# Patient Record
Sex: Male | Born: 1955 | Race: White | Hispanic: No | Smoking: Current every day smoker
Health system: Southern US, Community
[De-identification: ages and names within clinical notes are randomized; demographics above are authoritative.]

## PROBLEM LIST (undated history)

## (undated) DIAGNOSIS — E669 Obesity, unspecified: Secondary | ICD-10-CM

## (undated) DIAGNOSIS — Z720 Tobacco use: Secondary | ICD-10-CM

## (undated) DIAGNOSIS — I219 Acute myocardial infarction, unspecified: Secondary | ICD-10-CM

## (undated) DIAGNOSIS — R350 Frequency of micturition: Secondary | ICD-10-CM

## (undated) DIAGNOSIS — M549 Dorsalgia, unspecified: Secondary | ICD-10-CM

## (undated) DIAGNOSIS — Z8701 Personal history of pneumonia (recurrent): Secondary | ICD-10-CM

## (undated) DIAGNOSIS — J449 Chronic obstructive pulmonary disease, unspecified: Secondary | ICD-10-CM

## (undated) DIAGNOSIS — I10 Essential (primary) hypertension: Secondary | ICD-10-CM

## (undated) DIAGNOSIS — IMO0001 Reserved for inherently not codable concepts without codable children: Secondary | ICD-10-CM

## (undated) DIAGNOSIS — C4491 Basal cell carcinoma of skin, unspecified: Secondary | ICD-10-CM

## (undated) DIAGNOSIS — Z95810 Presence of automatic (implantable) cardiac defibrillator: Secondary | ICD-10-CM

## (undated) DIAGNOSIS — Z951 Presence of aortocoronary bypass graft: Secondary | ICD-10-CM

## (undated) DIAGNOSIS — E785 Hyperlipidemia, unspecified: Secondary | ICD-10-CM

## (undated) DIAGNOSIS — M109 Gout, unspecified: Secondary | ICD-10-CM

## (undated) DIAGNOSIS — I2510 Atherosclerotic heart disease of native coronary artery without angina pectoris: Secondary | ICD-10-CM

## (undated) DIAGNOSIS — F419 Anxiety disorder, unspecified: Secondary | ICD-10-CM

## (undated) DIAGNOSIS — I314 Cardiac tamponade: Secondary | ICD-10-CM

## (undated) DIAGNOSIS — I712 Thoracic aortic aneurysm, without rupture: Secondary | ICD-10-CM

## (undated) DIAGNOSIS — I255 Ischemic cardiomyopathy: Secondary | ICD-10-CM

## (undated) DIAGNOSIS — I5021 Acute systolic (congestive) heart failure: Secondary | ICD-10-CM

## (undated) DIAGNOSIS — I714 Abdominal aortic aneurysm, without rupture, unspecified: Secondary | ICD-10-CM

## (undated) HISTORY — DX: Ischemic cardiomyopathy: I25.5

## (undated) HISTORY — DX: Essential (primary) hypertension: I10

## (undated) HISTORY — DX: Acute systolic (congestive) heart failure: I50.21

## (undated) HISTORY — PX: COLONOSCOPY: SHX174

## (undated) HISTORY — PX: FOOT SURGERY: SHX648

## (undated) HISTORY — DX: Hyperlipidemia, unspecified: E78.5

## (undated) HISTORY — PX: CARDIAC CATHETERIZATION: SHX172

## (undated) HISTORY — DX: Atherosclerotic heart disease of native coronary artery without angina pectoris: I25.10

---

## 1998-12-04 HISTORY — PX: CORONARY ANGIOPLASTY: SHX604

## 1998-12-12 ENCOUNTER — Inpatient Hospital Stay (HOSPITAL_COMMUNITY): Admission: EM | Admit: 1998-12-12 | Discharge: 1998-12-15 | Payer: Self-pay | Admitting: Emergency Medicine

## 2013-12-04 HISTORY — PX: CORONARY ANGIOPLASTY WITH STENT PLACEMENT: SHX49

## 2013-12-11 ENCOUNTER — Emergency Department (HOSPITAL_COMMUNITY): Payer: Medicaid Other

## 2013-12-11 ENCOUNTER — Inpatient Hospital Stay (HOSPITAL_COMMUNITY)
Admission: EM | Admit: 2013-12-11 | Discharge: 2013-12-14 | DRG: 246 | Disposition: A | Discharge status: Home / Self Care | Payer: Medicaid Other | Attending: Cardiology | Admitting: Cardiology

## 2013-12-11 ENCOUNTER — Encounter (HOSPITAL_COMMUNITY): Payer: Self-pay | Admitting: Cardiology

## 2013-12-11 ENCOUNTER — Inpatient Hospital Stay (HOSPITAL_COMMUNITY): Payer: Medicaid Other

## 2013-12-11 ENCOUNTER — Encounter (HOSPITAL_COMMUNITY)
Admission: EM | Disposition: A | Discharge status: Home / Self Care | Payer: Self-pay | Source: Home / Self Care | Attending: Cardiology

## 2013-12-11 DIAGNOSIS — T50995A Adverse effect of other drugs, medicaments and biological substances, initial encounter: Secondary | ICD-10-CM | POA: Diagnosis not present

## 2013-12-11 DIAGNOSIS — R079 Chest pain, unspecified: Secondary | ICD-10-CM | POA: Diagnosis present

## 2013-12-11 DIAGNOSIS — N183 Chronic kidney disease, stage 3 unspecified: Secondary | ICD-10-CM

## 2013-12-11 DIAGNOSIS — N179 Acute kidney failure, unspecified: Secondary | ICD-10-CM | POA: Diagnosis not present

## 2013-12-11 DIAGNOSIS — I213 ST elevation (STEMI) myocardial infarction of unspecified site: Secondary | ICD-10-CM

## 2013-12-11 DIAGNOSIS — I2589 Other forms of chronic ischemic heart disease: Secondary | ICD-10-CM | POA: Diagnosis present

## 2013-12-11 DIAGNOSIS — I509 Heart failure, unspecified: Secondary | ICD-10-CM | POA: Diagnosis present

## 2013-12-11 DIAGNOSIS — Y9210 Unspecified residential institution as the place of occurrence of the external cause: Secondary | ICD-10-CM | POA: Diagnosis not present

## 2013-12-11 DIAGNOSIS — I10 Essential (primary) hypertension: Secondary | ICD-10-CM | POA: Diagnosis not present

## 2013-12-11 DIAGNOSIS — I2510 Atherosclerotic heart disease of native coronary artery without angina pectoris: Secondary | ICD-10-CM

## 2013-12-11 DIAGNOSIS — Z91199 Patient's noncompliance with other medical treatment and regimen due to unspecified reason: Secondary | ICD-10-CM

## 2013-12-11 DIAGNOSIS — L309 Dermatitis, unspecified: Secondary | ICD-10-CM

## 2013-12-11 DIAGNOSIS — Z23 Encounter for immunization: Secondary | ICD-10-CM | POA: Diagnosis not present

## 2013-12-11 DIAGNOSIS — Z9861 Coronary angioplasty status: Secondary | ICD-10-CM | POA: Diagnosis not present

## 2013-12-11 DIAGNOSIS — I5021 Acute systolic (congestive) heart failure: Secondary | ICD-10-CM | POA: Diagnosis present

## 2013-12-11 DIAGNOSIS — E785 Hyperlipidemia, unspecified: Secondary | ICD-10-CM | POA: Diagnosis present

## 2013-12-11 DIAGNOSIS — F17200 Nicotine dependence, unspecified, uncomplicated: Secondary | ICD-10-CM | POA: Diagnosis present

## 2013-12-11 DIAGNOSIS — I219 Acute myocardial infarction, unspecified: Secondary | ICD-10-CM | POA: Diagnosis not present

## 2013-12-11 DIAGNOSIS — I2109 ST elevation (STEMI) myocardial infarction involving other coronary artery of anterior wall: Secondary | ICD-10-CM | POA: Diagnosis present

## 2013-12-11 DIAGNOSIS — N182 Chronic kidney disease, stage 2 (mild): Secondary | ICD-10-CM | POA: Diagnosis not present

## 2013-12-11 DIAGNOSIS — Z9119 Patient's noncompliance with other medical treatment and regimen: Secondary | ICD-10-CM

## 2013-12-11 DIAGNOSIS — Z6832 Body mass index (BMI) 32.0-32.9, adult: Secondary | ICD-10-CM

## 2013-12-11 DIAGNOSIS — I517 Cardiomegaly: Secondary | ICD-10-CM | POA: Diagnosis not present

## 2013-12-11 DIAGNOSIS — I5022 Chronic systolic (congestive) heart failure: Secondary | ICD-10-CM

## 2013-12-11 DIAGNOSIS — Z720 Tobacco use: Secondary | ICD-10-CM | POA: Diagnosis present

## 2013-12-11 DIAGNOSIS — I255 Ischemic cardiomyopathy: Secondary | ICD-10-CM

## 2013-12-11 DIAGNOSIS — I2582 Chronic total occlusion of coronary artery: Secondary | ICD-10-CM | POA: Diagnosis present

## 2013-12-11 DIAGNOSIS — L259 Unspecified contact dermatitis, unspecified cause: Secondary | ICD-10-CM | POA: Diagnosis not present

## 2013-12-11 HISTORY — PX: PERCUTANEOUS CORONARY STENT INTERVENTION (PCI-S): SHX5485

## 2013-12-11 HISTORY — DX: Tobacco use: Z72.0

## 2013-12-11 HISTORY — DX: Obesity, unspecified: E66.9

## 2013-12-11 HISTORY — DX: Atherosclerotic heart disease of native coronary artery without angina pectoris: I25.10

## 2013-12-11 HISTORY — PX: LEFT HEART CATHETERIZATION WITH CORONARY ANGIOGRAM: SHX5451

## 2013-12-11 LAB — COMPREHENSIVE METABOLIC PANEL
ALBUMIN: 4 g/dL (ref 3.5–5.2)
ALT: 22 U/L (ref 0–53)
ALT: 97 U/L — AB (ref 0–53)
AST: 32 U/L (ref 0–37)
AST: 587 U/L — AB (ref 0–37)
Albumin: 4.2 g/dL (ref 3.5–5.2)
Alkaline Phosphatase: 56 U/L (ref 39–117)
Alkaline Phosphatase: 56 U/L (ref 39–117)
BUN: 12 mg/dL (ref 6–23)
BUN: 8 mg/dL (ref 6–23)
CALCIUM: 9 mg/dL (ref 8.4–10.5)
CALCIUM: 8.6 mg/dL (ref 8.4–10.5)
CO2: 23 mEq/L (ref 19–32)
CO2: 20 mEq/L (ref 19–32)
CREATININE: 0.67 mg/dL (ref 0.50–1.35)
Chloride: 99 mEq/L (ref 96–112)
Chloride: 99 mEq/L (ref 96–112)
Creatinine, Ser: 0.77 mg/dL (ref 0.50–1.35)
GFR calc Af Amer: >90 mL/min (ref >90)
GFR calc non Af Amer: >90 mL/min (ref >90)
GLUCOSE: 135 mg/dL — AB (ref 70–99)
Glucose, Bld: 102 mg/dL — ABNORMAL HIGH (ref 70–99)
Potassium: 4.3 mEq/L (ref 3.7–5.3)
Potassium: 3.8 mEq/L (ref 3.7–5.3)
SODIUM: 135 meq/L — AB (ref 137–147)
Sodium: 138 mEq/L (ref 137–147)
Total Bilirubin: 0.3 mg/dL (ref 0.3–1.2)
Total Bilirubin: 0.6 mg/dL (ref 0.3–1.2)
Total Protein: 8.9 g/dL — ABNORMAL HIGH (ref 6.0–8.3)
Total Protein: 8.3 g/dL (ref 6.0–8.3)

## 2013-12-11 LAB — PROTIME-INR
INR: 1 (ref 0.00–1.49)
INR: 0.92 (ref 0.00–1.49)
PROTHROMBIN TIME: 13 s (ref 11.6–15.2)
Prothrombin Time: 12.2 seconds (ref 11.6–15.2)

## 2013-12-11 LAB — MAGNESIUM: Magnesium: 1.7 mg/dL (ref 1.5–2.5)

## 2013-12-11 LAB — POCT ACTIVATED CLOTTING TIME: ACTIVATED CLOTTING TIME: 365 s

## 2013-12-11 LAB — CBC WITH DIFFERENTIAL/PLATELET
BASOS PCT: 0 % (ref 0–1)
Basophils Absolute: 0 10*3/uL (ref 0.0–0.1)
EOS ABS: 0 10*3/uL (ref 0.0–0.7)
EOS PCT: 0 % (ref 0–5)
HCT: 48.6 % (ref 39.0–52.0)
Hemoglobin: 17.3 g/dL — ABNORMAL HIGH (ref 13.0–17.0)
LYMPHS ABS: 4.5 10*3/uL — AB (ref 0.7–4.0)
Lymphocytes Relative: 35 % (ref 12–46)
MCH: 30.9 pg (ref 26.0–34.0)
MCHC: 35.6 g/dL (ref 30.0–36.0)
MCV: 86.9 fL (ref 78.0–100.0)
Monocytes Absolute: 0.9 10*3/uL (ref 0.1–1.0)
Monocytes Relative: 7 % (ref 3–12)
Neutro Abs: 7.3 10*3/uL (ref 1.7–7.7)
Neutrophils Relative %: 57 % (ref 43–77)
PLATELETS: 236 10*3/uL (ref 150–400)
RBC: 5.59 MIL/uL (ref 4.22–5.81)
RDW: 13.6 % (ref 11.5–15.5)
WBC: 12.8 10*3/uL — ABNORMAL HIGH (ref 4.0–10.5)

## 2013-12-11 LAB — POCT I-STAT, CHEM 8
BUN: 10 mg/dL (ref 6–23)
Calcium, Ion: 1.11 mmol/L — ABNORMAL LOW (ref 1.12–1.23)
Chloride: 105 mEq/L (ref 96–112)
Creatinine, Ser: 0.7 mg/dL (ref 0.50–1.35)
Glucose, Bld: 142 mg/dL — ABNORMAL HIGH (ref 70–99)
HEMATOCRIT: 51 % (ref 39.0–52.0)
Hemoglobin: 17.3 g/dL — ABNORMAL HIGH (ref 13.0–17.0)
POTASSIUM: 3.8 meq/L (ref 3.7–5.3)
SODIUM: 139 meq/L (ref 137–147)
TCO2: 19 mmol/L (ref 0–100)

## 2013-12-11 LAB — CBC
HCT: 48.4 % (ref 39.0–52.0)
Hemoglobin: 16.3 g/dL (ref 13.0–17.0)
MCH: 29.6 pg (ref 26.0–34.0)
MCHC: 33.7 g/dL (ref 30.0–36.0)
MCV: 88 fL (ref 78.0–100.0)
PLATELETS: 267 10*3/uL (ref 150–400)
RBC: 5.5 MIL/uL (ref 4.22–5.81)
RDW: 13.6 % (ref 11.5–15.5)
WBC: 10.3 10*3/uL (ref 4.0–10.5)

## 2013-12-11 LAB — GLUCOSE, CAPILLARY
GLUCOSE-CAPILLARY: 143 mg/dL — AB (ref 70–99)
Glucose-Capillary: 106 mg/dL — ABNORMAL HIGH (ref 70–99)

## 2013-12-11 LAB — HEMOGLOBIN A1C
HEMOGLOBIN A1C: 5.8 % — AB (ref <5.7)
Mean Plasma Glucose: 120 mg/dL — ABNORMAL HIGH (ref <117)

## 2013-12-11 LAB — TROPONIN I

## 2013-12-11 LAB — PRO B NATRIURETIC PEPTIDE: PRO B NATRI PEPTIDE: 750.4 pg/mL — AB (ref 0–125)

## 2013-12-11 LAB — APTT: aPTT: 30 seconds (ref 24–37)

## 2013-12-11 LAB — MRSA PCR SCREENING: MRSA by PCR: NEGATIVE

## 2013-12-11 SURGERY — LEFT HEART CATHETERIZATION WITH CORONARY ANGIOGRAM
Anesthesia: LOCAL

## 2013-12-11 MED ORDER — NITROGLYCERIN IN D5W 200-5 MCG/ML-% IV SOLN: 3.0000 ug/min | INTRAVENOUS | Status: DC

## 2013-12-11 MED ORDER — BIVALIRUDIN 250 MG IV SOLR
INTRAVENOUS | Status: AC
  Filled 2013-12-11: qty 250

## 2013-12-11 MED ORDER — LIDOCAINE HCL (PF) 1 % IJ SOLN
INTRAMUSCULAR | Status: AC
  Filled 2013-12-11: qty 30

## 2013-12-11 MED ORDER — ALPRAZOLAM 0.25 MG PO TABS
0.2500 mg | ORAL_TABLET | Freq: Two times a day (BID) | ORAL | Status: DC | PRN
  Administered 2013-12-11: 0.25 mg via ORAL

## 2013-12-11 MED ORDER — TICAGRELOR 90 MG PO TABS
ORAL_TABLET | ORAL | Status: AC
  Filled 2013-12-11: qty 1

## 2013-12-11 MED ORDER — NITROGLYCERIN 0.2 MG/ML ON CALL CATH LAB
INTRAVENOUS | Status: AC
  Filled 2013-12-11: qty 1

## 2013-12-11 MED ORDER — TICAGRELOR 90 MG PO TABS
90.0000 mg | ORAL_TABLET | Freq: Two times a day (BID) | ORAL | Status: DC
  Administered 2013-12-11 – 2013-12-14 (×6): 90 mg via ORAL
  Filled 2013-12-11 (×8): qty 1

## 2013-12-11 MED ORDER — ALPRAZOLAM 0.25 MG PO TABS
0.2500 mg | ORAL_TABLET | Freq: Three times a day (TID) | ORAL | Status: DC | PRN
  Administered 2013-12-11: 0.25 mg via ORAL
  Filled 2013-12-11: qty 1

## 2013-12-11 MED ORDER — HEPARIN SODIUM (PORCINE) 5000 UNIT/ML IJ SOLN
5000.0000 [IU] | Freq: Three times a day (TID) | INTRAMUSCULAR | Status: DC
Start: 2013-12-11 — End: 2013-12-14
  Administered 2013-12-11 – 2013-12-14 (×7): 5000 [IU] via SUBCUTANEOUS
  Filled 2013-12-11 (×11): qty 1

## 2013-12-11 MED ORDER — SODIUM CHLORIDE 0.9 % IV SOLN: INTRAVENOUS | Status: DC

## 2013-12-11 MED ORDER — MIDAZOLAM HCL 2 MG/2ML IJ SOLN
INTRAMUSCULAR | Status: AC
  Filled 2013-12-11: qty 2

## 2013-12-11 MED ORDER — MORPHINE SULFATE 10 MG/ML IJ SOLN
2.0000 mg | INTRAMUSCULAR | Status: DC | PRN
  Administered 2013-12-11 (×3): 2 mg via INTRAVENOUS

## 2013-12-11 MED ORDER — SODIUM CHLORIDE 0.9 % IV SOLN
0.2500 mg/kg/h | INTRAVENOUS | Status: DC
  Filled 2013-12-11: qty 250

## 2013-12-11 MED ORDER — FUROSEMIDE 10 MG/ML IJ SOLN
INTRAMUSCULAR | Status: AC
  Filled 2013-12-11: qty 4

## 2013-12-11 MED ORDER — HYDRALAZINE HCL 20 MG/ML IJ SOLN
10.0000 mg | INTRAMUSCULAR | Status: DC | PRN
  Administered 2013-12-11 (×2): 10 mg via INTRAVENOUS
  Filled 2013-12-11 (×2): qty 1

## 2013-12-11 MED ORDER — TICAGRELOR 90 MG PO TABS
ORAL_TABLET | ORAL | Status: AC
Start: 2013-12-11 — End: 2013-12-11
  Filled 2013-12-11: qty 1

## 2013-12-11 MED ORDER — ALPRAZOLAM 0.25 MG PO TABS
ORAL_TABLET | ORAL | Status: AC
  Filled 2013-12-11: qty 1

## 2013-12-11 MED ORDER — METOPROLOL TARTRATE 1 MG/ML IV SOLN
INTRAVENOUS | Status: AC
  Filled 2013-12-11: qty 5

## 2013-12-11 MED ORDER — ATORVASTATIN CALCIUM 80 MG PO TABS
80.0000 mg | ORAL_TABLET | Freq: Every day | ORAL | Status: DC
  Administered 2013-12-11 – 2013-12-13 (×3): 80 mg via ORAL
  Filled 2013-12-11 (×4): qty 1

## 2013-12-11 MED ORDER — ACETAMINOPHEN 325 MG PO TABS: 650.0000 mg | ORAL_TABLET | ORAL | Status: DC | PRN

## 2013-12-11 MED ORDER — ZOLPIDEM TARTRATE 5 MG PO TABS
5.0000 mg | ORAL_TABLET | Freq: Every evening | ORAL | Status: DC | PRN
  Administered 2013-12-11: 5 mg via ORAL
  Filled 2013-12-11: qty 1

## 2013-12-11 MED ORDER — MORPHINE SULFATE 2 MG/ML IJ SOLN
INTRAMUSCULAR | Status: AC
  Filled 2013-12-11: qty 1

## 2013-12-11 MED ORDER — ONDANSETRON HCL 4 MG/2ML IJ SOLN: 4.0000 mg | Freq: Four times a day (QID) | INTRAMUSCULAR | Status: DC | PRN

## 2013-12-11 MED ORDER — ASPIRIN 81 MG PO CHEW
324.0000 mg | CHEWABLE_TABLET | Freq: Once | ORAL | Status: AC
  Administered 2013-12-11: 324 mg via ORAL

## 2013-12-11 MED ORDER — HEPARIN SODIUM (PORCINE) 5000 UNIT/ML IJ SOLN: 5000.0000 [IU] | Freq: Three times a day (TID) | INTRAMUSCULAR | Status: DC

## 2013-12-11 MED ORDER — HEPARIN SODIUM (PORCINE) 5000 UNIT/ML IJ SOLN
60.0000 [IU]/kg | INTRAMUSCULAR | Status: DC
  Administered 2013-12-11: 4000 [IU] via INTRAVENOUS

## 2013-12-11 MED ORDER — LISINOPRIL 5 MG PO TABS
5.0000 mg | ORAL_TABLET | Freq: Every day | ORAL | Status: DC
  Administered 2013-12-11 – 2013-12-14 (×4): 5 mg via ORAL
  Filled 2013-12-11 (×4): qty 1

## 2013-12-11 MED ORDER — MIDAZOLAM HCL 2 MG/2ML IJ SOLN
INTRAMUSCULAR | Status: AC
Start: 2013-12-11 — End: 2013-12-11
  Filled 2013-12-11: qty 2

## 2013-12-11 MED ORDER — SODIUM CHLORIDE 0.9 % IV SOLN
1.0000 mL/kg/h | INTRAVENOUS | Status: AC
Start: 2013-12-11 — End: 2013-12-11

## 2013-12-11 MED ORDER — ASPIRIN 81 MG PO CHEW
81.0000 mg | CHEWABLE_TABLET | Freq: Every day | ORAL | Status: DC
  Administered 2013-12-12 – 2013-12-14 (×3): 81 mg via ORAL
  Filled 2013-12-11 (×3): qty 1

## 2013-12-11 MED ORDER — FENTANYL CITRATE 0.05 MG/ML IJ SOLN
INTRAMUSCULAR | Status: AC
  Filled 2013-12-11: qty 2

## 2013-12-11 MED ORDER — FAMOTIDINE IN NACL 20-0.9 MG/50ML-% IV SOLN: 20.0000 mg | Freq: Once | INTRAVENOUS | Status: DC

## 2013-12-11 MED ORDER — TICAGRELOR 90 MG PO TABS: 90.0000 mg | ORAL_TABLET | Freq: Two times a day (BID) | ORAL | Status: DC

## 2013-12-11 MED ORDER — MORPHINE SULFATE 2 MG/ML IJ SOLN
2.0000 mg | INTRAMUSCULAR | Status: DC | PRN
  Administered 2013-12-11 – 2013-12-12 (×2): 2 mg via INTRAVENOUS
  Filled 2013-12-11: qty 1

## 2013-12-11 MED ORDER — PNEUMOCOCCAL VAC POLYVALENT 25 MCG/0.5ML IJ INJ
0.5000 mL | INJECTION | INTRAMUSCULAR | Status: AC
  Administered 2013-12-12: 0.5 mL via INTRAMUSCULAR
  Filled 2013-12-11: qty 0.5

## 2013-12-11 MED ORDER — NITROGLYCERIN IN D5W 200-5 MCG/ML-% IV SOLN
INTRAVENOUS | Status: AC
  Filled 2013-12-11: qty 250

## 2013-12-11 MED ORDER — HEPARIN (PORCINE) IN NACL 2-0.9 UNIT/ML-% IJ SOLN
INTRAMUSCULAR | Status: AC
  Filled 2013-12-11: qty 1000

## 2013-12-11 MED ORDER — MORPHINE SULFATE 2 MG/ML IJ SOLN
INTRAMUSCULAR | Status: AC
  Administered 2013-12-11: 2 mg via INTRAVENOUS
  Filled 2013-12-11: qty 1

## 2013-12-11 MED ORDER — SODIUM CHLORIDE 0.9 % IV SOLN
0.2500 mg/kg/h | INTRAVENOUS | Status: DC
  Administered 2013-12-11: 0.25 mg/kg/h via INTRAVENOUS
  Filled 2013-12-11: qty 250

## 2013-12-11 MED ORDER — VERAPAMIL HCL 2.5 MG/ML IV SOLN
INTRAVENOUS | Status: AC
  Filled 2013-12-11: qty 2

## 2013-12-11 MED ORDER — FUROSEMIDE 10 MG/ML IJ SOLN
40.0000 mg | Freq: Once | INTRAMUSCULAR | Status: AC
  Administered 2013-12-11: 40 mg via INTRAVENOUS

## 2013-12-11 MED ORDER — ASPIRIN EC 81 MG PO TBEC: 81.0000 mg | DELAYED_RELEASE_TABLET | Freq: Every day | ORAL | Status: DC

## 2013-12-11 MED ORDER — PANTOPRAZOLE SODIUM 40 MG PO TBEC
40.0000 mg | DELAYED_RELEASE_TABLET | Freq: Every day | ORAL | Status: DC
  Administered 2013-12-12 – 2013-12-14 (×3): 40 mg via ORAL
  Filled 2013-12-11 (×4): qty 1

## 2013-12-11 MED ORDER — NITROGLYCERIN IN D5W 200-5 MCG/ML-% IV SOLN
5.0000 ug/min | INTRAVENOUS | Status: DC
  Administered 2013-12-11: 20 ug/min via INTRAVENOUS

## 2013-12-11 MED ORDER — ONDANSETRON HCL 4 MG/2ML IJ SOLN
4.0000 mg | Freq: Four times a day (QID) | INTRAMUSCULAR | Status: DC | PRN
Start: 2013-12-11 — End: 2013-12-14
  Administered 2013-12-11: 4 mg via INTRAVENOUS
  Filled 2013-12-11: qty 2

## 2013-12-11 MED ORDER — ASPIRIN 81 MG PO CHEW
CHEWABLE_TABLET | ORAL | Status: AC
  Filled 2013-12-11: qty 4

## 2013-12-11 MED ORDER — METOPROLOL TARTRATE 1 MG/ML IV SOLN
5.0000 mg | Freq: Once | INTRAVENOUS | Status: AC
  Administered 2013-12-11: 5 mg via INTRAVENOUS

## 2013-12-11 MED ORDER — NITROGLYCERIN 0.4 MG SL SUBL: 0.4000 mg | SUBLINGUAL_TABLET | SUBLINGUAL | Status: DC | PRN

## 2013-12-11 MED ORDER — CARVEDILOL 6.25 MG PO TABS
6.2500 mg | ORAL_TABLET | Freq: Two times a day (BID) | ORAL | Status: DC
  Administered 2013-12-11 – 2013-12-14 (×6): 6.25 mg via ORAL
  Filled 2013-12-11 (×8): qty 1

## 2013-12-11 MED ORDER — HEPARIN SODIUM (PORCINE) 5000 UNIT/ML IJ SOLN
INTRAMUSCULAR | Status: AC
  Filled 2013-12-11: qty 1

## 2013-12-11 NOTE — Progress Notes (Signed)
12/11/2013 1757  Dr. Barbee Shropshire aware of elevated trop. >20  Pt admitted with STEMI  Kerry Mason, Carolynn Comment

## 2013-12-11 NOTE — H&P (Signed)
Physician History and Physical    Patient ID: Kerry Mason MRN: [id number] DOB/AGE: Dec 07, 1955 58 y.o. Admit date: 12/11/2013  Primary Care Physician: N/A Primary Cardiologist: N/A  HPI:  Kerry Mason is a 58 year old white male admitted with an anterior STEMI. He has a history of coronary disease and is status post stenting of the mid right coronary in 2000 with a bare-metal stent. He has had no cardiac or medical followup since then. He states he has been on no medications including aspirin. He continues to smoke one pack per day. He denies any history of diabetes, hypertension, hypercholesterolemia. He is sedentary. This morning at 3 AM he awoke with severe chest pain. It radiated between his shoulder blades and up to the shoulders. He had one brief episode of sweating. He denied shortness of breath. Initially he refused to come to the emergency room but later allowed his girlfriend to drive him to the emergency department. On arrival here his ECG demonstrated an anterior STEMI. He had 5-6 mm of ST elevation in the anterior leads. Patient denies history of alcohol or illicit drug use.  Review of systems complete and found to be negative unless listed above  Past Medical History  Diagnosis Date  . CAD (coronary artery disease)     s/p stent of RCA 2000 with BMS    Family History  Problem Relation Age of Onset  . CAD Father     PTCA    History   Social History  . Marital Status: Divorced    Spouse Name: N/A    Number of Children: 2  . 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 Smoking status: Current Every Day Smoker -- 1.00 packs/day    Types: Cigarettes  . Smokeless tobacco: Not on file  . Alcohol Use: No  . Drug Use: No  . Sexual Activity: Not on file   Other Topics Concern  . Not on file   Social History Narrative  . No narrative on file    No past surgical history on file.   No prescriptions prior  to admission    Physical Exam: Blood pressure 180/110, pulse 109, temperature 98.1 F (36.7 C), temperature source Oral, height 6\' 2"  (1.88 m), weight 250 lb (113.399 kg), SpO2 99.00%.  He is an overweight white male in moderate distress. HEENT: Normocephalic, atraumatic. Pupils equal round and reactive to light accommodation. Sclera are clear. Oropharynx is clear with good dentition. Neck: No JVD or bruits. No adenopathy or thyromegaly. Lungs: Clear Cardiovascular: Regular rate and rhythm. Normal S1 and S2. No gallop, murmur, or click. Abdomen: Obese, soft, nontender. Bowel sounds are positive. No hepatosplenomegaly or masses. Extremities: No cyanosis or edema. Pulses are 2+ and symmetric throughout. Skin: Warm and dry Neuro: Alert and oriented x3. Cranial nerves II through XII are intact. Labs:   Lab Results  Component Value Date   WBC 10.3 12/11/2013   HGB 16.3 12/11/2013   HCT 48.4 12/11/2013   MCV 88.0 12/11/2013   PLT 267 12/11/2013     Recent Labs Lab 12/11/13 0828  NA 138  K 4.3  CL 99  CO2 23  BUN 12  CREATININE 0.77  CALCIUM 9.0  PROT 8.9*  BILITOT 0.3  ALKPHOS 56  ALT 22  AST 32  GLUCOSE 135*       Radiology: Pending EKG: Normal sinus rhythm with ST elevation throughout the anterior lateral leads up to  5-6 mm in the anterior precordial leads. There is reciprocal ST depression inferiorly.  ASSESSMENT AND PLAN:  1. Anterior STEMI. The patient presents 6 hours after onset of his pain. He has a history of noncompliance with medical followup. He does have a prior history of coronary disease with stenting of the right coronary. I recommended emergent cardiac catheterization with PCI.The procedure and risks were reviewed including but not limited to death, myocardial infarction, stroke, arrythmias, bleeding, transfusion, emergency surgery, dye allergy, or renal dysfunction. The patient voices understanding and is agreeable to proceed. Patient was treated initially with oral  aspirin and IV heparin bolus. Further management pending the results of this cardiac catheterization.  2. Coronary disease with remote stenting of the right coronary.  3. Tobacco abuse. Patient will be counseled on the importance of smoking cessation.  4. Obesity.  SignedCollier Salina [facility] 12/11/2013, 12:00 PM

## 2013-12-11 NOTE — ED Provider Notes (Signed)
CSN: [id number]     Arrival date & time 12/11/13  0818 History   First MD Initiated Contact with Patient 12/11/13 [phone]     Chief Complaint  Patient presents with  . Chest Pain  . Code STEMI   (Consider location/radiation/quality/duration/timing/severity/associated sxs/prior Treatment) Patient is a 58 y.o. male presenting with chest pain.  Chest Pain Pain location:  Substernal area Pain quality: pressure   Pain radiates to:  Does not radiate Pain radiates to the back: yes   Pain severity:  Severe Onset quality:  Sudden Duration:  5 hours Timing:  Constant Progression:  Partially resolved Chronicity:  New Context: at rest (from sleeping at 3 am)   Relieved by:  Nitroglycerin Associated symptoms: diaphoresis, nausea and vomiting   Associated symptoms: no abdominal pain, no back pain, no cough, no fever, no headache and no shortness of breath   Risk factors: coronary artery disease (hx of prior stenting)     No past medical history on file. No past surgical history on file. No family history on file. History  Substance Use Topics  . Smoking status: Not on file  . Smokeless tobacco: Not on file  . Alcohol Use: Not on file    Review of Systems  Constitutional: Positive for diaphoresis. Negative for fever and chills.  HENT: Negative for sore throat.   Eyes: Negative for pain.  Respiratory: Negative for cough and shortness of breath.   Cardiovascular: Positive for chest pain.  Gastrointestinal: Positive for nausea and vomiting. Negative for abdominal pain.  Genitourinary: Negative for dysuria and flank pain.  Musculoskeletal: Negative for back pain and neck pain.  Skin: Negative for rash.  Neurological: Negative for seizures and headaches.    Allergies  Review of patient's allergies indicates not on file.  Home Medications  No current outpatient prescriptions on file. BP 180/110  Pulse 109  Temp(Src) 98.1 F (36.7 C) (Oral)  Ht 6\' 2"  (1.88 m)  Wt 250 lb (113.399  kg)  BMI 32.08 kg/m2  SpO2 99% Physical Exam  Constitutional: He is oriented to person, place, and time. He appears well-developed and well-nourished. No distress.  HENT:  Head: Normocephalic and atraumatic.  Eyes: Pupils are equal, round, and reactive to light.  Neck: Normal range of motion.  Cardiovascular: Regular rhythm.  Tachycardia present.   Pulmonary/Chest: Effort normal and breath sounds normal.  Abdominal: Soft. He exhibits no distension. There is no tenderness.  Musculoskeletal: Normal range of motion.  Neurological: He is alert and oriented to person, place, and time.  Skin: Skin is warm. He is not diaphoretic.    ED Course  Procedures (including critical care time) Labs Review Labs Reviewed  COMPREHENSIVE METABOLIC PANEL - Abnormal; Notable for the following:    Glucose, Bld 135 (*)    Total Protein 8.9 (*)    All other components within normal limits  APTT  CBC  PROTIME-INR   Imaging Review No results found.  EKG Interpretation    Date/Time:  Thursday December 11 2013 08:19:36 EST Ventricular Rate:  95 PR Interval:    QRS Duration: 90 QT Interval:  446 QTC Calculation: 560 R Axis:   91 Text Interpretation:  Accelerated Junctional rhythm Rightward axis Anterior infarct , possibly acute Lateral injury pattern Prolonged QT ** ** ACUTE MI / STEMI ** ** Abnormal ECG Reconfirmed by DOCHERTY  MD, MEGAN ([id number]) on 12/11/2013 8:46:46 AM            MDM   1. STEMI (ST elevation myocardial infarction)  Kerry Mason is a 58 y.o. male who presents to the ED with chest pain. EKG demonstrates ACUTE MI - EKG interpretation as above.   CODE STEMI called immediately. Patient immediately evaluated by myself and by the attending. Patient took nitro prior to arrival. Patient took unspecified amount of aspirin, will redose 325 aspirin. Heparin bolus ordered. Cath lab immediately ready for patient. BP stable upon arrival. VS: 180/110, HR 105.   Patient admitted to  cath lab in critical condition. Patient seen and evaluated by myself and my attending, Dr. Tawnya Crook.      Freddi Che, MD 12/11/13 1110

## 2013-12-11 NOTE — CV Procedure (Signed)
    Cardiac Catheterization Procedure Note  Name: Kerry Mason MRN: [id number] DOB: 1956/08/25  Procedure: Left Heart Cath, Selective Coronary Angiography, LV angiography, PTCA and stenting of the proximal and mid LAD.  Indication: 57 yo WM with history of CAD s/p BMS of the mid RCA in 2000 presents with an acute anterior STEMI with 5-6 mm ST elevation in the anterior precordial leads.  Procedural Details:  The right wrist was prepped, draped, and anesthetized with 1% lidocaine. Using the modified Seldinger technique, a 6 French sheath was introduced into the right radial artery. 3 mg of verapamil was administered through the sheath, IV bivalirudin was administered intravenously. Standard Judkins catheters were used for selective coronary angiography and left ventriculography. Catheter exchanges were performed over an exchange length guidewire.  PROCEDURAL FINDINGS Hemodynamics: AO 144/95 mean 116 mm Hg LV 151/25 mm Hg   Coronary angiography: Coronary dominance: right  Left mainstem: The left main is large with bulky eccentric 50% stenosis distally.   Left anterior descending (LAD): 100% occluded proximally. Moderate calcification.  Ramus intermediate: large bifurcating vessel. Normal.   Left circumflex (LCx): Small, normal.   Right coronary artery (RCA): 100% occluded proximally with left to right and right to right collaterals.  Left ventriculography: Left ventricular systolic function is abnormal, There is severe hypokinesis of the anterior wall, apex, and distal inferior wall. LVEF is estimated at 35%, there is no significant mitral regurgitation   PCI Note:  Following the diagnostic procedure, the decision was made to proceed with PCI. Based on his Ecg findings I felt the LAD was his culprit and the RCA occlusion was chronic.  Weight-based bivalirudin was given for anticoagulation. Brilinta 180 mg was given orally.Once a therapeutic ACT was achieved, a 6 [place] XBLAD 3.5 guide  catheter was inserted.  A prowater coronary guidewire was used to cross the lesion.  The lesion was predilated with a 2.5 mm balloon. With reperfusion the LAD was noted to be a very large vessel with a long segment of disease in the proximal vessel. There was also a focal 80-90% stenosis in the mid vessel.The proximal lesion was then stented with a 4.0 x38 mm Promus stent.  The mid vessel was stented with a 3.0 x 16 mm Promus stent. Following PCI, there was 0% residual stenosis and TIMI-3 flow. Final angiography confirmed an excellent result. The patient tolerated the procedure well and was pain free at its conclusion. There were no immediate procedural complications. A TR band was used for radial hemostasis. The patient was transferred to the post catheterization recovery area for further monitoring.  PCI Data: Vessel - LAD/Segment - proximal Percent Stenosis (pre)  100% TIMI-flow 0 Stent 4.0 x 38 mm Promus proximal, 3.0 x 16 mm Promus mid. Percent Stenosis (post) 0% TIMI-flow (post) 3  Final Conclusions:   1. Severe 2 vessel occlusive CAD. Culprit is occlusion of the proximal LAD. Chronic occlusion of the RCA with collaterals. 2. Moderate to severe LV dysfunction.  3. Successful stenting of the proximal and mid LAD with DES.  Recommendations:  Dual antiplatelet therapy for at least one year. Patient will be monitored in ICU. Continue IV Ntg for now. Add beta blocker, statin and ACEi. Will need Echo prior to DC.  Kerry Mason [facility] 12/11/2013, 9:52 AM

## 2013-12-11 NOTE — ED Notes (Signed)
Awoke with SSCP radiating through to back between shoulder blades. Took NTG x3 without relief but stated they were old & expired. Took NTG X2 from a neighbor with relief. Denies CP presently, only upper back pain.

## 2013-12-11 NOTE — ED Provider Notes (Signed)
Medical screening examination/treatment/procedure(s) were conducted as a shared visit with resident-physician practitioner(s) and myself.  I personally evaluated the patient during the encounter.  Pt is a 58 y.o. male with pmhx as above presenting with chest tightness, back pain, nausea, SOB since around 3am. Pt took 3 of his expired NTG, then two of a neighbors, 1/2 an ASA before presenting to ED.  Pt found to have STEMI on triage EKG w/ STE in high lateral and precordial leads, reciprocal depression in III, aVF. Code STEMI activated. On PE, Pt midly tachycardic, in no resp distress, lungs and heart sounds nml.  Pulses equal in all 4 extremities.  Abdomen benign. No LE pain/edema.  No hx of recent bleeding, CVA, trauma. ASA, heparin bolus given. Pt taken to cath lab.   1. STEMI (ST elevation myocardial infarction)      Medical screening examination/treatment/procedure(s) were performed by non-physician practitioner and as supervising physician I was immediately available for consultation/collaboration.  EKG Interpretation    Date/Time:  Thursday December 11 2013 08:19:36 EST Ventricular Rate:  95 PR Interval:    QRS Duration: 90 QT Interval:  446 QTC Calculation: 560 R Axis:   91 Text Interpretation:  Accelerated Junctional rhythm Rightward axis Anterior infarct , possibly acute Lateral injury pattern Prolonged QT ** ** ACUTE MI / STEMI ** ** Abnormal ECG Reconfirmed by Louanna Vanliew  MD, Margaree Sandhu ([id number]) on 12/11/2013 8:46:46 AM           .     Neta Ehlers, MD 12/11/13 [phone]

## 2013-12-11 NOTE — ED Notes (Signed)
EDMD at bedside

## 2013-12-11 NOTE — Progress Notes (Signed)
Utilization Review Completed.Donne Anon T1/07/2014

## 2013-12-11 NOTE — ED Notes (Signed)
Transported to cath lab with nurse on [place]

## 2013-12-11 NOTE — Progress Notes (Signed)
Chaplain responded to code stemi, meeting pt's girlfriend in the cath lab waiting area. Chaplain informed medical team in cath lab of girlfriend's presence in waiting area and passed on the news that he was currently stable. Pt's girlfriend was in contact with pt's two children. She expressed financial concerns. Chaplain recommended inquiring with pt's RN once he is on a unit as to hospital resources - social work, financial advising, Social research officer, government. She was very Environmental education officer support and presence. Please page for follow up.   [place], [place]

## 2013-12-11 NOTE — ED Notes (Signed)
Pt with hx of stent to ED c/o awaking from sleep at 3 am with pressure to both arms and center of chest that radiates to central back.

## 2013-12-11 NOTE — Progress Notes (Signed)
Patient has had persistent chest burning post PCI. Pain in shoulders and back resolved. Ecg post PCI improved but still shows significant residual ST elevation and Q waves anteriorly. Patient remains hypertensive despite IV Ntg and lopressor. Lungs are clear. No murmur or rub on exam. Will give IV lasix 40 mg now. Apresoline ordered prn for BP. Will check CXR now. Antireflux therapy added.   Peter Martinique MD, [facility] 12/11/2013 3:24 PM

## 2013-12-11 NOTE — ED Notes (Signed)
Pt's clothing & belongings placed in bags & given to his girlfriend

## 2013-12-12 DIAGNOSIS — I517 Cardiomegaly: Secondary | ICD-10-CM

## 2013-12-12 DIAGNOSIS — E785 Hyperlipidemia, unspecified: Secondary | ICD-10-CM

## 2013-12-12 DIAGNOSIS — N183 Chronic kidney disease, stage 3 unspecified: Secondary | ICD-10-CM

## 2013-12-12 DIAGNOSIS — I219 Acute myocardial infarction, unspecified: Secondary | ICD-10-CM

## 2013-12-12 LAB — BASIC METABOLIC PANEL
BUN: 12 mg/dL (ref 6–23)
CHLORIDE: 99 meq/L (ref 96–112)
CO2: 22 meq/L (ref 19–32)
Calcium: 8.7 mg/dL (ref 8.4–10.5)
Creatinine, Ser: 1.72 mg/dL — ABNORMAL HIGH (ref 0.50–1.35)
GFR calc Af Amer: 49 mL/min — ABNORMAL LOW (ref >90)
GFR calc non Af Amer: 42 mL/min — ABNORMAL LOW (ref >90)
Glucose, Bld: 118 mg/dL — ABNORMAL HIGH (ref 70–99)
POTASSIUM: 3.9 meq/L (ref 3.7–5.3)
Sodium: 134 mEq/L — ABNORMAL LOW (ref 137–147)

## 2013-12-12 LAB — CBC
HCT: 45.8 % (ref 39.0–52.0)
Hemoglobin: 16.3 g/dL (ref 13.0–17.0)
MCH: 31.1 pg (ref 26.0–34.0)
MCHC: 35.6 g/dL (ref 30.0–36.0)
MCV: 87.4 fL (ref 78.0–100.0)
PLATELETS: 247 10*3/uL (ref 150–400)
RBC: 5.24 MIL/uL (ref 4.22–5.81)
RDW: 13.7 % (ref 11.5–15.5)
WBC: 12.5 10*3/uL — AB (ref 4.0–10.5)

## 2013-12-12 LAB — GLUCOSE, CAPILLARY
Glucose-Capillary: 111 mg/dL — ABNORMAL HIGH (ref 70–99)
Glucose-Capillary: 90 mg/dL (ref 70–99)

## 2013-12-12 LAB — LIPID PANEL
CHOL/HDL RATIO: 5.7 ratio
CHOLESTEROL: 160 mg/dL (ref 0–200)
HDL: 28 mg/dL — AB (ref >39)
LDL Cholesterol: 110 mg/dL — ABNORMAL HIGH (ref 0–99)
Triglycerides: 109 mg/dL (ref <150)
VLDL: 22 mg/dL (ref 0–40)

## 2013-12-12 LAB — TROPONIN I: Troponin I: >20 ng/mL (ref <0.30)

## 2013-12-12 LAB — TSH: TSH: 1.265 u[IU]/mL (ref 0.350–4.500)

## 2013-12-12 NOTE — Progress Notes (Signed)
Report called to nurse on 3W. Pt stable up walking in room demies chest pain.

## 2013-12-12 NOTE — Progress Notes (Signed)
  Echocardiogram 2D Echocardiogram has been performed.  Kerry Mason 12/12/2013, 5:31 PM

## 2013-12-12 NOTE — Care Management Note (Signed)
    Page 1 of 1   12/12/2013     9:31:36 AM   CARE MANAGEMENT NOTE 12/12/2013  Patient:  FAIZ, WEBER   Account Number:  [id number]  Date Initiated:  12/11/2013  Documentation initiated by:  Elissa Hefty  Subjective/Objective Assessment:   adm w stemi     Action/Plan:   lives w wife   Anticipated DC Date:     Anticipated DC Plan:  [place]  CM consult  Medication [facility]      Choice offered to / List presented to:             Status of service:   Medicare Important Message given?   (If response is "NO", the following Medicare IM given date fields will be blank) Date Medicare IM given:   Date Additional Medicare IM given:    Discharge Disposition:    Per UR Regulation:  Reviewed for med. necessity/level of care/duration of stay  If discussed at Long Length of Stay Meetings, dates discussed:    Comments:  1/9 0929 debbie Laiklynn Raczynski rn,bsn spoke w pt and son. gave pt brilinta 30day free card. placed brilinta pt assist form on shadow chart. no ins at present. will send inform to [facility] to try and get pt post hosp appt also. gave pt prescription discount card that may help w brand name meds.

## 2013-12-12 NOTE — Progress Notes (Addendum)
PROGRESS NOTE  Cardiologist:  Dr. Peter Martinique  Subjective:   Kerry Mason is a 58 yo with hx of prior CAD ( BMS to mid RCA in 2000)  admitted to [facility] with an anterior STEMI.   Cath revealed an occluded LAD.   A 4.0 x 38 Promus stent was placed in the proximal LAD and a 3.0 x 16 Promus stent was placed in the mid LAD.  He has L to R  and R to R collateral filling of the chronically occluded RCA.  His LV ef is moderately reduced with EF of 35%.  His creatinine is elevated this am compared to  admission creatinine.   Objective:    Vital Signs:   Temp:  [97.9 F (36.6 C)-98.8 F (37.1 C)] 97.9 F (36.6 C) (01/09 0800) Pulse Rate:  [76-103] 86 (01/09 0900) Resp:  [13-28] 22 (01/09 0900) BP: (64-184)/(31-123) 97/45 mmHg (01/09 0900) SpO2:  [91 %-97 %] 95 % (01/09 0900) Weight:  [253 lb 1.4 oz (114.8 kg)] 253 lb 1.4 oz (114.8 kg) (01/09 0454)  Last BM Date: 12/11/13   24-hour weight change: Weight change:   Weight trends: Filed Weights   12/11/13 0835 12/12/13 0454  Weight: 250 lb (113.399 kg) 253 lb 1.4 oz (114.8 kg)    Intake/Output:  01/08 0701 - 01/09 0700 In: 566.1 [P.O.:360; I.V.:206.1] Out: 3550 [Urine:3550] Total I/O In: 18 [I.V.:18] Out: -    Physical Exam: BP 97/45  Pulse 86  Temp(Src) 97.9 F (36.6 C) (Oral)  Resp 22  Ht 6\' 2"  (1.88 m)  Wt 253 lb 1.4 oz (114.8 kg)  BMI 32.48 kg/m2  SpO2 95%  General: Vital signs reviewed and noted.   Head: Normocephalic, atraumatic.  Eyes: conjunctivae/corneas clear.  EOM's intact.   Throat: normal  Neck:  normal   Lungs:  Clear   Heart:  RR, normal S1, S2  Abdomen:  Soft, non-tender, non-distended    Extremities: Right radial cath site looks good    Neurologic: A&O X3, CN II - XII are grossly intact.   Psych: Normal     Labs: BMET:  Recent Labs  12/11/13 0852 12/11/13 1645 12/12/13 0212  NA 139 135* 134*  K 3.8 3.8 3.9  CL 105 99 99  CO2  --  20 22  GLUCOSE 142* 102* 118*  BUN 10 8 12     CREATININE 0.70 0.67 1.72*  CALCIUM  --  8.6 8.7  MG  --  1.7  --     Liver function tests:  Recent Labs  12/11/13 0828 12/11/13 1645  AST 32 587*  ALT 22 97*  ALKPHOS 56 56  BILITOT 0.3 0.6  PROT 8.9* 8.3  ALBUMIN 4.2 4.0   No results found for this basename: LIPASE, AMYLASE,  in the last 72 hours  CBC:  Recent Labs  12/11/13 0852 12/11/13 1645 12/12/13 0212  WBC  --  12.8* 12.5*  NEUTROABS  --  7.3  --   HGB 17.3* 17.3* 16.3  HCT 51.0 48.6 45.8  MCV  --  86.9 87.4  PLT  --  236 247    Cardiac Enzymes:  Recent Labs  12/11/13 1645 12/11/13 2215 12/12/13 0212  TROPONINI >20.00* >20.00* >20.00*    Coagulation Studies:  Recent Labs  12/11/13 0828 12/11/13 1645  LABPROT 13.0 12.2  INR 1.00 0.92    Other: No components found with this basename: POCBNP,  No results found for this basename: DDIMER,  in the  last 72 hours  Recent Labs  12/11/13 1645  HGBA1C 5.8*    Recent Labs  12/12/13 0212  CHOL 160  HDL 28*  LDLCALC 110*  TRIG 109  CHOLHDL 5.7    Recent Labs  12/11/13 1645  TSH 1.265   No results found for this basename: VITAMINB12, FOLATE, FERRITIN, TIBC, IRON, RETICCTPCT,  in the last 72 hours   Other results:  ECG:  NSR at 78.  Recent anterior septal MI.  Persistent ST elevation V2 - V5 with corresponding TWI  In V2-V6.   Medications:    Infusions: . sodium chloride    . nitroGLYCERIN 5 mcg/min (12/12/13 0800)    Scheduled Medications: . aspirin  81 mg Oral Daily  . atorvastatin  80 mg Oral q1800  . carvedilol  6.25 mg Oral BID WC  . heparin  5,000 Units Subcutaneous Q8H  . lisinopril  5 mg Oral Daily  . pantoprazole  40 mg Oral Daily  . pneumococcal 23 valent vaccine  0.5 mL Intramuscular Tomorrow-1000  . Ticagrelor  90 mg Oral BID    Assessment/ Plan:     1. CAD:   ST elevation myocardial infarction (STEMI) of anterior wall  he has persistent ST elevation in the anterior leads - but improved since  yesterday. Cont. Brilinta 90 BID, ASA 81 d day  He has an occluded RCA that fills via collaterals.    Will resume metoprolol ( HR was a bit slow earlier today)   2. Acute systolic CHF:  His EF at cath is 35%.  Will get an echo in several days to see if he has improved.  Will need to recheck in 3 months to assess whether or not he needs an ICD.  BP is a bit soft.  He was started on Lisinopril but his creatinine is up to 1.7 today.  Will reckeck BMP tomrrow.  Will not give any diuresis today.     3.Tobacco abuse:   He needs to stop smoking.   4. Hyperlipidemia:  LDL is 110.  His goal is < 70.  Continue atorvastatin.   5. Acute renal insufficiency - stage III:  Possibly due to contrast.  He was also given a dose of Lisinopril.    Will recheck in am.   Disposition: transfer to tele  Length of Stay: 1  Thayer Headings, Brooke Bonito., MD, [facility] 12/12/2013, 9:45 AM Office [phone] Pager [phone]

## 2013-12-12 NOTE — Progress Notes (Addendum)
CARDIAC REHAB PHASE I   PRE:  Rate/Rhythm: 84 SR  BP:  Supine: 105/56  Sitting:   Standing:    SaO2:   MODE:  Ambulation: 700 ft   POST:  Rate/Rhythm: 102 ST  BP:  Supine:   Sitting: 90/45  Standing:    SaO2: 96 RA 1225-1515 Pt tolerated ambulation fair. He did c/o of slight SOB and a "gas bubble" left side of chest after walk. RA sat after walk 96% and chest discomfort went away with rest. Started MI and stent education with pt. We discussed smoking cessation. He wants to quit and has in the past up to a year. I gave him tips for quitting, schedule for quit smart classes and coaching contact number. We will continue to follow pt for ambulation and education. I stressed with pt the importants of compliance with medications and follow up with cardiologist.  Rodney Langton RN 12/12/2013 1:21 PM

## 2013-12-13 DIAGNOSIS — L259 Unspecified contact dermatitis, unspecified cause: Secondary | ICD-10-CM

## 2013-12-13 DIAGNOSIS — I10 Essential (primary) hypertension: Secondary | ICD-10-CM

## 2013-12-13 DIAGNOSIS — I5021 Acute systolic (congestive) heart failure: Secondary | ICD-10-CM

## 2013-12-13 DIAGNOSIS — I2589 Other forms of chronic ischemic heart disease: Secondary | ICD-10-CM

## 2013-12-13 DIAGNOSIS — N182 Chronic kidney disease, stage 2 (mild): Secondary | ICD-10-CM

## 2013-12-13 LAB — GLUCOSE, CAPILLARY
GLUCOSE-CAPILLARY: 88 mg/dL (ref 70–99)
Glucose-Capillary: 97 mg/dL (ref 70–99)
Glucose-Capillary: 89 mg/dL (ref 70–99)
Glucose-Capillary: 120 mg/dL — ABNORMAL HIGH (ref 70–99)

## 2013-12-13 LAB — COMPREHENSIVE METABOLIC PANEL
ALK PHOS: 51 U/L (ref 39–117)
ALT: 53 U/L (ref 0–53)
AST: 173 U/L — ABNORMAL HIGH (ref 0–37)
Albumin: 3.8 g/dL (ref 3.5–5.2)
BILIRUBIN TOTAL: 0.6 mg/dL (ref 0.3–1.2)
BUN: 21 mg/dL (ref 6–23)
CHLORIDE: 103 meq/L (ref 96–112)
CO2: 27 meq/L (ref 19–32)
Calcium: 9.5 mg/dL (ref 8.4–10.5)
Creatinine, Ser: 1.19 mg/dL (ref 0.50–1.35)
GFR calc Af Amer: 77 mL/min — ABNORMAL LOW (ref >90)
GFR, EST NON AFRICAN AMERICAN: 66 mL/min — AB (ref >90)
GLUCOSE: 94 mg/dL (ref 70–99)
POTASSIUM: 4.9 meq/L (ref 3.7–5.3)
Sodium: 141 mEq/L (ref 137–147)
Total Protein: 8.3 g/dL (ref 6.0–8.3)

## 2013-12-13 MED ORDER — HYDROCORTISONE 2.5 % RE CREA
TOPICAL_CREAM | Freq: Three times a day (TID) | RECTAL | Status: DC
  Filled 2013-12-13: qty 28.35

## 2013-12-13 NOTE — Progress Notes (Signed)
SUBJECTIVE: [name] is a 58 yo with hx of prior CAD ( BMS to mid RCA in 2000) admitted to [facility] with an anterior STEMI. Cath revealed an occluded LAD. A 4.0 x 38 Promus stent was placed in the proximal LAD and a 3.0 x 16 Promus stent was placed in the mid LAD. He has L to R and R to R collateral filling of the chronically occluded RCA. His LV EF is moderately reduced with EF of 35%.  Pt is doing well today, denying any significant chest pain. Has episodic increased work of breathing (infrequent) and his wife comments on witnessed episodes of sleep apnea prior to hospitalization as well. Denies palpitations. Creatinine has improved as well. Multiple family members present with a considerable amount of questions, all of which were answered.     Intake/Output Summary (Last 24 hours) at 12/13/13 1157 Last data filed at 12/13/13 0900  Gross per 24 hour  Intake    840 ml  Output      0 ml  Net    840 ml    Current Facility-Administered Medications  Medication Dose Route Frequency Provider Last Rate Last Dose  . 0.9 %  sodium chloride infusion   Intravenous Continuous Freddi Che, MD      . acetaminophen (TYLENOL) tablet 650 mg  650 mg Oral Q4H PRN Peter M Martinique, MD      . ALPRAZolam Duanne Moron) tablet 0.25 mg  0.25 mg Oral TID PRN Peter M Martinique, MD   0.25 mg at 12/11/13 2317  . aspirin chewable tablet 81 mg  81 mg Oral Daily Peter M Martinique, MD   81 mg at 12/13/13 1047  . atorvastatin (LIPITOR) tablet 80 mg  80 mg Oral q1800 Peter M Martinique, MD   80 mg at 12/12/13 1900  . carvedilol (COREG) tablet 6.25 mg  6.25 mg Oral BID WC Peter M Martinique, MD   6.25 mg at 12/13/13 0849  . heparin injection 5,000 Units  5,000 Units Subcutaneous Q8H Peter M Martinique, MD   5,000 Units at 12/13/13 0605  . hydrALAZINE (APRESOLINE) injection 10 mg  10 mg Intravenous Q4H PRN Peter M Martinique, MD   10 mg at 12/11/13 2123  . lisinopril (PRINIVIL,ZESTRIL) tablet 5 mg  5 mg Oral Daily Peter M Martinique, MD   5 mg  at 12/13/13 1047  . morphine 2 MG/ML injection 2 mg  2 mg Intravenous Q1H PRN Peter M Martinique, MD   2 mg at 12/12/13 0032  . nitroGLYCERIN (NITROSTAT) SL tablet 0.4 mg  0.4 mg Sublingual Q5 Min x 3 PRN Peter M Martinique, MD      . ondansetron [facility]) injection 4 mg  4 mg Intravenous Q6H PRN Peter M Martinique, MD   4 mg at 12/11/13 2121  . pantoprazole (PROTONIX) EC tablet 40 mg  40 mg Oral Daily Peter M Martinique, MD   40 mg at 12/13/13 1047  . Ticagrelor (BRILINTA) tablet 90 mg  90 mg Oral BID Peter M Martinique, MD   90 mg at 12/13/13 1047  . zolpidem (AMBIEN) tablet 5 mg  5 mg Oral QHS PRN Peter M Martinique, MD   5 mg at 12/11/13 2316    Filed Vitals:   12/12/13 1200 12/12/13 1452 12/12/13 2000 12/13/13 0445  BP: 146/128 113/77 133/81 137/98  Pulse:  95 93 72  Temp:  98.4 F (36.9 C) 98.2 F (36.8 C) 98 F (36.7 C)  TempSrc:  Oral Oral  Oral  Resp: 29 18 18 20   Height:      Weight:    251 lb 8 oz (114.08 kg)  SpO2:  98% 96% 99%    PHYSICAL EXAM General: NAD Neck: No JVD, no thyromegaly or thyroid nodule.  Lungs: Clear to auscultation bilaterally with normal respiratory effort. CV: Nondisplaced PMI.  Regular rhythm, normal S1/S2, no S3/S4, no murmur.  No pretibial edema.  No carotid bruit.  Normal pedal pulses.  Abdomen: Soft, nontender, no hepatosplenomegaly, no distention.  Neurologic: Alert and oriented x 3.  Psych: Normal affect. Extremities: No clubbing or cyanosis. Eczematous dermatitis on bilateral pretibial regions.  TELEMETRY: Reviewed telemetry pt in normal sinus rhythm.  LABS: Basic Metabolic Panel:  Recent Labs  12/11/13 0852 12/11/13 1645 12/12/13 0212 12/13/13 0428  NA 139 135* 134* 141  K 3.8 3.8 3.9 4.9  CL 105 99 99 103  CO2  --  20 22 27   GLUCOSE 142* 102* 118* 94  BUN 10 8 12 21   CREATININE 0.70 0.67 1.72* 1.19  CALCIUM  --  8.6 8.7 9.5  MG  --  1.7  --   --    Liver Function Tests:  Recent Labs  12/11/13 1645 12/13/13 0428  AST 587* 173*  ALT 97* 53    ALKPHOS 56 51  BILITOT 0.6 0.6  PROT 8.3 8.3  ALBUMIN 4.0 3.8   No results found for this basename: LIPASE, AMYLASE,  in the last 72 hours CBC:  Recent Labs  12/11/13 0852 12/11/13 1645 12/12/13 0212  WBC  --  12.8* 12.5*  NEUTROABS  --  7.3  --   HGB 17.3* 17.3* 16.3  HCT 51.0 48.6 45.8  MCV  --  86.9 87.4  PLT  --  236 247   Cardiac Enzymes:  Recent Labs  12/11/13 1645 12/11/13 2215 12/12/13 0212  TROPONINI >20.00* >20.00* >20.00*   BNP: No components found with this basename: POCBNP,  D-Dimer: No results found for this basename: DDIMER,  in the last 72 hours Hemoglobin A1C:  Recent Labs  12/11/13 1645  HGBA1C 5.8*   Fasting Lipid Panel:  Recent Labs  12/12/13 0212  CHOL 160  HDL 28*  LDLCALC 110*  TRIG 109  CHOLHDL 5.7   Thyroid Function Tests:  Recent Labs  12/11/13 1645  TSH 1.265   Anemia Panel: No results found for this basename: VITAMINB12, FOLATE, FERRITIN, TIBC, IRON, RETICCTPCT,  in the last 72 hours  RADIOLOGY: Portable Chest X-ray 1 View  12/11/2013   CLINICAL DATA:  Coronary artery disease.  EXAM: PORTABLE CHEST - 1 VIEW  COMPARISON:  None.  FINDINGS: Lordotic technique is demonstrated. Lungs are adequately inflated without consolidation or effusion. There is borderline cardiomegaly. There are degenerative changes of the right glenohumeral joint.  IMPRESSION: No active disease.   Electronically Signed   By: Marin Olp M.D.   On: 12/11/2013 16:42      ASSESSMENT AND PLAN: 1. CAD: ST elevation myocardial infarction (STEMI) of anterior wall  Symptomatically stable today. Continue Brilinta 90 BID, ASA 81 per day, Lipitor 80 mg daily, lisinopril 5 mg daily, and Coreg 6.25 mg bid. He has an occluded RCA that fills via collaterals.   2. Acute systolic CHF: Compensated today. His EF by echo is 30%. Will need to recheck in 3 months to assess whether or not he needs an ICD. He was started on lisinopril and his creatinine was up to 1.72  on 1/9, now down to 1.19 (GFR  66 ml/min). Will reckeck BMP tomrrow. Will not give any diuresis today.   3.Tobacco abuse: He needs to stop smoking.   4. Hyperlipidemia: LDL is 110. His goal is < 70. Continue high-dose atorvastatin.   5. Acute renal insufficiency - stage III: Possibly due to contrast. He was also given a dose of Lisinopril. Improving today. Continue to monitor. GFR now 66 ml/min, creatinine down to 1.19.  6. Eczematous dermatitis: will prescribe hydrocortisone ointment.  Time spent: 40 minutes.  Kate Sable, M.D., F.A.C.C.

## 2013-12-13 NOTE — Progress Notes (Signed)
CARDIAC REHAB PHASE I   Patient has ambulated independently several times today, states he does feel tired.  Education completed, patient had many questions.  Educated for MI and CHF.  Discussed at length MI, nutrition, smoking cessation, exercise, weights, NTG use, 911, restrictions, risk factors, PTCA/stents .   This patient has many questions, a positive attitude, however is is apprehensive about the changes he needs to make in his lifestyle. This was a lengthy education session.   Kerry Mason

## 2013-12-13 NOTE — Progress Notes (Signed)
The patient was educated about the importance of using the urinal for accurate Is and Os this morning.  He did not complain of any pain overnight and rested comfortably.

## 2013-12-13 NOTE — Progress Notes (Signed)
Chaplain responded to page from nurse on 3W. Pt's girlfriend had asked to see chaplain, but once chaplain had arrived, she stated that "lots of people showed up and it wasn't the best time." Pt's girlfriend inquired after other chaplain, Ramiro Harvest. Chaplain stated that she would be in tomorrow. Will refer to [facility]

## 2013-12-14 ENCOUNTER — Encounter (HOSPITAL_COMMUNITY): Payer: Self-pay | Admitting: Nurse Practitioner

## 2013-12-14 DIAGNOSIS — I10 Essential (primary) hypertension: Secondary | ICD-10-CM

## 2013-12-14 DIAGNOSIS — I255 Ischemic cardiomyopathy: Secondary | ICD-10-CM

## 2013-12-14 DIAGNOSIS — I2510 Atherosclerotic heart disease of native coronary artery without angina pectoris: Secondary | ICD-10-CM

## 2013-12-14 DIAGNOSIS — N179 Acute kidney failure, unspecified: Secondary | ICD-10-CM

## 2013-12-14 DIAGNOSIS — I5022 Chronic systolic (congestive) heart failure: Secondary | ICD-10-CM

## 2013-12-14 DIAGNOSIS — E785 Hyperlipidemia, unspecified: Secondary | ICD-10-CM

## 2013-12-14 LAB — GLUCOSE, CAPILLARY: GLUCOSE-CAPILLARY: 94 mg/dL (ref 70–99)

## 2013-12-14 LAB — BASIC METABOLIC PANEL
BUN: 22 mg/dL (ref 6–23)
CALCIUM: 9.1 mg/dL (ref 8.4–10.5)
CO2: 26 meq/L (ref 19–32)
Chloride: 101 mEq/L (ref 96–112)
Creatinine, Ser: 1.09 mg/dL (ref 0.50–1.35)
GFR calc Af Amer: 85 mL/min — ABNORMAL LOW (ref >90)
GFR calc non Af Amer: 74 mL/min — ABNORMAL LOW (ref >90)
GLUCOSE: 97 mg/dL (ref 70–99)
Potassium: 4.2 mEq/L (ref 3.7–5.3)
SODIUM: 139 meq/L (ref 137–147)

## 2013-12-14 MED ORDER — LISINOPRIL 5 MG PO TABS: 5.0000 mg | ORAL_TABLET | Freq: Every day | ORAL | Status: DC

## 2013-12-14 MED ORDER — PRAVASTATIN SODIUM 40 MG PO TABS: 40.0000 mg | ORAL_TABLET | Freq: Every day | ORAL | Status: DC

## 2013-12-14 MED ORDER — TICAGRELOR 90 MG PO TABS: 90.0000 mg | ORAL_TABLET | Freq: Two times a day (BID) | ORAL | Status: DC

## 2013-12-14 MED ORDER — NITROGLYCERIN 0.4 MG SL SUBL: 0.4000 mg | SUBLINGUAL_TABLET | SUBLINGUAL | Status: DC | PRN

## 2013-12-14 MED ORDER — CARVEDILOL 6.25 MG PO TABS: 6.2500 mg | ORAL_TABLET | Freq: Two times a day (BID) | ORAL | Status: DC

## 2013-12-14 NOTE — Progress Notes (Signed)
Patient Name: Kerry Mason Date of Encounter: 12/14/2013   Principal Problem:   ST elevation myocardial infarction (STEMI) of anterior wall Active Problems:   CAD (coronary artery disease)   Tobacco abuse   Acute systolic CHF (congestive heart failure), NYHA class 3   Cardiomyopathy, ischemic   HTN (hypertension)   Morbid obesity   Acute kidney injury   Hyperlipidemia   SUBJECTIVE  No chest pain or sob currently.  Ambulated a lot yesterday with mild dyspnea.  Improving overall.  Eager to go home.  CURRENT MEDS . aspirin  81 mg Oral Daily  . atorvastatin  80 mg Oral q1800  . carvedilol  6.25 mg Oral BID WC  . heparin  5,000 Units Subcutaneous Q8H  . hydrocortisone   Topical TID  . lisinopril  5 mg Oral Daily  . pantoprazole  40 mg Oral Daily  . Ticagrelor  90 mg Oral BID   OBJECTIVE  Filed Vitals:   12/13/13 1358 12/13/13 2053 12/14/13 0539 12/14/13 0812  BP: 159/98 123/66 116/82 117/62  Pulse: 88 78 74 74  Temp: 97.5 F (36.4 C) 98.8 F (37.1 C) 98.1 F (36.7 C)   TempSrc: Oral Oral Oral   Resp: 20 18 18    Height:      Weight:   251 lb 5.2 oz (114 kg)   SpO2: 99% 96% 99%     Intake/Output Summary (Last 24 hours) at 12/14/13 1116 Last data filed at 12/14/13 0900  Gross per 24 hour  Intake   1080 ml  Output      0 ml  Net   1080 ml   Filed Weights   12/12/13 0454 12/13/13 0445 12/14/13 0539  Weight: 253 lb 1.4 oz (114.8 kg) 251 lb 8 oz (114.08 kg) 251 lb 5.2 oz (114 kg)   PHYSICAL EXAM  General: Pleasant, NAD. Neuro: Alert and oriented X 3. Moves all extremities spontaneously. Psych: Normal affect. HEENT:  Normal  Neck: Supple without bruits or JVD. Lungs:  Resp regular and unlabored, CTA. Heart: RRR no s3, s4, or murmurs. Abdomen: Soft, non-tender, non-distended, BS + x 4.  Extremities: No clubbing, cyanosis or edema. DP/PT/Radials 2+ and equal bilaterally.  r wrist w/o bleeding/bruit/hematoma.  Accessory Clinical Findings  CBC  Recent  Labs  12/11/13 1645 12/12/13 0212  WBC 12.8* 12.5*  NEUTROABS 7.3  --   HGB 17.3* 16.3  HCT 48.6 45.8  MCV 86.9 87.4  PLT 236 161   Basic Metabolic Panel  Recent Labs  12/11/13 1645  12/13/13 0428 12/14/13 0230  NA 135*  < > 141 139  K 3.8  < > 4.9 4.2  CL 99  < > 103 101  CO2 20  < > 27 26  GLUCOSE 102*  < > 94 97  BUN 8  < > 21 22  CREATININE 0.67  < > 1.19 1.09  CALCIUM 8.6  < > 9.5 9.1  MG 1.7  --   --   --   < > = values in this interval not displayed. Liver Function Tests  Recent Labs  12/11/13 1645 12/13/13 0428  AST 587* 173*  ALT 97* 53  ALKPHOS 56 51  BILITOT 0.6 0.6  PROT 8.3 8.3  ALBUMIN 4.0 3.8   Cardiac Enzymes  Recent Labs  12/11/13 1645 12/11/13 2215 12/12/13 0212  TROPONINI >20.00* >20.00* >20.00*   Hemoglobin A1C  Recent Labs  12/11/13 1645  HGBA1C 5.8*   Fasting Lipid Panel  Recent Labs  12/12/13 0212  CHOL 160  HDL 28*  LDLCALC 110*  TRIG 109  CHOLHDL 5.7   Thyroid Function Tests  Recent Labs  12/11/13 1645  TSH 1.265   TELE  rsr  Radiology/Studies  Portable Chest X-ray 1 View  12/11/2013   CLINICAL DATA:  Coronary artery disease.  EXAM: PORTABLE CHEST - 1 VIEW  COMPARISON:  None.  FINDINGS: Lordotic technique is demonstrated. Lungs are adequately inflated without consolidation or effusion. There is borderline cardiomegaly. There are degenerative changes of the right glenohumeral joint.  IMPRESSION: No active disease.   Electronically Signed   By: Marin Olp M.D.   On: 12/11/2013 16:42   ASSESSMENT AND PLAN  1. Acute Ant STEMI:  S/p PCI/DES to the LAD.  Also with CTO of the RCA with L->R and R->R collats.  No further chest pain.  Ambulating without difficulty.  Cont asa, brilinta, bb, acei, statin.  He has no insurance of med coverage and will require $4 meds wherever possible.  We will switch statin to prava 40 on d/c.  He has a 30 day card for brilinta and I will fill out paperwork for brilinta assistance  and have discussed his case with pharmacy resident here, re: med to bed program.  2.  ICM/Acute systolic CHF:  Volume looks good.  Minus 1 Liter for admission and weight stable at 251 lbs.  Tolerating bb/acei.  BP/HR stable.  Will need f/u echo in 3 mos to reassess LV fxn and determine candidacy for ICD placement.  Will consider adding spiro as outpt.    3.  HL:  LDL 110.  F/U LFT's improved c/w admission.  Will need to switch statin to prava 40 2/2 cost.  F/U lipids/lft's in 6 wks.  4.  Tob Abuse:  Cessation advised.  Says that he will quit.  5.  HTN:  Stable.  Cont bb/acei.  6.  AKI:  Renal fxn normalized.  Likely 2/2 diuresis/contrast.  7.  Morbid Obesity:  Cardiac rehab.  Signed, Murray Hodgkins NP

## 2013-12-14 NOTE — Discharge Instructions (Signed)
**  PLEASE REMEMBER TO BRING ALL OF YOUR MEDICATIONS TO EACH OF YOUR FOLLOW-UP OFFICE VISITS. ° °NO HEAVY LIFTING X 4 WEEKS. °NO SEXUAL ACTIVITY X 4 WEEKS. °NO DRIVING X 2 WEEKS. °NO SOAKING BATHS, HOT TUBS, POOLS, ETC., X 7 DAYS. ° °Radial Site Care °Refer to this sheet in the next few weeks. These instructions provide you with information on caring for yourself after your procedure. Your caregiver may also give you more specific instructions. Your treatment has been planned according to current medical practices, but problems sometimes occur. Call your caregiver if you have any problems or questions after your procedure. °HOME CARE INSTRUCTIONS °· You may shower the day after the procedure. Remove the bandage (dressing) and gently wash the site with plain soap and water. Gently pat the site dry.  °· Do not apply powder or lotion to the site.  °· Do not submerge the affected site in water for 3 to 5 days.  °· Inspect the site at least twice daily.  °· Do not flex or bend the affected arm for 24 hours.  °· No lifting over 5 pounds (2.3 kg) for 5 days after your procedure.  ° °What to expect: °· Any bruising will usually fade within 1 to 2 weeks.  °· Blood that collects in the tissue (hematoma) may be painful to the touch. It should usually decrease in size and tenderness within 1 to 2 weeks.  °SEEK IMMEDIATE MEDICAL CARE IF: °· You have unusual pain at the radial site.  °· You have redness, warmth, swelling, or pain at the radial site.  °· You have drainage (other than a small amount of blood on the dressing).  °· You have chills.  °· You have a fever or persistent symptoms for more than 72 hours.  °· You have a fever and your symptoms suddenly get worse.  °· Your arm becomes pale, cool, tingly, or numb.  °· You have heavy bleeding from the site. Hold pressure on the site.  ° °

## 2013-12-14 NOTE — Progress Notes (Signed)
The patient was seen and examined, and I agree with the assessment and plan as documented above. Pt doing well denying symptoms of chest pain with exertion and at rest. Tobacco cessation and medication compliance was emphasized. He is being provided with Brilinta samples as well. Will need to f/u in clinic in near future and commence cardiac rehabilitation as well. Will discharge today.

## 2013-12-14 NOTE — Discharge Summary (Signed)
Discharge Summary   Patient ID: Kerry Mason,  MRN: [id number], DOB/AGE: 1956-06-19 58 y.o.  Admit date: 12/11/2013 Discharge date: 12/14/2013  Primary Care Provider: None Primary Cardiologist: P. Martinique, MD   Discharge Diagnoses Principal Problem:   ST elevation myocardial infarction (STEMI) of anterior wall  **S/P PCI and DES to the LAD this admission. Active Problems:   CAD (coronary artery disease)  **Residual chronic total occlusion of the RCA.   Tobacco abuse   Acute systolic CHF (congestive heart failure), NYHA class 3  **Discharge weight of 251 lbs.   Cardiomyopathy, ischemic  **EF 30% by echo this admission.   HTN (hypertension)   Morbid obesity   Acute kidney injury  **In setting of diuresis and contrast - resolved.   Hyperlipidemia  Allergies No Known Allergies  Procedures  Cardiac Catheterization and Percutaneous Coronary Intervention 1.8.2015  PROCEDURAL FINDINGS Hemodynamics: AO 144/95 mean 116 mm Hg LV 151/25 mm Hg              Coronary angiography: Coronary dominance: right  Left mainstem: The left main is large with bulky eccentric 50% stenosis distally.   Left anterior descending (LAD): 100% occluded proximally. Moderate calcification.   **The mid LAD was successfully stented using a 3.0 x 16 mm Promus DES.  The proximal LAD was successfully stented using a 4.0 x 38 mm Promus DES.  Ramus intermediate: large bifurcating vessel. Normal.   Left circumflex (LCx): Small, normal.  Right coronary artery (RCA): 100% occluded proximally with left to right and right to right collaterals. Left ventriculography: Left ventricular systolic function is abnormal, There is severe hypokinesis of the anterior wall, apex, and distal inferior wall. LVEF is estimated at 35%, there is no significant mitral regurgitation  _____________   2D Echocardiogram 1.9.2015  Study Conclusions  - Left ventricle: The cavity size was normal. Wall thickness   was increased in  a pattern of moderate LVH. The estimated   ejection fraction was 30%. The inferoseptal, anteroseptal,   and anterior walls were severely hypokinetic. Akinesis of   the apical inferior wall segment and the true apex. The   basal to mid inferior wall segments were hypokinetic.   Doppler parameters are consistent with abnormal left   ventricular relaxation (grade 1 diastolic dysfunction). - Aortic valve: There was no stenosis. - Mitral valve: Mildly calcified annulus. Normal thickness   leaflets . Trivial regurgitation. - Left atrium: The atrium was mildly dilated. - Right ventricle: The cavity size was normal. Systolic   function was normal. - Tricuspid valve: Peak RV-RA gradient: 36mm Hg (S). - Pulmonary arteries: PA peak pressure: 21mm Hg (S). - Inferior vena cava: The vessel was normal in size; the   respirophasic diameter changes were in the normal range (=   50%); findings are consistent with normal central venous   pressure. - Pericardium, extracardiac: A trivial pericardial effusion   was identified. _____________   History of Present Illness  58 y/o male with a prior history of CAD s/p bare metal stenting of the RCA in 2000, who has not followed up with cardiology since.  He was in his usual state of health until the AM of admission, when he awoke at 3 AM with severe chest pain radiating to his back and shoulders.  His girlfriend drove him into the [facility] ED where his ECG showed anterior ST elevation.  A Code STEMI was activated and he was taken to the cath lab for emergent catheterization.  Hospital Course  Cardiac catheterization revealed  a fresh occlusion of the proximal LAD along with a chronic total occlusion of the RCA with left to right and right to right collaterals.  The LAD was felt to be the infarct vessel and this was successfully stented using 2 Promus drug eluting stents.  Post-procedure he was monitored in the coronary intensive care unit where he eventually peaked his  troponin at greater than 20.  He continued to have chest pain post-procedure with evidence of residual ST elevation and anterior Q waves.  With the addition of nitroglycerin, pain relief and blood pressure control was achieved.  His creatinine rose to 1.72 on January 9th. This was felt to be secondary to contrast exposure along with having been treated with lasix and lisinopril.  Meds were held and creatinine normalized.  Echocardiogram showed an EF of 30% with multiple wall motion abnormalities.  In this setting, beta blocker and ace inhibitor was resumed and creatinine has remained stable.  he was transferred out to the floor on 1/9, and has been ambulating with mild dyspnea but no chest pain.  His volume status and weight have been stabled.  He has undergone counseling regarding smoking cessation, medication and dietary compliance, daily weights, and sodium avoidance.  We have worked with pharmacy staff to secure him 30 days of free medications through the [facility] outpatient pharmacy.  We have also filled out necessary paperwork for Brilinta assistance through [employer].  He will be discharged home today in good condition.    Discharge Vitals Blood pressure 117/62, pulse 74, temperature 98.1 F (36.7 C), temperature source Oral, resp. rate 18, height 6\' 2"  (1.88 m), weight 251 lb 5.2 oz (114 kg), SpO2 99.00%.  Filed Weights   12/12/13 0454 12/13/13 0445 12/14/13 0539  Weight: 253 lb 1.4 oz (114.8 kg) 251 lb 8 oz (114.08 kg) 251 lb 5.2 oz (114 kg)   Labs  CBC  Recent Labs  12/11/13 1645 12/12/13 0212  WBC 12.8* 12.5*  NEUTROABS 7.3  --   HGB 17.3* 16.3  HCT 48.6 45.8  MCV 86.9 87.4  PLT 236 222   Basic Metabolic Panel  Recent Labs  12/11/13 1645  12/13/13 0428 12/14/13 0230  NA 135*  < > 141 139  K 3.8  < > 4.9 4.2  CL 99  < > 103 101  CO2 20  < > 27 26  GLUCOSE 102*  < > 94 97  BUN 8  < > 21 22  CREATININE 0.67  < > 1.19 1.09  CALCIUM 8.6  < > 9.5 9.1  MG 1.7  --   --   --    < > = values in this interval not displayed.  Liver Function Tests  Recent Labs  12/11/13 1645 12/13/13 0428  AST 587* 173*  ALT 97* 53  ALKPHOS 56 51  BILITOT 0.6 0.6  PROT 8.3 8.3  ALBUMIN 4.0 3.8   Cardiac Enzymes  Recent Labs  12/11/13 1645 12/11/13 2215 12/12/13 0212  TROPONINI >20.00* >20.00* >20.00*   Hemoglobin A1C  Recent Labs  12/11/13 1645  HGBA1C 5.8*   Fasting Lipid Panel  Recent Labs  12/12/13 0212  CHOL 160  HDL 28*  LDLCALC 110*  TRIG 109  CHOLHDL 5.7   Thyroid Function Tests  Recent Labs  12/11/13 1645  TSH 1.265   Disposition  Pt is being discharged home today in good condition.  Follow-up Plans & Appointments  Follow-up Information   Follow up with Peter Martinique, MD In 1  week. (we will arrange and contact you.)    Specialty:  Cardiology   Contact information:   [address] [place] [address] [phone]       Follow up with Please obtain a primary care provider.Marland Kitchen     Discharge Medications    Medication List    STOP taking these medications       ibuprofen 200 MG tablet  Commonly known as:  ADVIL,MOTRIN      TAKE these medications       aspirin EC 81 MG tablet  Take 81 mg by mouth daily.     carvedilol 6.25 MG tablet  Commonly known as:  COREG  Take 1 tablet (6.25 mg total) by mouth 2 (two) times daily with a meal.     lisinopril 5 MG tablet  Commonly known as:  PRINIVIL,ZESTRIL  Take 1 tablet (5 mg total) by mouth daily.     nitroGLYCERIN 0.4 MG SL tablet  Commonly known as:  NITROSTAT  Place 1 tablet (0.4 mg total) under the tongue every 5 (five) minutes as needed for chest pain.     pravastatin 40 MG tablet  Commonly known as:  PRAVACHOL  Take 1 tablet (40 mg total) by mouth daily.     Ticagrelor 90 MG Tabs tablet  Commonly known as:  BRILINTA  Take 1 tablet (90 mg total) by mouth 2 (two) times daily.       Outstanding Labs/Studies  *Follow-up lipids and lft's in 6  wks. *Follow-up echocardiogram in 3 mos.  Duration of Discharge Encounter   Greater than 30 minutes including physician time.  Signed, Murray Hodgkins NP 12/14/2013, 11:49 AM

## 2013-12-15 ENCOUNTER — Telehealth: Payer: Self-pay | Admitting: Cardiology

## 2013-12-15 NOTE — Telephone Encounter (Signed)
New Message  Per after Hours Pt is TCM Made appt for 12/18/2013 @ 9 am

## 2013-12-15 NOTE — Telephone Encounter (Signed)
Patient contacted regarding discharge from [facility] on 12/14/13  Patient understands to follow up with provider Dr Peter Martinique on 12/18/13 at [place] at [facility] [address] Patient understands discharge instructions? yes Patient understands medications and regiment? yes Patient understands to bring all medications to this visit? yes  The pt reports having a little SOB with exertion. He denies SOB with lying flat. He will discuss this with dr Martinique on Thursday this week. He was given the number to call if he has problems prior to appt. Patient voiced understanding

## 2013-12-17 ENCOUNTER — Telehealth: Payer: Self-pay

## 2013-12-17 NOTE — Telephone Encounter (Signed)
Received call from patient he stated he has been sob since he was discharged on Sunday 12/14/13.Stated sob not really bad.Stated he is not having any chest pain or chest heaviness.Stated he feels anxious.Also stated he has been having pain around both eyes.Patient offered appointment to see Truitt Merle NP today, but stated he will just keep appointment with Dr.Jordan tomorrow 12/18/13.Advised to call back or go to ER if needed.

## 2013-12-18 ENCOUNTER — Other Ambulatory Visit: Payer: Self-pay

## 2013-12-18 ENCOUNTER — Other Ambulatory Visit: Payer: Self-pay | Admitting: *Deleted

## 2013-12-18 ENCOUNTER — Encounter: Payer: Self-pay | Admitting: Cardiology

## 2013-12-18 ENCOUNTER — Ambulatory Visit (INDEPENDENT_AMBULATORY_CARE_PROVIDER_SITE_OTHER): Payer: Medicaid Other | Admitting: Cardiology

## 2013-12-18 VITALS — BP 125/87 | HR 86 | Ht 74.0 in | Wt 247.4 lb

## 2013-12-18 DIAGNOSIS — I2109 ST elevation (STEMI) myocardial infarction involving other coronary artery of anterior wall: Secondary | ICD-10-CM

## 2013-12-18 DIAGNOSIS — I2589 Other forms of chronic ischemic heart disease: Secondary | ICD-10-CM | POA: Diagnosis not present

## 2013-12-18 DIAGNOSIS — Z720 Tobacco use: Secondary | ICD-10-CM

## 2013-12-18 DIAGNOSIS — I10 Essential (primary) hypertension: Secondary | ICD-10-CM

## 2013-12-18 DIAGNOSIS — I213 ST elevation (STEMI) myocardial infarction of unspecified site: Secondary | ICD-10-CM

## 2013-12-18 DIAGNOSIS — F17200 Nicotine dependence, unspecified, uncomplicated: Secondary | ICD-10-CM

## 2013-12-18 DIAGNOSIS — I2510 Atherosclerotic heart disease of native coronary artery without angina pectoris: Secondary | ICD-10-CM

## 2013-12-18 DIAGNOSIS — E785 Hyperlipidemia, unspecified: Secondary | ICD-10-CM

## 2013-12-18 DIAGNOSIS — I5021 Acute systolic (congestive) heart failure: Secondary | ICD-10-CM

## 2013-12-18 DIAGNOSIS — I255 Ischemic cardiomyopathy: Secondary | ICD-10-CM

## 2013-12-18 LAB — BASIC METABOLIC PANEL
BUN: 17 mg/dL (ref 6–23)
CHLORIDE: 102 meq/L (ref 96–112)
CO2: 27 mEq/L (ref 19–32)
Calcium: 9.1 mg/dL (ref 8.4–10.5)
Creatinine, Ser: 1.1 mg/dL (ref 0.4–1.5)
GFR: 72.38 mL/min (ref >60.00)
Glucose, Bld: 85 mg/dL (ref 70–99)
Potassium: 4.5 mEq/L (ref 3.5–5.1)
Sodium: 135 mEq/L (ref 135–145)

## 2013-12-18 LAB — BRAIN NATRIURETIC PEPTIDE: PRO B NATRI PEPTIDE: 96 pg/mL (ref 0.0–100.0)

## 2013-12-18 MED ORDER — SPIRONOLACTONE 25 MG PO TABS: 25.0000 mg | ORAL_TABLET | Freq: Every day | ORAL | Status: DC

## 2013-12-18 MED ORDER — LISINOPRIL 10 MG PO TABS: 10.0000 mg | ORAL_TABLET | Freq: Every day | ORAL | Status: DC

## 2013-12-18 NOTE — Patient Instructions (Signed)
Continue low sodium diet.   We will increase lisinopril to 10 mg daily.  We will add aldactone 25 mg daily  We will check blood work today.   I encourage you do join Cardiac Rehab.  I will see you in 2-3 weeks.  Congratulations on stopping smoking.

## 2013-12-18 NOTE — Progress Notes (Signed)
Kerry Mason Date of Birth: 1956/02/25 Medical Record #[id number]  History of Present Illness: [name] is seen for follow up after recent hospitalization for an acute anterior STEMI. He has a history of remote stenting of the RCA in 2000. He had no follow up. He presented 6 hours into a large anterior STEMI. Cath showed Occlusion of the proximal RCA with collaterals and occlusion of the proximal LAD. The RCA occlusion was felt to be chronic. EF was 35% by cath and 30% by Echo. The proximal and mid to distal LAD were stented with DES.He had acute renal insufficiency that resolved. Meds were adjusted and he was discharged day 3. On follow up he describes some episodes of dyspnea. These are random and not associated with being supine or exertion. He feels he needs to take several deep breaths and then it resolves. No chest pain or edema. No palpitations. Weight is down 4 lbs. He reports that he is no longer smoking.  Current Outpatient Prescriptions on File Prior to Visit  Medication Sig Dispense Refill  . aspirin EC 81 MG tablet Take 81 mg by mouth daily.      . carvedilol (COREG) 6.25 MG tablet Take 1 tablet (6.25 mg total) by mouth 2 (two) times daily with a meal.  60 tablet  0  . nitroGLYCERIN (NITROSTAT) 0.4 MG SL tablet Place 1 tablet (0.4 mg total) under the tongue every 5 (five) minutes as needed for chest pain.  25 tablet  0  . pravastatin (PRAVACHOL) 40 MG tablet Take 1 tablet (40 mg total) by mouth daily.  30 tablet  0  . Ticagrelor (BRILINTA) 90 MG TABS tablet Take 1 tablet (90 mg total) by mouth 2 (two) times daily.  60 tablet  0   No current facility-administered medications on file prior to visit.    No Known Allergies  Past Medical History  Diagnosis Date  . CAD (coronary artery disease)     a. 2000: s/p stent of RCA 2000 with BMS  . Tobacco abuse   . Obesity   . ST elevation myocardial infarction (STEMI) of anterior wall, subsequent episode of care   . HTN  (hypertension)   . Hyperlipidemia   . Acute systolic CHF (congestive heart failure)   . Ischemic cardiomyopathy     Past Surgical History  Procedure Laterality Date  . Cardiac catheterization    . Coronary angioplasty      History  Smoking status  . Former Smoker -- 1.00 packs/day for 35 years  . Types: Cigarettes  . Quit date: 12/11/2013  Smokeless tobacco  . Not on file    History  Alcohol Use No    Family History  Problem Relation Age of Onset  . CAD Father     PTCA    Review of Systems: As noted in HPI>  All other systems were reviewed and are negative.  Physical Exam: BP 125/87  Pulse 86  Ht 6\' 2"  (1.88 m)  Wt 247 lb 6.4 oz (112.22 kg)  BMI 31.75 kg/m2 He is a pleasant overweight WM in NAD.  HEENT: normal Neck: No JVD, adenopathy, thyromegaly or bruits. Lungs: clear. CV: RRR, normal S1-2, No murmur or gallop Abdomen: obese, soft, nontender. BS positive. No HSM Extremities: No edema, pulses 2+. Radial site without hematoma Neuro: alert, oriented x 3. CN II-XII intact. No focal findings.  LABORATORY DATA: Ecg: NSR rate 86 bpm, Persistent ST elevation in leads V1-5 with Q waves V1-4.  Assessment / Plan:  1. Anterior STEMI s/p stenting of the proximal and mid-distal LAD with DES. No recurrent angina. Ecg shows persistent ST elevation with Q waves. Presentation was relatively late. Continue dual antiplatelet therapy with ASA and Brilinta. I suspect his symptoms of dyspnea are related to Brilinta but his symptoms are fairly minor so we will continue. If this becomes more of a problem we could switch to Effient. Patient is at high risk for arrhythmia. I have recommended a Lifevest. Will need to see if this can be leased from hospital since he doesn't have insurance. Encourage cardiac Rehab.   2. CHF acute systolic. Ischemic cardiomyopathy with EF 30-35%. No evidence of volume overload today. Will check BMET and BNP. Continue carvedilol. Increase lisinopril to 10 mg  daily. Add aldactone 25 mg daily. Follow up in 2 weeks and continue to adjust meds as tolerated. Repeat Echo in 3 months. If EF remains less than 35% will need to consider ICD.  3. HTN controlled  4. Hyperlipidemia. On pravastatin. Recheck lab in 6 weeks.  5. Tobacco abuse. Continue cessation.  6. CAD chronic occlusion of RCA with good collaterals. Manage medically.

## 2013-12-22 ENCOUNTER — Other Ambulatory Visit (INDEPENDENT_AMBULATORY_CARE_PROVIDER_SITE_OTHER): Payer: Medicaid Other

## 2013-12-22 DIAGNOSIS — I2109 ST elevation (STEMI) myocardial infarction involving other coronary artery of anterior wall: Secondary | ICD-10-CM | POA: Diagnosis not present

## 2013-12-22 DIAGNOSIS — I219 Acute myocardial infarction, unspecified: Secondary | ICD-10-CM

## 2013-12-22 DIAGNOSIS — I509 Heart failure, unspecified: Secondary | ICD-10-CM

## 2013-12-22 DIAGNOSIS — I213 ST elevation (STEMI) myocardial infarction of unspecified site: Secondary | ICD-10-CM

## 2013-12-22 DIAGNOSIS — I2510 Atherosclerotic heart disease of native coronary artery without angina pectoris: Secondary | ICD-10-CM | POA: Diagnosis not present

## 2013-12-22 DIAGNOSIS — I5021 Acute systolic (congestive) heart failure: Secondary | ICD-10-CM

## 2013-12-22 DIAGNOSIS — I10 Essential (primary) hypertension: Secondary | ICD-10-CM

## 2013-12-22 LAB — BASIC METABOLIC PANEL
BUN: 19 mg/dL (ref 6–23)
CHLORIDE: 100 meq/L (ref 96–112)
CO2: 25 mEq/L (ref 19–32)
Calcium: 8.9 mg/dL (ref 8.4–10.5)
Creatinine, Ser: 1.2 mg/dL (ref 0.4–1.5)
GFR: 66.15 mL/min (ref >60.00)
Glucose, Bld: 91 mg/dL (ref 70–99)
Potassium: 4.8 mEq/L (ref 3.5–5.1)
SODIUM: 133 meq/L — AB (ref 135–145)

## 2013-12-23 ENCOUNTER — Telehealth: Payer: Self-pay

## 2013-12-23 ENCOUNTER — Other Ambulatory Visit: Payer: Self-pay

## 2013-12-23 NOTE — Telephone Encounter (Signed)
Spoke to patient recent lab results given.Patient stated he still has sob.Stated he notices sob when he is at rest.Stated he felt sob before his heart attack.Stated he was more active for the past 2 days and today he will not be as active.Patient was offered appointment with Dr.Jordan 12/25/13.Patient stated he will wait until his next appointment with Dr.Jordan 01/01/14.Advised to call back if needed.

## 2013-12-26 ENCOUNTER — Telehealth: Payer: Self-pay

## 2013-12-26 NOTE — Telephone Encounter (Signed)
Spoke to patient told Dr.Jordan still sob.He advised to stop Brilanta and start Plavix.Patient stated his sob has improved and he is feeling better.Stated he wants to wait.Stated he will discuss at 01/01/14 office visit with Dr.Jordan.Also spoke to [place] at Cardiac Rehab she advised patient will need to complete financial assistance paper work and turn in before they can enroll him in cardiac rehab.Patient stated he will complete and turn in.

## 2014-01-01 ENCOUNTER — Ambulatory Visit (INDEPENDENT_AMBULATORY_CARE_PROVIDER_SITE_OTHER): Payer: Medicaid Other | Admitting: Cardiology

## 2014-01-01 ENCOUNTER — Encounter: Payer: Self-pay | Admitting: Cardiology

## 2014-01-01 ENCOUNTER — Telehealth: Payer: Self-pay | Admitting: *Deleted

## 2014-01-01 VITALS — BP 124/72 | HR 62 | Ht 74.0 in | Wt 252.1 lb

## 2014-01-01 DIAGNOSIS — I2589 Other forms of chronic ischemic heart disease: Secondary | ICD-10-CM | POA: Diagnosis not present

## 2014-01-01 DIAGNOSIS — I10 Essential (primary) hypertension: Secondary | ICD-10-CM

## 2014-01-01 DIAGNOSIS — I2109 ST elevation (STEMI) myocardial infarction involving other coronary artery of anterior wall: Secondary | ICD-10-CM | POA: Diagnosis not present

## 2014-01-01 DIAGNOSIS — I2510 Atherosclerotic heart disease of native coronary artery without angina pectoris: Secondary | ICD-10-CM | POA: Diagnosis not present

## 2014-01-01 DIAGNOSIS — I255 Ischemic cardiomyopathy: Secondary | ICD-10-CM

## 2014-01-01 NOTE — Telephone Encounter (Signed)
Mailed patient assistance application for brilinta through [employer]

## 2014-01-01 NOTE — Progress Notes (Signed)
Kerry Mason Date of Birth: Apr 14, 1956 Medical Record #[id number]  History of Present Illness: Mr. Surgeon is seen for follow up today. He has a history of remote stenting of the RCA in 2000. He had no follow up. He presented 6 hours into a large anterior STEMI on 12/11/13. Cath showed Occlusion of the proximal RCA with collaterals and occlusion of the proximal LAD. The RCA occlusion was felt to be chronic. EF was 35% by cath and 30% by Echo. The proximal and mid to distal LAD were stented with DES.He had acute renal insufficiency that resolved. Meds were adjusted and he was discharged day 3.  He reports that he is no longer smoking. He is wearing a life vest. He was having symptoms of dyspnea felt to be related to Brilinta. He reports this is doing much better now. He did have an activation of his lifevest this morning that he aborted. No chest pain, edema, palpitations.  Current Outpatient Prescriptions on File Prior to Visit  Medication Sig Dispense Refill  . aspirin EC 81 MG tablet Take 81 mg by mouth daily.      . carvedilol (COREG) 6.25 MG tablet Take 1 tablet (6.25 mg total) by mouth 2 (two) times daily with a meal.  60 tablet  0  . lisinopril (PRINIVIL,ZESTRIL) 10 MG tablet Take 1 tablet (10 mg total) by mouth daily.  90 tablet  3  . nitroGLYCERIN (NITROSTAT) 0.4 MG SL tablet Place 1 tablet (0.4 mg total) under the tongue every 5 (five) minutes as needed for chest pain.  25 tablet  0  . pravastatin (PRAVACHOL) 40 MG tablet Take 1 tablet (40 mg total) by mouth daily.  30 tablet  0  . spironolactone (ALDACTONE) 25 MG tablet Take 1 tablet (25 mg total) by mouth daily.  90 tablet  3  . Ticagrelor (BRILINTA) 90 MG TABS tablet Take 1 tablet (90 mg total) by mouth 2 (two) times daily.  60 tablet  0   No current facility-administered medications on file prior to visit.    No Known Allergies  Past Medical History  Diagnosis Date  . CAD (coronary artery disease)     a. 2000: s/p stent of  RCA 2000 with BMS  . Tobacco abuse   . Obesity   . ST elevation myocardial infarction (STEMI) of anterior wall, subsequent episode of care   . HTN (hypertension)   . Hyperlipidemia   . Acute systolic CHF (congestive heart failure)   . Ischemic cardiomyopathy     Past Surgical History  Procedure Laterality Date  . Cardiac catheterization    . Coronary angioplasty      History  Smoking status  . Former Smoker -- 1.00 packs/day for 35 years  . Types: Cigarettes  . Quit date: 12/11/2013  Smokeless tobacco  . Not on file    History  Alcohol Use No    Family History  Problem Relation Age of Onset  . CAD Father     PTCA    Review of Systems: As noted in HPI.  All other systems were reviewed and are negative.  Physical Exam: BP 124/72  Pulse 62  Ht 6\' 2"  (1.88 m)  Wt 252 lb 1.9 oz (114.361 kg)  BMI 32.36 kg/m2 He is a pleasant overweight WM in NAD. Wearing Lifevest. HEENT: normal Neck: No JVD, adenopathy, thyromegaly or bruits. Lungs: clear. CV: RRR, normal S1-2, No murmur or gallop Abdomen: obese, soft, nontender. BS positive. No HSM Extremities: No edema, pulses  2+. Radial site without hematoma Neuro: alert, oriented x 3. CN II-XII intact. No focal findings.  LABORATORY DATA: Lab Results  Component Value Date   WBC 12.5* 12/12/2013   HGB 16.3 12/12/2013   HCT 45.8 12/12/2013   PLT 247 12/12/2013   GLUCOSE 91 12/22/2013   CHOL 160 12/12/2013   TRIG 109 12/12/2013   HDL 28* 12/12/2013   LDLCALC 110* 12/12/2013   ALT 53 12/13/2013   AST 173* 12/13/2013   NA 133* 12/22/2013   K 4.8 12/22/2013   CL 100 12/22/2013   CREATININE 1.2 12/22/2013   BUN 19 12/22/2013   CO2 25 12/22/2013   TSH 1.265 12/11/2013   INR 0.92 12/11/2013   HGBA1C 5.8* 12/11/2013     Assessment / Plan: 1. Anterior STEMI s/p stenting of the proximal and mid-distal LAD with DES. No recurrent angina. Ecg shows persistent ST elevation with Q waves. Presentation was relatively late. Continue dual antiplatelet  therapy with ASA and Brilinta. Dyspnea improved. Patient is at high risk for arrhythmia. Continue Lifevest.  Encourage cardiac Rehab.   2. CHF chronic systolic. Ischemic cardiomyopathy with EF 30-35%. No evidence of volume overload today.  Continue aldactone and lisinopril at current dose. Increase carvedilol to 12.5 mg bid.  Follow up in 3 weeks with Cecille Rubin and continue to adjust meds as tolerated. Repeat Echo at 3 months. If EF remains less than 35% will need to consider ICD.  3. HTN controlled  4. Hyperlipidemia. On pravastatin. Recheck fasting lab in 3 weeks.  5. Tobacco abuse. Continue cessation.  6. CAD chronic occlusion of RCA with good collaterals. Manage medically.

## 2014-01-01 NOTE — Patient Instructions (Signed)
Increase carvedilol to 12.5 mg twice a day.  Continue your other therapy  We will have you follow up in 3 weeks with lab work.

## 2014-01-02 ENCOUNTER — Telehealth: Payer: Self-pay | Admitting: Cardiology

## 2014-01-02 NOTE — Telephone Encounter (Signed)
New message  Dr. Martinique changed some medications yesterday at his appointment. His BP bottomed out last night 77/51, he would like to speak with you regarding if he should take the meds this morning? Please call and advise.

## 2014-01-02 NOTE — Telephone Encounter (Signed)
Returned call to patient he stated Coreg increased to 12.5 mg twice a day yesterday.Stated he took Coreg 12.5 mg last evening and around 11:00 pm he felt dizzy B/P 77/51 pulse 62.Stated this morning B/P 120/72 pulse 65 and he feels better no dizziness.Advised to take Coreg 6.25 mg twice a day.Advised to continue to monitor B/P, Dr.Jordan out of office today will send message to him for review.

## 2014-01-04 NOTE — Telephone Encounter (Signed)
Would try and increase to 9.375 mg bid. This would be one and a half of his 6.25 mg tablets.  Sahily Biddle Martinique MD, [facility]

## 2014-01-05 NOTE — Telephone Encounter (Signed)
Spoke to patient Dr.Jordan advised to take Coreg 6.25 mg 11/2 tablets twice a day.Patient stated B/P over the weekend ranging 111/76.Stated he received a smaller vest for Life Vest.Advised to continue to monitor B/P and call back if too low.

## 2014-01-06 ENCOUNTER — Other Ambulatory Visit: Payer: Self-pay

## 2014-01-12 ENCOUNTER — Telehealth: Payer: Self-pay | Admitting: Cardiology

## 2014-01-12 NOTE — Telephone Encounter (Signed)
New message     Need a return to work note from doctor.  And talk to a nurse regarding his medications

## 2014-01-12 NOTE — Telephone Encounter (Signed)
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

## 2014-01-26 ENCOUNTER — Other Ambulatory Visit: Payer: Self-pay | Admitting: *Deleted

## 2014-01-26 ENCOUNTER — Telehealth: Payer: Self-pay | Admitting: *Deleted

## 2014-01-26 ENCOUNTER — Encounter: Payer: Self-pay | Admitting: Nurse Practitioner

## 2014-01-26 ENCOUNTER — Other Ambulatory Visit: Payer: Self-pay

## 2014-01-26 ENCOUNTER — Ambulatory Visit (INDEPENDENT_AMBULATORY_CARE_PROVIDER_SITE_OTHER): Payer: Medicaid Other | Admitting: Nurse Practitioner

## 2014-01-26 VITALS — BP 130/80 | HR 68 | Ht 74.0 in | Wt 239.0 lb

## 2014-01-26 DIAGNOSIS — E785 Hyperlipidemia, unspecified: Secondary | ICD-10-CM | POA: Diagnosis not present

## 2014-01-26 DIAGNOSIS — R079 Chest pain, unspecified: Secondary | ICD-10-CM

## 2014-01-26 DIAGNOSIS — I2109 ST elevation (STEMI) myocardial infarction involving other coronary artery of anterior wall: Secondary | ICD-10-CM | POA: Diagnosis not present

## 2014-01-26 DIAGNOSIS — M109 Gout, unspecified: Secondary | ICD-10-CM

## 2014-01-26 DIAGNOSIS — M25569 Pain in unspecified knee: Secondary | ICD-10-CM

## 2014-01-26 LAB — BASIC METABOLIC PANEL
BUN: 16 mg/dL (ref 6–23)
CO2: 23 mEq/L (ref 19–32)
Calcium: 9.1 mg/dL (ref 8.4–10.5)
Chloride: 108 mEq/L (ref 96–112)
Creatinine, Ser: 0.9 mg/dL (ref 0.4–1.5)
GFR: 89.86 mL/min (ref >60.00)
Glucose, Bld: 88 mg/dL (ref 70–99)
Potassium: 4.4 mEq/L (ref 3.5–5.1)
Sodium: 137 mEq/L (ref 135–145)

## 2014-01-26 LAB — HEPATIC FUNCTION PANEL
ALT: 15 U/L (ref 0–53)
AST: 17 U/L (ref 0–37)
Albumin: 3.9 g/dL (ref 3.5–5.2)
Alkaline Phosphatase: 39 U/L (ref 39–117)
Bilirubin, Direct: 0 mg/dL (ref 0.0–0.3)
Total Bilirubin: 0.7 mg/dL (ref 0.3–1.2)
Total Protein: 7.6 g/dL (ref 6.0–8.3)

## 2014-01-26 LAB — URIC ACID: Uric Acid, Serum: 10.3 mg/dL — ABNORMAL HIGH (ref 4.0–7.8)

## 2014-01-26 LAB — LIPID PANEL
Cholesterol: 123 mg/dL (ref 0–200)
HDL: 22.2 mg/dL — ABNORMAL LOW (ref >39.00)
LDL Cholesterol: 73 mg/dL (ref 0–99)
Total CHOL/HDL Ratio: 6
Triglycerides: 141 mg/dL (ref 0.0–149.0)
VLDL: 28.2 mg/dL (ref 0.0–40.0)

## 2014-01-26 LAB — TROPONIN I: Troponin I: <0.3 ng/mL (ref <0.30)

## 2014-01-26 MED ORDER — NITROGLYCERIN 0.4 MG SL SUBL: 0.4000 mg | SUBLINGUAL_TABLET | SUBLINGUAL | Status: DC | PRN

## 2014-01-26 NOTE — Telephone Encounter (Signed)
Call from lab with stat troponin results, printed and taken to Truitt Merle, NP

## 2014-01-26 NOTE — Patient Instructions (Addendum)
Stay on your current medicines  We need to check labs today  Weigh yourself each morning and record.  Limit sodium intake. Goal is to have less than 2000 mg (2gm) of salt per day.  Limit your oral intake of potassium (less bananas)  Referral to [facility] for primary care  Referral to [facility]ermatology for lesion on the nose  Use your NTG under your tongue for recurrent chest pain. May take one tablet every 5 minutes. If you are still having discomfort after 3 tablets in 15 minutes, call 911.  Needs echo after April 8th  Follow up with Dr. Martinique after echo  Call the [place] office at [phone] if you have any questions, problems or concerns.

## 2014-01-26 NOTE — Telephone Encounter (Signed)
Patient assistant form completed by MD faxed to  [facility] for BRILINTA 90 mg

## 2014-01-26 NOTE — Progress Notes (Addendum)
Kerry Mason Date of Birth: 03-08-1956 Medical Record #[id number]  History of Present Illness: Kerry Mason is seen back today for a follow up visit. He has known CAD with remote stenting of the RCA in 2000 and then lost to follow up. Had STEMI in January of 2015 - with cath showing occlusion of the proximal RCA with collaterals and occlusion of the proximal LAD. RCA occlusion felt to be chronic. EF 35% by cath and 30% by echo. Proximal and mid to distal LAD were stented with DES. This was associated with acute renal insufficiency that resolved. He has a life vest in place.   Other issues include tobacco abuse, HLD, and HTN.  Seen a month ago - felt to be doing ok. Coreg was increased. Dr. Martinique released him back to work.   Comes back today. Here with his "friend". Doing ok. Feeling good until yesterday. Continues to have some spells of dyspnea - felt to be from the [place] but he does not wish to stop. Has been out in the cold. Yesterday had back pain - somewhat similar to his chest pain presentation associated with his MI - was "a little lower" but not associated with any other symptoms. Did not use NTG. Lasted for about 3 to 4 hours. Feels fine now. Eating lots of bananas. Potassium on higher end of normal. Not dizzy or lightheaded. BP too low with the higher dose of Coreg so he is currently on 9.75 mg BID.    Current Outpatient Prescriptions  Medication Sig Dispense Refill  . aspirin EC 81 MG tablet Take 81 mg by mouth daily.      . carvedilol (COREG) 6.25 MG tablet Take 11/2 twice a day  100 tablet  6  . lisinopril (PRINIVIL,ZESTRIL) 10 MG tablet Take 1 tablet (10 mg total) by mouth daily.  90 tablet  3  . nitroGLYCERIN (NITROSTAT) 0.4 MG SL tablet Place 1 tablet (0.4 mg total) under the tongue every 5 (five) minutes as needed for chest pain.  25 tablet  0  . pravastatin (PRAVACHOL) 40 MG tablet Take 1 tablet (40 mg total) by mouth daily.  30 tablet  0  . spironolactone (ALDACTONE) 25  MG tablet Take 1 tablet (25 mg total) by mouth daily.  90 tablet  3  . Ticagrelor (BRILINTA) 90 MG TABS tablet Take 1 tablet (90 mg total) by mouth 2 (two) times daily.  60 tablet  0   No current facility-administered medications for this visit.    No Known Allergies  Past Medical History  Diagnosis Date  . CAD (coronary artery disease)     a. 2000: s/p stent of RCA 2000 with BMS  . Tobacco abuse   . Obesity   . ST elevation myocardial infarction (STEMI) of anterior wall, subsequent episode of care   . HTN (hypertension)   . Hyperlipidemia   . Acute systolic CHF (congestive heart failure)   . Ischemic cardiomyopathy     Past Surgical History  Procedure Laterality Date  . Cardiac catheterization    . Coronary angioplasty      History  Smoking status  . Former Smoker -- 1.00 packs/day for 35 years  . Types: Cigarettes  . Quit date: 12/11/2013  Smokeless tobacco  . Not on file    History  Alcohol Use No    Family History  Problem Relation Age of Onset  . CAD Father     PTCA    Review of Systems: The review of systems  is per the HPI.  All other systems were reviewed and are negative.  Physical Exam: BP 130/80  Pulse 68  Ht 6\' 2"  (1.88 m)  Wt 239 lb (108.41 kg)  BMI 30.67 kg/m2 Patient is very pleasant and in no acute distress. Skin is warm and dry. Color is normal.  HEENT is unremarkable. Normocephalic/atraumatic. PERRL. Sclera are nonicteric. Neck is supple. No masses. No JVD. Lungs are clear. Cardiac exam shows a regular rate and rhythm. Abdomen is soft. Extremities are without edema. Gait and ROM are intact. No gross neurologic deficits noted.  LABORATORY DATA: EKG pending  Labs pending  Lab Results  Component Value Date   WBC 12.5* 12/12/2013   HGB 16.3 12/12/2013   HCT 45.8 12/12/2013   PLT 247 12/12/2013   GLUCOSE 91 12/22/2013   CHOL 160 12/12/2013   TRIG 109 12/12/2013   HDL 28* 12/12/2013   LDLCALC 110* 12/12/2013   ALT 53 12/13/2013   AST 173* 12/13/2013    NA 133* 12/22/2013   K 4.8 12/22/2013   CL 100 12/22/2013   CREATININE 1.2 12/22/2013   BUN 19 12/22/2013   CO2 25 12/22/2013   TSH 1.265 12/11/2013   INR 0.92 12/11/2013   HGBA1C 5.8* 12/11/2013   Coronary angiography:  Coronary dominance: right  Left mainstem: The left main is large with bulky eccentric 50% stenosis distally.  Left anterior descending (LAD): 100% occluded proximally. Moderate calcification.  [place] intermediate: large bifurcating vessel. Normal.  Left circumflex (LCx): Small, normal.  Right coronary artery (RCA): 100% occluded proximally with left to right and right to right collaterals.  Left ventriculography: Left ventricular systolic function is abnormal, There is severe hypokinesis of the anterior wall, apex, and distal inferior wall. LVEF is estimated at 35%, there is no significant mitral regurgitation  PCI Note: Following the diagnostic procedure, the decision was made to proceed with PCI. Based on his Ecg findings I felt the LAD was his culprit and the RCA occlusion was chronic. Weight-based bivalirudin was given for anticoagulation. Brilinta 180 mg was given orally.Once a therapeutic ACT was achieved, a 6 [place] XBLAD 3.5 guide catheter was inserted. A prowater coronary guidewire was used to cross the lesion. The lesion was predilated with a 2.5 mm balloon. With reperfusion the LAD was noted to be a very large vessel with a long segment of disease in the proximal vessel. There was also a focal 80-90% stenosis in the mid vessel.The proximal lesion was then stented with a 4.0 x38 mm Promus stent. The mid vessel was stented with a 3.0 x 16 mm Promus stent. Following PCI, there was 0% residual stenosis and TIMI-3 flow. Final angiography confirmed an excellent result. The patient tolerated the procedure well and was pain free at its conclusion. There were no immediate procedural complications. A TR band was used for radial hemostasis. The patient was transferred to the post  catheterization recovery area for further monitoring.  PCI Data:  Vessel - LAD/Segment - proximal  Percent Stenosis (pre) 100%  TIMI-flow 0  Stent 4.0 x 38 mm Promus proximal, 3.0 x 16 mm Promus mid.  Percent Stenosis (post) 0%  TIMI-flow (post) 3  Final Conclusions:  1. Severe 2 vessel occlusive CAD. Culprit is occlusion of the proximal LAD. Chronic occlusion of the RCA with collaterals.  2. Moderate to severe LV dysfunction.  3. Successful stenting of the proximal and mid LAD with DES.  Recommendations:  Dual antiplatelet therapy for at least one year. Patient will be monitored  in ICU. Continue IV Ntg for now. Add beta blocker, statin and ACEi. Will need Echo prior to DC.  [name] [facility]  12/11/2013, 9:52 AM  Echo Study Conclusions  - Left ventricle: The cavity size was normal. Wall thickness was increased in a pattern of moderate LVH. The estimated ejection fraction was 30%. The inferoseptal, anteroseptal, and anterior walls were severely hypokinetic. Akinesis of the apical inferior wall segment and the true apex. The basal to mid inferior wall segments were hypokinetic. Doppler parameters are consistent with abnormal left ventricular relaxation (grade 1 diastolic dysfunction). - Aortic valve: There was no stenosis. - Mitral valve: Mildly calcified annulus. Normal thickness leaflets . Trivial regurgitation. - Left atrium: The atrium was mildly dilated. - Right ventricle: The cavity size was normal. Systolic function was normal. - Tricuspid valve: Peak RV-RA gradient: 71mm Hg (S). - Pulmonary arteries: PA peak pressure: 3mm Hg (S). - Inferior vena cava: The vessel was normal in size; the respirophasic diameter changes were in the normal range (= 50%); findings are consistent with normal central venous pressure. - Pericardium, extracardiac: A trivial pericardial effusion was identified.  Assessment / Plan: 1. Anterior STEMI s/p stenting of the proximal and mid-distal  LAD with DES. .Presentation was relatively late. Continue dual antiplatelet therapy with ASA and Brilinta. Dyspnea improved but not resolved - he wishes to continue. His spell of "back pain" yesterday is worrisome. Will check a troponin. Advised to use NTG as needed.  Patient is at high risk for arrhythmia. Continue Lifevest.   EKG today is reviewed with Dr. Martinique - looks better with evolving changes.   2. CHF chronic systolic. Ischemic cardiomyopathy with EF 30-35%. No evidence of volume overload today. Continue aldactone and lisinopril at current dose - advised to cut back on his oral intake of potassium. Not able to tolerate further increase in his medicines.  Repeat Echo after April 8th. If EF remains less than 35% will need to consider ICD. Reminded to watch his sodium intake.   3. HTN controlled   4. Hyperlipidemia. On pravastatin. Check fasting labs today.   5. Tobacco abuse. Continue cessation. He says he is not smoking.   6. CAD chronic occlusion of RCA with good collaterals. Manage medically.  Plan for echo after April 8th. Follow up with Dr. Martinique after his echo. Asking for PCP - will try to send to [facility]. Also concerned about a spot on his nose - will refer to dermatology. Also asking for medicine for gout - will check uric acid.  Patient is agreeable to this plan and will call if any problems develop in the interim.   Burtis Junes, RN, [place] [address] [place], [place]  [id number] [phone]

## 2014-01-27 ENCOUNTER — Other Ambulatory Visit: Payer: Self-pay | Admitting: *Deleted

## 2014-01-27 MED ORDER — ALLOPURINOL 300 MG PO TABS: 300.0000 mg | ORAL_TABLET | Freq: Every day | ORAL | Status: DC

## 2014-01-27 MED ORDER — COLCHICINE 0.6 MG PO TABS: 0.6000 mg | ORAL_TABLET | Freq: Two times a day (BID) | ORAL | Status: DC | PRN

## 2014-03-12 ENCOUNTER — Other Ambulatory Visit: Payer: Self-pay

## 2014-03-12 DIAGNOSIS — I255 Ischemic cardiomyopathy: Secondary | ICD-10-CM

## 2014-03-12 DIAGNOSIS — I2109 ST elevation (STEMI) myocardial infarction involving other coronary artery of anterior wall: Secondary | ICD-10-CM

## 2014-03-12 DIAGNOSIS — I2510 Atherosclerotic heart disease of native coronary artery without angina pectoris: Secondary | ICD-10-CM

## 2014-03-13 ENCOUNTER — Ambulatory Visit (HOSPITAL_COMMUNITY): Payer: BLUE CROSS/BLUE SHIELD | Attending: Cardiology | Admitting: Radiology

## 2014-03-13 DIAGNOSIS — I2109 ST elevation (STEMI) myocardial infarction involving other coronary artery of anterior wall: Secondary | ICD-10-CM | POA: Diagnosis not present

## 2014-03-13 DIAGNOSIS — I2589 Other forms of chronic ischemic heart disease: Secondary | ICD-10-CM

## 2014-03-13 DIAGNOSIS — I2510 Atherosclerotic heart disease of native coronary artery without angina pectoris: Secondary | ICD-10-CM

## 2014-03-13 DIAGNOSIS — I255 Ischemic cardiomyopathy: Secondary | ICD-10-CM

## 2014-03-13 NOTE — Progress Notes (Signed)
Echocardiogram performed.  

## 2014-03-17 ENCOUNTER — Encounter: Payer: Self-pay | Admitting: Cardiology

## 2014-03-17 ENCOUNTER — Other Ambulatory Visit: Payer: Self-pay

## 2014-03-17 ENCOUNTER — Ambulatory Visit (INDEPENDENT_AMBULATORY_CARE_PROVIDER_SITE_OTHER): Payer: BC Managed Care – PPO | Admitting: Cardiology

## 2014-03-17 VITALS — BP 130/84 | HR 69 | Ht 74.0 in | Wt 234.0 lb

## 2014-03-17 DIAGNOSIS — F17200 Nicotine dependence, unspecified, uncomplicated: Secondary | ICD-10-CM

## 2014-03-17 DIAGNOSIS — I2109 ST elevation (STEMI) myocardial infarction involving other coronary artery of anterior wall: Secondary | ICD-10-CM

## 2014-03-17 DIAGNOSIS — I509 Heart failure, unspecified: Secondary | ICD-10-CM

## 2014-03-17 DIAGNOSIS — I2510 Atherosclerotic heart disease of native coronary artery without angina pectoris: Secondary | ICD-10-CM

## 2014-03-17 DIAGNOSIS — E785 Hyperlipidemia, unspecified: Secondary | ICD-10-CM

## 2014-03-17 DIAGNOSIS — Z720 Tobacco use: Secondary | ICD-10-CM

## 2014-03-17 DIAGNOSIS — I5022 Chronic systolic (congestive) heart failure: Secondary | ICD-10-CM

## 2014-03-17 MED ORDER — CARVEDILOL 6.25 MG PO TABS: ORAL_TABLET | ORAL | Status: DC

## 2014-03-17 NOTE — Progress Notes (Signed)
Kerry Mason Date of Birth: September 09, 1956 Medical Record #[id number]  History of Present Illness: Kerry Mason is seen back today for a follow up visit. He has known CAD with remote stenting of the RCA in 2000 and then lost to follow up. Had STEMI in January of 2015 - with cath showing occlusion of the proximal RCA with collaterals and occlusion of the proximal LAD. RCA occlusion felt to be chronic. EF 35% by cath and 30% by echo. Proximal and mid to distal LAD were stented with DES. This was associated with acute renal insufficiency that resolved. He has a life vest in place.   Other issues include tobacco abuse, HLD, and HTN. He quit smoking at the time of his MI. He is back working now.  On follow up today he is doing very well. He denies any chest pain. No events on lifevest. Feels occ flutters and SOB but these are fleeting and infrequent. Occ. Feelings of fatigue.  Current Outpatient Prescriptions  Medication Sig Dispense Refill  . allopurinol (ZYLOPRIM) 300 MG tablet Take 1 tablet (300 mg total) by mouth daily.  30 tablet  6  . aspirin EC 81 MG tablet Take 81 mg by mouth daily.      . carvedilol (COREG) 6.25 MG tablet Take 11/2 twice a day  100 tablet  6  . colchicine 0.6 MG tablet Take 1 tablet (0.6 mg total) by mouth 2 (two) times daily as needed (For Gout Flare Ups only).  60 tablet  3  . lisinopril (PRINIVIL,ZESTRIL) 10 MG tablet Take 1 tablet (10 mg total) by mouth daily.  90 tablet  3  . nitroGLYCERIN (NITROSTAT) 0.4 MG SL tablet Place 1 tablet (0.4 mg total) under the tongue every 5 (five) minutes as needed for chest pain.  25 tablet  1  . pravastatin (PRAVACHOL) 40 MG tablet Take 1 tablet (40 mg total) by mouth daily.  30 tablet  0  . spironolactone (ALDACTONE) 25 MG tablet Take 1 tablet (25 mg total) by mouth daily.  90 tablet  3  . Ticagrelor (BRILINTA) 90 MG TABS tablet Take 1 tablet (90 mg total) by mouth 2 (two) times daily.  60 tablet  0   No current  facility-administered medications for this visit.    No Known Allergies  Past Medical History  Diagnosis Date  . CAD (coronary artery disease)     a. 2000: s/p stent of RCA 2000 with BMS  . Tobacco abuse   . Obesity   . ST elevation myocardial infarction (STEMI) of anterior wall, subsequent episode of care   . HTN (hypertension)   . Hyperlipidemia   . Acute systolic CHF (congestive heart failure)   . Ischemic cardiomyopathy     Past Surgical History  Procedure Laterality Date  . Cardiac catheterization    . Coronary angioplasty  2000  . Coronary angioplasty with stent placement  2015    History  Smoking status  . Former Smoker -- 1.00 packs/day for 35 years  . Types: Cigarettes  . Quit date: 12/11/2013  Smokeless tobacco  . Not on file    History  Alcohol Use No    Family History  Problem Relation Age of Onset  . CAD Father     PTCA    Review of Systems: The review of systems is per the HPI.  All other systems were reviewed and are negative.  Physical Exam: BP 130/84  Pulse 69  Ht 6\' 2"  (1.88 m)  Wt 234  lb (106.142 kg)  BMI 30.03 kg/m2  SpO2 97% Patient is very pleasant and in no acute distress. Skin is warm and dry. Color is normal.  HEENT is unremarkable. Normocephalic/atraumatic. PERRL. Sclera are nonicteric. Neck is supple. No masses. No JVD. Lungs are clear. Cardiac exam shows a regular rate and rhythm. Abdomen is soft. Extremities are without edema. Gait and ROM are intact. No gross neurologic deficits noted.  LABORATORY DATA:   Echo: 03/13/14:Study Conclusions  - Left ventricle: Global strain calculated at -8% which is probably inaccurate. The LV walls do not appear to be accurately tracking The cavity size was mildly dilated. There was moderate concentric hypertrophy. Systolic function was moderately to severely reduced. The estimated ejection fraction was in the range of 30% to 35%. There is akinesis of the mid-distalanteroseptal myocardium.  There was an increased relative contribution of atrial contraction to ventricular filling. Doppler parameters are consistent with abnormal left ventricular relaxation (grade 1 diastolic dysfunction). - Aortic valve: Moderate thickening and calcification, consistent with sclerosis. - Aorta: Ascending aortic diameter: 10mm (S). - Ascending aorta: The ascending aorta was mildly dilated. - Left atrium: The atrium was moderately dilated.     Labs pending  Lab Results  Component Value Date   WBC 12.5* 12/12/2013   HGB 16.3 12/12/2013   HCT 45.8 12/12/2013   PLT 247 12/12/2013   GLUCOSE 88 01/26/2014   CHOL 123 01/26/2014   TRIG 141.0 01/26/2014   HDL 22.20* 01/26/2014   LDLCALC 73 01/26/2014   ALT 15 01/26/2014   AST 17 01/26/2014   NA 137 01/26/2014   K 4.4 01/26/2014   CL 108 01/26/2014   CREATININE 0.9 01/26/2014   BUN 16 01/26/2014   CO2 23 01/26/2014   TSH 1.265 12/11/2013   INR 0.92 12/11/2013   HGBA1C 5.8* 12/11/2013   Coronary angiography:  Coronary dominance: right  Left mainstem: The left main is large with bulky eccentric 50% stenosis distally.  Left anterior descending (LAD): 100% occluded proximally. Moderate calcification.  Ramus intermediate: large bifurcating vessel. Normal.  Left circumflex (LCx): Small, normal.  Right coronary artery (RCA): 100% occluded proximally with left to right and right to right collaterals.  Left ventriculography: Left ventricular systolic function is abnormal, There is severe hypokinesis of the anterior wall, apex, and distal inferior wall. LVEF is estimated at 35%, there is no significant mitral regurgitation  PCI Note: Following the diagnostic procedure, the decision was made to proceed with PCI. Based on his Ecg findings I felt the LAD was his culprit and the RCA occlusion was chronic. Weight-based bivalirudin was given for anticoagulation. Brilinta 180 mg was given orally.Once a therapeutic ACT was achieved, a 6 [place] XBLAD 3.5 guide catheter was  inserted. A prowater coronary guidewire was used to cross the lesion. The lesion was predilated with a 2.5 mm balloon. With reperfusion the LAD was noted to be a very large vessel with a long segment of disease in the proximal vessel. There was also a focal 80-90% stenosis in the mid vessel.The proximal lesion was then stented with a 4.0 x38 mm Promus stent. The mid vessel was stented with a 3.0 x 16 mm Promus stent. Following PCI, there was 0% residual stenosis and TIMI-3 flow. Final angiography confirmed an excellent result. The patient tolerated the procedure well and was pain free at its conclusion. There were no immediate procedural complications. A TR band was used for radial hemostasis. The patient was transferred to the post catheterization recovery area for further monitoring.  PCI Data:  Vessel - LAD/Segment - proximal  Percent Stenosis (pre) 100%  TIMI-flow 0  Stent 4.0 x 38 mm Promus proximal, 3.0 x 16 mm Promus mid.  Percent Stenosis (post) 0%  TIMI-flow (post) 3  Final Conclusions:  1. Severe 2 vessel occlusive CAD. Culprit is occlusion of the proximal LAD. Chronic occlusion of the RCA with collaterals.  2. Moderate to severe LV dysfunction.  3. Successful stenting of the proximal and mid LAD with DES.  Recommendations:  Dual antiplatelet therapy for at least one year. Patient will be monitored in ICU. Continue IV Ntg for now. Add beta blocker, statin and ACEi. Will need Echo prior to DC.  [name] [facility]  12/11/2013, 9:52 AM   Assessment / Plan: 1. Anterior STEMI s/p stenting of the proximal and mid-distal LAD with DES. Presentation was relatively late. Continue dual antiplatelet therapy with ASA and Brilinta for one year.  He is on optimal medical therapy.  2. CHF chronic systolic. Ischemic cardiomyopathy with EF 30-35%. No evidence of volume overload today. Continue aldactone and lisinopril at current dose. Not able to tolerate further increase in his medicines.  Since EF  remains low I have recommended a prophylactic ICD. He will see Dr. [name] on May 5 to discuss. Reminded to watch his sodium intake.   3. HTN controlled   4. Hyperlipidemia. On pravastatin.   5. Tobacco abuse. Continue cessation. He says he is not smoking.   6. CAD chronic occlusion of RCA with good collaterals. Manage medically.

## 2014-03-17 NOTE — Patient Instructions (Signed)
Continue your current therapy  You will see Dr. [name] May 5 for consideration of an ICD  I will see you in 4 months.

## 2014-03-26 ENCOUNTER — Ambulatory Visit (HOSPITAL_COMMUNITY): Payer: BC Managed Care – PPO

## 2014-03-26 ENCOUNTER — Encounter (HOSPITAL_COMMUNITY)
Admission: RE | Admit: 2014-03-26 | Discharge: 2014-03-26 | Disposition: A | Discharge status: Home / Self Care | Payer: BC Managed Care – PPO | Source: Ambulatory Visit | Attending: Cardiology | Admitting: Cardiology

## 2014-03-26 DIAGNOSIS — I2589 Other forms of chronic ischemic heart disease: Secondary | ICD-10-CM | POA: Insufficient documentation

## 2014-03-26 DIAGNOSIS — I10 Essential (primary) hypertension: Secondary | ICD-10-CM | POA: Insufficient documentation

## 2014-03-26 DIAGNOSIS — Z5189 Encounter for other specified aftercare: Secondary | ICD-10-CM | POA: Insufficient documentation

## 2014-03-26 DIAGNOSIS — I5022 Chronic systolic (congestive) heart failure: Secondary | ICD-10-CM | POA: Insufficient documentation

## 2014-03-26 DIAGNOSIS — I509 Heart failure, unspecified: Secondary | ICD-10-CM | POA: Insufficient documentation

## 2014-03-26 DIAGNOSIS — I219 Acute myocardial infarction, unspecified: Secondary | ICD-10-CM | POA: Insufficient documentation

## 2014-03-26 DIAGNOSIS — I2510 Atherosclerotic heart disease of native coronary artery without angina pectoris: Secondary | ICD-10-CM | POA: Insufficient documentation

## 2014-03-26 DIAGNOSIS — Z9861 Coronary angioplasty status: Secondary | ICD-10-CM | POA: Insufficient documentation

## 2014-03-26 NOTE — Progress Notes (Signed)
Cardiac Rehab Medication Review by a Pharmacist  Does the patient  feel that his/her medications are working for him/her?  yes  Has the patient been experiencing any side effects to the medications prescribed?  No, but patient does report recent headaches since heart attack.  Does the patient measure his/her own blood pressure or blood glucose at home?  yes   Does the patient have any problems obtaining medications due to transportation or finances?   no  Understanding of regimen: good Understanding of indications: good Potential of compliance: good    Pharmacist comments: Patient also reports taking a topical cream at home daily as needed for itching/rash.  But does not know the name.  He currently does not take other OTC or supplements, and no other topicals/eyes or ear drops.  Has a MD appointment coming up.  No questions at this time.  Jeronimo Norma, PharmD Clinical Pharmacist Resident Pager: [phone]  Jeronimo Norma 03/26/2014 8:24 AM

## 2014-03-30 ENCOUNTER — Encounter (HOSPITAL_COMMUNITY)
Admission: RE | Admit: 2014-03-30 | Discharge: 2014-03-30 | Disposition: A | Discharge status: Home / Self Care | Payer: BC Managed Care – PPO | Source: Ambulatory Visit | Attending: Cardiology | Admitting: Cardiology

## 2014-03-30 NOTE — Progress Notes (Addendum)
Kerry Mason  Reported here today for his first day of exercise at cardiac rehab. Kerry Mason reported having some mid sternal chest discomfort which he rated a 2 on a 1-10 scale that lasted about a minute this morning while driving at work. Lorretta Harp PA called and notifed said patient is okay to exercise. Will notify MD if patient has any reoccur ance during exercise today.

## 2014-03-30 NOTE — Progress Notes (Signed)
Kerry Mason tolerated his first day of exercise at cardiac rehab without any complaints or difficulty.  Kerry Mason reported having  chest discomfort this morning while driving at work. No complaints or chest pain noted this afternoon during exercise. Telemetry rhythm Sinus. Vital signs stable.Will continue to monitor the patient throughout  the program.

## 2014-04-01 ENCOUNTER — Encounter (HOSPITAL_COMMUNITY)
Admission: RE | Admit: 2014-04-01 | Discharge: 2014-04-01 | Disposition: A | Discharge status: Home / Self Care | Payer: BC Managed Care – PPO | Source: Ambulatory Visit | Attending: Cardiology | Admitting: Cardiology

## 2014-04-03 ENCOUNTER — Encounter (HOSPITAL_COMMUNITY): Payer: BC Managed Care – PPO

## 2014-04-06 ENCOUNTER — Encounter (HOSPITAL_COMMUNITY)
Admission: RE | Admit: 2014-04-06 | Discharge: 2014-04-06 | Disposition: A | Discharge status: Home / Self Care | Payer: BC Managed Care – PPO | Source: Ambulatory Visit | Attending: Cardiology | Admitting: Cardiology

## 2014-04-06 DIAGNOSIS — Z5189 Encounter for other specified aftercare: Secondary | ICD-10-CM | POA: Insufficient documentation

## 2014-04-06 DIAGNOSIS — Z9861 Coronary angioplasty status: Secondary | ICD-10-CM | POA: Insufficient documentation

## 2014-04-06 DIAGNOSIS — I219 Acute myocardial infarction, unspecified: Secondary | ICD-10-CM | POA: Insufficient documentation

## 2014-04-07 ENCOUNTER — Encounter: Payer: Self-pay | Admitting: *Deleted

## 2014-04-07 ENCOUNTER — Encounter: Payer: Self-pay | Admitting: Internal Medicine

## 2014-04-07 ENCOUNTER — Ambulatory Visit (INDEPENDENT_AMBULATORY_CARE_PROVIDER_SITE_OTHER): Payer: BC Managed Care – PPO | Admitting: Internal Medicine

## 2014-04-07 VITALS — BP 128/82 | HR 71 | Ht 73.0 in | Wt 230.0 lb

## 2014-04-07 DIAGNOSIS — I2589 Other forms of chronic ischemic heart disease: Secondary | ICD-10-CM

## 2014-04-07 DIAGNOSIS — I509 Heart failure, unspecified: Secondary | ICD-10-CM

## 2014-04-07 DIAGNOSIS — I255 Ischemic cardiomyopathy: Secondary | ICD-10-CM

## 2014-04-07 DIAGNOSIS — I5022 Chronic systolic (congestive) heart failure: Secondary | ICD-10-CM

## 2014-04-07 LAB — BASIC METABOLIC PANEL
BUN: 14 mg/dL (ref 6–23)
CHLORIDE: 106 meq/L (ref 96–112)
CO2: 26 mEq/L (ref 19–32)
CREATININE: 0.8 mg/dL (ref 0.4–1.5)
Calcium: 9.5 mg/dL (ref 8.4–10.5)
GFR: 102.55 mL/min (ref >60.00)
Glucose, Bld: 82 mg/dL (ref 70–99)
POTASSIUM: 4.2 meq/L (ref 3.5–5.1)
SODIUM: 137 meq/L (ref 135–145)

## 2014-04-07 LAB — CBC WITH DIFFERENTIAL/PLATELET
BASOS PCT: 0.9 % (ref 0.0–3.0)
Basophils Absolute: 0.1 10*3/uL (ref 0.0–0.1)
EOS PCT: 3.8 % (ref 0.0–5.0)
Eosinophils Absolute: 0.4 10*3/uL (ref 0.0–0.7)
HCT: 43 % (ref 39.0–52.0)
Hemoglobin: 14.5 g/dL (ref 13.0–17.0)
LYMPHS PCT: 36.6 % (ref 12.0–46.0)
Lymphs Abs: 3.9 10*3/uL (ref 0.7–4.0)
MCHC: 33.7 g/dL (ref 30.0–36.0)
MCV: 92.7 fl (ref 78.0–100.0)
Monocytes Absolute: 0.6 10*3/uL (ref 0.1–1.0)
Monocytes Relative: 5.7 % (ref 3.0–12.0)
NEUTROS PCT: 53 % (ref 43.0–77.0)
Neutro Abs: 5.6 10*3/uL (ref 1.4–7.7)
Platelets: 304 10*3/uL (ref 150.0–400.0)
RBC: 4.64 Mil/uL (ref 4.22–5.81)
RDW: 16.8 % — ABNORMAL HIGH (ref 11.5–15.5)
WBC: 10.7 10*3/uL — ABNORMAL HIGH (ref 4.0–10.5)

## 2014-04-07 NOTE — Patient Instructions (Addendum)
Your physician has recommended that you have a defibrillator inserted. An implantable cardioverter defibrillator (ICD) is a small device that is placed in your chest or, in rare cases, your abdomen. This device uses electrical pulses or shocks to help control life-threatening, irregular heartbeats that could lead the heart to suddenly stop beating (sudden cardiac arrest). Leads are attached to the ICD that goes into your heart. This is done in the hospital and usually requires an overnight stay. Please see the instruction sheet given to you today for more information.   See instruction sheet for Implant  Your physician recommends that you schedule a follow-up would check after 04/27/14

## 2014-04-07 NOTE — Progress Notes (Signed)
HPI Kerry Mason is referred today for consideration for ICD implantation. He is a pleasant middle aged man with known CAD, s/p MI, with an EF 30% despite maximal medical therapy after revascularization 4 months ago. He has worn a life vest with no shocks. He has class 2. CHF symptoms.  No Known Allergies   Current Outpatient Prescriptions  Medication Sig Dispense Refill  . allopurinol (ZYLOPRIM) 300 MG tablet Take 1 tablet (300 mg total) by mouth daily.  30 tablet  6  . aspirin EC 81 MG tablet Take 81 mg by mouth daily.      . carvedilol (COREG) 6.25 MG tablet Take 1 1/2 tablets  twice a day      . CREAM BASE EX Apply 1 application topically daily as needed (for itching). Topical cream daily as needed      . lisinopril (PRINIVIL,ZESTRIL) 10 MG tablet Take 1 tablet (10 mg total) by mouth daily.  90 tablet  3  . nitroGLYCERIN (NITROSTAT) 0.4 MG SL tablet Place 1 tablet (0.4 mg total) under the tongue every 5 (five) minutes as needed for chest pain.  25 tablet  1  . pravastatin (PRAVACHOL) 40 MG tablet Take 1 tablet (40 mg total) by mouth daily.  30 tablet  0  . spironolactone (ALDACTONE) 25 MG tablet Take 1 tablet (25 mg total) by mouth daily.  90 tablet  3  . Ticagrelor (BRILINTA) 90 MG TABS tablet Take 1 tablet (90 mg total) by mouth 2 (two) times daily.  60 tablet  0   No current facility-administered medications for this visit.     Past Medical History  Diagnosis Date  . CAD (coronary artery disease)     a. 2000: s/p stent of RCA 2000 with BMS  . Tobacco abuse   . Obesity   . ST elevation myocardial infarction (STEMI) of anterior wall, subsequent episode of care   . HTN (hypertension)   . Hyperlipidemia   . Acute systolic CHF (congestive heart failure)   . Ischemic cardiomyopathy     ROS:   All systems reviewed and negative except as noted in the HPI.   Past Surgical History  Procedure Laterality Date  . Cardiac catheterization    . Coronary angioplasty  2000    . Coronary angioplasty with stent placement  2015     Family History  Problem Relation Age of Onset  . CAD Father     PTCA     History   Social History  . Marital Status: Married    Spouse Name: N/A    Number of Children: 2  . 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 Smoking status: Former Smoker -- 1.00 packs/day for 35 years    Types: Cigarettes    Quit date: 12/11/2013  . Smokeless tobacco: Not on file  . Alcohol Use: No  . Drug Use: No  . Sexual Activity: Not Currently   Other Topics Concern  . Not on file   Social History Narrative  . No narrative on file     BP 128/82  Pulse 71  Ht 6\' 1"  (1.854 m)  Wt 230 lb (104.327 kg)  BMI 30.35 kg/m2  Physical Exam:  Well appearing middle aged man, NAD HEENT: Unremarkable Neck:  No JVD, no thyromegally Back:  No CVA tenderness Lungs:  Clear with no wheezes HEART:  Regular rate rhythm, no murmurs,  no rubs, no clicks Abd:  soft, positive bowel sounds, no organomegally, no rebound, no guarding Ext:  2 plus pulses, no edema, no cyanosis, no clubbing Skin:  No rashes no nodules Neuro:  CN II through XII intact, motor grossly intact  EKG - reviewed, NSR   Assess/Plan:

## 2014-04-07 NOTE — Assessment & Plan Note (Signed)
His CHF is improved, now class 2. He will continue his maximal medical therapy. His EF is 30%.

## 2014-04-07 NOTE — Assessment & Plan Note (Signed)
His EF is 30%. He denies anginal symptoms. Will schedule ICD implant as he is now 4 months out from his MI/revascularization and has been on maximal medical therapy.

## 2014-04-08 ENCOUNTER — Encounter (HOSPITAL_COMMUNITY)
Admission: RE | Admit: 2014-04-08 | Discharge: 2014-04-08 | Disposition: A | Discharge status: Home / Self Care | Payer: BC Managed Care – PPO | Source: Ambulatory Visit | Attending: Cardiology | Admitting: Cardiology

## 2014-04-08 NOTE — Progress Notes (Signed)
Reviewed home exercise with pt today.  Pt plans to walk and ride bike at home for exercise.  Reviewed THR, pulse, RPE, sign and symptoms, NTG use, and when to call 911 or MD.  Pt voiced understanding. Alberteen Sam, MA, ACSM RCEP

## 2014-04-09 ENCOUNTER — Encounter (HOSPITAL_COMMUNITY): Payer: Self-pay | Admitting: Pharmacy Technician

## 2014-04-09 NOTE — Progress Notes (Signed)
Reviewed patients quality of life questionnaire yesterday.  Kerry Mason denies being depressed.  Patient given a vocational rehab packet to complete. [name] is interested in job retraining.

## 2014-04-10 ENCOUNTER — Encounter (HOSPITAL_COMMUNITY)
Admission: RE | Admit: 2014-04-10 | Discharge: 2014-04-10 | Disposition: A | Discharge status: Home / Self Care | Payer: BC Managed Care – PPO | Source: Ambulatory Visit | Attending: Cardiology | Admitting: Cardiology

## 2014-04-13 ENCOUNTER — Encounter (HOSPITAL_COMMUNITY): Admission: RE | Admit: 2014-04-13 | Payer: BC Managed Care – PPO | Source: Ambulatory Visit

## 2014-04-15 ENCOUNTER — Encounter (HOSPITAL_COMMUNITY): Payer: BC Managed Care – PPO

## 2014-04-16 MED ORDER — CHLORHEXIDINE GLUCONATE 4 % EX LIQD
60.0000 mL | Freq: Once | CUTANEOUS | Status: DC
  Filled 2014-04-16: qty 60

## 2014-04-16 MED ORDER — SODIUM CHLORIDE 0.9 % IR SOLN
80.0000 mg | Status: DC
  Filled 2014-04-16: qty 2

## 2014-04-16 MED ORDER — SODIUM CHLORIDE 0.9 % IV SOLN
INTRAVENOUS | Status: DC
  Administered 2014-04-17: 07:00:00 via INTRAVENOUS

## 2014-04-16 MED ORDER — CEFAZOLIN SODIUM-DEXTROSE 2-3 GM-% IV SOLR
2.0000 g | INTRAVENOUS | Status: DC
  Filled 2014-04-16: qty 50

## 2014-04-17 ENCOUNTER — Telehealth: Payer: Self-pay | Admitting: Cardiology

## 2014-04-17 ENCOUNTER — Encounter (HOSPITAL_COMMUNITY): Payer: BC Managed Care – PPO

## 2014-04-17 ENCOUNTER — Encounter (HOSPITAL_COMMUNITY)
Admission: RE | Disposition: A | Discharge status: Home / Self Care | Payer: Self-pay | Source: Ambulatory Visit | Attending: Internal Medicine

## 2014-04-17 ENCOUNTER — Ambulatory Visit (HOSPITAL_COMMUNITY)
Admission: RE | Admit: 2014-04-17 | Discharge: 2014-04-18 | Disposition: A | Discharge status: Home / Self Care | Payer: BC Managed Care – PPO | Source: Ambulatory Visit | Attending: Internal Medicine | Admitting: Internal Medicine

## 2014-04-17 ENCOUNTER — Encounter (HOSPITAL_COMMUNITY): Payer: Self-pay | Admitting: *Deleted

## 2014-04-17 DIAGNOSIS — I255 Ischemic cardiomyopathy: Secondary | ICD-10-CM | POA: Diagnosis present

## 2014-04-17 DIAGNOSIS — J438 Other emphysema: Secondary | ICD-10-CM | POA: Insufficient documentation

## 2014-04-17 DIAGNOSIS — I10 Essential (primary) hypertension: Secondary | ICD-10-CM | POA: Diagnosis present

## 2014-04-17 DIAGNOSIS — I2510 Atherosclerotic heart disease of native coronary artery without angina pectoris: Secondary | ICD-10-CM | POA: Insufficient documentation

## 2014-04-17 DIAGNOSIS — Z87891 Personal history of nicotine dependence: Secondary | ICD-10-CM | POA: Insufficient documentation

## 2014-04-17 DIAGNOSIS — I2589 Other forms of chronic ischemic heart disease: Secondary | ICD-10-CM | POA: Insufficient documentation

## 2014-04-17 DIAGNOSIS — Z6830 Body mass index (BMI) 30.0-30.9, adult: Secondary | ICD-10-CM | POA: Insufficient documentation

## 2014-04-17 DIAGNOSIS — I252 Old myocardial infarction: Secondary | ICD-10-CM | POA: Insufficient documentation

## 2014-04-17 DIAGNOSIS — I5022 Chronic systolic (congestive) heart failure: Secondary | ICD-10-CM | POA: Insufficient documentation

## 2014-04-17 DIAGNOSIS — Z7982 Long term (current) use of aspirin: Secondary | ICD-10-CM | POA: Insufficient documentation

## 2014-04-17 DIAGNOSIS — E785 Hyperlipidemia, unspecified: Secondary | ICD-10-CM | POA: Diagnosis present

## 2014-04-17 DIAGNOSIS — E669 Obesity, unspecified: Secondary | ICD-10-CM | POA: Insufficient documentation

## 2014-04-17 DIAGNOSIS — I509 Heart failure, unspecified: Secondary | ICD-10-CM | POA: Insufficient documentation

## 2014-04-17 HISTORY — PX: IMPLANTABLE CARDIOVERTER DEFIBRILLATOR IMPLANT: SHX5473

## 2014-04-17 LAB — SURGICAL PCR SCREEN
MRSA, PCR: NEGATIVE
Staphylococcus aureus: NEGATIVE

## 2014-04-17 SURGERY — IMPLANTABLE CARDIOVERTER DEFIBRILLATOR IMPLANT
Anesthesia: LOCAL

## 2014-04-17 MED ORDER — CEFAZOLIN SODIUM-DEXTROSE 2-3 GM-% IV SOLR
2.0000 g | Freq: Four times a day (QID) | INTRAVENOUS | Status: AC
  Administered 2014-04-17 – 2014-04-18 (×2): 2 g via INTRAVENOUS
  Filled 2014-04-17 (×2): qty 50

## 2014-04-17 MED ORDER — ASPIRIN EC 81 MG PO TBEC
81.0000 mg | DELAYED_RELEASE_TABLET | Freq: Every day | ORAL | Status: DC
  Filled 2014-04-17 (×2): qty 1

## 2014-04-17 MED ORDER — OXYCODONE-ACETAMINOPHEN 5-325 MG PO TABS
1.0000 | ORAL_TABLET | Freq: Four times a day (QID) | ORAL | Status: DC | PRN
  Administered 2014-04-17 – 2014-04-18 (×3): 2 via ORAL
  Filled 2014-04-17 (×3): qty 2

## 2014-04-17 MED ORDER — OXYCODONE HCL ER 10 MG PO T12A: 20.0000 mg | EXTENDED_RELEASE_TABLET | Freq: Two times a day (BID) | ORAL | Status: DC

## 2014-04-17 MED ORDER — FENTANYL CITRATE 0.05 MG/ML IJ SOLN
INTRAMUSCULAR | Status: AC
  Filled 2014-04-17: qty 2

## 2014-04-17 MED ORDER — LISINOPRIL 10 MG PO TABS
10.0000 mg | ORAL_TABLET | Freq: Every day | ORAL | Status: DC
  Filled 2014-04-17 (×2): qty 1

## 2014-04-17 MED ORDER — HYDROCODONE-ACETAMINOPHEN 5-325 MG PO TABS
1.0000 | ORAL_TABLET | ORAL | Status: DC | PRN
  Administered 2014-04-17: 15:00:00 2 via ORAL
  Filled 2014-04-17: qty 2

## 2014-04-17 MED ORDER — ALLOPURINOL 300 MG PO TABS
300.0000 mg | ORAL_TABLET | Freq: Every day | ORAL | Status: DC
  Filled 2014-04-17 (×2): qty 1

## 2014-04-17 MED ORDER — SIMVASTATIN 5 MG PO TABS: 5.0000 mg | ORAL_TABLET | Freq: Every day | ORAL | Status: DC

## 2014-04-17 MED ORDER — CEFAZOLIN SODIUM-DEXTROSE 2-3 GM-% IV SOLR
2.0000 g | Freq: Four times a day (QID) | INTRAVENOUS | Status: DC
Start: 2014-04-17 — End: 2014-04-17
  Administered 2014-04-17: 2 g via INTRAVENOUS
  Filled 2014-04-17 (×3): qty 50

## 2014-04-17 MED ORDER — TICAGRELOR 90 MG PO TABS
90.0000 mg | ORAL_TABLET | Freq: Two times a day (BID) | ORAL | Status: DC
  Administered 2014-04-17: 90 mg via ORAL
  Filled 2014-04-17 (×4): qty 1

## 2014-04-17 MED ORDER — PRAVASTATIN SODIUM 40 MG PO TABS
40.0000 mg | ORAL_TABLET | Freq: Every day | ORAL | Status: DC
  Administered 2014-04-17: 19:00:00 40 mg via ORAL
  Filled 2014-04-17 (×2): qty 1

## 2014-04-17 MED ORDER — TRIAMCINOLONE ACETONIDE 0.1 % EX CREA
1.0000 "application " | TOPICAL_CREAM | Freq: Every day | CUTANEOUS | Status: DC | PRN
  Filled 2014-04-17 (×2): qty 15

## 2014-04-17 MED ORDER — OXYCODONE HCL ER 10 MG PO T12A
10.0000 mg | EXTENDED_RELEASE_TABLET | Freq: Two times a day (BID) | ORAL | Status: DC
  Administered 2014-04-17: 21:00:00 10 mg via ORAL
  Filled 2014-04-17: qty 1

## 2014-04-17 MED ORDER — MIDAZOLAM HCL 5 MG/5ML IJ SOLN
INTRAMUSCULAR | Status: AC
  Filled 2014-04-17: qty 5

## 2014-04-17 MED ORDER — HEPARIN (PORCINE) IN NACL 2-0.9 UNIT/ML-% IJ SOLN
INTRAMUSCULAR | Status: AC
  Filled 2014-04-17: qty 500

## 2014-04-17 MED ORDER — MUPIROCIN 2 % EX OINT
TOPICAL_OINTMENT | CUTANEOUS | Status: AC
  Administered 2014-04-17: 1 via NASAL
  Filled 2014-04-17: qty 22

## 2014-04-17 MED ORDER — SPIRONOLACTONE 25 MG PO TABS
25.0000 mg | ORAL_TABLET | Freq: Every day | ORAL | Status: DC
  Administered 2014-04-17: 19:00:00 25 mg via ORAL
  Filled 2014-04-17 (×2): qty 1

## 2014-04-17 MED ORDER — LIDOCAINE HCL (PF) 1 % IJ SOLN
INTRAMUSCULAR | Status: AC
  Filled 2014-04-17: qty 60

## 2014-04-17 MED ORDER — NITROGLYCERIN 0.4 MG SL SUBL: 0.4000 mg | SUBLINGUAL_TABLET | SUBLINGUAL | Status: DC | PRN

## 2014-04-17 MED ORDER — COLCHICINE 0.6 MG PO TABS
0.6000 mg | ORAL_TABLET | Freq: Every day | ORAL | Status: DC | PRN
Start: 2014-04-17 — End: 2014-04-18
  Filled 2014-04-17: qty 1

## 2014-04-17 MED ORDER — MUPIROCIN 2 % EX OINT
TOPICAL_OINTMENT | Freq: Two times a day (BID) | CUTANEOUS | Status: DC
  Administered 2014-04-17: 1 via NASAL

## 2014-04-17 MED ORDER — YOU HAVE A PACEMAKER BOOK
Freq: Once | Status: AC
  Administered 2014-04-17: 23:00:00
  Filled 2014-04-17: qty 1

## 2014-04-17 MED ORDER — ONDANSETRON HCL 4 MG/2ML IJ SOLN: 4.0000 mg | Freq: Four times a day (QID) | INTRAMUSCULAR | Status: DC | PRN

## 2014-04-17 MED ORDER — FLUOCINONIDE 0.1 % EX CREA: 1.0000 "application " | TOPICAL_CREAM | Freq: Three times a day (TID) | CUTANEOUS | Status: DC | PRN

## 2014-04-17 MED ORDER — ACETAMINOPHEN 325 MG PO TABS
325.0000 mg | ORAL_TABLET | ORAL | Status: DC | PRN
  Administered 2014-04-17: 10:00:00 650 mg via ORAL
  Filled 2014-04-17: qty 2

## 2014-04-17 MED ORDER — CARVEDILOL 6.25 MG PO TABS
9.3750 mg | ORAL_TABLET | Freq: Two times a day (BID) | ORAL | Status: DC
  Administered 2014-04-17: 9.375 mg via ORAL
  Filled 2014-04-17 (×4): qty 1

## 2014-04-17 MED ORDER — ZOLPIDEM TARTRATE 5 MG PO TABS
5.0000 mg | ORAL_TABLET | Freq: Every evening | ORAL | Status: DC | PRN
  Administered 2014-04-17: 21:00:00 5 mg via ORAL
  Filled 2014-04-17: qty 1

## 2014-04-17 NOTE — Interval H&P Note (Signed)
History and Physical Interval Note:  04/17/2014 7:10 AM  Kerry Mason  has presented today for surgery, with the diagnosis of chf/LV disfunction/acm  The various methods of treatment have been discussed with the patient and family. After consideration of risks, benefits and other options for treatment, the patient has consented to  Procedure(s): IMPLANTABLE CARDIOVERTER DEFIBRILLATOR IMPLANT (N/A) as a surgical intervention .  The patient's history has been reviewed, patient examined, no change in status, stable for surgery.  I have reviewed the patient's chart and labs.  Questions were answered to the patient's satisfaction.     Norlene Duel.D.

## 2014-04-17 NOTE — CV Procedure (Addendum)
SURGEON:  Cristopher Peru, MD      PREPROCEDURE DIAGNOSES:   1. Ischemic cardiomyopathy.   2. New York Heart Association class II, heart failure chronically, EF 30%     POSTPROCEDURE DIAGNOSES:   1. Ischemic cardiomyopathy.   2. New York Heart Association class III heart failure chronically., EF 30%     PROCEDURES:    1. ICD implantation.  3. Defibrillation threshold testing     INTRODUCTION:  Kerry Mason is a 58 y.o. male with an ischemic CM (EF 30), NYHA Class III CHF, and CAD. At this time, he meets MADIT II/ SCD-HeFT criteria for ICD implantation for primary prevention of sudden death.  The patient has a narrow QRS and does not meet criteria for revascularization.  The patient has been treated with an optimal medical regimen but continues to have a depressed ejection fraction and NYHA Class III CHF symptoms.  The patient therefore  presents today for ICD implantation.      DESCRIPTION OF PROCEDURE:  Informed written consent was obtained and the patient was brought to the electrophysiology lab in the fasting state. The patient was adequately sedated with intravenous Versed, and fentanyl as outlined in the nursing report.  The patient's right chest was prepped and draped in the usual sterile fashion by the EP lab staff.  The skin overlying the left deltopectoral region was infiltrated with lidocaine for local analgesia.  A 5-cm incision was made over the right deltopectoral region.  A right subcutaneous defibrillator pocket was fashioned using a combination of sharp and blunt dissection.  Electrocautery was used to assure hemostasis.      RA/RV Lead Placement: The right axillary vein was cannulated with fluoroscopic visualization.  No contrast was required for this endeavor.  Through the right axillary vein,  a Biotronik (serial number [id number]) right ventricular defibrillator lead (VDD) was advanced with fluoroscopic visualization into the right ventricular apical septal position.   Initial atrial lead P-waves measured 6.5 mV.  The right ventricular lead R-wave measured 18 mV with impedance of 508 ohms and a threshold of 0.6 volts at 0.5 milliseconds.   The leads were secured to the pectoralis  fascia using #2 silk suture over the suture sleeves.  The pocket then  irrigated with copious gentamicin solution.  The leads were then  connected to a Biotronik VDD (serial  Number [id number]) ICD.  The defibrillator was placed into the  pocket.  The pocket was then closed in 2 layers with 2.0 Vicryl suture  for the subcutaneous and subcuticular layers.  Steri-Strips and a  sterile dressing were then applied.   DFT Testing: Defibrillation Threshold testing was then performed. Ventricular fibrillation was induced with a T shock.  Adequate sensing of ventricular fibrillation was observed with minimal dropout with a programmed sensitivity of 1.1mV.  The patient was successfully defibrillated to sinus rhythm with a single 20 joules shock delivered from the device with an impedance of 75 ohms in a duration of 4.4 seconds.  The patient remained in sinus rhythm thereafter.  There were no early apparent complications.     CONCLUSIONS:   1. Ischemic cardiomyopathy with chronic New York Heart Association class III heart failure EF 30%.   2. Successful ICD implantation.   3. DFT less than or equal to 20 joules.   4. No early apparent complications.   Cristopher Peru, MD  8:34 AM 04/17/2014

## 2014-04-17 NOTE — Telephone Encounter (Signed)
Received request from Nurse fax box, documents faxed for surgical clearance. To: [facility] Fax number: [phone] Attention: 5.15.15/km

## 2014-04-17 NOTE — H&P (Signed)
  ICD Criteria  Current LVEF:30% ;Obtained > or = 1 month ago and < or = 3 months ago.  NYHA Functional Classification: Class II  Heart Failure History:  Yes, Duration of heart failure since onset is > 9 months  Non-Ischemic Dilated Cardiomyopathy History:  No.  Atrial Fibrillation/Atrial Flutter:  No.  Ventricular Tachycardia History:  No.  Cardiac Arrest History:  No  History of Syndromes with Risk of Sudden Death:  No.  Previous ICD:  No.  Electrophysiology Study: No.  Prior MI: Yes, Most recent MI timeframe is > 40 days.  PPM: No.  OSA:  No  Patient Life Expectancy of >=1 year: Yes.  Anticoagulation Therapy:  Patient is NOT on anticoagulation therapy.   Beta Blocker Therapy:  Yes.   Ace Inhibitor/ARB Therapy:  Yes.

## 2014-04-17 NOTE — Progress Notes (Signed)
Patient had a 16 beat run of SVT at 1723 with reported symptoms of "feels like knocking on the door" and beats fingers on the table.Asked "like a flutter in the chest?". Answered, "Yes, and I feel nervous all over." Stated he has had these symptoms at home. Ectopy prior to this has been unifocal PVC's and unifocal pairs of PVCs.

## 2014-04-17 NOTE — H&P (View-Only) (Signed)
HPI [name] is referred today for consideration for ICD implantation. He is a pleasant middle aged man with known CAD, s/p MI, with an EF 30% despite maximal medical therapy after revascularization 4 months ago. He has worn a life vest with no shocks. He has class 2. CHF symptoms.  No Known Allergies   Current Outpatient Prescriptions  Medication Sig Dispense Refill  . allopurinol (ZYLOPRIM) 300 MG tablet Take 1 tablet (300 mg total) by mouth daily.  30 tablet  6  . aspirin EC 81 MG tablet Take 81 mg by mouth daily.      . carvedilol (COREG) 6.25 MG tablet Take 1 1/2 tablets  twice a day      . CREAM BASE EX Apply 1 application topically daily as needed (for itching). Topical cream daily as needed      . lisinopril (PRINIVIL,ZESTRIL) 10 MG tablet Take 1 tablet (10 mg total) by mouth daily.  90 tablet  3  . nitroGLYCERIN (NITROSTAT) 0.4 MG SL tablet Place 1 tablet (0.4 mg total) under the tongue every 5 (five) minutes as needed for chest pain.  25 tablet  1  . pravastatin (PRAVACHOL) 40 MG tablet Take 1 tablet (40 mg total) by mouth daily.  30 tablet  0  . spironolactone (ALDACTONE) 25 MG tablet Take 1 tablet (25 mg total) by mouth daily.  90 tablet  3  . Ticagrelor (BRILINTA) 90 MG TABS tablet Take 1 tablet (90 mg total) by mouth 2 (two) times daily.  60 tablet  0   No current facility-administered medications for this visit.     Past Medical History  Diagnosis Date  . CAD (coronary artery disease)     a. 2000: s/p stent of RCA 2000 with BMS  . Tobacco abuse   . Obesity   . ST elevation myocardial infarction (STEMI) of anterior wall, subsequent episode of care   . HTN (hypertension)   . Hyperlipidemia   . Acute systolic CHF (congestive heart failure)   . Ischemic cardiomyopathy     ROS:   All systems reviewed and negative except as noted in the HPI.   Past Surgical History  Procedure Laterality Date  . Cardiac catheterization    . Coronary angioplasty  2000    . Coronary angioplasty with stent placement  2015     Family History  Problem Relation Age of Onset  . CAD Father     PTCA     History   Social History  . Marital Status: Married    Spouse Name: N/A    Number of Children: 2  . 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 Smoking status: Former Smoker -- 1.00 packs/day for 35 years    Types: Cigarettes    Quit date: 12/11/2013  . Smokeless tobacco: Not on file  . Alcohol Use: No  . Drug Use: No  . Sexual Activity: Not Currently   Other Topics Concern  . Not on file   Social History Narrative  . No narrative on file     BP 128/82  Pulse 71  Ht 6\' 1"  (1.854 m)  Wt 230 lb (104.327 kg)  BMI 30.35 kg/m2  Physical Exam:  Well appearing middle aged man, NAD HEENT: Unremarkable Neck:  No JVD, no thyromegally Back:  No CVA tenderness Lungs:  Clear with no wheezes HEART:  Regular rate rhythm, no murmurs,  no rubs, no clicks Abd:  soft, positive bowel sounds, no organomegally, no rebound, no guarding Ext:  2 plus pulses, no edema, no cyanosis, no clubbing Skin:  No rashes no nodules Neuro:  CN II through XII intact, motor grossly intact  EKG - reviewed, NSR   Assess/Plan:

## 2014-04-17 NOTE — Progress Notes (Signed)
Called by RN re: Pt having problems with pain, from ICD site. No reported ecchymosis, hematoma. Will add Vicodin and follow.

## 2014-04-18 ENCOUNTER — Ambulatory Visit (HOSPITAL_COMMUNITY): Payer: BC Managed Care – PPO

## 2014-04-18 ENCOUNTER — Telehealth: Payer: Self-pay | Admitting: Physician Assistant

## 2014-04-18 DIAGNOSIS — I5022 Chronic systolic (congestive) heart failure: Secondary | ICD-10-CM

## 2014-04-18 DIAGNOSIS — I509 Heart failure, unspecified: Secondary | ICD-10-CM

## 2014-04-18 MED ORDER — TICAGRELOR 90 MG PO TABS: 90.0000 mg | ORAL_TABLET | Freq: Two times a day (BID) | ORAL | Status: DC

## 2014-04-18 MED ORDER — IBUPROFEN 200 MG PO TABS: 200.0000 mg | ORAL_TABLET | Freq: Four times a day (QID) | ORAL | Status: DC | PRN

## 2014-04-18 MED ORDER — ACETAMINOPHEN 325 MG PO TABS: 325.0000 mg | ORAL_TABLET | ORAL | Status: DC | PRN

## 2014-04-18 NOTE — Telephone Encounter (Signed)
Error

## 2014-04-18 NOTE — Progress Notes (Signed)
Subjective: Patient reports his heart felt like it was going to jump out of his chest.  He's also been in some pain  Objective: Vital signs in last 24 hours: Temp:  [97.4 F (36.3 C)-97.9 F (36.6 C)] 97.5 F (36.4 C) (05/16 0447) Pulse Rate:  [53-60] 59 (05/16 0447) Resp:  [17-18] 18 (05/16 0447) BP: (90-135)/(57-89) 135/89 mmHg (05/16 0447) SpO2:  [97 %-99 %] 99 % (05/16 0447) Weight:  [225 lb 12 oz (102.4 kg)] 225 lb 12 oz (102.4 kg) (05/16 0019)    Intake/Output from previous day: 05/15 0701 - 05/16 0700 In: 720 [P.O.:720] Out: 1500 [Urine:1500] Intake/Output this shift:    Medications Current Facility-Administered Medications  Medication Dose Route Frequency Provider Last Rate Last Dose  . acetaminophen (TYLENOL) tablet 325-650 mg  325-650 mg Oral Q4H PRN Evans Lance, MD   650 mg at 04/17/14 1026  . allopurinol (ZYLOPRIM) tablet 300 mg  300 mg Oral Daily Evans Lance, MD      . aspirin EC tablet 81 mg  81 mg Oral Daily Evans Lance, MD      . carvedilol (COREG) tablet 9.375 mg  9.375 mg Oral BID WC Evans Lance, MD   9.375 mg at 04/17/14 1851  . colchicine tablet 0.6 mg  0.6 mg Oral Daily PRN Evans Lance, MD      . lisinopril (PRINIVIL,ZESTRIL) tablet 10 mg  10 mg Oral Daily Evans Lance, MD      . nitroGLYCERIN (NITROSTAT) SL tablet 0.4 mg  0.4 mg Sublingual Q5 min PRN Evans Lance, MD      . ondansetron [facility]) injection 4 mg  4 mg Intravenous Q6H PRN Evans Lance, MD      . OxyCODONE (OXYCONTIN) 12 hr tablet 10 mg  10 mg Oral Q12H Cletus Gash, MD   10 mg at 04/17/14 2120  . oxyCODONE-acetaminophen (PERCOCET/ROXICET) 5-325 MG per tablet 1-2 tablet  1-2 tablet Oral Q6H PRN Lyda Jester, PA-C   2 tablet at 04/18/14 0704  . pravastatin (PRAVACHOL) tablet 40 mg  40 mg Oral q1800 Arty Baumgartner, [place]   40 mg at 04/17/14 1851  . spironolactone (ALDACTONE) tablet 25 mg  25 mg Oral QHS Evans Lance, MD   25 mg at 04/17/14 1853  .  ticagrelor (BRILINTA) tablet 90 mg  90 mg Oral BID Evans Lance, MD   90 mg at 04/17/14 1851  . triamcinolone cream (KENALOG) 0.1 % 1 application  1 application Topical 5 X Daily PRN Dayna N Dunn, PA-C      . zolpidem (AMBIEN) tablet 5 mg  5 mg Oral QHS PRN,MR X 1 Evans Lance, MD   5 mg at 04/17/14 2120    PE: General appearance: alert, cooperative and no distress Lungs: clear to auscultation bilaterally Heart: regular rate and rhythm, S1, S2 normal, no murmur, click, rub or gallop Extremities: No LEE Pulses: 2+ and symmetric Skin: small amount of drainage on the dressing. Neurologic: Grossly normal    Assessment/Plan  Active Problems:   Chronic systolic heart failure   ICD implant  Plan:  SP Biotronik VDD (serial Number [id number]) ICD placement.  No apparent pneumothorax on CXR.Marland Kitchen Official read to follow.    BP stable.  The patient had a 16 beat of sinus tach.  Coincides with heart thumping symptom.   Consider increasing coreg, however, BP has been on the lower side.  DC home today.  LOS: 1 day    Tarri Fuller PA-C 04/18/2014 7:21 AM  EP Attending  Patient seen and examined. Agree with above. I would like him to hold his Brilinta for 2 days. Ok for [employer] for 2 days while off of Brilinta.  Mikle Bosworth.D.

## 2014-04-18 NOTE — Discharge Instructions (Signed)
° ° °  Supplemental Discharge Instructions for  Pacemaker/Defibrillator Patients  Activity No heavy lifting or vigorous activity with your left/right arm for 6 to 8 weeks.  Do not raise your left/right arm above your head for one week.  Gradually raise your affected arm as drawn below.           __  NO DRIVING for a week    ; you may begin driving on Apr 24, 9562.  WOUND CARE   Keep the wound area clean and dry.  Do not get this area wet for one week.  You may shower after two days, BUT NO DIRECT WATER SPRAY ON THE WOUND AND PAT DRY IMMEDIATELY. The tape/steri-strips on your wound will fall off; do not pull them off.  No bandage is needed on the site.  DO  NOT apply any creams, oils, or ointments to the wound area.   If you notice any drainage or discharge from the wound, any swelling or bruising at the site, or you develop a fever > 101? F after you are discharged home, call the office at once.  Special Instructions   You are still able to use cellular telephones; use the ear opposite the side where you have your pacemaker/defibrillator.  Avoid carrying your cellular phone near your device.   When traveling through airports, show security personnel your identification card to avoid being screened in the metal detectors.  Ask the security personnel to use the hand wand.   Avoid arc welding equipment, MRI testing (magnetic resonance imaging), TENS units (transcutaneous nerve stimulators).  Call the office for questions about other devices.   Avoid electrical appliances that are in poor condition or are not properly grounded.   Microwave ovens are safe to be near or to operate.  Additional information for defibrillator patients should your device go off:   If your device goes off ONCE and you feel fine afterward, notify the device clinic nurses.   If your device goes off ONCE and you do not feel well afterward, call 911.   If your device goes off TWICE, call 911.   If your device goes off  THREE times in one day, call 911.  DO NOT DRIVE YOURSELF OR A FAMILY MEMBER WITH A DEFIBRILLATOR TO THE HOSPITAL--CALL 911.    Restart Brilinta on Apr 20, 2014

## 2014-04-18 NOTE — Discharge Summary (Signed)
Physician Discharge Summary     Patient ID: Kerry Mason MRN: [id number] DOB/AGE: 1956/02/23 58 y.o.  Cardiologist:  [place]  Admit date: 04/17/2014 Discharge date: 04/18/2014  Admission Diagnoses:  Discharge Diagnoses:  Active Problems:   Cardiomyopathy, ischemic   HTN (hypertension)   Hyperlipidemia   Chronic systolic heart failure   I  Discharged Condition: stable  Hospital Course:  Mr. Gueye is referred for consideration for ICD implantation. He is a pleasant middle aged man with known CAD, s/p MI, with an EF 30% despite maximal medical therapy after revascularization 4 months ago. He has worn a life vest with no shocks. He has class 2. CHF symptoms.   He presented for ICD implant which was completed with a Biotronik VDD (serial Number [id number]) ICD.  There were no apparent complications.  CXR was negative for pneumothorax.  Vicodin was used to help control pain.  He had a 16 beat run of atrial tach which the patient reported feeling the thumping.  Holding Brilinta for two two days. The patient was seen by Dr. [name] who felt he was stable for DC home.   Consults: None  Significant Diagnostic Studies:   SURGEON: Cristopher Peru, MD  PREPROCEDURE DIAGNOSES:  1. Ischemic cardiomyopathy.  2. New York Heart Association class II, heart failure chronically, EF 30%  POSTPROCEDURE DIAGNOSES:  1. Ischemic cardiomyopathy.  2. New York Heart Association class III heart failure chronically., EF 30%  PROCEDURES:  1. ICD implantation.  3. Defibrillation threshold testing  INTRODUCTION: Kerry Mason is a 58 y.o. male with an ischemic CM (EF 30), NYHA Class III CHF, and CAD. At this time, he meets MADIT II/ SCD-HeFT criteria for ICD implantation for primary prevention of sudden death. The patient has a narrow QRS and does not meet criteria for revascularization. The patient has been treated with an optimal medical regimen but continues to have a depressed ejection fraction and NYHA  Class III CHF symptoms. The patient therefore presents today for ICD implantation.  DESCRIPTION OF PROCEDURE: Informed written consent was obtained and the patient was brought to the electrophysiology lab in the fasting state. The patient was adequately sedated with intravenous Versed, and fentanyl as outlined in the nursing report. The patient's right chest was prepped and draped in the usual sterile fashion by the EP lab staff. The skin overlying the left deltopectoral region was infiltrated with lidocaine for local analgesia. A 5-cm incision was made over the right deltopectoral region. A right subcutaneous defibrillator pocket was fashioned using a combination of sharp and blunt dissection. Electrocautery was used to assure hemostasis.  RA/RV Lead Placement:  The right axillary vein was cannulated with fluoroscopic visualization. No contrast was required for this endeavor. Through the right axillary vein, a Biotronik (serial number [id number]) right ventricular defibrillator lead (VDD) was advanced with fluoroscopic visualization into the right ventricular apical septal position. Initial atrial lead P-waves measured 6.5 mV. The right ventricular lead R-wave measured 18 mV with impedance of 508 ohms and a threshold of 0.6 volts at 0.5 milliseconds.  The leads were secured to the pectoralis fascia using #2 silk suture over the suture sleeves. The pocket then irrigated with copious gentamicin solution. The leads were then connected to a Biotronik VDD (serial Number [id number]) ICD. The defibrillator was placed into the pocket. The pocket was then closed in 2 layers with 2.0 Vicryl suture for the subcutaneous and subcuticular layers. Steri-Strips and a sterile dressing were then applied.  DFT Testing:  Defibrillation Threshold testing  was then performed. Ventricular fibrillation was induced with a T shock. Adequate sensing of ventricular fibrillation was observed with minimal dropout with a programmed  sensitivity of 1.78mV. The patient was successfully defibrillated to sinus rhythm with a single 20 joules shock delivered from the device with an impedance of 75 ohms in a duration of 4.4 seconds. The patient remained in sinus rhythm thereafter. There were no early apparent complications.  CONCLUSIONS:  1. Ischemic cardiomyopathy with chronic New York Heart Association class III heart failure EF 30%.  2. Successful ICD implantation.  3. DFT less than or equal to 20 joules.  4. No early apparent complications.  Cristopher Peru, MD  8:34 AM   CHEST 2 VIEW  COMPARISON: December 11, 2013  FINDINGS: Pacemaker lead tip is attached to the middle cardiac vein. No pneumothorax. There is underlying emphysema. There is no edema or consolidation. Heart is upper normal in size with normal pulmonary vascularity. There are apparent stents in the left anterior descending and right coronary arteries. No adenopathy.  IMPRESSION: Pacemaker lead attached to the right middle cardiac vein. Coronary artery stents are present. Heart mildly prominent but stable. No edema or consolidation. Underlying emphysema. No apparent pneumothorax.   Treatments: See above  Discharge Exam: Blood pressure 128/88, pulse 59, temperature 98 F (36.7 C), temperature source Oral, resp. rate 18, height 6\' 1"  (1.854 m), weight 225 lb 12 oz (102.4 kg), SpO2 99.00%.  Disposition: 01-Home or Self Care     Medication List         acetaminophen 325 MG tablet  Commonly known as:  TYLENOL  Take 1-2 tablets (325-650 mg total) by mouth every 4 (four) hours as needed for mild pain.     allopurinol 300 MG tablet  Commonly known as:  ZYLOPRIM  Take 1 tablet (300 mg total) by mouth daily.     aspirin EC 81 MG tablet  Take 81 mg by mouth daily.     carvedilol 6.25 MG tablet  Commonly known as:  COREG  Take 9.375 mg by mouth 2 (two) times daily with a meal. Take 1 1/2 tablets  twice a day     COLCRYS 0.6 MG tablet  Generic  drug:  colchicine  Take 0.6 mg by mouth daily as needed (gout flare ups).     Fluocinonide 0.1 % Crea  Apply 1 application topically 3 (three) times daily as needed (rash - on legs, hands and bottom).     ibuprofen 200 MG tablet  Commonly known as:  ADVIL  Take 1 tablet (200 mg total) by mouth every 6 (six) hours as needed for mild pain (Only take for the next two days.  After that use tylenol).     lisinopril 10 MG tablet  Commonly known as:  PRINIVIL,ZESTRIL  Take 1 tablet (10 mg total) by mouth daily.     nitroGLYCERIN 0.4 MG SL tablet  Commonly known as:  NITROSTAT  Place 1 tablet (0.4 mg total) under the tongue every 5 (five) minutes as needed for chest pain.     pravastatin 40 MG tablet  Commonly known as:  PRAVACHOL  Take 40 mg by mouth at bedtime.     spironolactone 25 MG tablet  Commonly known as:  ALDACTONE  Take 25 mg by mouth at bedtime.     ticagrelor 90 MG Tabs tablet  Commonly known as:  BRILINTA  Take 1 tablet (90 mg total) by mouth 2 (two) times daily.     triamcinolone cream 0.1 %  Commonly known as:  KENALOG  Apply 1 application topically 5 (five) times daily as needed (rash on legs).       Follow-up Information   Follow up with [facility]. (This will be a wound check in the Device clinic.  They will call you on monday with the appt date and time.)    Specialty:  Electrophysiology   Contact information:   [address] [place] [address] [phone]      Signed: Tarri Fuller 04/18/2014, 8:35 AM

## 2014-04-20 ENCOUNTER — Encounter (HOSPITAL_COMMUNITY): Payer: BC Managed Care – PPO

## 2014-04-22 ENCOUNTER — Telehealth: Payer: Self-pay | Admitting: Internal Medicine

## 2014-04-22 ENCOUNTER — Encounter (HOSPITAL_COMMUNITY): Payer: BC Managed Care – PPO

## 2014-04-22 NOTE — Telephone Encounter (Signed)
Spoke with patient and assured him that his ICD did not discharge.  Follow up as scheduled.

## 2014-04-22 NOTE — Telephone Encounter (Signed)
New message     Pt think his defibulator shocked him last evening.  He has had it 1wk. Please advise

## 2014-04-24 ENCOUNTER — Encounter (HOSPITAL_COMMUNITY): Payer: BC Managed Care – PPO

## 2014-04-28 ENCOUNTER — Ambulatory Visit (INDEPENDENT_AMBULATORY_CARE_PROVIDER_SITE_OTHER): Payer: BC Managed Care – PPO | Admitting: *Deleted

## 2014-04-28 ENCOUNTER — Encounter: Payer: Self-pay | Admitting: Internal Medicine

## 2014-04-28 ENCOUNTER — Encounter: Payer: Self-pay | Admitting: *Deleted

## 2014-04-28 DIAGNOSIS — I2589 Other forms of chronic ischemic heart disease: Secondary | ICD-10-CM

## 2014-04-28 DIAGNOSIS — I255 Ischemic cardiomyopathy: Secondary | ICD-10-CM

## 2014-04-28 LAB — MDC_IDC_ENUM_SESS_TYPE_INCLINIC
Battery Voltage: 3.14 V
HIGH POWER IMPEDANCE MEASURED VALUE: 65 Ohm
Implantable Pulse Generator Serial Number: 60800912
Lead Channel Pacing Threshold Amplitude: 0.5 V
Lead Channel Pacing Threshold Pulse Width: 0.4 ms
Lead Channel Sensing Intrinsic Amplitude: 16.3 mV
Lead Channel Setting Pacing Amplitude: 3 V
Lead Channel Setting Pacing Pulse Width: 0.4 ms
Lead Channel Setting Sensing Sensitivity: 0.8 mV
MDC IDC MSMT LEADCHNL RA SENSING INTR AMPL: 16.9 mV
MDC IDC MSMT LEADCHNL RV IMPEDANCE VALUE: 434 Ohm
MDC IDC SESS DTM: 20150526092126
MDC IDC STAT BRADY RV PERCENT PACED: 0 %
Zone Setting Detection Interval: 250 ms
Zone Setting Detection Interval: 310 ms

## 2014-04-28 NOTE — Progress Notes (Signed)
Wound check appointment. Steri-strips removed. Wound without redness or edema. Incision edges approximated, wound well healed. Normal device function. Thresholds, sensing, and impedances consistent with implant measurements. Device programmed at 3.5V for extra safety margin until 3 month visit. Histogram distribution appropriate for patient and level of activity. No ventricular arrhythmias noted. Patient educated about wound care, arm mobility, lifting restrictions, shock plan. ROV in 3 months with implanting physician.  Checked by [employer].

## 2014-04-29 ENCOUNTER — Encounter (HOSPITAL_COMMUNITY): Payer: BC Managed Care – PPO

## 2014-05-01 ENCOUNTER — Encounter (HOSPITAL_COMMUNITY): Payer: BC Managed Care – PPO

## 2014-05-04 ENCOUNTER — Encounter (HOSPITAL_COMMUNITY): Admission: RE | Admit: 2014-05-04 | Payer: BC Managed Care – PPO | Source: Ambulatory Visit

## 2014-05-05 ENCOUNTER — Telehealth (HOSPITAL_COMMUNITY): Payer: Self-pay | Admitting: *Deleted

## 2014-05-05 NOTE — Telephone Encounter (Signed)
Message copied by Magda Kiel on Tue May 05, 2014 11:24 AM ------      Message from: Dionicio Stall      Created: Tue May 05, 2014 11:03 AM      Regarding: RE: return to exercise at cardiac rehab       Discussed with Dr [name] and he may return today            Claiborne Billings      ----- Message -----         From: Magda Kiel, RN         Sent: 05/05/2014   9:39 AM           To: Dionicio Stall, RN      Subject: return to exercise at cardiac rehab                      Good morning Claiborne Billings,            Would you be able to find out from Dr [name] if Mr Ovens can return to exercise at cardiac rehab?                  He is anxious to return             I appreciate your assistance,            Thanks            Verdis Frederickson       ------

## 2014-05-06 ENCOUNTER — Encounter (HOSPITAL_COMMUNITY)
Admission: RE | Admit: 2014-05-06 | Discharge: 2014-05-06 | Disposition: A | Discharge status: Home / Self Care | Payer: BC Managed Care – PPO | Source: Ambulatory Visit | Attending: Cardiology | Admitting: Cardiology

## 2014-05-06 DIAGNOSIS — I219 Acute myocardial infarction, unspecified: Secondary | ICD-10-CM | POA: Insufficient documentation

## 2014-05-06 DIAGNOSIS — Z5189 Encounter for other specified aftercare: Secondary | ICD-10-CM | POA: Insufficient documentation

## 2014-05-06 DIAGNOSIS — Z9861 Coronary angioplasty status: Secondary | ICD-10-CM | POA: Insufficient documentation

## 2014-05-06 NOTE — Progress Notes (Signed)
Kerry Mason returned to exercise today after his ICD implantation. Incision CDI.Will continue to monitor the patient throughout  the program.

## 2014-05-08 ENCOUNTER — Encounter (HOSPITAL_COMMUNITY)
Admission: RE | Admit: 2014-05-08 | Discharge: 2014-05-08 | Disposition: A | Discharge status: Home / Self Care | Payer: BC Managed Care – PPO | Source: Ambulatory Visit | Attending: Cardiology | Admitting: Cardiology

## 2014-05-11 ENCOUNTER — Encounter (HOSPITAL_COMMUNITY): Payer: BC Managed Care – PPO

## 2014-05-13 ENCOUNTER — Encounter (HOSPITAL_COMMUNITY)
Admission: RE | Admit: 2014-05-13 | Discharge: 2014-05-13 | Disposition: A | Discharge status: Home / Self Care | Payer: BC Managed Care – PPO | Source: Ambulatory Visit | Attending: Cardiology | Admitting: Cardiology

## 2014-05-15 ENCOUNTER — Encounter (HOSPITAL_COMMUNITY): Payer: BC Managed Care – PPO

## 2014-05-18 ENCOUNTER — Encounter (HOSPITAL_COMMUNITY): Payer: BC Managed Care – PPO

## 2014-05-20 ENCOUNTER — Encounter (HOSPITAL_COMMUNITY): Payer: BC Managed Care – PPO

## 2014-05-21 ENCOUNTER — Telehealth (HOSPITAL_COMMUNITY): Payer: Self-pay | Admitting: *Deleted

## 2014-05-22 ENCOUNTER — Encounter (HOSPITAL_COMMUNITY): Payer: BC Managed Care – PPO

## 2014-05-25 ENCOUNTER — Encounter (HOSPITAL_COMMUNITY)
Admission: RE | Admit: 2014-05-25 | Discharge: 2014-05-25 | Disposition: A | Discharge status: Home / Self Care | Payer: BC Managed Care – PPO | Source: Ambulatory Visit | Attending: Cardiology | Admitting: Cardiology

## 2014-05-25 NOTE — Progress Notes (Signed)
Kerry Mason 58 y.o. male Nutrition Note Spoke with pt.  Nutrition Plan and Nutrition Survey goals reviewed with pt. Pt is following Step 2 of the Therapeutic Lifestyle Changes diet. Pt states he is working toward eating more of a plant based diet. Pt wants to lose wt. Pt has been trying to lose wt by "decreasing portion sizes and eating healthier foods." Pt states his highest wt less than 1 year ago was 280 lb. Wt gain due in part to fluid gain with CHF. Pt wt today reportedly 103 kg (226 lb). Pt wt is down 25 lb over the past 6 months. Rate of wt loss appears safe. Wt loss tips reviewed. Pt is watching sodium intake closely. This Probation officer explained the reasoning behind recommended sodium restriction. Vitals - 1 value per visit 04/18/2014 04/17/2014 04/07/2014 03/26/2014 03/17/2014  Weight (lb) 225.75  230 233.03 234   Vitals - 1 value per visit 01/26/2014 01/01/2014 12/18/2013 12/14/2013  Weight (lb) 239 252.12 247.4 251.32    Nutrition Diagnosis   Food-and nutrition-related knowledge deficit related to lack of exposure to information as related to diagnosis of: ? CVD ? Pre-DM (A1c 5.8) ?   Obesity related to excessive energy intake as evidenced by a BMI of 30.7  Nutrition RX/ Estimated Daily Nutrition Needs for: wt loss  1800-2300 Kcal, 50-60 gm fat, 11-15 gm sat fat, 1.8-2.3 gm trans-fat, <1500 mg sodium  Nutrition Intervention   Pt's individual nutrition plan reviewed with pt.   Benefits of adopting Therapeutic Lifestyle Changes discussed when Medficts reviewed.   Pt to attend the Portion Distortion class   Pt to attend the  ? Nutrition I class                     ? Nutrition II class   Pt given handouts for: ? 1800 kcal, 5 day menu ideas   Continue client-centered nutrition education by RD, as part of interdisciplinary care. Goal(s)   Pt to identify food quantities necessary to achieve: ? wt loss to a goal wt of 208-226 lb (94.8-103 kg) at graduation from cardiac rehab.    Pt to describe the  benefit of including fruits, vegetables, whole grains, and low-fat dairy products in a heart healthy meal plan. Monitor and Evaluate progress toward nutrition goal with team. Nutrition Risk: Change to Moderate Derek Mound, M.Ed, RD, LDN, CDE 05/25/2014 3:46 PM

## 2014-05-27 ENCOUNTER — Encounter (HOSPITAL_COMMUNITY): Payer: BC Managed Care – PPO

## 2014-05-29 ENCOUNTER — Encounter (HOSPITAL_COMMUNITY): Payer: BC Managed Care – PPO

## 2014-06-01 ENCOUNTER — Encounter (HOSPITAL_COMMUNITY): Payer: BC Managed Care – PPO

## 2014-06-03 ENCOUNTER — Encounter (HOSPITAL_COMMUNITY): Payer: BC Managed Care – PPO

## 2014-06-08 ENCOUNTER — Encounter (HOSPITAL_COMMUNITY): Admission: RE | Admit: 2014-06-08 | Payer: BC Managed Care – PPO | Source: Ambulatory Visit

## 2014-06-10 ENCOUNTER — Telehealth: Payer: Self-pay | Admitting: Cardiology

## 2014-06-10 ENCOUNTER — Encounter (HOSPITAL_COMMUNITY): Payer: BC Managed Care – PPO

## 2014-06-10 ENCOUNTER — Emergency Department (HOSPITAL_COMMUNITY): Payer: BC Managed Care – PPO

## 2014-06-10 ENCOUNTER — Emergency Department (HOSPITAL_COMMUNITY)
Admission: EM | Admit: 2014-06-10 | Discharge: 2014-06-10 | Disposition: A | Discharge status: Home / Self Care | Payer: BC Managed Care – PPO | Attending: Emergency Medicine | Admitting: Emergency Medicine

## 2014-06-10 ENCOUNTER — Encounter (HOSPITAL_COMMUNITY): Payer: Self-pay | Admitting: Emergency Medicine

## 2014-06-10 DIAGNOSIS — I252 Old myocardial infarction: Secondary | ICD-10-CM | POA: Insufficient documentation

## 2014-06-10 DIAGNOSIS — I2589 Other forms of chronic ischemic heart disease: Secondary | ICD-10-CM | POA: Insufficient documentation

## 2014-06-10 DIAGNOSIS — R110 Nausea: Secondary | ICD-10-CM | POA: Insufficient documentation

## 2014-06-10 DIAGNOSIS — R6883 Chills (without fever): Secondary | ICD-10-CM | POA: Insufficient documentation

## 2014-06-10 DIAGNOSIS — R61 Generalized hyperhidrosis: Secondary | ICD-10-CM | POA: Insufficient documentation

## 2014-06-10 DIAGNOSIS — I2510 Atherosclerotic heart disease of native coronary artery without angina pectoris: Secondary | ICD-10-CM | POA: Insufficient documentation

## 2014-06-10 DIAGNOSIS — E669 Obesity, unspecified: Secondary | ICD-10-CM | POA: Insufficient documentation

## 2014-06-10 DIAGNOSIS — Z9889 Other specified postprocedural states: Secondary | ICD-10-CM | POA: Insufficient documentation

## 2014-06-10 DIAGNOSIS — Z87891 Personal history of nicotine dependence: Secondary | ICD-10-CM | POA: Insufficient documentation

## 2014-06-10 DIAGNOSIS — R079 Chest pain, unspecified: Secondary | ICD-10-CM | POA: Insufficient documentation

## 2014-06-10 DIAGNOSIS — Z79899 Other long term (current) drug therapy: Secondary | ICD-10-CM | POA: Insufficient documentation

## 2014-06-10 DIAGNOSIS — I10 Essential (primary) hypertension: Secondary | ICD-10-CM | POA: Insufficient documentation

## 2014-06-10 DIAGNOSIS — I5022 Chronic systolic (congestive) heart failure: Secondary | ICD-10-CM | POA: Insufficient documentation

## 2014-06-10 DIAGNOSIS — Z7982 Long term (current) use of aspirin: Secondary | ICD-10-CM | POA: Insufficient documentation

## 2014-06-10 DIAGNOSIS — E785 Hyperlipidemia, unspecified: Secondary | ICD-10-CM | POA: Insufficient documentation

## 2014-06-10 DIAGNOSIS — Z9861 Coronary angioplasty status: Secondary | ICD-10-CM | POA: Insufficient documentation

## 2014-06-10 LAB — BASIC METABOLIC PANEL
Anion gap: 16 — ABNORMAL HIGH (ref 5–15)
BUN: 13 mg/dL (ref 6–23)
CO2: 19 meq/L (ref 19–32)
Calcium: 9 mg/dL (ref 8.4–10.5)
Chloride: 100 mEq/L (ref 96–112)
Creatinine, Ser: 0.66 mg/dL (ref 0.50–1.35)
GFR calc Af Amer: >90 mL/min (ref >90)
GFR calc non Af Amer: >90 mL/min (ref >90)
Glucose, Bld: 107 mg/dL — ABNORMAL HIGH (ref 70–99)
Potassium: 4.7 mEq/L (ref 3.7–5.3)
Sodium: 135 mEq/L — ABNORMAL LOW (ref 137–147)

## 2014-06-10 LAB — CBC
HCT: 42.1 % (ref 39.0–52.0)
Hemoglobin: 14.6 g/dL (ref 13.0–17.0)
MCH: 32.2 pg (ref 26.0–34.0)
MCHC: 34.7 g/dL (ref 30.0–36.0)
MCV: 92.7 fL (ref 78.0–100.0)
Platelets: 256 10*3/uL (ref 150–400)
RBC: 4.54 MIL/uL (ref 4.22–5.81)
RDW: 14.3 % (ref 11.5–15.5)
WBC: 8.6 10*3/uL (ref 4.0–10.5)

## 2014-06-10 LAB — I-STAT TROPONIN, ED: Troponin i, poc: 0 ng/mL (ref 0.00–0.08)

## 2014-06-10 LAB — TROPONIN I: Troponin I: <0.3 ng/mL (ref <0.30)

## 2014-06-10 LAB — PRO B NATRIURETIC PEPTIDE: Pro B Natriuretic peptide (BNP): 474.9 pg/mL — ABNORMAL HIGH (ref 0–125)

## 2014-06-10 MED ORDER — GI COCKTAIL ~~LOC~~
30.0000 mL | Freq: Once | ORAL | Status: AC
  Administered 2014-06-10: 30 mL via ORAL
  Filled 2014-06-10: qty 30

## 2014-06-10 MED ORDER — ASPIRIN 81 MG PO CHEW
243.0000 mg | CHEWABLE_TABLET | Freq: Once | ORAL | Status: AC
  Administered 2014-06-10: 243 mg via ORAL
  Filled 2014-06-10: qty 3

## 2014-06-10 MED ORDER — ONDANSETRON 4 MG PO TBDP
4.0000 mg | ORAL_TABLET | Freq: Once | ORAL | Status: AC
  Administered 2014-06-10: 4 mg via ORAL
  Filled 2014-06-10: qty 1

## 2014-06-10 NOTE — Telephone Encounter (Signed)
New Message  Pt called states that he has chest pain (non severe)  that is irritating him. He is not sure if it is gas.. pt has taken two nitro's. requests a call back to discuss.

## 2014-06-10 NOTE — Discharge Instructions (Signed)

## 2014-06-10 NOTE — ED Provider Notes (Signed)
CSN: [id number]     Arrival date & time 06/10/14  1339 History   First MD Initiated Contact with Patient 06/10/14 1418     Chief Complaint  Patient presents with  . Chest Pain     (Consider location/radiation/quality/duration/timing/severity/associated sxs/prior Treatment) HPI Pt is a 58yo male with hx of CAD s/p stent RCA 2000 adn 2015, HTN, hyperlipidemia, CHF, and ischemic cardiomyopathy presenting to ED c/o left sided chest pain that started earlier this morning, worsened around 11:30am, radiating into left upper arm, associated with diaphoresis and mild nausea. Pt states pain is dull and pressure like, states it "feels like an indigestion or bubble" in left side of chest.  Reports taking 3 nitro tablets PTA w/o relief.  Pt did take 81mg  aspirin PTA.  Denies vomiting or SOB. Denies recent illness, states he felt well yesterday.    Cardiologist: Dr. Peter Martinique.   Past Medical History  Diagnosis Date  . CAD (coronary artery disease)     a. 2000: s/p stent of RCA 2000 with BMS  . Tobacco abuse   . Obesity   . ST elevation myocardial infarction (STEMI) of anterior wall, subsequent episode of care   . HTN (hypertension)   . Hyperlipidemia   . Acute systolic CHF (congestive heart failure)   . Ischemic cardiomyopathy    Past Surgical History  Procedure Laterality Date  . Cardiac catheterization    . Coronary angioplasty  2000  . Coronary angioplasty with stent placement  2015   Family History  Problem Relation Age of Onset  . CAD Father     PTCA   History  Substance Use Topics  . Smoking status: Former Smoker -- 1.00 packs/day for 35 years    Types: Cigarettes    Quit date: 12/11/2013  . Smokeless tobacco: Not on file  . Alcohol Use: No    Review of Systems  Constitutional: Positive for chills and diaphoresis. Negative for fever and fatigue.  Cardiovascular: Positive for chest pain ( left side). Negative for palpitations and leg swelling.  Gastrointestinal: Positive  for nausea. Negative for vomiting, abdominal pain, diarrhea and constipation.  Genitourinary: Negative for flank pain and enuresis.  Musculoskeletal: Negative for back pain and myalgias.  All other systems reviewed and are negative.     Allergies  Review of patient's allergies indicates no known allergies.  Home Medications   Prior to Admission medications   Medication Sig Start Date End Date Taking? Authorizing Provider  allopurinol (ZYLOPRIM) 300 MG tablet Take 1 tablet (300 mg total) by mouth daily. 01/27/14  Yes Burtis Junes, NP  aspirin EC 81 MG tablet Take 81 mg by mouth daily.   Yes Historical Provider, MD  carvedilol (COREG) 6.25 MG tablet Take 9.375 mg by mouth 2 (two) times daily with a meal. Take 1 1/2 tablets  twice a day 03/17/14  Yes Peter M Martinique, MD  colchicine (COLCRYS) 0.6 MG tablet Take 0.6 mg by mouth daily as needed (gout flare ups).   Yes Historical Provider, MD  Fluocinonide 0.1 % CREA Apply 1 application topically 3 (three) times daily as needed (rash - on legs, hands and bottom).   Yes Historical Provider, MD  lisinopril (PRINIVIL,ZESTRIL) 10 MG tablet Take 1 tablet (10 mg total) by mouth daily. 12/18/13  Yes Peter M Martinique, MD  nitroGLYCERIN (NITROSTAT) 0.4 MG SL tablet Place 1 tablet (0.4 mg total) under the tongue every 5 (five) minutes as needed for chest pain. 01/26/14  Yes Burtis Junes, NP  pravastatin (PRAVACHOL) 40 MG tablet Take 40 mg by mouth at bedtime.   Yes Historical Provider, MD  spironolactone (ALDACTONE) 25 MG tablet Take 25 mg by mouth at bedtime.   Yes Historical Provider, MD  ticagrelor (BRILINTA) 90 MG TABS tablet Take 1 tablet (90 mg total) by mouth 2 (two) times daily. 04/18/14  Yes Tarri Fuller, PA-C  triamcinolone cream (KENALOG) 0.1 % Apply 1 application topically 5 (five) times daily as needed (rash on legs).   Yes Historical Provider, MD   BP 141/87  Pulse 59  Temp(Src) 98.6 F (37 C) (Oral)  Resp 12  Ht 6\' 1"  (1.854 m)  Wt 220 lb  (99.791 kg)  BMI 29.03 kg/m2  SpO2 96% Physical Exam  Nursing note and vitals reviewed. Constitutional: He appears well-developed and well-nourished. No distress.  Lying comfortably in exam bed, NAD  HENT:  Head: Normocephalic and atraumatic.  Eyes: Conjunctivae are normal. No scleral icterus.  Neck: Normal range of motion.  Cardiovascular: Normal rate, regular rhythm and normal heart sounds.   Regular rate and rhythm  Pulmonary/Chest: Effort normal and breath sounds normal. No respiratory distress. He has no wheezes. He has no rales. He exhibits no tenderness.  No respiratory distress, able to speak in full sentences w/o difficulty. Lungs: CTAB. No chest wall tenderness   Abdominal: Soft. Bowel sounds are normal. He exhibits no distension and no mass. There is no tenderness. There is no rebound and no guarding.  Soft, non-distended, non-tender. No CVAT  Musculoskeletal: Normal range of motion.  Neurological: He is alert.  Skin: Skin is warm and dry. He is not diaphoretic.    ED Course  Procedures (including critical care time) Labs Review Labs Reviewed  BASIC METABOLIC PANEL - Abnormal; Notable for the following:    Sodium 135 (*)    Glucose, Bld 107 (*)    Anion gap 16 (*)    All other components within normal limits  PRO B NATRIURETIC PEPTIDE - Abnormal; Notable for the following:    Pro B Natriuretic peptide (BNP) 474.9 (*)    All other components within normal limits  CBC  TROPONIN I  Randolm Idol, ED    Imaging Review Dg Chest 2 View  06/10/2014   CLINICAL DATA:  Left chest pain.  EXAM: CHEST  2 VIEW  COMPARISON:  04/18/2014  FINDINGS: Right AICD remains in place, unchanged. Coronary artery stents again noted. Heart and mediastinal contours are within normal limits. No focal opacities or effusions. No acute bony abnormality.  IMPRESSION: No active cardiopulmonary disease.   Electronically Signed   By: Rolm Baptise M.D.   On: 06/10/2014 14:21     EKG  Interpretation   Date/Time:  Wednesday June 10 2014 13:41:42 EDT Ventricular Rate:  74 PR Interval:  140 QRS Duration: 100 QT Interval:  354 QTC Calculation: 392 R Axis:   123 Text Interpretation:  Normal sinus rhythm Left posterior fascicular block  Nonspecific ST abnormality `no acute change compared w ecg  04/17/14  Reconfirmed by Ashok Cordia  MD, Lennette Bihari ([id number]) on 06/10/2014 2:22:22 PM      MDM   Final diagnoses:  Chest pain, unspecified chest pain type    Pt is a 58yo male with hx of CAD, stents and defibrillator in place, c/o left sided chest pain that started this morning, radiating down left arm, associated with cold sweats and mild nausea.  Took 3 nitro PTA w/o relief.  81mg  aspirin taken PTA.  In ED, pt given 243mg  aspirin  and 4mg  zofran.  Pt states he is comfortable, only mild discomfort in ED.   CBC, BMP, Troponin, BNP: unremarkable. CXR: no active cardiopulmonary disease.  EKG: consistent with previous on 04/17/14  Will get 3 hour Troponin at 17:00.  If negative for elevation, pt may be discharged home to f/u with PCP and cardiologist.   Serial troponin: negative.   Pt states he still has left shoulder and left chest discomfort. Pt states willing to try GI cocktail.  Mild improvement with GI cocktail.  Will discharge pt home to f/u with Dr. Martinique in 2 days.   Return precautions provided. Pt verbalized understanding and agreement with tx plan.  Discussed pt with Dr. Ashok Cordia throughout ED encounter, agrees with plan.   Noland Fordyce, PA-C 06/11/14 [phone]

## 2014-06-10 NOTE — ED Notes (Signed)
Pt reports left side chest discomfort this am, "feels like an indigestion or bubble in his chest." pain radiates into left shoulder and under his arm. No relief with nitro pta. Denies sob. ekg done at triage.

## 2014-06-10 NOTE — Telephone Encounter (Signed)
Returned call to patient he stated he is at [facility] ER waiting on Dr.Stated he has been having chest pian since mid morning.Stated he took NTG x 3 with no relief.Advised hope he will be better soon.Advised to call for appointment with Dr.Jordan when discharged from hospital.

## 2014-06-11 ENCOUNTER — Telehealth: Payer: Self-pay | Admitting: Cardiology

## 2014-06-11 ENCOUNTER — Telehealth (HOSPITAL_COMMUNITY): Payer: Self-pay | Admitting: *Deleted

## 2014-06-11 NOTE — Telephone Encounter (Signed)
Returned call to patient no answer.LMTC. 

## 2014-06-11 NOTE — Telephone Encounter (Signed)
New problem   Pt was seen in ED and was told to f/u up with Dr Martinique in 48 hrs. Please call pt with an appt.

## 2014-06-11 NOTE — Telephone Encounter (Signed)
Received call from patient he stated he went to [facility] ER 06/10/14 with chest pain.Stated he was told to see Dr.Jordan within 48 hrs.Stated he was feeling better this morning.No chest pain.Stated he was concerned about the chest pain he had yesterday.Appointment scheduled with Truitt Merle NP 06/12/14 at 2:30 pm.

## 2014-06-12 ENCOUNTER — Encounter: Payer: Self-pay | Admitting: Nurse Practitioner

## 2014-06-12 ENCOUNTER — Encounter (HOSPITAL_COMMUNITY): Payer: BC Managed Care – PPO

## 2014-06-12 ENCOUNTER — Ambulatory Visit (INDEPENDENT_AMBULATORY_CARE_PROVIDER_SITE_OTHER): Payer: BC Managed Care – PPO | Admitting: Nurse Practitioner

## 2014-06-12 VITALS — BP 110/70 | HR 67 | Ht 72.0 in | Wt 226.1 lb

## 2014-06-12 DIAGNOSIS — I2109 ST elevation (STEMI) myocardial infarction involving other coronary artery of anterior wall: Secondary | ICD-10-CM

## 2014-06-12 DIAGNOSIS — I2589 Other forms of chronic ischemic heart disease: Secondary | ICD-10-CM

## 2014-06-12 DIAGNOSIS — I255 Ischemic cardiomyopathy: Secondary | ICD-10-CM

## 2014-06-12 DIAGNOSIS — R079 Chest pain, unspecified: Secondary | ICD-10-CM

## 2014-06-12 MED ORDER — COLCHICINE 0.6 MG PO TABS: 0.6000 mg | ORAL_TABLET | Freq: Every day | ORAL | Status: DC | PRN

## 2014-06-12 MED ORDER — PRAVASTATIN SODIUM 40 MG PO TABS: 40.0000 mg | ORAL_TABLET | Freq: Every day | ORAL | Status: DC

## 2014-06-12 MED ORDER — FUROSEMIDE 20 MG PO TABS: 20.0000 mg | ORAL_TABLET | Freq: Every day | ORAL | Status: DC | PRN

## 2014-06-12 MED ORDER — SPIRONOLACTONE 25 MG PO TABS
25.0000 mg | ORAL_TABLET | Freq: Every day | ORAL | Status: DC
Start: 2014-06-12 — End: 2015-07-13

## 2014-06-12 MED ORDER — LISINOPRIL 10 MG PO TABS: 10.0000 mg | ORAL_TABLET | Freq: Every day | ORAL | Status: DC

## 2014-06-12 MED ORDER — CARVEDILOL 6.25 MG PO TABS: 12.7500 mg | ORAL_TABLET | Freq: Two times a day (BID) | ORAL | Status: DC

## 2014-06-12 MED ORDER — NITROGLYCERIN 0.4 MG SL SUBL
0.4000 mg | SUBLINGUAL_TABLET | SUBLINGUAL | Status: DC | PRN
Start: 2014-06-12 — End: 2015-06-17

## 2014-06-12 MED ORDER — ALLOPURINOL 300 MG PO TABS
300.0000 mg | ORAL_TABLET | Freq: Every day | ORAL | Status: DC
Start: 2014-06-12 — End: 2015-06-17

## 2014-06-12 NOTE — Progress Notes (Signed)
Kerry Mason Date of Birth: 23-Apr-1956 Medical Record #[id number]  History of Present Illness: Kerry Mason is seen back today for a post ER visit. Seen for Dr. Alessandra Grout. He is a 58 year old male. He has known CAD with remote stenting of the RCA in 2000 and then lost to follow up. Had STEMI in January of 2015 - with cath showing occlusion of the proximal RCA with collaterals and occlusion of the proximal LAD. RCA occlusion felt to be chronic. EF 35% by cath and 30% by echo. Proximal and mid to distal LAD were stented with DES. This was associated with acute renal insufficiency that resolved. He has a life vest in place and has had a subsequent ICD implanted.   Other issues include tobacco abuse, gout, HLD, and HTN.  Seen here by Dr. [name] in May - referred for his ICD.  Went to the ER this past Wednesday morning - had atypical chest pain - seemed more GI related.   Comes in today. Here alone. His pain has totally resolved and not come back. He notes that his weight is fluctuating. He is trying to really watch his diet/ take his medicines and really stay on track with his health care. He feels that his heart is "a fibbing" - short lived. He feels sluggish. Did have some onion soup at the steak place several days prior.    Current Outpatient Prescriptions  Medication Sig Dispense Refill  . allopurinol (ZYLOPRIM) 300 MG tablet Take 1 tablet (300 mg total) by mouth daily.  30 tablet  6  . aspirin EC 81 MG tablet Take 81 mg by mouth daily.      . carvedilol (COREG) 6.25 MG tablet Take 9.375 mg by mouth 2 (two) times daily with a meal. Take 1 1/2 tablets  twice a day      . colchicine (COLCRYS) 0.6 MG tablet Take 0.6 mg by mouth daily as needed (gout flare ups).      . Fluocinonide 0.1 % CREA Apply 1 application topically 3 (three) times daily as needed (rash - on legs, hands and bottom).      Marland Kitchen lisinopril (PRINIVIL,ZESTRIL) 10 MG tablet Take 1 tablet (10 mg total) by mouth daily.  90  tablet  3  . nitroGLYCERIN (NITROSTAT) 0.4 MG SL tablet Place 1 tablet (0.4 mg total) under the tongue every 5 (five) minutes as needed for chest pain.  25 tablet  1  . pravastatin (PRAVACHOL) 40 MG tablet Take 40 mg by mouth at bedtime.      Marland Kitchen spironolactone (ALDACTONE) 25 MG tablet Take 25 mg by mouth at bedtime.      . ticagrelor (BRILINTA) 90 MG TABS tablet Take 1 tablet (90 mg total) by mouth 2 (two) times daily.  60 tablet  0  . triamcinolone cream (KENALOG) 0.1 % Apply 1 application topically 5 (five) times daily as needed (rash on legs).       No current facility-administered medications for this visit.    No Known Allergies  Past Medical History  Diagnosis Date  . CAD (coronary artery disease)     a. 2000: s/p stent of RCA 2000 with BMS  . Tobacco abuse   . Obesity   . ST elevation myocardial infarction (STEMI) of anterior wall, subsequent episode of care   . HTN (hypertension)   . Hyperlipidemia   . Acute systolic CHF (congestive heart failure)   . Ischemic cardiomyopathy     Past Surgical History  Procedure  Laterality Date  . Cardiac catheterization    . Coronary angioplasty  2000  . Coronary angioplasty with stent placement  2015    History  Smoking status  . Former Smoker -- 1.00 packs/day for 35 years  . Types: Cigarettes  . Quit date: 12/11/2013  Smokeless tobacco  . Not on file    History  Alcohol Use No    Family History  Problem Relation Age of Onset  . CAD Father     PTCA    Review of Systems: The review of systems is per the HPI.  All other systems were reviewed and are negative.  Physical Exam: BP 110/70  Pulse 67  Ht 6' (1.829 m)  Wt 226 lb 1.9 oz (102.567 kg)  BMI 30.66 kg/m2 Patient is very pleasant and in no acute distress. Skin is warm and dry. Color is normal.  HEENT is unremarkable. Normocephalic/atraumatic. PERRL. Sclera are nonicteric. Neck is supple. No masses. No JVD. Lungs are clear. Cardiac exam shows a regular rate and  rhythm. Abdomen is soft. Extremities are without edema. Gait and ROM are intact. No gross neurologic deficits noted.  Wt Readings from Last 3 Encounters:  06/12/14 226 lb 1.9 oz (102.567 kg)  06/10/14 220 lb (99.791 kg)  04/18/14 225 lb 12 oz (102.4 kg)    LABORATORY DATA/PROCEDURES:  Lab Results  Component Value Date   WBC 8.6 06/10/2014   HGB 14.6 06/10/2014   HCT 42.1 06/10/2014   PLT 256 06/10/2014   GLUCOSE 107* 06/10/2014   CHOL 123 01/26/2014   TRIG 141.0 01/26/2014   HDL 22.20* 01/26/2014   LDLCALC 73 01/26/2014   ALT 15 01/26/2014   AST 17 01/26/2014   NA 135* 06/10/2014   K 4.7 06/10/2014   CL 100 06/10/2014   CREATININE 0.66 06/10/2014   BUN 13 06/10/2014   CO2 19 06/10/2014   TSH 1.265 12/11/2013   INR 0.92 12/11/2013   HGBA1C 5.8* 12/11/2013    BNP (last 3 results)  Recent Labs  12/11/13 1645 12/18/13 0949 06/10/14 1349  PROBNP 750.4* 96.0 474.9*   Lab Results  Component Value Date   TROPONINI <0.30 06/10/2014   Dg Chest 2 View  06/10/2014    IMPRESSION: No active cardiopulmonary disease.   Electronically Signed   By: Rolm Baptise M.D.   On: 06/10/2014 14:21    Assessment / Plan: 1. Atypical chest pain - not returned - does not sound cardiac - will continue to monitor  2. Systolic heart failure - has had some salt - weight variable. Will let him have some low dose diuretic on hand to use as directed prn. He would like to increase his Coreg and "just see" if he can tolerate 12.5 mg BID.  3. Underlying ICD - palpitations - I actually got to speak to [employer] rep who was here in the office. The patient had a download earlier today. This was reviewed and is totally normal. No AF. Not pacing.   4. CAD - remains on Brilinta.  Will see back as planned.   Patient is agreeable to this plan and will call if any problems develop in the interim.   Burtis Junes, RN, [place] [address] [place], [place]  [address] [phone]

## 2014-06-12 NOTE — Patient Instructions (Addendum)
Stay on your current medicines but I have sent to the drug store a prescription for Lasix 20 mg to take only "as needed"  Increase your Coreg to 12.5 mg twice a day - this would be 2 of your 6.25 mg tabs twice a day  Keep restricting your salt  Keep your next OV with Dr. [name]  See Dr. Martinique in September  I have refilled all your medicines today  Call the [place] office at [phone] if you have any questions, problems or concerns.

## 2014-06-13 NOTE — ED Provider Notes (Signed)
Medical screening examination/treatment/procedure(s) were conducted as a shared visit with non-physician practitioner(s) and myself.  I personally evaluated the patient during the encounter.   EKG Interpretation   Date/Time:  Wednesday June 10 2014 13:41:42 EDT Ventricular Rate:  74 PR Interval:  140 QRS Duration: 100 QT Interval:  354 QTC Calculation: 392 R Axis:   123 Text Interpretation:  Normal sinus rhythm Left posterior fascicular block  Nonspecific ST abnormality `no acute change compared w ecg  04/17/14  Reconfirmed by [facility]  MD, Lennette Bihari ([id number]) on 06/10/2014 2:22:22 PM      Pt c/o gas like feeling mid to lower chest onset this am. States completely unlike prior chest pain w MI. No sob. No nv. Chest cta. Rrr. Labs. Cxr.  Plan repeat troponin.   Mirna Mires, MD 06/13/14 [phone]

## 2014-06-15 ENCOUNTER — Encounter (HOSPITAL_COMMUNITY)
Admission: RE | Admit: 2014-06-15 | Discharge: 2014-06-15 | Disposition: A | Discharge status: Home / Self Care | Payer: BC Managed Care – PPO | Source: Ambulatory Visit | Attending: Cardiology | Admitting: Cardiology

## 2014-06-15 DIAGNOSIS — Z5189 Encounter for other specified aftercare: Secondary | ICD-10-CM | POA: Insufficient documentation

## 2014-06-15 DIAGNOSIS — I219 Acute myocardial infarction, unspecified: Secondary | ICD-10-CM | POA: Insufficient documentation

## 2014-06-15 DIAGNOSIS — Z9861 Coronary angioplasty status: Secondary | ICD-10-CM | POA: Insufficient documentation

## 2014-06-17 ENCOUNTER — Encounter (HOSPITAL_COMMUNITY)
Admission: RE | Admit: 2014-06-17 | Discharge: 2014-06-17 | Disposition: A | Discharge status: Home / Self Care | Payer: BC Managed Care – PPO | Source: Ambulatory Visit | Attending: Cardiology | Admitting: Cardiology

## 2014-06-19 ENCOUNTER — Encounter (HOSPITAL_COMMUNITY): Payer: BC Managed Care – PPO

## 2014-06-22 ENCOUNTER — Encounter (HOSPITAL_COMMUNITY): Payer: BC Managed Care – PPO

## 2014-06-24 ENCOUNTER — Encounter (HOSPITAL_COMMUNITY)
Admission: RE | Admit: 2014-06-24 | Discharge: 2014-06-24 | Disposition: A | Discharge status: Home / Self Care | Payer: BC Managed Care – PPO | Source: Ambulatory Visit | Attending: Cardiology | Admitting: Cardiology

## 2014-06-26 ENCOUNTER — Other Ambulatory Visit: Payer: Self-pay | Admitting: Nurse Practitioner

## 2014-06-26 ENCOUNTER — Encounter (HOSPITAL_COMMUNITY): Payer: BC Managed Care – PPO

## 2014-06-29 ENCOUNTER — Encounter (HOSPITAL_COMMUNITY)
Admission: RE | Admit: 2014-06-29 | Discharge: 2014-06-29 | Disposition: A | Discharge status: Home / Self Care | Payer: BC Managed Care – PPO | Source: Ambulatory Visit | Attending: Cardiology | Admitting: Cardiology

## 2014-07-01 ENCOUNTER — Encounter (HOSPITAL_COMMUNITY): Payer: BC Managed Care – PPO

## 2014-07-03 ENCOUNTER — Encounter (HOSPITAL_COMMUNITY)
Admission: RE | Admit: 2014-07-03 | Discharge: 2014-07-03 | Disposition: A | Discharge status: Home / Self Care | Payer: BC Managed Care – PPO | Source: Ambulatory Visit | Attending: Cardiology | Admitting: Cardiology

## 2014-07-06 ENCOUNTER — Ambulatory Visit (HOSPITAL_COMMUNITY): Payer: BC Managed Care – PPO

## 2014-07-08 ENCOUNTER — Encounter (HOSPITAL_COMMUNITY): Payer: BC Managed Care – PPO

## 2014-07-10 ENCOUNTER — Encounter (HOSPITAL_COMMUNITY): Payer: BC Managed Care – PPO

## 2014-07-13 ENCOUNTER — Encounter (HOSPITAL_COMMUNITY): Payer: BC Managed Care – PPO

## 2014-07-15 ENCOUNTER — Encounter (HOSPITAL_COMMUNITY): Payer: BC Managed Care – PPO

## 2014-07-16 ENCOUNTER — Other Ambulatory Visit: Payer: Self-pay

## 2014-07-16 DIAGNOSIS — I255 Ischemic cardiomyopathy: Secondary | ICD-10-CM

## 2014-07-16 MED ORDER — CARVEDILOL 6.25 MG PO TABS: 12.7500 mg | ORAL_TABLET | Freq: Two times a day (BID) | ORAL | Status: DC

## 2014-07-17 ENCOUNTER — Encounter (HOSPITAL_COMMUNITY): Admission: RE | Admit: 2014-07-17 | Payer: BC Managed Care – PPO | Source: Ambulatory Visit

## 2014-07-20 ENCOUNTER — Encounter (HOSPITAL_COMMUNITY): Payer: BC Managed Care – PPO

## 2014-07-21 ENCOUNTER — Encounter: Payer: Self-pay | Admitting: Internal Medicine

## 2014-07-21 ENCOUNTER — Ambulatory Visit (INDEPENDENT_AMBULATORY_CARE_PROVIDER_SITE_OTHER): Payer: BC Managed Care – PPO | Admitting: Internal Medicine

## 2014-07-21 VITALS — BP 126/82 | HR 74 | Ht 73.0 in | Wt 216.0 lb

## 2014-07-21 DIAGNOSIS — I2589 Other forms of chronic ischemic heart disease: Secondary | ICD-10-CM

## 2014-07-21 DIAGNOSIS — Z95810 Presence of automatic (implantable) cardiac defibrillator: Secondary | ICD-10-CM

## 2014-07-21 DIAGNOSIS — I255 Ischemic cardiomyopathy: Secondary | ICD-10-CM

## 2014-07-21 DIAGNOSIS — I5022 Chronic systolic (congestive) heart failure: Secondary | ICD-10-CM

## 2014-07-21 LAB — MDC_IDC_ENUM_SESS_TYPE_INCLINIC
Battery Voltage: 3.12 V
Brady Statistic RV Percent Paced: 0 %
Date Time Interrogation Session: 20150818151500
HIGH POWER IMPEDANCE MEASURED VALUE: 75 Ohm
Implantable Pulse Generator Model: 383594
Lead Channel Sensing Intrinsic Amplitude: 9.4 mV
Lead Channel Setting Pacing Pulse Width: 0.4 ms
Lead Channel Setting Sensing Sensitivity: 0.8 mV
MDC IDC MSMT LEADCHNL RV IMPEDANCE VALUE: 463 Ohm
MDC IDC MSMT LEADCHNL RV PACING THRESHOLD AMPLITUDE: 0.5 V
MDC IDC MSMT LEADCHNL RV PACING THRESHOLD PULSEWIDTH: 0.4 ms
MDC IDC MSMT LEADCHNL RV SENSING INTR AMPL: 21.7 mV
MDC IDC PG SERIAL: 60800912
MDC IDC SET LEADCHNL RV PACING AMPLITUDE: 3 V
Zone Setting Detection Interval: 250 ms
Zone Setting Detection Interval: 310 ms

## 2014-07-21 NOTE — Assessment & Plan Note (Signed)
His symptoms are class 2. No change in medical therapy. I have encouraged him to increase his physical activity.

## 2014-07-21 NOTE — Assessment & Plan Note (Signed)
He denies anginal symptoms. No change in meds. He is encouraged to increase his physical activity.

## 2014-07-21 NOTE — Patient Instructions (Signed)
Your physician wants you to follow-up in: [place]. You will receive a reminder letter in the mail two months in advance. If you don't receive a letter, please call our office to schedule the follow-up appointment.  Remote monitoring is used to monitor your Pacemaker of ICD from home. This monitoring reduces the number of office visits required to check your device to one time per year. It allows [place] to keep an eye on the functioning of your device to ensure it is working properly. You are scheduled for a device check from home on 10/20/2014. You may send your transmission at any time that day. If you have a wireless device, the transmission will be sent automatically. After your physician reviews your transmission, you will receive a postcard with your next transmission date.

## 2014-07-21 NOTE — Progress Notes (Signed)
HPI [name] returns today for followup s/p ICD implantation. He is a pleasant middle aged man with known CAD, s/p MI, with an EF 30% despite maximal medical therapy after revascularization 8  months ago.  He has class 2. CHF symptoms. In the interim, no ICD shocks.  No Known Allergies   Current Outpatient Prescriptions  Medication Sig Dispense Refill  . allopurinol (ZYLOPRIM) 300 MG tablet Take 1 tablet (300 mg total) by mouth daily.  90 tablet  3  . aspirin EC 81 MG tablet Take 81 mg by mouth daily.      Marland Kitchen BRILINTA 90 MG TABS tablet TAKE ONE TABLET BY MOUTH TWICE DAILY  60 tablet  6  . carvedilol (COREG) 6.25 MG tablet Take 2 tablets (12.5 mg total) by mouth 2 (two) times daily with a meal.  360 tablet  3  . colchicine (COLCRYS) 0.6 MG tablet Take 1 tablet (0.6 mg total) by mouth daily as needed (gout flare ups).  30 tablet  6  . Fluocinonide 0.1 % CREA Apply 1 application topically 3 (three) times daily as needed (rash - on legs, hands and bottom).      . furosemide (LASIX) 20 MG tablet Take 1 tablet (20 mg total) by mouth daily as needed. To take "as needed" for weight gain of 2 to 3 pounds overnight  30 tablet  3  . lisinopril (PRINIVIL,ZESTRIL) 10 MG tablet Take 1 tablet (10 mg total) by mouth daily.  90 tablet  3  . nitroGLYCERIN (NITROSTAT) 0.4 MG SL tablet Place 1 tablet (0.4 mg total) under the tongue every 5 (five) minutes as needed for chest pain.  25 tablet  11  . pravastatin (PRAVACHOL) 40 MG tablet Take 1 tablet (40 mg total) by mouth at bedtime.  90 tablet  3  . spironolactone (ALDACTONE) 25 MG tablet Take 1 tablet (25 mg total) by mouth at bedtime.  90 tablet  3  . triamcinolone cream (KENALOG) 0.1 % Apply 1 application topically 5 (five) times daily as needed (rash on legs).       No current facility-administered medications for this visit.     Past Medical History  Diagnosis Date  . CAD (coronary artery disease)     a. 2000: s/p stent of RCA 2000 with BMS    . Tobacco abuse   . Obesity   . ST elevation myocardial infarction (STEMI) of anterior wall, subsequent episode of care   . HTN (hypertension)   . Hyperlipidemia   . Acute systolic CHF (congestive heart failure)   . Ischemic cardiomyopathy     ROS:   All systems reviewed and negative except as noted in the HPI.   Past Surgical History  Procedure Laterality Date  . Cardiac catheterization    . Coronary angioplasty  2000  . Coronary angioplasty with stent placement  2015     Family History  Problem Relation Age of Onset  . CAD Father     PTCA     History   Social History  . Marital Status: Married    Spouse Name: N/A    Number of Children: 2  . 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 Smoking status: Former Smoker -- 1.00 packs/day for 35 years    Types: Cigarettes    Quit date: 12/11/2013  . Smokeless tobacco: Not on file  . Alcohol Use: No  .  Drug Use: No  . Sexual Activity: Not Currently   Other Topics Concern  . Not on file   Social History Narrative  . No narrative on file     BP 126/82  Pulse 74  Ht 6\' 1"  (1.854 m)  Wt 216 lb (97.977 kg)  BMI 28.50 kg/m2  Physical Exam:  Well appearing middle aged man, NAD HEENT: Unremarkable Neck:  No JVD, no thyromegally Back:  No CVA tenderness Lungs:  Clear with no wheezes HEART:  Regular rate rhythm, no murmurs, no rubs, no clicks Abd:  soft, positive bowel sounds, no organomegally, no rebound, no guarding Ext:  2 plus pulses, no edema, no cyanosis, no clubbing Skin:  No rashes no nodules Neuro:  CN II through XII intact, motor grossly intact  EKG - reviewed, NSR   Assess/Plan:

## 2014-07-21 NOTE — Assessment & Plan Note (Signed)
His Biotronik device is working normally. Will recheck in several months.  

## 2014-07-22 ENCOUNTER — Encounter (HOSPITAL_COMMUNITY): Payer: BC Managed Care – PPO

## 2014-07-24 ENCOUNTER — Encounter (HOSPITAL_COMMUNITY): Payer: BC Managed Care – PPO

## 2014-07-27 ENCOUNTER — Encounter (HOSPITAL_COMMUNITY): Payer: BC Managed Care – PPO

## 2014-07-29 ENCOUNTER — Encounter (HOSPITAL_COMMUNITY): Payer: BC Managed Care – PPO

## 2014-07-31 ENCOUNTER — Encounter (HOSPITAL_COMMUNITY): Payer: BC Managed Care – PPO

## 2014-08-12 ENCOUNTER — Encounter: Payer: Self-pay | Admitting: Internal Medicine

## 2014-08-14 ENCOUNTER — Ambulatory Visit (INDEPENDENT_AMBULATORY_CARE_PROVIDER_SITE_OTHER): Payer: BC Managed Care – PPO | Admitting: Cardiology

## 2014-08-14 ENCOUNTER — Encounter: Payer: Self-pay | Admitting: Cardiology

## 2014-08-14 VITALS — BP 126/62 | HR 72 | Ht 73.0 in | Wt 214.8 lb

## 2014-08-14 DIAGNOSIS — E785 Hyperlipidemia, unspecified: Secondary | ICD-10-CM

## 2014-08-14 DIAGNOSIS — I10 Essential (primary) hypertension: Secondary | ICD-10-CM

## 2014-08-14 DIAGNOSIS — Z95810 Presence of automatic (implantable) cardiac defibrillator: Secondary | ICD-10-CM

## 2014-08-14 DIAGNOSIS — I2510 Atherosclerotic heart disease of native coronary artery without angina pectoris: Secondary | ICD-10-CM

## 2014-08-14 DIAGNOSIS — I5022 Chronic systolic (congestive) heart failure: Secondary | ICD-10-CM

## 2014-08-14 MED ORDER — CARVEDILOL 12.5 MG PO TABS
12.5000 mg | ORAL_TABLET | Freq: Two times a day (BID) | ORAL | Status: DC
Start: 2014-08-14 — End: 2014-12-14

## 2014-08-14 MED ORDER — LISINOPRIL 20 MG PO TABS: 20.0000 mg | ORAL_TABLET | Freq: Every day | ORAL | Status: DC

## 2014-08-14 NOTE — Progress Notes (Signed)
Kerry Mason Date of Birth: 1956-08-31 Medical Record #[id number]  History of Present Illness: Kerry Mason is seen back today for a follow up visit.  He has known CAD with remote stenting of the RCA in 2000 and then lost to follow up. Had STEMI in January of 2015 - with cath showing occlusion of the proximal RCA with collaterals and occlusion of the proximal LAD. RCA occlusion felt to be chronic. EF 35% by cath and 30% by echo. Proximal and mid to distal LAD were stented with DES. This was associated with acute renal insufficiency that resolved. He  had a subsequent ICD implanted. Other issues include tobacco abuse, gout, HLD, and HTN. On follow up today he states he feels the best that he has in 20 years. No chest pain. Complains of some skipping in his heart rhythm at night but ICD check in August showed no arrhythmia. He still gets some SOB walking up incline or lifting. Exercising regularly. He has lost an additional 12 lbs and is eating basically a vegetarian diet. He is not smoking.    Current Outpatient Prescriptions  Medication Sig Dispense Refill  . allopurinol (ZYLOPRIM) 300 MG tablet Take 1 tablet (300 mg total) by mouth daily.  90 tablet  3  . aspirin EC 81 MG tablet Take 81 mg by mouth daily.      Marland Kitchen BRILINTA 90 MG TABS tablet TAKE ONE TABLET BY MOUTH TWICE DAILY  60 tablet  6  . colchicine (COLCRYS) 0.6 MG tablet Take 1 tablet (0.6 mg total) by mouth daily as needed (gout flare ups).  30 tablet  6  . Fluocinonide 0.1 % CREA Apply 1 application topically 3 (three) times daily as needed (rash - on legs, hands and bottom).      . furosemide (LASIX) 20 MG tablet Take 1 tablet (20 mg total) by mouth daily as needed. To take "as needed" for weight gain of 2 to 3 pounds overnight  30 tablet  3  . nitroGLYCERIN (NITROSTAT) 0.4 MG SL tablet Place 1 tablet (0.4 mg total) under the tongue every 5 (five) minutes as needed for chest pain.  25 tablet  11  . pravastatin (PRAVACHOL) 40 MG tablet  Take 1 tablet (40 mg total) by mouth at bedtime.  90 tablet  3  . spironolactone (ALDACTONE) 25 MG tablet Take 1 tablet (25 mg total) by mouth at bedtime.  90 tablet  3  . triamcinolone cream (KENALOG) 0.1 % Apply 1 application topically 5 (five) times daily as needed (rash on legs).      . carvedilol (COREG) 12.5 MG tablet Take 1 tablet (12.5 mg total) by mouth 2 (two) times daily.  180 tablet  3  . lisinopril (PRINIVIL,ZESTRIL) 20 MG tablet Take 1 tablet (20 mg total) by mouth daily.  90 tablet  3   No current facility-administered medications for this visit.    No Known Allergies  Past Medical History  Diagnosis Date  . CAD (coronary artery disease)     a. 2000: s/p stent of RCA 2000 with BMS  . Tobacco abuse   . Obesity   . ST elevation myocardial infarction (STEMI) of anterior wall, subsequent episode of care   . HTN (hypertension)   . Hyperlipidemia   . Acute systolic CHF (congestive heart failure)   . Ischemic cardiomyopathy     Past Surgical History  Procedure Laterality Date  . Cardiac catheterization    . Coronary angioplasty  2000  . Coronary angioplasty  with stent placement  2015    History  Smoking status  . Former Smoker -- 1.00 packs/day for 35 years  . Types: Cigarettes  . Quit date: 12/11/2013  Smokeless tobacco  . Not on file    History  Alcohol Use No    Family History  Problem Relation Age of Onset  . CAD Father     PTCA    Review of Systems: The review of systems is per the HPI.  All other systems were reviewed and are negative.  Physical Exam: BP 126/62  Pulse 72  Ht 6\' 1"  (1.854 m)  Wt 214 lb 12.8 oz (97.433 kg)  BMI 28.35 kg/m2 Patient is very pleasant and in no acute distress. Skin is warm and dry. Color is normal.  HEENT is unremarkable. Normocephalic/atraumatic. PERRL. Sclera are nonicteric. Neck is supple. No masses. No JVD. ICD site has healed well. Lungs are clear. Cardiac exam shows a regular rate and rhythm. normal S1-2. No  gallop or murmur. Abdomen is soft. Extremities are without edema. Gait and ROM are intact. No gross neurologic deficits noted.  Wt Readings from Last 3 Encounters:  08/14/14 214 lb 12.8 oz (97.433 kg)  07/21/14 216 lb (97.977 kg)  06/12/14 226 lb 1.9 oz (102.567 kg)    LABORATORY DATA/PROCEDURES:  Lab Results  Component Value Date   WBC 8.6 06/10/2014   HGB 14.6 06/10/2014   HCT 42.1 06/10/2014   PLT 256 06/10/2014   GLUCOSE 107* 06/10/2014   CHOL 123 01/26/2014   TRIG 141.0 01/26/2014   HDL 22.20* 01/26/2014   LDLCALC 73 01/26/2014   ALT 15 01/26/2014   AST 17 01/26/2014   NA 135* 06/10/2014   K 4.7 06/10/2014   CL 100 06/10/2014   CREATININE 0.66 06/10/2014   BUN 13 06/10/2014   CO2 19 06/10/2014   TSH 1.265 12/11/2013   INR 0.92 12/11/2013   HGBA1C 5.8* 12/11/2013    BNP (last 3 results)  Recent Labs  12/11/13 1645 12/18/13 0949 06/10/14 1349  PROBNP 750.4* 96.0 474.9*   Lab Results  Component Value Date   TROPONINI <0.30 06/10/2014   Dg Chest 2 View  06/10/2014    IMPRESSION: No active cardiopulmonary disease.   Electronically Signed   By: Rolm Baptise M.D.   On: 06/10/2014 14:21    Assessment / Plan: 1. CAD s/p anterior STEMI with extensive LAD stenting. Old occlusion of RCA. No significant angina..  2. Chronic Systolic heart failure -EF 30-35% by Echo in April.  Well compensated. Will increase lisinopril to 20 mg daily. Continue aldactone 25 mg daily and Coreg 12.5 mg bid.   3. Underlying ICD - palpitations - continue beta blocker.   4. CAD - remains on Brilinta.  5. Tobacco abuse- now quit.   I will follow up in 6 months. Very encouraged with his lifestyle modifications.

## 2014-08-14 NOTE — Patient Instructions (Addendum)
Continue your current therapy. We will change your Coreg prescription to a 12.5 mg tablet so you can take one tablet twice a day. We will increase lisinopril to 20 mg daily.  I will see you in 6 months.

## 2014-09-03 ENCOUNTER — Telehealth: Payer: Self-pay

## 2014-09-03 NOTE — Telephone Encounter (Signed)
Received surgical clearance form from [facility].Dr.Jordan advised to post pone surgery until 12/2014 patient needs to remain on dual anti platelet therapy aspirin/brilinta until then.Form faxed back to fax # [phone].

## 2014-09-14 ENCOUNTER — Telehealth: Payer: Self-pay | Admitting: *Deleted

## 2014-09-14 NOTE — Telephone Encounter (Signed)
Per vm left on refill line patient would like to try the cheaper alternative to brilinta that he and Dr Martinique had discussed as his insurance no loner covers the brilinta. Please advise. Thanks, MI

## 2014-09-15 NOTE — Telephone Encounter (Signed)
Returned call to patient he stated he has been out of Brilinta for about 1 week.Stated he wanted me to ask Dr.Jordan for a alternative medication.Stated Brilinta cost too much.Advised to pick up Brilinta 90 mg samples left at [facility] office.Advised very important to continue Brilinta. Advised will check with Dr.Jordan and call back.

## 2014-09-18 ENCOUNTER — Other Ambulatory Visit: Payer: Self-pay

## 2014-09-18 NOTE — Telephone Encounter (Signed)
Returned call to patient no answer.LMTC. 

## 2014-09-25 NOTE — Telephone Encounter (Signed)
Returned call to patient no answer.LMTC. 

## 2014-09-30 NOTE — Telephone Encounter (Signed)
Returned call to patient no answer.LMTC. 

## 2014-09-30 NOTE — Telephone Encounter (Signed)
Patient's daughter Ria Comment called no answer.Left message father has not returned my calls.Left message to call me back.

## 2014-10-01 ENCOUNTER — Telehealth: Payer: Self-pay | Admitting: Cardiology

## 2014-10-01 MED ORDER — CLOPIDOGREL BISULFATE 75 MG PO TABS: 75.0000 mg | ORAL_TABLET | Freq: Every day | ORAL | Status: DC

## 2014-10-01 NOTE — Telephone Encounter (Signed)
Returned call to patient Dr.Jordan advised ok when finished with Brilinta to start Plavix 75 mg daily.Prescription sent to pharmacy.

## 2014-10-01 NOTE — Telephone Encounter (Signed)
See previous 10/01/14 note.

## 2014-10-01 NOTE — Telephone Encounter (Signed)
Returning your call. °

## 2014-10-20 ENCOUNTER — Ambulatory Visit (INDEPENDENT_AMBULATORY_CARE_PROVIDER_SITE_OTHER): Payer: Medicaid Other | Admitting: *Deleted

## 2014-10-20 DIAGNOSIS — I255 Ischemic cardiomyopathy: Secondary | ICD-10-CM

## 2014-10-20 LAB — MDC_IDC_ENUM_SESS_TYPE_REMOTE
Brady Statistic RV Percent Paced: 0 %
HIGH POWER IMPEDANCE MEASURED VALUE: 73 Ohm
Lead Channel Sensing Intrinsic Amplitude: 15.1 mV
Lead Channel Sensing Intrinsic Amplitude: 8 mV
Lead Channel Setting Pacing Amplitude: 3 V
Lead Channel Setting Sensing Sensitivity: 0.8 mV
MDC IDC MSMT BATTERY VOLTAGE: 3.11 V
MDC IDC MSMT LEADCHNL RV IMPEDANCE VALUE: 478 Ohm
MDC IDC PG SERIAL: 60800912
MDC IDC SET LEADCHNL RV PACING PULSEWIDTH: 0.4 ms
MDC IDC SET ZONE DETECTION INTERVAL: 310 ms
Zone Setting Detection Interval: 250 ms

## 2014-10-20 NOTE — Progress Notes (Signed)
Remote ICD transmission.   

## 2014-10-22 ENCOUNTER — Encounter: Payer: Self-pay | Admitting: Cardiology

## 2014-10-28 ENCOUNTER — Encounter: Payer: Self-pay | Admitting: Cardiology

## 2014-11-02 ENCOUNTER — Telehealth: Payer: Self-pay | Admitting: Cardiology

## 2014-11-02 NOTE — Telephone Encounter (Signed)
Returned call to patient ok to take a Z Pac.

## 2014-11-02 NOTE — Telephone Encounter (Signed)
New message     Is it ok for pt to take Zpak for pneumonia?  He is on brilinta.

## 2014-11-10 ENCOUNTER — Encounter: Payer: Self-pay | Admitting: Cardiology

## 2014-11-12 ENCOUNTER — Encounter (HOSPITAL_COMMUNITY): Payer: Self-pay | Admitting: Cardiology

## 2014-11-12 ENCOUNTER — Encounter: Payer: Self-pay | Admitting: Internal Medicine

## 2014-12-14 ENCOUNTER — Telehealth: Payer: Self-pay | Admitting: Cardiology

## 2014-12-14 MED ORDER — CARVEDILOL 12.5 MG PO TABS: 12.5000 mg | ORAL_TABLET | Freq: Two times a day (BID) | ORAL | Status: DC

## 2014-12-14 NOTE — Telephone Encounter (Signed)
New Message   Pt requested to speak w/ Rn about if he needs Jan appt. Please call back and discuss.

## 2014-12-14 NOTE — Telephone Encounter (Signed)
Returned call to patient he stated he has been confused on how to take medication.Stated he took Lisinopril 40 mg daily instead of 20 mg daily for about 1 month.Stated he is now taking Lisinopril 20 mg daily.Medication reviewed with patient and he understands how to take.Also stated he watches his salt and twice has had a increase of weight 10 to 12 lbs within 2 days.Stated at present his weight is 194 lbs.Stated no sob,no swelling.Also complains of having dizziness and has almost blacked out twice B/P 102/64.Appointment scheduled with Dr.Jordan 12/18/14.Advised to bring B/P readings.

## 2014-12-15 ENCOUNTER — Inpatient Hospital Stay (HOSPITAL_COMMUNITY): Admission: RE | Admit: 2014-12-15 | Payer: BC Managed Care – PPO | Source: Ambulatory Visit

## 2014-12-18 ENCOUNTER — Ambulatory Visit (INDEPENDENT_AMBULATORY_CARE_PROVIDER_SITE_OTHER): Payer: Medicaid Other | Admitting: Cardiology

## 2014-12-18 ENCOUNTER — Encounter: Payer: Self-pay | Admitting: Cardiology

## 2014-12-18 VITALS — BP 130/81 | HR 80 | Ht 73.0 in | Wt 209.0 lb

## 2014-12-18 DIAGNOSIS — I2109 ST elevation (STEMI) myocardial infarction involving other coronary artery of anterior wall: Secondary | ICD-10-CM

## 2014-12-18 DIAGNOSIS — I2510 Atherosclerotic heart disease of native coronary artery without angina pectoris: Secondary | ICD-10-CM

## 2014-12-18 DIAGNOSIS — I5022 Chronic systolic (congestive) heart failure: Secondary | ICD-10-CM

## 2014-12-18 DIAGNOSIS — Z720 Tobacco use: Secondary | ICD-10-CM

## 2014-12-18 DIAGNOSIS — I255 Ischemic cardiomyopathy: Secondary | ICD-10-CM

## 2014-12-18 DIAGNOSIS — Z95810 Presence of automatic (implantable) cardiac defibrillator: Secondary | ICD-10-CM

## 2014-12-18 MED ORDER — CLOPIDOGREL BISULFATE 75 MG PO TABS: 75.0000 mg | ORAL_TABLET | Freq: Every day | ORAL | Status: DC

## 2014-12-18 MED ORDER — TICAGRELOR 90 MG PO TABS: 90.0000 mg | ORAL_TABLET | Freq: Two times a day (BID) | ORAL | Status: DC

## 2014-12-18 MED ORDER — FUROSEMIDE 20 MG PO TABS: 20.0000 mg | ORAL_TABLET | Freq: Every day | ORAL | Status: DC

## 2014-12-18 NOTE — Patient Instructions (Signed)
We will switch Brilinta to Plavix 75 mg daily  Take lasix 20 mg daily  We will schedule you for a nuclear stress test and fasting lab work.

## 2014-12-18 NOTE — Progress Notes (Signed)
Kerry Mason Date of Birth: [birth year] Medical Record #[id number]  History of Present Illness: [name] is seen back today for follow up of CAD and CHF.  He has known CAD with remote stenting of the RCA in 2000 and then lost to follow up. Had STEMI in January of 2015 - with cath showing occlusion of the proximal RCA with collaterals and occlusion of the proximal LAD. RCA occlusion felt to be chronic. EF 35% by cath and 30% by echo. Proximal and mid to distal LAD were stented with DES. This was associated with acute renal insufficiency that resolved. He  had a subsequent ICD implanted. Other issues include tobacco abuse- now stopped, gout, HLD, and HTN. On follow up today he reports he has not done well since Thanksgiving. He states he had PNA and was treated with a Zpak. He had a mix up in his medication and was taking more Coreg than prescribed. He switched from Plavix to Brilinta. He has noted a lot of fluctuation of his weight. He has some spells of SOB and acute sweats. He has some pain between his shoulder blades similar to his MI pain. He has some dizziness.    Current Outpatient Prescriptions  Medication Sig Dispense Refill  . allopurinol (ZYLOPRIM) 300 MG tablet Take 1 tablet (300 mg total) by mouth daily. 90 tablet 3  . aspirin EC 81 MG tablet Take 81 mg by mouth daily.    . carvedilol (COREG) 12.5 MG tablet Take 1 tablet (12.5 mg total) by mouth 2 (two) times daily. 60 tablet 6  . colchicine (COLCRYS) 0.6 MG tablet Take 1 tablet (0.6 mg total) by mouth daily as needed (gout flare ups). 30 tablet 6  . Fluocinonide 0.1 % CREA Apply 1 application topically 3 (three) times daily as needed (rash - on legs, hands and bottom).    . furosemide (LASIX) 20 MG tablet Take 1 tablet (20 mg total) by mouth daily. To take "as needed" for weight gain of 2 to 3 pounds overnight 90 tablet 3  . lisinopril (PRINIVIL,ZESTRIL) 20 MG tablet Take 1 tablet (20 mg total) by mouth daily. 90 tablet 3  .  Multiple Vitamin (MULTIVITAMIN WITH MINERALS) TABS tablet Take 1 tablet by mouth daily.    . nitroGLYCERIN (NITROSTAT) 0.4 MG SL tablet Place 1 tablet (0.4 mg total) under the tongue every 5 (five) minutes as needed for chest pain. 25 tablet 11  . pravastatin (PRAVACHOL) 40 MG tablet Take 1 tablet (40 mg total) by mouth at bedtime. 90 tablet 3  . spironolactone (ALDACTONE) 25 MG tablet Take 1 tablet (25 mg total) by mouth at bedtime. 90 tablet 3  . triamcinolone cream (KENALOG) 0.1 % Apply 1 application topically 5 (five) times daily as needed (rash on legs).    . clopidogrel (PLAVIX) 75 MG tablet Take 1 tablet (75 mg total) by mouth daily. 90 tablet 3   No current facility-administered medications for this visit.    No Known Allergies  Past Medical History  Diagnosis Date  . CAD (coronary artery disease)     a. 2000: s/p stent of RCA 2000 with BMS  . Tobacco abuse   . Obesity   . ST elevation myocardial infarction (STEMI) of anterior wall, subsequent episode of care   . HTN (hypertension)   . Hyperlipidemia   . Acute systolic CHF (congestive heart failure)   . Ischemic cardiomyopathy     Past Surgical History  Procedure Laterality Date  . Cardiac catheterization    .  Coronary angioplasty  2000  . Coronary angioplasty with stent placement  2015  . Left heart catheterization with coronary angiogram N/A 12/11/2013    Procedure: LEFT HEART CATHETERIZATION WITH CORONARY ANGIOGRAM;  Surgeon: Sasha Rueth M Martinique, MD;  Location: [facility] CATH LAB;  Service: Cardiovascular;  Laterality: N/A;  . Percutaneous coronary stent intervention (pci-s)  12/11/2013    Procedure: PERCUTANEOUS CORONARY STENT INTERVENTION (PCI-S);  Surgeon: Kamrin Sibley M Martinique, MD;  Location: [facility] CATH LAB;  Service: Cardiovascular;;  prov LAD and mid LAD  . Implantable cardioverter defibrillator implant N/A 04/17/2014    Procedure: IMPLANTABLE CARDIOVERTER DEFIBRILLATOR IMPLANT;  Surgeon: Evans Lance, MD;  Location: [facility] CATH LAB;  Service:  Cardiovascular;  Laterality: N/A;    History  Smoking status  . Former Smoker -- 1.00 packs/day for 35 years  . Types: Cigarettes  . Quit date: 12/11/2013  Smokeless tobacco  . Not on file    History  Alcohol Use No    Family History  Problem Relation Age of Onset  . CAD Father     PTCA    Review of Systems: The review of systems is per the HPI.  All other systems were reviewed and are negative.  Physical Exam: BP 130/81 mmHg  Pulse 80  Ht 6\' 1"  (1.854 m)  Wt 209 lb (94.802 kg)  BMI 27.58 kg/m2 Patient is very pleasant and in no acute distress. Skin is warm and dry. Color is normal.  HEENT is unremarkable. Normocephalic/atraumatic. PERRL. Sclera are nonicteric. Neck is supple. No masses. No JVD. ICD site has healed well. Lungs are clear. Cardiac exam shows a regular rate and rhythm. normal S1-2. No gallop or murmur. Abdomen is soft. Extremities are without edema. Gait and ROM are intact. No gross neurologic deficits noted.  Wt Readings from Last 3 Encounters:  12/18/14 209 lb (94.802 kg)  08/14/14 214 lb 12.8 oz (97.433 kg)  07/21/14 216 lb (97.977 kg)    LABORATORY DATA/PROCEDURES:  Lab Results  Component Value Date   WBC 8.6 06/10/2014   HGB 14.6 06/10/2014   HCT 42.1 06/10/2014   PLT 256 06/10/2014   GLUCOSE 107* 06/10/2014   CHOL 123 01/26/2014   TRIG 141.0 01/26/2014   HDL 22.20* 01/26/2014   LDLCALC 73 01/26/2014   ALT 15 01/26/2014   AST 17 01/26/2014   NA 135* 06/10/2014   K 4.7 06/10/2014   CL 100 06/10/2014   CREATININE 0.66 06/10/2014   BUN 13 06/10/2014   CO2 19 06/10/2014   TSH 1.265 12/11/2013   INR 0.92 12/11/2013   HGBA1C 5.8* 12/11/2013    BNP (last 3 results)  Recent Labs  06/10/14 1349  PROBNP 474.9*   Lab Results  Component Value Date   TROPONINI <0.30 06/10/2014      Assessment / Plan: 1. CAD s/p anterior STEMI with extensive LAD stenting. Old occlusion of RCA. Recent atypical chest pain symptoms similar to prior  infarct pain. I have recommended a stress Myoview study to evaluate further. Given the extensive nature of stenting of the LAD I would recommend long term DAPT but with cost I would just take ASA and Plavix 75 mg daily.   2. Chronic Systolic heart failure -EF 30-35% by Echo in April 2015.  Will continue current Coreg, lisinopril, and aldactone Rx. Recommend he take lasix 20 mg daily. Will reassess EF with Myoview. Check BNP level.    3. Underlying ICD - palpitations - continue beta blocker. No significant arrhythmia noted on ICD interrogation in  Nov.  4. Hyperlipidemia. Will check fasting chemistries and lipids on Pravachol.   5. Tobacco abuse- now quit.   I will follow up in 6 months. Very encouraged with his lifestyle modifications.

## 2014-12-22 ENCOUNTER — Inpatient Hospital Stay (HOSPITAL_COMMUNITY): Admission: RE | Admit: 2014-12-22 | Payer: Medicaid Other | Source: Ambulatory Visit

## 2014-12-24 ENCOUNTER — Telehealth (HOSPITAL_COMMUNITY): Payer: Self-pay

## 2014-12-24 NOTE — Telephone Encounter (Signed)
Encounter complete. 

## 2014-12-29 ENCOUNTER — Encounter (HOSPITAL_COMMUNITY): Payer: Self-pay | Admitting: Vascular Surgery

## 2014-12-29 ENCOUNTER — Encounter (HOSPITAL_COMMUNITY): Payer: Self-pay

## 2014-12-29 ENCOUNTER — Encounter (HOSPITAL_COMMUNITY)
Admission: RE | Admit: 2014-12-29 | Discharge: 2014-12-29 | Disposition: A | Discharge status: Home / Self Care | Payer: Medicaid Other | Source: Ambulatory Visit | Attending: Orthopedic Surgery | Admitting: Orthopedic Surgery

## 2014-12-29 ENCOUNTER — Other Ambulatory Visit (HOSPITAL_COMMUNITY): Payer: Medicaid Other

## 2014-12-29 ENCOUNTER — Ambulatory Visit (HOSPITAL_COMMUNITY)
Admission: RE | Admit: 2014-12-29 | Discharge: 2014-12-29 | Disposition: A | Discharge status: Home / Self Care | Payer: Medicaid Other | Source: Ambulatory Visit | Attending: Cardiology | Admitting: Cardiology

## 2014-12-29 DIAGNOSIS — I2510 Atherosclerotic heart disease of native coronary artery without angina pectoris: Secondary | ICD-10-CM | POA: Diagnosis present

## 2014-12-29 DIAGNOSIS — I10 Essential (primary) hypertension: Secondary | ICD-10-CM | POA: Diagnosis not present

## 2014-12-29 DIAGNOSIS — Z720 Tobacco use: Secondary | ICD-10-CM

## 2014-12-29 DIAGNOSIS — I255 Ischemic cardiomyopathy: Secondary | ICD-10-CM

## 2014-12-29 DIAGNOSIS — I5022 Chronic systolic (congestive) heart failure: Secondary | ICD-10-CM

## 2014-12-29 DIAGNOSIS — I2109 ST elevation (STEMI) myocardial infarction involving other coronary artery of anterior wall: Secondary | ICD-10-CM

## 2014-12-29 DIAGNOSIS — Z7902 Long term (current) use of antithrombotics/antiplatelets: Secondary | ICD-10-CM | POA: Diagnosis not present

## 2014-12-29 DIAGNOSIS — Z95810 Presence of automatic (implantable) cardiac defibrillator: Secondary | ICD-10-CM

## 2014-12-29 DIAGNOSIS — Z87891 Personal history of nicotine dependence: Secondary | ICD-10-CM | POA: Diagnosis not present

## 2014-12-29 DIAGNOSIS — I252 Old myocardial infarction: Secondary | ICD-10-CM | POA: Insufficient documentation

## 2014-12-29 HISTORY — DX: Presence of automatic (implantable) cardiac defibrillator: Z95.810

## 2014-12-29 MED ORDER — TECHNETIUM TC 99M SESTAMIBI GENERIC - CARDIOLITE: 31.7000 | Freq: Once | INTRAVENOUS | Status: AC | PRN

## 2014-12-29 MED ORDER — TECHNETIUM TC 99M SESTAMIBI GENERIC - CARDIOLITE: 10.7000 | Freq: Once | INTRAVENOUS | Status: AC | PRN

## 2014-12-29 NOTE — Progress Notes (Addendum)
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 Doran Durand. Dr. Nona Dell office had notified Dr. Doug Sou office of plans for surgery back ~ 08/2014, but Dr. Martinique recommended surgery be postponed until at least 12/2014 due to history of DES on in 12/11/13.  PAT is scheduled for 12/29/14.  History includes former smoker, CAD s/p RCA stent '00, STEMI with occlusion of proximal RCA (felt to be chronic) with collaterals and proximal LAD occlusion s/p LAD DES 12/11/13, ischemic cardiomyopathy, chronic systolic CHF, s/p Biotronik ICD 04/17/14, HLD, HTN.  PCP is listed as Dr. Velna Hatchet.  Primary cardiologist is Dr. Martinique, last seen on 12/18/14 for routine follow-up with recent chest pain and dizziness/pre-syncope, and stress test was ordered.  EP cardiologist is Dr. [name].    Meds include allopurinol, ASA, Coreg, Plavix (changed from [facility] at last cardiology visit), colchicine, Lasix, lisinopril, Nitro, pravastatin, spironolactone.  12/29/14 nuclear stress test: Overall Impression: High risk stress nuclear study with a large, severe intensity, mostly fixed anterior, septal, apical and inferoapical defect suggestive of scar. There is a small area of mid to distal inferoapical reversibillity (SDS 2), which is likely peri-infarct ischemia. There was 2 mm horizontal lateral  ST depression in v5-v6 at peak exercise with mild chest burning. LV Wall Motion: Anterior, septal and inferior hypokinesis to akinesis - lateral wall motion is preserved. LVEF 29%.  03/13/14 Echo: - Left ventricle: Global strain calculated at -8% which is probably inaccurate. The LV walls do not appear to be accurately tracking The cavity size was mildly dilated. There was moderate concentric hypertrophy. Systolic function was moderately to severely  reduced. The estimated ejection fraction was in the range of 30% to 35%. There is akinesis of the mid-distalanteroseptal myocardium. There was an increased relative contribution of atrial contraction to ventricular filling. Doppler parameters are consistent with abnormal left ventricular relaxation (grade 1 diastolic dysfunction). - Aortic valve: Moderate thickening and calcification, consistent with sclerosis. - Aorta: Ascending aortic diameter: 43mm (S). - Ascending aorta: The ascending aorta was mildly dilated. - Left atrium: The atrium was moderately dilated. (EF 30% by prior echo on 12/12/13.)  12/11/13 cardiac cath: -Coronary dominance: right -Left mainstem: The left main is large with bulky eccentric 50% stenosis distally.  -Left anterior descending (LAD): 100% occluded proximally. Moderate calcification. -Ramus intermediate: large bifurcating vessel. Normal.  -Left circumflex (LCx): Small, normal.  -Right coronary artery (RCA): 100% occluded proximally with left to right and right to right collaterals. -Left ventriculography: Left ventricular systolic function is abnormal, There is severe hypokinesis of the anterior wall, apex, and distal inferior wall. LVEF is estimated at 35%, there is no significant mitral regurgitation  Final Conclusions: 1. Severe 2 vessel occlusive CAD. Culprit is occlusion of the proximal LAD. Chronic occlusion of the RCA with collaterals. 2. Moderate to severe LV dysfunction.  3. Successful stenting of the proximal and mid LAD with DES. Recommendations: Dual antiplatelet therapy for at least one year.   06/12/14 EKG: NSR, anterior infarct (age undetermined). 12/29/14 EKG (in Muse from strips during stress test): NSR, anterior infarct (old), inferior T wave abnormality, consider ischemia, non-specific lateral T wave abnormality, rightward axis. Inferior T wave abnormality new/more prominent when compared to 06/2014 EKGs.  06/10/14 CXR: No active  cardiopulmonary disease.  Patient reported that labs were drawn at [facility] earlier today (ordered by  Dr. Martinique).  Results are still pending.   I spoke with patient during his PAT visit.  He denied further chest pain, but had another dizzy spell over the weekend. No syncope. His weight/edema had been up prior to his appointment with Dr. Martinique, but are now back to baseline.  No SOB.  No ICD discharges.  His ICD is located on the right chest.  Heart RRR, no murmur noted.  Lungs clear. No pre-tibial edema. We dicussed that Dr. Martinique would have to review his stress test results to determine if further testing was needed prior to surgery.  Dr. Martinique would also need to discuss when and if he could stop Plavix and/or ASA.  Abigail Butts at Dr. Nona Dell office awaiting input from Dr. Martinique.  Patient is aware that his surgery may ultimately be postponed, but we will await further cardiology input and lab results.  George Hugh [facility] Short Stay Center/Anesthesiology Phone [phone] 12/29/2014 4:13 PM  Addendum: Labs results from yesterday at [facility] are still pending (message left with [facility]). I spoke with Abigail Butts at Dr. Nona Dell office stating that Dr. Martinique is okay with patient having surgery tomorrow but should stay on Plavix.  He is suppose to fax them a letter, but wrote in [place], "Stress Myoview is high risk. There is a large area of anterior infarct as well as inferoapical ischemia. He has symptoms and Ecg changes. I thing we should consider repeat cardiac cath. He may need to be considered for CTO PCI of the RCA. He should not stop Plavix for minor foot surgery."  Cardiac cath is scheduled for 01/05/15. I reviewed with anesthesiologist Dr. Deatra Canter who felt that if cardiac cath is planned and surgery is considered elective that the safest thing to do was to postpone surgery until after cardiac cath.  Rescheduling date would depend on if any intervention was required.  Abigail Butts notified. I offered  to have Dr. Doran Durand speak with Dr. Ola Spurr if needed.  She plans to cancel surgery for tomorrow and notify the patient.    George Hugh [facility] Short Stay Center/Anesthesiology Phone [phone] 12/30/2014 12:07 PM

## 2014-12-29 NOTE — Pre-Procedure Instructions (Signed)
Jerman Leaf  12/29/2014   Your procedure is scheduled on:  12-31-2014   Thursday   Report to [facility] Admitting at 8:30 AM.   Call this number if you have problems the morning of surgery: [phone]   Remember:   Do not eat food or drink liquids after midnight.    Take these medicines the morning of surgery with A SIP OF WATER: allopurinol,carvedilol(coreg),   Do not wear jewelry,   Do not wear lotions, powders, or perfumes. You may not wear deodorant.   Do not shave 48 hours prior to surgery. Men may shave face and neck.  Do not bring valuables to the hospital.  [facility] is not responsible  for any belongings or valuables.               Contacts, dentures or bridgework may not be worn into surgery.   Leave suitcase in the car. After surgery it may be brought to your room.  For patients admitted to the hospital, discharge time is determined by your  treatment team.               Patients discharged the day of surgery will not be allowed to drive home.   Name and phone number of your driver:     Special Instructions: See attached sheet for instructions on CHG shower/bath    Please read over the following fact sheets that you were given: Pain Booklet, Coughing and Deep Breathing and Surgical Site Infection Prevention

## 2014-12-29 NOTE — Progress Notes (Addendum)
Waiting for  Results of Stress Test and input from Dr. Neita Garnet.  Pt. Had labs drawn at [facility]  Today waiting for results.

## 2014-12-29 NOTE — Procedures (Addendum)
[facility] [address] [place] [address] [phone]  Cardiology Nuclear Med Study  Kerry Mason is a 59 y.o. male     MRN : [id number]     DOB: [birth year]  Procedure Date: 12/29/2014  Nuclear Med Background Indication for Stress Test:  Follow up CAD;Symptomatic History:  MI-12/11/2013;CHF;Ischemic Cardiomyopathy;CAD;;PTCA;AICD-04/17/2014 Cardiac Risk Factors: History of Smoking, Hypertension, Lipids, Overweight   Symptoms:  Chest Pain, Dizziness, DOE, Fatigue and SOB   Nuclear Pre-Procedure Caffeine/Decaff Intake:  7:00pm NPO After: 5:00am   IV Site: R Forearm  IV 0.9% NS with Angio Cath:  22g  Chest Size (in):  46" IV Started by: Rolene Course, RN  Height: 6\' 1"  (1.854 m)  Cup Size: n/a  BMI:  Body mass index is 27.58 kg/(m^2). Weight:  209 lb (94.802 kg)   Tech Comments:  n/a    Nuclear Med Study 1 or 2 day study: 1 day  Stress Test Type:  Stress  Order Authorizing Provider:  Peter Martinique, MD   Resting Radionuclide: Technetium 23m Sestamibi  Resting Radionuclide Dose: 10.7 mCi   Stress Radionuclide:  Technetium 31m Sestamibi  Stress Radionuclide Dose: 31.7 mCi           Stress Protocol Rest HR: 68 Stress HR: 139  Rest BP: 110/76 Stress BP: 175/101  Exercise Time (min): 5:24 METS: 7.0   Predicted Max HR: 162 bpm % Max HR: 85.8 bpm Rate Pressure Product: 24325  Dose of Adenosine (mg):  n/a Dose of Lexiscan: n/a mg  Dose of Atropine (mg): n/a Dose of Dobutamine: n/a mcg/kg/min (at max HR)  Stress Test Technologist: Leane Para, CCT Nuclear Technologist: Imagene Riches, CNMT   Rest Procedure:  Myocardial perfusion imaging was performed at rest 45 minutes following the intravenous administration of Technetium 10m Sestamibi. Stress Procedure:  The patient performed treadmill exercise using a Bruce  Protocol for 5:24 minutes. The patient stopped due to SOB, Fatigue, Leg Fatigue and Chest Burning, 2.0 mm  ST depression  and denied any chest pain.  There were  significant ST-T wave changes.  Technetium 39m Sestamibi was injected IV at peak exercise and myocardial perfusion imaging was performed after a brief delay.  Transient Ischemic Dilatation (Normal <1.22):  1.13  QGS EDV:  209 ml QGS ESV:  149 ml LV Ejection Fraction: 29%  Rest ECG: NSR with Q waves in V1-V4  Stress ECG: Significant ST abnormalities consistent with ischemia. and There are scattered PVCs.  QPS Raw Data Images:  Normal; no motion artifact; normal heart/lung ratio. Stress Images:  Large fixed anterior, apical, septal and inferior perfusion defect Rest Images:  Large fixed anterior, apical, septal and inferior perfusion defect Subtraction (SDS):  2  Impression Exercise Capacity:  Fair exercise capacity. BP Response:  Hypertensive blood pressure response. Clinical Symptoms:  Mild chest pain/dyspnea. ECG Impression:  Significant ST abnormalities consistent with ischemia. Comparison with Prior Nuclear Study: No images to compare  Overall Impression:  High risk stress nuclear study with a large, severe intensity, mostly fixed anterior, septal, apical and inferoapical defect suggestive of scar. There is a small area of mid to distal inferoapical reversibillity (SDS 2), which is likely peri-infarct ischemia. There was 2 mm horizontal lateral ST depression in v5-v6 at peak exercise with mild chest burning..  LV Wall Motion:  Anterior, septal and inferior hypokinesis to akinesis - lateral wall motion is preserved. LVEF 29%.  Pixie Casino, MD, [facility] Board Certified in Nuclear Cardiology Attending Cardiologist  [place], MD  12/29/2014 12:28 PM

## 2014-12-29 NOTE — Progress Notes (Signed)
This pt. Scored at an elevated risk for obstructive sleep apnea using the STOP BANG TOOL during a pre-surgical visit. A score of 4 or greater is considered an elevated risk.

## 2014-12-30 ENCOUNTER — Other Ambulatory Visit: Payer: Self-pay

## 2014-12-30 ENCOUNTER — Other Ambulatory Visit: Payer: Self-pay | Admitting: Cardiology

## 2014-12-30 DIAGNOSIS — R9439 Abnormal result of other cardiovascular function study: Secondary | ICD-10-CM

## 2014-12-30 DIAGNOSIS — I2109 ST elevation (STEMI) myocardial infarction involving other coronary artery of anterior wall: Secondary | ICD-10-CM

## 2014-12-30 LAB — CBC WITH DIFFERENTIAL/PLATELET
BASOS ABS: 0.1 10*3/uL (ref 0.0–0.1)
BASOS PCT: 1 % (ref 0–1)
EOS ABS: 0.3 10*3/uL (ref 0.0–0.7)
Eosinophils Relative: 3 % (ref 0–5)
HCT: 46.2 % (ref 39.0–52.0)
Hemoglobin: 15.9 g/dL (ref 13.0–17.0)
LYMPHS ABS: 4 10*3/uL (ref 0.7–4.0)
Lymphocytes Relative: 38 % (ref 12–46)
MCH: 30.6 pg (ref 26.0–34.0)
MCHC: 34.4 g/dL (ref 30.0–36.0)
MCV: 88.8 fL (ref 78.0–100.0)
MONO ABS: 0.7 10*3/uL (ref 0.1–1.0)
MONOS PCT: 7 % (ref 3–12)
MPV: 8.4 fL — ABNORMAL LOW (ref 8.6–12.4)
Neutro Abs: 5.4 10*3/uL (ref 1.7–7.7)
Neutrophils Relative %: 51 % (ref 43–77)
PLATELETS: 284 10*3/uL (ref 150–400)
RBC: 5.2 MIL/uL (ref 4.22–5.81)
RDW: 13.4 % (ref 11.5–15.5)
WBC: 10.6 10*3/uL — AB (ref 4.0–10.5)

## 2014-12-30 LAB — HEPATIC FUNCTION PANEL
ALK PHOS: 46 U/L (ref 39–117)
ALT: 10 U/L (ref 0–53)
AST: 16 U/L (ref 0–37)
Albumin: 4.2 g/dL (ref 3.5–5.2)
BILIRUBIN INDIRECT: 0.5 mg/dL (ref 0.2–1.2)
BILIRUBIN TOTAL: 0.6 mg/dL (ref 0.2–1.2)
Bilirubin, Direct: 0.1 mg/dL (ref 0.0–0.3)
TOTAL PROTEIN: 7.8 g/dL (ref 6.0–8.3)

## 2014-12-30 LAB — BASIC METABOLIC PANEL
BUN: 15 mg/dL (ref 6–23)
CO2: 24 meq/L (ref 19–32)
Calcium: 9.6 mg/dL (ref 8.4–10.5)
Chloride: 103 mEq/L (ref 96–112)
Creat: 0.78 mg/dL (ref 0.50–1.35)
Glucose, Bld: 75 mg/dL (ref 70–99)
Potassium: 4.8 mEq/L (ref 3.5–5.3)
SODIUM: 134 meq/L — AB (ref 135–145)

## 2014-12-30 LAB — LIPID PANEL
Cholesterol: 132 mg/dL (ref 0–200)
HDL: 36 mg/dL — ABNORMAL LOW (ref >39)
LDL CALC: 73 mg/dL (ref 0–99)
Total CHOL/HDL Ratio: 3.7 Ratio
Triglycerides: 115 mg/dL (ref <150)
VLDL: 23 mg/dL (ref 0–40)

## 2014-12-30 LAB — BRAIN NATRIURETIC PEPTIDE: BRAIN NATRIURETIC PEPTIDE: 57.6 pg/mL (ref 0.0–100.0)

## 2014-12-31 ENCOUNTER — Ambulatory Visit (HOSPITAL_COMMUNITY): Admission: RE | Admit: 2014-12-31 | Payer: Medicaid Other | Source: Ambulatory Visit | Admitting: Orthopedic Surgery

## 2014-12-31 ENCOUNTER — Encounter (HOSPITAL_COMMUNITY): Admission: RE | Payer: Self-pay | Source: Ambulatory Visit

## 2014-12-31 SURGERY — AMPUTATION, FOOT, PARTIAL
Anesthesia: General | Site: Foot | Laterality: Right

## 2015-01-05 ENCOUNTER — Encounter (HOSPITAL_COMMUNITY)
Admission: RE | Disposition: A | Discharge status: Home / Self Care | Payer: Self-pay | Source: Ambulatory Visit | Attending: Cardiology

## 2015-01-05 ENCOUNTER — Ambulatory Visit (HOSPITAL_COMMUNITY)
Admission: RE | Admit: 2015-01-05 | Discharge: 2015-01-05 | Disposition: A | Discharge status: Home / Self Care | Payer: Medicaid Other | Source: Ambulatory Visit | Attending: Cardiology | Admitting: Cardiology

## 2015-01-05 ENCOUNTER — Encounter (HOSPITAL_COMMUNITY): Payer: Self-pay | Admitting: Cardiology

## 2015-01-05 DIAGNOSIS — Z7982 Long term (current) use of aspirin: Secondary | ICD-10-CM | POA: Insufficient documentation

## 2015-01-05 DIAGNOSIS — R9439 Abnormal result of other cardiovascular function study: Secondary | ICD-10-CM

## 2015-01-05 DIAGNOSIS — E785 Hyperlipidemia, unspecified: Secondary | ICD-10-CM | POA: Diagnosis not present

## 2015-01-05 DIAGNOSIS — I2510 Atherosclerotic heart disease of native coronary artery without angina pectoris: Secondary | ICD-10-CM | POA: Diagnosis present

## 2015-01-05 DIAGNOSIS — Z79899 Other long term (current) drug therapy: Secondary | ICD-10-CM | POA: Diagnosis not present

## 2015-01-05 DIAGNOSIS — I5022 Chronic systolic (congestive) heart failure: Secondary | ICD-10-CM | POA: Diagnosis present

## 2015-01-05 DIAGNOSIS — I252 Old myocardial infarction: Secondary | ICD-10-CM | POA: Insufficient documentation

## 2015-01-05 DIAGNOSIS — I255 Ischemic cardiomyopathy: Secondary | ICD-10-CM | POA: Diagnosis not present

## 2015-01-05 DIAGNOSIS — Z6825 Body mass index (BMI) 25.0-25.9, adult: Secondary | ICD-10-CM | POA: Diagnosis not present

## 2015-01-05 DIAGNOSIS — E669 Obesity, unspecified: Secondary | ICD-10-CM | POA: Diagnosis not present

## 2015-01-05 DIAGNOSIS — Z87891 Personal history of nicotine dependence: Secondary | ICD-10-CM | POA: Diagnosis not present

## 2015-01-05 DIAGNOSIS — M109 Gout, unspecified: Secondary | ICD-10-CM | POA: Insufficient documentation

## 2015-01-05 DIAGNOSIS — R002 Palpitations: Secondary | ICD-10-CM | POA: Insufficient documentation

## 2015-01-05 DIAGNOSIS — I25118 Atherosclerotic heart disease of native coronary artery with other forms of angina pectoris: Secondary | ICD-10-CM

## 2015-01-05 DIAGNOSIS — I10 Essential (primary) hypertension: Secondary | ICD-10-CM | POA: Diagnosis not present

## 2015-01-05 HISTORY — PX: LEFT HEART CATHETERIZATION WITH CORONARY ANGIOGRAM: SHX5451

## 2015-01-05 LAB — PROTIME-INR
INR: 1.04 (ref 0.00–1.49)
Prothrombin Time: 13.7 seconds (ref 11.6–15.2)

## 2015-01-05 LAB — BASIC METABOLIC PANEL
ANION GAP: 4 — AB (ref 5–15)
BUN: 15 mg/dL (ref 6–23)
CALCIUM: 9.2 mg/dL (ref 8.4–10.5)
CO2: 26 mmol/L (ref 19–32)
CREATININE: 0.82 mg/dL (ref 0.50–1.35)
Chloride: 107 mmol/L (ref 96–112)
GFR calc Af Amer: >90 mL/min (ref >90)
GLUCOSE: 86 mg/dL (ref 70–99)
Potassium: 5.7 mmol/L — ABNORMAL HIGH (ref 3.5–5.1)
SODIUM: 137 mmol/L (ref 135–145)

## 2015-01-05 LAB — POTASSIUM: Potassium: 4.4 mmol/L (ref 3.5–5.1)

## 2015-01-05 SURGERY — LEFT HEART CATHETERIZATION WITH CORONARY ANGIOGRAM

## 2015-01-05 MED ORDER — SODIUM CHLORIDE 0.9 % IJ SOLN: 3.0000 mL | Freq: Two times a day (BID) | INTRAMUSCULAR | Status: DC

## 2015-01-05 MED ORDER — ASPIRIN 81 MG PO CHEW: 81.0000 mg | CHEWABLE_TABLET | ORAL | Status: DC

## 2015-01-05 MED ORDER — HEPARIN SODIUM (PORCINE) 1000 UNIT/ML IJ SOLN
INTRAMUSCULAR | Status: AC
  Filled 2015-01-05: qty 1

## 2015-01-05 MED ORDER — VERAPAMIL HCL 2.5 MG/ML IV SOLN
INTRAVENOUS | Status: AC
  Filled 2015-01-05: qty 2

## 2015-01-05 MED ORDER — SODIUM CHLORIDE 0.9 % IV SOLN: 1.0000 mL/kg/h | INTRAVENOUS | Status: DC

## 2015-01-05 MED ORDER — HEPARIN (PORCINE) IN NACL 2-0.9 UNIT/ML-% IJ SOLN
INTRAMUSCULAR | Status: AC
  Filled 2015-01-05: qty 1500

## 2015-01-05 MED ORDER — SODIUM CHLORIDE 0.9 % IJ SOLN: 3.0000 mL | INTRAMUSCULAR | Status: DC | PRN

## 2015-01-05 MED ORDER — FENTANYL CITRATE 0.05 MG/ML IJ SOLN
INTRAMUSCULAR | Status: AC
  Filled 2015-01-05: qty 2

## 2015-01-05 MED ORDER — SODIUM CHLORIDE 0.9 % IV SOLN
INTRAVENOUS | Status: DC
  Administered 2015-01-05: 10:00:00 via INTRAVENOUS

## 2015-01-05 MED ORDER — LIDOCAINE HCL (PF) 1 % IJ SOLN
INTRAMUSCULAR | Status: AC
  Filled 2015-01-05: qty 30

## 2015-01-05 MED ORDER — MIDAZOLAM HCL 2 MG/2ML IJ SOLN
INTRAMUSCULAR | Status: AC
  Filled 2015-01-05: qty 2

## 2015-01-05 MED ORDER — SODIUM CHLORIDE 0.9 % IV SOLN: 250.0000 mL | INTRAVENOUS | Status: DC | PRN

## 2015-01-05 NOTE — H&P (View-Only) (Signed)
Kerry Mason Date of Birth: [birth year] Medical Record #[id number]  History of Present Illness: [name] is seen back today for follow up of CAD and CHF.  He has known CAD with remote stenting of the RCA in 2000 and then lost to follow up. Had STEMI in January of 2015 - with cath showing occlusion of the proximal RCA with collaterals and occlusion of the proximal LAD. RCA occlusion felt to be chronic. EF 35% by cath and 30% by echo. Proximal and mid to distal LAD were stented with DES. This was associated with acute renal insufficiency that resolved. He  had a subsequent ICD implanted. Other issues include tobacco abuse- now stopped, gout, HLD, and HTN. On follow up today he reports he has not done well since Thanksgiving. He states he had PNA and was treated with a Zpak. He had a mix up in his medication and was taking more Coreg than prescribed. He switched from Plavix to Brilinta. He has noted a lot of fluctuation of his weight. He has some spells of SOB and acute sweats. He has some pain between his shoulder blades similar to his MI pain. He has some dizziness.    Current Outpatient Prescriptions  Medication Sig Dispense Refill  . allopurinol (ZYLOPRIM) 300 MG tablet Take 1 tablet (300 mg total) by mouth daily. 90 tablet 3  . aspirin EC 81 MG tablet Take 81 mg by mouth daily.    . carvedilol (COREG) 12.5 MG tablet Take 1 tablet (12.5 mg total) by mouth 2 (two) times daily. 60 tablet 6  . colchicine (COLCRYS) 0.6 MG tablet Take 1 tablet (0.6 mg total) by mouth daily as needed (gout flare ups). 30 tablet 6  . Fluocinonide 0.1 % CREA Apply 1 application topically 3 (three) times daily as needed (rash - on legs, hands and bottom).    . furosemide (LASIX) 20 MG tablet Take 1 tablet (20 mg total) by mouth daily. To take "as needed" for weight gain of 2 to 3 pounds overnight 90 tablet 3  . lisinopril (PRINIVIL,ZESTRIL) 20 MG tablet Take 1 tablet (20 mg total) by mouth daily. 90 tablet 3  .  Multiple Vitamin (MULTIVITAMIN WITH MINERALS) TABS tablet Take 1 tablet by mouth daily.    . nitroGLYCERIN (NITROSTAT) 0.4 MG SL tablet Place 1 tablet (0.4 mg total) under the tongue every 5 (five) minutes as needed for chest pain. 25 tablet 11  . pravastatin (PRAVACHOL) 40 MG tablet Take 1 tablet (40 mg total) by mouth at bedtime. 90 tablet 3  . spironolactone (ALDACTONE) 25 MG tablet Take 1 tablet (25 mg total) by mouth at bedtime. 90 tablet 3  . triamcinolone cream (KENALOG) 0.1 % Apply 1 application topically 5 (five) times daily as needed (rash on legs).    . clopidogrel (PLAVIX) 75 MG tablet Take 1 tablet (75 mg total) by mouth daily. 90 tablet 3   No current facility-administered medications for this visit.    No Known Allergies  Past Medical History  Diagnosis Date  . CAD (coronary artery disease)     a. 2000: s/p stent of RCA 2000 with BMS  . Tobacco abuse   . Obesity   . ST elevation myocardial infarction (STEMI) of anterior wall, subsequent episode of care   . HTN (hypertension)   . Hyperlipidemia   . Acute systolic CHF (congestive heart failure)   . Ischemic cardiomyopathy     Past Surgical History  Procedure Laterality Date  . Cardiac catheterization    .  Coronary angioplasty  2000  . Coronary angioplasty with stent placement  2015  . Left heart catheterization with coronary angiogram N/A 12/11/2013    Procedure: LEFT HEART CATHETERIZATION WITH CORONARY ANGIOGRAM;  Surgeon: Toryn Mcclinton M Martinique, MD;  Location: [facility] CATH LAB;  Service: Cardiovascular;  Laterality: N/A;  . Percutaneous coronary stent intervention (pci-s)  12/11/2013    Procedure: PERCUTANEOUS CORONARY STENT INTERVENTION (PCI-S);  Surgeon: Caidan Hubbert M Martinique, MD;  Location: [facility] CATH LAB;  Service: Cardiovascular;;  prov LAD and mid LAD  . Implantable cardioverter defibrillator implant N/A 04/17/2014    Procedure: IMPLANTABLE CARDIOVERTER DEFIBRILLATOR IMPLANT;  Surgeon: Evans Lance, MD;  Location: [facility] CATH LAB;  Service:  Cardiovascular;  Laterality: N/A;    History  Smoking status  . Former Smoker -- 1.00 packs/day for 35 years  . Types: Cigarettes  . Quit date: 12/11/2013  Smokeless tobacco  . Not on file    History  Alcohol Use No    Family History  Problem Relation Age of Onset  . CAD Father     PTCA    Review of Systems: The review of systems is per the HPI.  All other systems were reviewed and are negative.  Physical Exam: BP 130/81 mmHg  Pulse 80  Ht 6\' 1"  (1.854 m)  Wt 209 lb (94.802 kg)  BMI 27.58 kg/m2 Patient is very pleasant and in no acute distress. Skin is warm and dry. Color is normal.  HEENT is unremarkable. Normocephalic/atraumatic. PERRL. Sclera are nonicteric. Neck is supple. No masses. No JVD. ICD site has healed well. Lungs are clear. Cardiac exam shows a regular rate and rhythm. normal S1-2. No gallop or murmur. Abdomen is soft. Extremities are without edema. Gait and ROM are intact. No gross neurologic deficits noted.  Wt Readings from Last 3 Encounters:  12/18/14 209 lb (94.802 kg)  08/14/14 214 lb 12.8 oz (97.433 kg)  07/21/14 216 lb (97.977 kg)    LABORATORY DATA/PROCEDURES:  Lab Results  Component Value Date   WBC 8.6 06/10/2014   HGB 14.6 06/10/2014   HCT 42.1 06/10/2014   PLT 256 06/10/2014   GLUCOSE 107* 06/10/2014   CHOL 123 01/26/2014   TRIG 141.0 01/26/2014   HDL 22.20* 01/26/2014   LDLCALC 73 01/26/2014   ALT 15 01/26/2014   AST 17 01/26/2014   NA 135* 06/10/2014   K 4.7 06/10/2014   CL 100 06/10/2014   CREATININE 0.66 06/10/2014   BUN 13 06/10/2014   CO2 19 06/10/2014   TSH 1.265 12/11/2013   INR 0.92 12/11/2013   HGBA1C 5.8* 12/11/2013    BNP (last 3 results)  Recent Labs  06/10/14 1349  PROBNP 474.9*   Lab Results  Component Value Date   TROPONINI <0.30 06/10/2014      Assessment / Plan: 1. CAD s/p anterior STEMI with extensive LAD stenting. Old occlusion of RCA. Recent atypical chest pain symptoms similar to prior  infarct pain. I have recommended a stress Myoview study to evaluate further. Given the extensive nature of stenting of the LAD I would recommend long term DAPT but with cost I would just take ASA and Plavix 75 mg daily.   2. Chronic Systolic heart failure -EF 30-35% by Echo in April 2015.  Will continue current Coreg, lisinopril, and aldactone Rx. Recommend he take lasix 20 mg daily. Will reassess EF with Myoview. Check BNP level.    3. Underlying ICD - palpitations - continue beta blocker. No significant arrhythmia noted on ICD interrogation in  Nov.  4. Hyperlipidemia. Will check fasting chemistries and lipids on Pravachol.   5. Tobacco abuse- now quit.   I will follow up in 6 months. Very encouraged with his lifestyle modifications.

## 2015-01-05 NOTE — Discharge Instructions (Signed)
Radial Site Care °Refer to this sheet in the next few weeks. These instructions provide you with information on caring for yourself after your procedure. Your caregiver may also give you more specific instructions. Your treatment has been planned according to current medical practices, but problems sometimes occur. Call your caregiver if you have any problems or questions after your procedure. °HOME CARE INSTRUCTIONS °· You may shower the day after the procedure. Remove the bandage (dressing) and gently wash the site with plain soap and water. Gently pat the site dry. °· Do not apply powder or lotion to the site. °· Do not submerge the affected site in water for 3 to 5 days. °· Inspect the site at least twice daily. °· Do not flex or bend the affected arm for 24 hours. °· No lifting over 5 pounds (2.3 kg) for 5 days after your procedure. °· Do not drive home if you are discharged the same day of the procedure. Have someone else drive you. °· You may drive 24 hours after the procedure unless otherwise instructed by your caregiver. °· Do not operate machinery or power tools for 24 hours. °· A responsible adult should be with you for the first 24 hours after you arrive home. °What to expect: °· Any bruising will usually fade within 1 to 2 weeks. °· Blood that collects in the tissue (hematoma) may be painful to the touch. It should usually decrease in size and tenderness within 1 to 2 weeks. °SEEK IMMEDIATE MEDICAL CARE IF: °· You have unusual pain at the radial site. °· You have redness, warmth, swelling, or pain at the radial site. °· You have drainage (other than a small amount of blood on the dressing). °· You have chills. °· You have a fever or persistent symptoms for more than 72 hours. °· You have a fever and your symptoms suddenly get worse. °· Your arm becomes pale, cool, tingly, or numb. °· You have heavy bleeding from the site. Hold pressure on the site. °Document Released: 12/23/2010 Document Revised:  02/12/2012 Document Reviewed: 12/23/2010 °ExitCare® Patient Information ©2015 [employer]. This information is not intended to replace advice given to you by your health care provider. Make sure you discuss any questions you have with your health care provider. ° °

## 2015-01-05 NOTE — Progress Notes (Signed)
TR BAND REMOVAL  LOCATION:    right radial  DEFLATED PER PROTOCOL:    Yes.    TIME BAND OFF / DRESSING APPLIED:    1715   SITE UPON ARRIVAL:    Level 0  SITE AFTER BAND REMOVAL:    Level 0  CIRCULATION SENSATION AND MOVEMENT:    Within Normal Limits   Yes.    COMMENTS:   TRB REMOVED/ TEGADERM DSG APPLIED

## 2015-01-05 NOTE — CV Procedure (Signed)
    Cardiac Catheterization Procedure Note  Name: Robyn Nohr MRN: [id number] DOB: 05-24-1956  Procedure: Left Heart Cath, Selective Coronary Angiography, LV angiography  Indication: 59 yo WM with history of anterior MI s/p stent of proximal and mid LAD presents with angina pectoris and high risk Myoview study   Procedural Details: The right wrist was prepped, draped, and anesthetized with 1% lidocaine. Using the modified Seldinger technique, a 6 French slender sheath was introduced into the right radial artery. 3 mg of verapamil was administered through the sheath, weight-based unfractionated heparin was administered intravenously. Standard Judkins catheters were used for selective coronary angiography and left ventriculography. Catheter exchanges were performed over an exchange length guidewire. There were no immediate procedural complications. A TR band was used for radial hemostasis at the completion of the procedure.  The patient was transferred to the post catheterization recovery area for further monitoring.  Procedural Findings: Hemodynamics: AO 104/56 mean 76 mm Hg LV 103/11 mm Hg  Coronary angiography: Coronary dominance: right  Left mainstem: 30% distal Left main stenosis.  Left anterior descending (LAD): The LAD is a very large vessel. The stents in the proximal and mid LAD are widely patent. The first diagonal is a modest size vessel with an 80% stenosis.   Left circumflex (LCx): The LCx gives off a single large OM branch then terminates in the AV groove. It has no significant disease.   Right coronary artery (RCA): The RCA is a large dominant vessel. It is occluded proximally. There is a stent in the RCA distal to the occlusion. There are good left to right collaterals and faint right to right collaterals.   Left ventriculography: Left ventricular systolic function is abnormal, LVEF is estimated at 30%. There is akinesis of the mid to distal anterior wall, apex, and  inferoapex. There is mild mitral regurgitation   Final Conclusions:   1. 2 vessel obstructive CAD. There is chronic total occlusion of the RCA with collaterals. The diagonal has moderate to severe disease. The LAD stents are widely patent. 2. Severe LV dysfunction with anteroapical wall motion abnormality. The basal to mid inferior wall contract normally.   Recommendations: Continue current medical therapy. I think he would benefit most from CTO PCI of the RCA. The diagonal could also be considered but this is a modest sized vessel and PCI would have to pass through the LAD stent struts. Will discuss with patient in follow up.  Peter Martinique, [place]  01/05/2015, 3:20 PM

## 2015-01-05 NOTE — Interval H&P Note (Signed)
History and Physical Interval Note:  01/05/2015 2:21 PM  Kerry Mason  has presented today for surgery, with the diagnosis of adnormal mioview/cad  The various methods of treatment have been discussed with the patient and family. After consideration of risks, benefits and other options for treatment, the patient has consented to  Procedure(s): LEFT HEART CATHETERIZATION WITH CORONARY ANGIOGRAM (N/A) as a surgical intervention .  The patient's history has been reviewed, patient examined, no change in status, stable for surgery.  I have reviewed the patient's chart and labs.  Questions were answered to the patient's satisfaction.    Cath Lab Visit (complete for each Cath Lab visit)  Clinical Evaluation Leading to the Procedure:   ACS: No.  Non-ACS:    Anginal Classification: CCS II  Anti-ischemic medical therapy: Maximal Therapy (2 or more classes of medications)  Non-Invasive Test Results: High-risk stress test findings: cardiac mortality >3%/year  Prior CABG: No previous CABG       Kerry Mason [facility] 01/05/2015 2:21 PM

## 2015-01-11 ENCOUNTER — Telehealth: Payer: Self-pay | Admitting: Cardiology

## 2015-01-11 NOTE — Telephone Encounter (Signed)
Returned call to patient I will check with Dr.Jordan about scheduling CTO.Stated his chest pain is about the same.Stated sob is worse.I will call you back this afternoon.

## 2015-01-11 NOTE — Telephone Encounter (Signed)
New Message        Pt calling stating that he recently had a procedure done and was told that he would be hearing from Dr. Doug Sou nurse. Pt is just calling to f/u.

## 2015-01-12 NOTE — Telephone Encounter (Signed)
Returned call to patient 01/11/15.Spoke to [place] CTO scheduled for 02/03/15.Advised to keep appointment with Dr.Jordan 01/22/15 at 9:30 am.Instructions will be given at office visit.

## 2015-01-13 ENCOUNTER — Telehealth: Payer: Self-pay | Admitting: Cardiology

## 2015-01-13 NOTE — Telephone Encounter (Signed)
Received paperwork for FMLA for daughter Geraldo Pitter.  Left msg for her to return call regarding FMLA paperwork.  Need signed release and will be processed through [employer] so $25.00 charge will apply (check or money order only.)

## 2015-01-18 NOTE — Telephone Encounter (Signed)
Patient came in on 01/15/15 to sign release however did not bring check or Money Order  Advised C. Mancel Bale (medical records) that would bring check on Monday to process through [employer].

## 2015-01-19 ENCOUNTER — Other Ambulatory Visit: Payer: Self-pay

## 2015-01-19 DIAGNOSIS — R9439 Abnormal result of other cardiovascular function study: Secondary | ICD-10-CM

## 2015-01-19 DIAGNOSIS — I5022 Chronic systolic (congestive) heart failure: Secondary | ICD-10-CM

## 2015-01-20 ENCOUNTER — Ambulatory Visit (INDEPENDENT_AMBULATORY_CARE_PROVIDER_SITE_OTHER): Payer: Medicaid Other | Admitting: *Deleted

## 2015-01-20 DIAGNOSIS — I255 Ischemic cardiomyopathy: Secondary | ICD-10-CM

## 2015-01-20 NOTE — Progress Notes (Signed)
Remote ICD transmission.   

## 2015-01-21 LAB — MDC_IDC_ENUM_SESS_TYPE_REMOTE
HighPow Impedance: 79 Ohm
Implantable Pulse Generator Model: 383594
Implantable Pulse Generator Serial Number: 60800912
Lead Channel Impedance Value: 507 Ohm
Lead Channel Sensing Intrinsic Amplitude: 15 mV
Lead Channel Setting Pacing Amplitude: 3 V
Lead Channel Setting Pacing Pulse Width: 0.4 ms
MDC IDC SET LEADCHNL RV SENSING SENSITIVITY: 0.8 mV
MDC IDC SET ZONE DETECTION INTERVAL: 250 ms
MDC IDC SET ZONE DETECTION INTERVAL: 310 ms
MDC IDC STAT BRADY RV PERCENT PACED: 0 %

## 2015-01-22 ENCOUNTER — Other Ambulatory Visit: Payer: Self-pay | Admitting: Cardiology

## 2015-01-22 ENCOUNTER — Ambulatory Visit (INDEPENDENT_AMBULATORY_CARE_PROVIDER_SITE_OTHER): Payer: Medicaid Other | Admitting: Cardiology

## 2015-01-22 ENCOUNTER — Encounter: Payer: Self-pay | Admitting: Cardiology

## 2015-01-22 ENCOUNTER — Ambulatory Visit: Payer: BLUE CROSS/BLUE SHIELD | Admitting: Nurse Practitioner

## 2015-01-22 VITALS — BP 110/72 | HR 62 | Ht 73.0 in | Wt 209.0 lb

## 2015-01-22 DIAGNOSIS — I255 Ischemic cardiomyopathy: Secondary | ICD-10-CM

## 2015-01-22 DIAGNOSIS — R931 Abnormal findings on diagnostic imaging of heart and coronary circulation: Secondary | ICD-10-CM

## 2015-01-22 DIAGNOSIS — R9439 Abnormal result of other cardiovascular function study: Secondary | ICD-10-CM

## 2015-01-22 DIAGNOSIS — I25118 Atherosclerotic heart disease of native coronary artery with other forms of angina pectoris: Secondary | ICD-10-CM

## 2015-01-22 DIAGNOSIS — I10 Essential (primary) hypertension: Secondary | ICD-10-CM

## 2015-01-22 DIAGNOSIS — I5022 Chronic systolic (congestive) heart failure: Secondary | ICD-10-CM

## 2015-01-22 NOTE — Progress Notes (Signed)
Kerry Mason Date of Birth: 1956-09-25 Medical Record #[id number]  History of Present Illness: [name] is seen back today for follow up of CAD and CHF.  He has known CAD with remote stenting of the RCA in 2000 and then lost to follow up. Had STEMI in January of 2015 - with cath showing occlusion of the proximal RCA with collaterals and occlusion of the proximal LAD. RCA occlusion felt to be chronic. EF 35% by cath and 30% by echo. Proximal and mid to distal LAD were stented with DES. This was associated with acute renal insufficiency that resolved. He  had a subsequent ICD implanted. Other issues include tobacco abuse- now stopped, gout, HLD, and HTN. Last month he reported some increased SOB and chest pain. Myoview study was high risk with large area of anterior, septal and apical scar with inferoapical ischemia. EF 29%. He underwent repeat cardiac cath that showed the stents in the LAD were widely patent. The first diagonal had an 80% stenosis. The RCA was occluded proximally with left to right collaterals. He continues to complain of chest heaviness and burning at times. He feels exhausted at the end of the day.    Current Outpatient Prescriptions  Medication Sig Dispense Refill  . allopurinol (ZYLOPRIM) 300 MG tablet Take 1 tablet (300 mg total) by mouth daily. 90 tablet 3  . aspirin EC 81 MG tablet Take 81 mg by mouth daily.    . carvedilol (COREG) 12.5 MG tablet Take 1 tablet (12.5 mg total) by mouth 2 (two) times daily. 60 tablet 6  . clopidogrel (PLAVIX) 75 MG tablet Take 1 tablet (75 mg total) by mouth daily. 90 tablet 3  . colchicine (COLCRYS) 0.6 MG tablet Take 1 tablet (0.6 mg total) by mouth daily as needed (gout flare ups). 30 tablet 6  . Fluocinonide 0.1 % CREA Apply 1 application topically 3 (three) times daily as needed (rash - on legs, hands and bottom).    . furosemide (LASIX) 20 MG tablet Take 1 tablet (20 mg total) by mouth daily. To take "as needed" for weight gain of  2 to 3 pounds overnight (Patient taking differently: Take 20 mg by mouth daily. ) 90 tablet 3  . lisinopril (PRINIVIL,ZESTRIL) 20 MG tablet Take 1 tablet (20 mg total) by mouth daily. 90 tablet 3  . Multiple Vitamin (MULTIVITAMIN WITH MINERALS) TABS tablet Take 1 tablet by mouth daily.    . nitroGLYCERIN (NITROSTAT) 0.4 MG SL tablet Place 1 tablet (0.4 mg total) under the tongue every 5 (five) minutes as needed for chest pain. 25 tablet 11  . pravastatin (PRAVACHOL) 40 MG tablet Take 1 tablet (40 mg total) by mouth at bedtime. 90 tablet 3  . spironolactone (ALDACTONE) 25 MG tablet Take 1 tablet (25 mg total) by mouth at bedtime. 90 tablet 3  . triamcinolone cream (KENALOG) 0.1 % Apply 1 application topically 5 (five) times daily as needed (rash on legs).     No current facility-administered medications for this visit.    No Known Allergies  Past Medical History  Diagnosis Date  . CAD (coronary artery disease)     a. 2000: s/p stent of RCA 2000 with BMS  . Tobacco abuse   . Obesity   . ST elevation myocardial infarction (STEMI) of anterior wall, subsequent episode of care   . HTN (hypertension)   . Hyperlipidemia   . Acute systolic CHF (congestive heart failure)   . Ischemic cardiomyopathy   . AICD (automatic cardioverter/defibrillator)  present     Past Surgical History  Procedure Laterality Date  . Cardiac catheterization    . Coronary angioplasty  2000  . Coronary angioplasty with stent placement  2015  . Left heart catheterization with coronary angiogram N/A 12/11/2013    Procedure: LEFT HEART CATHETERIZATION WITH CORONARY ANGIOGRAM;  Surgeon: Darrah Dredge M Martinique, MD;  Location: [facility] CATH LAB;  Service: Cardiovascular;  Laterality: N/A;  . Percutaneous coronary stent intervention (pci-s)  12/11/2013    Procedure: PERCUTANEOUS CORONARY STENT INTERVENTION (PCI-S);  Surgeon: Prabhjot Maddux M Martinique, MD;  Location: [facility] CATH LAB;  Service: Cardiovascular;;  prov LAD and mid LAD  . Implantable  cardioverter defibrillator implant N/A 04/17/2014    Procedure: IMPLANTABLE CARDIOVERTER DEFIBRILLATOR IMPLANT;  Surgeon: Evans Lance, MD;  Location: [facility] CATH LAB;  Service: Cardiovascular;  Laterality: N/A;  . Left heart catheterization with coronary angiogram N/A 01/05/2015    Procedure: LEFT HEART CATHETERIZATION WITH CORONARY ANGIOGRAM;  Surgeon: Adler Alton M Martinique, MD;  Location: [facility] CATH LAB;  Service: Cardiovascular;  Laterality: N/A;    History  Smoking status  . Former Smoker -- 1.00 packs/day for 35 years  . Types: Cigarettes  . Quit date: 12/11/2013  Smokeless tobacco  . Not on file    History  Alcohol Use No    Family History  Problem Relation Age of Onset  . CAD Father     PTCA    Review of Systems: The review of systems is per the HPI.  All other systems were reviewed and are negative.  Physical Exam: BP 110/72 mmHg  Pulse 62  Ht 6\' 1"  (1.854 m)  Wt 209 lb (94.802 kg)  BMI 27.58 kg/m2 Patient is very pleasant and in no acute distress. Skin is warm and dry. Color is normal.  HEENT is unremarkable. Normocephalic/atraumatic. PERRL. Sclera are nonicteric. Neck is supple. No masses. No JVD. ICD site has healed well. Lungs are clear. Cardiac exam shows a regular rate and rhythm. normal S1-2. No gallop or murmur. Abdomen is soft. Extremities are without edema. Gait and ROM are intact. No gross neurologic deficits noted.  Wt Readings from Last 3 Encounters:  01/22/15 209 lb (94.802 kg)  12/29/14 207 lb 11.2 oz (94.212 kg)  12/18/14 209 lb (94.802 kg)    LABORATORY DATA/PROCEDURES:  Lab Results  Component Value Date   WBC 10.6* 12/29/2014   HGB 15.9 12/29/2014   HCT 46.2 12/29/2014   PLT 284 12/29/2014   GLUCOSE 86 01/05/2015   CHOL 132 12/29/2014   TRIG 115 12/29/2014   HDL 36* 12/29/2014   LDLCALC 73 12/29/2014   ALT 10 12/29/2014   AST 16 12/29/2014   NA 137 01/05/2015   K 4.4 01/05/2015   CL 107 01/05/2015   CREATININE 0.82 01/05/2015   BUN 15  01/05/2015   CO2 26 01/05/2015   TSH 1.265 12/11/2013   INR 1.04 01/05/2015   HGBA1C 5.8* 12/11/2013    BNP (last 3 results)  Recent Labs  06/10/14 1349  PROBNP 474.9*   Lab Results  Component Value Date   TROPONINI <0.30 06/10/2014      Assessment / Plan: 1. CAD s/p anterior STEMI with extensive LAD stenting. Old occlusion of RCA. Recent  chest pain symptoms similar to prior infarct pain. Myoview study high risk. LAD stents still patent. EF low at 30% but EDP only 11. I think his refractory symptoms may be related to his RCA occlusion. His inferior wall appears viable. I have recommended CTO PCI of  the RCA to try and improve his symptoms and potentially help with his LV dysfunction. The procedure and risks were reviewed including but not limited to death, myocardial infarction, stroke, arrythmias, bleeding, transfusion, emergency surgery, dye allergy, or renal dysfunction. The patient voices understanding and is agreeable to proceed. Will continue ASA and Plavix.  2. Chronic Systolic heart failure -EF 30%.  Will continue current Coreg, lisinopril, and aldactone Rx. Recommend he take lasix 20 mg daily.   3. Underlying ICD - palpitations - continue beta blocker. No significant arrhythmia noted on ICD interrogation in Nov.  4. Hyperlipidemia. Well controlled on Pravachol.   5. Tobacco abuse- now quit.

## 2015-01-29 ENCOUNTER — Encounter: Payer: Self-pay | Admitting: *Deleted

## 2015-01-30 LAB — BASIC METABOLIC PANEL
BUN: 24 mg/dL — AB (ref 6–23)
CO2: 25 mEq/L (ref 19–32)
Calcium: 9.6 mg/dL (ref 8.4–10.5)
Chloride: 102 mEq/L (ref 96–112)
Creat: 0.97 mg/dL (ref 0.50–1.35)
Glucose, Bld: 69 mg/dL — ABNORMAL LOW (ref 70–99)
Potassium: 5.3 mEq/L (ref 3.5–5.3)
Sodium: 134 mEq/L — ABNORMAL LOW (ref 135–145)

## 2015-01-30 LAB — CBC WITH DIFFERENTIAL/PLATELET
BASOS PCT: 1 % (ref 0–1)
Basophils Absolute: 0.1 10*3/uL (ref 0.0–0.1)
Eosinophils Absolute: 0.4 10*3/uL (ref 0.0–0.7)
Eosinophils Relative: 4 % (ref 0–5)
HCT: 47.7 % (ref 39.0–52.0)
Hemoglobin: 16 g/dL (ref 13.0–17.0)
Lymphocytes Relative: 46 % (ref 12–46)
Lymphs Abs: 4.7 10*3/uL — ABNORMAL HIGH (ref 0.7–4.0)
MCH: 29.7 pg (ref 26.0–34.0)
MCHC: 33.5 g/dL (ref 30.0–36.0)
MCV: 88.7 fL (ref 78.0–100.0)
MPV: 8.7 fL (ref 8.6–12.4)
Monocytes Absolute: 0.8 10*3/uL (ref 0.1–1.0)
Monocytes Relative: 8 % (ref 3–12)
Neutro Abs: 4.2 10*3/uL (ref 1.7–7.7)
Neutrophils Relative %: 41 % — ABNORMAL LOW (ref 43–77)
Platelets: 314 10*3/uL (ref 150–400)
RBC: 5.38 MIL/uL (ref 4.22–5.81)
RDW: 13.5 % (ref 11.5–15.5)
WBC: 10.2 10*3/uL (ref 4.0–10.5)

## 2015-01-30 LAB — PROTIME-INR
INR: 1.07 (ref <1.50)
Prothrombin Time: 13.9 seconds (ref 11.6–15.2)

## 2015-02-03 ENCOUNTER — Encounter (HOSPITAL_COMMUNITY)
Admission: RE | Disposition: A | Discharge status: Home / Self Care | Payer: Self-pay | Source: Ambulatory Visit | Attending: Cardiology

## 2015-02-03 ENCOUNTER — Inpatient Hospital Stay (HOSPITAL_COMMUNITY)
Admission: RE | Admit: 2015-02-03 | Discharge: 2015-02-08 | DRG: 247 | Disposition: A | Discharge status: Home / Self Care | Payer: Medicaid Other | Source: Ambulatory Visit | Attending: Cardiology | Admitting: Cardiology

## 2015-02-03 ENCOUNTER — Encounter (HOSPITAL_COMMUNITY): Payer: Self-pay | Admitting: Interventional Cardiology

## 2015-02-03 DIAGNOSIS — Z955 Presence of coronary angioplasty implant and graft: Secondary | ICD-10-CM

## 2015-02-03 DIAGNOSIS — I2510 Atherosclerotic heart disease of native coronary artery without angina pectoris: Secondary | ICD-10-CM

## 2015-02-03 DIAGNOSIS — R9439 Abnormal result of other cardiovascular function study: Secondary | ICD-10-CM

## 2015-02-03 DIAGNOSIS — I3139 Other pericardial effusion (noninflammatory): Secondary | ICD-10-CM | POA: Insufficient documentation

## 2015-02-03 DIAGNOSIS — Z6826 Body mass index (BMI) 26.0-26.9, adult: Secondary | ICD-10-CM

## 2015-02-03 DIAGNOSIS — Z7982 Long term (current) use of aspirin: Secondary | ICD-10-CM

## 2015-02-03 DIAGNOSIS — I10 Essential (primary) hypertension: Secondary | ICD-10-CM | POA: Diagnosis present

## 2015-02-03 DIAGNOSIS — Z9861 Coronary angioplasty status: Secondary | ICD-10-CM

## 2015-02-03 DIAGNOSIS — E669 Obesity, unspecified: Secondary | ICD-10-CM | POA: Diagnosis present

## 2015-02-03 DIAGNOSIS — I9751 Accidental puncture and laceration of a circulatory system organ or structure during a circulatory system procedure: Secondary | ICD-10-CM | POA: Diagnosis not present

## 2015-02-03 DIAGNOSIS — I255 Ischemic cardiomyopathy: Secondary | ICD-10-CM | POA: Diagnosis present

## 2015-02-03 DIAGNOSIS — R7989 Other specified abnormal findings of blood chemistry: Secondary | ICD-10-CM

## 2015-02-03 DIAGNOSIS — I319 Disease of pericardium, unspecified: Secondary | ICD-10-CM

## 2015-02-03 DIAGNOSIS — G479 Sleep disorder, unspecified: Secondary | ICD-10-CM | POA: Diagnosis present

## 2015-02-03 DIAGNOSIS — Z95810 Presence of automatic (implantable) cardiac defibrillator: Secondary | ICD-10-CM

## 2015-02-03 DIAGNOSIS — M109 Gout, unspecified: Secondary | ICD-10-CM | POA: Diagnosis present

## 2015-02-03 DIAGNOSIS — I2582 Chronic total occlusion of coronary artery: Secondary | ICD-10-CM

## 2015-02-03 DIAGNOSIS — I313 Pericardial effusion (noninflammatory): Secondary | ICD-10-CM

## 2015-02-03 DIAGNOSIS — I252 Old myocardial infarction: Secondary | ICD-10-CM

## 2015-02-03 DIAGNOSIS — F419 Anxiety disorder, unspecified: Secondary | ICD-10-CM | POA: Diagnosis present

## 2015-02-03 DIAGNOSIS — T40605A Adverse effect of unspecified narcotics, initial encounter: Secondary | ICD-10-CM | POA: Diagnosis not present

## 2015-02-03 DIAGNOSIS — I5022 Chronic systolic (congestive) heart failure: Secondary | ICD-10-CM | POA: Diagnosis present

## 2015-02-03 DIAGNOSIS — I314 Cardiac tamponade: Secondary | ICD-10-CM | POA: Diagnosis not present

## 2015-02-03 DIAGNOSIS — I952 Hypotension due to drugs: Secondary | ICD-10-CM | POA: Diagnosis not present

## 2015-02-03 DIAGNOSIS — Y840 Cardiac catheterization as the cause of abnormal reaction of the patient, or of later complication, without mention of misadventure at the time of the procedure: Secondary | ICD-10-CM | POA: Diagnosis not present

## 2015-02-03 DIAGNOSIS — D649 Anemia, unspecified: Secondary | ICD-10-CM | POA: Diagnosis present

## 2015-02-03 DIAGNOSIS — I25119 Atherosclerotic heart disease of native coronary artery with unspecified angina pectoris: Principal | ICD-10-CM | POA: Diagnosis present

## 2015-02-03 DIAGNOSIS — Z7902 Long term (current) use of antithrombotics/antiplatelets: Secondary | ICD-10-CM

## 2015-02-03 DIAGNOSIS — E785 Hyperlipidemia, unspecified: Secondary | ICD-10-CM | POA: Diagnosis present

## 2015-02-03 DIAGNOSIS — Z87891 Personal history of nicotine dependence: Secondary | ICD-10-CM

## 2015-02-03 DIAGNOSIS — I480 Paroxysmal atrial fibrillation: Secondary | ICD-10-CM | POA: Diagnosis not present

## 2015-02-03 HISTORY — DX: Gout, unspecified: M10.9

## 2015-02-03 HISTORY — DX: Anxiety disorder, unspecified: F41.9

## 2015-02-03 HISTORY — DX: Cardiac tamponade: I31.4

## 2015-02-03 HISTORY — PX: PERCUTANEOUS CORONARY STENT INTERVENTION (PCI-S): SHX5485

## 2015-02-03 LAB — POCT ACTIVATED CLOTTING TIME
ACTIVATED CLOTTING TIME: 226 s
ACTIVATED CLOTTING TIME: 380 s
Activated Clotting Time: 288 seconds
Activated Clotting Time: 294 seconds
Activated Clotting Time: 300 seconds
Activated Clotting Time: 325 seconds
Activated Clotting Time: 294 seconds
Activated Clotting Time: 196 seconds

## 2015-02-03 LAB — TYPE AND SCREEN
ABO/RH(D): O POS
Antibody Screen: NEGATIVE

## 2015-02-03 LAB — GLUCOSE, CAPILLARY: Glucose-Capillary: 87 mg/dL (ref 70–99)

## 2015-02-03 LAB — ABO/RH: ABO/RH(D): O POS

## 2015-02-03 LAB — MRSA PCR SCREENING: MRSA by PCR: NEGATIVE

## 2015-02-03 SURGERY — PERCUTANEOUS CORONARY STENT INTERVENTION (PCI-S)
Anesthesia: LOCAL

## 2015-02-03 MED ORDER — ATROPINE SULFATE 0.1 MG/ML IJ SOLN
INTRAMUSCULAR | Status: AC
  Filled 2015-02-03: qty 10

## 2015-02-03 MED ORDER — CARVEDILOL 12.5 MG PO TABS
12.5000 mg | ORAL_TABLET | Freq: Two times a day (BID) | ORAL | Status: DC
  Administered 2015-02-04: 12.5 mg via ORAL
  Filled 2015-02-03 (×4): qty 1

## 2015-02-03 MED ORDER — ASPIRIN EC 81 MG PO TBEC
81.0000 mg | DELAYED_RELEASE_TABLET | Freq: Every day | ORAL | Status: DC
  Administered 2015-02-04 – 2015-02-08 (×5): 81 mg via ORAL
  Filled 2015-02-03 (×5): qty 1

## 2015-02-03 MED ORDER — ONDANSETRON HCL 4 MG/2ML IJ SOLN
INTRAMUSCULAR | Status: AC
  Filled 2015-02-03: qty 2

## 2015-02-03 MED ORDER — CEFAZOLIN SODIUM 1-5 GM-% IV SOLN
INTRAVENOUS | Status: AC
  Filled 2015-02-03: qty 50

## 2015-02-03 MED ORDER — ACETAMINOPHEN 325 MG PO TABS: 650.0000 mg | ORAL_TABLET | ORAL | Status: DC | PRN

## 2015-02-03 MED ORDER — LIDOCAINE HCL (PF) 1 % IJ SOLN
INTRAMUSCULAR | Status: AC
  Filled 2015-02-03: qty 60

## 2015-02-03 MED ORDER — COLCHICINE 0.6 MG PO TABS: 0.6000 mg | ORAL_TABLET | Freq: Every day | ORAL | Status: DC | PRN

## 2015-02-03 MED ORDER — SODIUM CHLORIDE 0.9 % IJ SOLN: 3.0000 mL | INTRAMUSCULAR | Status: DC | PRN

## 2015-02-03 MED ORDER — MIDAZOLAM HCL 2 MG/2ML IJ SOLN
INTRAMUSCULAR | Status: AC
  Filled 2015-02-03: qty 2

## 2015-02-03 MED ORDER — FENTANYL CITRATE 0.05 MG/ML IJ SOLN
INTRAMUSCULAR | Status: AC
  Filled 2015-02-03: qty 2

## 2015-02-03 MED ORDER — CLOPIDOGREL BISULFATE 75 MG PO TABS: 75.0000 mg | ORAL_TABLET | Freq: Every day | ORAL | Status: DC

## 2015-02-03 MED ORDER — ASPIRIN 81 MG PO CHEW
81.0000 mg | CHEWABLE_TABLET | ORAL | Status: DC
Start: 2015-02-03 — End: 2015-02-03

## 2015-02-03 MED ORDER — HYDROMORPHONE HCL 1 MG/ML IJ SOLN
INTRAMUSCULAR | Status: AC
  Filled 2015-02-03: qty 1

## 2015-02-03 MED ORDER — SODIUM CHLORIDE 0.9 % IJ SOLN: 3.0000 mL | Freq: Two times a day (BID) | INTRAMUSCULAR | Status: DC

## 2015-02-03 MED ORDER — LORAZEPAM 1 MG PO TABS
1.0000 mg | ORAL_TABLET | ORAL | Status: DC | PRN
  Administered 2015-02-03 – 2015-02-07 (×4): 1 mg via ORAL
  Filled 2015-02-03 (×4): qty 1

## 2015-02-03 MED ORDER — NITROGLYCERIN 1 MG/10 ML FOR IR/CATH LAB
INTRA_ARTERIAL | Status: AC
  Filled 2015-02-03: qty 10

## 2015-02-03 MED ORDER — ONDANSETRON HCL 4 MG/2ML IJ SOLN: 4.0000 mg | Freq: Four times a day (QID) | INTRAMUSCULAR | Status: DC | PRN

## 2015-02-03 MED ORDER — CLOPIDOGREL BISULFATE 75 MG PO TABS
75.0000 mg | ORAL_TABLET | Freq: Every day | ORAL | Status: DC
  Administered 2015-02-04 – 2015-02-08 (×5): 75 mg via ORAL
  Filled 2015-02-03 (×5): qty 1

## 2015-02-03 MED ORDER — SPIRONOLACTONE 25 MG PO TABS
25.0000 mg | ORAL_TABLET | Freq: Every day | ORAL | Status: DC
  Administered 2015-02-04: 25 mg via ORAL
  Filled 2015-02-03 (×2): qty 1

## 2015-02-03 MED ORDER — MORPHINE SULFATE 2 MG/ML IJ SOLN
2.0000 mg | INTRAMUSCULAR | Status: DC | PRN
  Administered 2015-02-03 – 2015-02-04 (×4): 2 mg via INTRAVENOUS
  Filled 2015-02-03 (×4): qty 1

## 2015-02-03 MED ORDER — SODIUM CHLORIDE 0.9 % IV SOLN
1.0000 mL/kg/h | INTRAVENOUS | Status: AC
  Administered 2015-02-03: 1 mL/kg/h via INTRAVENOUS

## 2015-02-03 MED ORDER — SODIUM CHLORIDE 0.9 % IV SOLN: 250.0000 mL | INTRAVENOUS | Status: DC | PRN

## 2015-02-03 MED ORDER — PRAVASTATIN SODIUM 40 MG PO TABS
40.0000 mg | ORAL_TABLET | Freq: Every day | ORAL | Status: DC
  Administered 2015-02-03: 40 mg via ORAL
  Filled 2015-02-03 (×2): qty 1

## 2015-02-03 MED ORDER — NOREPINEPHRINE BITARTRATE 1 MG/ML IV SOLN
INTRAVENOUS | Status: AC
  Filled 2015-02-03: qty 4

## 2015-02-03 MED ORDER — ASPIRIN 81 MG PO CHEW: 81.0000 mg | CHEWABLE_TABLET | Freq: Every day | ORAL | Status: DC

## 2015-02-03 MED ORDER — HEPARIN SODIUM (PORCINE) 1000 UNIT/ML IJ SOLN
INTRAMUSCULAR | Status: AC
  Filled 2015-02-03: qty 1

## 2015-02-03 MED ORDER — MORPHINE SULFATE 10 MG/ML IJ SOLN: 2.0000 mg | INTRAMUSCULAR | Status: DC | PRN

## 2015-02-03 MED ORDER — HEPARIN (PORCINE) IN NACL 2-0.9 UNIT/ML-% IJ SOLN
INTRAMUSCULAR | Status: AC
  Filled 2015-02-03: qty 1500

## 2015-02-03 MED ORDER — ASPIRIN 81 MG PO CHEW
CHEWABLE_TABLET | ORAL | Status: AC
  Administered 2015-02-03: 81 mg
  Filled 2015-02-03: qty 1

## 2015-02-03 MED ORDER — NITROGLYCERIN 0.4 MG SL SUBL: 0.4000 mg | SUBLINGUAL_TABLET | SUBLINGUAL | Status: DC | PRN

## 2015-02-03 MED ORDER — CLOPIDOGREL BISULFATE 75 MG PO TABS
75.0000 mg | ORAL_TABLET | ORAL | Status: AC
  Administered 2015-02-03: 75 mg via ORAL

## 2015-02-03 MED ORDER — LISINOPRIL 20 MG PO TABS
20.0000 mg | ORAL_TABLET | Freq: Every day | ORAL | Status: DC
  Filled 2015-02-03 (×2): qty 1

## 2015-02-03 MED ORDER — CLOPIDOGREL BISULFATE 75 MG PO TABS
ORAL_TABLET | ORAL | Status: AC
  Filled 2015-02-03: qty 1

## 2015-02-03 MED ORDER — SODIUM CHLORIDE 0.9 % IV SOLN
INTRAVENOUS | Status: DC
  Administered 2015-02-03: 10:00:00 via INTRAVENOUS

## 2015-02-03 MED ORDER — ALLOPURINOL 300 MG PO TABS
300.0000 mg | ORAL_TABLET | Freq: Every day | ORAL | Status: DC
  Administered 2015-02-04 – 2015-02-08 (×5): 300 mg via ORAL
  Filled 2015-02-03 (×5): qty 1

## 2015-02-03 MED ORDER — LIDOCAINE HCL (PF) 1 % IJ SOLN
INTRAMUSCULAR | Status: AC
  Filled 2015-02-03: qty 30

## 2015-02-03 MED ORDER — ADULT MULTIVITAMIN W/MINERALS CH
1.0000 | ORAL_TABLET | Freq: Every day | ORAL | Status: DC
  Administered 2015-02-04 – 2015-02-08 (×5): 1 via ORAL
  Filled 2015-02-03 (×5): qty 1

## 2015-02-03 NOTE — Progress Notes (Signed)
Pt verbally aggressive with nurse. Pt stated that if he is not given food now, then he "will get up and walk out, and does not care if he bleeds out." Pt said he "would rather bleed out than not eat any longer." Pt explained risks of eating/advancing diet prior to sheath removal, but remains noncompliant. Pt given crackers and peanut butter. Will continue to monitor pt.

## 2015-02-03 NOTE — Progress Notes (Signed)
Echocardiogram 2D Echocardiogram has been performed.  Kerry Mason 02/03/2015, 7:38 PM

## 2015-02-03 NOTE — H&P (View-Only) (Signed)
Kerry Mason Date of Birth: 05-25-1956 Medical Record #[id number]  History of Present Illness: Kerry Mason is seen back today for follow up of CAD and CHF.  He has known CAD with remote stenting of the RCA in 2000 and then lost to follow up. Had STEMI in January of 2015 - with cath showing occlusion of the proximal RCA with collaterals and occlusion of the proximal LAD. RCA occlusion felt to be chronic. EF 35% by cath and 30% by echo. Proximal and mid to distal LAD were stented with DES. This was associated with acute renal insufficiency that resolved. He  had a subsequent ICD implanted. Other issues include tobacco abuse- now stopped, gout, HLD, and HTN. Last month he reported some increased SOB and chest pain. Myoview study was high risk with large area of anterior, septal and apical scar with inferoapical ischemia. EF 29%. He underwent repeat cardiac cath that showed the stents in the LAD were widely patent. The first diagonal had an 80% stenosis. The RCA was occluded proximally with left to right collaterals. He continues to complain of chest heaviness and burning at times. He feels exhausted at the end of the day.    Current Outpatient Prescriptions  Medication Sig Dispense Refill  . allopurinol (ZYLOPRIM) 300 MG tablet Take 1 tablet (300 mg total) by mouth daily. 90 tablet 3  . aspirin EC 81 MG tablet Take 81 mg by mouth daily.    . carvedilol (COREG) 12.5 MG tablet Take 1 tablet (12.5 mg total) by mouth 2 (two) times daily. 60 tablet 6  . clopidogrel (PLAVIX) 75 MG tablet Take 1 tablet (75 mg total) by mouth daily. 90 tablet 3  . colchicine (COLCRYS) 0.6 MG tablet Take 1 tablet (0.6 mg total) by mouth daily as needed (gout flare ups). 30 tablet 6  . Fluocinonide 0.1 % CREA Apply 1 application topically 3 (three) times daily as needed (rash - on legs, hands and bottom).    . furosemide (LASIX) 20 MG tablet Take 1 tablet (20 mg total) by mouth daily. To take "as needed" for weight gain of  2 to 3 pounds overnight (Patient taking differently: Take 20 mg by mouth daily. ) 90 tablet 3  . lisinopril (PRINIVIL,ZESTRIL) 20 MG tablet Take 1 tablet (20 mg total) by mouth daily. 90 tablet 3  . Multiple Vitamin (MULTIVITAMIN WITH MINERALS) TABS tablet Take 1 tablet by mouth daily.    . nitroGLYCERIN (NITROSTAT) 0.4 MG SL tablet Place 1 tablet (0.4 mg total) under the tongue every 5 (five) minutes as needed for chest pain. 25 tablet 11  . pravastatin (PRAVACHOL) 40 MG tablet Take 1 tablet (40 mg total) by mouth at bedtime. 90 tablet 3  . spironolactone (ALDACTONE) 25 MG tablet Take 1 tablet (25 mg total) by mouth at bedtime. 90 tablet 3  . triamcinolone cream (KENALOG) 0.1 % Apply 1 application topically 5 (five) times daily as needed (rash on legs).     No current facility-administered medications for this visit.    No Known Allergies  Past Medical History  Diagnosis Date  . CAD (coronary artery disease)     a. 2000: s/p stent of RCA 2000 with BMS  . Tobacco abuse   . Obesity   . ST elevation myocardial infarction (STEMI) of anterior wall, subsequent episode of care   . HTN (hypertension)   . Hyperlipidemia   . Acute systolic CHF (congestive heart failure)   . Ischemic cardiomyopathy   . AICD (automatic cardioverter/defibrillator)  present     Past Surgical History  Procedure Laterality Date  . Cardiac catheterization    . Coronary angioplasty  2000  . Coronary angioplasty with stent placement  2015  . Left heart catheterization with coronary angiogram N/A 12/11/2013    Procedure: LEFT HEART CATHETERIZATION WITH CORONARY ANGIOGRAM;  Surgeon: Peter M Martinique, MD;  Location: [facility] CATH LAB;  Service: Cardiovascular;  Laterality: N/A;  . Percutaneous coronary stent intervention (pci-s)  12/11/2013    Procedure: PERCUTANEOUS CORONARY STENT INTERVENTION (PCI-S);  Surgeon: Peter M Martinique, MD;  Location: [facility] CATH LAB;  Service: Cardiovascular;;  prov LAD and mid LAD  . Implantable  cardioverter defibrillator implant N/A 04/17/2014    Procedure: IMPLANTABLE CARDIOVERTER DEFIBRILLATOR IMPLANT;  Surgeon: Evans Lance, MD;  Location: [facility] CATH LAB;  Service: Cardiovascular;  Laterality: N/A;  . Left heart catheterization with coronary angiogram N/A 01/05/2015    Procedure: LEFT HEART CATHETERIZATION WITH CORONARY ANGIOGRAM;  Surgeon: Peter M Martinique, MD;  Location: [facility] CATH LAB;  Service: Cardiovascular;  Laterality: N/A;    History  Smoking status  . Former Smoker -- 1.00 packs/day for 35 years  . Types: Cigarettes  . Quit date: 12/11/2013  Smokeless tobacco  . Not on file    History  Alcohol Use No    Family History  Problem Relation Age of Onset  . CAD Father     PTCA    Review of Systems: The review of systems is per the HPI.  All other systems were reviewed and are negative.  Physical Exam: BP 110/72 mmHg  Pulse 62  Ht 6\' 1"  (1.854 m)  Wt 209 lb (94.802 kg)  BMI 27.58 kg/m2 Patient is very pleasant and in no acute distress. Skin is warm and dry. Color is normal.  HEENT is unremarkable. Normocephalic/atraumatic. PERRL. Sclera are nonicteric. Neck is supple. No masses. No JVD. ICD site has healed well. Lungs are clear. Cardiac exam shows a regular rate and rhythm. normal S1-2. No gallop or murmur. Abdomen is soft. Extremities are without edema. Gait and ROM are intact. No gross neurologic deficits noted.  Wt Readings from Last 3 Encounters:  01/22/15 209 lb (94.802 kg)  12/29/14 207 lb 11.2 oz (94.212 kg)  12/18/14 209 lb (94.802 kg)    LABORATORY DATA/PROCEDURES:  Lab Results  Component Value Date   WBC 10.6* 12/29/2014   HGB 15.9 12/29/2014   HCT 46.2 12/29/2014   PLT 284 12/29/2014   GLUCOSE 86 01/05/2015   CHOL 132 12/29/2014   TRIG 115 12/29/2014   HDL 36* 12/29/2014   LDLCALC 73 12/29/2014   ALT 10 12/29/2014   AST 16 12/29/2014   NA 137 01/05/2015   K 4.4 01/05/2015   CL 107 01/05/2015   CREATININE 0.82 01/05/2015   BUN 15  01/05/2015   CO2 26 01/05/2015   TSH 1.265 12/11/2013   INR 1.04 01/05/2015   HGBA1C 5.8* 12/11/2013    BNP (last 3 results)  Recent Labs  06/10/14 1349  PROBNP 474.9*   Lab Results  Component Value Date   TROPONINI <0.30 06/10/2014      Assessment / Plan: 1. CAD s/p anterior STEMI with extensive LAD stenting. Old occlusion of RCA. Recent  chest pain symptoms similar to prior infarct pain. Myoview study high risk. LAD stents still patent. EF low at 30% but EDP only 11. I think his refractory symptoms may be related to his RCA occlusion. His inferior wall appears viable. I have recommended CTO PCI of  the RCA to try and improve his symptoms and potentially help with his LV dysfunction. The procedure and risks were reviewed including but not limited to death, myocardial infarction, stroke, arrythmias, bleeding, transfusion, emergency surgery, dye allergy, or renal dysfunction. The patient voices understanding and is agreeable to proceed. Will continue ASA and Plavix.  2. Chronic Systolic heart failure -EF 30%.  Will continue current Coreg, lisinopril, and aldactone Rx. Recommend he take lasix 20 mg daily.   3. Underlying ICD - palpitations - continue beta blocker. No significant arrhythmia noted on ICD interrogation in Nov.  4. Hyperlipidemia. Well controlled on Pravachol.   5. Tobacco abuse- now quit.

## 2015-02-03 NOTE — CV Procedure (Signed)
CARDIAC CATH NOTE  Name: Kerry Mason MRN: [id number] DOB: Apr 08, 1956  Procedure: CTO PCI and stenting of the RCA. Coronary perforation sealed with a Graftmaster covered stent. Pericardiocentesis for cardiac tamponade.   Assitant: Casandra Doffing MD.  Indication: 59 yo WM with CAD, ischemic cardiomyopathy and refractory angina despite optimal medical therapy. High risk Myoview. Cardiac cath showed CTO of the proximal RCA with collaterals.    Procedural Details: The right groin was prepped, draped, and anesthetized with 1% lidocaine. Using the modified Seldinger technique, an 8 Fr long sheath was introduced into the right femoral artery. The left groin was prepped, draped, and anesthetized with 1% lidocaine.  Weight-based Heparin  was given for anticoagulation. Once a therapeutic ACT was achieved, an 8  [place] 0.75 guide catheter was inserted in the RCA.  A 7 French CLS 4.0 guide was used to engage the Left main. The RCA was occluded proximally. Using a Corsair catheter for support we were able to advance a Miracle Brothers 6 wire into the prior stent in the mid RCA. The Corsair was able to be passed to the distal stent margin. A Whisper wire was then able to be passed into the distal RCA and position confirmed with retrograde angiography. We then attempted to cross with a balloon but guide and wire position were lost when we tried to advance the balloon. We were again able to advance to the mid RCA with a Miracle brothers 6 wire and a Corsair catheter. The distal RCA was accessed with a Pilot 200 wire. Again position was confirmed. We then progressively dilated the distal to proximal RCA with a 1.5 x 20 mm balloon, 2.0 x 30 mm balloon, and a 2.5 x 30 mm balloon. Angiography at this point demonstrated a perforation in the mid RCA with extensive dissection. We quickly placed a 3.5 x 15 mm balloon in the mid RCA and it was inflated to seal the perforation. Despite prolonged inflations the perforation  persisted with balloon deflation. We then prepared to stent the mid RCA with a covered stent. A guideliner catheter was advanced to the mid RCA. The 3.5 balloon was withdrawn and a 3.5 x 19 mm Graftmaster covered stent was deployed in the mid RCA to 16 atm with good seal of the perforation. At this point the patient was hemodynamically stable. We then proceeded to stent the distal RCA with a 2.5 x 38 mm Synergy stent. A 3.0 x 28 mm Synergy stent was placed at the crux overlapping the distal stent and the Graftmaster. A 3.5 x 38 mm Synergy stent was deployed in the proximal RCA and a 4.0 x 20 mm Synergy stent at the ostium.  All stents were deployed with the stent balloons to 14 atm. We did not post dilate due to the perforation.   Following PCI, there was 0% residual stenosis and TIMI-3 flow. Final angiography confirmed an excellent result.  Femoral hemostasis was achieved with a Perclose device in the left groin. Closure of the right groin was unsuccessful so an 8 Fr sheath was left in place and this will be removed manually in the recovery area. At the end of procedure the patient complained of a sense of unease. He became hypotensive and sweaty. Stat Echo revealed a large pericardial effusion due to the perforation. He had a narrow pulse pressure with pulsus paradoxus. We proceeded with emergent pericardiocentesis. The subxyphoid area was prepped and draped in a sterile fashion. The area was anesthetized with 1% lidocaine. A  pericardial needle was used to enter the pericardial space and a wire was advanced into this space. Position was confirmed with Echo and fluoro. A pericardial drain was placed with removal of approximately 600 cc of bloody fluid. The patient immediately felt better and BP returned to normal. Echo showed complete resolution of the effusion without re-accumulation.  The pericardial drain was left in place for continued monitoring. The patient was transferred to the cardiac ICU for further  monitoring. 285 cc of contrast was used. Radiation dose 2.3 gray.   Lesion Data: Vessel: RCA  Percent stenosis (pre): 100%  TIMI-flow (pre):  0 Stent:  4.0 x 20, 3.5 x 38 mm, 3.0 x 28 mm, and 2.5 x 38 mm Synergy stents. 3.5 x 19 mm Graftmaster stent.  Percent stenosis (post): 0% TIMI-flow (post): 3  Conclusions: Successful CTO PCI of the RCA. Procedure complicated by coronary perforation with resultant pericardial tamponade. The perforation was sealed with a Graftmaster coated stent. Successful pericardiocentesis.   Recommendations: Will observe in ICU overnight with pericardial drain in place. Repeat Echo in am. If no significant residual bleeding drain could be removed in am. Continue DAPT with ASA and Plavix indefinitely.   Peter Martinique, [place]  02/03/2015, 8:03 PM

## 2015-02-03 NOTE — Interval H&P Note (Signed)
History and Physical Interval Note:  02/03/2015 3:36 PM  Kerry Mason  has presented today for surgery, with the diagnosis of cad  The various methods of treatment have been discussed with the patient and family. After consideration of risks, benefits and other options for treatment, the patient has consented to  Procedure(s): PERCUTANEOUS CORONARY STENT INTERVENTION (PCI-S) (N/A) as a surgical intervention .  The patient's history has been reviewed, patient examined, no change in status, stable for surgery.  I have reviewed the patient's chart and labs.  Questions were answered to the patient's satisfaction.   Cath Lab Visit (complete for each Cath Lab visit)  Clinical Evaluation Leading to the Procedure:   ACS: No.  Non-ACS:    Anginal Classification: CCS III  Anti-ischemic medical therapy: Maximal Therapy (2 or more classes of medications)  Non-Invasive Test Results: High-risk stress test findings: cardiac mortality >3%/year  Prior CABG: No previous CABG        [name] 02/03/2015 3:36 PM

## 2015-02-04 DIAGNOSIS — I255 Ischemic cardiomyopathy: Secondary | ICD-10-CM | POA: Diagnosis present

## 2015-02-04 DIAGNOSIS — I2582 Chronic total occlusion of coronary artery: Secondary | ICD-10-CM | POA: Diagnosis present

## 2015-02-04 DIAGNOSIS — D649 Anemia, unspecified: Secondary | ICD-10-CM | POA: Diagnosis present

## 2015-02-04 DIAGNOSIS — I319 Disease of pericardium, unspecified: Secondary | ICD-10-CM

## 2015-02-04 DIAGNOSIS — G479 Sleep disorder, unspecified: Secondary | ICD-10-CM | POA: Diagnosis present

## 2015-02-04 DIAGNOSIS — F419 Anxiety disorder, unspecified: Secondary | ICD-10-CM | POA: Diagnosis present

## 2015-02-04 DIAGNOSIS — I952 Hypotension due to drugs: Secondary | ICD-10-CM | POA: Diagnosis not present

## 2015-02-04 DIAGNOSIS — M109 Gout, unspecified: Secondary | ICD-10-CM | POA: Diagnosis present

## 2015-02-04 DIAGNOSIS — R079 Chest pain, unspecified: Secondary | ICD-10-CM | POA: Diagnosis present

## 2015-02-04 DIAGNOSIS — I9751 Accidental puncture and laceration of a circulatory system organ or structure during a circulatory system procedure: Secondary | ICD-10-CM | POA: Diagnosis not present

## 2015-02-04 DIAGNOSIS — Z7982 Long term (current) use of aspirin: Secondary | ICD-10-CM | POA: Diagnosis not present

## 2015-02-04 DIAGNOSIS — I314 Cardiac tamponade: Secondary | ICD-10-CM

## 2015-02-04 DIAGNOSIS — I2510 Atherosclerotic heart disease of native coronary artery without angina pectoris: Secondary | ICD-10-CM

## 2015-02-04 DIAGNOSIS — Y840 Cardiac catheterization as the cause of abnormal reaction of the patient, or of later complication, without mention of misadventure at the time of the procedure: Secondary | ICD-10-CM | POA: Diagnosis not present

## 2015-02-04 DIAGNOSIS — I10 Essential (primary) hypertension: Secondary | ICD-10-CM | POA: Diagnosis present

## 2015-02-04 DIAGNOSIS — E785 Hyperlipidemia, unspecified: Secondary | ICD-10-CM

## 2015-02-04 DIAGNOSIS — Z87891 Personal history of nicotine dependence: Secondary | ICD-10-CM | POA: Diagnosis not present

## 2015-02-04 DIAGNOSIS — I25119 Atherosclerotic heart disease of native coronary artery with unspecified angina pectoris: Secondary | ICD-10-CM | POA: Diagnosis present

## 2015-02-04 DIAGNOSIS — Z955 Presence of coronary angioplasty implant and graft: Secondary | ICD-10-CM | POA: Diagnosis not present

## 2015-02-04 DIAGNOSIS — I252 Old myocardial infarction: Secondary | ICD-10-CM | POA: Diagnosis not present

## 2015-02-04 DIAGNOSIS — I480 Paroxysmal atrial fibrillation: Secondary | ICD-10-CM | POA: Diagnosis not present

## 2015-02-04 DIAGNOSIS — Z7902 Long term (current) use of antithrombotics/antiplatelets: Secondary | ICD-10-CM | POA: Diagnosis not present

## 2015-02-04 DIAGNOSIS — T40605A Adverse effect of unspecified narcotics, initial encounter: Secondary | ICD-10-CM | POA: Diagnosis not present

## 2015-02-04 DIAGNOSIS — I2584 Coronary atherosclerosis due to calcified coronary lesion: Secondary | ICD-10-CM

## 2015-02-04 DIAGNOSIS — I5022 Chronic systolic (congestive) heart failure: Secondary | ICD-10-CM | POA: Diagnosis present

## 2015-02-04 DIAGNOSIS — Z6826 Body mass index (BMI) 26.0-26.9, adult: Secondary | ICD-10-CM | POA: Diagnosis not present

## 2015-02-04 DIAGNOSIS — E669 Obesity, unspecified: Secondary | ICD-10-CM | POA: Diagnosis present

## 2015-02-04 DIAGNOSIS — Z95810 Presence of automatic (implantable) cardiac defibrillator: Secondary | ICD-10-CM | POA: Diagnosis not present

## 2015-02-04 LAB — CBC
HCT: 38.9 % — ABNORMAL LOW (ref 39.0–52.0)
HEMATOCRIT: 40.9 % (ref 39.0–52.0)
HEMOGLOBIN: 13.1 g/dL (ref 13.0–17.0)
Hemoglobin: 14.1 g/dL (ref 13.0–17.0)
MCH: 30.1 pg (ref 26.0–34.0)
MCH: 30.2 pg (ref 26.0–34.0)
MCHC: 34.5 g/dL (ref 30.0–36.0)
MCHC: 33.7 g/dL (ref 30.0–36.0)
MCV: 87.4 fL (ref 78.0–100.0)
MCV: 89.6 fL (ref 78.0–100.0)
PLATELETS: 244 10*3/uL (ref 150–400)
PLATELETS: 294 10*3/uL (ref 150–400)
RBC: 4.68 MIL/uL (ref 4.22–5.81)
RBC: 4.34 MIL/uL (ref 4.22–5.81)
RDW: 13.4 % (ref 11.5–15.5)
RDW: 13.6 % (ref 11.5–15.5)
WBC: 14.8 10*3/uL — ABNORMAL HIGH (ref 4.0–10.5)
WBC: 17.3 10*3/uL — AB (ref 4.0–10.5)

## 2015-02-04 LAB — BASIC METABOLIC PANEL
Anion gap: 4 — ABNORMAL LOW (ref 5–15)
BUN: 20 mg/dL (ref 6–23)
CHLORIDE: 104 mmol/L (ref 96–112)
CO2: 25 mmol/L (ref 19–32)
CREATININE: 1.04 mg/dL (ref 0.50–1.35)
Calcium: 8.4 mg/dL (ref 8.4–10.5)
GFR calc Af Amer: 90 mL/min — ABNORMAL LOW (ref >90)
GFR calc non Af Amer: 77 mL/min — ABNORMAL LOW (ref >90)
GLUCOSE: 130 mg/dL — AB (ref 70–99)
Potassium: 4.7 mmol/L (ref 3.5–5.1)
Sodium: 133 mmol/L — ABNORMAL LOW (ref 135–145)

## 2015-02-04 LAB — TROPONIN I
Troponin I: 0.54 ng/mL (ref <0.031)
Troponin I: 0.47 ng/mL — ABNORMAL HIGH (ref <0.031)

## 2015-02-04 LAB — POCT ACTIVATED CLOTTING TIME: Activated Clotting Time: 110 seconds

## 2015-02-04 LAB — HEMOGLOBIN AND HEMATOCRIT, BLOOD
HEMATOCRIT: 38.1 % — AB (ref 39.0–52.0)
HEMOGLOBIN: 13 g/dL (ref 13.0–17.0)

## 2015-02-04 MED ORDER — FENTANYL CITRATE 0.05 MG/ML IJ SOLN
50.0000 ug | Freq: Once | INTRAMUSCULAR | Status: AC
  Administered 2015-02-04: 50 ug via INTRAVENOUS
  Filled 2015-02-04: qty 2

## 2015-02-04 MED ORDER — CETYLPYRIDINIUM CHLORIDE 0.05 % MT LIQD
7.0000 mL | Freq: Two times a day (BID) | OROMUCOSAL | Status: DC
  Administered 2015-02-04 – 2015-02-06 (×4): 7 mL via OROMUCOSAL

## 2015-02-04 MED ORDER — ATORVASTATIN CALCIUM 40 MG PO TABS
40.0000 mg | ORAL_TABLET | Freq: Every day | ORAL | Status: DC
  Administered 2015-02-04 – 2015-02-07 (×4): 40 mg via ORAL
  Filled 2015-02-04 (×5): qty 1

## 2015-02-04 MED ORDER — SODIUM CHLORIDE 0.9 % IV SOLN
INTRAVENOUS | Status: DC
  Administered 2015-02-04 – 2015-02-06 (×4): via INTRAVENOUS

## 2015-02-04 MED ORDER — HEPARIN SODIUM (PORCINE) 5000 UNIT/ML IJ SOLN
5000.0000 [IU] | Freq: Three times a day (TID) | INTRAMUSCULAR | Status: DC
  Administered 2015-02-04 – 2015-02-08 (×12): 5000 [IU] via SUBCUTANEOUS
  Filled 2015-02-04 (×12): qty 1

## 2015-02-04 MED ORDER — SODIUM CHLORIDE 0.9 % IV SOLN: INTRAVENOUS | Status: DC

## 2015-02-04 MED ORDER — OXYCODONE HCL 5 MG PO TABS
5.0000 mg | ORAL_TABLET | ORAL | Status: DC | PRN
  Administered 2015-02-04 (×2): 10 mg via ORAL
  Filled 2015-02-04 (×4): qty 2

## 2015-02-04 MED ORDER — FENTANYL CITRATE 0.05 MG/ML IJ SOLN
50.0000 ug | INTRAMUSCULAR | Status: DC | PRN
  Administered 2015-02-04 – 2015-02-05 (×7): 50 ug via INTRAVENOUS
  Filled 2015-02-04 (×7): qty 2

## 2015-02-04 MED ORDER — ATROPINE SULFATE 0.1 MG/ML IJ SOLN
INTRAMUSCULAR | Status: AC
  Filled 2015-02-04: qty 10

## 2015-02-04 NOTE — Care Management Note (Signed)
    Page 1 of 1   02/04/2015     10:02:10 AM CARE MANAGEMENT NOTE 02/04/2015  Patient:  Kerry Mason, Kerry Mason   Account Number:  [id number]  Date Initiated:  02/04/2015  Documentation initiated by:  Elissa Hefty  Subjective/Objective Assessment:   adm w cad, pericadiocentesis     Action/Plan:   lives w wife, pcp dr Velna Hatchet   Anticipated DC Date:     Anticipated DC Plan:  HOME/SELF CARE         Choice offered to / List presented to:             Status of service:   Medicare Important Message given?   (If response is "NO", the following Medicare IM given date fields will be blank) Date Medicare IM given:   Medicare IM given by:   Date Additional Medicare IM given:   Additional Medicare IM given by:    Discharge Disposition:    Per UR Regulation:  Reviewed for med. necessity/level of care/duration of stay  If discussed at [place] of Stay Meetings, dates discussed:    Comments:

## 2015-02-04 NOTE — Progress Notes (Signed)
Pt's SBP 80's MAP 60, O2 92% on 4L [place]; Pt complains of mild chest discomfort; Last Pain PRN medication given was Fent 2044 70mcg; Pericardial site has old drainage and pulsating; Pericardial drain was flushed at 2000 with 39mls; Currently no drainage from tube; Cardiology Fellow, Claiborne Billings MD, at the bedside to assess pericardial drain. No orders given. Will continue to monitor pt.

## 2015-02-04 NOTE — Progress Notes (Signed)
Subjective:  C/o significant pleuritic cp  Objective:   Vital Signs : Filed Vitals:   02/04/15 0400 02/04/15 0500 02/04/15 0600 02/04/15 0700  BP: 100/48 101/65 118/68 114/61  Pulse: 84 74 83 82  Temp: 97.6 F (36.4 C)   97.7 F (36.5 C)  TempSrc: Oral   Oral  Resp: 20 26 28 22   Height:      Weight:      SpO2: 93% 90% 94% 95%    Intake/Output from previous day:  Intake/Output Summary (Last 24 hours) at 02/04/15 0803 Last data filed at 02/04/15 0600  Gross per 24 hour  Intake 1247.55 ml  Output    200 ml  Net 1047.55 ml    I/O since admission: +1047  Pericardial drain: 690 cc in cath lab; 200 cc overnight; significantly reduced over the last several hours  Wt Readings from Last 3 Encounters:  02/03/15 199 lb 8.3 oz (90.5 kg)  01/22/15 209 lb (94.802 kg)  12/29/14 207 lb 11.2 oz (94.212 kg)    Medications: . allopurinol  300 mg Oral Daily  . antiseptic oral rinse  7 mL Mouth Rinse BID  . aspirin EC  81 mg Oral Daily  . carvedilol  12.5 mg Oral BID  . clopidogrel  75 mg Oral Daily  . lisinopril  20 mg Oral Daily  . multivitamin with minerals  1 tablet Oral Daily  . pravastatin  40 mg Oral QHS  . spironolactone  25 mg Oral QHS       Physical Exam:   General appearance: alert, cooperative and mild distress Neck: no adenopathy, no carotid bruit, no JVD, supple, symmetrical, trachea midline and thyroid not enlarged, symmetric, no tenderness/mass/nodules Lungs: no wheezing Heart: RRR 1/6 sem;  intermittent rub Abdomen: soft, non-tender; bowel sounds normal; no masses,  no organomegaly Both groin cath sites stable Extremities: no edema, redness or tenderness in the calves or thighs Pulses: 2+ and symmetric Skin: Skin color, texture, turgor normal. No rashes or lesions Neurologic: Grossly normal    Rate: 79  Rhythm: normal sinus rhythm  ECG (independently read by me): NSR at 61 with old anteroseptal MI with slightly progressive anterolateral evolving T  abnormalities and .5 mm ST elevation inferiorly with slight downsloping PR segment c/w pericardial   Lab Results:  BMP Latest Ref Rng 02/04/2015 01/29/2015 01/05/2015  Glucose 70 - 99 mg/dL 130(H) 69(L) -  BUN 6 - 23 mg/dL 20 24(H) -  Creatinine 0.50 - 1.35 mg/dL 1.04 0.97 -  Sodium 135 - 145 mmol/L 133(L) 134(L) -  Potassium 3.5 - 5.1 mmol/L 4.7 5.3 4.4  Chloride 96 - 112 mmol/L 104 102 -  CO2 19 - 32 mmol/L 25 25 -  Calcium 8.4 - 10.5 mg/dL 8.4 9.6 -     CBC Latest Ref Rng 02/04/2015 02/03/2015 01/29/2015  WBC 4.0 - 10.5 K/uL 17.3(H) 14.8(H) 10.2  Hemoglobin 13.0 - 17.0 g/dL 13.1 14.1 16.0  Hematocrit 39.0 - 52.0 % 38.9(L) 40.9 47.7  Platelets 150 - 400 K/uL 294 244 314     No results for input(s): TROPONINI in the last 72 hours.  Invalid input(s): CK, MB  Hepatic Function Panel No results for input(s): PROT, ALBUMIN, AST, ALT, ALKPHOS, BILITOT, BILIDIR, IBILI in the last 72 hours. No results for input(s): INR in the last 72 hours. BNP (last 3 results) No results for input(s): BNP in the last 8760 hours.  ProBNP (last 3 results)  Recent Labs  06/10/14 1349  PROBNP 474.9*  Lipid Panel     Component Value Date/Time   CHOL 132 12/29/2014 1015   TRIG 115 12/29/2014 1015   HDL 36* 12/29/2014 1015   CHOLHDL 3.7 12/29/2014 1015   VLDL 23 12/29/2014 1015   LDLCALC 73 12/29/2014 1015      Imaging:  No results found.    Assessment/Plan:   Active Problems:   CAD (coronary artery disease)   HTN (hypertension)   Hyperlipidemia   Chronic systolic heart failure   Abnormal nuclear stress test   1. CAD: s/p complicated PCI yesterday for CTO of mid RCA with coronary perforation and cardiac tamponade requiring emergent pericardiocentesis of 600 cc hemorrhagic pericardial fluid and Graftmaster coated stent.  2. S/p emergent pericardiocentesis; 690 cc removed in cath lab, 200 overnight, and minimal over the last hour. Plan for f/u echo this am.  Will keep drain in  place today  3. Ischemic cardiomyopathy;  EF 25 - 30% on echo; s/p remote ICD implant  4. HLP;  Will change pravastatin 40 mg to atorvastatin 40 mg for more aggressive treatment  5.  Ongoing chest pain. Suspect pleutitic rather than re-oclusion.  Will check troponins. He has been getting oxycodone and MS04  6. Hypotension: probably contributed by pain meds. With 285 cc contrast will resume NS at 50 cc/hr.   Troy Sine, MD, [facility] 02/04/2015, 8:03 AM

## 2015-02-04 NOTE — Progress Notes (Signed)
  Echocardiogram 2D Echocardiogram limited has been performed.  Kerry Mason 02/04/2015, 10:56 AM

## 2015-02-04 NOTE — Progress Notes (Signed)
  Echocardiogram 2D Echocardiogram limited has been performed. This is a follow up to previous echo from the morning of 02/04/2015   Kerry Mason FRANCES 02/04/2015, 11:56 AM

## 2015-02-05 ENCOUNTER — Encounter (HOSPITAL_COMMUNITY): Payer: Self-pay

## 2015-02-05 ENCOUNTER — Inpatient Hospital Stay (HOSPITAL_COMMUNITY): Payer: Medicaid Other

## 2015-02-05 DIAGNOSIS — I319 Disease of pericardium, unspecified: Secondary | ICD-10-CM

## 2015-02-05 DIAGNOSIS — I312 Hemopericardium, not elsewhere classified: Secondary | ICD-10-CM

## 2015-02-05 DIAGNOSIS — I5022 Chronic systolic (congestive) heart failure: Secondary | ICD-10-CM

## 2015-02-05 LAB — CBC
HCT: 33.5 % — ABNORMAL LOW (ref 39.0–52.0)
Hemoglobin: 11.5 g/dL — ABNORMAL LOW (ref 13.0–17.0)
MCH: 30 pg (ref 26.0–34.0)
MCHC: 34.3 g/dL (ref 30.0–36.0)
MCV: 87.5 fL (ref 78.0–100.0)
Platelets: 218 10*3/uL (ref 150–400)
RBC: 3.83 MIL/uL — ABNORMAL LOW (ref 4.22–5.81)
RDW: 13.5 % (ref 11.5–15.5)
WBC: 19.4 10*3/uL — ABNORMAL HIGH (ref 4.0–10.5)

## 2015-02-05 LAB — BASIC METABOLIC PANEL
ANION GAP: 5 (ref 5–15)
BUN: 16 mg/dL (ref 6–23)
CHLORIDE: 105 mmol/L (ref 96–112)
CO2: 24 mmol/L (ref 19–32)
Calcium: 8.3 mg/dL — ABNORMAL LOW (ref 8.4–10.5)
Creatinine, Ser: 1.09 mg/dL (ref 0.50–1.35)
GFR, EST AFRICAN AMERICAN: 85 mL/min — AB (ref >90)
GFR, EST NON AFRICAN AMERICAN: 73 mL/min — AB (ref >90)
Glucose, Bld: 120 mg/dL — ABNORMAL HIGH (ref 70–99)
POTASSIUM: 4.3 mmol/L (ref 3.5–5.1)
SODIUM: 134 mmol/L — AB (ref 135–145)

## 2015-02-05 LAB — BRAIN NATRIURETIC PEPTIDE: B NATRIURETIC PEPTIDE 5: 283.2 pg/mL — AB (ref 0.0–100.0)

## 2015-02-05 LAB — TROPONIN I: Troponin I: 0.58 ng/mL (ref <0.031)

## 2015-02-05 MED ORDER — SODIUM CHLORIDE 0.9 % IV BOLUS (SEPSIS)
250.0000 mL | Freq: Once | INTRAVENOUS | Status: AC
  Administered 2015-02-05: 250 mL via INTRAVENOUS

## 2015-02-05 MED ORDER — SODIUM CHLORIDE 0.9 % IV BOLUS (SEPSIS)
500.0000 mL | Freq: Once | INTRAVENOUS | Status: AC
  Administered 2015-02-05: 500 mL via INTRAVENOUS

## 2015-02-05 MED ORDER — MUSCLE RUB 10-15 % EX CREA
TOPICAL_CREAM | CUTANEOUS | Status: DC | PRN
  Administered 2015-02-05 – 2015-02-06 (×2): via TOPICAL
  Filled 2015-02-05: qty 85

## 2015-02-05 NOTE — Progress Notes (Signed)
Brief X-Cover Note  Called for hypotension. Kerry Mason is largely asymptomatic (still with pain with inspiration). BP have been soft 70-90/40-50s. Earlier, blood pressure improved with 250 ml NS. However, bp returned to 70s/40s. On exam, vitals reviewed; BP 76/52 mmHg  Pulse 87  Temp(Src) 98.5 F (36.9 C) (Oral)  Resp 29  Ht 6\' 2"  (1.88 m)  Wt 90.5 kg (199 lb 8.3 oz)  BMI 25.61 kg/m2  SpO2 97%alert, oriented, fully conversant. Heart sounds regular rhythm, nl S1/S2, no gallop. Bedside US revealed known reduced EF ~30%, collapsing IVC, not dilated and trivial to small pericardial effusion from subcostal view with no RA/RV collapse. Given fluid responsiveness and lack of dilated IVC, will give more IV fluids and continue to reassess.   Jules Husbands, MD

## 2015-02-05 NOTE — Progress Notes (Signed)
  Echocardiogram 2D Echocardiogram limited has been performed.  Karrine Kluttz FRANCES 02/05/2015, 10:17 AM

## 2015-02-05 NOTE — Progress Notes (Signed)
MD Peter Martinique called for pt update. Informed Martinique of abnormal ECG results of acute MI/Stemi. Martinique asked to keep pt NPO until he could come see patient this AM. Will continue to monitor.     CRITICAL VALUE ALERT  Critical value received:  Abnormal ECG  Date of notification:  02/05/15  Time of notification:  0635  Critical value read back:Yes.    Nurse who received alert:  Verlin Grills  MD notified (1st page):  Peter Martinique  Time of first page:  0645  Time MD responded:  [phone]

## 2015-02-05 NOTE — Progress Notes (Signed)
1340 Read cardiology's note. Will continue to follow pt and ambulate when orders given to do so. Graylon Good RN BSN 02/05/2015 1:42 PM

## 2015-02-05 NOTE — Progress Notes (Signed)
Patient seen and examined. Mild pericardial rub. CXR is clear. He is clearly more comfortable. Echo reviewed personally. He still has a small effusion anteriorly. No RV or IVC collapse. No evidence of tamponade. Using a syringe and positioning the patient on his side I was able to aspirate about 60-70 cc of mostly serous fluid with some pink tinge. I am reassured that there is no active bleeding at this time. We will leave the pericardial drain in place for now. Nursing to try and aspirate every few hours with repositioning to get a more accurate assessment of fluid. I think most of fluid now is inflammatory and should improve with colchicine and removal of blood from pericardial space.   Peter Martinique MD, [facility]  02/05/2015 11:52 AM

## 2015-02-05 NOTE — Progress Notes (Signed)
MD Jules Husbands paged at 1239 about pt's decreasing BP. Pressures remain in the 70-80/40-50's. Pt on 4L O2 and stating mid 90's. Pt reports same pain and "tightness when taking deep breaths or coughing".  MD ordered to give 250CC bolus NS.   At 0100 MD Claiborne Billings came to pt's bedside to examine. MD ordered 500cc NS bolus and to hold pain medications.   Will continue to monitor pt closely.

## 2015-02-05 NOTE — Progress Notes (Signed)
EKG CRITICAL VALUE     12 lead EKG performed.  Critical value noted. Verlin Grills, RN notified.   Floyd Lusignan H, CCT 02/05/2015 7:23 AM

## 2015-02-05 NOTE — Progress Notes (Signed)
Subjective:  C/o significant pleuritic cp  Objective:   Vital Signs : Filed Vitals:   02/05/15 0400 02/05/15 0500 02/05/15 0532 02/05/15 0600  BP: 123/73 75/49 103/47 85/61  Pulse: 84 81 83 83  Temp: 98.5 F (36.9 C)     TempSrc: Oral     Resp: 34 33 24 26  Height:      Weight:      SpO2: 94% 95% 95% 96%    Intake/Output from previous day:  Intake/Output Summary (Last 24 hours) at 02/05/15 0300 Last data filed at 02/05/15 0600  Gross per 24 hour  Intake   2100 ml  Output   1795 ml  Net    305 ml    I/O since admission: +1352  Pericardial drain yesterday am had clogged leading to increasing dyspnea, and recurrent pericardial effusion with recurrent tamponade findings; this was flushed and massaged with recurrent drainage. Echo subsequent to removal of additional 300 cc pericardial fluid again showed marked reduction in fluid and resolution of tamponade echo features  Wt Readings from Last 3 Encounters:  02/03/15 199 lb 8.3 oz (90.5 kg)  01/22/15 209 lb (94.802 kg)  12/29/14 207 lb 11.2 oz (94.212 kg)    Medications: . allopurinol  300 mg Oral Daily  . antiseptic oral rinse  7 mL Mouth Rinse BID  . aspirin EC  81 mg Oral Daily  . atorvastatin  40 mg Oral q1800  . clopidogrel  75 mg Oral Daily  . heparin subcutaneous  5,000 Units Subcutaneous 3 times per day  . multivitamin with minerals  1 tablet Oral Daily    . sodium chloride 50 mL/hr at 02/05/15 0105    Physical Exam:   General appearance: alert, cooperative and appears more comfortable this am Neck: no adenopathy, no carotid bruit, no JVD, supple, symmetrical, trachea midline and thyroid not enlarged, symmetric, no tenderness/mass/nodules Lungs: no wheezing Heart: RRR 1/6 sem;   Abdomen: soft, non-tender; bowel sounds normal; no masses,  no organomegaly Both groin cath sites stable Extremities: no edema, redness or tenderness in the calves or thighs Pulses: 2+ and symmetric Skin: Skin color,  texture, turgor normal. No rashes or lesions Neurologic: Grossly normal    Rate: 88  Rhythm: normal sinus rhythm  ECG (independently read by me): NSR with 0.5 to 1 mm STE inferiorly with sligh downsloping PR c/w pericarditis; QS and anterolateral ST changes c/w old Anterior MI  ECG yesterday: NSR at 76 with old anteroseptal MI with slightly progressive anterolateral evolving T abnormalities and .5 mm ST elevation inferiorly with slight downsloping PR segment c/w pericardial   Lab Results:  BMP Latest Ref Rng 02/05/2015 02/04/2015 01/29/2015  Glucose 70 - 99 mg/dL 120(H) 130(H) 69(L)  BUN 6 - 23 mg/dL 16 20 24(H)  Creatinine 0.50 - 1.35 mg/dL 1.09 1.04 0.97  Sodium 135 - 145 mmol/L 134(L) 133(L) 134(L)  Potassium 3.5 - 5.1 mmol/L 4.3 4.7 5.3  Chloride 96 - 112 mmol/L 105 104 102  CO2 19 - 32 mmol/L 24 25 25   Calcium 8.4 - 10.5 mg/dL 8.3(L) 8.4 9.6     CBC Latest Ref Rng 02/05/2015 02/04/2015 02/04/2015  WBC 4.0 - 10.5 K/uL 19.4(H) - 17.3(H)  Hemoglobin 13.0 - 17.0 g/dL 11.5(L) 13.0 13.1  Hematocrit 39.0 - 52.0 % 33.5(L) 38.1(L) 38.9(L)  Platelets 150 - 400 K/uL 218 - 294      Recent Labs  02/04/15 1000 02/04/15 1431  TROPONINI 0.54* 0.47*    Hepatic Function  Panel No results for input(s): PROT, ALBUMIN, AST, ALT, ALKPHOS, BILITOT, BILIDIR, IBILI in the last 72 hours. No results for input(s): INR in the last 72 hours. BNP (last 3 results) No results for input(s): BNP in the last 8760 hours.    Lipid Panel     Component Value Date/Time   CHOL 132 12/29/2014 1015   TRIG 115 12/29/2014 1015   HDL 36* 12/29/2014 1015   CHOLHDL 3.7 12/29/2014 1015   VLDL 23 12/29/2014 1015   LDLCALC 73 12/29/2014 1015      Imaging:  No results found.    Assessment/Plan:   Active Problems:   CAD (coronary artery disease)   HTN (hypertension)   Hyperlipidemia   Chronic systolic heart failure   Abnormal nuclear stress test   Coronary artery disease   1. CAD: s/p complicated  PCI on 3/2 for CTO of mid RCA with coronary perforation and cardiac tamponade requiring emergent pericardiocentesis of 690 cc hemorrhagic pericardial fluid and Graftmaster coated stent in cath lab. Yesterday am pericardial drainage had essentially stopped, patient had become more dyspneic and early echo yesterday am revealed recurrent pericardial effusion with recurrent tamponade findings;the pericardial catheter was flushed multiple times and massaged with return of significant  drainage. Echo subsequent to removal of additional 300 cc pericardial fluid later yesterday again showed marked reduction in fluid and resolution of tamponade echo features.  Portable bedside echo done at 1am when patient was hypotensive revealed known reduced EF ~30%, collapsing IVC, not dilated and trivial to small pericardial effusion from subcostal view with no RA/RV collapse. Given fluid responsiveness and lack of dilated IVC, he was treated with increasing IV fluids.   2. S/p emergent pericardiocentesis; 50 cc overnight and essentially stopped.  Re messaged and flushed; fluid is now clear, not hemorrhagic. Will re check echo this am and if no recurrent effusion then probably dc drain.  Will check cxr. Will need repeat echo tomorrow if remains stable post drain pulled  3. Ischemic cardiomyopathy;  EF 25 - 30% on echo; s/p remote ICD implant  4. HLP;pravastatin 40 mg changed to atorvastatin 40 mg for more aggressive treatment  5. Pleuritic chest pain. Improved this am. Suspect pleutitic rather than re-oclusion.  Troponins .63 yesterday  6. Hypotension: Improved probably contributed by pain meds and volume status. Currently receiving NS at 50 cc/hr.  Will check BNP today  7. Anemia: Hb/HCT decreased from 13.1/38.9 to 11.5/33.5  Time spent 45 min  Discussed at length with patient and wife.  Troy Sine, MD, [facility] 02/05/2015, 8:07 AM

## 2015-02-06 DIAGNOSIS — I3139 Other pericardial effusion (noninflammatory): Secondary | ICD-10-CM | POA: Insufficient documentation

## 2015-02-06 DIAGNOSIS — I313 Pericardial effusion (noninflammatory): Secondary | ICD-10-CM | POA: Insufficient documentation

## 2015-02-06 DIAGNOSIS — I319 Disease of pericardium, unspecified: Secondary | ICD-10-CM

## 2015-02-06 LAB — CBC
HCT: 32.9 % — ABNORMAL LOW (ref 39.0–52.0)
Hemoglobin: 11.5 g/dL — ABNORMAL LOW (ref 13.0–17.0)
MCH: 30.3 pg (ref 26.0–34.0)
MCHC: 35 g/dL (ref 30.0–36.0)
MCV: 86.8 fL (ref 78.0–100.0)
PLATELETS: 223 10*3/uL (ref 150–400)
RBC: 3.79 MIL/uL — ABNORMAL LOW (ref 4.22–5.81)
RDW: 13.4 % (ref 11.5–15.5)
WBC: 12.5 10*3/uL — ABNORMAL HIGH (ref 4.0–10.5)

## 2015-02-06 LAB — BASIC METABOLIC PANEL
Anion gap: 8 (ref 5–15)
BUN: 13 mg/dL (ref 6–23)
CO2: 22 mmol/L (ref 19–32)
Calcium: 8.6 mg/dL (ref 8.4–10.5)
Chloride: 106 mmol/L (ref 96–112)
Creatinine, Ser: 0.93 mg/dL (ref 0.50–1.35)
Glucose, Bld: 106 mg/dL — ABNORMAL HIGH (ref 70–99)
Potassium: 4.1 mmol/L (ref 3.5–5.1)
SODIUM: 136 mmol/L (ref 135–145)

## 2015-02-06 MED ORDER — AMIODARONE HCL IN DEXTROSE 360-4.14 MG/200ML-% IV SOLN
30.0000 mg/h | INTRAVENOUS | Status: DC
  Administered 2015-02-06 – 2015-02-07 (×3): 30 mg/h via INTRAVENOUS
  Filled 2015-02-06 (×4): qty 200

## 2015-02-06 MED ORDER — SPIRONOLACTONE 25 MG PO TABS
25.0000 mg | ORAL_TABLET | Freq: Every day | ORAL | Status: DC
  Administered 2015-02-06 – 2015-02-07 (×2): 25 mg via ORAL
  Filled 2015-02-06 (×3): qty 1

## 2015-02-06 MED ORDER — AMIODARONE IV BOLUS ONLY 150 MG/100ML: 150.0000 mg | Freq: Once | INTRAVENOUS | Status: DC

## 2015-02-06 MED ORDER — SPIRONOLACTONE 25 MG PO TABS
25.0000 mg | ORAL_TABLET | Freq: Every day | ORAL | Status: DC
  Filled 2015-02-06: qty 1

## 2015-02-06 MED ORDER — AMIODARONE LOAD VIA INFUSION
150.0000 mg | Freq: Once | INTRAVENOUS | Status: AC
  Administered 2015-02-06: 150 mg via INTRAVENOUS
  Filled 2015-02-06: qty 83.34

## 2015-02-06 MED ORDER — AMIODARONE HCL IN DEXTROSE 360-4.14 MG/200ML-% IV SOLN
60.0000 mg/h | INTRAVENOUS | Status: AC
  Administered 2015-02-06 (×2): 60 mg/h via INTRAVENOUS
  Filled 2015-02-06 (×2): qty 200

## 2015-02-06 MED ORDER — CARVEDILOL 6.25 MG PO TABS
6.2500 mg | ORAL_TABLET | Freq: Two times a day (BID) | ORAL | Status: DC
  Administered 2015-02-06 (×2): 6.25 mg via ORAL
  Filled 2015-02-06 (×5): qty 1

## 2015-02-06 MED ORDER — COLCHICINE 0.6 MG PO TABS
0.6000 mg | ORAL_TABLET | Freq: Every day | ORAL | Status: DC
  Administered 2015-02-06 – 2015-02-08 (×3): 0.6 mg via ORAL
  Filled 2015-02-06 (×3): qty 1

## 2015-02-06 MED ORDER — LISINOPRIL 10 MG PO TABS
10.0000 mg | ORAL_TABLET | Freq: Every day | ORAL | Status: DC
  Administered 2015-02-06: 10 mg via ORAL
  Filled 2015-02-06 (×2): qty 1

## 2015-02-06 NOTE — Progress Notes (Addendum)
Patient ID: Kerry Mason, male   DOB: Apr 10, 1956, 59 y.o.   MRN: [id number]    Subjective:  No further chest pain. About 234 cc pericardial fluid yesterday from drain, 34 cc overnight.   Objective:   Vital Signs : Filed Vitals:   02/06/15 0400 02/06/15 0500 02/06/15 0600 02/06/15 0700  BP: 140/57 128/56 121/63 153/61  Pulse: 83 80 82 86  Temp: 98.4 F (36.9 C)     TempSrc: Oral     Resp: 22 27 27 20   Height:      Weight:      SpO2: 91% 91% 92% 97%    Intake/Output from previous day:  Intake/Output Summary (Last 24 hours) at 02/06/15 0726 Last data filed at 02/06/15 0600  Gross per 24 hour  Intake   1678 ml  Output   2484 ml  Net   -806 ml    Wt Readings from Last 3 Encounters:  02/03/15 199 lb 8.3 oz (90.5 kg)  01/22/15 209 lb (94.802 kg)  12/29/14 207 lb 11.2 oz (94.212 kg)    Medications: . allopurinol  300 mg Oral Daily  . antiseptic oral rinse  7 mL Mouth Rinse BID  . aspirin EC  81 mg Oral Daily  . atorvastatin  40 mg Oral q1800  . clopidogrel  75 mg Oral Daily  . colchicine  0.6 mg Oral Daily  . heparin subcutaneous  5,000 Units Subcutaneous 3 times per day  . multivitamin with minerals  1 tablet Oral Daily    . sodium chloride 50 mL/hr at 02/06/15 0118    Physical Exam:   General appearance: alert, cooperative and appears more comfortable this am Neck: no adenopathy, no carotid bruit, no JVD, supple, symmetrical, trachea midline and thyroid not enlarged, symmetric, no tenderness/mass/nodules Lungs: no wheezing Heart: RRR 1/6 sem; no rub.   Abdomen: soft, non-tender; bowel sounds normal; no masses,  no organomegaly Both groin cath sites stable Extremities: no edema, redness or tenderness in the calves or thighs Pulses: 2+ and symmetric Skin: Skin color, texture, turgor normal. No rashes or lesions Neurologic: Grossly normal   Rhythm: normal sinus rhythm  Lab Results:  BMP Latest Ref Rng 02/06/2015 02/05/2015 02/04/2015  Glucose 70 - 99 mg/dL  106(H) 120(H) 130(H)  BUN 6 - 23 mg/dL 13 16 20   Creatinine 0.50 - 1.35 mg/dL 0.93 1.09 1.04  Sodium 135 - 145 mmol/L 136 134(L) 133(L)  Potassium 3.5 - 5.1 mmol/L 4.1 4.3 4.7  Chloride 96 - 112 mmol/L 106 105 104  CO2 19 - 32 mmol/L 22 24 25   Calcium 8.4 - 10.5 mg/dL 8.6 8.3(L) 8.4     CBC Latest Ref Rng 02/06/2015 02/05/2015 02/04/2015  WBC 4.0 - 10.5 K/uL 12.5(H) 19.4(H) -  Hemoglobin 13.0 - 17.0 g/dL 11.5(L) 11.5(L) 13.0  Hematocrit 39.0 - 52.0 % 32.9(L) 33.5(L) 38.1(L)  Platelets 150 - 400 K/uL 223 218 -      Recent Labs  02/04/15 1000 02/04/15 1431  TROPONINI 0.54* 0.47*    Hepatic Function Panel No results for input(s): PROT, ALBUMIN, AST, ALT, ALKPHOS, BILITOT, BILIDIR, IBILI in the last 72 hours. No results for input(s): INR in the last 72 hours. BNP (last 3 results)  Recent Labs  02/05/15 0253  BNP 283.2*    Lipid Panel     Component Value Date/Time   CHOL 132 12/29/2014 1015   TRIG 115 12/29/2014 1015   HDL 36* 12/29/2014 1015   CHOLHDL 3.7 12/29/2014 1015   VLDL 23  12/29/2014 1015   [place]ife 73 12/29/2014 1015      Imaging:  Dg Chest Port 1 View  02/05/2015   CLINICAL DATA:  Shortness of breath, chest pain  EXAM: PORTABLE CHEST - 1 VIEW  COMPARISON:  06/10/2014  FINDINGS: Borderline cardiomegaly. No acute infiltrate or pulmonary edema. Single lead cardiac pacemaker is unchanged in position. Please note left lung base and left costophrenic angle are not included on the film.  IMPRESSION: No active disease.   Electronically Signed   By: Lahoma Crocker M.D.   On: 02/05/2015 09:28      Assessment/Plan:   1. CAD: s/p complicated PCI on 3/2 for CTO of mid RCA with coronary perforation and cardiac tamponade requiring emergent pericardiocentesis of 690 cc hemorrhagic pericardial fluid and Graftmaster coated stent in cath lab. 3/3 am pericardial drainage had essentially stopped, patient had become more dyspneic and early echo 3/3 revealed recurrent pericardial  effusion with recurrent tamponade findings;the pericardial catheter was flushed multiple times and massaged with return of significant  drainage. Echo subsequent to removal of additional 300 cc pericardial fluid later on 3/3 showed marked reduction in fluid and resolution of tamponade echo features.  Portable bedside echo done at 1am when patient was hypotensive revealed known reduced EF ~30%, collapsing IVC, not dilated and trivial to small pericardial effusion from subcostal view with no RA/RV collapse. Yesterday, about 230 cc fluid removed from drain but flow slowed over day.  Echo 3/4 still showed mild to mod peri eff.   2. S/p emergent pericardiocentesis: 234 cc yesterday, this morning we were able to get out minimal pericardial fluid despite repositioning.  Will try once more later this morning, if still minimal fluid will plan to pull drain. Repeat echo after drain is out.  3. Ischemic cardiomyopathy:  EF 25 - 30% on echo; s/p remote ICD implant.  BP stable, I will restart Coreg at 6.25 mg bid, lisinopril 10 mg daily, and spironolactone 25 daily.  If he does fine on these meds, increase to home doses tomorrow.   4. HLP: pravastatin 40 mg changed to atorvastatin 40 mg for more aggressive treatment  5. Pleuritic chest pain: Resolved. Suspect pericardial rather than re-oclusion. He is on colchicine.   6. Anemia: Hemoglobin stable.   Loralie Champagne  02/06/2015, 7:26 AM   Rapid atrial fibrillation began this morning, HR up to 160s.  I will start him on amiodarone with bolus/gtt to try to acutely control.  I am hesitant about anticoagulating him at this time with recent pericardial tamponade.  Suspect this was triggered by pericardial irritation but has baseline substrate for atrial fibrillation.  Says he has felt similar fluttering briefly in the past.   Loralie Champagne 02/06/2015 8:46 AM  Patient back in NSR on amiodarone.  History of recent tachypalpitations feeling like today's atrial  fibrillation episode.  Suspect he is going to need to be on anticoagulation at some point, will hold off currently with recent bleed into pericardium.  Minimal pericardial drainage today despite aspiration and repositioning.  Drain pulled with no complication.  Will get echo tomorrow to follow pericardial effusion.   Total 45 minutes critical care time.  Loralie Champagne 02/06/2015 1:11 PM

## 2015-02-06 NOTE — Progress Notes (Signed)
CARDIAC REHAB PHASE I   PRE:  Rate/Rhythm: 80 SR  BP:  Supine: 134/49  Sitting:   Standing:     SaO2: 94RA  MODE:  Ambulation: 700 ft   POST:  Rate/Rhythm: 84 SR  BP:  Supine:   Sitting: 155/66  Standing:    SaO2: 95 RA 1500-1540 Assisted X 1 to ambulate. Gait steady. Pt able to walk 700 feet without c/o of pain or SOB. VS stable Pt to recliner after walk with call light in reach. Pt states that it felt good to walk.  Rodney Langton RN 02/06/2015 3:34 PM

## 2015-02-06 NOTE — Progress Notes (Signed)
Pt converted to NSR at 1135.  Dr. Aundra Dubin notified.  Will continue to monitor.

## 2015-02-06 NOTE — Progress Notes (Signed)
EKG CRITICAL VALUE     12 lead EKG performed.  Critical value noted. Bobbe Medico, RN notified.   Melissa Montane, CCT 02/06/2015 8:41 AM

## 2015-02-06 NOTE — Progress Notes (Deleted)
Dr. Aundra Dubin pulled pericardial drain with no issues.  Occlusive dressing applied.  VSS. Will continue to monitor.

## 2015-02-06 NOTE — Progress Notes (Signed)
Pt still in afib HR 130-140.  Unable to aspirate any fluid from pericardial drain.  Dr. Aundra Dubin notified.  Orders received to give 150mg  amiodarone bolus.  Before giving bolus, pt's BP 83/50.  Pt asymptomatic.  Dr. Aundra Dubin notified and orders received to continue to monitor BP and hold bolus until blood pressure returns to normal.  Will continue to monitor closely.

## 2015-02-06 NOTE — Progress Notes (Signed)
Pt HR 160-180, afib.  Pt asymptomatic.  Other VSS. Dr. Aundra Dubin notified.  12 lead ECG obtained and Dr. Aundra Dubin read.  Orders received for amiodarone bolus and drip.  Will continue to monitor.

## 2015-02-07 DIAGNOSIS — I319 Disease of pericardium, unspecified: Secondary | ICD-10-CM

## 2015-02-07 DIAGNOSIS — I4891 Unspecified atrial fibrillation: Secondary | ICD-10-CM

## 2015-02-07 LAB — BASIC METABOLIC PANEL
ANION GAP: 4 — AB (ref 5–15)
BUN: 20 mg/dL (ref 6–23)
CALCIUM: 8.3 mg/dL — AB (ref 8.4–10.5)
CO2: 26 mmol/L (ref 19–32)
Chloride: 106 mmol/L (ref 96–112)
Creatinine, Ser: 1.03 mg/dL (ref 0.50–1.35)
GFR calc non Af Amer: 78 mL/min — ABNORMAL LOW (ref >90)
GLUCOSE: 99 mg/dL (ref 70–99)
POTASSIUM: 4.1 mmol/L (ref 3.5–5.1)
Sodium: 136 mmol/L (ref 135–145)

## 2015-02-07 LAB — CBC
HCT: 31.6 % — ABNORMAL LOW (ref 39.0–52.0)
HEMOGLOBIN: 10.9 g/dL — AB (ref 13.0–17.0)
MCH: 29.8 pg (ref 26.0–34.0)
MCHC: 34.5 g/dL (ref 30.0–36.0)
MCV: 86.3 fL (ref 78.0–100.0)
PLATELETS: 242 10*3/uL (ref 150–400)
RBC: 3.66 MIL/uL — ABNORMAL LOW (ref 4.22–5.81)
RDW: 13.3 % (ref 11.5–15.5)
WBC: 10.7 10*3/uL — ABNORMAL HIGH (ref 4.0–10.5)

## 2015-02-07 MED ORDER — AMIODARONE HCL 200 MG PO TABS
200.0000 mg | ORAL_TABLET | Freq: Two times a day (BID) | ORAL | Status: DC
  Administered 2015-02-07 – 2015-02-08 (×3): 200 mg via ORAL
  Filled 2015-02-07 (×4): qty 1

## 2015-02-07 MED ORDER — CARVEDILOL 3.125 MG PO TABS
3.1250 mg | ORAL_TABLET | Freq: Two times a day (BID) | ORAL | Status: DC
  Administered 2015-02-07 (×2): 3.125 mg via ORAL
  Filled 2015-02-07 (×5): qty 1

## 2015-02-07 MED ORDER — LISINOPRIL 5 MG PO TABS
5.0000 mg | ORAL_TABLET | Freq: Every day | ORAL | Status: DC
  Administered 2015-02-07 – 2015-02-08 (×2): 5 mg via ORAL
  Filled 2015-02-07 (×2): qty 1

## 2015-02-07 NOTE — Progress Notes (Signed)
Patient ID: Kerry Mason, male   DOB: September 23, 1956, 59 y.o.   MRN: [id number]    Subjective:  Pericardial drain pulled yesterday.  Patient had long run of atrial fibrillation with RVR terminated by amiodarone .  This morning, no chest pain.  BP soft, 509T systolic.  Walked already, feels good.   Objective:   Vital Signs : Filed Vitals:   02/07/15 0200 02/07/15 0300 02/07/15 0400 02/07/15 0423  BP: 82/43 101/77 102/71 102/71  Pulse:      Temp:    98.2 F (36.8 C)  TempSrc:    Oral  Resp:    18  Height:      Weight:      SpO2:        Intake/Output from previous day:  Intake/Output Summary (Last 24 hours) at 02/07/15 0726 Last data filed at 02/07/15 0400  Gross per 24 hour  Intake 904.54 ml  Output    462 ml  Net 442.54 ml    Wt Readings from Last 3 Encounters:  02/03/15 199 lb 8.3 oz (90.5 kg)  01/22/15 209 lb (94.802 kg)  12/29/14 207 lb 11.2 oz (94.212 kg)    Medications: . allopurinol  300 mg Oral Daily  . amiodarone  200 mg Oral BID  . antiseptic oral rinse  7 mL Mouth Rinse BID  . aspirin EC  81 mg Oral Daily  . atorvastatin  40 mg Oral q1800  . carvedilol  3.125 mg Oral BID WC  . clopidogrel  75 mg Oral Daily  . colchicine  0.6 mg Oral Daily  . heparin subcutaneous  5,000 Units Subcutaneous 3 times per day  . lisinopril  5 mg Oral Daily  . multivitamin with minerals  1 tablet Oral Daily  . spironolactone  25 mg Oral Daily       Physical Exam:   General appearance: alert, cooperative and appears more comfortable this am Neck: no adenopathy, no carotid bruit, no JVD, supple, symmetrical, trachea midline and thyroid not enlarged, symmetric, no tenderness/mass/nodules Lungs: no wheezing Heart: RRR 1/6 sem; no rub.   Abdomen: soft, non-tender; bowel sounds normal; no masses,  no organomegaly Extremities: no edema, redness or tenderness in the calves or thighs Pulses: 2+ and symmetric Skin: Skin color, texture, turgor normal. No rashes or  lesions Neurologic: Grossly normal   Rhythm: normal sinus rhythm  Lab Results:  BMP Latest Ref Rng 02/07/2015 02/06/2015 02/05/2015  Glucose 70 - 99 mg/dL 99 106(H) 120(H)  BUN 6 - 23 mg/dL 20 13 16   Creatinine 0.50 - 1.35 mg/dL 1.03 0.93 1.09  Sodium 135 - 145 mmol/L 136 136 134(L)  Potassium 3.5 - 5.1 mmol/L 4.1 4.1 4.3  Chloride 96 - 112 mmol/L 106 106 105  CO2 19 - 32 mmol/L 26 22 24   Calcium 8.4 - 10.5 mg/dL 8.3(L) 8.6 8.3(L)     CBC Latest Ref Rng 02/07/2015 02/06/2015 02/05/2015  WBC 4.0 - 10.5 K/uL 10.7(H) 12.5(H) 19.4(H)  Hemoglobin 13.0 - 17.0 g/dL 10.9(L) 11.5(L) 11.5(L)  Hematocrit 39.0 - 52.0 % 31.6(L) 32.9(L) 33.5(L)  Platelets 150 - 400 K/uL 242 223 218      Recent Labs  02/04/15 1000 02/04/15 1431  TROPONINI 0.54* 0.47*    Hepatic Function Panel No results for input(s): PROT, ALBUMIN, AST, ALT, ALKPHOS, BILITOT, BILIDIR, IBILI in the last 72 hours. No results for input(s): INR in the last 72 hours. BNP (last 3 results)  Recent Labs  02/05/15 0253  BNP 283.2*    Lipid  Panel     Component Value Date/Time   CHOL 132 12/29/2014 1015   TRIG 115 12/29/2014 1015   HDL 36* 12/29/2014 1015   CHOLHDL 3.7 12/29/2014 1015   VLDL 23 12/29/2014 1015   LDLCALC 73 12/29/2014 1015      Imaging:  Dg Chest Port 1 View  02/05/2015   CLINICAL DATA:  Shortness of breath, chest pain  EXAM: PORTABLE CHEST - 1 VIEW  COMPARISON:  06/10/2014  FINDINGS: Borderline cardiomegaly. No acute infiltrate or pulmonary edema. Single lead cardiac pacemaker is unchanged in position. Please note left lung base and left costophrenic angle are not included on the film.  IMPRESSION: No active disease.   Electronically Signed   By: Lahoma Crocker M.D.   On: 02/05/2015 09:28      Assessment/Plan:   1. CAD: s/p complicated PCI on 3/2 for CTO of mid RCA with coronary perforation and cardiac tamponade requiring emergent pericardiocentesis of 690 cc hemorrhagic pericardial fluid and Graftmaster  coated stent in cath lab. 3/3 am pericardial drainage had essentially stopped, patient had become more dyspneic and early echo 3/3 revealed recurrent pericardial effusion with recurrent tamponade findings;the pericardial catheter was flushed multiple times and massaged with return of significant  drainage. Echo subsequent to removal of additional 300 cc pericardial fluid later on 3/3 showed marked reduction in fluid and resolution of tamponade echo features.  Echo 3/4 still showed mild to mod peri eff.  On 3/5, I was unable to aspirate pericardial fluid despite repositioning.  Tube was pulled in afternoon.  - Continue ASA, Plavix, statin.   2. S/p emergent pericardiocentesis: Pericardial drain now out as above.  Will repeat limited echo to reassess pericardial effusion today.  3. Ischemic cardiomyopathy:  EF 25 - 30% on echo; s/p remote ICD implant.  BP soft today.  - Decrease Coreg to 3.125 mg bid and lisinopril to 5 daily.  Can titrate these back up over time.   4. HLP: pravastatin 40 mg changed to atorvastatin 40 mg for more aggressive treatment  5. Pleuritic chest pain: Resolved. Suspect pericardial rather than re-oclusion. He is on colchicine.   6. Anemia: Hemoglobin stable.   7. Atrial fibrillation: Paroxysmal.  Patient had long run afib/RVR on 3/5.  Said he'd had a lot of episodes at home that felt like that (tachypalpitations) but shorter in duration.  Suspect the current episode was triggered by pericardial irritation, but based on symptoms he may also have paroxysmal episodes at home.  I started him on amiodarone and he converted to NSR.  I am hesitant about anticoagulating him acutely with recent large pericardial bleed (and he is on ASA/Plavix).  However, eventually I think he needs anticoagulation.  It may be reasonable to wait 10 days or so then start warfarin goal INR 2-2.5. At that time, would stop ASA after a month and continue warfarin/Plavix long-term.   Loralie Champagne  02/07/2015,  7:26 AM

## 2015-02-07 NOTE — Progress Notes (Signed)
02/07/15 1340 nsg To unit per wheelchair accompanied by RN and wife  alert and oriented patient. Telemetry placed . Oriented to unit set up. Needs attended to.

## 2015-02-07 NOTE — Progress Notes (Signed)
Report called to Marjorie Smolder, RN on [place]. Pt and wife aware of room assignment change.

## 2015-02-07 NOTE — Progress Notes (Signed)
  Echocardiogram 2D Echocardiogram has been performed.  Bobbye Charleston 02/07/2015, 11:51 AM

## 2015-02-07 NOTE — Progress Notes (Signed)
Pt taken to [facility]. Attached new telemetry box and new RN at bedside when this RN left. All belongings with pt.

## 2015-02-08 ENCOUNTER — Encounter (HOSPITAL_COMMUNITY): Payer: Self-pay | Admitting: Physician Assistant

## 2015-02-08 ENCOUNTER — Encounter: Payer: Self-pay | Admitting: Internal Medicine

## 2015-02-08 DIAGNOSIS — M109 Gout, unspecified: Secondary | ICD-10-CM | POA: Diagnosis present

## 2015-02-08 DIAGNOSIS — Z9861 Coronary angioplasty status: Secondary | ICD-10-CM

## 2015-02-08 DIAGNOSIS — F419 Anxiety disorder, unspecified: Secondary | ICD-10-CM | POA: Diagnosis present

## 2015-02-08 DIAGNOSIS — I10 Essential (primary) hypertension: Secondary | ICD-10-CM

## 2015-02-08 DIAGNOSIS — I2510 Atherosclerotic heart disease of native coronary artery without angina pectoris: Secondary | ICD-10-CM | POA: Diagnosis present

## 2015-02-08 DIAGNOSIS — I2582 Chronic total occlusion of coronary artery: Secondary | ICD-10-CM

## 2015-02-08 DIAGNOSIS — R931 Abnormal findings on diagnostic imaging of heart and coronary circulation: Secondary | ICD-10-CM

## 2015-02-08 DIAGNOSIS — I314 Cardiac tamponade: Secondary | ICD-10-CM | POA: Diagnosis present

## 2015-02-08 LAB — BASIC METABOLIC PANEL
Anion gap: 6 (ref 5–15)
BUN: 15 mg/dL (ref 6–23)
CO2: 25 mmol/L (ref 19–32)
Calcium: 8.6 mg/dL (ref 8.4–10.5)
Chloride: 108 mmol/L (ref 96–112)
Creatinine, Ser: 0.92 mg/dL (ref 0.50–1.35)
GFR calc Af Amer: >90 mL/min (ref >90)
GFR calc non Af Amer: >90 mL/min (ref >90)
Glucose, Bld: 100 mg/dL — ABNORMAL HIGH (ref 70–99)
POTASSIUM: 3.6 mmol/L (ref 3.5–5.1)
Sodium: 139 mmol/L (ref 135–145)

## 2015-02-08 MED ORDER — LORAZEPAM 1 MG PO TABS: 1.0000 mg | ORAL_TABLET | Freq: Every day | ORAL | Status: DC

## 2015-02-08 MED ORDER — CARVEDILOL 6.25 MG PO TABS: 6.2500 mg | ORAL_TABLET | Freq: Two times a day (BID) | ORAL | Status: DC

## 2015-02-08 MED ORDER — AMIODARONE HCL 200 MG PO TABS: 200.0000 mg | ORAL_TABLET | Freq: Two times a day (BID) | ORAL | Status: DC

## 2015-02-08 MED ORDER — ATORVASTATIN CALCIUM 40 MG PO TABS: 40.0000 mg | ORAL_TABLET | Freq: Every day | ORAL | Status: DC

## 2015-02-08 MED ORDER — COLCHICINE 0.6 MG PO TABS: 0.6000 mg | ORAL_TABLET | Freq: Every day | ORAL | Status: DC

## 2015-02-08 MED ORDER — LISINOPRIL 5 MG PO TABS: 5.0000 mg | ORAL_TABLET | Freq: Every day | ORAL | Status: DC

## 2015-02-08 MED ORDER — CARVEDILOL 6.25 MG PO TABS
6.2500 mg | ORAL_TABLET | Freq: Two times a day (BID) | ORAL | Status: DC
  Filled 2015-02-08 (×2): qty 1

## 2015-02-08 NOTE — Discharge Instructions (Signed)

## 2015-02-08 NOTE — Discharge Summary (Signed)
Discharge Summary   Patient ID: Kerry Mason MRN: [id number], DOB/AGE: 03/28/1956 62 y.o. Admit date: 02/03/2015 D/C date:     02/08/2015  Primary Cardiologist: Dr. Martinique  Principal Problem:   Coronary artery chronic total occlusion Active Problems:   Cardiomyopathy, ischemic   Chronic systolic heart failure   Cardiac tamponade   HTN (hypertension)   Hyperlipidemia   ICD (implantable cardioverter-defibrillator) in place   Coronary artery disease   Pericardial effusion   CAD (coronary artery disease)   Obesity   Anxiety   Gout    Admission Dates: 02/03/15-02/08/15 Discharge Diagnosis:  CAD with complicated PCI on 3/2 for CTO of mid RCA with coronary perforation and cardiac tamponade requiring emergent pericardiocentesis   HPI: Kerry Mason is a 59 y.o. male with a history of CAD s/p remote RCA stenting (2000); STEMI s/p LHC with old occlusion of RCA and DESx2 to LAD (2015), ischemic CM/chronic systolic CHF (EF 65-99%) s/p ICD placement, tobacco abuse (now quit), gout, HLD and HTN who was admitted to [facility] on 02/03/15 for planned CTO PCI of RCA for refarctory angina despite optimal medical therapy.  He has known CAD with remote stenting of the RCA in 2000 and then lost to follow up. Had STEMI in 12/2013 with cath showing occlusion of the proximal RCA with collaterals and occlusion of the proximal LAD. RCA occlusion felt to be chronic. EF 35% by cath and 30% by echo. Proximal and mid to distal LAD were stented with DES. This was associated with acute renal insufficiency that resolved. He had a subsequent ICD implanted. Last month he reported some increased SOB and chest pain. He underwent a myoview study which returned high risk with large area of anterior, septal and apical scar with inferoapical ischemia. EF 29%. He underwent repeat cardiac cath that showed the stents in the LAD were widely patent. The first diagonal had an 80% stenosis. The RCA was occluded proximally with left to right  collaterals. He continued to complain of chest heaviness and burning at times and felt exhausted at the end of the day. His refractory symptoms were felt to be related to his chronic RCA occlusion. His inferior wall appeared viable. Dr. Martinique  recommended CTO PCI of the RCA to try and improve his symptoms and potentially help with his LV dysfunction. This was scheduled for 02/03/15.   Hospital Course  CAD: s/p complicated PCI on 3/2 for CTO of mid RCA with coronary perforation and cardiac tamponade requiring emergent pericardiocentesis of 690 cc hemorrhagic pericardial fluid and Graftmaster coverted stent in cath lab. 3/3 am pericardial drainage had essentially stopped, patient had become more dyspneic and early echo 3/3 revealed recurrent pericardial effusion with recurrent tamponade findings;the pericardial catheter was flushed multiple times and massaged with return of significant drainage. Echo subsequent to removal of additional 300 cc pericardial fluid later on 3/3 showed marked reduction in fluid and resolution of tamponade echo features. Echo 3/4 still showed mild to mod peri eff. On 3/5, unable to aspirate pericardial fluid despite repositioning. Tube was pulled 02/07/15 -- Continue ASA, Plavix, statin and BB  S/p emergent pericardiocentesis: Pericardial drain now out as above. Repeat limited echo on 02/07/15 with resolution of pericardial effusion  Ischemic cardiomyopathy: EF 25-30% on echo; s/p remote ICD implant.Medications reduced due to soft BP. BP better now. Resume lisinopril at lower dose 5 mg daily. Coreg 6.25 mg bid. Aldactone 25 mg daily.  -- May be able to titrate meds as outpatient.   HLD: pravastatin  40 mg changed to atorvastatin 40 mg for more aggressive treatment  Pleuritic chest pain: Resolved. He is on colchicine. Will continue 4-6 weeks. ( he is on this for gout flares but knows to take this for 4-6 weeks for inflammation around his heart.)  Anemia: Hemoglobin stable at  10.9/31.6  Atrial fibrillation: Paroxysmal. Patient had long run afib/RVR on 3/5.This is clearly related to pericarditis post perforation/hemmorhage. Will continue amiodarone for now. Dr. Martinique has recommended no anticoagulation given recent perforation. Dr. Martinique reviewed ICD and he has no history of Afib in the past. As inflammation settles down his rhythm issues will hopefully resolve.   Sleep disturbance- He is requesting ativan for sleep. He does appear to have a lot of anxiety. I will give him a 1 month Rx for ativan   The patient has had an uncomplicated hospital course and is recovering well. The femoral catheter site is stable. He has been seen by Dr. Martinique today and deemed ready for discharge home. All follow-up appointments have been scheduled.  Discharge medications are listed below.   Discharge Vitals: Blood pressure 108/71, pulse 78, temperature 97.8 F (36.6 C), temperature source Oral, resp. rate 19, height 6\' 1"  (1.854 m), weight 197 lb 5.8 oz (89.522 kg), SpO2 98 %.  Labs: Lab Results  Component Value Date   WBC 10.7* 02/07/2015   HGB 10.9* 02/07/2015   HCT 31.6* 02/07/2015   MCV 86.3 02/07/2015   PLT 242 02/07/2015     Recent Labs Lab 02/08/15 0555  NA 139  K 3.6  CL 108  CO2 25  BUN 15  CREATININE 0.92  CALCIUM 8.6  GLUCOSE 100*    Lab Results  Component Value Date   CHOL 132 12/29/2014   HDL 36* 12/29/2014   LDLCALC 73 12/29/2014   TRIG 115 12/29/2014    Diagnostic Studies/Procedures   Dg Chest Port 1 View  02/05/2015   CLINICAL DATA:  Shortness of breath, chest pain  EXAM: PORTABLE CHEST - 1 VIEW  COMPARISON:  06/10/2014  FINDINGS: Borderline cardiomegaly. No acute infiltrate or pulmonary edema. Single lead cardiac pacemaker is unchanged in position. Please note left lung base and left costophrenic angle are not included on the film.  IMPRESSION: No active disease.   Electronically Signed   By: Lahoma Crocker M.D.   On: 02/05/2015 09:28     CARDIAC CATH NOTE 02/03/15 Indication: 59 yo WM with CAD, ischemic cardiomyopathy and refractory angina despite optimal medical therapy. High risk Myoview. Cardiac cath showed CTO of the proximal RCA with collaterals.   At the end of procedure the patient complained of a sense of unease. He became hypotensive and sweaty. Stat Echo revealed a large pericardial effusion due to the perforation. He had a narrow pulse pressure with pulsus paradoxus. We proceeded with emergent pericardiocentesis.  Lesion Data: Vessel: RCA  Percent stenosis (pre): 100%  TIMI-flow (pre): 0 Stent: 4.0 x 20, 3.5 x 38 mm, 3.0 x 28 mm, and 2.5 x 38 mm Synergy stents. 3.5 x 19 mm Graftmaster stent.  Percent stenosis (post): 0% TIMI-flow (post): 3  Conclusions: Successful CTO PCI of the RCA. Procedure complicated by coronary perforation with resultant pericardial tamponade. The perforation was sealed with a Graftmaster coated stent. Successful pericardiocentesis.   Recommendations: Will observe in ICU overnight with pericardial drain in place. Repeat Echo in am. If no significant residual bleeding drain could be removed in am. Continue DAPT with ASA and Plavix indefinitely.    2D ECHO limited: 02/07/2015  LV EF: 25% -30% Study Conclusions - Left ventricle: The cavity size was normal. Wall thickness was   increased in a pattern of moderate LVH. Systolic function was   severely reduced. The estimated ejection fraction was in the   range of 25% to 30%. There is akinesis of the anteroseptal and   apical myocardium. - Aortic root: The aortic root was mildly dilated. - Mitral valve: There was mild regurgitation. - Left atrium: The atrium was mildly dilated. - Right ventricle: Systolic function was mildly reduced. Impressions: - Compared to 02/05/15, pericardial effusion now resolved.   Discharge Medications     Medication List    STOP taking these medications        pravastatin 40 MG tablet  Commonly  known as:  PRAVACHOL      TAKE these medications        allopurinol 300 MG tablet  Commonly known as:  ZYLOPRIM  Take 1 tablet (300 mg total) by mouth daily.     amiodarone 200 MG tablet  Commonly known as:  PACERONE  Take 1 tablet (200 mg total) by mouth 2 (two) times daily.     aspirin EC 81 MG tablet  Take 81 mg by mouth daily.     atorvastatin 40 MG tablet  Commonly known as:  LIPITOR  Take 1 tablet (40 mg total) by mouth daily at 6 PM.     carvedilol 6.25 MG tablet  Commonly known as:  COREG  Take 1 tablet (6.25 mg total) by mouth 2 (two) times daily.     clopidogrel 75 MG tablet  Commonly known as:  PLAVIX  Take 1 tablet (75 mg total) by mouth daily.     colchicine 0.6 MG tablet  Commonly known as:  COLCRYS  Take 1 tablet (0.6 mg total) by mouth daily as needed (gout flare ups).     colchicine 0.6 MG tablet  Take 1 tablet (0.6 mg total) by mouth daily.     Fluocinonide 0.1 % Crea  Apply 1 application topically 3 (three) times daily as needed (rash - on legs, hands and bottom).     furosemide 20 MG tablet  Commonly known as:  LASIX  Take 1 tablet (20 mg total) by mouth daily. To take "as needed" for weight gain of 2 to 3 pounds overnight     lisinopril 5 MG tablet  Commonly known as:  PRINIVIL,ZESTRIL  Take 1 tablet (5 mg total) by mouth daily.     LORazepam 1 MG tablet  Commonly known as:  ATIVAN  Take 1 tablet (1 mg total) by mouth at bedtime.     multivitamin with minerals Tabs tablet  Take 1 tablet by mouth daily.     nitroGLYCERIN 0.4 MG SL tablet  Commonly known as:  NITROSTAT  Place 1 tablet (0.4 mg total) under the tongue every 5 (five) minutes as needed for chest pain.     spironolactone 25 MG tablet  Commonly known as:  ALDACTONE  Take 1 tablet (25 mg total) by mouth at bedtime.     triamcinolone cream 0.1 %  Commonly known as:  KENALOG  Apply 1 application topically 5 (five) times daily as needed (rash on legs).        Disposition    The patient will be discharged in stable condition to home.  Follow-up Information    Follow up with Peter Martinique, MD On 02/17/2015.   Specialty:  Cardiology   Why:  @ 2:30pm  Contact information:   [place] [address] [phone]         Duration of Discharge Encounter: Greater than 30 minutes including physician and PA time.  Signed, Grandville Silos, Abella Shugart R PA-C 02/08/2015, 10:02 AM

## 2015-02-08 NOTE — Progress Notes (Signed)
Patient ID: Kerry Mason, male   DOB: [birth year], 59 y.o.   MRN: [id number]    Subjective:  Feels very well this am. No chest pain or SOB. Ambulating well in the halls. He had a brief episode of Afib yesterday 1 pm but has otherwise maintained NSR.  Objective:   Vital Signs : Filed Vitals:   02/07/15 1200 02/07/15 1340 02/07/15 2131 02/08/15 0653  BP: 117/72 119/69 133/70 108/71  Pulse:  81 85 78  Temp: 98 F (36.7 C) 98.5 F (36.9 C) 98.4 F (36.9 C) 97.8 F (36.6 C)  TempSrc: Oral Oral Oral Oral  Resp: 20 20 18 19   Height:  6\' 1"  (1.854 m)    Weight:  200 lb 6.4 oz (90.9 kg)  197 lb 5.8 oz (89.522 kg)  SpO2: 96% 97% 100% 98%    Intake/Output from previous day:  Intake/Output Summary (Last 24 hours) at 02/08/15 0850 Last data filed at 02/08/15 0656  Gross per 24 hour  Intake  976.7 ml  Output   1025 ml  Net  -48.3 ml    Wt Readings from Last 3 Encounters:  02/08/15 197 lb 5.8 oz (89.522 kg)  01/22/15 209 lb (94.802 kg)  12/29/14 207 lb 11.2 oz (94.212 kg)    Medications: . allopurinol  300 mg Oral Daily  . amiodarone  200 mg Oral BID  . aspirin EC  81 mg Oral Daily  . atorvastatin  40 mg Oral q1800  . carvedilol  6.25 mg Oral BID WC  . clopidogrel  75 mg Oral Daily  . colchicine  0.6 mg Oral Daily  . heparin subcutaneous  5,000 Units Subcutaneous 3 times per day  . lisinopril  5 mg Oral Daily  . multivitamin with minerals  1 tablet Oral Daily  . spironolactone  25 mg Oral Daily       Physical Exam:   General appearance: alert, cooperative and appears more comfortable this am Neck: no adenopathy, no carotid bruit, no JVD, supple, symmetrical, trachea midline and thyroid not enlarged, symmetric, no tenderness/mass/nodules Lungs: no wheezing Heart: RRR 1/6 sem; no rub.  Subxyphoid area without drainage or redness.  Abdomen: soft, non-tender; bowel sounds normal; no masses,  no organomegaly Extremities: no edema, redness or tenderness in the calves or  thighs Pulses: 2+ and symmetric Skin: Skin color, texture, turgor normal. No rashes or lesions Neurologic: Grossly normal   Rhythm: normal sinus rhythm  Lab Results:  BMP Latest Ref Rng 02/08/2015 02/07/2015 02/06/2015  Glucose 70 - 99 mg/dL 100(H) 99 106(H)  BUN 6 - 23 mg/dL 15 20 13   Creatinine 0.50 - 1.35 mg/dL 0.92 1.03 0.93  Sodium 135 - 145 mmol/L 139 136 136  Potassium 3.5 - 5.1 mmol/L 3.6 4.1 4.1  Chloride 96 - 112 mmol/L 108 106 106  CO2 19 - 32 mmol/L 25 26 22   Calcium 8.4 - 10.5 mg/dL 8.6 8.3(L) 8.6     CBC Latest Ref Rng 02/07/2015 02/06/2015 02/05/2015  WBC 4.0 - 10.5 K/uL 10.7(H) 12.5(H) 19.4(H)  Hemoglobin 13.0 - 17.0 g/dL 10.9(L) 11.5(L) 11.5(L)  Hematocrit 39.0 - 52.0 % 31.6(L) 32.9(L) 33.5(L)  Platelets 150 - 400 K/uL 242 223 218     No results for input(s): TROPONINI in the last 72 hours.  Invalid input(s): CK, MB  Hepatic Function Panel No results for input(s): PROT, ALBUMIN, AST, ALT, ALKPHOS, BILITOT, BILIDIR, IBILI in the last 72 hours. No results for input(s): INR in the last 72 hours. BNP (last 3  results)  Recent Labs  02/05/15 0253  BNP 283.2*    Lipid Panel     Component Value Date/Time   CHOL 132 12/29/2014 1015   TRIG 115 12/29/2014 1015   HDL 36* 12/29/2014 1015   CHOLHDL 3.7 12/29/2014 1015   VLDL 23 12/29/2014 1015   LDLCALC 73 12/29/2014 1015      Imaging:  No results found.    Assessment/Plan:   1. CAD: s/p complicated PCI on 3/2 for CTO of mid RCA with coronary perforation and cardiac tamponade requiring emergent pericardiocentesis of 690 cc hemorrhagic pericardial fluid and Graftmaster coated stent in cath lab. 3/3 am pericardial drainage had essentially stopped, patient had become more dyspneic and early echo 3/3 revealed recurrent pericardial effusion with recurrent tamponade findings;the pericardial catheter was flushed multiple times and massaged with return of significant  drainage. Echo subsequent to removal of additional  300 cc pericardial fluid later on 3/3 showed marked reduction in fluid and resolution of tamponade echo features.  Echo 3/4 still showed mild to mod peri eff.  On 3/5, I was unable to aspirate pericardial fluid despite repositioning.  Tube was pulled in afternoon.  - Continue ASA, Plavix, statin.   2. S/p emergent pericardiocentesis: Pericardial drain now out as above.  Repeat Echo showed resolution of effusion.  3. Ischemic cardiomyopathy:  EF 25 - 30% on echo; s/p remote ICD implant.  Medications reduced due to soft BP. BP better now. Resume lisinopril at lower dose 5 mg daily. Coreg 6.25 mg bid. Aldactone 25 mg daily. May be able to titrate meds as outpatient.   4. HLP: pravastatin 40 mg changed to atorvastatin 40 mg for more aggressive treatment  5. Pleuritic chest pain: Resolved.  He is on colchicine. Will continue 4-6 weeks.  6. Anemia: Hemoglobin stable.   7. Atrial fibrillation: Paroxysmal.  This is clearly related to pericarditis post perforation/hemmorhage. Will continue amiodarone. I would no recommend anticoagulation given recent perforation. I reviewed ICD and he has no history of Afib in the past. As inflammation settles down I think rhythm issues will resolve.   Mr. Coberly is stable for DC today. I would like to see in follow up in 1-2 weeks. He will need to be worked into my schedule.   Peter Martinique  02/08/2015, 8:50 AM

## 2015-02-09 ENCOUNTER — Telehealth: Payer: Self-pay | Admitting: Cardiology

## 2015-02-09 NOTE — Telephone Encounter (Signed)
Need DEA Number for authorizating physician for [employer]She is writing prescription for patient for Ativan.

## 2015-02-09 NOTE — Telephone Encounter (Signed)
Left this information w/ pharmacy. May call back for NPI number but I did not have this immediately accessible. Will leave encounter open until EOB.

## 2015-02-17 ENCOUNTER — Encounter: Payer: Self-pay | Admitting: Cardiology

## 2015-02-17 ENCOUNTER — Ambulatory Visit (INDEPENDENT_AMBULATORY_CARE_PROVIDER_SITE_OTHER): Payer: Medicaid Other | Admitting: Cardiology

## 2015-02-17 VITALS — BP 110/72 | HR 79 | Ht 73.0 in | Wt 202.0 lb

## 2015-02-17 DIAGNOSIS — I2510 Atherosclerotic heart disease of native coronary artery without angina pectoris: Secondary | ICD-10-CM

## 2015-02-17 DIAGNOSIS — I2582 Chronic total occlusion of coronary artery: Secondary | ICD-10-CM

## 2015-02-17 DIAGNOSIS — I255 Ischemic cardiomyopathy: Secondary | ICD-10-CM

## 2015-02-17 DIAGNOSIS — I480 Paroxysmal atrial fibrillation: Secondary | ICD-10-CM

## 2015-02-17 DIAGNOSIS — I5022 Chronic systolic (congestive) heart failure: Secondary | ICD-10-CM

## 2015-02-17 DIAGNOSIS — I314 Cardiac tamponade: Secondary | ICD-10-CM

## 2015-02-17 MED ORDER — AMIODARONE HCL 200 MG PO TABS: 200.0000 mg | ORAL_TABLET | Freq: Every day | ORAL | Status: DC

## 2015-02-17 NOTE — Patient Instructions (Signed)
Reduce amiodarone to 200 mg daily  You may stop colchicine on April 7.  Continue your other therapy  I will see you in one month.   You may resume cardiac Rehab

## 2015-02-18 DIAGNOSIS — I480 Paroxysmal atrial fibrillation: Secondary | ICD-10-CM | POA: Insufficient documentation

## 2015-02-18 NOTE — Progress Notes (Signed)
Kerry Mason Date of Birth: 12/03/1956 Medical Record #[id number]  History of Present Illness: [name] is seen back today for follow up s/p complex CTO PCI.  He has known CAD with remote stenting of the RCA in 2000 and then lost to follow up. Had STEMI in January of 2015 - with cath showing occlusion of the proximal RCA with collaterals and occlusion of the proximal LAD. RCA occlusion felt to be chronic. EF 35% by cath and 30% by echo. Proximal and mid to distal LAD were stented with DES.  He  had a subsequent ICD implanted. Other issues include tobacco abuse- now stopped, gout, HLD, and HTN. In December 2105 he reported some increased SOB and chest pain. Myoview study was high risk with large area of anterior, septal and apical scar with inferoapical ischemia. EF 29%. He underwent repeat cardiac cath that showed the stents in the LAD were widely patent. The first diagonal had an 80% stenosis. The RCA was occluded proximally with left to right collaterals. Due to his refractory anginal symptoms he underwent CTO PCI on 02/03/15. His procedure was successful with excellent angiographic result but he did have a complication of perforation of the mid RCA. This resulted in cardiac tamponade and required emergent pericardiocentesis with removal of about a liter of blood. The perforation was sealed with a Graftmaster covered stent in the mid RCA. The remainder of the RCA was stented with 4 additional DES stents. He continued to have some drainage from the pericardium which became serous without recurrent bleeding. He had pericardial pain and evidence of pericarditis by Ecg. The drainage tapered off and Echo showed resolution of the effusion. The drain was removed on day 4. He also developed atrial fibrillation with RVR and converted to NSR on amiodarone.  On follow up today he reports he is recovering well. He felt some discomfort in his chest one night while lying down. He reports some dizziness and  weakness but thinks this is gradually getting better. He does note his original symptoms of angina and dyspnea have resolved.    Current Outpatient Prescriptions  Medication Sig Dispense Refill  . allopurinol (ZYLOPRIM) 300 MG tablet Take 1 tablet (300 mg total) by mouth daily. 90 tablet 3  . amiodarone (PACERONE) 200 MG tablet Take 1 tablet (200 mg total) by mouth daily. 60 tablet 1  . aspirin EC 81 MG tablet Take 81 mg by mouth daily.    Marland Kitchen atorvastatin (LIPITOR) 40 MG tablet Take 1 tablet (40 mg total) by mouth daily at 6 PM. 30 tablet 11  . carvedilol (COREG) 6.25 MG tablet Take 1 tablet (6.25 mg total) by mouth 2 (two) times daily. 60 tablet 11  . clopidogrel (PLAVIX) 75 MG tablet Take 1 tablet (75 mg total) by mouth daily. 90 tablet 3  . colchicine (COLCRYS) 0.6 MG tablet Take 1 tablet (0.6 mg total) by mouth daily as needed (gout flare ups). 30 tablet 6  . Fluocinonide 0.1 % CREA Apply 1 application topically 3 (three) times daily as needed (rash - on legs, hands and bottom).    . furosemide (LASIX) 20 MG tablet Take 1 tablet (20 mg total) by mouth daily. To take "as needed" for weight gain of 2 to 3 pounds overnight (Patient taking differently: Take 20 mg by mouth daily. ) 90 tablet 3  . lisinopril (PRINIVIL,ZESTRIL) 5 MG tablet Take 1 tablet (5 mg total) by mouth daily. 30 tablet 11  . LORazepam (ATIVAN) 1 MG tablet Take 1  tablet (1 mg total) by mouth at bedtime. 30 tablet 0  . Multiple Vitamin (MULTIVITAMIN WITH MINERALS) TABS tablet Take 1 tablet by mouth daily.    . nitroGLYCERIN (NITROSTAT) 0.4 MG SL tablet Place 1 tablet (0.4 mg total) under the tongue every 5 (five) minutes as needed for chest pain. 25 tablet 11  . spironolactone (ALDACTONE) 25 MG tablet Take 1 tablet (25 mg total) by mouth at bedtime. 90 tablet 3  . triamcinolone cream (KENALOG) 0.1 % Apply 1 application topically 5 (five) times daily as needed (rash on legs).     No current facility-administered medications for  this visit.    No Known Allergies  Past Medical History  Diagnosis Date  . CAD (coronary artery disease)     a. 2000: s/p stent of RCA 2000 with BMS  b. 2015: STEMI s/p LHC with old occlusion of RCA and DESx2 to LAD  c. 4/0/98: complicated PCI on 3/2 for CTO of mid RCA with coronary perforation and cardiac tamponade requiring emergent pericardiocentesis     . Tobacco abuse   . Obesity   . HTN (hypertension)   . Hyperlipidemia   . Acute systolic CHF (congestive heart failure)   . Ischemic cardiomyopathy     a. 2D ECHO: EF 25-30%. Akinesis of the anteroseptal and  . AICD (automatic cardioverter/defibrillator) present   . Anxiety   . Cardiac tamponade     a. 02/03/15 2/2 coronary perforation during CTO procedure. Sealed with graftmaster coated stent.   . Gout     Past Surgical History  Procedure Laterality Date  . Cardiac catheterization    . Coronary angioplasty  2000  . Coronary angioplasty with stent placement  2015  . Left heart catheterization with coronary angiogram N/A 12/11/2013    Procedure: LEFT HEART CATHETERIZATION WITH CORONARY ANGIOGRAM;  Surgeon: Amirra Herling M Martinique, MD;  Location: [facility] CATH LAB;  Service: Cardiovascular;  Laterality: N/A;  . Percutaneous coronary stent intervention (pci-s)  12/11/2013    Procedure: PERCUTANEOUS CORONARY STENT INTERVENTION (PCI-S);  Surgeon: Sherrel Ploch M Martinique, MD;  Location: [facility] CATH LAB;  Service: Cardiovascular;;  prov LAD and mid LAD  . Implantable cardioverter defibrillator implant N/A 04/17/2014    Procedure: IMPLANTABLE CARDIOVERTER DEFIBRILLATOR IMPLANT;  Surgeon: Evans Lance, MD;  Location: [facility] CATH LAB;  Service: Cardiovascular;  Laterality: N/A;  . Left heart catheterization with coronary angiogram N/A 01/05/2015    Procedure: LEFT HEART CATHETERIZATION WITH CORONARY ANGIOGRAM;  Surgeon: Welda Azzarello M Martinique, MD;  Location: [facility] CATH LAB;  Service: Cardiovascular;  Laterality: N/A;  . Percutaneous coronary stent intervention (pci-s) N/A 02/03/2015     Procedure: PERCUTANEOUS CORONARY STENT INTERVENTION (PCI-S);  Surgeon: Jettie Booze, MD;  Location: [facility] CATH LAB;  Service: Cardiovascular;  Laterality: N/A;    History  Smoking status  . Former Smoker -- 1.00 packs/day for 35 years  . Types: Cigarettes  . Quit date: 12/11/2013  Smokeless tobacco  . Not on file    History  Alcohol Use No    Family History  Problem Relation Age of Onset  . CAD Father     PTCA    Review of Systems: The review of systems is per the HPI.  All other systems were reviewed and are negative.  Physical Exam: BP 110/72 mmHg  Pulse 79  Ht 6\' 1"  (1.854 m)  Wt 202 lb (91.627 kg)  BMI 26.66 kg/m2 Patient is very pleasant and in no acute distress. Skin is warm and dry. Color is  normal.  HEENT is unremarkable. Normocephalic/atraumatic. PERRL. Sclera are nonicteric. Neck is supple. No masses. No JVD. ICD site has healed well. Lungs are clear. Cardiac exam shows a regular rate and rhythm. normal S1-2. No gallop or murmur. Abdomen is soft. Extremities are without edema. Gait and ROM are intact. No gross neurologic deficits noted.  Wt Readings from Last 3 Encounters:  02/17/15 202 lb (91.627 kg)  02/08/15 197 lb 5.8 oz (89.522 kg)  01/22/15 209 lb (94.802 kg)    LABORATORY DATA/PROCEDURES:  Lab Results  Component Value Date   WBC 10.7* 02/07/2015   HGB 10.9* 02/07/2015   HCT 31.6* 02/07/2015   PLT 242 02/07/2015   GLUCOSE 100* 02/08/2015   CHOL 132 12/29/2014   TRIG 115 12/29/2014   HDL 36* 12/29/2014   LDLCALC 73 12/29/2014   ALT 10 12/29/2014   AST 16 12/29/2014   NA 139 02/08/2015   K 3.6 02/08/2015   CL 108 02/08/2015   CREATININE 0.92 02/08/2015   BUN 15 02/08/2015   CO2 25 02/08/2015   TSH 1.265 12/11/2013   INR 1.07 01/29/2015   HGBA1C 5.8* 12/11/2013    BNP (last 3 results)  Recent Labs  06/10/14 1349  PROBNP 474.9*   Lab Results  Component Value Date   TROPONINI 0.47* 02/04/2015    Ecg today shows NSR with rate  79. Right axis, old anteroseptal MI. Inferolateral T wave inversion. Pericarditis changes have improved. I have personally reviewed and interpreted this study.   Assessment / Plan: 1. CAD s/p anterior STEMI with extensive LAD stenting. Chronic occlusion of RCA. Now s/p successful CTO PCI of the RCA with multiple DES and Graftmaster stent for perforation. Clinically angina and dyspnea improved. Will continue ASA and Plavix indefinitely. Recommend Cardiac Rehab.   2. S/p coronary perforation with hemopericardium and cardiac tamponade. S/p successful pericardiocentesis and drain. Resolved. Pericarditis due to irritation of blood. Improved. Will continue colchicine for one month then discontinue.   3. Paroxysmal Afib due to acute pericarditis. Resolved on amiodarone. Will reduce dose to 200 mg daily. I would not recommend anticoagulation since I think his risk of Afib long term is low and related to acute insult. He is on DAPT chronically. Prior ICD checks show no evidence of afib and we can monitor this by ICD in the future.   4. Chronic Systolic heart failure -EF 30%.  Will continue current Coreg, lisinopril, and aldactone Rx. Recommend he take lasix 20 mg daily. His doses of coreg and lisinopril were reduced in the hospital due to hypotension and we will continue current doses for now. May consider titration as he recovers.   5. Underlying ICD - palpitations - continue beta blocker. No significant arrhythmia noted on ICD interrogation in Nov.  6. Hyperlipidemia. Well controlled on Pravachol.   7. Tobacco abuse- now quit.

## 2015-02-23 ENCOUNTER — Ambulatory Visit: Payer: BC Managed Care – PPO | Admitting: Cardiology

## 2015-02-25 ENCOUNTER — Ambulatory Visit: Payer: Medicaid Other | Admitting: Cardiology

## 2015-03-08 ENCOUNTER — Telehealth: Payer: Self-pay | Admitting: Cardiology

## 2015-03-08 NOTE — Telephone Encounter (Signed)
Pt had atrial Fib last night. Pt have not been feeling good for the past 2 days. He wants to know how long should he wait before going to the hospital,when he have atrial fib episode. Please call to advise

## 2015-03-08 NOTE — Telephone Encounter (Signed)
Spoke to pt.  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  He reports Sat, felt a little lack of energy, continuing through yesterday.  Last night, he reports he had woken from a late nap and taken evening meds late.  BPs last night were up and down a bit, 115/71 high, 86/55 low. Pulse betw 91-95. He reports checking these about every 5 mins.  Reports "feeling great" this AM. BP 123/80. Pulse 77.   Advised no reason to check BP so frequently, gave teaching about meds when missing doses or taking late. Advised when to seek ER treatment.   Pt sched to be seen for 4 week f/u next Monday. Advised him to keep this appt, will route to Dr. Martinique - if any addtl instructions for patient, will communicate these.  Pt voiced agreement w/ plan.

## 2015-03-11 ENCOUNTER — Encounter (HOSPITAL_COMMUNITY)
Admission: RE | Admit: 2015-03-11 | Discharge: 2015-03-11 | Disposition: A | Discharge status: Home / Self Care | Payer: Medicaid Other | Source: Ambulatory Visit | Attending: Cardiology | Admitting: Cardiology

## 2015-03-11 DIAGNOSIS — I4891 Unspecified atrial fibrillation: Secondary | ICD-10-CM | POA: Insufficient documentation

## 2015-03-11 DIAGNOSIS — I314 Cardiac tamponade: Secondary | ICD-10-CM | POA: Insufficient documentation

## 2015-03-11 DIAGNOSIS — Z955 Presence of coronary angioplasty implant and graft: Secondary | ICD-10-CM | POA: Insufficient documentation

## 2015-03-11 NOTE — Progress Notes (Signed)
Cardiac Rehab Medication Review by a Pharmacist  Does the patient  feel that his/her medications are working for him/her?  yes  Has the patient been experiencing any side effects to the medications prescribed?  no  Does the patient measure his/her own blood pressure or blood glucose at home?  yes   Does the patient have any problems obtaining medications due to transportation or finances?   no  Understanding of regimen: excellent Understanding of indications: excellent Potential of compliance: good    Pharmacist comments: Pt has a good understanding of his medicines, indications, and even told me the strengths and frequency of each.  I reviewed with him the appropriate use of SL NTG and answered all of his questions.   Drucie Opitz, PharmD Clinical Pharmacy Resident Pager: [phone] 03/11/2015 8:18 AM

## 2015-03-15 ENCOUNTER — Ambulatory Visit (INDEPENDENT_AMBULATORY_CARE_PROVIDER_SITE_OTHER): Payer: Medicaid Other | Admitting: Cardiology

## 2015-03-15 ENCOUNTER — Encounter: Payer: Self-pay | Admitting: Cardiology

## 2015-03-15 VITALS — BP 118/78 | HR 78 | Ht 73.0 in | Wt 202.2 lb

## 2015-03-15 DIAGNOSIS — I10 Essential (primary) hypertension: Secondary | ICD-10-CM

## 2015-03-15 DIAGNOSIS — Z95810 Presence of automatic (implantable) cardiac defibrillator: Secondary | ICD-10-CM

## 2015-03-15 DIAGNOSIS — I2510 Atherosclerotic heart disease of native coronary artery without angina pectoris: Secondary | ICD-10-CM | POA: Diagnosis not present

## 2015-03-15 DIAGNOSIS — I480 Paroxysmal atrial fibrillation: Secondary | ICD-10-CM

## 2015-03-15 DIAGNOSIS — I5022 Chronic systolic (congestive) heart failure: Secondary | ICD-10-CM | POA: Diagnosis not present

## 2015-03-15 NOTE — Progress Notes (Signed)
Kerry Mason Date of Birth: Apr 26, 1956 Medical Record #[id number]  History of Present Illness: Kerry Mason is seen back today for follow up CAD.  He has known CAD with remote stenting of the RCA in 2000 and then lost to follow up. Had anterior STEMI in January of 2015 - with cath showing occlusion of the proximal RCA with collaterals and occlusion of the proximal LAD. RCA occlusion felt to be chronic. EF 35% by cath and 30% by echo. Proximal and mid to distal LAD were stented with DES.  He  had a subsequent ICD implanted. Other issues include tobacco abuse- now stopped, gout, HLD, and HTN. In December 2105 he reported some increased SOB and chest pain. Myoview study was high risk with large area of anterior, septal and apical scar with inferoapical ischemia. EF 29%. He underwent repeat cardiac cath that showed the stents in the LAD were widely patent. The first diagonal had an 80% stenosis. The RCA was occluded proximally with left to right collaterals. Due to his refractory anginal symptoms he underwent CTO PCI on 02/03/15. His procedure was successful with excellent angiographic result but he did have a complication of perforation of the mid RCA. This resulted in cardiac tamponade and required emergent pericardiocentesis with removal of about a liter of blood. The perforation was sealed with a Graftmaster covered stent in the mid RCA. The remainder of the RCA was stented with 4 additional DES stents. He continued to have some drainage from the pericardium which became serous without recurrent bleeding. He had pericardial pain and evidence of pericarditis by Ecg. The drainage tapered off and Echo showed resolution of the effusion. The drain was removed on day 4. He also developed atrial fibrillation with RVR and converted to NSR on amiodarone.  On follow up today he is doing well. He is working part time and is scheduled to start Cardiac Rehab tomorrow. He feels his energy and strength are improving. One  week ago he felt he was in atrial fibrillation for about 2 hours and BP was low during this time. No other symptoms. When he thought he was in AFib his HR never got over 95 bpm.   Current Outpatient Prescriptions  Medication Sig Dispense Refill  . allopurinol (ZYLOPRIM) 300 MG tablet Take 1 tablet (300 mg total) by mouth daily. 90 tablet 3  . amiodarone (PACERONE) 200 MG tablet Take 1 tablet (200 mg total) by mouth daily. 60 tablet 1  . aspirin EC 81 MG tablet Take 81 mg by mouth daily.    Marland Kitchen atorvastatin (LIPITOR) 40 MG tablet Take 1 tablet (40 mg total) by mouth daily at 6 PM. 30 tablet 11  . carvedilol (COREG) 6.25 MG tablet Take 1 tablet (6.25 mg total) by mouth 2 (two) times daily. 60 tablet 11  . clopidogrel (PLAVIX) 75 MG tablet Take 1 tablet (75 mg total) by mouth daily. 90 tablet 3  . Fluocinonide 0.1 % CREA Apply 1 application topically 3 (three) times daily as needed (rash - on legs, hands and bottom).    . furosemide (LASIX) 20 MG tablet Take 20 mg by mouth daily.    Marland Kitchen lisinopril (PRINIVIL,ZESTRIL) 5 MG tablet Take 1 tablet (5 mg total) by mouth daily. 30 tablet 11  . Multiple Vitamin (MULTIVITAMIN WITH MINERALS) TABS tablet Take 1 tablet by mouth daily.    . nitroGLYCERIN (NITROSTAT) 0.4 MG SL tablet Place 1 tablet (0.4 mg total) under the tongue every 5 (five) minutes as needed for chest pain.  25 tablet 11  . spironolactone (ALDACTONE) 25 MG tablet Take 1 tablet (25 mg total) by mouth at bedtime. 90 tablet 3  . triamcinolone cream (KENALOG) 0.1 % Apply 1 application topically 5 (five) times daily as needed (rash on legs).     No current facility-administered medications for this visit.    No Known Allergies  Past Medical History  Diagnosis Date  . CAD (coronary artery disease)     a. 2000: s/p stent of RCA 2000 with BMS  b. 2015: STEMI s/p LHC with old occlusion of RCA and DESx2 to LAD  c. 12/12/27: complicated PCI on 3/2 for CTO of mid RCA with coronary perforation and cardiac  tamponade requiring emergent pericardiocentesis     . Tobacco abuse   . Obesity   . HTN (hypertension)   . Hyperlipidemia   . Acute systolic CHF (congestive heart failure)   . Ischemic cardiomyopathy     a. 2D ECHO: EF 25-30%. Akinesis of the anteroseptal and  . AICD (automatic cardioverter/defibrillator) present   . Anxiety   . Cardiac tamponade     a. 02/03/15 2/2 coronary perforation during CTO procedure. Sealed with graftmaster coated stent.   . Gout     Past Surgical History  Procedure Laterality Date  . Cardiac catheterization    . Coronary angioplasty  2000  . Coronary angioplasty with stent placement  2015  . Left heart catheterization with coronary angiogram N/A 12/11/2013    Procedure: LEFT HEART CATHETERIZATION WITH CORONARY ANGIOGRAM;  Surgeon: Juvencio Verdi M Martinique, MD;  Location: [facility] CATH LAB;  Service: Cardiovascular;  Laterality: N/A;  . Percutaneous coronary stent intervention (pci-s)  12/11/2013    Procedure: PERCUTANEOUS CORONARY STENT INTERVENTION (PCI-S);  Surgeon: Sumaiyah Markert M Martinique, MD;  Location: [facility] CATH LAB;  Service: Cardiovascular;;  prov LAD and mid LAD  . Implantable cardioverter defibrillator implant N/A 04/17/2014    Procedure: IMPLANTABLE CARDIOVERTER DEFIBRILLATOR IMPLANT;  Surgeon: Evans Lance, MD;  Location: [facility] CATH LAB;  Service: Cardiovascular;  Laterality: N/A;  . Left heart catheterization with coronary angiogram N/A 01/05/2015    Procedure: LEFT HEART CATHETERIZATION WITH CORONARY ANGIOGRAM;  Surgeon: Aarya Quebedeaux M Martinique, MD;  Location: [facility] CATH LAB;  Service: Cardiovascular;  Laterality: N/A;  . Percutaneous coronary stent intervention (pci-s) N/A 02/03/2015    Procedure: PERCUTANEOUS CORONARY STENT INTERVENTION (PCI-S);  Surgeon: Jettie Booze, MD;  Location: [facility] CATH LAB;  Service: Cardiovascular;  Laterality: N/A;    History  Smoking status  . Former Smoker -- 1.00 packs/day for 35 years  . Types: Cigarettes  . Quit date: 12/11/2013  Smokeless tobacco  .  Not on file    History  Alcohol Use No    Family History  Problem Relation Age of Onset  . CAD Father     PTCA    Review of Systems: The review of systems is per the HPI.  All other systems were reviewed and are negative.  Physical Exam: BP 118/78 mmHg  Pulse 78  Ht 6\' 1"  (1.854 m)  Wt 202 lb 3.2 oz (91.717 kg)  BMI 26.68 kg/m2 Patient is very pleasant and in no acute distress. Skin is warm and dry. Color is normal.  HEENT is unremarkable. Normocephalic/atraumatic. PERRL. Sclera are nonicteric. Neck is supple. No masses. No JVD. ICD site has healed well. Lungs are clear. Cardiac exam shows a regular rate and rhythm. normal S1-2. No gallop or murmur. Abdomen is soft. Extremities are without edema. Gait and ROM are intact. No  gross neurologic deficits noted.  Wt Readings from Last 3 Encounters:  03/15/15 202 lb 3.2 oz (91.717 kg)  03/11/15 202 lb 13.2 oz (92 kg)  02/17/15 202 lb (91.627 kg)    LABORATORY DATA/PROCEDURES:  Lab Results  Component Value Date   WBC 10.7* 02/07/2015   HGB 10.9* 02/07/2015   HCT 31.6* 02/07/2015   PLT 242 02/07/2015   GLUCOSE 100* 02/08/2015   CHOL 132 12/29/2014   TRIG 115 12/29/2014   HDL 36* 12/29/2014   LDLCALC 73 12/29/2014   ALT 10 12/29/2014   AST 16 12/29/2014   NA 139 02/08/2015   K 3.6 02/08/2015   CL 108 02/08/2015   CREATININE 0.92 02/08/2015   BUN 15 02/08/2015   CO2 25 02/08/2015   TSH 1.265 12/11/2013   INR 1.07 01/29/2015   HGBA1C 5.8* 12/11/2013    BNP (last 3 results)  Recent Labs  06/10/14 1349  PROBNP 474.9*   Lab Results  Component Value Date   TROPONINI 0.47* 02/04/2015       Assessment / Plan: 1. CAD s/p anterior STEMI with extensive LAD stenting. Chronic occlusion of RCA. Now s/p successful CTO PCI of the RCA with multiple DES and Graftmaster stent for perforation. Clinically angina and dyspnea improved. Will continue ASA and Plavix indefinitely. Recommend Cardiac Rehab- starting tomorrow.   2.  S/p coronary perforation with hemopericardium and cardiac tamponade. S/p successful pericardiocentesis and drain. Resolved. Pericarditis due to irritation of blood. Improved. Now off colchicine.  3. Paroxysmal Afib due to acute pericarditis. Resolved on amiodarone. Now on 200 mg daily. I would not recommend anticoagulation since I think his risk of Afib long term will decrease as it was related to acute insult. He is on DAPT chronically. We can use his ICD checks to monitor for Afib and if he continues to have more frequent episodes we will need to consider anticoagulation. Next ICD check in May.  4. Chronic Systolic heart failure -EF 30%.  Will continue current Coreg, lisinopril, and aldactone Rx. Recommend he take lasix 20 mg daily. His doses of coreg and lisinopril were reduced in the hospital due to hypotension and we will continue current doses for now. May consider titration as he improves but he did have some hypotension last week.  5. Underlying ICD - palpitations - continue beta blocker. No significant arrhythmia noted on ICD interrogation in Nov.  6. Hyperlipidemia. Well controlled on Pravachol.   7. Tobacco abuse- now quit.

## 2015-03-15 NOTE — Patient Instructions (Signed)
Continue your current therapy   I will see you in 3 months. 

## 2015-03-17 ENCOUNTER — Encounter (HOSPITAL_COMMUNITY)
Admission: RE | Admit: 2015-03-17 | Discharge: 2015-03-17 | Disposition: A | Discharge status: Home / Self Care | Payer: Medicaid Other | Source: Ambulatory Visit | Attending: Cardiology | Admitting: Cardiology

## 2015-03-17 ENCOUNTER — Telehealth: Payer: Self-pay | Admitting: Cardiology

## 2015-03-17 NOTE — Telephone Encounter (Signed)
Received a call from [facility] [place] requesting Kerry Kicks NP review EKG strips.Patient at rehab stated he feels tired today.Kerry Mason reviewed EKG strips she advised ok.

## 2015-03-17 NOTE — Progress Notes (Signed)
Kerry Mason is here for his first day of exercise at cardiac rehab. Sebastin says he feels fatigued today he worked from 0400 to 1400 today.  Telemetry rhythm Sinus, rate 71. Destiny reported that he thought he was in Atrial fibrillation last night for about 30 to 40 minutes.  Blood pressure 104/70. Zinedine denies having any chest pain today. Today's ECG tracing faxed over to Dr Doug Sou office and was reviewed by Cecilie Kicks FNP-C. No new orders received. Alem plans to return to exercise either on Friday or Monday.

## 2015-03-19 ENCOUNTER — Telehealth: Payer: Self-pay | Admitting: Nurse Practitioner

## 2015-03-19 ENCOUNTER — Encounter (HOSPITAL_COMMUNITY): Admission: RE | Admit: 2015-03-19 | Payer: Medicaid Other | Source: Ambulatory Visit

## 2015-03-19 NOTE — Telephone Encounter (Signed)
Pt called this evening stating that he has had flu-like symptoms with chest and nasal congestion, fatigue, and also pleuritic chest discomfort.  He wishes for advice on what to do.  I reviewed his history, which includes CAD and also procedure related tamponade and subsequent pericarditis.  I advised that it is difficult to know over the phone if his c/p is of cardiac origin or not and that if he isn't feeling well, it is advisable that he be evaluated in the ER tonight.  He thinks he will try an over the counter cold remedy to see if that helps first.  Murray Hodgkins, NP 03/19/2015, 6:37 PM

## 2015-03-22 ENCOUNTER — Encounter (HOSPITAL_COMMUNITY): Payer: Medicaid Other

## 2015-03-22 ENCOUNTER — Ambulatory Visit: Payer: Medicaid Other | Admitting: Cardiology

## 2015-03-24 ENCOUNTER — Encounter (HOSPITAL_COMMUNITY): Payer: Medicaid Other

## 2015-03-26 ENCOUNTER — Encounter (HOSPITAL_COMMUNITY): Payer: Medicaid Other

## 2015-03-29 ENCOUNTER — Encounter (HOSPITAL_COMMUNITY)
Admission: RE | Admit: 2015-03-29 | Discharge: 2015-03-29 | Disposition: A | Discharge status: Home / Self Care | Payer: Medicaid Other | Source: Ambulatory Visit | Attending: Cardiology | Admitting: Cardiology

## 2015-03-29 NOTE — Progress Notes (Signed)
Pt started cardiac rehab today.  Pt tolerated light exercise without difficulty. Telemetry rhythm Sinus with t wave inversion. Vital signs stable. No complaints with exercise. Psychosocial assessment: PHQ=0. No psychosocial needs identified at this time. Kerry Mason's short and long term goals are to get back on his bike. Kerry Mason hopes to exercise at cardiac rehab at least twice a week with his work schedule. Will continue to monitor the patient throughout  the program.

## 2015-03-31 ENCOUNTER — Telehealth: Payer: Self-pay | Admitting: Cardiology

## 2015-03-31 ENCOUNTER — Encounter (HOSPITAL_COMMUNITY): Payer: Medicaid Other

## 2015-03-31 NOTE — Telephone Encounter (Signed)
Pt called in stating that he has recently been drinking beet juice and as a result he "feels great" and he wanted to know this is something Dr. Martinique approves of him taking. Please call  Thanks

## 2015-04-01 NOTE — Telephone Encounter (Signed)
Returned call to patient spoke to Dr.Jordan he advised ok to drink beet juice if low sodium low sugar.

## 2015-04-02 ENCOUNTER — Encounter (HOSPITAL_COMMUNITY)
Admission: RE | Admit: 2015-04-02 | Discharge: 2015-04-02 | Disposition: A | Discharge status: Home / Self Care | Payer: Medicaid Other | Source: Ambulatory Visit | Attending: Cardiology | Admitting: Cardiology

## 2015-04-02 NOTE — Progress Notes (Addendum)
Academic intern briefly reviewed home exercise with pt today, as the pt has been in the program recently.  Pt plans to walk at home for exercise.  Reviewed THR, pulse, RPE, sign and symptoms, NTG use, and when to call 911 or MD.  Pt voiced understanding. Shirlyn Goltz, Academic Intern Alberteen Sam, MA, ACSM RCEP

## 2015-04-05 ENCOUNTER — Encounter (HOSPITAL_COMMUNITY): Payer: Medicaid Other

## 2015-04-07 ENCOUNTER — Encounter (HOSPITAL_COMMUNITY): Payer: Medicaid Other

## 2015-04-09 ENCOUNTER — Encounter (HOSPITAL_COMMUNITY): Payer: Medicaid Other

## 2015-04-12 ENCOUNTER — Encounter (HOSPITAL_COMMUNITY): Payer: Medicaid Other

## 2015-04-14 ENCOUNTER — Encounter (HOSPITAL_COMMUNITY): Payer: Medicaid Other

## 2015-04-16 ENCOUNTER — Encounter (HOSPITAL_COMMUNITY): Payer: Medicaid Other

## 2015-04-19 ENCOUNTER — Encounter (HOSPITAL_COMMUNITY): Payer: Medicaid Other

## 2015-04-21 ENCOUNTER — Ambulatory Visit (INDEPENDENT_AMBULATORY_CARE_PROVIDER_SITE_OTHER): Payer: Self-pay | Admitting: *Deleted

## 2015-04-21 ENCOUNTER — Encounter (HOSPITAL_COMMUNITY): Payer: Medicaid Other

## 2015-04-21 DIAGNOSIS — I255 Ischemic cardiomyopathy: Secondary | ICD-10-CM

## 2015-04-21 NOTE — Progress Notes (Signed)
Remote ICD transmission.   

## 2015-04-23 ENCOUNTER — Encounter (HOSPITAL_COMMUNITY): Payer: Medicaid Other

## 2015-04-26 ENCOUNTER — Encounter (HOSPITAL_COMMUNITY): Payer: Medicaid Other

## 2015-04-28 ENCOUNTER — Encounter (HOSPITAL_COMMUNITY): Payer: Medicaid Other

## 2015-04-28 LAB — CUP PACEART REMOTE DEVICE CHECK
Brady Statistic RV Percent Paced: 0 %
HIGH POWER IMPEDANCE MEASURED VALUE: 77 Ohm
Lead Channel Impedance Value: 478 Ohm
Lead Channel Sensing Intrinsic Amplitude: 14.3 mV
MDC IDC MSMT LEADCHNL RA SENSING INTR AMPL: 8 mV
MDC IDC SESS DTM: 20160525175936
Pulse Gen Model: 383594
Pulse Gen Serial Number: 60800912

## 2015-04-30 ENCOUNTER — Encounter (HOSPITAL_COMMUNITY): Payer: Medicaid Other

## 2015-05-04 ENCOUNTER — Telehealth (HOSPITAL_COMMUNITY): Payer: Self-pay | Admitting: *Deleted

## 2015-05-05 ENCOUNTER — Encounter (HOSPITAL_COMMUNITY): Payer: Medicaid Other

## 2015-05-05 ENCOUNTER — Encounter: Payer: Self-pay | Admitting: Cardiology

## 2015-05-07 ENCOUNTER — Encounter (HOSPITAL_COMMUNITY): Payer: Medicaid Other

## 2015-05-10 ENCOUNTER — Encounter (HOSPITAL_COMMUNITY): Payer: Medicaid Other

## 2015-05-10 ENCOUNTER — Encounter: Payer: Self-pay | Admitting: Internal Medicine

## 2015-05-12 ENCOUNTER — Encounter (HOSPITAL_COMMUNITY): Payer: Medicaid Other

## 2015-05-13 ENCOUNTER — Telehealth (HOSPITAL_COMMUNITY): Payer: Self-pay | Admitting: *Deleted

## 2015-05-14 ENCOUNTER — Encounter (HOSPITAL_COMMUNITY): Admission: RE | Admit: 2015-05-14 | Payer: Medicaid Other | Source: Ambulatory Visit

## 2015-05-17 ENCOUNTER — Encounter (HOSPITAL_COMMUNITY): Payer: Medicaid Other

## 2015-05-19 ENCOUNTER — Encounter (HOSPITAL_COMMUNITY): Payer: Medicaid Other

## 2015-05-19 ENCOUNTER — Encounter: Payer: Self-pay | Admitting: Cardiology

## 2015-05-21 ENCOUNTER — Encounter (HOSPITAL_COMMUNITY): Payer: Medicaid Other

## 2015-05-24 ENCOUNTER — Encounter (HOSPITAL_COMMUNITY): Payer: Medicaid Other

## 2015-05-26 ENCOUNTER — Encounter (HOSPITAL_COMMUNITY): Payer: Medicaid Other

## 2015-05-28 ENCOUNTER — Encounter (HOSPITAL_COMMUNITY): Payer: Medicaid Other

## 2015-05-31 ENCOUNTER — Encounter (HOSPITAL_COMMUNITY): Payer: Medicaid Other

## 2015-06-02 ENCOUNTER — Encounter (HOSPITAL_COMMUNITY): Payer: Medicaid Other

## 2015-06-04 ENCOUNTER — Encounter (HOSPITAL_COMMUNITY): Payer: Medicaid Other

## 2015-06-07 ENCOUNTER — Encounter (HOSPITAL_COMMUNITY): Payer: Medicaid Other

## 2015-06-09 ENCOUNTER — Encounter (HOSPITAL_COMMUNITY): Payer: Medicaid Other

## 2015-06-11 ENCOUNTER — Encounter (HOSPITAL_COMMUNITY): Payer: Medicaid Other

## 2015-06-13 ENCOUNTER — Inpatient Hospital Stay (HOSPITAL_COMMUNITY)
Admission: EM | Admit: 2015-06-13 | Discharge: 2015-06-16 | DRG: 281 | Disposition: A | Discharge status: Home / Self Care | Payer: Medicaid Other | Attending: Internal Medicine | Admitting: Internal Medicine

## 2015-06-13 ENCOUNTER — Encounter (HOSPITAL_COMMUNITY): Payer: Self-pay | Admitting: Emergency Medicine

## 2015-06-13 ENCOUNTER — Emergency Department (HOSPITAL_COMMUNITY): Payer: Medicaid Other

## 2015-06-13 DIAGNOSIS — Z9861 Coronary angioplasty status: Secondary | ICD-10-CM

## 2015-06-13 DIAGNOSIS — Z7902 Long term (current) use of antithrombotics/antiplatelets: Secondary | ICD-10-CM | POA: Diagnosis not present

## 2015-06-13 DIAGNOSIS — I252 Old myocardial infarction: Secondary | ICD-10-CM | POA: Diagnosis not present

## 2015-06-13 DIAGNOSIS — E785 Hyperlipidemia, unspecified: Secondary | ICD-10-CM | POA: Diagnosis present

## 2015-06-13 DIAGNOSIS — Z006 Encounter for examination for normal comparison and control in clinical research program: Secondary | ICD-10-CM | POA: Diagnosis not present

## 2015-06-13 DIAGNOSIS — I200 Unstable angina: Secondary | ICD-10-CM | POA: Diagnosis present

## 2015-06-13 DIAGNOSIS — Z87891 Personal history of nicotine dependence: Secondary | ICD-10-CM

## 2015-06-13 DIAGNOSIS — Z7982 Long term (current) use of aspirin: Secondary | ICD-10-CM | POA: Diagnosis not present

## 2015-06-13 DIAGNOSIS — I25110 Atherosclerotic heart disease of native coronary artery with unstable angina pectoris: Secondary | ICD-10-CM | POA: Diagnosis present

## 2015-06-13 DIAGNOSIS — I255 Ischemic cardiomyopathy: Secondary | ICD-10-CM | POA: Diagnosis present

## 2015-06-13 DIAGNOSIS — Z8249 Family history of ischemic heart disease and other diseases of the circulatory system: Secondary | ICD-10-CM | POA: Diagnosis not present

## 2015-06-13 DIAGNOSIS — I2583 Coronary atherosclerosis due to lipid rich plaque: Secondary | ICD-10-CM | POA: Diagnosis present

## 2015-06-13 DIAGNOSIS — T82857A Stenosis of cardiac prosthetic devices, implants and grafts, initial encounter: Secondary | ICD-10-CM | POA: Diagnosis present

## 2015-06-13 DIAGNOSIS — Z95810 Presence of automatic (implantable) cardiac defibrillator: Secondary | ICD-10-CM | POA: Diagnosis not present

## 2015-06-13 DIAGNOSIS — E663 Overweight: Secondary | ICD-10-CM | POA: Diagnosis present

## 2015-06-13 DIAGNOSIS — I480 Paroxysmal atrial fibrillation: Secondary | ICD-10-CM | POA: Diagnosis present

## 2015-06-13 DIAGNOSIS — I10 Essential (primary) hypertension: Secondary | ICD-10-CM | POA: Diagnosis present

## 2015-06-13 DIAGNOSIS — I2510 Atherosclerotic heart disease of native coronary artery without angina pectoris: Secondary | ICD-10-CM

## 2015-06-13 DIAGNOSIS — R079 Chest pain, unspecified: Secondary | ICD-10-CM

## 2015-06-13 DIAGNOSIS — I214 Non-ST elevation (NSTEMI) myocardial infarction: Secondary | ICD-10-CM | POA: Diagnosis present

## 2015-06-13 DIAGNOSIS — I4891 Unspecified atrial fibrillation: Secondary | ICD-10-CM | POA: Diagnosis not present

## 2015-06-13 DIAGNOSIS — I314 Cardiac tamponade: Secondary | ICD-10-CM | POA: Diagnosis present

## 2015-06-13 DIAGNOSIS — I209 Angina pectoris, unspecified: Secondary | ICD-10-CM | POA: Diagnosis not present

## 2015-06-13 DIAGNOSIS — I5022 Chronic systolic (congestive) heart failure: Secondary | ICD-10-CM | POA: Diagnosis present

## 2015-06-13 DIAGNOSIS — I2582 Chronic total occlusion of coronary artery: Secondary | ICD-10-CM | POA: Diagnosis present

## 2015-06-13 LAB — TROPONIN I: Troponin I: <0.03 ng/mL (ref <0.031)

## 2015-06-13 LAB — I-STAT TROPONIN, ED: Troponin i, poc: 0 ng/mL (ref 0.00–0.08)

## 2015-06-13 LAB — BASIC METABOLIC PANEL
Anion gap: 9 (ref 5–15)
BUN: 18 mg/dL (ref 6–20)
CHLORIDE: 106 mmol/L (ref 101–111)
CO2: 22 mmol/L (ref 22–32)
CREATININE: 1.28 mg/dL — AB (ref 0.61–1.24)
Calcium: 9.2 mg/dL (ref 8.9–10.3)
GFR calc Af Amer: >60 mL/min (ref >60)
GFR calc non Af Amer: 60 mL/min — ABNORMAL LOW (ref >60)
Glucose, Bld: 123 mg/dL — ABNORMAL HIGH (ref 65–99)
POTASSIUM: 4.5 mmol/L (ref 3.5–5.1)
Sodium: 137 mmol/L (ref 135–145)

## 2015-06-13 LAB — CBC
HCT: 44.9 % (ref 39.0–52.0)
Hemoglobin: 15.3 g/dL (ref 13.0–17.0)
MCH: 29.3 pg (ref 26.0–34.0)
MCHC: 34.1 g/dL (ref 30.0–36.0)
MCV: 86 fL (ref 78.0–100.0)
Platelets: 290 10*3/uL (ref 150–400)
RBC: 5.22 MIL/uL (ref 4.22–5.81)
RDW: 15.2 % (ref 11.5–15.5)
WBC: 10.3 10*3/uL (ref 4.0–10.5)

## 2015-06-13 LAB — PROTIME-INR
INR: 1.13 (ref 0.00–1.49)
PROTHROMBIN TIME: 14.6 s (ref 11.6–15.2)

## 2015-06-13 LAB — APTT: APTT: 32 s (ref 24–37)

## 2015-06-13 MED ORDER — ACETAMINOPHEN 325 MG PO TABS
650.0000 mg | ORAL_TABLET | ORAL | Status: DC | PRN
  Administered 2015-06-14: 650 mg via ORAL
  Filled 2015-06-13: qty 2

## 2015-06-13 MED ORDER — LISINOPRIL 5 MG PO TABS
5.0000 mg | ORAL_TABLET | Freq: Every day | ORAL | Status: DC
  Administered 2015-06-15 – 2015-06-16 (×2): 5 mg via ORAL
  Filled 2015-06-13 (×3): qty 1

## 2015-06-13 MED ORDER — ONDANSETRON HCL 4 MG/2ML IJ SOLN: 4.0000 mg | Freq: Four times a day (QID) | INTRAMUSCULAR | Status: DC | PRN

## 2015-06-13 MED ORDER — OMEGA-3-ACID ETHYL ESTERS 1 G PO CAPS
1.0000 g | ORAL_CAPSULE | Freq: Every day | ORAL | Status: DC
  Administered 2015-06-14 – 2015-06-16 (×3): 1 g via ORAL
  Filled 2015-06-13 (×3): qty 1

## 2015-06-13 MED ORDER — NITROGLYCERIN 0.4 MG SL SUBL
0.4000 mg | SUBLINGUAL_TABLET | SUBLINGUAL | Status: DC | PRN
Start: 2015-06-13 — End: 2015-06-16

## 2015-06-13 MED ORDER — SPIRONOLACTONE 25 MG PO TABS
25.0000 mg | ORAL_TABLET | Freq: Every day | ORAL | Status: DC
  Administered 2015-06-14 – 2015-06-15 (×3): 25 mg via ORAL
  Filled 2015-06-13 (×5): qty 1

## 2015-06-13 MED ORDER — CLOPIDOGREL BISULFATE 75 MG PO TABS
75.0000 mg | ORAL_TABLET | Freq: Every day | ORAL | Status: DC
  Administered 2015-06-14: 75 mg via ORAL
  Filled 2015-06-13 (×2): qty 1

## 2015-06-13 MED ORDER — CARVEDILOL 6.25 MG PO TABS
6.2500 mg | ORAL_TABLET | Freq: Two times a day (BID) | ORAL | Status: DC
  Administered 2015-06-14 – 2015-06-16 (×4): 6.25 mg via ORAL
  Filled 2015-06-13 (×10): qty 1

## 2015-06-13 MED ORDER — HEPARIN (PORCINE) IN NACL 100-0.45 UNIT/ML-% IJ SOLN
1300.0000 [IU]/h | INTRAMUSCULAR | Status: DC
  Administered 2015-06-13 – 2015-06-14 (×2): 1300 [IU]/h via INTRAVENOUS
  Filled 2015-06-13 (×4): qty 250

## 2015-06-13 MED ORDER — ALLOPURINOL 300 MG PO TABS
300.0000 mg | ORAL_TABLET | Freq: Every day | ORAL | Status: DC
  Administered 2015-06-15 – 2015-06-16 (×2): 300 mg via ORAL
  Filled 2015-06-13 (×3): qty 1

## 2015-06-13 MED ORDER — ATORVASTATIN CALCIUM 40 MG PO TABS
40.0000 mg | ORAL_TABLET | Freq: Every day | ORAL | Status: DC
  Administered 2015-06-14 – 2015-06-15 (×2): 40 mg via ORAL
  Filled 2015-06-13 (×3): qty 1

## 2015-06-13 MED ORDER — FUROSEMIDE 20 MG PO TABS
20.0000 mg | ORAL_TABLET | Freq: Every day | ORAL | Status: DC
  Administered 2015-06-15 – 2015-06-16 (×2): 20 mg via ORAL
  Filled 2015-06-13 (×3): qty 1

## 2015-06-13 MED ORDER — MAGNESIUM OXIDE 400 (241.3 MG) MG PO TABS
400.0000 mg | ORAL_TABLET | Freq: Every day | ORAL | Status: DC
  Administered 2015-06-14 – 2015-06-16 (×3): 400 mg via ORAL
  Filled 2015-06-13 (×3): qty 1

## 2015-06-13 MED ORDER — HEPARIN BOLUS VIA INFUSION
4000.0000 [IU] | Freq: Once | INTRAVENOUS | Status: AC
  Administered 2015-06-13: 4000 [IU] via INTRAVENOUS
  Filled 2015-06-13: qty 4000

## 2015-06-13 MED ORDER — NITROGLYCERIN IN D5W 200-5 MCG/ML-% IV SOLN
0.0000 ug/min | Freq: Once | INTRAVENOUS | Status: AC
Start: 2015-06-13 — End: 2015-06-13
  Administered 2015-06-13: 5 ug/min via INTRAVENOUS
  Filled 2015-06-13: qty 250

## 2015-06-13 MED ORDER — ASPIRIN EC 81 MG PO TBEC
81.0000 mg | DELAYED_RELEASE_TABLET | Freq: Every day | ORAL | Status: DC
  Filled 2015-06-13: qty 1

## 2015-06-13 MED ORDER — ASPIRIN 81 MG PO CHEW
324.0000 mg | CHEWABLE_TABLET | Freq: Once | ORAL | Status: AC
  Administered 2015-06-13: 324 mg via ORAL
  Filled 2015-06-13: qty 4

## 2015-06-13 NOTE — ED Notes (Signed)
Attempted report 

## 2015-06-13 NOTE — ED Provider Notes (Signed)
CSN: [id number]     Arrival date & time 06/13/15  1808 History   First MD Initiated Contact with Patient 06/13/15 1819     Chief Complaint  Patient presents with  . Chest Pain     (Consider location/radiation/quality/duration/timing/severity/associated sxs/prior Treatment) HPI Comments: Patient presents to the ER for evaluation of chest pain. Patient reports that he has an extensive cardiac history. He had a heart attack one year ago. He has stents. He is followed by Dr. Martinique.  Patient reports that he had pain yesterday. Pain is in the center of his chest and radiates into his back and down his arms with a heaviness of the arms. He took nitroglycerin yesterday with resolution. Today, however, around 3 PM the pain returns. He has taken 3 nitroglycerin. He reports that each time he takes it the pain let up, but then comes back. He has not had relief. Patient reports that his symptoms today are identical to his MI. He is not expressing shortness of breath. He has had nausea, vomiting and diaphoresis.  Patient is a 59 y.o. male presenting with chest pain.  Chest Pain Associated symptoms: nausea and vomiting     Past Medical History  Diagnosis Date  . CAD (coronary artery disease)     a. 2000: s/p stent of RCA 2000 with BMS  b. 2015: STEMI s/p LHC with old occlusion of RCA and DESx2 to LAD  c. 4/0/98: complicated PCI on 3/2 for CTO of mid RCA with coronary perforation and cardiac tamponade requiring emergent pericardiocentesis     . Tobacco abuse   . Obesity   . HTN (hypertension)   . Hyperlipidemia   . Acute systolic CHF (congestive heart failure)   . Ischemic cardiomyopathy     a. 2D ECHO: EF 25-30%. Akinesis of the anteroseptal and  . AICD (automatic cardioverter/defibrillator) present   . Anxiety   . Cardiac tamponade     a. 02/03/15 2/2 coronary perforation during CTO procedure. Sealed with graftmaster coated stent.   . Gout    Past Surgical History  Procedure Laterality Date  .  Cardiac catheterization    . Coronary angioplasty  2000  . Coronary angioplasty with stent placement  2015  . Left heart catheterization with coronary angiogram N/A 12/11/2013    Procedure: LEFT HEART CATHETERIZATION WITH CORONARY ANGIOGRAM;  Surgeon: Peter M Martinique, MD;  Location: [facility] CATH LAB;  Service: Cardiovascular;  Laterality: N/A;  . Percutaneous coronary stent intervention (pci-s)  12/11/2013    Procedure: PERCUTANEOUS CORONARY STENT INTERVENTION (PCI-S);  Surgeon: Peter M Martinique, MD;  Location: [facility] CATH LAB;  Service: Cardiovascular;;  prov LAD and mid LAD  . Implantable cardioverter defibrillator implant N/A 04/17/2014    Procedure: IMPLANTABLE CARDIOVERTER DEFIBRILLATOR IMPLANT;  Surgeon: Evans Lance, MD;  Location: [facility] CATH LAB;  Service: Cardiovascular;  Laterality: N/A;  . Left heart catheterization with coronary angiogram N/A 01/05/2015    Procedure: LEFT HEART CATHETERIZATION WITH CORONARY ANGIOGRAM;  Surgeon: Peter M Martinique, MD;  Location: [facility] CATH LAB;  Service: Cardiovascular;  Laterality: N/A;  . Percutaneous coronary stent intervention (pci-s) N/A 02/03/2015    Procedure: PERCUTANEOUS CORONARY STENT INTERVENTION (PCI-S);  Surgeon: Jettie Booze, MD;  Location: [facility] CATH LAB;  Service: Cardiovascular;  Laterality: N/A;   Family History  Problem Relation Age of Onset  . CAD Father     PTCA   History  Substance Use Topics  . Smoking status: Former Smoker -- 1.00 packs/day for 35 years  Types: Cigarettes    Quit date: 12/11/2013  . Smokeless tobacco: Not on file  . Alcohol Use: No    Review of Systems  Cardiovascular: Positive for chest pain.  Gastrointestinal: Positive for nausea and vomiting.  All other systems reviewed and are negative.     Allergies  Review of patient's allergies indicates no known allergies.  Home Medications   Prior to Admission medications   Medication Sig Start Date End Date Taking? Authorizing Provider  allopurinol (ZYLOPRIM) 300 MG  tablet Take 1 tablet (300 mg total) by mouth daily. 06/12/14   Burtis Junes, NP  amiodarone (PACERONE) 200 MG tablet Take 1 tablet (200 mg total) by mouth daily. 02/17/15   Peter M Martinique, MD  aspirin EC 81 MG tablet Take 81 mg by mouth daily.    Historical Provider, MD  atorvastatin (LIPITOR) 40 MG tablet Take 1 tablet (40 mg total) by mouth daily at 6 PM. 02/08/15   Eileen Stanford, PA-C  carvedilol (COREG) 6.25 MG tablet Take 1 tablet (6.25 mg total) by mouth 2 (two) times daily. 02/08/15   Eileen Stanford, PA-C  clopidogrel (PLAVIX) 75 MG tablet Take 1 tablet (75 mg total) by mouth daily. 12/18/14   Peter M Martinique, MD  Fluocinonide 0.1 % CREA Apply 1 application topically 3 (three) times daily as needed (rash - on legs, hands and bottom).    Historical Provider, MD  furosemide (LASIX) 20 MG tablet Take 20 mg by mouth daily.    Historical Provider, MD  lisinopril (PRINIVIL,ZESTRIL) 5 MG tablet Take 1 tablet (5 mg total) by mouth daily. 02/08/15   Eileen Stanford, PA-C  Multiple Vitamin (MULTIVITAMIN WITH MINERALS) TABS tablet Take 1 tablet by mouth daily.    Historical Provider, MD  nitroGLYCERIN (NITROSTAT) 0.4 MG SL tablet Place 1 tablet (0.4 mg total) under the tongue every 5 (five) minutes as needed for chest pain. 06/12/14   Burtis Junes, NP  spironolactone (ALDACTONE) 25 MG tablet Take 1 tablet (25 mg total) by mouth at bedtime. 06/12/14   Burtis Junes, NP  triamcinolone cream (KENALOG) 0.1 % Apply 1 application topically 5 (five) times daily as needed (rash on legs).    Historical Provider, MD   BP 157/122 mmHg  Pulse 79  Temp(Src) 98.5 F (36.9 C) (Oral)  Resp 16  Ht 6\' 1"  (1.854 m)  Wt 204 lb (92.534 kg)  BMI 26.92 kg/m2  SpO2 96% Physical Exam  Constitutional: He is oriented to person, place, and time. He appears well-developed and well-nourished. No distress.  HENT:  Head: Normocephalic and atraumatic.  Right Ear: Hearing normal.  Left Ear: Hearing normal.  Nose:  Nose normal.  Mouth/Throat: Oropharynx is clear and moist and mucous membranes are normal.  Eyes: Conjunctivae and EOM are normal. Pupils are equal, round, and reactive to light.  Neck: Normal range of motion. Neck supple.  Cardiovascular: Regular rhythm, S1 normal and S2 normal.  Exam reveals no gallop and no friction rub.   No murmur heard. Pulmonary/Chest: Effort normal and breath sounds normal. No respiratory distress. He exhibits no tenderness.  Abdominal: Soft. Normal appearance and bowel sounds are normal. There is no hepatosplenomegaly. There is no tenderness. There is no rebound, no guarding, no tenderness at McBurney's point and negative Murphy's sign. No hernia.  Musculoskeletal: Normal range of motion.  Neurological: He is alert and oriented to person, place, and time. He has normal strength. No cranial nerve deficit or sensory deficit. Coordination normal.  GCS eye subscore is 4. GCS verbal subscore is 5. GCS motor subscore is 6.  Skin: Skin is warm, dry and intact. No rash noted. No cyanosis.  Psychiatric: He has a normal mood and affect. His speech is normal and behavior is normal. Thought content normal.  Nursing note and vitals reviewed.   ED Course  Procedures (including critical care time) Labs Review Labs Reviewed  CBC  BASIC METABOLIC PANEL  TROPONIN I  PROTIME-INR  APTT  HEPARIN LEVEL (UNFRACTIONATED)  CBC  I-STAT TROPOININ, ED    Imaging Review No results found.   EKG Interpretation   Date/Time:  Sunday June 13 2015 18:14:26 EDT Ventricular Rate:  68 PR Interval:  136 QRS Duration: 102 QT Interval:  378 QTC Calculation: 401 R Axis:   95 Text Interpretation:  Normal sinus rhythm Possible Left atrial enlargement  Rightward axis Anteroseptal infarct , age undetermined Marked ST  abnormality, possible inferior subendocardial injury Abnormal ECG inferior  lateral ischemia new when compared to prior Confirmed by ALLEN  MD,  ANTHONY ([id number]) on 06/13/2015  6:16:27 PM      MDM   Final diagnoses:  Unstable angina    Patient presents to the ER for evaluation of chest pain. He does have an extensive cardiac history. He had pain yesterday that resolved with nitroglycerin, pain has returned today and has been only partially relieved with multiple doses of nitroglycerin. EKG on arrival reveals T-wave inversions and depressions laterally which are new. He has very slight elevations in V1 and V2, but these are not diagnostic for an ST elevation MI at this time. This was discussed with Dr. Oval Linsey, cardiology. She did review the images and agreed that this was not an STEMI. Patient initiated on nitroglycerin, heparin and will be admitted to the hospital for further management.  CRITICAL CARE Performed by: Orpah Greek   Total critical care time: 76min  Critical care time was exclusive of separately billable procedures and treating other patients.  Critical care was necessary to treat or prevent imminent or life-threatening deterioration.  Critical care was time spent personally by me on the following activities: development of treatment plan with patient and/or surrogate as well as nursing, discussions with consultants, evaluation of patient's response to treatment, examination of patient, obtaining history from patient or surrogate, ordering and performing treatments and interventions, ordering and review of laboratory studies, ordering and review of radiographic studies, pulse oximetry and re-evaluation of patient's condition.     Orpah Greek, MD 06/13/15 [phone]

## 2015-06-13 NOTE — Progress Notes (Signed)
ANTICOAGULATION CONSULT NOTE - Initial Consult  Pharmacy Consult for heparin Indication: chest pain/ACS  No Known Allergies  Patient Measurements: Height: 6\' 1"  (185.4 cm) Weight:  (Pt states weight is 11 lbs more than usual) IBW/kg (Calculated) : 79.9 Heparin Dosing Weight: 92.5 kg  Vital Signs: Temp: 98.5 F (36.9 C) (07/10 1819) Temp Source: Oral (07/10 1819) BP: 157/122 mmHg (07/10 1819) Pulse Rate: 79 (07/10 1819)  Labs: No results for input(s): HGB, HCT, PLT, APTT, LABPROT, INR, HEPARINUNFRC, CREATININE, CKTOTAL, CKMB, TROPONINI in the last 72 hours.  CrCl cannot be calculated (Patient has no serum creatinine result on file.).   Medical History: Past Medical History  Diagnosis Date  . CAD (coronary artery disease)     a. 2000: s/p stent of RCA 2000 with BMS  b. 2015: STEMI s/p LHC with old occlusion of RCA and DESx2 to LAD  c. 2/0/10: complicated PCI on 3/2 for CTO of mid RCA with coronary perforation and cardiac tamponade requiring emergent pericardiocentesis     . Tobacco abuse   . Obesity   . HTN (hypertension)   . Hyperlipidemia   . Acute systolic CHF (congestive heart failure)   . Ischemic cardiomyopathy     a. 2D ECHO: EF 25-30%. Akinesis of the anteroseptal and  . AICD (automatic cardioverter/defibrillator) present   . Anxiety   . Cardiac tamponade     a. 02/03/15 2/2 coronary perforation during CTO procedure. Sealed with graftmaster coated stent.   . Gout     Medications:  Scheduled:  . aspirin  324 mg Oral Once  . nitroGLYCERIN  0-200 mcg/min Intravenous Once   Infusions:    Assessment: 59 yo male with ACS will be started on heparin.  Per patient, not on any anticoagulation prior to admission.  Goal of Therapy:  Heparin level 0.3-0.7 units/ml Monitor platelets by anticoagulation protocol: Yes   Plan:  - heparin 4000 units iv bolus x1, then start heparin drip at 1300 units/hr - 6 hr heparin level - daily heparin level and CBC  Kandace Elrod,  Tsz-Yin 06/13/2015,6:32 PM

## 2015-06-13 NOTE — ED Notes (Addendum)
C/o sharp pain across chest that radiates to bilateral arms and shoulder blades.  Also reports diaphoresis and nausea.  Denies sob.  Pt has had 3 home NTG- states "it lets up a little and then comes back."

## 2015-06-13 NOTE — H&P (Signed)
HPI: Kerry Mason is a 24M with CAD s/p STEMI (DES-->LAD x2 2015) and PCI of CTO RCA c/b perforation and tamponade 02/2015), HTN, HL, chronic systolic HF EF 40-98%, s/p ICD, and atrial fibrillation here with CP.  Symptoms began at 3pm with numbness between the scapula, emesis and cold sweats.  Symptoms were similar to his prior MI.  He also endorsed diaphoresis and SOB.  Sublingual NTG helped for a few minutes and the discomfort returned, so he decided to be evaluated in the ED.  He notes that his weight has been stable, but by the hospital scale he is 11lb over his baseline.  In the ED, Kerry Mason was hemodynamically stable and ECG showed a prior anteroseptal infarct and new lateral ST depressions.  He was started on nitroglycerin and heparin infusions and is currently CP free.    Review of Systems:     Cardiac Review of Systems: {Y] = yes [ ]  = no  Chest Pain [  y  ]  Resting SOB [   ] Exertional SOB  Blue.Reese  ]  Orthopnea [  ]   Pedal Edema [   ]    Palpitations [  ] Syncope  [  ]   Presyncope [   ]  General Review of Systems: [Y] = yes [  ]=no Constitional: recent weight change [  ]; anorexia [  ]; fatigue [  ]; nausea [  ]; night sweats [  ]; fever [  ]; or chills [  ];                                                                                                                                          Dental: poor dentition[  ];  Eye : blurred vision [  ]; diplopia [   ]; vision changes [  ];  Amaurosis fugax[  ]; Resp: cough [  ];  wheezing[  ];  hemoptysis[  ]; shortness of breath[  ]; paroxysmal nocturnal dyspnea[  ]; dyspnea on exertion[  ]; or orthopnea[  ];  GI:  gallstones[  ], vomiting[  ];  dysphagia[  ]; melena[  ];  hematochezia [  ]; heartburn[  ];   Hx of  Colonoscopy[  ]; GU: kidney stones [  ]; hematuria[  ];   dysuria [  ];  nocturia[  ];  history of     obstruction [  ];                 Skin: rash, swelling[  ];, hair loss[  ];  peripheral edema[  ];  or itching[   ]; Musculosketetal: myalgias[  ];  joint swelling[  ];  joint erythema[  ];  joint pain[  ];  back pain[  ];  Heme/Lymph: bruising[  ];  bleeding[  ];  anemia[  ];  Neuro: TIA[  ];  headaches[  ];  stroke[  ];  vertigo[  ];  seizures[  ];   paresthesias[  ];  difficulty walking[  ];  Psych:depression[  ]; anxiety[  ];  Endocrine: diabetes[  ];  thyroid dysfunction[  ];  Immunizations: Flu [  ]; Pneumococcal[  ];  Other:  Past Medical History  Diagnosis Date  . CAD (coronary artery disease)     a. 2000: s/p stent of RCA 2000 with BMS  b. 2015: STEMI s/p LHC with old occlusion of RCA and DESx2 to LAD  c. 01/12/51: complicated PCI on 3/2 for CTO of mid RCA with coronary perforation and cardiac tamponade requiring emergent pericardiocentesis     . Tobacco abuse   . Obesity   . HTN (hypertension)   . Hyperlipidemia   . Acute systolic CHF (congestive heart failure)   . Ischemic cardiomyopathy     a. 2D ECHO: EF 25-30%. Akinesis of the anteroseptal and  . AICD (automatic cardioverter/defibrillator) present   . Anxiety   . Cardiac tamponade     a. 02/03/15 2/2 coronary perforation during CTO procedure. Sealed with graftmaster coated stent.   . Gout      (Not in a hospital admission)   No Known Allergies  History   Social History  . Marital Status: Married    Spouse Name: N/A  . Number of Children: 2  . 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 Smoking status: Former Smoker -- 1.00 packs/day for 35 years    Types: Cigarettes    Quit date: 12/11/2013  . Smokeless tobacco: Not on file  . Alcohol Use: No  . Drug Use: No  . Sexual Activity: Not Currently   Other Topics Concern  . Not on file   Social History Narrative    Family History  Problem Relation Age of Onset  . CAD Father     PTCA    PHYSICAL EXAM: Filed Vitals:   06/13/15 2130  BP: 120/77  Pulse: 63  Temp:   Resp: 26   General:  Well  appearing. No respiratory difficulty HEENT: normal Neck: supple. no JVD. Carotids 2+ bilat; no bruits. No lymphadenopathy or thryomegaly appreciated. Cor: PMI nondisplaced. Regular rate & rhythm. No rubs, gallops or murmurs. Lungs: clear Abdomen: soft, nontender, nondistended. No hepatosplenomegaly. No bruits or masses. Good bowel sounds. Extremities: no cyanosis, clubbing, rash, edema Neuro: alert & oriented x 3, cranial nerves grossly intact. moves all 4 extremities w/o difficulty. Affect pleasant.  ECG: Sinus at 68bpm.  R axis deviation.  Prior anteroseptal infarct.  Lateral ST depression and TWI concerning for ischemia.  Results for orders placed or performed during the hospital encounter of 06/13/15 (from the past 24 hour(s))  CBC     Status: None   Collection Time: 06/13/15  6:30 PM  Result Value Ref Range   WBC 10.3 4.0 - 10.5 K/uL   RBC 5.22 4.22 - 5.81 MIL/uL   Hemoglobin 15.3 13.0 - 17.0 g/dL   HCT 44.9 39.0 - 52.0 %   MCV 86.0 78.0 - 100.0 fL   MCH 29.3 26.0 - 34.0 pg   MCHC 34.1 30.0 - 36.0 g/dL   RDW 15.2 11.5 - 15.5 %   Platelets 290 150 - 400 K/uL  Basic metabolic panel     Status: Abnormal   Collection Time: 06/13/15  6:30 PM  Result Value Ref Range   Sodium 137  135 - 145 mmol/L   Potassium 4.5 3.5 - 5.1 mmol/L   Chloride 106 101 - 111 mmol/L   CO2 22 22 - 32 mmol/L   Glucose, Bld 123 (H) 65 - 99 mg/dL   BUN 18 6 - 20 mg/dL   Creatinine, Ser 1.28 (H) 0.61 - 1.24 mg/dL   Calcium 9.2 8.9 - 10.3 mg/dL   GFR calc non Af Amer 60 (L) >60 mL/min   GFR calc Af Amer >60 >60 mL/min   Anion gap 9 5 - 15  Troponin I     Status: None   Collection Time: 06/13/15  6:30 PM  Result Value Ref Range   Troponin I <0.03 <0.031 ng/mL  Protime-INR     Status: None   Collection Time: 06/13/15  6:30 PM  Result Value Ref Range   Prothrombin Time 14.6 11.6 - 15.2 seconds   INR 1.13 0.00 - 1.49  APTT     Status: None   Collection Time: 06/13/15  6:30 PM  Result Value Ref Range    aPTT 32 24 - 37 seconds  I-stat troponin, ED  (not at [facility], [facility])     Status: None   Collection Time: 06/13/15  6:56 PM  Result Value Ref Range   Troponin i, poc 0.00 0.00 - 0.08 ng/mL   Comment 3           Dg Chest Port 1 View  06/13/2015   CLINICAL DATA:  Chest pain  EXAM: PORTABLE CHEST - 1 VIEW  COMPARISON:  02/05/2015  FINDINGS: Lungs are clear.  No pleural effusion or pneumothorax.  The heart is normal in size.  Right chest ICD.  IMPRESSION: No evidence of acute cardiopulmonary disease.   Electronically Signed   By: Julian Hy M.D.   On: 06/13/2015 19:01     ASSESSMENT: 58M with CAD s/p STEMI (DES-->LAD x2 2015) and PCI of CTO RCA c/b perforation and tamponade 02/2015), HTN, HL, chronic systolic HF, and atrial fibrillation here with CP.      PLAN/DISCUSSION:  # CAD/CP:  Patient has concerning symptoms and ECG changes.  First set of cardiac enzymes is wnl.  Pain is alleviated with NTG.  He is on a good cardiac regimen.  If he rules out for ACS, could consider the addition of long-acting nitrates or Ranexa.  - Admission for cardiac rule out - Heparin started in ED.  Reasonable to continue as we cycle cardiac enzymes - Continue home ASA, statin, beta blocker, plavix - Cath vs stress based on lab results.  Likely cath given his high pretest probability of disease.  # Atrial fibrillation: CHADS2VASc is 2.  Not currently on amiodarone or  - Patient not taking amiodarone as he states Dr. Martinique may d/c.  This was not ordered  # Chronic systolic HF: Weight has been stable and no report of acute HF symptoms, though his weight on the hospital scale is elevated from his home readings by 11 lbs.  Patient does not appear volume overloaded on exam.  EF 25-30. - Continue lasix 20mg  po daily for chronic HF - ICD in place - Continue beta blocerk and ACE-I  # Code: Full

## 2015-06-14 ENCOUNTER — Encounter (HOSPITAL_COMMUNITY)
Admission: EM | Disposition: A | Discharge status: Home / Self Care | Payer: Self-pay | Source: Home / Self Care | Attending: Internal Medicine

## 2015-06-14 ENCOUNTER — Encounter (HOSPITAL_COMMUNITY): Payer: Self-pay | Admitting: Cardiovascular Disease

## 2015-06-14 ENCOUNTER — Encounter (HOSPITAL_COMMUNITY): Payer: Medicaid Other

## 2015-06-14 DIAGNOSIS — I25110 Atherosclerotic heart disease of native coronary artery with unstable angina pectoris: Secondary | ICD-10-CM

## 2015-06-14 DIAGNOSIS — I10 Essential (primary) hypertension: Secondary | ICD-10-CM

## 2015-06-14 DIAGNOSIS — E785 Hyperlipidemia, unspecified: Secondary | ICD-10-CM

## 2015-06-14 DIAGNOSIS — I200 Unstable angina: Secondary | ICD-10-CM | POA: Diagnosis present

## 2015-06-14 DIAGNOSIS — I214 Non-ST elevation (NSTEMI) myocardial infarction: Secondary | ICD-10-CM | POA: Diagnosis present

## 2015-06-14 HISTORY — PX: CARDIAC CATHETERIZATION: SHX172

## 2015-06-14 LAB — CBC
HEMATOCRIT: 40.3 % (ref 39.0–52.0)
Hemoglobin: 13.5 g/dL (ref 13.0–17.0)
MCH: 28.4 pg (ref 26.0–34.0)
MCHC: 33.5 g/dL (ref 30.0–36.0)
MCV: 84.8 fL (ref 78.0–100.0)
PLATELETS: 230 10*3/uL (ref 150–400)
RBC: 4.75 MIL/uL (ref 4.22–5.81)
RDW: 15 % (ref 11.5–15.5)
WBC: 10.1 10*3/uL (ref 4.0–10.5)

## 2015-06-14 LAB — BASIC METABOLIC PANEL
Anion gap: 7 (ref 5–15)
BUN: 17 mg/dL (ref 6–20)
CHLORIDE: 106 mmol/L (ref 101–111)
CO2: 22 mmol/L (ref 22–32)
CREATININE: 0.94 mg/dL (ref 0.61–1.24)
Calcium: 8.4 mg/dL — ABNORMAL LOW (ref 8.9–10.3)
GFR calc Af Amer: >60 mL/min (ref >60)
GFR calc non Af Amer: >60 mL/min (ref >60)
Glucose, Bld: 87 mg/dL (ref 65–99)
Potassium: 3.9 mmol/L (ref 3.5–5.1)
Sodium: 135 mmol/L (ref 135–145)

## 2015-06-14 LAB — LIPID PANEL
Cholesterol: 91 mg/dL (ref 0–200)
HDL: 26 mg/dL — ABNORMAL LOW (ref >40)
LDL Cholesterol: 50 mg/dL (ref 0–99)
Total CHOL/HDL Ratio: 3.5 RATIO
Triglycerides: 77 mg/dL (ref <150)
VLDL: 15 mg/dL (ref 0–40)

## 2015-06-14 LAB — POCT ACTIVATED CLOTTING TIME: Activated Clotting Time: 79 seconds

## 2015-06-14 LAB — TROPONIN I
Troponin I: 1.15 ng/mL (ref <0.031)
Troponin I: 2.82 ng/mL (ref <0.031)

## 2015-06-14 LAB — HEPARIN LEVEL (UNFRACTIONATED)

## 2015-06-14 LAB — MRSA PCR SCREENING: MRSA BY PCR: NEGATIVE

## 2015-06-14 SURGERY — LEFT HEART CATH AND CORONARY ANGIOGRAPHY

## 2015-06-14 MED ORDER — ASPIRIN 81 MG PO CHEW
81.0000 mg | CHEWABLE_TABLET | Freq: Every day | ORAL | Status: DC
  Administered 2015-06-15 – 2015-06-16 (×2): 81 mg via ORAL
  Filled 2015-06-14 (×2): qty 1

## 2015-06-14 MED ORDER — PRASUGREL HCL 10 MG PO TABS
10.0000 mg | ORAL_TABLET | Freq: Every day | ORAL | Status: DC
  Administered 2015-06-14 – 2015-06-16 (×3): 10 mg via ORAL
  Filled 2015-06-14 (×3): qty 1

## 2015-06-14 MED ORDER — HEPARIN (PORCINE) IN NACL 2-0.9 UNIT/ML-% IJ SOLN
INTRAMUSCULAR | Status: AC
  Filled 2015-06-14: qty 1000

## 2015-06-14 MED ORDER — NITROGLYCERIN 1 MG/10 ML FOR IR/CATH LAB
INTRA_ARTERIAL | Status: DC | PRN
  Administered 2015-06-14: 12:00:00

## 2015-06-14 MED ORDER — MORPHINE SULFATE 2 MG/ML IJ SOLN: 1.0000 mg | Freq: Once | INTRAMUSCULAR | Status: DC

## 2015-06-14 MED ORDER — SODIUM CHLORIDE 0.9 % IV SOLN
INTRAVENOUS | Status: DC
  Administered 2015-06-14: 10 mL/h via INTRAVENOUS

## 2015-06-14 MED ORDER — ZOLPIDEM TARTRATE 5 MG PO TABS
5.0000 mg | ORAL_TABLET | Freq: Every evening | ORAL | Status: DC | PRN
  Administered 2015-06-14 – 2015-06-15 (×2): 5 mg via ORAL
  Filled 2015-06-14 (×2): qty 1

## 2015-06-14 MED ORDER — SODIUM CHLORIDE 0.9 % IJ SOLN
3.0000 mL | INTRAMUSCULAR | Status: DC | PRN
Start: 2015-06-14 — End: 2015-06-14

## 2015-06-14 MED ORDER — MORPHINE SULFATE 2 MG/ML IJ SOLN
2.0000 mg | INTRAMUSCULAR | Status: DC | PRN
  Administered 2015-06-14: 2 mg via INTRAVENOUS
  Filled 2015-06-14: qty 1

## 2015-06-14 MED ORDER — ASPIRIN EC 81 MG PO TBEC: 81.0000 mg | DELAYED_RELEASE_TABLET | Freq: Every day | ORAL | Status: DC

## 2015-06-14 MED ORDER — SODIUM CHLORIDE 0.9 % IV SOLN: 250.0000 mL | INTRAVENOUS | Status: DC | PRN

## 2015-06-14 MED ORDER — ACETAMINOPHEN 325 MG PO TABS
650.0000 mg | ORAL_TABLET | ORAL | Status: DC | PRN
  Administered 2015-06-16: 650 mg via ORAL
  Filled 2015-06-14: qty 2

## 2015-06-14 MED ORDER — ONDANSETRON HCL 4 MG/2ML IJ SOLN: 4.0000 mg | Freq: Four times a day (QID) | INTRAMUSCULAR | Status: DC | PRN

## 2015-06-14 MED ORDER — CETYLPYRIDINIUM CHLORIDE 0.05 % MT LIQD: 7.0000 mL | Freq: Two times a day (BID) | OROMUCOSAL | Status: DC

## 2015-06-14 MED ORDER — ASPIRIN EC 81 MG PO TBEC
81.0000 mg | DELAYED_RELEASE_TABLET | Freq: Every day | ORAL | Status: AC
  Filled 2015-06-14: qty 1

## 2015-06-14 MED ORDER — SODIUM CHLORIDE 0.9 % WEIGHT BASED INFUSION: 3.0000 mL/kg/h | INTRAVENOUS | Status: AC

## 2015-06-14 MED ORDER — MORPHINE SULFATE 2 MG/ML IJ SOLN
1.0000 mg | Freq: Once | INTRAMUSCULAR | Status: AC
  Administered 2015-06-14: 1 mg via INTRAVENOUS

## 2015-06-14 MED ORDER — MORPHINE SULFATE 2 MG/ML IJ SOLN
INTRAMUSCULAR | Status: AC
  Filled 2015-06-14: qty 1

## 2015-06-14 MED ORDER — SODIUM CHLORIDE 0.9 % IJ SOLN: 3.0000 mL | INTRAMUSCULAR | Status: DC | PRN

## 2015-06-14 MED ORDER — MORPHINE SULFATE 2 MG/ML IJ SOLN
2.0000 mg | INTRAMUSCULAR | Status: DC | PRN
  Administered 2015-06-14 (×3): 2 mg via INTRAVENOUS
  Filled 2015-06-14 (×3): qty 1

## 2015-06-14 MED ORDER — LIDOCAINE HCL (PF) 1 % IJ SOLN
INTRAMUSCULAR | Status: AC
  Filled 2015-06-14: qty 30

## 2015-06-14 MED ORDER — SODIUM CHLORIDE 0.9 % IJ SOLN
3.0000 mL | Freq: Two times a day (BID) | INTRAMUSCULAR | Status: DC
Start: 2015-06-14 — End: 2015-06-16
  Administered 2015-06-15 (×2): 3 mL via INTRAVENOUS

## 2015-06-14 MED ORDER — IOHEXOL 350 MG/ML SOLN
INTRAVENOUS | Status: DC | PRN
  Administered 2015-06-14: 45 mL via INTRACARDIAC

## 2015-06-14 MED ORDER — MIDAZOLAM HCL 2 MG/2ML IJ SOLN
INTRAMUSCULAR | Status: AC
  Filled 2015-06-14: qty 2

## 2015-06-14 MED ORDER — SODIUM CHLORIDE 0.9 % WEIGHT BASED INFUSION
3.0000 mL/kg/h | INTRAVENOUS | Status: DC
  Administered 2015-06-14: 3 mL/kg/h via INTRAVENOUS

## 2015-06-14 MED ORDER — SODIUM CHLORIDE 0.9 % WEIGHT BASED INFUSION: 1.0000 mL/kg/h | INTRAVENOUS | Status: DC

## 2015-06-14 MED ORDER — MIDAZOLAM HCL 2 MG/2ML IJ SOLN
INTRAMUSCULAR | Status: DC | PRN
  Administered 2015-06-14: 1 mg via INTRAVENOUS

## 2015-06-14 MED ORDER — ASPIRIN 81 MG PO CHEW
81.0000 mg | CHEWABLE_TABLET | ORAL | Status: AC
  Administered 2015-06-14: 81 mg via ORAL
  Filled 2015-06-14: qty 1

## 2015-06-14 MED ORDER — FENTANYL CITRATE (PF) 100 MCG/2ML IJ SOLN
INTRAMUSCULAR | Status: DC | PRN
  Administered 2015-06-14: 25 ug via INTRAVENOUS

## 2015-06-14 MED ORDER — NITROGLYCERIN 1 MG/10 ML FOR IR/CATH LAB
INTRA_ARTERIAL | Status: AC
  Filled 2015-06-14: qty 10

## 2015-06-14 MED ORDER — FENTANYL CITRATE (PF) 100 MCG/2ML IJ SOLN
INTRAMUSCULAR | Status: AC
  Filled 2015-06-14: qty 2

## 2015-06-14 MED ORDER — SODIUM CHLORIDE 0.9 % IJ SOLN: 3.0000 mL | Freq: Two times a day (BID) | INTRAMUSCULAR | Status: DC

## 2015-06-14 MED ORDER — NITROGLYCERIN IN D5W 200-5 MCG/ML-% IV SOLN: 0.0000 ug/min | INTRAVENOUS | Status: DC

## 2015-06-14 SURGICAL SUPPLY — 12 items
CATH INFINITI 5FR ANG PIGTAIL (CATHETERS) ×1 IMPLANT
CATH INFINITI 5FR MULTPACK ANG (CATHETERS) ×2 IMPLANT
CATH OPTITORQUE TIG 4.0 5F (CATHETERS) ×1 IMPLANT
GLIDESHEATH SLEND A-KIT 6F 22G (SHEATH) ×1 IMPLANT
KIT HEART LEFT (KITS) ×3 IMPLANT
PACK CARDIAC CATHETERIZATION (CUSTOM PROCEDURE TRAY) ×3 IMPLANT
SHEATH PINNACLE 5F 10CM (SHEATH) ×2 IMPLANT
SYR MEDRAD MARK V 150ML (SYRINGE) ×3 IMPLANT
TRANSDUCER W/STOPCOCK (MISCELLANEOUS) ×3 IMPLANT
TUBING CIL FLEX 10 FLL-RA (TUBING) ×1 IMPLANT
WIRE EMERALD 3MM-J .035X150CM (WIRE) ×2 IMPLANT
WIRE SAFE-T 1.5MM-J .035X260CM (WIRE) ×1 IMPLANT

## 2015-06-14 NOTE — Interval H&P Note (Signed)
Cath Lab Visit (complete for each Cath Lab visit)  Clinical Evaluation Leading to the Procedure:   ACS: Yes.    Non-ACS:    Anginal Classification: CCS IV  Anti-ischemic medical therapy: Minimal Therapy (1 class of medications)  Non-Invasive Test Results: No non-invasive testing performed  Prior CABG: No previous CABG      History and Physical Interval Note:  06/14/2015 11:25 AM  Kerry Mason  has presented today for surgery, with the diagnosis of cp./non-stemi  The various methods of treatment have been discussed with the patient and family. After consideration of risks, benefits and other options for treatment, the patient has consented to  Procedure(s): Left Heart Cath and Coronary Angiography (N/A) as a surgical intervention .  The patient's history has been reviewed, patient examined, no change in status, stable for surgery.  I have reviewed the patient's chart and labs.  Questions were answered to the patient's satisfaction.     Quay Burow

## 2015-06-14 NOTE — Progress Notes (Signed)
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  Now presents  with recurrent chest pain and positive enzymes consistent with a non-ST segment elevation myocardial infarction. He had some morphine this morning. He's relatively pain-free.  His ECG reveals worsening T-wave inversions in the anterior leads.  Objective:    Vital Signs:   Temp:  [97.4 F (36.3 C)-98.5 F (36.9 C)] 98.1 F (36.7 C) (07/11 0730) Pulse Rate:  [54-112] 78 (07/11 0700) Resp:  [9-31] 18 (07/11 0700) BP: (101-157)/(53-122) 140/93 mmHg (07/11 0700) SpO2:  [91 %-100 %] 100 % (07/11 0700) Weight:  [90 kg (198 lb 6.6 oz)-92.534 kg (204 lb)] 90 kg (198 lb 6.6 oz) (07/11 0400)  Last BM Date: 06/13/15   24-hour weight change: Weight change:   Weight trends: Filed Weights   06/13/15 1824 06/13/15 2336 06/14/15 0400  Weight: 92.534 kg (204 lb) 91.128 kg (200 lb 14.4 oz) 90 kg (198 lb 6.6 oz)    Intake/Output:  07/10 0701 - 07/11 0700 In: 200.1 [I.V.:200.1] Out: 550 [Urine:550]     Physical Exam: BP 140/93 mmHg  Pulse 78  Temp(Src) 98.1 F (36.7 C) (Oral)  Resp 18  Ht 6\' 1"  (1.854 m)  Wt 90 kg (198 lb 6.6 oz)  BMI 26.18 kg/m2  SpO2 100%  Wt Readings from Last 3 Encounters:  06/14/15 90 kg (198 lb 6.6 oz)  03/15/15 91.717 kg (202 lb 3.2 oz)  03/11/15 92 kg (202 lb 13.2 oz)    General: Vital signs reviewed and noted.   Head: Normocephalic, atraumatic.  Eyes: conjunctivae/corneas clear.  EOM's intact.   Throat: normal  Neck:  normal   Lungs:     Clear   Heart:  RR , normal jS1S2   Abdomen:  Soft, non-tender, non-distended    Extremities: Right radial pulse is 2+    Neurologic: A&O X3, CN II - XII are grossly intact.   Psych: Normal     Labs: BMET:  Recent Labs  06/13/15 1830 06/14/15 0429  NA 137 135  K 4.5 3.9  CL 106 106  CO2 22 22  GLUCOSE 123* 87  BUN 18 17    CREATININE 1.28* 0.94  CALCIUM 9.2 8.4*    Liver function tests: No results for input(s): AST, ALT, ALKPHOS, BILITOT, PROT, ALBUMIN in the last 72 hours. No results for input(s): LIPASE, AMYLASE in the last 72 hours.  CBC:  Recent Labs  06/13/15 1830 06/14/15 0429  WBC 10.3 10.1  HGB 15.3 13.5  HCT 44.9 40.3  MCV 86.0 84.8  PLT 290 230    Cardiac Enzymes:  Recent Labs  06/13/15 1830 06/14/15 0056 06/14/15 0429  TROPONINI <0.03 1.15* 2.82*    Coagulation Studies:  Recent Labs  06/13/15 1830  LABPROT 14.6  INR 1.13    Other: Invalid input(s): POCBNP No results for input(s): DDIMER in the last 72 hours. No results for input(s): HGBA1C in the last 72 hours.  Recent Labs  06/14/15 0429  CHOL 91  HDL 26*  LDLCALC 50  TRIG 77  CHOLHDL 3.5   No results for input(s): TSH, T4TOTAL, T3FREE, THYROIDAB in the last 72 hours.  Invalid input(s): FREET3 No results for input(s): VITAMINB12, FOLATE, FERRITIN, TIBC, IRON, RETICCTPCT in the last 72 hours.   Other results:  EKG  ( personally reviewed )  -  normal  sinus rhythm. He has worsening T-wave inversion in the anterior lateral leads.  Medications:    Infusions: . sodium chloride 10 mL/hr at 06/14/15 0700  . heparin 1,300 Units/hr (06/14/15 0700)  . nitroGLYCERIN      Scheduled Medications: . allopurinol  300 mg Oral Daily  . antiseptic oral rinse  7 mL Mouth Rinse BID  . aspirin EC  81 mg Oral Daily  . atorvastatin  40 mg Oral q1800  . carvedilol  6.25 mg Oral BID WC  . clopidogrel  75 mg Oral Q breakfast  . furosemide  20 mg Oral Daily  . lisinopril  5 mg Oral Daily  . magnesium oxide  400 mg Oral Daily  . omega-3 acid ethyl esters  1 g Oral Daily  . spironolactone  25 mg Oral QHS    Assessment/ Plan:   Active Problems:   Chest pain   1. Unstable angina/non-ST segment elevation of cardiac infarction. The patient now presents with recurrent chest pain. His pains worsen or to his presenting  pains back in March. These pains are associated with EKG changes and also an elevated troponin ( 2.82)  We discussed repeat cardiac catheterization. We've discussed the risks benefits and options. He understands and agrees to proceed.  2. Hyperlipidemia: The patient's cholesterol is very well-controlled. His LDL is 50. Total cholesterol is 91. Triglyceride level is 77. Continue atorvastatin.  3. Chronic systolic congestive heart failure: The patient has an ejection fraction of around 30. Continue current medications.  4. Essential hypertension: Continue medications.   Disposition:  Length of Stay: 1  Thayer Headings, Brooke Bonito., MD, [facility] 06/14/2015, 8:35 AM Office [phone] Pager [phone]

## 2015-06-14 NOTE — Progress Notes (Signed)
Site area: rt groin Site Prior to Removal:  Level 0 Pressure Applied For:  20 minutes Manual:   yes Patient Status During Pull:  stable Post Pull Site:  Level  0 Post Pull Instructions Given:  yes Post Pull Pulses Present: yes Dressing Applied:  tegaderm Bedrest begins @ 1240 Comments: none

## 2015-06-14 NOTE — H&P (View-Only) (Signed)
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  Now presents  with recurrent chest pain and positive enzymes consistent with a non-ST segment elevation myocardial infarction. He had some morphine this morning. He's relatively pain-free.  His ECG reveals worsening T-wave inversions in the anterior leads.  Objective:    Vital Signs:   Temp:  [97.4 F (36.3 C)-98.5 F (36.9 C)] 98.1 F (36.7 C) (07/11 0730) Pulse Rate:  [54-112] 78 (07/11 0700) Resp:  [9-31] 18 (07/11 0700) BP: (101-157)/(53-122) 140/93 mmHg (07/11 0700) SpO2:  [91 %-100 %] 100 % (07/11 0700) Weight:  [90 kg (198 lb 6.6 oz)-92.534 kg (204 lb)] 90 kg (198 lb 6.6 oz) (07/11 0400)  Last BM Date: 06/13/15   24-hour weight change: Weight change:   Weight trends: Filed Weights   06/13/15 1824 06/13/15 2336 06/14/15 0400  Weight: 92.534 kg (204 lb) 91.128 kg (200 lb 14.4 oz) 90 kg (198 lb 6.6 oz)    Intake/Output:  07/10 0701 - 07/11 0700 In: 200.1 [I.V.:200.1] Out: 550 [Urine:550]     Physical Exam: BP 140/93 mmHg  Pulse 78  Temp(Src) 98.1 F (36.7 C) (Oral)  Resp 18  Ht 6\' 1"  (1.854 m)  Wt 90 kg (198 lb 6.6 oz)  BMI 26.18 kg/m2  SpO2 100%  Wt Readings from Last 3 Encounters:  06/14/15 90 kg (198 lb 6.6 oz)  03/15/15 91.717 kg (202 lb 3.2 oz)  03/11/15 92 kg (202 lb 13.2 oz)    General: Vital signs reviewed and noted.   Head: Normocephalic, atraumatic.  Eyes: conjunctivae/corneas clear.  EOM's intact.   Throat: normal  Neck:  normal   Lungs:     Clear   Heart:  RR , normal jS1S2   Abdomen:  Soft, non-tender, non-distended    Extremities: Right radial pulse is 2+    Neurologic: A&O X3, CN II - XII are grossly intact.   Psych: Normal     Labs: BMET:  Recent Labs  06/13/15 1830 06/14/15 0429  NA 137 135  K 4.5 3.9  CL 106 106  CO2 22 22  GLUCOSE 123* 87  BUN 18 17    CREATININE 1.28* 0.94  CALCIUM 9.2 8.4*    Liver function tests: No results for input(s): AST, ALT, ALKPHOS, BILITOT, PROT, ALBUMIN in the last 72 hours. No results for input(s): LIPASE, AMYLASE in the last 72 hours.  CBC:  Recent Labs  06/13/15 1830 06/14/15 0429  WBC 10.3 10.1  HGB 15.3 13.5  HCT 44.9 40.3  MCV 86.0 84.8  PLT 290 230    Cardiac Enzymes:  Recent Labs  06/13/15 1830 06/14/15 0056 06/14/15 0429  TROPONINI <0.03 1.15* 2.82*    Coagulation Studies:  Recent Labs  06/13/15 1830  LABPROT 14.6  INR 1.13    Other: Invalid input(s): POCBNP No results for input(s): DDIMER in the last 72 hours. No results for input(s): HGBA1C in the last 72 hours.  Recent Labs  06/14/15 0429  CHOL 91  HDL 26*  LDLCALC 50  TRIG 77  CHOLHDL 3.5   No results for input(s): TSH, T4TOTAL, T3FREE, THYROIDAB in the last 72 hours.  Invalid input(s): FREET3 No results for input(s): VITAMINB12, FOLATE, FERRITIN, TIBC, IRON, RETICCTPCT in the last 72 hours.   Other results:  EKG  ( personally reviewed )  -  normal  sinus rhythm. He has worsening T-wave inversion in the anterior lateral leads.  Medications:    Infusions: . sodium chloride 10 mL/hr at 06/14/15 0700  . heparin 1,300 Units/hr (06/14/15 0700)  . nitroGLYCERIN      Scheduled Medications: . allopurinol  300 mg Oral Daily  . antiseptic oral rinse  7 mL Mouth Rinse BID  . aspirin EC  81 mg Oral Daily  . atorvastatin  40 mg Oral q1800  . carvedilol  6.25 mg Oral BID WC  . clopidogrel  75 mg Oral Q breakfast  . furosemide  20 mg Oral Daily  . lisinopril  5 mg Oral Daily  . magnesium oxide  400 mg Oral Daily  . omega-3 acid ethyl esters  1 g Oral Daily  . spironolactone  25 mg Oral QHS    Assessment/ Plan:   Active Problems:   Chest pain   1. Unstable angina/non-ST segment elevation of cardiac infarction. The patient now presents with recurrent chest pain. His pains worsen or to his presenting  pains back in March. These pains are associated with EKG changes and also an elevated troponin ( 2.82)  We discussed repeat cardiac catheterization. We've discussed the risks benefits and options. He understands and agrees to proceed.  2. Hyperlipidemia: The patient's cholesterol is very well-controlled. His LDL is 50. Total cholesterol is 91. Triglyceride level is 77. Continue atorvastatin.  3. Chronic systolic congestive heart failure: The patient has an ejection fraction of around 30. Continue current medications.  4. Essential hypertension: Continue medications.   Disposition:  Length of Stay: 1  Thayer Headings, Brooke Bonito., MD, [facility] 06/14/2015, 8:35 AM Office [phone] Pager [phone]

## 2015-06-14 NOTE — Progress Notes (Signed)
ANTICOAGULATION CONSULT NOTE - Follow Up Consult  Pharmacy Consult for Heparin  Indication: chest pain/ACS   Assessment: Sub-therapeutic heparin level. Per RN, heparin was likely off for several hours and has only been running since ~0400.   Goal of Therapy:  Heparin level 0.3-0.7 units/ml Monitor platelets by anticoagulation protocol: Yes   Plan:  -Will continue heparin at 1300 units/hr given off time earlier tonight -Check 1100 HL -Daily CBC/HL -Monitor for bleeding  No Known Allergies  Patient Measurements: Height: 6\' 1"  (185.4 cm) Weight: 198 lb 6.6 oz (90 kg) IBW/kg (Calculated) : 79.9  Vital Signs: Temp: 97.4 F (36.3 C) (07/11 0400) Temp Source: Oral (07/11 0400) BP: 114/55 mmHg (07/11 0137) Pulse Rate: 57 (07/11 0137)  Labs:  Recent Labs  06/13/15 1830 06/14/15 0056 06/14/15 0429  HGB 15.3  --  13.5  HCT 44.9  --  40.3  PLT 290  --  230  APTT 32  --   --   LABPROT 14.6  --   --   INR 1.13  --   --   HEPARINUNFRC  --   --  <0.10*  CREATININE 1.28*  --   --   TROPONINI <0.03 1.15*  --     Estimated Creatinine Clearance: 70.2 mL/min (by C-G formula based on Cr of 1.28).  Narda Bonds 06/14/2015,5:20 AM

## 2015-06-14 NOTE — Research (Signed)
DAL-GENE Informed Consent   Subject Name: Kerry Mason  Subject met inclusion and exclusion criteria.  The informed consent form, study requirements and expectations were reviewed with the subject and questions and concerns were addressed prior to the signing of the consent form.  The subject verbalized understanding of the trail requirements.  The subject agreed to participate in the DAL-GENE trial and signed the informed consent.  The informed consent was obtained prior to performance of any protocol-specific procedures for the subject.  A copy of the signed informed consent was given to the subject and a copy was placed in the subject's medical record.  Hedrick,Onisha Cedeno W 06/14/2015, 1430

## 2015-06-14 NOTE — Progress Notes (Signed)
Pt ambulated around the unit x1 (329ft). Stated "it feels good to walk". Blood pressure upon arrival 108/62. O2 97% on RA. No respiratory distress. No increased chest pain. Pt currently resting comfortably in bed with no complaints. Will continue to monitor.

## 2015-06-14 NOTE — Care Management Note (Signed)
Case Management Note  Patient Details  Name: Kerry Mason MRN: [id number] Date of Birth: Apr 14, 1956  Subjective/Objective:      Adm w ch pain, ekg changes             Action/Plan: lives w wife, pcp dr Ardeth Perfect   Expected Discharge Date:                  Expected Discharge Plan:     In-House Referral:     Discharge planning Services     Post Acute Care Choice:    Choice offered to:     DME Arranged:    DME Agency:     HH Arranged:    [place] Agency:     Status of Service:     Medicare Important Message Given:    Date Medicare IM Given:    Medicare IM give by:    Date Additional Medicare IM Given:    Additional Medicare Important Message give by:     If discussed at [place] of Stay Meetings, dates discussed:    Additional Comments: ur review done  Lacretia Leigh, RN 06/14/2015, 9:56 AM

## 2015-06-14 NOTE — Progress Notes (Signed)
Patient arrived on unit shortly prior to 0000 with Nitro @ 5 units infusing. Heparin was resumed @ 1300 units as ordered. Mild H/A (4/10) and mild  Right sided C/P (3/10). O2 @ 2L applied with some positive effect. Labs were then drawn. Waiting for results. Lab called @ 0220 with Troponin level of 1.15. MD called & patient ransferred to Stepdown for Nitro gtt.

## 2015-06-15 ENCOUNTER — Ambulatory Visit: Payer: Medicaid Other | Admitting: Cardiology

## 2015-06-15 DIAGNOSIS — I214 Non-ST elevation (NSTEMI) myocardial infarction: Secondary | ICD-10-CM

## 2015-06-15 DIAGNOSIS — I209 Angina pectoris, unspecified: Secondary | ICD-10-CM

## 2015-06-15 LAB — BASIC METABOLIC PANEL
ANION GAP: 7 (ref 5–15)
BUN: 12 mg/dL (ref 6–20)
CALCIUM: 8.7 mg/dL — AB (ref 8.9–10.3)
CO2: 23 mmol/L (ref 22–32)
CREATININE: 0.85 mg/dL (ref 0.61–1.24)
Chloride: 107 mmol/L (ref 101–111)
GFR calc non Af Amer: >60 mL/min (ref >60)
Glucose, Bld: 94 mg/dL (ref 65–99)
Potassium: 3.9 mmol/L (ref 3.5–5.1)
Sodium: 137 mmol/L (ref 135–145)

## 2015-06-15 MED ORDER — ISOSORBIDE MONONITRATE ER 30 MG PO TB24
30.0000 mg | ORAL_TABLET | Freq: Every day | ORAL | Status: DC
  Administered 2015-06-15 – 2015-06-16 (×2): 30 mg via ORAL
  Filled 2015-06-15 (×2): qty 1

## 2015-06-15 NOTE — Progress Notes (Signed)
Patient transferred to 2 [place].

## 2015-06-15 NOTE — Progress Notes (Signed)
Gave pt 30day free effient card. Pt has medicaid so copay will be around 3.00 per month.

## 2015-06-15 NOTE — Progress Notes (Signed)
Subjective:    Day of hospitalization: 2  VSS.  No overnight events.  Pt is feeling good, no episodes of CP this AM.  Has been up walking several laps around the units without pain.    Objective:   Temp:  [98.1 F (36.7 C)-98.5 F (36.9 C)] 98.1 F (36.7 C) (07/12 0400) Pulse Rate:  [0-68] 59 (07/11 1623) Resp:  [0-20] 18 (07/12 0400) BP: (100-137)/(55-108) 131/80 mmHg (07/12 0400) SpO2:  [0 %-100 %] 99 % (07/12 0400) Weight:  [92.2 kg (203 lb 4.2 oz)] 92.2 kg (203 lb 4.2 oz) (07/12 0500) Last BM Date: 06/13/15  Filed Weights   06/14/15 0400 06/14/15 0848 06/15/15 0500  Weight: 90 kg (198 lb 6.6 oz) 90.9 kg (200 lb 6.4 oz) 92.2 kg (203 lb 4.2 oz)    Intake/Output Summary (Last 24 hours) at 06/15/15 0907 Last data filed at 06/15/15 0600  Gross per 24 hour  Intake 1181.9 ml  Output   1125 ml  Net   56.9 ml    Telemetry: NSR  Physical Exam: General: NAD. HEENT: Conjunctiva and lids normal, oropharynx clear. Lungs: CTAB, nonlabored. Cardiac: RRR, no m/r/g. Abdomen: +BS, NT/ND.   Extremities: No LE edema. R femoral cath site without hematoma. Neuro: Alert and oriented x3. Moving all extremities.   Lab Results:  Basic Metabolic Panel:  Recent Labs Lab 06/13/15 1830 06/14/15 0429 06/15/15 0218  NA 137 135 137  K 4.5 3.9 3.9  CL 106 106 107  CO2 22 22 23   GLUCOSE 123* 87 94  BUN 18 17 12   CREATININE 1.28* 0.94 0.85  CALCIUM 9.2 8.4* 8.7*    Liver Function Tests: No results for input(s): AST, ALT, ALKPHOS, BILITOT, PROT, ALBUMIN in the last 168 hours.  CBC:  Recent Labs Lab 06/13/15 1830 06/14/15 0429  WBC 10.3 10.1  HGB 15.3 13.5  HCT 44.9 40.3  MCV 86.0 84.8  PLT 290 230    Cardiac Enzymes:  Recent Labs Lab 06/13/15 1830 06/14/15 0056 06/14/15 0429  TROPONINI <0.03 1.15* 2.82*    BNP: No results for input(s): PROBNP in the last 8760 hours.  Coagulation:  Recent Labs Lab 06/13/15 1830  INR 1.13    Radiology: Dg Chest  Port 1 View  06/13/2015   CLINICAL DATA:  Chest pain  EXAM: PORTABLE CHEST - 1 VIEW  COMPARISON:  02/05/2015  FINDINGS: Lungs are clear.  No pleural effusion or pneumothorax.  The heart is normal in size.  Right chest ICD.  IMPRESSION: No evidence of acute cardiopulmonary disease.   Electronically Signed   By: Julian Hy M.D.   On: 06/13/2015 19:01     ECG:   Medications:   Scheduled Medications: . allopurinol  300 mg Oral Daily  . aspirin  81 mg Oral Daily  . atorvastatin  40 mg Oral q1800  . carvedilol  6.25 mg Oral BID WC  . furosemide  20 mg Oral Daily  . lisinopril  5 mg Oral Daily  . magnesium oxide  400 mg Oral Daily  . omega-3 acid ethyl esters  1 g Oral Daily  . prasugrel  10 mg Oral Daily  . sodium chloride  3 mL Intravenous Q12H  . spironolactone  25 mg Oral QHS    Infusions: . sodium chloride Stopped (06/14/15 0920)  . nitroGLYCERIN 10 mcg/min (06/15/15 0855)    PRN Medications: sodium chloride, acetaminophen, morphine injection, nitroGLYCERIN, ondansetron (ZOFRAN) IV, ondansetron (ZOFRAN) IV, sodium chloride, zolpidem   Assessment  and Plan:   NSTEMI LHC yesterday showed mid to distal RCA 100% stenosed previously tx with stent.  LM was 50% stenosed.  Dr. Martinique did not feel placing a 3rd layer stent would be appropriate currently and should cont medical mgmt.  Denies CP today.  -cont ASA/effient  -would d/c nitro gtt, add imdur  -ambulate  -transfer to tele   PAF Spoke with Dr. Martinique and would cont ASA/effient so would not begin eliquis.  Has been in NSR here.  Not currently on amiodarone.   -cont monitoring   Chronic systolic HF -cont current meds  HTN -cont current meds  Dyslipidemia -cont current meds    Jones Bales, MD PGY-3, Internal Medicine Teaching Service 06/15/2015, 9:07 AM   Attending Note:   The patient was seen and examined.  Agree with assessment and plan as noted above.  Changes made to the above note as  needed.  Cath yesterday show that the RCA has now restenosis and is occluded.  He has intact collateral from the left system . No pain at present. Will add Imdur 30.  Transfer to tele.  Ambulate. Possible DC to home if he is stable. Will need to consider CABG if he fails medical therapy.  I agree with Dr. Martinique that re-PCI of the RCA would likely  not be successful. In addition, he has a 50 % LM stenosis   Thayer Headings, Brooke Bonito., MD, [facility] 06/15/2015, 9:36 AM [address]  [place] Pager [phone]

## 2015-06-15 NOTE — Progress Notes (Signed)
NITRO titrated to OFF.   Kerry Mason

## 2015-06-16 ENCOUNTER — Telehealth: Payer: Self-pay | Admitting: Cardiology

## 2015-06-16 ENCOUNTER — Telehealth: Payer: Self-pay | Admitting: Physician Assistant

## 2015-06-16 ENCOUNTER — Other Ambulatory Visit: Payer: Self-pay | Admitting: Cardiology

## 2015-06-16 ENCOUNTER — Encounter (HOSPITAL_COMMUNITY): Payer: Medicaid Other

## 2015-06-16 DIAGNOSIS — I5022 Chronic systolic (congestive) heart failure: Secondary | ICD-10-CM

## 2015-06-16 DIAGNOSIS — I480 Paroxysmal atrial fibrillation: Secondary | ICD-10-CM

## 2015-06-16 DIAGNOSIS — I10 Essential (primary) hypertension: Secondary | ICD-10-CM

## 2015-06-16 MED ORDER — BISACODYL 5 MG PO TBEC
5.0000 mg | DELAYED_RELEASE_TABLET | Freq: Every day | ORAL | Status: DC | PRN
  Administered 2015-06-16: 5 mg via ORAL
  Filled 2015-06-16: qty 1

## 2015-06-16 MED ORDER — FUROSEMIDE 40 MG PO TABS: 40.0000 mg | ORAL_TABLET | Freq: Every day | ORAL | Status: DC

## 2015-06-16 MED ORDER — ISOSORBIDE MONONITRATE ER 30 MG PO TB24: 30.0000 mg | ORAL_TABLET | Freq: Every day | ORAL | Status: DC

## 2015-06-16 MED ORDER — ACETAMINOPHEN 325 MG PO TABS: 650.0000 mg | ORAL_TABLET | ORAL | Status: DC | PRN

## 2015-06-16 MED ORDER — PRASUGREL HCL 10 MG PO TABS: 10.0000 mg | ORAL_TABLET | Freq: Every day | ORAL | Status: DC

## 2015-06-16 MED ORDER — POTASSIUM CHLORIDE ER 10 MEQ PO TBCR: 10.0000 meq | EXTENDED_RELEASE_TABLET | Freq: Every day | ORAL | Status: DC

## 2015-06-16 NOTE — Progress Notes (Signed)
Pt given d/c instructions prior to d/c home; pt verbalized understanding of instructions; pt to d/c home with family; pt to see cardiac rehab prior to d/c home; will d/c IV and tele monitor after pt meets with cardiac rehab; will cont. To monitor.

## 2015-06-16 NOTE — Telephone Encounter (Signed)
Patient calling the office for samples of medication:   1.  What medication and dosage are you requesting samples for? Effient   2.  Are you currently out of this medication? It is a new medication and his pharmacy is out until Friday   3. Are you requesting samples to get you through until a mail order prescription arrives? Yes.   *HE WOULD LIKE TO KNOW IF HE COULD TAKE HIS PLAVIX IN THE PLACE OF THE EFFIENT UNTIL HE GETS IT*

## 2015-06-16 NOTE — Discharge Instructions (Signed)
Arteriogram  An arteriogram (or angiogram) is an X-ray test of your blood vessels. You will be awake during the test. This test looks for:  Blocked blood vessels.  Blood vessels that are not normal. BEFORE THE TEST Schedule an appointment for your arteriogram. Let the person know if:  You have diabetes.  You are allergic to any food or medicine.  You are allergic to X-ray dye (contrast).  You have asthma or had it when you were a child.  You are pregnant or you might be pregnant.  You are taking aspirin or blood thinners. The night before the test:   Do not  eat or drink anything after midnight. On the morning of your test:   Do not drive. Have someone bring you and take you home.  Bring all the medicines you need to take with you. If you have diabetes, do not take your insulin before the test, but bring it with you. THE TEST  You will lie on the X-ray table.  An IV tube is started in your arm.  Your blood pressure and heartbeat are checked during the test.  The upper part of your leg (groin) is shaved and washed with special soap.  You are then covered with a germ free (sterile) sheet. Keep your arms at your side under the sheet at all times so that germs do not get on the sheet.  The skin is numbed where the thin tube (catheter) is put into your upper leg. The thin tube is moved up into the blood vessels that your doctor wants to see.  Dye is put in through the tube. You may have a feeling of heat as the dye moves into your body.  X-ray pictures of your blood vessels are taken. Do not move while the dye goes in and pictures are taken. AFTER THE TEST  The thin tube will be taken out of your upper leg.  The nurse will check:  Your blood pressure.  The place where the thin tube was taken out to make sure it is not bleeding.  The blood flow to your feet.  Lie flat in bed. Keep your leg straight for 6 hours after the thin tube is taken out.  Ask your doctor or  nurse if you should take your regular medications that you brought. Finding out the results of your test Ask when your test results will be ready. Make sure you get your test results. Document Released: 02/16/2009 Document Revised: 11/25/2013 Document Reviewed: 02/16/2009 [facility] Patient Information 2015 [place], [place]. This information is not intended to replace advice given to you by your health care provider. Make sure you discuss any questions you have with your health care provider.   Have lab work done next Wednesday at the office (BMP)

## 2015-06-16 NOTE — Progress Notes (Signed)
IV and tele monitor d/c at this time; pt to d/c home after lunch per pt request; will cont. To monitor.

## 2015-06-16 NOTE — Discharge Summary (Signed)
Patient ID: Kerry Mason,  MRN: [id number], DOB/AGE: [birth year] 58 y.o.  Admit date: 06/13/2015 Discharge date: 06/16/2015  Primary Care Provider: Velna Hatchet, MD Primary Cardiologist: Dr Martinique  Discharge Diagnoses Principal Problem:   Unstable angina Active Problems:   ISR RCA DES- plan medical Rx   NSTEMI (non-ST elevated myocardial infarction)   Chronic systolic CHF (congestive heart failure), NYHA class 3   Cardiomyopathy, ischemic-EF 25%   HTN (hypertension)   CAD S/P multiple PCI- last March 7341 to RCA complicated by tamponade   Hyperlipidemia   ICD in place   Cardiac tamponade-post PCI March 2016   PAF- Amiodarone stopped, holding NSR    Procedures:  Coronary angiogram 06/15/15   Hospital Course: 59M with CAD s/p previous PCI to RCA and LAD, presented with a STEMI March 2016 and was treated with PCI (DES-->LAD x2 2015) and PCI of CTO RCA. This was complicated by perforation and tamponade. Other problems include, HTN, HL, chronic systolic HF EF 93-79%, s/p ICD, and PAF. He presented 06/13/15 with unstable angina and ruled in for a NSTEMI. Cath done 06/15/15 revealed occlusion of the RCA stent. Plan is for medical Rx, consideration of CABG if this fails. Pt was seen by Dr Acie Fredrickson the morning of the 79 th and felt to be stable for DC. His Lasix was increased at discharge and low dose potasium added. He will need a BMP and a TOC office visit in 7-10 days. It was decided not to send the pt home on Amiodarone, he has been holding NSR. Plavix was changed to Effient.   Discharge Vitals:  Blood pressure 157/95, pulse 69, temperature 98.2 F (36.8 C), temperature source Oral, resp. rate 20, height 6\' 1"  (1.854 m), weight 197 lb 12 oz (89.7 kg), SpO2 99 %.    Labs: No results found for this or any previous visit (from the past 24 hour(s)).  Disposition:      Follow-up Information    Follow up with Peter Martinique, MD.   Specialty:  Cardiology   Why:  office will contact  you   Contact information:   [address] [place] [address] [phone]       Discharge Medications:    Medication List    STOP taking these medications        amiodarone 200 MG tablet  Commonly known as:  PACERONE     clopidogrel 75 MG tablet  Commonly known as:  PLAVIX      TAKE these medications        acetaminophen 325 MG tablet  Commonly known as:  TYLENOL  Take 2 tablets (650 mg total) by mouth every 4 (four) hours as needed for headache or mild pain.     allopurinol 300 MG tablet  Commonly known as:  ZYLOPRIM  Take 1 tablet (300 mg total) by mouth daily.     aspirin EC 81 MG tablet  Take 81 mg by mouth daily.     atorvastatin 40 MG tablet  Commonly known as:  LIPITOR  Take 1 tablet (40 mg total) by mouth daily at 6 PM.     CALCIUM PO  Take 1 tablet by mouth daily.     carvedilol 6.25 MG tablet  Commonly known as:  COREG  Take 1 tablet (6.25 mg total) by mouth 2 (two) times daily.     FISH OIL PO  Take 1 capsule by mouth daily.     Fluocinonide 0.1 % Crea  Apply  1 application topically 3 (three) times daily as needed (rash - on legs, hands and bottom).     furosemide 40 MG tablet  Commonly known as:  LASIX  Take 1 tablet (40 mg total) by mouth daily.     isosorbide mononitrate 30 MG 24 hr tablet  Commonly known as:  IMDUR  Take 1 tablet (30 mg total) by mouth daily.     lisinopril 5 MG tablet  Commonly known as:  PRINIVIL,ZESTRIL  Take 1 tablet (5 mg total) by mouth daily.     Magnesium 400 MG Tabs  Take 400 mg by mouth daily.     multivitamin with minerals Tabs tablet  Take 1 tablet by mouth daily.     nitroGLYCERIN 0.4 MG SL tablet  Commonly known as:  NITROSTAT  Place 1 tablet (0.4 mg total) under the tongue every 5 (five) minutes as needed for chest pain.     potassium chloride 10 MEQ tablet  Commonly known as:  K-DUR  Take 1 tablet (10 mEq total) by mouth daily.     prasugrel 10 MG Tabs tablet  Commonly known  as:  EFFIENT  Take 1 tablet (10 mg total) by mouth daily.     spironolactone 25 MG tablet  Commonly known as:  ALDACTONE  Take 1 tablet (25 mg total) by mouth at bedtime.         Duration of Discharge Encounter: Greater than 30 minutes including physician time.  Angelena Form PA-C 06/16/2015 10:56 AM   Attending Note:   The patient was seen and examined.  Agree with assessment and plan as noted above.  Changes made to the above note as needed.  See my progress note from earlier today  Pt has a chronic total occlusion of his RCA  - had PCI of this several months ago but now its reoccluded.  Has a 50% left main stenosis . Will continue on medical therapy . Will refer for CABG if he has symptoms not controlled with medications  Thayer Headings, Brooke Bonito., MD, [facility] 06/16/2015, 1:43 PM [address]  [place] Pager [phone]

## 2015-06-16 NOTE — Telephone Encounter (Signed)
Toc per Staff messages  Appt per [facility] staff message with Richardson Dopp 07/18 @ 9:30am

## 2015-06-16 NOTE — Progress Notes (Signed)
[phone] Pt has been walking independently so did not walk. MI ed completed. Pt has been following very healthy diet and exercising. Discussed with pt getting back to his ex slowly because of MI. Encouraged effient use. Reviewed NTG use. Pt stated he will discuss CRP 2 with Dr Martinique as he tried it before but did not feel that he needed it as he was exercising at home. Discussed benefits of program. Very nice pt who has made changes including not smoking and loss of over 100 pounds in year and half.Graylon Good RN BSN 06/16/2015 11:54 AM

## 2015-06-16 NOTE — Progress Notes (Signed)
Subjective:    Day of hospitalization: 3  VSS.  No overnight events.  Pt is feeling good, no episodes of CP this AM.  Has been up walking several laps around the units without pain.    Objective:   Temp:  [98.2 F (36.8 C)-98.3 F (36.8 C)] 98.2 F (36.8 C) (07/13 0604) Pulse Rate:  [57-64] 64 (07/13 0604) Resp:  [20] 20 (07/13 0604) BP: (107-125)/(64-80) 107/64 mmHg (07/13 0604) SpO2:  [99 %-100 %] 99 % (07/13 0604) Weight:  [89.7 kg (197 lb 12 oz)] 89.7 kg (197 lb 12 oz) (07/13 0524) Last BM Date: 06/13/15  Filed Weights   06/14/15 0848 06/15/15 0500 06/16/15 0524  Weight: 90.9 kg (200 lb 6.4 oz) 92.2 kg (203 lb 4.2 oz) 89.7 kg (197 lb 12 oz)    Intake/Output Summary (Last 24 hours) at 06/16/15 1010 Last data filed at 06/16/15 0809  Gross per 24 hour  Intake    600 ml  Output      0 ml  Net    600 ml    Telemetry: NSR  Physical Exam: General: NAD. HEENT: Conjunctiva and lids normal, oropharynx clear. Lungs: CTAB, nonlabored. Cardiac: RRR, no m/r/g. Abdomen: +BS, NT/ND.   Extremities: No LE edema. R femoral cath site without hematoma. Neuro: Alert and oriented x3. Moving all extremities.   Lab Results:  Basic Metabolic Panel:  Recent Labs Lab 06/13/15 1830 06/14/15 0429 06/15/15 0218  NA 137 135 137  K 4.5 3.9 3.9  CL 106 106 107  CO2 22 22 23   GLUCOSE 123* 87 94  BUN 18 17 12   CREATININE 1.28* 0.94 0.85  CALCIUM 9.2 8.4* 8.7*    Liver Function Tests: No results for input(s): AST, ALT, ALKPHOS, BILITOT, PROT, ALBUMIN in the last 168 hours.  CBC:  Recent Labs Lab 06/13/15 1830 06/14/15 0429  WBC 10.3 10.1  HGB 15.3 13.5  HCT 44.9 40.3  MCV 86.0 84.8  PLT 290 230    Cardiac Enzymes:  Recent Labs Lab 06/13/15 1830 06/14/15 0056 06/14/15 0429  TROPONINI <0.03 1.15* 2.82*    BNP: No results for input(s): PROBNP in the last 8760 hours.  Coagulation:  Recent Labs Lab 06/13/15 1830  INR 1.13    Radiology: No results  found.   ECG:   Medications:   Scheduled Medications: . allopurinol  300 mg Oral Daily  . aspirin  81 mg Oral Daily  . atorvastatin  40 mg Oral q1800  . carvedilol  6.25 mg Oral BID WC  . furosemide  20 mg Oral Daily  . isosorbide mononitrate  30 mg Oral Daily  . lisinopril  5 mg Oral Daily  . magnesium oxide  400 mg Oral Daily  . omega-3 acid ethyl esters  1 g Oral Daily  . prasugrel  10 mg Oral Daily  . sodium chloride  3 mL Intravenous Q12H  . spironolactone  25 mg Oral QHS    Infusions: . sodium chloride Stopped (06/14/15 0920)    PRN Medications: sodium chloride, acetaminophen, morphine injection, nitroGLYCERIN, ondansetron (ZOFRAN) IV, sodium chloride, zolpidem   Assessment and Plan:   NSTEMI LHC yesterday showed mid to distal RCA 100% stenosed previously tx with stent.  LM was 50% stenosed.  Dr. Martinique did not feel placing a 3rd layer stent would be appropriate currently and should cont medical mgmt.  Denies CP today.  -cont ASA/effient  -would d/c nitro gtt, add imdur  -ambulate  -transfer to tele  PAF Spoke with Dr. Martinique and would cont ASA/effient so would not begin eliquis.  Has been in NSR here.  Not currently on amiodarone.   -cont monitoring   Chronic systolic HF -cont current meds  HTN -cont current meds  Dyslipidemia -cont current meds    Thayer Headings, MD PGY-3, Internal Medicine Teaching Service 06/16/2015, 10:10 AM   Attending Note:   The patient was seen and examined.  Agree with assessment and plan as noted above.  Changes made to the above note as needed.  1. CAD :  Cath Monday show that the RCA has now restenosis and is occluded.  He has intact collateral from the left system . No pain at present. Have added  Imdur 30.  Transfer to tele.  Ambulate. Possible DC to home if he is stable. Will need to consider CABG if he fails medical therapy.  I agree with Dr. Martinique that re-PCI of the RCA would likely  not be successful. In  addition, he has a 50 % LM stenosis Has ambulated in the halls without problems . Continue Effient / ASA  2. Chronic systolic CHF:    says that he become volume overloaded frequently. Will increase lasix to 40 , add Kdur 10 On coreg 6. 25 bid, Lisinopril 5 mg a day, Imdur 30 mg a day , Aldactone 25 a day   Will need a BMP in 1 week given increased lasix and kdur .    3. Paroxysmal atrial fib:  Off amiodarone at this point. Not on anticoagulation  ( had brief episode of PAF)  Discussed with Dr. Martinique.  He would prefer to continue Effient and ASA for now and not anticoagulate since he is staying in NSR  DC to home today .   Thayer Headings, Brooke Bonito., MD, [facility] 06/16/2015, 10:10 AM [address],  [place] Pager [phone]

## 2015-06-17 ENCOUNTER — Other Ambulatory Visit: Payer: Self-pay | Admitting: Nurse Practitioner

## 2015-06-17 NOTE — Telephone Encounter (Signed)
Patient contacted regarding discharge from [facility] on 06/16/15.  Patient understands to follow up with provider on 06/23/15 at 10 a.m. at [facility] office.  Patient understands discharge instructions?  Yes  Patient understands medications and regiment?  Yes  Patient understands to bring all medications to this visit?  Yes

## 2015-06-17 NOTE — Telephone Encounter (Signed)
Patient notified no samples are available. Provided patient with [facility] # to check on samples. Informed patient he NEEDS to take antiplatelet medications (has has some plavix pills left). He will check on samples, possible check on price of short supply of pills at another pharmacy, check with his [facility] on Rx status - will let us know if further assistance is needed.   Patient prefers to see Dr. Martinique for follow up. He has OV 7/18. Will send staff message to [facility] in case an opening with Dr. Martinique comes up for next week to contact patient

## 2015-06-18 ENCOUNTER — Encounter (HOSPITAL_COMMUNITY): Payer: Medicaid Other

## 2015-06-18 ENCOUNTER — Telehealth: Payer: Self-pay | Admitting: Cardiology

## 2015-06-18 ENCOUNTER — Telehealth: Payer: Self-pay | Admitting: *Deleted

## 2015-06-18 NOTE — Telephone Encounter (Signed)
Agree with plan Brian Crenshaw  

## 2015-06-18 NOTE — Telephone Encounter (Signed)
SPOKE TO PATIENT.  INFORMED patient Dr Stanford Breed usually has your primary to cover this type of medication. Recommend using Benadryl( generic) and also contacting primary Dr Allie Bossier Patient is aware will defer to Dr Stanford Breed.

## 2015-06-18 NOTE — Telephone Encounter (Signed)
i spoke with patient and let him know he did not have the genotype for the [facility] Gene study. Patient will not be able to be randomized into the study. Patient verbalized understanding.

## 2015-06-18 NOTE — Telephone Encounter (Signed)
Ok to refill? Please advise. Thanks, MI 

## 2015-06-18 NOTE — Telephone Encounter (Signed)
Patient states he had a another heart attack this past Sunday and is having problems sleeping.  Can we call something to the pharmacy for him?

## 2015-06-21 ENCOUNTER — Ambulatory Visit (INDEPENDENT_AMBULATORY_CARE_PROVIDER_SITE_OTHER): Payer: Self-pay | Admitting: Cardiology

## 2015-06-21 ENCOUNTER — Encounter: Payer: Self-pay | Admitting: Cardiology

## 2015-06-21 ENCOUNTER — Encounter (HOSPITAL_COMMUNITY): Payer: Medicaid Other

## 2015-06-21 ENCOUNTER — Encounter: Payer: Medicaid Other | Admitting: Physician Assistant

## 2015-06-21 VITALS — BP 98/74 | HR 86 | Ht 73.0 in | Wt 199.0 lb

## 2015-06-21 DIAGNOSIS — I2510 Atherosclerotic heart disease of native coronary artery without angina pectoris: Secondary | ICD-10-CM | POA: Diagnosis not present

## 2015-06-21 DIAGNOSIS — I2582 Chronic total occlusion of coronary artery: Secondary | ICD-10-CM

## 2015-06-21 DIAGNOSIS — I5022 Chronic systolic (congestive) heart failure: Secondary | ICD-10-CM | POA: Diagnosis not present

## 2015-06-21 DIAGNOSIS — Z9861 Coronary angioplasty status: Secondary | ICD-10-CM

## 2015-06-21 DIAGNOSIS — I255 Ischemic cardiomyopathy: Secondary | ICD-10-CM | POA: Diagnosis not present

## 2015-06-21 DIAGNOSIS — I10 Essential (primary) hypertension: Secondary | ICD-10-CM

## 2015-06-21 DIAGNOSIS — E785 Hyperlipidemia, unspecified: Secondary | ICD-10-CM

## 2015-06-21 MED ORDER — CLOPIDOGREL BISULFATE 75 MG PO TABS: 75.0000 mg | ORAL_TABLET | Freq: Every day | ORAL | Status: DC

## 2015-06-21 NOTE — Patient Instructions (Signed)
Stop taking potassium  Switch back to Plavix after your current Effient runs out  I will see you in about 6 weeks.

## 2015-06-21 NOTE — Progress Notes (Signed)
Kerry Mason Date of Birth: Oct 08, 1956 Medical Record #[id number]  History of Present Illness: Mr. Messmer is seen for follow up post hospitalization.  He has known CAD with remote stenting of the RCA in 2000 and then lost to follow up. Had anterior STEMI in January of 2015 - with cath showing occlusion of the proximal RCA with collaterals and occlusion of the proximal LAD. RCA occlusion felt to be chronic. EF 35% by cath and 30% by echo. Proximal and mid to distal LAD were stented with DES.  He  had a subsequent ICD implanted. Other issues include tobacco abuse- now stopped, gout, HLD, and HTN. In December 2105 he reported some increased SOB and chest pain. Myoview study was high risk with large area of anterior, septal and apical scar with inferoapical ischemia. EF 29%. He underwent repeat cardiac cath that showed the stents in the LAD were widely patent. The first diagonal had an 80% stenosis. The RCA was occluded proximally with left to right collaterals. Due to his refractory anginal symptoms he underwent CTO PCI on 02/03/15. His procedure was successful with excellent angiographic result but he did have a complication of perforation of the mid RCA. This resulted in cardiac tamponade and required emergent pericardiocentesis with removal of about a liter of blood. The perforation was sealed with a Graftmaster covered stent in the mid RCA. The remainder of the RCA was stented with 4 additional DES stents. He continued to have some drainage from the pericardium which became serous without recurrent bleeding. He had pericardial pain and evidence of pericarditis by Ecg. The drainage tapered off and Echo showed resolution of the effusion. The drain was removed on day 4. He also developed atrial fibrillation with RVR and converted to NSR on amiodarone.  Recently he was admitted with a NSTEMI. He presented with pain between his shoulder blades that felt like a cool burn. He had nausea and sweating. Ecg  showed no acute change with old anterior infarct. Troponin peaked at 2.82. He underwent cardiac cath that showed occlusion of the mid RCA with left to right collaterals. He was treated medically with the addition of Imdur. Amiodarone was stopped. No AFib. On follow up today he reports he feels weak. He had 2 episodes of some discomfort between his shoulder blades but no other symptoms and did not use Ntg. Overall feels well but anxious.    Current Outpatient Prescriptions  Medication Sig Dispense Refill  . acetaminophen (TYLENOL) 325 MG tablet Take 2 tablets (650 mg total) by mouth every 4 (four) hours as needed for headache or mild pain.    Marland Kitchen allopurinol (ZYLOPRIM) 300 MG tablet TAKE ONE TABLET BY MOUTH ONCE DAILY 30 tablet 3  . aspirin EC 81 MG tablet Take 81 mg by mouth daily.    Marland Kitchen atorvastatin (LIPITOR) 40 MG tablet Take 1 tablet (40 mg total) by mouth daily at 6 PM. 30 tablet 11  . CALCIUM PO Take 1 tablet by mouth daily.    . carvedilol (COREG) 6.25 MG tablet Take 1 tablet (6.25 mg total) by mouth 2 (two) times daily. 60 tablet 11  . Fluocinonide 0.1 % CREA Apply 1 application topically 3 (three) times daily as needed (rash - on legs, hands and bottom).    . furosemide (LASIX) 40 MG tablet Take 1 tablet (40 mg total) by mouth daily. 30 tablet 11  . isosorbide mononitrate (IMDUR) 30 MG 24 hr tablet Take 1 tablet (30 mg total) by mouth daily. 30 tablet 11  .  lisinopril (PRINIVIL,ZESTRIL) 5 MG tablet Take 1 tablet (5 mg total) by mouth daily. 30 tablet 11  . Magnesium 400 MG TABS Take 400 mg by mouth daily.    . Multiple Vitamin (MULTIVITAMIN WITH MINERALS) TABS tablet Take 1 tablet by mouth daily.    Marland Kitchen NITROSTAT 0.4 MG SL tablet DISSOLVE ONE TABLET UNDER THE TONGUE EVERY 5 MINUTES AS NEEDED FOR CHEST PAIN. 25 tablet 0  . Omega-3 Fatty Acids (FISH OIL PO) Take 1 capsule by mouth daily.    Marland Kitchen spironolactone (ALDACTONE) 25 MG tablet Take 1 tablet (25 mg total) by mouth at bedtime. 90 tablet 3  .  clopidogrel (PLAVIX) 75 MG tablet Take 1 tablet (75 mg total) by mouth daily. 30 tablet 3   No current facility-administered medications for this visit.    No Known Allergies  Past Medical History  Diagnosis Date  . CAD (coronary artery disease)     a. 2000: s/p stent of RCA 2000 with BMS  b. 2015: STEMI s/p LHC with old occlusion of RCA and DESx2 to LAD  c. 02/04/18: complicated PCI on 3/2 for CTO of mid RCA with coronary perforation and cardiac tamponade requiring emergent pericardiocentesis     . Tobacco abuse   . Obesity   . HTN (hypertension)   . Hyperlipidemia   . Acute systolic CHF (congestive heart failure)   . Ischemic cardiomyopathy     a. 2D ECHO: EF 25-30%. Akinesis of the anteroseptal and  . AICD (automatic cardioverter/defibrillator) present   . Anxiety   . Cardiac tamponade     a. 02/03/15 2/2 coronary perforation during CTO procedure. Sealed with graftmaster coated stent.   . Gout     Past Surgical History  Procedure Laterality Date  . Cardiac catheterization    . Coronary angioplasty  2000  . Coronary angioplasty with stent placement  2015  . Left heart catheterization with coronary angiogram N/A 12/11/2013    Procedure: LEFT HEART CATHETERIZATION WITH CORONARY ANGIOGRAM;  Surgeon: Peter M Martinique, MD;  Location: [facility] CATH LAB;  Service: Cardiovascular;  Laterality: N/A;  . Percutaneous coronary stent intervention (pci-s)  12/11/2013    Procedure: PERCUTANEOUS CORONARY STENT INTERVENTION (PCI-S);  Surgeon: Peter M Martinique, MD;  Location: [facility] CATH LAB;  Service: Cardiovascular;;  prov LAD and mid LAD  . Implantable cardioverter defibrillator implant N/A 04/17/2014    Procedure: IMPLANTABLE CARDIOVERTER DEFIBRILLATOR IMPLANT;  Surgeon: Evans Lance, MD;  Location: [facility] CATH LAB;  Service: Cardiovascular;  Laterality: N/A;  . Left heart catheterization with coronary angiogram N/A 01/05/2015    Procedure: LEFT HEART CATHETERIZATION WITH CORONARY ANGIOGRAM;  Surgeon: Peter M Martinique,  MD;  Location: [facility] CATH LAB;  Service: Cardiovascular;  Laterality: N/A;  . Percutaneous coronary stent intervention (pci-s) N/A 02/03/2015    Procedure: PERCUTANEOUS CORONARY STENT INTERVENTION (PCI-S);  Surgeon: Jettie Booze, MD;  Location: [facility] CATH LAB;  Service: Cardiovascular;  Laterality: N/A;  . Cardiac catheterization N/A 06/14/2015    Procedure: Left Heart Cath and Coronary Angiography;  Surgeon: Lorretta Harp, MD;  Location: [facility] CV LAB;  Service: Cardiovascular;  Laterality: N/A;    History  Smoking status  . Former Smoker -- 1.00 packs/day for 35 years  . Types: Cigarettes  . Quit date: 12/11/2013  Smokeless tobacco  . Not on file    History  Alcohol Use No    Family History  Problem Relation Age of Onset  . CAD Father     PTCA  Review of Systems: The review of systems is per the HPI.  All other systems were reviewed and are negative.  Physical Exam: BP 98/74 mmHg  Pulse 86  Ht 6\' 1"  (1.854 m)  Wt 90.266 kg (199 lb)  BMI 26.26 kg/m2 Patient is very pleasant and in no acute distress. Skin is warm and dry. Color is normal.  HEENT is unremarkable. Normocephalic/atraumatic. PERRL. Sclera are nonicteric. Neck is supple. No masses. No JVD. ICD site has healed well. Lungs are clear. Cardiac exam shows a regular rate and rhythm. normal S1-2. No gallop or murmur. Abdomen is soft. Extremities are without edema. Gait and ROM are intact. No gross neurologic deficits noted.  Wt Readings from Last 3 Encounters:  06/21/15 90.266 kg (199 lb)  06/16/15 89.7 kg (197 lb 12 oz)  03/15/15 91.717 kg (202 lb 3.2 oz)    LABORATORY DATA/PROCEDURES:  Lab Results  Component Value Date   WBC 10.1 06/14/2015   HGB 13.5 06/14/2015   HCT 40.3 06/14/2015   PLT 230 06/14/2015   GLUCOSE 94 06/15/2015   CHOL 91 06/14/2015   TRIG 77 06/14/2015   HDL 26* 06/14/2015   LDLCALC 50 06/14/2015   ALT 10 12/29/2014   AST 16 12/29/2014   NA 137 06/15/2015   K 3.9 06/15/2015    CL 107 06/15/2015   CREATININE 0.85 06/15/2015   BUN 12 06/15/2015   CO2 23 06/15/2015   TSH 1.265 12/11/2013   INR 1.13 06/13/2015   HGBA1C 5.8* 12/11/2013    BNP (last 3 results) No results for input(s): PROBNP in the last 8760 hours. Lab Results  Component Value Date   TROPONINI 2.82* 06/14/2015       Assessment / Plan: 1. CAD s/p anterior STEMI with extensive LAD stenting. Chronic occlusion of RCA. S/p successful CTO PCI of the RCA with multiple DES and Graftmaster stent for perforation in March. Now with NSTEMI related to reocclusion of RCA. I think this is predominantly related to the increased thrombogenicity of the Graftmaster stent. Will continue ASA and Plavix indefinitely. He was switched to Effient in hospital but I don't think there is an advantage over Plavix since we are treating medically. I discussed possible repeat PCI but the problem is that he would have a high risk of reocclusion even if we could restore flow. His cath report mentions a 50% stenosis in the distal left main but I reviewwed his films and I think this is overestimated. I would continue medical therapy. Resume exercise routine and we will see how symptomatic he is with more activity. I will follow up in 6 weeks.   2. S/p coronary perforation with hemopericardium and cardiac tamponade. S/p successful pericardiocentesis and drain. Resolved. Pericarditis due to irritation of blood. Resolved.   3. Paroxysmal Afib due to acute pericarditis. Resolved on amiodarone. No recurrence and now off amiodarone. We can use his ICD checks to monitor for Afib and if he continues to have more frequent episodes we will need to consider anticoagulation.   4. Chronic Systolic heart failure -EF 30%.  Will continue current Coreg, lisinopril, and aldactone Rx. Recommend he take lasix 20 mg daily. His doses of coreg and lisinopril were reduced in the hospital due to hypotension and we will continue current doses for now. Cannot  titrate further due to low BP. Was started on potassium in hospital but levels looked normal. Will stop potassium.  5. Underlying ICD - palpitations - continue beta blocker. No significant arrhythmia noted on ICD  interrogation   6. Hyperlipidemia. Well controlled on Pravachol.   7. Tobacco abuse- now quit.

## 2015-06-23 ENCOUNTER — Encounter: Payer: Medicaid Other | Admitting: Nurse Practitioner

## 2015-07-13 ENCOUNTER — Other Ambulatory Visit: Payer: Self-pay | Admitting: Nurse Practitioner

## 2015-07-15 ENCOUNTER — Other Ambulatory Visit: Payer: Self-pay | Admitting: Cardiology

## 2015-07-15 NOTE — Telephone Encounter (Signed)
REFILL 

## 2015-07-23 ENCOUNTER — Telehealth: Payer: Self-pay

## 2015-07-23 NOTE — Telephone Encounter (Signed)
Received a call from patient he stated he needed a note to return to work 07/28/15,needs note to say he can drive a car.Advised Dr.Jordan out of office, will have him sign on Monday 8/22 and I will leave at front desk of [facility] office to pick up.

## 2015-08-04 ENCOUNTER — Ambulatory Visit (INDEPENDENT_AMBULATORY_CARE_PROVIDER_SITE_OTHER): Payer: Medicaid Other | Admitting: Cardiology

## 2015-08-04 ENCOUNTER — Other Ambulatory Visit: Payer: Self-pay | Admitting: Cardiology

## 2015-08-04 ENCOUNTER — Encounter: Payer: Self-pay | Admitting: Cardiology

## 2015-08-04 ENCOUNTER — Encounter: Payer: Self-pay | Admitting: *Deleted

## 2015-08-04 VITALS — BP 110/78 | HR 79 | Ht 73.0 in | Wt 202.6 lb

## 2015-08-04 DIAGNOSIS — I5022 Chronic systolic (congestive) heart failure: Secondary | ICD-10-CM | POA: Diagnosis not present

## 2015-08-04 DIAGNOSIS — Z9861 Coronary angioplasty status: Secondary | ICD-10-CM

## 2015-08-04 DIAGNOSIS — Z95810 Presence of automatic (implantable) cardiac defibrillator: Secondary | ICD-10-CM

## 2015-08-04 DIAGNOSIS — E785 Hyperlipidemia, unspecified: Secondary | ICD-10-CM

## 2015-08-04 DIAGNOSIS — I255 Ischemic cardiomyopathy: Secondary | ICD-10-CM | POA: Diagnosis not present

## 2015-08-04 DIAGNOSIS — I10 Essential (primary) hypertension: Secondary | ICD-10-CM | POA: Diagnosis not present

## 2015-08-04 DIAGNOSIS — I2510 Atherosclerotic heart disease of native coronary artery without angina pectoris: Secondary | ICD-10-CM

## 2015-08-04 NOTE — Patient Instructions (Signed)
We will arrange repeat cardiac cath and intervention on September 2014.

## 2015-08-04 NOTE — Progress Notes (Signed)
Kerry Mason Date of Birth: May 14, 1956 Medical Record #[id number]  History of Present Illness: Kerry Mason is seen for follow up CAD.  He has known CAD with remote stenting of the RCA in 2000 and then lost to follow up. Had anterior STEMI in January of 2015 - with cath showing occlusion of the proximal RCA with collaterals and occlusion of the proximal LAD. RCA occlusion felt to be chronic. EF 35% by cath and 30% by echo. Proximal and mid to distal LAD were stented with DES.  He  had a subsequent ICD implanted. Other issues include tobacco abuse- now stopped, gout, HLD, and HTN. In December 2105 he reported  increased SOB and chest pain. Myoview study was high risk with large area of anterior, septal and apical scar with inferoapical ischemia. EF 29%. He underwent repeat cardiac cath that showed the stents in the LAD were widely patent. The first diagonal had an 80% stenosis. The RCA was occluded proximally with left to right collaterals. Due to his refractory anginal symptoms he underwent CTO PCI on 02/03/15. His procedure was successful with excellent angiographic result but he did have a complication of perforation of the mid RCA. This resulted in cardiac tamponade and required emergent pericardiocentesis with removal of about a liter of blood. The perforation was sealed with a Graftmaster covered stent in the mid RCA. The remainder of the RCA was stented with 4 additional DES stents.  He had pericardial pain and evidence of pericarditis by Ecg.  Echo showed resolution of the effusion. The drain was removed on day 4. He also developed atrial fibrillation with RVR and converted to NSR on amiodarone. Amiodarone was later discontinued. He was admitted with a NSTEMI.  Ecg showed no acute change with old anterior infarct. Troponin peaked at 2.82. He underwent cardiac cath that showed occlusion of the mid RCA with left to right collaterals. He was treated medically with the addition of Imdur.  On medical  therapy he continues to have some angina daily. Notes central chest pain with occasional pain radiating into his left shoulder and neck.  He complained of marked decrease in energy that has just improved over the past 2 weeks. Notes some dizziness when he stands up too quickly. He also complains of frequent skipping in his heart at night.    Current Outpatient Prescriptions  Medication Sig Dispense Refill  . acetaminophen (TYLENOL) 325 MG tablet Take 2 tablets (650 mg total) by mouth every 4 (four) hours as needed for headache or mild pain.    Marland Kitchen allopurinol (ZYLOPRIM) 300 MG tablet TAKE ONE TABLET BY MOUTH ONCE DAILY 30 tablet 3  . aspirin EC 81 MG tablet Take 81 mg by mouth daily.    Marland Kitchen atorvastatin (LIPITOR) 40 MG tablet Take 1 tablet (40 mg total) by mouth daily at 6 PM. 30 tablet 11  . CALCIUM PO Take 1 tablet by mouth daily.    . carvedilol (COREG) 6.25 MG tablet Take 1 tablet (6.25 mg total) by mouth 2 (two) times daily. 60 tablet 11  . clopidogrel (PLAVIX) 75 MG tablet Take 1 tablet (75 mg total) by mouth daily. 30 tablet 3  . Fluocinonide 0.1 % CREA Apply 1 application topically 3 (three) times daily as needed (rash - on legs, hands and bottom).    . furosemide (LASIX) 40 MG tablet Take 1 tablet (40 mg total) by mouth daily. 30 tablet 11  . isosorbide mononitrate (IMDUR) 30 MG 24 hr tablet Take 1 tablet (30 mg  total) by mouth daily. 30 tablet 11  . lisinopril (PRINIVIL,ZESTRIL) 5 MG tablet Take 1 tablet (5 mg total) by mouth daily. 30 tablet 11  . Magnesium 400 MG TABS Take 400 mg by mouth daily.    . Multiple Vitamin (MULTIVITAMIN WITH MINERALS) TABS tablet Take 1 tablet by mouth daily.    Marland Kitchen NITROSTAT 0.4 MG SL tablet DISSOLVE 1 TABLET UNDER THE TONGUE EVERY 5 MINUTES AS NEEDED FOR CHEST PAIN 25 tablet 8  . Omega-3 Fatty Acids (FISH OIL PO) Take 1 capsule by mouth daily.    Marland Kitchen spironolactone (ALDACTONE) 25 MG tablet TAKE ONE TABLET BY MOUTH AT BEDTIME 90 tablet 2   No current  facility-administered medications for this visit.    No Known Allergies  Past Medical History  Diagnosis Date  . CAD (coronary artery disease)     a. 2000: s/p stent of RCA 2000 with BMS  b. 2015: STEMI s/p LHC with old occlusion of RCA and DESx2 to LAD  c. 1/0/25: complicated PCI on 3/2 for CTO of mid RCA with coronary perforation and cardiac tamponade requiring emergent pericardiocentesis     . Tobacco abuse   . Obesity   . HTN (hypertension)   . Hyperlipidemia   . Acute systolic CHF (congestive heart failure)   . Ischemic cardiomyopathy     a. 2D ECHO: EF 25-30%. Akinesis of the anteroseptal and  . AICD (automatic cardioverter/defibrillator) present   . Anxiety   . Cardiac tamponade     a. 02/03/15 2/2 coronary perforation during CTO procedure. Sealed with graftmaster coated stent.   . Gout     Past Surgical History  Procedure Laterality Date  . Cardiac catheterization    . Coronary angioplasty  2000  . Coronary angioplasty with stent placement  2015  . Left heart catheterization with coronary angiogram N/A 12/11/2013    Procedure: LEFT HEART CATHETERIZATION WITH CORONARY ANGIOGRAM;  Surgeon: Peter M Martinique, MD;  Location: [facility] CATH LAB;  Service: Cardiovascular;  Laterality: N/A;  . Percutaneous coronary stent intervention (pci-s)  12/11/2013    Procedure: PERCUTANEOUS CORONARY STENT INTERVENTION (PCI-S);  Surgeon: Peter M Martinique, MD;  Location: [facility] CATH LAB;  Service: Cardiovascular;;  prov LAD and mid LAD  . Implantable cardioverter defibrillator implant N/A 04/17/2014    Procedure: IMPLANTABLE CARDIOVERTER DEFIBRILLATOR IMPLANT;  Surgeon: Evans Lance, MD;  Location: [facility] CATH LAB;  Service: Cardiovascular;  Laterality: N/A;  . Left heart catheterization with coronary angiogram N/A 01/05/2015    Procedure: LEFT HEART CATHETERIZATION WITH CORONARY ANGIOGRAM;  Surgeon: Peter M Martinique, MD;  Location: [facility] CATH LAB;  Service: Cardiovascular;  Laterality: N/A;  . Percutaneous coronary stent  intervention (pci-s) N/A 02/03/2015    Procedure: PERCUTANEOUS CORONARY STENT INTERVENTION (PCI-S);  Surgeon: Jettie Booze, MD;  Location: [facility] CATH LAB;  Service: Cardiovascular;  Laterality: N/A;  . Cardiac catheterization N/A 06/14/2015    Procedure: Left Heart Cath and Coronary Angiography;  Surgeon: Lorretta Harp, MD;  Location: [place] CV LAB;  Service: Cardiovascular;  Laterality: N/A;    History  Smoking status  . Former Smoker -- 1.00 packs/day for 35 years  . Types: Cigarettes  . Quit date: 12/11/2013  Smokeless tobacco  . Not on file    History  Alcohol Use No    Family History  Problem Relation Age of Onset  . CAD Father     PTCA    Review of Systems: The review of systems is per the HPI.  All other systems were reviewed and are negative.  Physical Exam: BP 110/78 mmHg  Pulse 79  Ht 6\' 1"  (1.854 m)  Wt 91.899 kg (202 lb 9.6 oz)  BMI 26.74 kg/m2 Patient is very pleasant and in no acute distress. Skin is warm and dry. Color is normal.  HEENT is unremarkable. Normocephalic/atraumatic. PERRL. Sclera are nonicteric. Neck is supple. No masses. No JVD. ICD site has healed well. Lungs are clear. Cardiac exam shows a regular rate and rhythm. normal S1-2. No gallop or murmur. Abdomen is soft. Extremities are without edema. Gait and ROM are intact. No gross neurologic deficits noted.  Wt Readings from Last 3 Encounters:  08/04/15 91.899 kg (202 lb 9.6 oz)  06/21/15 90.266 kg (199 lb)  06/16/15 89.7 kg (197 lb 12 oz)    LABORATORY DATA/PROCEDURES:  Lab Results  Component Value Date   WBC 10.1 06/14/2015   HGB 13.5 06/14/2015   HCT 40.3 06/14/2015   PLT 230 06/14/2015   GLUCOSE 94 06/15/2015   CHOL 91 06/14/2015   TRIG 77 06/14/2015   HDL 26* 06/14/2015   LDLCALC 50 06/14/2015   ALT 10 12/29/2014   AST 16 12/29/2014   NA 137 06/15/2015   K 3.9 06/15/2015   CL 107 06/15/2015   CREATININE 0.85 06/15/2015   BUN 12 06/15/2015   CO2 23 06/15/2015    TSH 1.265 12/11/2013   INR 1.13 06/13/2015   HGBA1C 5.8* 12/11/2013    BNP (last 3 results) No results for input(s): PROBNP in the last 8760 hours. Lab Results  Component Value Date   TROPONINI 2.82* 06/14/2015    Ecg today shows NSR with occ. PVC. Rate 79. Old anteroseptal infarct. Inferolateral T wave abnormality consistent with ischemia. I have personally reviewed and interpreted this study.    Assessment / Plan: 1. CAD s/p anterior STEMI with extensive LAD stenting. Chronic occlusion of RCA. S/p successful CTO PCI of the RCA with multiple DES and Graftmaster stent for perforation in March. NSTEMI in July related to reocclusion of RCA. I think this is predominantly related to the increased thrombogenicity of the Graftmaster stent. Patient remains symptomatic despite optimal medical therapy. He states he felt great after initial opening of the RCA. He would very much like to repeat attempt at opening the RCA. We discussed this at length. From a technical standpoint I think it should not be as difficult an intervention since the artery is extensively scaffolded with stents. The risk of perforation is lower. Perhaps with IVUS we can identify what led to reocclusion. There was also some disease noted in the distal left main. This did not appear to be flow limiting but perhaps FFR of this would be useful. If he had impaired flow of the left main then CABG may be a more appropriate revascularization option. If normal then will repeat attempt at PCI of the RCA.   2. S/p coronary perforation with hemopericardium and cardiac tamponade during CTO PCI. S/p successful pericardiocentesis and drain. Resolved. Pericarditis due to irritation of blood. Resolved.   3. Paroxysmal Afib due to acute pericarditis. Resolved on amiodarone. No recurrence and now off amiodarone.  4. Chronic Systolic heart failure -EF 30%.  Will continue current Coreg, lisinopril, nitrates,and aldactone Rx. Recommend he take lasix 20  mg daily. His doses of coreg and lisinopril were reduced in the hospital due to hypotension and we will continue current doses for now. Cannot titrate further due to low BP and orthostatic symptoms.   5. Underlying  ICD - palpitations - continue beta blocker. No significant arrhythmia noted on ICD interrogation   6. Hyperlipidemia. Well controlled on Pravachol.   7. Tobacco abuse- now quit.

## 2015-08-12 LAB — BASIC METABOLIC PANEL
BUN: 15 mg/dL (ref 7–25)
CO2: 27 mmol/L (ref 20–31)
Calcium: 9.2 mg/dL (ref 8.6–10.3)
Chloride: 101 mmol/L (ref 98–110)
Creat: 0.85 mg/dL (ref 0.70–1.33)
GLUCOSE: 90 mg/dL (ref 65–99)
Potassium: 4.7 mmol/L (ref 3.5–5.3)
Sodium: 137 mmol/L (ref 135–146)

## 2015-08-12 LAB — CBC WITH DIFFERENTIAL/PLATELET
BASOS PCT: 1 % (ref 0–1)
Basophils Absolute: 0.1 10*3/uL (ref 0.0–0.1)
EOS PCT: 4 % (ref 0–5)
Eosinophils Absolute: 0.5 10*3/uL (ref 0.0–0.7)
HEMATOCRIT: 43.3 % (ref 39.0–52.0)
Hemoglobin: 14.7 g/dL (ref 13.0–17.0)
Lymphocytes Relative: 34 % (ref 12–46)
Lymphs Abs: 3.9 10*3/uL (ref 0.7–4.0)
MCH: 29.6 pg (ref 26.0–34.0)
MCHC: 33.9 g/dL (ref 30.0–36.0)
MCV: 87.3 fL (ref 78.0–100.0)
MONO ABS: 0.6 10*3/uL (ref 0.1–1.0)
MPV: 8.2 fL — ABNORMAL LOW (ref 8.6–12.4)
Monocytes Relative: 5 % (ref 3–12)
Neutro Abs: 6.5 10*3/uL (ref 1.7–7.7)
Neutrophils Relative %: 56 % (ref 43–77)
Platelets: 290 10*3/uL (ref 150–400)
RBC: 4.96 MIL/uL (ref 4.22–5.81)
RDW: 17 % — AB (ref 11.5–15.5)
WBC: 11.6 10*3/uL — AB (ref 4.0–10.5)

## 2015-08-12 LAB — PROTIME-INR
INR: 1.02 (ref <1.50)
PROTHROMBIN TIME: 13.5 s (ref 11.6–15.2)

## 2015-08-18 ENCOUNTER — Encounter (HOSPITAL_COMMUNITY)
Admission: RE | Disposition: A | Discharge status: Home / Self Care | Payer: Medicaid Other | Source: Ambulatory Visit | Attending: Cardiology

## 2015-08-18 ENCOUNTER — Ambulatory Visit (HOSPITAL_COMMUNITY)
Admission: RE | Admit: 2015-08-18 | Discharge: 2015-08-18 | Disposition: A | Discharge status: Home / Self Care | Payer: Medicaid Other | Source: Ambulatory Visit | Attending: Cardiology | Admitting: Cardiology

## 2015-08-18 DIAGNOSIS — I309 Acute pericarditis, unspecified: Secondary | ICD-10-CM | POA: Insufficient documentation

## 2015-08-18 DIAGNOSIS — Z95810 Presence of automatic (implantable) cardiac defibrillator: Secondary | ICD-10-CM | POA: Diagnosis not present

## 2015-08-18 DIAGNOSIS — E785 Hyperlipidemia, unspecified: Secondary | ICD-10-CM | POA: Insufficient documentation

## 2015-08-18 DIAGNOSIS — I2582 Chronic total occlusion of coronary artery: Secondary | ICD-10-CM | POA: Insufficient documentation

## 2015-08-18 DIAGNOSIS — Z87891 Personal history of nicotine dependence: Secondary | ICD-10-CM | POA: Insufficient documentation

## 2015-08-18 DIAGNOSIS — I2510 Atherosclerotic heart disease of native coronary artery without angina pectoris: Secondary | ICD-10-CM | POA: Diagnosis present

## 2015-08-18 DIAGNOSIS — I5022 Chronic systolic (congestive) heart failure: Secondary | ICD-10-CM | POA: Diagnosis not present

## 2015-08-18 DIAGNOSIS — I10 Essential (primary) hypertension: Secondary | ICD-10-CM | POA: Insufficient documentation

## 2015-08-18 DIAGNOSIS — Z9861 Coronary angioplasty status: Secondary | ICD-10-CM

## 2015-08-18 DIAGNOSIS — I25119 Atherosclerotic heart disease of native coronary artery with unspecified angina pectoris: Secondary | ICD-10-CM | POA: Diagnosis not present

## 2015-08-18 DIAGNOSIS — I480 Paroxysmal atrial fibrillation: Secondary | ICD-10-CM | POA: Insufficient documentation

## 2015-08-18 DIAGNOSIS — Z7982 Long term (current) use of aspirin: Secondary | ICD-10-CM | POA: Diagnosis not present

## 2015-08-18 DIAGNOSIS — Z955 Presence of coronary angioplasty implant and graft: Secondary | ICD-10-CM | POA: Diagnosis not present

## 2015-08-18 DIAGNOSIS — I252 Old myocardial infarction: Secondary | ICD-10-CM | POA: Diagnosis not present

## 2015-08-18 DIAGNOSIS — R079 Chest pain, unspecified: Secondary | ICD-10-CM | POA: Diagnosis present

## 2015-08-18 DIAGNOSIS — Z79899 Other long term (current) drug therapy: Secondary | ICD-10-CM | POA: Diagnosis not present

## 2015-08-18 DIAGNOSIS — Z7902 Long term (current) use of antithrombotics/antiplatelets: Secondary | ICD-10-CM | POA: Insufficient documentation

## 2015-08-18 HISTORY — PX: CARDIAC CATHETERIZATION: SHX172

## 2015-08-18 LAB — POCT ACTIVATED CLOTTING TIME: ACTIVATED CLOTTING TIME: 343 s

## 2015-08-18 SURGERY — INTRAVASCULAR PRESSURE WIRE/FFR STUDY

## 2015-08-18 MED ORDER — SODIUM CHLORIDE 0.9 % IV SOLN: 250.0000 mL | INTRAVENOUS | Status: DC | PRN

## 2015-08-18 MED ORDER — FENTANYL CITRATE (PF) 100 MCG/2ML IJ SOLN
INTRAMUSCULAR | Status: DC | PRN
  Administered 2015-08-18: 25 ug via INTRAVENOUS

## 2015-08-18 MED ORDER — MIDAZOLAM HCL 2 MG/2ML IJ SOLN
INTRAMUSCULAR | Status: DC | PRN
  Administered 2015-08-18: 2 mg via INTRAVENOUS

## 2015-08-18 MED ORDER — MIDAZOLAM HCL 2 MG/2ML IJ SOLN
INTRAMUSCULAR | Status: AC
  Filled 2015-08-18: qty 4

## 2015-08-18 MED ORDER — SODIUM CHLORIDE 0.9 % WEIGHT BASED INFUSION: 3.0000 mL/kg/h | INTRAVENOUS | Status: AC

## 2015-08-18 MED ORDER — SODIUM CHLORIDE 0.9 % IJ SOLN: 3.0000 mL | INTRAMUSCULAR | Status: DC | PRN

## 2015-08-18 MED ORDER — SODIUM CHLORIDE 0.9 % IV SOLN
INTRAVENOUS | Status: DC
  Administered 2015-08-18: 1000 mL via INTRAVENOUS
  Administered 2015-08-18: 250 mL via INTRAVENOUS

## 2015-08-18 MED ORDER — HEPARIN (PORCINE) IN NACL 2-0.9 UNIT/ML-% IJ SOLN
INTRAMUSCULAR | Status: AC
  Filled 2015-08-18: qty 1000

## 2015-08-18 MED ORDER — TICAGRELOR 90 MG PO TABS
ORAL_TABLET | ORAL | Status: DC | PRN
  Administered 2015-08-18: 180 mg via ORAL

## 2015-08-18 MED ORDER — OXYCODONE-ACETAMINOPHEN 5-325 MG PO TABS
2.0000 | ORAL_TABLET | Freq: Once | ORAL | Status: AC
  Administered 2015-08-18: 2 via ORAL

## 2015-08-18 MED ORDER — FENTANYL CITRATE (PF) 100 MCG/2ML IJ SOLN
INTRAMUSCULAR | Status: AC
  Filled 2015-08-18: qty 4

## 2015-08-18 MED ORDER — LIDOCAINE HCL (PF) 1 % IJ SOLN
INTRAMUSCULAR | Status: AC
  Filled 2015-08-18: qty 30

## 2015-08-18 MED ORDER — IOHEXOL 350 MG/ML SOLN
INTRAVENOUS | Status: DC | PRN
  Administered 2015-08-18: 60 mL via INTRA_ARTERIAL

## 2015-08-18 MED ORDER — ADENOSINE (DIAGNOSTIC) 140MCG/KG/MIN
INTRAVENOUS | Status: DC | PRN
  Administered 2015-08-18: 140 ug/kg/min via INTRAVENOUS

## 2015-08-18 MED ORDER — LIDOCAINE HCL (PF) 1 % IJ SOLN
INTRAMUSCULAR | Status: DC | PRN
  Administered 2015-08-18: 14:00:00

## 2015-08-18 MED ORDER — ADENOSINE 12 MG/4ML IV SOLN
16.0000 mL | Freq: Once | INTRAVENOUS | Status: DC
  Filled 2015-08-18: qty 16

## 2015-08-18 MED ORDER — SODIUM CHLORIDE 0.9 % IJ SOLN: 3.0000 mL | Freq: Two times a day (BID) | INTRAMUSCULAR | Status: DC

## 2015-08-18 MED ORDER — HEPARIN SODIUM (PORCINE) 1000 UNIT/ML IJ SOLN
INTRAMUSCULAR | Status: DC | PRN
  Administered 2015-08-18: 7000 [IU] via INTRAVENOUS

## 2015-08-18 MED ORDER — NITROGLYCERIN 1 MG/10 ML FOR IR/CATH LAB
INTRA_ARTERIAL | Status: AC
  Filled 2015-08-18: qty 10

## 2015-08-18 MED ORDER — OXYCODONE-ACETAMINOPHEN 5-325 MG PO TABS
ORAL_TABLET | ORAL | Status: AC
  Administered 2015-08-18: 2 via ORAL
  Filled 2015-08-18: qty 2

## 2015-08-18 SURGICAL SUPPLY — 10 items
CATH VISTA GUIDE 6FR XBLAD4 (CATHETERS) ×2 IMPLANT
DEVICE WIRE ANGIOSEAL 6FR (Vascular Products) ×2 IMPLANT
GUIDEWIRE PRESSURE COMET II (WIRE) ×2 IMPLANT
KIT ENCORE 26 ADVANTAGE (KITS) ×3 IMPLANT
KIT HEART LEFT (KITS) ×3 IMPLANT
PACK CARDIAC CATHETERIZATION (CUSTOM PROCEDURE TRAY) ×3 IMPLANT
SHEATH PINNACLE 6F 10CM (SHEATH) ×2 IMPLANT
TRANSDUCER W/STOPCOCK (MISCELLANEOUS) ×3 IMPLANT
TUBING CIL FLEX 10 FLL-RA (TUBING) ×3 IMPLANT
WIRE EMERALD 3MM-J .035X150CM (WIRE) ×2 IMPLANT

## 2015-08-18 NOTE — Discharge Instructions (Signed)

## 2015-08-18 NOTE — Interval H&P Note (Signed)
History and Physical Interval Note:  08/18/2015 1:05 PM  Kerry Mason  has presented today for surgery, with the diagnosis of cad  The various methods of treatment have been discussed with the patient and family. After consideration of risks, benefits and other options for treatment, the patient has consented to  Procedure(s): Coronary/Bypass Graft CTO Intervention (N/A) as a surgical intervention .  The patient's history has been reviewed, patient examined, no change in status, stable for surgery.  I have reviewed the patient's chart and labs.  Questions were answered to the patient's satisfaction.   Cath Lab Visit (complete for each Cath Lab visit)  Clinical Evaluation Leading to the Procedure:   ACS: No.  Non-ACS:    Anginal Classification: CCS III  Anti-ischemic medical therapy: Maximal Therapy (2 or more classes of medications)  Non-Invasive Test Results: No non-invasive testing performed  Prior CABG: No previous CABG        [name] [facility] 08/18/2015 1:05 PM

## 2015-08-18 NOTE — H&P (View-Only) (Signed)
Kerry Mason Date of Birth: [birth year] Medical Record #[id number]  History of Present Illness: Kerry Mason is seen for follow up CAD.  He has known CAD with remote stenting of the RCA in 2000 and then lost to follow up. Had anterior STEMI in January of 2015 - with cath showing occlusion of the proximal RCA with collaterals and occlusion of the proximal LAD. RCA occlusion felt to be chronic. EF 35% by cath and 30% by echo. Proximal and mid to distal LAD were stented with DES.  He  had a subsequent ICD implanted. Other issues include tobacco abuse- now stopped, gout, HLD, and HTN. In December 2105 he reported  increased SOB and chest pain. Myoview study was high risk with large area of anterior, septal and apical scar with inferoapical ischemia. EF 29%. He underwent repeat cardiac cath that showed the stents in the LAD were widely patent. The first diagonal had an 80% stenosis. The RCA was occluded proximally with left to right collaterals. Due to his refractory anginal symptoms he underwent CTO PCI on 02/03/15. His procedure was successful with excellent angiographic result but he did have a complication of perforation of the mid RCA. This resulted in cardiac tamponade and required emergent pericardiocentesis with removal of about a liter of blood. The perforation was sealed with a Graftmaster covered stent in the mid RCA. The remainder of the RCA was stented with 4 additional DES stents.  He had pericardial pain and evidence of pericarditis by Ecg.  Echo showed resolution of the effusion. The drain was removed on day 4. He also developed atrial fibrillation with RVR and converted to NSR on amiodarone. Amiodarone was later discontinued. He was admitted with a NSTEMI.  Ecg showed no acute change with old anterior infarct. Troponin peaked at 2.82. He underwent cardiac cath that showed occlusion of the mid RCA with left to right collaterals. He was treated medically with the addition of Imdur.  On medical  therapy he continues to have some angina daily. Notes central chest pain with occasional pain radiating into his left shoulder and neck.  He complained of marked decrease in energy that has just improved over the past 2 weeks. Notes some dizziness when he stands up too quickly. He also complains of frequent skipping in his heart at night.    Current Outpatient Prescriptions  Medication Sig Dispense Refill  . acetaminophen (TYLENOL) 325 MG tablet Take 2 tablets (650 mg total) by mouth every 4 (four) hours as needed for headache or mild pain.    Marland Kitchen allopurinol (ZYLOPRIM) 300 MG tablet TAKE ONE TABLET BY MOUTH ONCE DAILY 30 tablet 3  . aspirin EC 81 MG tablet Take 81 mg by mouth daily.    Marland Kitchen atorvastatin (LIPITOR) 40 MG tablet Take 1 tablet (40 mg total) by mouth daily at 6 PM. 30 tablet 11  . CALCIUM PO Take 1 tablet by mouth daily.    . carvedilol (COREG) 6.25 MG tablet Take 1 tablet (6.25 mg total) by mouth 2 (two) times daily. 60 tablet 11  . clopidogrel (PLAVIX) 75 MG tablet Take 1 tablet (75 mg total) by mouth daily. 30 tablet 3  . Fluocinonide 0.1 % CREA Apply 1 application topically 3 (three) times daily as needed (rash - on legs, hands and bottom).    . furosemide (LASIX) 40 MG tablet Take 1 tablet (40 mg total) by mouth daily. 30 tablet 11  . isosorbide mononitrate (IMDUR) 30 MG 24 hr tablet Take 1 tablet (30 mg  total) by mouth daily. 30 tablet 11  . lisinopril (PRINIVIL,ZESTRIL) 5 MG tablet Take 1 tablet (5 mg total) by mouth daily. 30 tablet 11  . Magnesium 400 MG TABS Take 400 mg by mouth daily.    . Multiple Vitamin (MULTIVITAMIN WITH MINERALS) TABS tablet Take 1 tablet by mouth daily.    Marland Kitchen NITROSTAT 0.4 MG SL tablet DISSOLVE 1 TABLET UNDER THE TONGUE EVERY 5 MINUTES AS NEEDED FOR CHEST PAIN 25 tablet 8  . Omega-3 Fatty Acids (FISH OIL PO) Take 1 capsule by mouth daily.    Marland Kitchen spironolactone (ALDACTONE) 25 MG tablet TAKE ONE TABLET BY MOUTH AT BEDTIME 90 tablet 2   No current  facility-administered medications for this visit.    No Known Allergies  Past Medical History  Diagnosis Date  . CAD (coronary artery disease)     a. 2000: s/p stent of RCA 2000 with BMS  b. 2015: STEMI s/p LHC with old occlusion of RCA and DESx2 to LAD  c. 07/08/26: complicated PCI on 3/2 for CTO of mid RCA with coronary perforation and cardiac tamponade requiring emergent pericardiocentesis     . Tobacco abuse   . Obesity   . HTN (hypertension)   . Hyperlipidemia   . Acute systolic CHF (congestive heart failure)   . Ischemic cardiomyopathy     a. 2D ECHO: EF 25-30%. Akinesis of the anteroseptal and  . AICD (automatic cardioverter/defibrillator) present   . Anxiety   . Cardiac tamponade     a. 02/03/15 2/2 coronary perforation during CTO procedure. Sealed with graftmaster coated stent.   . Gout     Past Surgical History  Procedure Laterality Date  . Cardiac catheterization    . Coronary angioplasty  2000  . Coronary angioplasty with stent placement  2015  . Left heart catheterization with coronary angiogram N/A 12/11/2013    Procedure: LEFT HEART CATHETERIZATION WITH CORONARY ANGIOGRAM;  Surgeon: Peter M Martinique, MD;  Location: [facility] CATH LAB;  Service: Cardiovascular;  Laterality: N/A;  . Percutaneous coronary stent intervention (pci-s)  12/11/2013    Procedure: PERCUTANEOUS CORONARY STENT INTERVENTION (PCI-S);  Surgeon: Peter M Martinique, MD;  Location: [facility] CATH LAB;  Service: Cardiovascular;;  prov LAD and mid LAD  . Implantable cardioverter defibrillator implant N/A 04/17/2014    Procedure: IMPLANTABLE CARDIOVERTER DEFIBRILLATOR IMPLANT;  Surgeon: Evans Lance, MD;  Location: [facility] CATH LAB;  Service: Cardiovascular;  Laterality: N/A;  . Left heart catheterization with coronary angiogram N/A 01/05/2015    Procedure: LEFT HEART CATHETERIZATION WITH CORONARY ANGIOGRAM;  Surgeon: Peter M Martinique, MD;  Location: [facility] CATH LAB;  Service: Cardiovascular;  Laterality: N/A;  . Percutaneous coronary stent  intervention (pci-s) N/A 02/03/2015    Procedure: PERCUTANEOUS CORONARY STENT INTERVENTION (PCI-S);  Surgeon: Jettie Booze, MD;  Location: [facility] CATH LAB;  Service: Cardiovascular;  Laterality: N/A;  . Cardiac catheterization N/A 06/14/2015    Procedure: Left Heart Cath and Coronary Angiography;  Surgeon: Lorretta Harp, MD;  Location: [place] CV LAB;  Service: Cardiovascular;  Laterality: N/A;    History  Smoking status  . Former Smoker -- 1.00 packs/day for 35 years  . Types: Cigarettes  . Quit date: 12/11/2013  Smokeless tobacco  . Not on file    History  Alcohol Use No    Family History  Problem Relation Age of Onset  . CAD Father     PTCA    Review of Systems: The review of systems is per the HPI.  All other systems were reviewed and are negative.  Physical Exam: BP 110/78 mmHg  Pulse 79  Ht 6\' 1"  (1.854 m)  Wt 91.899 kg (202 lb 9.6 oz)  BMI 26.74 kg/m2 Patient is very pleasant and in no acute distress. Skin is warm and dry. Color is normal.  HEENT is unremarkable. Normocephalic/atraumatic. PERRL. Sclera are nonicteric. Neck is supple. No masses. No JVD. ICD site has healed well. Lungs are clear. Cardiac exam shows a regular rate and rhythm. normal S1-2. No gallop or murmur. Abdomen is soft. Extremities are without edema. Gait and ROM are intact. No gross neurologic deficits noted.  Wt Readings from Last 3 Encounters:  08/04/15 91.899 kg (202 lb 9.6 oz)  06/21/15 90.266 kg (199 lb)  06/16/15 89.7 kg (197 lb 12 oz)    LABORATORY DATA/PROCEDURES:  Lab Results  Component Value Date   WBC 10.1 06/14/2015   HGB 13.5 06/14/2015   HCT 40.3 06/14/2015   PLT 230 06/14/2015   GLUCOSE 94 06/15/2015   CHOL 91 06/14/2015   TRIG 77 06/14/2015   HDL 26* 06/14/2015   LDLCALC 50 06/14/2015   ALT 10 12/29/2014   AST 16 12/29/2014   NA 137 06/15/2015   K 3.9 06/15/2015   CL 107 06/15/2015   CREATININE 0.85 06/15/2015   BUN 12 06/15/2015   CO2 23 06/15/2015    TSH 1.265 12/11/2013   INR 1.13 06/13/2015   HGBA1C 5.8* 12/11/2013    BNP (last 3 results) No results for input(s): PROBNP in the last 8760 hours. Lab Results  Component Value Date   TROPONINI 2.82* 06/14/2015    Ecg today shows NSR with occ. PVC. Rate 79. Old anteroseptal infarct. Inferolateral T wave abnormality consistent with ischemia. I have personally reviewed and interpreted this study.    Assessment / Plan: 1. CAD s/p anterior STEMI with extensive LAD stenting. Chronic occlusion of RCA. S/p successful CTO PCI of the RCA with multiple DES and Graftmaster stent for perforation in March. NSTEMI in July related to reocclusion of RCA. I think this is predominantly related to the increased thrombogenicity of the Graftmaster stent. Patient remains symptomatic despite optimal medical therapy. He states he felt great after initial opening of the RCA. He would very much like to repeat attempt at opening the RCA. We discussed this at length. From a technical standpoint I think it should not be as difficult an intervention since the artery is extensively scaffolded with stents. The risk of perforation is lower. Perhaps with IVUS we can identify what led to reocclusion. There was also some disease noted in the distal left main. This did not appear to be flow limiting but perhaps FFR of this would be useful. If he had impaired flow of the left main then CABG may be a more appropriate revascularization option. If normal then will repeat attempt at PCI of the RCA.   2. S/p coronary perforation with hemopericardium and cardiac tamponade during CTO PCI. S/p successful pericardiocentesis and drain. Resolved. Pericarditis due to irritation of blood. Resolved.   3. Paroxysmal Afib due to acute pericarditis. Resolved on amiodarone. No recurrence and now off amiodarone.  4. Chronic Systolic heart failure -EF 30%.  Will continue current Coreg, lisinopril, nitrates,and aldactone Rx. Recommend he take lasix 20  mg daily. His doses of coreg and lisinopril were reduced in the hospital due to hypotension and we will continue current doses for now. Cannot titrate further due to low BP and orthostatic symptoms.   5. Underlying  ICD - palpitations - continue beta blocker. No significant arrhythmia noted on ICD interrogation   6. Hyperlipidemia. Well controlled on Pravachol.   7. Tobacco abuse- now quit.

## 2015-08-19 ENCOUNTER — Encounter (HOSPITAL_COMMUNITY): Payer: Self-pay | Admitting: Cardiology

## 2015-08-19 MED FILL — Nitroglycerin IV Soln 100 MCG/ML in D5W: INTRA_ARTERIAL | Qty: 10 | Status: AC

## 2015-08-23 ENCOUNTER — Encounter: Payer: Self-pay | Admitting: Thoracic Surgery (Cardiothoracic Vascular Surgery)

## 2015-08-23 ENCOUNTER — Other Ambulatory Visit: Payer: Self-pay | Admitting: *Deleted

## 2015-08-23 ENCOUNTER — Institutional Professional Consult (permissible substitution) (INDEPENDENT_AMBULATORY_CARE_PROVIDER_SITE_OTHER): Payer: Medicaid Other | Admitting: Thoracic Surgery (Cardiothoracic Vascular Surgery)

## 2015-08-23 VITALS — BP 114/74 | HR 70 | Resp 16 | Ht 73.0 in | Wt 197.0 lb

## 2015-08-23 DIAGNOSIS — I2510 Atherosclerotic heart disease of native coronary artery without angina pectoris: Secondary | ICD-10-CM | POA: Insufficient documentation

## 2015-08-23 DIAGNOSIS — I5022 Chronic systolic (congestive) heart failure: Secondary | ICD-10-CM

## 2015-08-23 DIAGNOSIS — Z9861 Coronary angioplasty status: Secondary | ICD-10-CM | POA: Diagnosis not present

## 2015-08-23 DIAGNOSIS — I509 Heart failure, unspecified: Secondary | ICD-10-CM

## 2015-08-23 NOTE — Progress Notes (Signed)
[address]       [place] [address]             [phone]     CARDIOTHORACIC SURGERY CONSULTATION REPORT  Referring Provider is Martinique, Peter M, MD PCP is Velna Hatchet, MD  Chief Complaint  Patient presents with  . Coronary Artery Disease    eval for CABG..    HPI:  Patient is a 59 year old male with long-standing history of coronary artery disease, ischemic cardiomyopathy with chronic systolic congestive heart failure, hypertension, hyperlipidemia, and previous history of tobacco abuse who has been referred for surgical consultation to discuss treatment options for management of ischemic heart disease.  The patient's cardiac history dates back to 2000 when he presented with angina pectoris. He underwent PCI and stenting of the right coronary artery at that time and apparently was subsequently lost to follow-up. In January 2015 he suffered a massive ST segment elevation myocardial infarction with occlusion of the proximal left anterior descending coronary artery. He was noted to have occlusion of the right coronary artery at that time that was felt to be chronic. He was treated with PCI and stenting of the left anterior descending coronary artery with drug-eluting stents. He subsequently underwent ICD implantation for primary prophylaxis because of ejection fraction estimated 30-35%. In December 2015 he underwent follow-up stress my view because of symptoms of exertional shortness of breath and chest pain. Diagnostic cardiac catheterization revealed widely patent stents in the left anterior descending coronary artery. There remained chronic occlusion of the right coronary artery.  He subsequently underwent an attempt at PCI of his chronic total occlusion of the right coronary artery on 02/03/2015. This was, goodbye perforation of mid right coronary artery with cardiac tamponade. He was treated with pericardiocentesis. Perforation of the coronary artery was sealed  using a covered stent in the mid right coronary artery. An additional 4 drug eluding stents were placed in the RCA at that time. He recovered without need for open surgical intervention.  In July the patient was hospitalized with an acute non-ST segment elevation myocardial infarction. Diagnostic cardiac catheterization performed at that time demonstrated chronic occlusion of the right coronary artery with 50% stenosis of the distal left main coronary artery. He was treated medically. The patient was recently seen in follow-up and continued to complain of fatigue, exertional shortness of breath, and intermittent chest discomfort. Repeat diagnostic cardiac catheterization was performed revealing similar findings with 50% stenosis of the distal left main coronary artery and chronic occlusion of right coronary artery. Fractional flow wire analysis was performed of the left main coronary stenosis, confirming the presence of hemodynamically significant disease. The patient was referred for surgical consultation.  The patient is single and lives with his [place] in [place]. He works for the [employer]. He has been functionally independent and physically active most of his life, although ever since his heart attack in 2015 he has experienced progressive symptoms of exertional fatigue and shortness of breath. He currently states that he gets short of breath with moderate physical activity. This does not seem to limit his day-to-day activities to any significant degree. He complains of chronic fatigue. He denies any history of resting shortness of breath, PND, orthopnea or lower extremity edema. He has atypical symptoms of pain across his chest that seemed to come and go sporadically and are not necessarily related to physical exertion. Pain is described as dull and sometimes located across the left side  of his chest, sometimes radiating to left shoulder.  Past Medical History  Diagnosis Date    . CAD (coronary artery disease)     a. 2000: s/p stent of RCA 2000 with BMS  b. 2015: STEMI s/p LHC with old occlusion of RCA and DESx2 to LAD  c. 02/04/18: complicated PCI on 3/2 for CTO of mid RCA with coronary perforation and cardiac tamponade requiring emergent pericardiocentesis     . Tobacco abuse   . Obesity   . HTN (hypertension)   . Hyperlipidemia   . Acute systolic CHF (congestive heart failure)   . Ischemic cardiomyopathy     a. 2D ECHO: EF 25-30%. Akinesis of the anteroseptal and  . AICD (automatic cardioverter/defibrillator) present   . Anxiety   . Cardiac tamponade     a. 02/03/15 2/2 coronary perforation during CTO procedure. Sealed with graftmaster coated stent.   . Gout   . Left main coronary artery disease     Past Surgical History  Procedure Laterality Date  . Cardiac catheterization    . Coronary angioplasty  2000  . Coronary angioplasty with stent placement  2015  . Left heart catheterization with coronary angiogram N/A 12/11/2013    Procedure: LEFT HEART CATHETERIZATION WITH CORONARY ANGIOGRAM;  Surgeon: Peter M Martinique, MD;  Location: [facility] CATH LAB;  Service: Cardiovascular;  Laterality: N/A;  . Percutaneous coronary stent intervention (pci-s)  12/11/2013    Procedure: PERCUTANEOUS CORONARY STENT INTERVENTION (PCI-S);  Surgeon: Peter M Martinique, MD;  Location: [facility] CATH LAB;  Service: Cardiovascular;;  prov LAD and mid LAD  . Implantable cardioverter defibrillator implant N/A 04/17/2014    Procedure: IMPLANTABLE CARDIOVERTER DEFIBRILLATOR IMPLANT;  Surgeon: Evans Lance, MD;  Location: [facility] CATH LAB;  Service: Cardiovascular;  Laterality: N/A;  . Left heart catheterization with coronary angiogram N/A 01/05/2015    Procedure: LEFT HEART CATHETERIZATION WITH CORONARY ANGIOGRAM;  Surgeon: Peter M Martinique, MD;  Location: [facility] CATH LAB;  Service: Cardiovascular;  Laterality: N/A;  . Percutaneous coronary stent intervention (pci-s) N/A 02/03/2015    Procedure: PERCUTANEOUS CORONARY STENT  INTERVENTION (PCI-S);  Surgeon: Jettie Booze, MD;  Location: [facility] CATH LAB;  Service: Cardiovascular;  Laterality: N/A;  . Cardiac catheterization N/A 06/14/2015    Procedure: Left Heart Cath and Coronary Angiography;  Surgeon: Lorretta Harp, MD;  Location: [facility] CV LAB;  Service: Cardiovascular;  Laterality: N/A;  . Cardiac catheterization N/A 08/18/2015    Procedure: Intravascular Pressure Wire/FFR Study;  Surgeon: Peter M Martinique, MD;  Location: [facility] CV LAB;  Service: Cardiovascular;  Laterality: N/A;    Family History  Problem Relation Age of Onset  . CAD Father     PTCA  . Cancer Mother     LYMPHOMA    Social History   Social History  . Marital Status: Married    Spouse Name: N/A  . Number of Children: 2  . 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 Smoking status: Former Smoker -- 1.00 packs/day for 35 years    Types: Cigarettes    Quit date: 12/11/2013  . Smokeless tobacco: Not on file  . Alcohol Use: No  . Drug Use: No  . Sexual Activity: Not Currently   Other Topics Concern  . Not on file   Social History Narrative    Current Outpatient Prescriptions  Medication Sig Dispense Refill  . acetaminophen (TYLENOL) 325 MG tablet Take  2 tablets (650 mg total) by mouth every 4 (four) hours as needed for headache or mild pain.    Marland Kitchen allopurinol (ZYLOPRIM) 300 MG tablet TAKE ONE TABLET BY MOUTH ONCE DAILY 30 tablet 3  . aspirin EC 81 MG tablet Take 81 mg by mouth daily.    Marland Kitchen atorvastatin (LIPITOR) 40 MG tablet Take 1 tablet (40 mg total) by mouth daily at 6 PM. 30 tablet 11  . CALCIUM PO Take 1 tablet by mouth daily.    . carvedilol (COREG) 6.25 MG tablet Take 1 tablet (6.25 mg total) by mouth 2 (two) times daily. 60 tablet 11  . clopidogrel (PLAVIX) 75 MG tablet Take 1 tablet (75 mg total) by mouth daily. 30 tablet 3  . Fluocinonide 0.1 % CREA Apply 1 application topically 3 (three) times  daily as needed (rash - on legs, hands and bottom).    . furosemide (LASIX) 40 MG tablet Take 1 tablet (40 mg total) by mouth daily. 30 tablet 11  . isosorbide mononitrate (IMDUR) 30 MG 24 hr tablet Take 1 tablet (30 mg total) by mouth daily. 30 tablet 11  . lisinopril (PRINIVIL,ZESTRIL) 5 MG tablet Take 1 tablet (5 mg total) by mouth daily. 30 tablet 11  . Magnesium 400 MG TABS Take 400 mg by mouth daily.    . Multiple Vitamin (MULTIVITAMIN WITH MINERALS) TABS tablet Take 1 tablet by mouth daily.    Marland Kitchen NITROSTAT 0.4 MG SL tablet DISSOLVE 1 TABLET UNDER THE TONGUE EVERY 5 MINUTES AS NEEDED FOR CHEST PAIN 25 tablet 8  . Omega-3 Fatty Acids (FISH OIL PO) Take 1 capsule by mouth daily.    Marland Kitchen spironolactone (ALDACTONE) 25 MG tablet TAKE ONE TABLET BY MOUTH AT BEDTIME 90 tablet 2   No current facility-administered medications for this visit.    No Known Allergies    Review of Systems:   General:  normal appetite, decrased energy, no weight gain, no weight loss, no fever  Cardiac:  + chest pain with exertion, + chest pain at rest, + SOB with exertion, no resting SOB, no PND, no orthopnea, + palpitations, no arrhythmia, no atrial fibrillation, no LE edema, + dizzy spells, no syncope  Respiratory:  + shortness of breath, no home oxygen, no productive cough, + dry cough, no bronchitis, no wheezing, no hemoptysis, no asthma, no pain with inspiration or cough, no sleep apnea, no CPAP at night  GI:   no difficulty swallowing, no reflux, no frequent heartburn, no hiatal hernia, no abdominal pain, no constipation, no diarrhea, no hematochezia, no hematemesis, no melena  GU:   no dysuria,  no frequency, no urinary tract infection, no hematuria, no enlarged prostate, no kidney stones, no kidney disease  Vascular:  no pain suggestive of claudication, no pain in feet, no leg cramps, no varicose veins, no DVT, no non-healing foot ulcer  Neuro:   no stroke, no TIA's, no seizures, no headaches, no temporary  blindness one eye,  no slurred speech, no peripheral neuropathy, no chronic pain, no instability of gait, no memory/cognitive dysfunction  Musculoskeletal: no arthritis, no joint swelling, no myalgias, no difficulty walking, normal mobility   Skin:   no rash, no itching, no skin infections, no pressure sores or ulcerations  Psych:   no anxiety, no depression, no nervousness, no unusual recent stress  Eyes:   + blurry vision, no floaters, no recent vision changes, no wears glasses or contacts  ENT:   + hearing loss, no loose or painful teeth,  no dentures, last saw dentist several years ago  Hematologic:  + easy bruising, no abnormal bleeding, no clotting disorder, no frequent epistaxis  Endocrine:  no diabetes, does not check CBG's at home     Physical Exam:   BP 114/74 mmHg  Pulse 70  Resp 16  Ht 6\' 1"  (1.854 m)  Wt 197 lb (89.359 kg)  BMI 26.00 kg/m2  SpO2 98%  General:    well-appearing  HEENT:  Unremarkable   Neck:   no JVD, no bruits, no adenopathy   Chest:   clear to auscultation, symmetrical breath sounds, no wheezes, no rhonchi   CV:   RRR, no  murmur   Abdomen:  soft, non-tender, no masses   Extremities:  warm, well-perfused, pulses palpable, no LE edema  Rectal/GU  Deferred  Neuro:   Grossly non-focal and symmetrical throughout  Skin:   Clean and dry, no rashes, no breakdown   Diagnostic Tests:  CARDIAC CATHETERIZATION  Procedures    Intravascular Pressure Wire/FFR Study    PACS Images    Show images for Cardiac catheterization     Link to Procedure Log    Procedure Log      Indications    Angina pectoris [I20.9 (ICD-10-CM)]    Technique and Indications    Indication: 59 yo WM with history of CAD. He is s/p acute anterior MI in Jan. 2015 treated with DES of LAD. He had CTO of the RCA. He was symptomatic and underwent CTO PCI of the RCA in March 0865 complicated by acute perforation treated with a covered stent. He did well until July 2016 when he  presented with NSTEMI and he had reocclusion of the RCA . He has continued to have significant angina despite optimal medical therapy. He was brought back to assess the borderline stenosis of the distal left main to help guide potential revascularization options.    Procedural details: The right groin was prepped, draped, and anesthetized with 1% lidocaine. Using modified Seldinger technique, a 6 French sheath was introduced into the right femoral artery. A 6 Fr. XBLAD 4 guide was used to engage the left main. Angiography demonstrated an eccentric 50% distal left main stenosis before trifurcating into the LAD, a large ramus branch, and the LCx. The LAD stent appeared widely patent. The patient was anticoagulated with IV heparin. After a therapeutic ACT was obtained we passed a Comet FFR wire in the LAD. Resting FFR was 0.86. With IV adenosine the FFR dropped to 0.76 indicating that the distal left main/ostial LAD lesion was hemodynamically significant. There were no immediate procedural complications. The patient was transferred to the post catheterization recovery area for further monitoring.Estimated blood loss <50 mL. There were no immediate complications during the procedure.    Conclusion     Mid RCA to Dist RCA lesion, 100% stenosed. The lesion was previously treated with a stent (unknown type) .  LM lesion, 50% stenosed.  Abnormal FFR of the distal left main of 0.76  Based on the above findings I would recommend CABG for complete revascularization. Will arrange outpatient CT surgery consultation. Once surgery date is set would discontinue Plavix 7 days prior.      Coronary Findings    Dominance: Right   Left Main   . LM lesion, 50% stenosed. Pressure wire/FFR was performed on the lesion. FFR: 0.76.     Left Anterior Descending   . Mid LAD to Dist LAD lesion, 0% stenosed. Previously placed Mid LAD to Dist LAD stent (  unknown type) is patent.     Right Coronary Artery  Dist RCA  filled by collaterals from Dist LAD.   Marland Kitchen Prox RCA to Mid RCA lesion, 0% stenosed. Previously placed Prox RCA to Mid RCA stent (unknown type) is patent.   . Mid RCA to Dist RCA lesion, 100% stenosed. The lesion was previously treated with a stent (unknown type) .      Coronary Diagrams    Diagnostic Diagram            Implants    Name ID Temporary Type Supply   ANGIOSEAL 6FR - [id number] [phone] No Vascular Products ANGIOSEAL 6FR    Hemo Data       Most Recent Value   AO Systolic Pressure  75 mmHg   AO Diastolic Pressure  52 mmHg   AO Mean  62 mmHg     Procedures    Left Heart Cath and Coronary Angiography    PACS Images    Show images for Cardiac catheterization     Link to Procedure Log    Procedure Log      Indications    Coronary artery disease due to lipid rich plaque [I25.10 (ICD-10-CM)]   Left ventricular dysfunction [I51.9 (ICD-10-CM)]   Non-STEMI (non-ST elevated myocardial infarction) [I21.4 (ICD-10-CM)]    Technique and Indications    There were no immediate complications during the procedure.    Conclusion     Mid RCA to Dist RCA lesion, 100% stenosed. The lesion was previously treated with a stent (unknown type) .  LM lesion, 50% stenosed.  Kerry Mason is a 59 y.o. male   [id number] LOCATION: FACILITY: [facility]  PHYSICIAN: Quay Burow, M.D. 06-Apr-1956   DATE OF PROCEDURE: 06/14/2015  DATE OF DISCHARGE:     CARDIAC CATHETERIZATION     History obtained from chart review. Kerry Mason is a 59 year old mildly overweight Caucasian male ischemic cardiomyopathy. He had stenting of his LAD in 2015. He has an EF in the 35% range status post ICD placement. Problems include hypertension and hyperlipidemia. He smoked remotely. He has lost 100 pounds last year as a result of bilateral exercise. He underwent a complex chronic total occlusion intervention by Dr. Martinique in March of this year complicated by perforation of  the occluded RCA graft Master covered stent. The entire RCA is stented. The patient had chest pain yesterday and was admitted with unstable angina. He had positive enzymes/non-STEMI. His EKG showed lateral T-wave inversion. He presents now for diagnostic coronary arteriography.     IMPRESSION:Kerry Mason has an occluded dominant right coronary artery in the midportion at the location of prior stenting. The covered stent was placed over the previously placed drug-eluting stent. He does have grade 2-3 left-to-right collaterals. After discussing this with Dr. Martinique, did not feel intervention placing a third layer stent would be appropriate at this time. He does have a 50% distal left main stenosis. We will continue medical therapy.  Quay Burow. MD, [facility] 06/14/2015 12:04 PM        Coronary Findings    Dominance: Right   Left Main   . LM lesion, 50% stenosed.     Left Anterior Descending   . Mid LAD to Dist LAD lesion, 0% stenosed. Previously placed Mid LAD to Dist LAD stent (unknown type) is patent.     Right Coronary Artery  Dist RCA filled by collaterals from Dist LAD.   Marland Kitchen Prox RCA to Mid RCA lesion, 0% stenosed. Previously placed Prox RCA to  Mid RCA stent (unknown type) is patent.   . Mid RCA to Dist RCA lesion, 100% stenosed. The lesion was previously treated with a stent (unknown type) .      Coronary Diagrams    Diagnostic Diagram            Implants    Name ID Temporary Type Supply   No information to display    Hemo Data       Most Recent Value   AO Systolic Pressure  419 mmHg   AO Diastolic Pressure  71 mmHg   AO Mean  90 mmHg      Transthoracic Echocardiography  Patient:  Kerry Mason, Kerry Mason MR #:    [id number] Study Date: 02/07/2015 Gender:   M Age:    82 Height:   188 cm Weight:   90.3 kg BSA:    2.18 m^2 Pt. Status: Room:    3E20C  ATTENDING  Peter Martinique, M.D. ADMITTING  Casandra Doffing, MD ORDERING    Loralie Champagne, M.D. REFERRING  Loralie Champagne, M.D. PERFORMING  [facility], Inpatient SONOGRAPHER Roseanna Rainbow  cc:  ------------------------------------------------------------------- LV EF: 25% -  30%  ------------------------------------------------------------------- Indications:   Pericardial effusion 423.9.  ------------------------------------------------------------------- History:  PMH:  Coronary artery disease. PMH:  Myocardial infarction. Risk factors: Hypertension. Dyslipidemia.  ------------------------------------------------------------------- Study Conclusions  - Left ventricle: The cavity size was normal. Wall thickness was increased in a pattern of moderate LVH. Systolic function was severely reduced. The estimated ejection fraction was in the range of 25% to 30%. There is akinesis of the anteroseptal and apical myocardium. - Aortic root: The aortic root was mildly dilated. - Mitral valve: There was mild regurgitation. - Left atrium: The atrium was mildly dilated. - Right ventricle: Systolic function was mildly reduced.  Impressions:  - Compared to 02/05/15, pericardial effusion now resolved.  Transthoracic echocardiography. M-mode, complete 2D, spectral Doppler, and color Doppler. Birthdate: Patient birthdate: 1955-12-15. Age: Patient is 59 yr old. Sex: Gender: male. BMI: 25.6 kg/m^2. Blood pressure:   107/68 Patient status: Inpatient. Study date: Study date: 02/07/2015. Study time: 11:19 AM. Location: ICU/CCU  -------------------------------------------------------------------  ------------------------------------------------------------------- Left ventricle: The cavity size was normal. Wall thickness was increased in a pattern of moderate LVH. Systolic function was severely reduced. The estimated ejection fraction was in the range of 25% to 30%. Regional wall motion abnormalities:  There is akinesis of the  anteroseptal and apical myocardium.  ------------------------------------------------------------------- Aortic valve:  Trileaflet; mildly calcified leaflets. Mobility was not restricted. Doppler: Transvalvular velocity was within the normal range. There was no stenosis. There was no regurgitation.  ------------------------------------------------------------------- Aorta: Aortic root: The aortic root was mildly dilated.  ------------------------------------------------------------------- Mitral valve:  Structurally normal valve.  Mobility was not restricted. Doppler: Transvalvular velocity was within the normal range. There was no evidence for stenosis. There was mild regurgitation.  ------------------------------------------------------------------- Left atrium: The atrium was mildly dilated.  ------------------------------------------------------------------- Right ventricle: The cavity size was normal. Pacer wire or catheter noted in right ventricle. Systolic function was mildly reduced.  ------------------------------------------------------------------- Pulmonic valve:  Doppler: Transvalvular velocity was within the normal range. There was no evidence for stenosis.  ------------------------------------------------------------------- Tricuspid valve:  Structurally normal valve.  Doppler: Transvalvular velocity was within the normal range. There was mild regurgitation.  ------------------------------------------------------------------- Pulmonary artery:  Systolic pressure was within the normal range.  ------------------------------------------------------------------- Right atrium: The atrium was normal in size. Pacer wire or catheter noted in right atrium.  ------------------------------------------------------------------- Pericardium: There was no pericardial  effusion.  ------------------------------------------------------------------- Systemic veins: Inferior vena  cava: The vessel was normal in size.  ------------------------------------------------------------------- Measurements  Left ventricle             Value    Reference LV ID, ED, PLAX chordal        45.3 mm   43 - 52 LV ID, ES, PLAX chordal    (H)   42.2 mm   23 - 38 LV fx shortening, PLAX chordal (L)   7   %   >=29 LV PW thickness, ED          14.7 mm   --------- IVS/LV PW ratio, ED          1.03     <=1.3 LV e&', lateral             7.72 cm/s  --------- LV e&', medial             8.37 cm/s  --------- LV e&', average             8.05 cm/s  ---------  Ventricular septum           Value    Reference IVS thickness, ED           15.1 mm   ---------  LVOT                  Value    Reference LVOT peak velocity, S         94.6 cm/s  --------- LVOT mean velocity, S         54.1 cm/s  --------- LVOT VTI, S              15.4 cm   --------- LVOT peak gradient, S         4   mm Hg ---------  Aorta                 Value    Reference Aortic root ID, ED           40  mm   ---------  Left atrium              Value    Reference LA ID, A-P, ES             49  mm   --------- LA ID/bsa, A-P         (H)   2.25 cm/m^2 <=2.2  Pulmonary arteries           Value    Reference PA pressure, S, DP           24  mm Hg <=30  Tricuspid valve            Value    Reference Tricuspid regurg peak velocity     229  cm/s  --------- Tricuspid peak RV-RA gradient     21  mm Hg ---------  Systemic veins             Value     Reference Estimated CVP             3   mm Hg ---------  Right ventricle            Value    Reference RV pressure, S, DP           24  mm Hg <=30  Legend: (L) and (H) mark values outside specified reference range.  ------------------------------------------------------------------- Prepared and Electronically Authenticated by  Kirk Ruths 2016-03-06T14:36:14    Impression:  Patient has  left main coronary artery disease and multivessel coronary artery disease with chronic occlusion of the right coronary artery. He presents with long-standing symptoms of exertional fatigue and shortness of breath consistent with chronic systolic congestive heart failure, [place] Heart Association functional class II. He also has intermittent symptoms of chest discomfort that are somewhat atypical in nature but potentially consistent with angina pectoris. I personally reviewed the patient's last several cardiac catheterizations. The patient has 50% stenosis of the left main coronary artery, and this has been confirmed to be hemodynamically significant using fractional flow wire analysis. There is chronic occlusion of the right coronary artery with left-to-right collateral filling of the terminal branches. The terminal branches of the distal right coronary system are quite small and may or may not be large enough for grafting. I agree the patient would best be treated with surgical revascularization. Risks associated with surgery and the technical aspects of bypass grafting may be affected by the patient's previous history of perforation of the right coronary artery with pericardial tamponade.   Plan:  I have reviewed the indications, risks, and potential benefits of coronary artery bypass grafting with the patient and his family.  Alternative treatment strategies have been discussed, including the relative risks, benefits and long term prognosis  associated with medical therapy, percutaneous coronary intervention, and surgical revascularization.  The patient understands and accepts all potential associated risks of surgery including but not limited to risk of death, stroke or other neurologic complication, myocardial infarction, congestive heart failure, respiratory failure, renal failure, bleeding requiring blood transfusion and/or reexploration, aortic dissection or other major vascular complication, arrhythmia, heart block or bradycardia requiring permanent pacemaker, pneumonia, pleural effusion, wound infection, pulmonary embolus or other thromboembolic complication, chronic pain or other delayed complications related to median sternotomy, or the late recurrence of symptomatic ischemic heart disease and/or congestive heart failure.  The importance of long term risk modification have been emphasized.  All questions answered.  We will obtain a follow-up transthoracic echocardiogram to reassess left ventricular function prior to surgery.  We tentatively plan to proceed with surgery on Thursday, 09/02/2015. The patient has been instructed to stop taking Plavix in anticipation of surgery.   I spent in excess of 90 minutes during the conduct of this office consultation and >50% of this time involved direct face-to-face encounter with the patient for counseling and/or coordination of their care.    Valentina Gu. Roxy Manns, MD 08/23/2015 12:48 PM

## 2015-08-23 NOTE — Patient Instructions (Signed)
Patient has been instructed to stop taking Plavix  Patient should continue taking all other medications without change through the day before surgery.  Patient should have nothing to eat or drink after midnight the night before surgery.  On the morning of surgery patient should take only Coreg (carvedilol) with a sip of water.

## 2015-08-25 ENCOUNTER — Ambulatory Visit (HOSPITAL_COMMUNITY)
Admission: RE | Admit: 2015-08-25 | Discharge: 2015-08-25 | Disposition: A | Discharge status: Home / Self Care | Payer: Medicaid Other | Source: Ambulatory Visit | Attending: Thoracic Surgery (Cardiothoracic Vascular Surgery) | Admitting: Thoracic Surgery (Cardiothoracic Vascular Surgery)

## 2015-08-25 DIAGNOSIS — I517 Cardiomegaly: Secondary | ICD-10-CM | POA: Diagnosis not present

## 2015-08-25 DIAGNOSIS — Z95810 Presence of automatic (implantable) cardiac defibrillator: Secondary | ICD-10-CM | POA: Diagnosis not present

## 2015-08-25 DIAGNOSIS — I340 Nonrheumatic mitral (valve) insufficiency: Secondary | ICD-10-CM | POA: Diagnosis not present

## 2015-08-25 DIAGNOSIS — I10 Essential (primary) hypertension: Secondary | ICD-10-CM | POA: Insufficient documentation

## 2015-08-25 DIAGNOSIS — I509 Heart failure, unspecified: Secondary | ICD-10-CM | POA: Diagnosis not present

## 2015-08-25 DIAGNOSIS — R29898 Other symptoms and signs involving the musculoskeletal system: Secondary | ICD-10-CM | POA: Insufficient documentation

## 2015-08-25 DIAGNOSIS — Z87891 Personal history of nicotine dependence: Secondary | ICD-10-CM | POA: Insufficient documentation

## 2015-08-25 NOTE — Progress Notes (Signed)
Echocardiogram 2D Echocardiogram has been performed.  Kerry Mason 08/25/2015, 10:41 AM

## 2015-08-30 NOTE — Pre-Procedure Instructions (Addendum)
    Keaten Hanaway  08/30/2015      [id number] [place], [place] [address] Phone: [phone] Fax: [phone]  [place], [place] - [address] [place] [address] Phone: [phone] Fax: [phone]  [facility] [place] ([address] [place] [address] [place] [address] Phone: [phone] Fax: [phone]    Your procedure is scheduled on Thursday, September 29th, 2016 .  Report to [facility] Admitting at 5:30 A.M.  Call this number if you have problems the morning of surgery:  [phone]   Remember:  Do not eat food or drink liquids after midnight.   Take these medicines the morning of surgery with A SIP OF WATER: Acetaminophen (Tylenol) if needed, Allopurinol (Zyloprim), Carvedilol (Coreg), Isosorbide mononitrate (Imdur).    Stop taking the following: Clopidogrel (Plavix), NSAIDS, Aspirin, Aleve, Naproxen, Ibuprofen, Motrin, Advil, BC's, Goody's, Fish Oil, all herbal medications, and all vitamins.    Do not wear jewelry.  Do not wear lotions, powders, or colognes.  You may NOT wear deodorant.  Men may shave face and neck.  Do not bring valuables to the hospital.  [facility] is not responsible for any belongings or valuables.  Contacts, dentures or bridgework may not be worn into surgery.  Leave your suitcase in the car.  After surgery it may be brought to your room.  For patients admitted to the hospital, discharge time will be determined by your treatment team.  Patients discharged the day of surgery will not be allowed to drive home.   Special instructions:  See attached.   Please read over the following fact sheets that you were given. Pain Booklet, Coughing and Deep Breathing, Blood Transfusion Information, MRSA Information and Surgical Site Infection Prevention

## 2015-08-31 ENCOUNTER — Encounter (HOSPITAL_COMMUNITY)
Admission: RE | Admit: 2015-08-31 | Discharge: 2015-08-31 | Disposition: A | Discharge status: Home / Self Care | Payer: Medicaid Other | Source: Ambulatory Visit | Attending: Thoracic Surgery (Cardiothoracic Vascular Surgery) | Admitting: Thoracic Surgery (Cardiothoracic Vascular Surgery)

## 2015-08-31 ENCOUNTER — Ambulatory Visit (HOSPITAL_COMMUNITY)
Admission: RE | Admit: 2015-08-31 | Discharge: 2015-08-31 | Disposition: A | Discharge status: Home / Self Care | Payer: Medicaid Other | Source: Ambulatory Visit | Attending: Thoracic Surgery (Cardiothoracic Vascular Surgery) | Admitting: Thoracic Surgery (Cardiothoracic Vascular Surgery)

## 2015-08-31 ENCOUNTER — Encounter: Payer: Self-pay | Admitting: *Deleted

## 2015-08-31 ENCOUNTER — Encounter (HOSPITAL_COMMUNITY): Payer: Self-pay

## 2015-08-31 VITALS — BP 105/70 | HR 71 | Temp 98.4°F | Resp 18 | Ht 73.0 in | Wt 208.5 lb

## 2015-08-31 DIAGNOSIS — I5022 Chronic systolic (congestive) heart failure: Secondary | ICD-10-CM | POA: Insufficient documentation

## 2015-08-31 DIAGNOSIS — I10 Essential (primary) hypertension: Secondary | ICD-10-CM | POA: Diagnosis not present

## 2015-08-31 DIAGNOSIS — I252 Old myocardial infarction: Secondary | ICD-10-CM | POA: Insufficient documentation

## 2015-08-31 DIAGNOSIS — E785 Hyperlipidemia, unspecified: Secondary | ICD-10-CM | POA: Insufficient documentation

## 2015-08-31 DIAGNOSIS — Z7902 Long term (current) use of antithrombotics/antiplatelets: Secondary | ICD-10-CM | POA: Insufficient documentation

## 2015-08-31 DIAGNOSIS — Z87891 Personal history of nicotine dependence: Secondary | ICD-10-CM | POA: Diagnosis not present

## 2015-08-31 DIAGNOSIS — Z01818 Encounter for other preprocedural examination: Secondary | ICD-10-CM | POA: Diagnosis not present

## 2015-08-31 DIAGNOSIS — I2510 Atherosclerotic heart disease of native coronary artery without angina pectoris: Secondary | ICD-10-CM

## 2015-08-31 DIAGNOSIS — Z95810 Presence of automatic (implantable) cardiac defibrillator: Secondary | ICD-10-CM | POA: Diagnosis not present

## 2015-08-31 DIAGNOSIS — Z955 Presence of coronary angioplasty implant and graft: Secondary | ICD-10-CM | POA: Insufficient documentation

## 2015-08-31 DIAGNOSIS — Z0183 Encounter for blood typing: Secondary | ICD-10-CM | POA: Insufficient documentation

## 2015-08-31 DIAGNOSIS — Z01812 Encounter for preprocedural laboratory examination: Secondary | ICD-10-CM | POA: Diagnosis not present

## 2015-08-31 DIAGNOSIS — I6523 Occlusion and stenosis of bilateral carotid arteries: Secondary | ICD-10-CM | POA: Insufficient documentation

## 2015-08-31 DIAGNOSIS — I480 Paroxysmal atrial fibrillation: Secondary | ICD-10-CM | POA: Insufficient documentation

## 2015-08-31 DIAGNOSIS — I255 Ischemic cardiomyopathy: Secondary | ICD-10-CM | POA: Diagnosis not present

## 2015-08-31 DIAGNOSIS — R9431 Abnormal electrocardiogram [ECG] [EKG]: Secondary | ICD-10-CM | POA: Insufficient documentation

## 2015-08-31 DIAGNOSIS — Z79899 Other long term (current) drug therapy: Secondary | ICD-10-CM | POA: Insufficient documentation

## 2015-08-31 DIAGNOSIS — Z006 Encounter for examination for normal comparison and control in clinical research program: Secondary | ICD-10-CM

## 2015-08-31 HISTORY — DX: Frequency of micturition: R35.0

## 2015-08-31 HISTORY — DX: Personal history of pneumonia (recurrent): Z87.01

## 2015-08-31 HISTORY — DX: Basal cell carcinoma of skin, unspecified: C44.91

## 2015-08-31 HISTORY — DX: Reserved for inherently not codable concepts without codable children: IMO0001

## 2015-08-31 HISTORY — DX: Acute myocardial infarction, unspecified: I21.9

## 2015-08-31 LAB — PULMONARY FUNCTION TEST
DL/VA % pred: 61 %
DL/VA: 2.93 ml/min/mmHg/L
DLCO UNC % PRED: 61 %
DLCO UNC: 22.28 ml/min/mmHg
FEF 25-75 POST: 1.53 L/s
FEF 25-75 PRE: 1.62 L/s
FEF2575-%Change-Post: -5 %
FEF2575-%PRED-POST: 46 %
FEF2575-%PRED-PRE: 49 %
FEV1-%Change-Post: -2 %
FEV1-%PRED-POST: 75 %
FEV1-%Pred-Pre: 77 %
FEV1-POST: 3.04 L
FEV1-Pre: 3.1 L
FEV1FVC-%Change-Post: 2 %
FEV1FVC-%PRED-PRE: 86 %
FEV6-%CHANGE-POST: -3 %
FEV6-%PRED-POST: 87 %
FEV6-%Pred-Pre: 89 %
FEV6-Post: 4.42 L
FEV6-Pre: 4.56 L
FEV6FVC-%CHANGE-POST: 1 %
FEV6FVC-%PRED-PRE: 100 %
FEV6FVC-%Pred-Post: 101 %
FVC-%Change-Post: -4 %
FVC-%PRED-POST: 85 %
FVC-%Pred-Pre: 89 %
FVC-Post: 4.53 L
FVC-Pre: 4.73 L
POST FEV1/FVC RATIO: 67 %
PRE FEV1/FVC RATIO: 66 %
Post FEV6/FVC ratio: 98 %
Pre FEV6/FVC Ratio: 96 %
RV % PRED: 94 %
RV: 2.28 L
TLC % pred: 104 %
TLC: 7.98 L

## 2015-08-31 LAB — TYPE AND SCREEN
ABO/RH(D): O POS
ANTIBODY SCREEN: NEGATIVE

## 2015-08-31 LAB — URINALYSIS, ROUTINE W REFLEX MICROSCOPIC
BILIRUBIN URINE: NEGATIVE
Glucose, UA: NEGATIVE mg/dL
Hgb urine dipstick: NEGATIVE
KETONES UR: NEGATIVE mg/dL
Leukocytes, UA: NEGATIVE
NITRITE: NEGATIVE
Protein, ur: NEGATIVE mg/dL
SPECIFIC GRAVITY, URINE: 1.01 (ref 1.005–1.030)
UROBILINOGEN UA: 0.2 mg/dL (ref 0.0–1.0)
pH: 6 (ref 5.0–8.0)

## 2015-08-31 LAB — BLOOD GAS, ARTERIAL
Acid-base deficit: 1.9 mmol/L (ref 0.0–2.0)
BICARBONATE: 21.8 meq/L (ref 20.0–24.0)
Drawn by: 206361
FIO2: 0.21
O2 Saturation: 97.6 %
PCO2 ART: 33.9 mmHg — AB (ref 35.0–45.0)
PH ART: 7.425 (ref 7.350–7.450)
Patient temperature: 98.6
TCO2: 22.9 mmol/L (ref 0–100)
pO2, Arterial: 95.2 mmHg (ref 80.0–100.0)

## 2015-08-31 LAB — COMPREHENSIVE METABOLIC PANEL
ALBUMIN: 3.7 g/dL (ref 3.5–5.0)
ALK PHOS: 52 U/L (ref 38–126)
ALT: 12 U/L — ABNORMAL LOW (ref 17–63)
ANION GAP: 7 (ref 5–15)
AST: 19 U/L (ref 15–41)
BUN: 15 mg/dL (ref 6–20)
CO2: 21 mmol/L — AB (ref 22–32)
Calcium: 9.1 mg/dL (ref 8.9–10.3)
Chloride: 107 mmol/L (ref 101–111)
Creatinine, Ser: 0.72 mg/dL (ref 0.61–1.24)
GFR calc Af Amer: >60 mL/min (ref >60)
GFR calc non Af Amer: >60 mL/min (ref >60)
GLUCOSE: 99 mg/dL (ref 65–99)
POTASSIUM: 4.6 mmol/L (ref 3.5–5.1)
SODIUM: 135 mmol/L (ref 135–145)
Total Bilirubin: 0.4 mg/dL (ref 0.3–1.2)
Total Protein: 7.9 g/dL (ref 6.5–8.1)

## 2015-08-31 LAB — CBC
HCT: 43.8 % (ref 39.0–52.0)
HEMOGLOBIN: 14.9 g/dL (ref 13.0–17.0)
MCH: 30.4 pg (ref 26.0–34.0)
MCHC: 34 g/dL (ref 30.0–36.0)
MCV: 89.4 fL (ref 78.0–100.0)
Platelets: 262 10*3/uL (ref 150–400)
RBC: 4.9 MIL/uL (ref 4.22–5.81)
RDW: 16.2 % — ABNORMAL HIGH (ref 11.5–15.5)
WBC: 9.7 10*3/uL (ref 4.0–10.5)

## 2015-08-31 LAB — SURGICAL PCR SCREEN
MRSA, PCR: NEGATIVE
STAPHYLOCOCCUS AUREUS: NEGATIVE

## 2015-08-31 LAB — PROTIME-INR
INR: 1.04 (ref 0.00–1.49)
Prothrombin Time: 13.8 seconds (ref 11.6–15.2)

## 2015-08-31 LAB — APTT: aPTT: 29 seconds (ref 24–37)

## 2015-08-31 MED ORDER — ALBUTEROL SULFATE (2.5 MG/3ML) 0.083% IN NEBU
2.5000 mg | INHALATION_SOLUTION | Freq: Once | RESPIRATORY_TRACT | Status: AC
  Administered 2015-08-31: 2.5 mg via RESPIRATORY_TRACT

## 2015-08-31 NOTE — Progress Notes (Signed)
PCP - Dr. Velna Hatchet Cardiologist - Dr. Martinique  EKG- 08/31/15 - Epic CXR- 08/31/15 - Epic  Echo - 08/25/15 - Epic Cardiac Cath - 08/2015 - Epic  Pt. Denies shortness of breath and chest pain at PAT appointment.

## 2015-08-31 NOTE — Progress Notes (Signed)
Kerry Mason was in for pre-op testing for surgery scheduled on 9/29. Patient met criteria for the LEVO-CTS trial. Trial was discussed with patient to include risks versus benefits. Patient was given opportunity to read the consent and ask questions. Patient signed the informed consent prior to performing any study related procedures. A copy of the signed consent was given to Kerry Mason and a copy will be placed in the chart.

## 2015-08-31 NOTE — Progress Notes (Signed)
   08/31/15 0953  OBSTRUCTIVE SLEEP APNEA  Have you ever been diagnosed with sleep apnea through a sleep study? No  Do you snore loudly (loud enough to be heard through closed doors)?  0  Do you often feel tired, fatigued, or sleepy during the daytime (such as falling asleep during driving or talking to someone)? 1  Has anyone observed you stop breathing during your sleep? 0  Do you have, or are you being treated for high blood pressure? 1  BMI more than 35 kg/m2? 0  Age > 50 (1-yes) 1  Neck circumference greater than:Male 16 inches or larger, Male 17inches or larger? 1  Male Gender (Yes=1) 1  Obstructive Sleep Apnea Score 5

## 2015-08-31 NOTE — Progress Notes (Signed)
VASCULAR LAB PRELIMINARY  PRELIMINARY  PRELIMINARY  PRELIMINARY  Pre-op Cardiac Surgery  Carotid Findings:  Patent with no evidence of a significant stenosis - 1-39%. Vertebral arteries demonstrate normal antegrade flow.  Upper Extremity Right Left  Brachial Pressures 92 90  Radial Waveforms Normal Normal  Ulnar Waveforms Normal  Normal  Palmar Arch (Allen's Test) WNL WNL   Findings:      Lower  Extremity Right Left  Dorsalis Pedis 97 103  Posterior Tibial 100 111  Ankle/Brachial Indices 1.0 1.1    Findings:  Normal ABIs at rest.   Sturdivant, Rita D, RVT 08/31/2015, 11:42 AM

## 2015-09-01 ENCOUNTER — Encounter: Payer: Self-pay | Admitting: *Deleted

## 2015-09-01 LAB — HEMOGLOBIN A1C
Hgb A1c MFr Bld: 6 % — ABNORMAL HIGH (ref 4.8–5.6)
MEAN PLASMA GLUCOSE: 126 mg/dL

## 2015-09-01 MED ORDER — EPINEPHRINE HCL 1 MG/ML IJ SOLN
0.0000 ug/min | INTRAVENOUS | Status: DC
  Filled 2015-09-01: qty 4

## 2015-09-01 MED ORDER — CEFUROXIME SODIUM 1.5 G IJ SOLR
1.5000 g | INTRAMUSCULAR | Status: AC
  Administered 2015-09-02: .75 g via INTRAVENOUS
  Administered 2015-09-02: 1.5 g via INTRAVENOUS
  Filled 2015-09-01: qty 1.5

## 2015-09-01 MED ORDER — SODIUM CHLORIDE 0.9 % IV SOLN
INTRAVENOUS | Status: AC
  Administered 2015-09-02: 70 mL/h via INTRAVENOUS
  Filled 2015-09-01: qty 40

## 2015-09-01 MED ORDER — SODIUM CHLORIDE 0.9 % IV SOLN
INTRAVENOUS | Status: DC
  Filled 2015-09-01: qty 30

## 2015-09-01 MED ORDER — SODIUM CHLORIDE 0.9 % IV SOLN
INTRAVENOUS | Status: AC
  Administered 2015-09-02: 1 [IU]/h via INTRAVENOUS
  Filled 2015-09-01: qty 2.5

## 2015-09-01 MED ORDER — METOPROLOL TARTRATE 12.5 MG HALF TABLET: 12.5000 mg | ORAL_TABLET | ORAL | Status: DC

## 2015-09-01 MED ORDER — SODIUM CHLORIDE 0.9 % IV SOLN
INTRAVENOUS | Status: AC
  Administered 2015-09-02: 1000 mL
  Filled 2015-09-01: qty 1000

## 2015-09-01 MED ORDER — PLASMA-LYTE 148 IV SOLN
INTRAVENOUS | Status: AC
  Administered 2015-09-02: 500 mL
  Filled 2015-09-01: qty 2.5

## 2015-09-01 MED ORDER — VANCOMYCIN HCL 10 G IV SOLR
1500.0000 mg | INTRAVENOUS | Status: AC
  Administered 2015-09-02: 1500 mg via INTRAVENOUS
  Filled 2015-09-01: qty 1500

## 2015-09-01 MED ORDER — NITROGLYCERIN IN D5W 200-5 MCG/ML-% IV SOLN
2.0000 ug/min | INTRAVENOUS | Status: AC
  Administered 2015-09-02: 16.6 ug/min via INTRAVENOUS
  Filled 2015-09-01: qty 250

## 2015-09-01 MED ORDER — CHLORHEXIDINE GLUCONATE 0.12 % MT SOLN
15.0000 mL | Freq: Once | OROMUCOSAL | Status: DC
  Filled 2015-09-01: qty 15

## 2015-09-01 MED ORDER — DEXMEDETOMIDINE HCL IN NACL 400 MCG/100ML IV SOLN
0.1000 ug/kg/h | INTRAVENOUS | Status: AC
  Administered 2015-09-02: .2 ug/kg/h via INTRAVENOUS
  Filled 2015-09-01: qty 100

## 2015-09-01 MED ORDER — DOPAMINE-DEXTROSE 3.2-5 MG/ML-% IV SOLN
0.0000 ug/kg/min | INTRAVENOUS | Status: DC
Start: 2015-09-02 — End: 2015-09-02
  Filled 2015-09-01: qty 250

## 2015-09-01 MED ORDER — MAGNESIUM SULFATE 50 % IJ SOLN
40.0000 meq | INTRAMUSCULAR | Status: DC
  Filled 2015-09-01: qty 10

## 2015-09-01 MED ORDER — POTASSIUM CHLORIDE 2 MEQ/ML IV SOLN
80.0000 meq | INTRAVENOUS | Status: DC
  Filled 2015-09-01: qty 40

## 2015-09-01 MED ORDER — PHENYLEPHRINE HCL 10 MG/ML IJ SOLN
30.0000 ug/min | INTRAVENOUS | Status: DC
  Filled 2015-09-01: qty 2

## 2015-09-01 MED ORDER — CHLORHEXIDINE GLUCONATE 4 % EX LIQD: 30.0000 mL | CUTANEOUS | Status: DC

## 2015-09-01 MED ORDER — DEXTROSE 5 % IV SOLN
750.0000 mg | INTRAVENOUS | Status: DC
  Filled 2015-09-01: qty 750

## 2015-09-01 NOTE — Progress Notes (Signed)
Anesthesia Chart Review: Patient is a 59 year old male scheduled for CABG on 09/02/15 by Dr. Roxy Manns.  History includes former smoker, CAD s/p RCA stent '00, STEMI with occlusion of proximal RCA (felt to be chronic) with collaterals and proximal LAD occlusion s/p LAD DES 12/11/13, s/p CTO PCI and RCA DES complicated by coronary perforation with cardiac tamponade treated with pericardiocentesis 02/03/15 (perforation sealed with a Graftmaster coated stent), NSTEMI 06/13/15 (occluded RCA at location of prior stent) with medical therapy recommended but with refractory daily chest pain and CABG recommended following 08/18/15 LHC, ischemic cardiomyopathy, PAF 02/2015 (felt related to pericarditis post perforation/pericardiocentesis), chronic systolic CHF, s/p 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   PCP is Dr. Velna Hatchet. Primary cardiologist is Dr. Martinique. EP cardiologist is Dr. [name].   Meds include allopurinol, ASA, Lipitor, Coreg, Plavix, Lasix, Imdur, magnesium, Nitor, fish oil, lisinopril, spironolactone. Dr. Guy Sandifer 08/23/15 note states patient was instructed to hold Plavix.   08/25/15 Echo: Compared to a prior echo in 02/2015, the EF has improved to 30-35%. There is akinesis of the apex, distal septum and inferoapical walls. AICD leads are in place. There is nopericardial effusion.  08/18/15 LHC:  Mid RCA to Dist RCA lesion, 100% stenosed. The lesion was previously treated with a stent (unknown type) .  LM lesion, 50% stenosed.  Abnormal FFR of the distal left main of 0.76 Based on the above findings I would recommend CABG for complete revascularization. Will arrange outpatient CT surgery consultation. Once surgery date is set would discontinue Plavix 7 days prior.   06/12/14 EKG: NSR, anterior infarct (age undetermined). 12/29/14 EKG (in Muse from strips during stress test): NSR, anterior infarct (old), inferior T wave abnormality, consider  ischemia, non-specific lateral T wave abnormality, rightward axis. Inferior T wave abnormality new/more prominent when compared to 06/2014 EKGs.  08/31/15 Preliminary carotid duplex findings: Patent with no evidence of a significant stenosis - 1-39%. Vertebral arteries demonstrate normal antegrade flow.  Preoperative EKG, CXR, PFTs, and labs noted.  If no acute changes then I anticipate that he can proceed as planned.  Chart left for nursing staff to notify the Biotron[employer] rep.  George Hugh [facility] Short Stay Center/Anesthesiology Phone [phone] 09/01/2015 10:40 AM

## 2015-09-01 NOTE — H&P (Addendum)
Kerry Mason       [place] [address]             [id number]          CARDIOTHORACIC SURGERY HISTORY AND PHYSICAL EXAM  Referring Provider is Mason, Kerry Mason, Kerry Mason PCP is Kerry Hatchet, Kerry Mason  Chief Complaint  Patient presents with  . Coronary Artery Disease    eval for CABG..    HPI:  Patient is a 59 year old male with long-standing history of coronary artery disease, ischemic cardiomyopathy with chronic systolic congestive heart failure, hypertension, hyperlipidemia, and previous history of tobacco abuse who has been referred for surgical consultation to discuss treatment options for management of ischemic heart disease. The patient's cardiac history dates back to 2000 when he presented with angina pectoris. He underwent PCI and stenting of the right coronary artery at that time and apparently was subsequently lost to follow-up. In January 2015 he suffered a massive ST segment elevation myocardial infarction with occlusion of the proximal left anterior descending coronary artery. He was noted to have occlusion of the right coronary artery at that time that was felt to be chronic. He was treated with PCI and stenting of the left anterior descending coronary artery with drug-eluting stents. He subsequently underwent ICD implantation for primary prophylaxis because of ejection fraction estimated 30-35%. In December 2015 he underwent follow-up stress my view because of symptoms of exertional shortness of breath and chest pain. Diagnostic cardiac catheterization revealed widely patent stents in the left anterior descending coronary artery. There remained chronic occlusion of the right coronary artery. He subsequently underwent an attempt at PCI of his chronic total occlusion of the right coronary artery on 02/03/2015. This was complicated by perforation of mid right coronary artery with cardiac tamponade. He was treated with pericardiocentesis. Perforation of the  coronary artery was sealed using a covered stent in the mid right coronary artery. An additional 4 drug eluding stents were placed in the RCA at that time. He recovered without need for open surgical intervention. In July the patient was hospitalized with an acute non-ST segment elevation myocardial infarction. Diagnostic cardiac catheterization performed at that time demonstrated chronic occlusion of the right coronary artery with 50% stenosis of the distal left main coronary artery. He was treated medically. The patient was recently seen in follow-up and continued to complain of fatigue, exertional shortness of breath, and intermittent chest discomfort. Repeat diagnostic cardiac catheterization was performed revealing similar findings with 50% stenosis of the distal left main coronary artery and chronic occlusion of right coronary artery. Fractional flow wire analysis was performed of the left main coronary stenosis, confirming the presence of hemodynamically significant disease. The patient was referred for surgical consultation.  The patient is single and lives with his brother in [place], [place]. He works for the [employer]. He has been functionally independent and physically active most of his life, although ever since his heart attack in 2015 he has experienced progressive symptoms of exertional fatigue and shortness of breath. He currently states that he gets short of breath with moderate physical activity. This does not seem to limit his day-to-day activities to any significant degree. He complains of chronic fatigue. He denies any history of resting shortness of breath, PND, orthopnea or lower extremity edema. He has atypical symptoms of pain across his chest that seemed to come and go sporadically and are not necessarily related to physical exertion. Pain is described as dull and sometimes located  across the left side of his chest, sometimes radiating to left shoulder.  Past  Medical History  Diagnosis Date  . CAD (coronary artery disease)     a. 2000: s/p stent of RCA 2000 with BMS b. 2015: STEMI s/p LHC with old occlusion of RCA and DESx2 to LAD c. 07/08/01: complicated PCI on 3/2 for CTO of mid RCA with coronary perforation and cardiac tamponade requiring emergent pericardiocentesis   . Tobacco abuse   . Obesity   . HTN (hypertension)   . Hyperlipidemia   . Acute systolic CHF (congestive heart failure)   . Ischemic cardiomyopathy     a. 2D ECHO: EF 25-30%. Akinesis of the anteroseptal and  . AICD (automatic cardioverter/defibrillator) present   . Anxiety   . Cardiac tamponade     a. 02/03/15 2/2 coronary perforation during CTO procedure. Sealed with graftmaster coated stent.   . Gout   . Left main coronary artery disease     Past Surgical History  Procedure Laterality Date  . Cardiac catheterization    . Coronary angioplasty  2000  . Coronary angioplasty with stent placement  2015  . Left heart catheterization with coronary angiogram N/A 12/11/2013    Procedure: LEFT HEART CATHETERIZATION WITH CORONARY ANGIOGRAM; Surgeon: Kerry Mason Martinique, Kerry Mason; Location: [facility] CATH LAB; Service: Cardiovascular; Laterality: N/A;  . Percutaneous coronary stent intervention (pci-s)  12/11/2013    Procedure: PERCUTANEOUS CORONARY STENT INTERVENTION (PCI-S); Surgeon: Kerry Mason Martinique, Kerry Mason; Location: [facility] CATH LAB; Service: Cardiovascular;; prov LAD and mid LAD  . Implantable cardioverter defibrillator implant N/A 04/17/2014    Procedure: IMPLANTABLE CARDIOVERTER DEFIBRILLATOR IMPLANT; Surgeon: Evans Lance, Kerry Mason; Location: [facility] CATH LAB; Service: Cardiovascular; Laterality: N/A;  . Left heart catheterization with coronary angiogram N/A 01/05/2015    Procedure: LEFT HEART CATHETERIZATION WITH CORONARY ANGIOGRAM; Surgeon: Kerry Mason Martinique, Kerry Mason; Location: [facility] CATH LAB; Service: Cardiovascular;  Laterality: N/A;  . Percutaneous coronary stent intervention (pci-s) N/A 02/03/2015    Procedure: PERCUTANEOUS CORONARY STENT INTERVENTION (PCI-S); Surgeon: Jettie Booze, Kerry Mason; Location: [facility] CATH LAB; Service: Cardiovascular; Laterality: N/A;  . Cardiac catheterization N/A 06/14/2015    Procedure: Left Heart Cath and Coronary Angiography; Surgeon: Lorretta Harp, Kerry Mason; Location: [place] CV LAB; Service: Cardiovascular; Laterality: N/A;  . Cardiac catheterization N/A 08/18/2015    Procedure: Intravascular Pressure Wire/FFR Study; Surgeon: Kerry Mason Martinique, Kerry Mason; Location: [place] CV LAB; Service: Cardiovascular; Laterality: N/A;    Family History  Problem Relation Age of Onset  . CAD Father     PTCA  . Cancer Mother     LYMPHOMA    Social History   Social History  . Marital Status: Married    Spouse Name: N/A  . Number of Children: 2  . 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 Smoking status: Former Smoker -- 1.00 packs/day for 35 years    Types: Cigarettes    Quit date: 12/11/2013  . Smokeless tobacco: Not on file  . Alcohol Use: No  . Drug Use: No  . Sexual Activity: Not Currently   Other Topics Concern  . Not on file   Social History Narrative    Current Outpatient Prescriptions  Medication Sig Dispense Refill  . acetaminophen (TYLENOL) 325 MG tablet Take 2 tablets (650 mg total) by mouth every 4 (four) hours as needed for headache or mild pain.    Marland Kitchen allopurinol (ZYLOPRIM) 300 MG tablet TAKE ONE  TABLET BY MOUTH ONCE DAILY 30 tablet 3  . aspirin EC 81 MG tablet Take 81 mg by mouth daily.    Marland Kitchen atorvastatin (LIPITOR) 40 MG tablet Take 1 tablet (40 mg total) by mouth daily at 6 PM. 30 tablet 11  . CALCIUM PO Take 1 tablet by mouth daily.    . carvedilol  (COREG) 6.25 MG tablet Take 1 tablet (6.25 mg total) by mouth 2 (two) times daily. 60 tablet 11  . clopidogrel (PLAVIX) 75 MG tablet Take 1 tablet (75 mg total) by mouth daily. 30 tablet 3  . Fluocinonide 0.1 % CREA Apply 1 application topically 3 (three) times daily as needed (rash - on legs, hands and bottom).    . furosemide (LASIX) 40 MG tablet Take 1 tablet (40 mg total) by mouth daily. 30 tablet 11  . isosorbide mononitrate (IMDUR) 30 MG 24 hr tablet Take 1 tablet (30 mg total) by mouth daily. 30 tablet 11  . lisinopril (PRINIVIL,ZESTRIL) 5 MG tablet Take 1 tablet (5 mg total) by mouth daily. 30 tablet 11  . Magnesium 400 MG TABS Take 400 mg by mouth daily.    . Multiple Vitamin (MULTIVITAMIN WITH MINERALS) TABS tablet Take 1 tablet by mouth daily.    Marland Kitchen NITROSTAT 0.4 MG SL tablet DISSOLVE 1 TABLET UNDER THE TONGUE EVERY 5 MINUTES AS NEEDED FOR CHEST PAIN 25 tablet 8  . Omega-3 Fatty Acids (FISH OIL PO) Take 1 capsule by mouth daily.    Marland Kitchen spironolactone (ALDACTONE) 25 MG tablet TAKE ONE TABLET BY MOUTH AT BEDTIME 90 tablet 2   No current facility-administered medications for this visit.    No Known Allergies    Review of Systems:  General:normal appetite, decrased energy, no weight gain, no weight loss, no fever Cardiac:+ chest pain with exertion, + chest pain at rest, + SOB with exertion, no resting SOB, no PND, no orthopnea, + palpitations, no arrhythmia, no atrial fibrillation, no LE edema, + dizzy spells, no syncope Respiratory:+ shortness of breath, no home oxygen, no productive cough, + dry cough, no bronchitis, no wheezing, no hemoptysis, no asthma, no pain with inspiration or cough, no sleep apnea, no CPAP at night GI:no difficulty swallowing, no reflux, no frequent heartburn, no hiatal  hernia, no abdominal pain, no constipation, no diarrhea, no hematochezia, no hematemesis, no melena GU:no dysuria, no frequency, no urinary tract infection, no hematuria, no enlarged prostate, no kidney stones, no kidney disease Vascular:no pain suggestive of claudication, no pain in feet, no leg cramps, no varicose veins, no DVT, no non-healing foot ulcer Neuro:no stroke, no TIA's, no seizures, no headaches, no temporary blindness one eye, no slurred speech, no peripheral neuropathy, no chronic pain, no instability of gait, no memory/cognitive dysfunction Musculoskeletal:no arthritis, no joint swelling, no myalgias, no difficulty walking, normal mobility  Skin:no rash, no itching, no skin infections, no pressure sores or ulcerations Psych:no anxiety, no depression, no nervousness, no unusual recent stress Eyes:+ blurry vision, no floaters, no recent vision changes, no wears glasses or contacts ENT:+ hearing loss, no loose or painful teeth, no dentures, last saw dentist several years ago Hematologic:+ easy bruising, no abnormal bleeding, no clotting disorder, no frequent epistaxis Endocrine:no diabetes, does not check CBG's at home   Physical Exam:  BP 114/74 mmHg  Pulse 70  Resp 16  Ht 6\' 1"  (1.854 Mason)  Wt 197 lb (89.359 kg)  BMI 26.00 kg/m2  SpO2 98% General: well-appearing HEENT:Unremarkable  Neck:no JVD, no bruits, no adenopathy  Chest:clear  to auscultation,  symmetrical breath sounds, no wheezes, no rhonchi  CV:RRR, no murmur  Abdomen:soft, non-tender, no masses  Extremities:warm, well-perfused, pulses palpable, no LE edema Rectal/GUDeferred Neuro:Grossly non-focal and symmetrical throughout Skin:Clean and dry, no rashes, no breakdown   Diagnostic Tests:  CARDIAC CATHETERIZATION  Procedures    Intravascular Pressure Wire/FFR Study    PACS Images    Show images for Cardiac catheterization     Link to Procedure Log    Procedure Log      Indications    Angina pectoris [I20.9 (ICD-10-CM)]    Technique and Indications    Indication: 59 yo WM with history of CAD. He is s/p acute anterior MI in Jan. 2015 treated with DES of LAD. He had CTO of the RCA. He was symptomatic and underwent CTO PCI of the RCA in March 9622 complicated by acute perforation treated with a covered stent. He did well until July 2016 when he presented with NSTEMI and he had reocclusion of the RCA . He has continued to have significant angina despite optimal medical therapy. He was brought back to assess the borderline stenosis of the distal left main to help guide potential revascularization options.    Procedural details: The right groin was prepped, draped, and anesthetized with 1% lidocaine. Using modified Seldinger technique, a 6 French sheath was introduced into the right femoral artery. A 6 Fr. XBLAD 4 guide was used to engage the left main. Angiography demonstrated an eccentric 50% distal left main stenosis before trifurcating into the LAD, a large ramus branch, and the LCx. The LAD stent appeared widely patent. The patient was anticoagulated with IV heparin. After a therapeutic ACT was obtained we passed a Comet FFR wire in  the LAD. Resting FFR was 0.86. With IV adenosine the FFR dropped to 0.76 indicating that the distal left main/ostial LAD lesion was hemodynamically significant. There were no immediate procedural complications. The patient was transferred to the post catheterization recovery area for further monitoring.Estimated blood loss <50 mL. There were no immediate complications during the procedure.    Conclusion     Mid RCA to Dist RCA lesion, 100% stenosed. The lesion was previously treated with a stent (unknown type) .  LM lesion, 50% stenosed.  Abnormal FFR of the distal left main of 0.76  Based on the above findings I would recommend CABG for complete revascularization. Will arrange outpatient CT surgery consultation. Once surgery date is set would discontinue Plavix 7 days prior.      Coronary Findings    Dominance: Right   Left Main   . LM lesion, 50% stenosed. Pressure wire/FFR was performed on the lesion. FFR: 0.76.     Left Anterior Descending   . Mid LAD to Dist LAD lesion, 0% stenosed. Previously placed Mid LAD to Dist LAD stent (unknown type) is patent.     Right Coronary Artery  Dist RCA filled by collaterals from Dist LAD.   Marland Kitchen Prox RCA to Mid RCA lesion, 0% stenosed. Previously placed Prox RCA to Mid RCA stent (unknown type) is patent.   . Mid RCA to Dist RCA lesion, 100% stenosed. The lesion was previously treated with a stent (unknown type) .      Coronary Diagrams    Diagnostic Diagram            Implants    Name ID Temporary Type Supply   ANGIOSEAL 6FR - [id number] No Vascular Products ANGIOSEAL 6FR    Hemo Data  Most Recent Value   AO Systolic Pressure  75 mmHg   AO Diastolic Pressure  52 mmHg   AO Mean  62 mmHg     Procedures    Left Heart Cath and Coronary Angiography    PACS Images    Show images for Cardiac catheterization     Link  to Procedure Log    Procedure Log      Indications    Coronary artery disease due to lipid rich plaque [I25.10 (ICD-10-CM)]   Left ventricular dysfunction [I51.9 (ICD-10-CM)]   Non-STEMI (non-ST elevated myocardial infarction) [I21.4 (ICD-10-CM)]    Technique and Indications    There were no immediate complications during the procedure.    Conclusion     Mid RCA to Dist RCA lesion, 100% stenosed. The lesion was previously treated with a stent (unknown type) .  LM lesion, 50% stenosed.  Kerry Mason is a 59 y.o. male   [id number] LOCATION: FACILITY: [facility]  PHYSICIAN: Kerry Mason, Mason.D. 05-Aug-1956   DATE OF PROCEDURE: 06/14/2015  DATE OF DISCHARGE:     CARDIAC CATHETERIZATION     History obtained from chart review. Kerry Mason is a 60 year old mildly overweight Caucasian male ischemic cardiomyopathy. He had stenting of his LAD in 2015. He has an EF in the 35% range status post ICD placement. Problems include hypertension and hyperlipidemia. He smoked remotely. He has lost 100 pounds last year as a result of bilateral exercise. He underwent a complex chronic total occlusion intervention by Dr. Martinique in March of this year complicated by perforation of the occluded RCA graft Master covered stent. The entire RCA is stented. The patient had chest pain yesterday and was admitted with unstable angina. He had positive enzymes/non-STEMI. His EKG showed lateral T-wave inversion. He presents now for diagnostic coronary arteriography.     IMPRESSION:Mr. Hinckley has an occluded dominant right coronary artery in the midportion at the location of prior stenting. The covered stent was placed over the previously placed drug-eluting stent. He does have grade 2-3 left-to-right collaterals. After discussing this with Dr. Martinique, did not feel intervention placing a third layer stent would be appropriate at this time. He does have a 50% distal left main  stenosis. We will continue medical therapy.  Kerry Mason. Kerry Mason, [facility] 06/14/2015 12:04 PM        Coronary Findings    Dominance: Right   Left Main   . LM lesion, 50% stenosed.     Left Anterior Descending   . Mid LAD to Dist LAD lesion, 0% stenosed. Previously placed Mid LAD to Dist LAD stent (unknown type) is patent.     Right Coronary Artery  Dist RCA filled by collaterals from Dist LAD.   Marland Kitchen Prox RCA to Mid RCA lesion, 0% stenosed. Previously placed Prox RCA to Mid RCA stent (unknown type) is patent.   . Mid RCA to Dist RCA lesion, 100% stenosed. The lesion was previously treated with a stent (unknown type) .      Coronary Diagrams    Diagnostic Diagram            Implants    Name ID Temporary Type Supply   No information to display    Hemo Data       Most Recent Value   AO Systolic Pressure  629 mmHg   AO Diastolic Pressure  71 mmHg   AO Mean  90 mmHg      Transthoracic Echocardiography  Patient:  Kerry Mason, Kerry Mason MR #:  [id number] Study Date: 02/07/2015 Gender:   Mason Age:    32 Height:   188 cm Weight:   90.3 kg BSA:    2.18 Mason^2 Pt. Status: Room:    3E20C  ATTENDING  Kerry Mason, Mason.D. ADMITTING  Kerry Doffing, Kerry Mason ORDERING   Kerry Mason, Mason.D. REFERRING  Kerry Mason, Mason.D. PERFORMING  [facility], Inpatient SONOGRAPHER Kerry Mason  cc:  ------------------------------------------------------------------- LV EF: 25% -  30%  ------------------------------------------------------------------- Indications:   Pericardial effusion 423.9.  ------------------------------------------------------------------- History:  PMH:  Coronary artery disease. PMH:  Myocardial infarction. Risk factors: Hypertension. Dyslipidemia.  ------------------------------------------------------------------- Study Conclusions  - Left ventricle: The  cavity size was normal. Wall thickness was increased in a pattern of moderate LVH. Systolic function was severely reduced. The estimated ejection fraction was in the range of 25% to 30%. There is akinesis of the anteroseptal and apical myocardium. - Aortic root: The aortic root was mildly dilated. - Mitral valve: There was mild regurgitation. - Left atrium: The atrium was mildly dilated. - Right ventricle: Systolic function was mildly reduced.  Impressions:  - Compared to 02/05/15, pericardial effusion now resolved.  Transthoracic echocardiography. Mason-mode, complete 2D, spectral Doppler, and color Doppler. Birthdate: Patient birthdate: January 18, 1956. Age: Patient is 59 yr old. Sex: Gender: male. BMI: 25.6 kg/Mason^2. Blood pressure:   107/68 Patient status: Inpatient. Study date: Study date: 02/07/2015. Study time: 11:19 AM. Location: ICU/CCU  -------------------------------------------------------------------  ------------------------------------------------------------------- Left ventricle: The cavity size was normal. Wall thickness was increased in a pattern of moderate LVH. Systolic function was severely reduced. The estimated ejection fraction was in the range of 25% to 30%. Regional wall motion abnormalities:  There is akinesis of the anteroseptal and apical myocardium.  ------------------------------------------------------------------- Aortic valve:  Trileaflet; mildly calcified leaflets. Mobility was not restricted. Doppler: Transvalvular velocity was within the normal range. There was no stenosis. There was no regurgitation.  ------------------------------------------------------------------- Aorta: Aortic root: The aortic root was mildly dilated.  ------------------------------------------------------------------- Mitral valve:  Structurally normal valve.  Mobility was not restricted. Doppler: Transvalvular velocity was within the  normal range. There was no evidence for stenosis. There was mild regurgitation.  ------------------------------------------------------------------- Left atrium: The atrium was mildly dilated.  ------------------------------------------------------------------- Right ventricle: The cavity size was normal. Pacer wire or catheter noted in right ventricle. Systolic function was mildly reduced.  ------------------------------------------------------------------- Pulmonic valve:  Doppler: Transvalvular velocity was within the normal range. There was no evidence for stenosis.  ------------------------------------------------------------------- Tricuspid valve:  Structurally normal valve.  Doppler: Transvalvular velocity was within the normal range. There was mild regurgitation.  ------------------------------------------------------------------- Pulmonary artery:  Systolic pressure was within the normal range.  ------------------------------------------------------------------- Right atrium: The atrium was normal in size. Pacer wire or catheter noted in right atrium.  ------------------------------------------------------------------- Pericardium: There was no pericardial effusion.  ------------------------------------------------------------------- Systemic veins: Inferior vena cava: The vessel was normal in size.  ------------------------------------------------------------------- Measurements  Left ventricle             Value    Reference LV ID, ED, PLAX chordal        45.3 mm   43 - 52 LV ID, ES, PLAX chordal    (H)   42.2 mm   23 - 38 LV fx shortening, PLAX chordal (L)   7   %   >=29 LV PW thickness, ED          14.7 mm   --------- IVS/LV PW ratio, ED          1.03     <=1.3 LV e&', lateral  7.72 cm/s  --------- LV e&', medial             8.37  cm/s  --------- LV e&', average             8.05 cm/s  ---------  Ventricular septum           Value    Reference IVS thickness, ED           15.1 mm   ---------  LVOT                  Value    Reference LVOT peak velocity, S         94.6 cm/s  --------- LVOT mean velocity, S         54.1 cm/s  --------- LVOT VTI, S              15.4 cm   --------- LVOT peak gradient, S         4   mm Hg ---------  Aorta                 Value    Reference Aortic root ID, ED           40  mm   ---------  Left atrium              Value    Reference LA ID, A-P, ES             49  mm   --------- LA ID/bsa, A-P         (H)   2.25 cm/Mason^2 <=2.2  Pulmonary arteries           Value    Reference PA pressure, S, DP           24  mm Hg <=30  Tricuspid valve            Value    Reference Tricuspid regurg peak velocity     229  cm/s  --------- Tricuspid peak RV-RA gradient     21  mm Hg ---------  Systemic veins             Value    Reference Estimated CVP             3   mm Hg ---------  Right ventricle            Value    Reference RV pressure, S, DP           24  mm Hg <=30  Legend: (L) and (H) mark values outside specified reference range.  ------------------------------------------------------------------- Prepared and Electronically Authenticated by  Kerry Mason 2016-03-06T14:36:14    Impression:  Patient has left main coronary artery disease and multivessel coronary artery disease with chronic occlusion of the right coronary artery. He presents with long-standing symptoms of exertional fatigue and shortness of breath consistent with chronic systolic  congestive heart failure, [place] Heart Association functional class II. He also has intermittent symptoms of chest discomfort that are somewhat atypical in nature but potentially consistent with angina pectoris. I personally reviewed the patient's last several cardiac catheterizations. The patient has 50% stenosis of the left main coronary artery, and this has been confirmed to be hemodynamically significant using fractional flow wire analysis. There is chronic occlusion of the right coronary artery with left-to-right collateral filling of the terminal branches. The terminal branches of the distal right coronary system are quite small and may or may not be  large enough for grafting. I agree the patient would best be treated with surgical revascularization. Risks associated with surgery and the technical aspects of bypass grafting may be affected by the patient's previous history of perforation of the right coronary artery with pericardial tamponade.   Plan:  I have reviewed the indications, risks, and potential benefits of coronary artery bypass grafting with the patient and his family. Alternative treatment strategies have been discussed, including the relative risks, benefits and long term prognosis associated with medical therapy, percutaneous coronary intervention, and surgical revascularization. The patient understands and accepts all potential associated risks of surgery including but not limited to risk of death, stroke or other neurologic complication, myocardial infarction, congestive heart failure, respiratory failure, renal failure, bleeding requiring blood transfusion and/or reexploration, aortic dissection or other major vascular complication, arrhythmia, heart block or bradycardia requiring permanent pacemaker, pneumonia, pleural effusion, wound infection, pulmonary embolus or other thromboembolic complication, chronic pain or other delayed complications related to median sternotomy, or the  late recurrence of symptomatic ischemic heart disease and/or congestive heart failure. The importance of long term risk modification have been emphasized. All questions answered. We will obtain a follow-up transthoracic echocardiogram to reassess left ventricular function prior to surgery. We tentatively plan to proceed with surgery on Thursday, 09/02/2015. The patient has been instructed to stop taking Plavix in anticipation of surgery.   I spent in excess of 90 minutes during the conduct of this office consultation and >50% of this time involved direct face-to-face encounter with the patient for counseling and/or coordination of their care.    Valentina Gu. Roxy Manns, Kerry Mason 08/23/2015 12:48 PM

## 2015-09-01 NOTE — Progress Notes (Signed)
Call to Doristine Mango, Rep. For [employer], he was informed of the need for management of the ICD in periop setting prior to CABG, he will arrive at 0700.

## 2015-09-02 ENCOUNTER — Inpatient Hospital Stay (HOSPITAL_COMMUNITY): Payer: Medicaid Other | Admitting: Certified Registered"

## 2015-09-02 ENCOUNTER — Encounter (HOSPITAL_COMMUNITY): Payer: Self-pay | Admitting: *Deleted

## 2015-09-02 ENCOUNTER — Encounter (HOSPITAL_COMMUNITY)
Admission: RE | Disposition: A | Discharge status: Home / Self Care | Payer: Medicaid Other | Source: Ambulatory Visit | Attending: Thoracic Surgery (Cardiothoracic Vascular Surgery)

## 2015-09-02 ENCOUNTER — Inpatient Hospital Stay (HOSPITAL_COMMUNITY): Payer: Medicaid Other

## 2015-09-02 ENCOUNTER — Inpatient Hospital Stay (HOSPITAL_COMMUNITY)
Admission: RE | Admit: 2015-09-02 | Discharge: 2015-09-02 | Disposition: A | Discharge status: Home / Self Care | Payer: Medicaid Other | Source: Ambulatory Visit | Attending: Thoracic Surgery (Cardiothoracic Vascular Surgery) | Admitting: Thoracic Surgery (Cardiothoracic Vascular Surgery)

## 2015-09-02 ENCOUNTER — Inpatient Hospital Stay (HOSPITAL_COMMUNITY)
Admission: RE | Admit: 2015-09-02 | Discharge: 2015-09-07 | DRG: 236 | Disposition: A | Discharge status: Home / Self Care | Payer: Medicaid Other | Source: Ambulatory Visit | Attending: Thoracic Surgery (Cardiothoracic Vascular Surgery) | Admitting: Thoracic Surgery (Cardiothoracic Vascular Surgery)

## 2015-09-02 ENCOUNTER — Inpatient Hospital Stay (HOSPITAL_COMMUNITY): Payer: Medicaid Other | Admitting: Vascular Surgery

## 2015-09-02 DIAGNOSIS — Z7982 Long term (current) use of aspirin: Secondary | ICD-10-CM | POA: Diagnosis not present

## 2015-09-02 DIAGNOSIS — E669 Obesity, unspecified: Secondary | ICD-10-CM | POA: Diagnosis present

## 2015-09-02 DIAGNOSIS — I252 Old myocardial infarction: Secondary | ICD-10-CM | POA: Diagnosis not present

## 2015-09-02 DIAGNOSIS — I10 Essential (primary) hypertension: Secondary | ICD-10-CM | POA: Diagnosis present

## 2015-09-02 DIAGNOSIS — I25110 Atherosclerotic heart disease of native coronary artery with unstable angina pectoris: Secondary | ICD-10-CM | POA: Diagnosis present

## 2015-09-02 DIAGNOSIS — D696 Thrombocytopenia, unspecified: Secondary | ICD-10-CM | POA: Diagnosis not present

## 2015-09-02 DIAGNOSIS — Z955 Presence of coronary angioplasty implant and graft: Secondary | ICD-10-CM | POA: Diagnosis not present

## 2015-09-02 DIAGNOSIS — Z79899 Other long term (current) drug therapy: Secondary | ICD-10-CM | POA: Diagnosis not present

## 2015-09-02 DIAGNOSIS — I2583 Coronary atherosclerosis due to lipid rich plaque: Secondary | ICD-10-CM | POA: Diagnosis present

## 2015-09-02 DIAGNOSIS — Z87891 Personal history of nicotine dependence: Secondary | ICD-10-CM

## 2015-09-02 DIAGNOSIS — F419 Anxiety disorder, unspecified: Secondary | ICD-10-CM | POA: Diagnosis present

## 2015-09-02 DIAGNOSIS — M109 Gout, unspecified: Secondary | ICD-10-CM | POA: Diagnosis present

## 2015-09-02 DIAGNOSIS — Z951 Presence of aortocoronary bypass graft: Secondary | ICD-10-CM

## 2015-09-02 DIAGNOSIS — I2510 Atherosclerotic heart disease of native coronary artery without angina pectoris: Secondary | ICD-10-CM | POA: Diagnosis present

## 2015-09-02 DIAGNOSIS — I5022 Chronic systolic (congestive) heart failure: Secondary | ICD-10-CM | POA: Diagnosis present

## 2015-09-02 DIAGNOSIS — Z7902 Long term (current) use of antithrombotics/antiplatelets: Secondary | ICD-10-CM | POA: Diagnosis not present

## 2015-09-02 DIAGNOSIS — Z6826 Body mass index (BMI) 26.0-26.9, adult: Secondary | ICD-10-CM

## 2015-09-02 DIAGNOSIS — K5900 Constipation, unspecified: Secondary | ICD-10-CM | POA: Diagnosis not present

## 2015-09-02 DIAGNOSIS — D62 Acute posthemorrhagic anemia: Secondary | ICD-10-CM | POA: Diagnosis not present

## 2015-09-02 DIAGNOSIS — J9811 Atelectasis: Secondary | ICD-10-CM | POA: Diagnosis not present

## 2015-09-02 DIAGNOSIS — I200 Unstable angina: Secondary | ICD-10-CM | POA: Diagnosis present

## 2015-09-02 DIAGNOSIS — I480 Paroxysmal atrial fibrillation: Secondary | ICD-10-CM | POA: Diagnosis present

## 2015-09-02 DIAGNOSIS — I255 Ischemic cardiomyopathy: Secondary | ICD-10-CM | POA: Diagnosis present

## 2015-09-02 DIAGNOSIS — J449 Chronic obstructive pulmonary disease, unspecified: Secondary | ICD-10-CM | POA: Diagnosis present

## 2015-09-02 DIAGNOSIS — Z95810 Presence of automatic (implantable) cardiac defibrillator: Secondary | ICD-10-CM | POA: Diagnosis not present

## 2015-09-02 DIAGNOSIS — I5042 Chronic combined systolic (congestive) and diastolic (congestive) heart failure: Secondary | ICD-10-CM | POA: Diagnosis present

## 2015-09-02 DIAGNOSIS — I310 Chronic adhesive pericarditis: Secondary | ICD-10-CM | POA: Diagnosis present

## 2015-09-02 DIAGNOSIS — E785 Hyperlipidemia, unspecified: Secondary | ICD-10-CM | POA: Diagnosis present

## 2015-09-02 DIAGNOSIS — Z9689 Presence of other specified functional implants: Secondary | ICD-10-CM

## 2015-09-02 DIAGNOSIS — Z9861 Coronary angioplasty status: Secondary | ICD-10-CM

## 2015-09-02 HISTORY — PX: CORONARY ARTERY BYPASS GRAFT: SHX141

## 2015-09-02 HISTORY — DX: Presence of aortocoronary bypass graft: Z95.1

## 2015-09-02 HISTORY — PX: TEE WITHOUT CARDIOVERSION: SHX5443

## 2015-09-02 LAB — CBC
HCT: 39.5 % (ref 39.0–52.0)
HCT: 35.9 % — ABNORMAL LOW (ref 39.0–52.0)
HCT: 30.2 % — ABNORMAL LOW (ref 39.0–52.0)
Hemoglobin: 13.4 g/dL (ref 13.0–17.0)
Hemoglobin: 11.6 g/dL — ABNORMAL LOW (ref 13.0–17.0)
Hemoglobin: 10.3 g/dL — ABNORMAL LOW (ref 13.0–17.0)
MCH: 30.4 pg (ref 26.0–34.0)
MCH: 29.1 pg (ref 26.0–34.0)
MCH: 30.5 pg (ref 26.0–34.0)
MCHC: 33.9 g/dL (ref 30.0–36.0)
MCHC: 32.3 g/dL (ref 30.0–36.0)
MCHC: 34.1 g/dL (ref 30.0–36.0)
MCV: 89.6 fL (ref 78.0–100.0)
MCV: 90 fL (ref 78.0–100.0)
MCV: 89.3 fL (ref 78.0–100.0)
PLATELETS: 232 10*3/uL (ref 150–400)
PLATELETS: 168 10*3/uL (ref 150–400)
PLATELETS: 156 10*3/uL (ref 150–400)
RBC: 4.41 MIL/uL (ref 4.22–5.81)
RBC: 3.99 MIL/uL — ABNORMAL LOW (ref 4.22–5.81)
RBC: 3.38 MIL/uL — ABNORMAL LOW (ref 4.22–5.81)
RDW: 16.4 % — AB (ref 11.5–15.5)
RDW: 16.1 % — AB (ref 11.5–15.5)
RDW: 16.2 % — ABNORMAL HIGH (ref 11.5–15.5)
WBC: 8.9 10*3/uL (ref 4.0–10.5)
WBC: 23.6 10*3/uL — ABNORMAL HIGH (ref 4.0–10.5)
WBC: 19.8 10*3/uL — ABNORMAL HIGH (ref 4.0–10.5)

## 2015-09-02 LAB — GLUCOSE, CAPILLARY
GLUCOSE-CAPILLARY: 95 mg/dL (ref 65–99)
Glucose-Capillary: 107 mg/dL — ABNORMAL HIGH (ref 65–99)
Glucose-Capillary: 115 mg/dL — ABNORMAL HIGH (ref 65–99)
Glucose-Capillary: 121 mg/dL — ABNORMAL HIGH (ref 65–99)

## 2015-09-02 LAB — POCT I-STAT, CHEM 8
BUN: 17 mg/dL (ref 6–20)
BUN: 20 mg/dL (ref 6–20)
BUN: 17 mg/dL (ref 6–20)
BUN: 15 mg/dL (ref 6–20)
CALCIUM ION: 1.07 mmol/L — AB (ref 1.12–1.23)
CALCIUM ION: 1.17 mmol/L (ref 1.12–1.23)
CHLORIDE: 115 mmol/L — AB (ref 101–111)
CHLORIDE: 106 mmol/L (ref 101–111)
CREATININE: 0.7 mg/dL (ref 0.61–1.24)
Calcium, Ion: 1.33 mmol/L — ABNORMAL HIGH (ref 1.12–1.23)
Calcium, Ion: 1.21 mmol/L (ref 1.12–1.23)
Chloride: 104 mmol/L (ref 101–111)
Chloride: 105 mmol/L (ref 101–111)
Creatinine, Ser: 0.8 mg/dL (ref 0.61–1.24)
Creatinine, Ser: 0.7 mg/dL (ref 0.61–1.24)
Creatinine, Ser: 0.8 mg/dL (ref 0.61–1.24)
GLUCOSE: 101 mg/dL — AB (ref 65–99)
Glucose, Bld: 122 mg/dL — ABNORMAL HIGH (ref 65–99)
Glucose, Bld: 207 mg/dL — ABNORMAL HIGH (ref 65–99)
Glucose, Bld: 129 mg/dL — ABNORMAL HIGH (ref 65–99)
HCT: 37 % — ABNORMAL LOW (ref 39.0–52.0)
HEMATOCRIT: 38 % — AB (ref 39.0–52.0)
HEMATOCRIT: 32 % — AB (ref 39.0–52.0)
HEMATOCRIT: 31 % — AB (ref 39.0–52.0)
HEMOGLOBIN: 12.9 g/dL — AB (ref 13.0–17.0)
Hemoglobin: 12.6 g/dL — ABNORMAL LOW (ref 13.0–17.0)
Hemoglobin: 10.9 g/dL — ABNORMAL LOW (ref 13.0–17.0)
Hemoglobin: 10.5 g/dL — ABNORMAL LOW (ref 13.0–17.0)
POTASSIUM: 4.9 mmol/L (ref 3.5–5.1)
Potassium: 4 mmol/L (ref 3.5–5.1)
Potassium: 4.5 mmol/L (ref 3.5–5.1)
Potassium: 4.4 mmol/L (ref 3.5–5.1)
SODIUM: 132 mmol/L — AB (ref 135–145)
SODIUM: 137 mmol/L (ref 135–145)
SODIUM: 136 mmol/L (ref 135–145)
SODIUM: 139 mmol/L (ref 135–145)
TCO2: 28 mmol/L (ref 0–100)
TCO2: 30 mmol/L (ref 0–100)
TCO2: 23 mmol/L (ref 0–100)
TCO2: 20 mmol/L (ref 0–100)

## 2015-09-02 LAB — POCT I-STAT 3, ART BLOOD GAS (G3+)
ACID-BASE DEFICIT: 4 mmol/L — AB (ref 0.0–2.0)
ACID-BASE DEFICIT: 3 mmol/L — AB (ref 0.0–2.0)
ACID-BASE DEFICIT: 4 mmol/L — AB (ref 0.0–2.0)
ACID-BASE DEFICIT: 5 mmol/L — AB (ref 0.0–2.0)
Acid-base deficit: 4 mmol/L — ABNORMAL HIGH (ref 0.0–2.0)
BICARBONATE: 23.5 meq/L (ref 20.0–24.0)
Bicarbonate: 27.1 mEq/L — ABNORMAL HIGH (ref 20.0–24.0)
Bicarbonate: 26.4 mEq/L — ABNORMAL HIGH (ref 20.0–24.0)
Bicarbonate: 25 mEq/L — ABNORMAL HIGH (ref 20.0–24.0)
Bicarbonate: 23.8 mEq/L (ref 20.0–24.0)
Bicarbonate: 21.6 mEq/L (ref 20.0–24.0)
Bicarbonate: 20.3 mEq/L (ref 20.0–24.0)
O2 SAT: 100 %
O2 SAT: 99 %
O2 SAT: 90 %
O2 SAT: 99 %
O2 SAT: 95 %
O2 SAT: 89 %
O2 Saturation: 100 %
PCO2 ART: 53.9 mmHg — AB (ref 35.0–45.0)
PCO2 ART: 49.3 mmHg — AB (ref 35.0–45.0)
PCO2 ART: 55.1 mmHg — AB (ref 35.0–45.0)
PCO2 ART: 64.7 mmHg — AB (ref 35.0–45.0)
PCO2 ART: 45.8 mmHg — AB (ref 35.0–45.0)
PH ART: 7.337 — AB (ref 7.350–7.450)
PH ART: 7.334 — AB (ref 7.350–7.450)
PH ART: 7.37 (ref 7.350–7.450)
PO2 ART: 498 mmHg — AB (ref 80.0–100.0)
PO2 ART: 140 mmHg — AB (ref 80.0–100.0)
PO2 ART: 72 mmHg — AB (ref 80.0–100.0)
PO2 ART: 171 mmHg — AB (ref 80.0–100.0)
Patient temperature: 36.4
Patient temperature: 35.9
Patient temperature: 36.8
TCO2: 29 mmol/L (ref 0–100)
TCO2: 28 mmol/L (ref 0–100)
TCO2: 25 mmol/L (ref 0–100)
TCO2: 27 mmol/L (ref 0–100)
TCO2: 25 mmol/L (ref 0–100)
TCO2: 23 mmol/L (ref 0–100)
TCO2: 21 mmol/L (ref 0–100)
pCO2 arterial: 40.4 mmHg (ref 35.0–45.0)
pCO2 arterial: 34.9 mmHg — ABNORMAL LOW (ref 35.0–45.0)
pH, Arterial: 7.309 — ABNORMAL LOW (ref 7.350–7.450)
pH, Arterial: 7.239 — ABNORMAL LOW (ref 7.350–7.450)
pH, Arterial: 7.193 — CL (ref 7.350–7.450)
pH, Arterial: 7.319 — ABNORMAL LOW (ref 7.350–7.450)
pO2, Arterial: 413 mmHg — ABNORMAL HIGH (ref 80.0–100.0)
pO2, Arterial: 75 mmHg — ABNORMAL LOW (ref 80.0–100.0)
pO2, Arterial: 57 mmHg — ABNORMAL LOW (ref 80.0–100.0)

## 2015-09-02 LAB — CK TOTAL AND CKMB (NOT AT ARMC)
CK, MB: 2.4 ng/mL (ref 0.5–5.0)
RELATIVE INDEX: 2.3 (ref 0.0–2.5)
Total CK: 105 U/L (ref 49–397)

## 2015-09-02 LAB — BASIC METABOLIC PANEL
Anion gap: 7 (ref 5–15)
BUN: 18 mg/dL (ref 6–20)
CALCIUM: 8.7 mg/dL — AB (ref 8.9–10.3)
CO2: 24 mmol/L (ref 22–32)
CREATININE: 0.78 mg/dL (ref 0.61–1.24)
Chloride: 105 mmol/L (ref 101–111)
GLUCOSE: 102 mg/dL — AB (ref 65–99)
Potassium: 4.3 mmol/L (ref 3.5–5.1)
Sodium: 136 mmol/L (ref 135–145)

## 2015-09-02 LAB — APTT: aPTT: 41 seconds — ABNORMAL HIGH (ref 24–37)

## 2015-09-02 LAB — BRAIN NATRIURETIC PEPTIDE: B Natriuretic Peptide: 83.6 pg/mL (ref 0.0–100.0)

## 2015-09-02 LAB — POCT I-STAT 4, (NA,K, GLUC, HGB,HCT)
GLUCOSE: 100 mg/dL — AB (ref 65–99)
HEMATOCRIT: 37 % — AB (ref 39.0–52.0)
HEMOGLOBIN: 12.6 g/dL — AB (ref 13.0–17.0)
Potassium: 4.3 mmol/L (ref 3.5–5.1)
Sodium: 141 mmol/L (ref 135–145)

## 2015-09-02 LAB — HEMOGLOBIN AND HEMATOCRIT, BLOOD
HCT: 31.1 % — ABNORMAL LOW (ref 39.0–52.0)
Hemoglobin: 10.7 g/dL — ABNORMAL LOW (ref 13.0–17.0)

## 2015-09-02 LAB — CREATININE, SERUM
CREATININE: 0.82 mg/dL (ref 0.61–1.24)
GFR calc Af Amer: >60 mL/min (ref >60)

## 2015-09-02 LAB — TROPONIN I

## 2015-09-02 LAB — PROTIME-INR
INR: 1.41 (ref 0.00–1.49)
PROTHROMBIN TIME: 17.3 s — AB (ref 11.6–15.2)

## 2015-09-02 LAB — MAGNESIUM: Magnesium: 2.4 mg/dL (ref 1.7–2.4)

## 2015-09-02 LAB — PLATELET COUNT: Platelets: 182 10*3/uL (ref 150–400)

## 2015-09-02 SURGERY — CORONARY ARTERY BYPASS GRAFTING (CABG)
Anesthesia: General | Site: Chest

## 2015-09-02 MED ORDER — ROCURONIUM BROMIDE 50 MG/5ML IV SOLN
INTRAVENOUS | Status: AC
  Filled 2015-09-02: qty 2

## 2015-09-02 MED ORDER — NITROGLYCERIN IN D5W 200-5 MCG/ML-% IV SOLN: 0.0000 ug/min | INTRAVENOUS | Status: DC

## 2015-09-02 MED ORDER — ROCURONIUM BROMIDE 50 MG/5ML IV SOLN
INTRAVENOUS | Status: AC
  Filled 2015-09-02: qty 3

## 2015-09-02 MED ORDER — ALBUMIN HUMAN 5 % IV SOLN
INTRAVENOUS | Status: DC | PRN
  Administered 2015-09-02 (×4): via INTRAVENOUS

## 2015-09-02 MED ORDER — MAGNESIUM SULFATE 4 GM/100ML IV SOLN
4.0000 g | Freq: Once | INTRAVENOUS | Status: AC
  Administered 2015-09-02: 4 g via INTRAVENOUS
  Filled 2015-09-02: qty 100

## 2015-09-02 MED ORDER — CHLORHEXIDINE GLUCONATE 0.12% ORAL RINSE (MEDLINE KIT)
15.0000 mL | Freq: Two times a day (BID) | OROMUCOSAL | Status: DC
  Administered 2015-09-02 – 2015-09-03 (×2): 15 mL via OROMUCOSAL

## 2015-09-02 MED ORDER — MORPHINE SULFATE (PF) 2 MG/ML IV SOLN: 1.0000 mg | INTRAVENOUS | Status: DC | PRN

## 2015-09-02 MED ORDER — VANCOMYCIN HCL 1000 MG IV SOLR
INTRAVENOUS | Status: DC
  Filled 2015-09-02: qty 1000

## 2015-09-02 MED ORDER — PHENYLEPHRINE 40 MCG/ML (10ML) SYRINGE FOR IV PUSH (FOR BLOOD PRESSURE SUPPORT)
PREFILLED_SYRINGE | INTRAVENOUS | Status: AC
  Filled 2015-09-02: qty 10

## 2015-09-02 MED ORDER — SODIUM CHLORIDE 0.9 % IJ SOLN
INTRAMUSCULAR | Status: DC | PRN
  Administered 2015-09-02 (×3): 4 mL via TOPICAL

## 2015-09-02 MED ORDER — ACETAMINOPHEN 160 MG/5ML PO SOLN: 650.0000 mg | Freq: Once | ORAL | Status: AC

## 2015-09-02 MED ORDER — PROTAMINE SULFATE 10 MG/ML IV SOLN
INTRAVENOUS | Status: DC | PRN
  Administered 2015-09-02: 60 mg via INTRAVENOUS
  Administered 2015-09-02 (×2): 50 mg via INTRAVENOUS

## 2015-09-02 MED ORDER — SODIUM CHLORIDE 0.45 % IV SOLN
INTRAVENOUS | Status: DC | PRN
  Administered 2015-09-02: 14:00:00 via INTRAVENOUS

## 2015-09-02 MED ORDER — NOREPINEPHRINE BITARTRATE 1 MG/ML IV SOLN
0.0000 ug/min | INTRAVENOUS | Status: DC
  Administered 2015-09-02: 15 ug/min via INTRAVENOUS
  Administered 2015-09-03: 4 ug/min via INTRAVENOUS
  Filled 2015-09-02 (×2): qty 4

## 2015-09-02 MED ORDER — ONDANSETRON HCL 4 MG/2ML IJ SOLN
4.0000 mg | Freq: Four times a day (QID) | INTRAMUSCULAR | Status: DC | PRN
  Administered 2015-09-03 – 2015-09-04 (×5): 4 mg via INTRAVENOUS
  Filled 2015-09-02 (×5): qty 2

## 2015-09-02 MED ORDER — BISACODYL 10 MG RE SUPP: 10.0000 mg | Freq: Every day | RECTAL | Status: DC

## 2015-09-02 MED ORDER — FENTANYL CITRATE (PF) 250 MCG/5ML IJ SOLN
INTRAMUSCULAR | Status: AC
  Filled 2015-09-02: qty 5

## 2015-09-02 MED ORDER — MIDAZOLAM HCL 2 MG/2ML IJ SOLN
INTRAMUSCULAR | Status: AC
  Filled 2015-09-02: qty 4

## 2015-09-02 MED ORDER — STUDY - INVESTIGATIONAL DRUG SIMPLE RECORD
0.2000 ug/kg/min | Status: AC
  Filled 2015-09-02: qty 0.01

## 2015-09-02 MED ORDER — DEXMEDETOMIDINE HCL IN NACL 200 MCG/50ML IV SOLN
0.0000 ug/kg/h | INTRAVENOUS | Status: DC
  Administered 2015-09-02 – 2015-09-03 (×2): 0.2 ug/kg/h via INTRAVENOUS
  Filled 2015-09-02 (×2): qty 50

## 2015-09-02 MED ORDER — METOPROLOL TARTRATE 1 MG/ML IV SOLN: 2.5000 mg | INTRAVENOUS | Status: DC | PRN

## 2015-09-02 MED ORDER — SODIUM CHLORIDE 0.9 % IV SOLN: INTRAVENOUS | Status: DC

## 2015-09-02 MED ORDER — SODIUM CHLORIDE 0.9 % IJ SOLN
3.0000 mL | Freq: Two times a day (BID) | INTRAMUSCULAR | Status: DC
  Administered 2015-09-03 – 2015-09-07 (×8): 3 mL via INTRAVENOUS

## 2015-09-02 MED ORDER — HEPARIN SODIUM (PORCINE) 1000 UNIT/ML IJ SOLN
INTRAMUSCULAR | Status: DC | PRN
  Administered 2015-09-02: 34000 [IU] via INTRAVENOUS

## 2015-09-02 MED ORDER — ASPIRIN 81 MG PO CHEW: 324.0000 mg | CHEWABLE_TABLET | Freq: Every day | ORAL | Status: DC

## 2015-09-02 MED ORDER — ACETAMINOPHEN 650 MG RE SUPP
650.0000 mg | Freq: Once | RECTAL | Status: AC
  Administered 2015-09-02: 650 mg via RECTAL

## 2015-09-02 MED ORDER — PHENYLEPHRINE HCL 10 MG/ML IJ SOLN
INTRAMUSCULAR | Status: DC | PRN
  Administered 2015-09-02: 80 mg via INTRAVENOUS

## 2015-09-02 MED ORDER — BISACODYL 5 MG PO TBEC
10.0000 mg | DELAYED_RELEASE_TABLET | Freq: Every day | ORAL | Status: DC
  Administered 2015-09-03 – 2015-09-07 (×3): 10 mg via ORAL
  Filled 2015-09-02 (×3): qty 2

## 2015-09-02 MED ORDER — SODIUM CHLORIDE 0.9 % IV SOLN: 250.0000 mL | INTRAVENOUS | Status: DC

## 2015-09-02 MED ORDER — PROPOFOL 10 MG/ML IV BOLUS
INTRAVENOUS | Status: DC | PRN
  Administered 2015-09-02: 40 mg via INTRAVENOUS

## 2015-09-02 MED ORDER — LACTATED RINGERS IV SOLN
INTRAVENOUS | Status: DC | PRN
  Administered 2015-09-02 (×3): via INTRAVENOUS

## 2015-09-02 MED ORDER — LIDOCAINE HCL (CARDIAC) 20 MG/ML IV SOLN
INTRAVENOUS | Status: AC
  Filled 2015-09-02: qty 5

## 2015-09-02 MED ORDER — MIDAZOLAM HCL 10 MG/2ML IJ SOLN
INTRAMUSCULAR | Status: AC
Start: 2015-09-02 — End: 2015-09-02
  Filled 2015-09-02: qty 4

## 2015-09-02 MED ORDER — STUDY - INVESTIGATIONAL DRUG SIMPLE RECORD
0.1000 ug/kg/min | Status: DC
  Filled 2015-09-02: qty 0.01

## 2015-09-02 MED ORDER — DEXTROSE 5 % IV SOLN
1.5000 g | Freq: Two times a day (BID) | INTRAVENOUS | Status: AC
  Administered 2015-09-02 – 2015-09-04 (×4): 1.5 g via INTRAVENOUS
  Filled 2015-09-02 (×4): qty 1.5

## 2015-09-02 MED ORDER — PROPOFOL 10 MG/ML IV BOLUS
INTRAVENOUS | Status: AC
  Filled 2015-09-02: qty 20

## 2015-09-02 MED ORDER — EPHEDRINE SULFATE 50 MG/ML IJ SOLN
INTRAMUSCULAR | Status: DC | PRN
  Administered 2015-09-02 (×2): 10 mg via INTRAVENOUS

## 2015-09-02 MED ORDER — DOCUSATE SODIUM 100 MG PO CAPS
200.0000 mg | ORAL_CAPSULE | Freq: Every day | ORAL | Status: DC
  Administered 2015-09-03 – 2015-09-07 (×5): 200 mg via ORAL
  Filled 2015-09-02 (×5): qty 2

## 2015-09-02 MED ORDER — VANCOMYCIN HCL IN DEXTROSE 1-5 GM/200ML-% IV SOLN
1000.0000 mg | Freq: Once | INTRAVENOUS | Status: AC
  Administered 2015-09-02: 1000 mg via INTRAVENOUS
  Filled 2015-09-02: qty 200

## 2015-09-02 MED ORDER — POTASSIUM CHLORIDE 10 MEQ/50ML IV SOLN: 10.0000 meq | INTRAVENOUS | Status: AC

## 2015-09-02 MED ORDER — LACTATED RINGERS IV SOLN: INTRAVENOUS | Status: DC

## 2015-09-02 MED ORDER — SODIUM CHLORIDE 0.9 % IJ SOLN
INTRAMUSCULAR | Status: AC
  Filled 2015-09-02: qty 10

## 2015-09-02 MED ORDER — ACETAMINOPHEN 160 MG/5ML PO SOLN: 1000.0000 mg | Freq: Four times a day (QID) | ORAL | Status: DC

## 2015-09-02 MED ORDER — METOPROLOL TARTRATE 25 MG/10 ML ORAL SUSPENSION
12.5000 mg | Freq: Two times a day (BID) | ORAL | Status: DC
  Filled 2015-09-02 (×3): qty 5

## 2015-09-02 MED ORDER — INSULIN REGULAR BOLUS VIA INFUSION
0.0000 [IU] | Freq: Three times a day (TID) | INTRAVENOUS | Status: DC
  Filled 2015-09-02: qty 10

## 2015-09-02 MED ORDER — ANTISEPTIC ORAL RINSE SOLUTION (CORINZ)
7.0000 mL | Freq: Four times a day (QID) | OROMUCOSAL | Status: DC
  Administered 2015-09-03 – 2015-09-06 (×10): 7 mL via OROMUCOSAL

## 2015-09-02 MED ORDER — HEPARIN SODIUM (PORCINE) 1000 UNIT/ML IJ SOLN
INTRAMUSCULAR | Status: AC
  Filled 2015-09-02: qty 1

## 2015-09-02 MED ORDER — ASPIRIN EC 325 MG PO TBEC
325.0000 mg | DELAYED_RELEASE_TABLET | Freq: Every day | ORAL | Status: DC
  Administered 2015-09-03: 325 mg via ORAL
  Filled 2015-09-02 (×2): qty 1

## 2015-09-02 MED ORDER — ROCURONIUM BROMIDE 100 MG/10ML IV SOLN
INTRAVENOUS | Status: DC | PRN
  Administered 2015-09-02 (×3): 50 mg via INTRAVENOUS
  Administered 2015-09-02: 80 mg via INTRAVENOUS
  Administered 2015-09-02: 20 mg via INTRAVENOUS

## 2015-09-02 MED ORDER — MIDAZOLAM HCL 5 MG/ML IJ SOLN
INTRAMUSCULAR | Status: DC | PRN
  Administered 2015-09-02 (×3): 2 mg via INTRAVENOUS
  Administered 2015-09-02: 3 mg via INTRAVENOUS
  Administered 2015-09-02: 1 mg via INTRAVENOUS

## 2015-09-02 MED ORDER — EPHEDRINE SULFATE 50 MG/ML IJ SOLN
INTRAMUSCULAR | Status: AC
  Filled 2015-09-02: qty 1

## 2015-09-02 MED ORDER — SODIUM CHLORIDE 0.9 % IV SOLN
INTRAVENOUS | Status: DC
  Administered 2015-09-02: 18:00:00 via INTRAVENOUS
  Filled 2015-09-02 (×2): qty 2.5

## 2015-09-02 MED ORDER — OXYCODONE HCL 5 MG PO TABS
5.0000 mg | ORAL_TABLET | ORAL | Status: DC | PRN
  Administered 2015-09-02 – 2015-09-07 (×16): 10 mg via ORAL
  Filled 2015-09-02 (×16): qty 2

## 2015-09-02 MED ORDER — HEMOSTATIC AGENTS (NO CHARGE) OPTIME
TOPICAL | Status: DC | PRN
  Administered 2015-09-02: 1 via TOPICAL

## 2015-09-02 MED ORDER — PHENYLEPHRINE HCL 10 MG/ML IJ SOLN
0.0000 ug/min | INTRAVENOUS | Status: DC
  Filled 2015-09-02: qty 2

## 2015-09-02 MED ORDER — ARTIFICIAL TEARS OP OINT
TOPICAL_OINTMENT | OPHTHALMIC | Status: DC | PRN
  Administered 2015-09-02: 1 via OPHTHALMIC

## 2015-09-02 MED ORDER — MIDAZOLAM HCL 2 MG/2ML IJ SOLN: 2.0000 mg | INTRAMUSCULAR | Status: DC | PRN

## 2015-09-02 MED ORDER — FENTANYL CITRATE (PF) 250 MCG/5ML IJ SOLN
INTRAMUSCULAR | Status: DC | PRN
  Administered 2015-09-02: 100 ug via INTRAVENOUS
  Administered 2015-09-02: 250 ug via INTRAVENOUS
  Administered 2015-09-02: 150 ug via INTRAVENOUS
  Administered 2015-09-02 (×2): 100 ug via INTRAVENOUS
  Administered 2015-09-02: 200 ug via INTRAVENOUS
  Administered 2015-09-02 (×2): 100 ug via INTRAVENOUS
  Administered 2015-09-02: 250 ug via INTRAVENOUS

## 2015-09-02 MED ORDER — SODIUM CHLORIDE 0.9 % IJ SOLN: 3.0000 mL | INTRAMUSCULAR | Status: DC | PRN

## 2015-09-02 MED ORDER — PANTOPRAZOLE SODIUM 40 MG PO TBEC
40.0000 mg | DELAYED_RELEASE_TABLET | Freq: Every day | ORAL | Status: DC
  Administered 2015-09-03: 40 mg via ORAL
  Filled 2015-09-02 (×3): qty 1

## 2015-09-02 MED ORDER — TRAMADOL HCL 50 MG PO TABS
50.0000 mg | ORAL_TABLET | ORAL | Status: DC | PRN
  Administered 2015-09-03 – 2015-09-04 (×3): 100 mg via ORAL
  Administered 2015-09-06 (×2): 50 mg via ORAL
  Administered 2015-09-07: 100 mg via ORAL
  Filled 2015-09-02 (×4): qty 2
  Filled 2015-09-02 (×2): qty 1

## 2015-09-02 MED ORDER — ACETAMINOPHEN 500 MG PO TABS
1000.0000 mg | ORAL_TABLET | Freq: Four times a day (QID) | ORAL | Status: DC
  Administered 2015-09-02 – 2015-09-07 (×15): 1000 mg via ORAL
  Filled 2015-09-02 (×21): qty 2

## 2015-09-02 MED ORDER — ALBUMIN HUMAN 5 % IV SOLN
250.0000 mL | INTRAVENOUS | Status: AC | PRN
  Administered 2015-09-02 (×4): 250 mL via INTRAVENOUS
  Filled 2015-09-02: qty 250

## 2015-09-02 MED ORDER — STUDY - INVESTIGATIONAL DRUG SIMPLE RECORD
0.1000 ug/kg/min | Status: DC
Start: 2015-09-02 — End: 2015-09-02
  Administered 2015-09-02: 0.1 ug/kg/min via INTRAVENOUS
  Filled 2015-09-02: qty 0.01

## 2015-09-02 MED ORDER — FAMOTIDINE IN NACL 20-0.9 MG/50ML-% IV SOLN
20.0000 mg | Freq: Two times a day (BID) | INTRAVENOUS | Status: DC
  Administered 2015-09-02: 20 mg via INTRAVENOUS

## 2015-09-02 MED ORDER — METOPROLOL TARTRATE 12.5 MG HALF TABLET
12.5000 mg | ORAL_TABLET | Freq: Two times a day (BID) | ORAL | Status: DC
  Filled 2015-09-02 (×5): qty 1

## 2015-09-02 MED ORDER — MORPHINE SULFATE (PF) 2 MG/ML IV SOLN
2.0000 mg | INTRAVENOUS | Status: DC | PRN
  Administered 2015-09-02 (×2): 2 mg via INTRAVENOUS
  Administered 2015-09-02: 4 mg via INTRAVENOUS
  Administered 2015-09-02: 2 mg via INTRAVENOUS
  Administered 2015-09-03 (×4): 4 mg via INTRAVENOUS
  Filled 2015-09-02: qty 2
  Filled 2015-09-02: qty 1
  Filled 2015-09-02: qty 2
  Filled 2015-09-02: qty 1
  Filled 2015-09-02 (×2): qty 2
  Filled 2015-09-02 (×2): qty 1
  Filled 2015-09-02: qty 2

## 2015-09-02 MED ORDER — PHENYLEPHRINE HCL 10 MG/ML IJ SOLN
10.0000 mg | INTRAMUSCULAR | Status: DC | PRN
  Administered 2015-09-02: 20 ug/min via INTRAVENOUS

## 2015-09-02 MED ORDER — SODIUM CHLORIDE 0.9 % IV SOLN
INTRAVENOUS | Status: AC
  Administered 2015-09-02: 15:00:00 via INTRAVENOUS

## 2015-09-02 MED ORDER — LIDOCAINE HCL (CARDIAC) 20 MG/ML IV SOLN
INTRAVENOUS | Status: DC | PRN
  Administered 2015-09-02: 100 mg via INTRAVENOUS

## 2015-09-02 MED ORDER — STUDY - INVESTIGATIONAL DRUG SIMPLE RECORD
0.2000 ug/kg/min | Status: DC
  Administered 2015-09-02: 0.2 ug/kg/min via INTRAVENOUS
  Filled 2015-09-02: qty 0.01

## 2015-09-02 MED ORDER — LACTATED RINGERS IV SOLN: 500.0000 mL | Freq: Once | INTRAVENOUS | Status: DC | PRN

## 2015-09-02 MED ORDER — 0.9 % SODIUM CHLORIDE (POUR BTL) OPTIME
TOPICAL | Status: DC | PRN
  Administered 2015-09-02: 6000 mL

## 2015-09-02 MED FILL — Heparin Sodium (Porcine) Inj 1000 Unit/ML: INTRAMUSCULAR | Qty: 10 | Status: AC

## 2015-09-02 MED FILL — Mannitol IV Soln 20%: INTRAVENOUS | Qty: 500 | Status: AC

## 2015-09-02 MED FILL — Sodium Bicarbonate IV Soln 8.4%: INTRAVENOUS | Qty: 50 | Status: AC

## 2015-09-02 MED FILL — Sodium Chloride IV Soln 0.9%: INTRAVENOUS | Qty: 2000 | Status: AC

## 2015-09-02 MED FILL — Lidocaine HCl IV Inj 20 MG/ML: INTRAVENOUS | Qty: 5 | Status: AC

## 2015-09-02 MED FILL — Electrolyte-R (PH 7.4) Solution: INTRAVENOUS | Qty: 3000 | Status: AC

## 2015-09-02 SURGICAL SUPPLY — 118 items
ADH SKN CLS APL DERMABOND .7 (GAUZE/BANDAGES/DRESSINGS) ×2
APL SKNCLS STERI-STRIP NONHPOA (GAUZE/BANDAGES/DRESSINGS) ×2
BAG DECANTER FOR FLEXI CONT (MISCELLANEOUS) ×6 IMPLANT
BANDAGE ELASTIC 4 VELCRO ST LF (GAUZE/BANDAGES/DRESSINGS) ×3 IMPLANT
BANDAGE ELASTIC 6 VELCRO ST LF (GAUZE/BANDAGES/DRESSINGS) ×3 IMPLANT
BASKET HEART (ORDER IN 25'S) (MISCELLANEOUS) ×1
BASKET HEART (ORDER IN 25S) (MISCELLANEOUS) ×2 IMPLANT
BENZOIN TINCTURE PRP APPL 2/3 (GAUZE/BANDAGES/DRESSINGS) ×1 IMPLANT
BLADE STERNUM SYSTEM 6 (BLADE) ×3 IMPLANT
BLADE SURG 11 STRL SS (BLADE) ×1 IMPLANT
BLADE SURG ROTATE 9660 (MISCELLANEOUS) IMPLANT
BNDG GAUZE ELAST 4 BULKY (GAUZE/BANDAGES/DRESSINGS) ×3 IMPLANT
CANISTER SUCTION 2500CC (MISCELLANEOUS) ×3 IMPLANT
CANNULA EZ GLIDE AORTIC 21FR (CANNULA) ×6 IMPLANT
CANNULA FEM VENOUS REMOTE 22FR (CANNULA) ×1 IMPLANT
CATH CPB KIT OWEN (MISCELLANEOUS) ×3 IMPLANT
CATH THORACIC 28FR (CATHETERS) ×1 IMPLANT
CATH THORACIC 36FR (CATHETERS) ×3 IMPLANT
CLIP TI MEDIUM 24 (CLIP) IMPLANT
CLIP TI WIDE RED SMALL 24 (CLIP) IMPLANT
CONN ST 1/4X3/8  BEN (MISCELLANEOUS) ×2
CONN ST 1/4X3/8 BEN (MISCELLANEOUS) IMPLANT
CRADLE DONUT ADULT HEAD (MISCELLANEOUS) ×3 IMPLANT
DERMABOND ADVANCED (GAUZE/BANDAGES/DRESSINGS) ×1
DERMABOND ADVANCED .7 DNX12 (GAUZE/BANDAGES/DRESSINGS) IMPLANT
DRAIN CHANNEL 32F RND 10.7 FF (WOUND CARE) ×6 IMPLANT
DRAPE CARDIOVASCULAR INCISE (DRAPES) ×3
DRAPE INCISE IOBAN 66X45 STRL (DRAPES) ×3 IMPLANT
DRAPE SLUSH/WARMER DISC (DRAPES) ×3 IMPLANT
DRAPE SRG 135X102X78XABS (DRAPES) ×2 IMPLANT
DRSG COVADERM 4X14 (GAUZE/BANDAGES/DRESSINGS) ×3 IMPLANT
DRSG KUZMA FLUFF (GAUZE/BANDAGES/DRESSINGS) ×1 IMPLANT
ELECT REM PT RETURN 9FT ADLT (ELECTROSURGICAL) ×6
ELECTRODE REM PT RTRN 9FT ADLT (ELECTROSURGICAL) ×4 IMPLANT
GAUZE SPONGE 4X4 12PLY STRL (GAUZE/BANDAGES/DRESSINGS) ×6 IMPLANT
GLOVE BIO SURGEON STRL SZ 6 (GLOVE) ×2 IMPLANT
GLOVE BIOGEL PI IND STRL 6 (GLOVE) IMPLANT
GLOVE BIOGEL PI IND STRL 6.5 (GLOVE) IMPLANT
GLOVE BIOGEL PI INDICATOR 6 (GLOVE) ×3
GLOVE BIOGEL PI INDICATOR 6.5 (GLOVE) ×3
GLOVE ORTHO TXT STRL SZ7.5 (GLOVE) ×6 IMPLANT
GOWN STRL REUS W/ TWL LRG LVL3 (GOWN DISPOSABLE) ×8 IMPLANT
GOWN STRL REUS W/TWL LRG LVL3 (GOWN DISPOSABLE) ×12
HEMOSTAT POWDER SURGIFOAM 1G (HEMOSTASIS) ×9 IMPLANT
INSERT FOGARTY XLG (MISCELLANEOUS) ×3 IMPLANT
KIT BASIN OR (CUSTOM PROCEDURE TRAY) ×3 IMPLANT
KIT DILATOR VASC 18G NDL (KITS) ×2 IMPLANT
KIT DRAINAGE VACCUM ASSIST (KITS) ×1 IMPLANT
KIT ROOM TURNOVER OR (KITS) ×3 IMPLANT
KIT SUCTION CATH 14FR (SUCTIONS) ×9 IMPLANT
KIT VASOVIEW W/TROCAR VH 2000 (KITS) ×3 IMPLANT
LEAD PACING MYOCARDI (MISCELLANEOUS) ×3 IMPLANT
MARKER GRAFT CORONARY BYPASS (MISCELLANEOUS) ×9 IMPLANT
NS IRRIG 1000ML POUR BTL (IV SOLUTION) ×15 IMPLANT
PACK OPEN HEART (CUSTOM PROCEDURE TRAY) ×3 IMPLANT
PAD ARMBOARD 7.5X6 YLW CONV (MISCELLANEOUS) ×6 IMPLANT
PAD ELECT DEFIB RADIOL ZOLL (MISCELLANEOUS) ×3 IMPLANT
PENCIL BUTTON HOLSTER BLD 10FT (ELECTRODE) ×3 IMPLANT
PUNCH AORTIC ROT 4.0MM RCL 40 (MISCELLANEOUS) ×1 IMPLANT
PUNCH AORTIC ROTATE 4.0MM (MISCELLANEOUS) IMPLANT
PUNCH AORTIC ROTATE 4.5MM 8IN (MISCELLANEOUS) IMPLANT
PUNCH AORTIC ROTATE 5MM 8IN (MISCELLANEOUS) IMPLANT
SET CARDIOPLEGIA MPS 5001102 (MISCELLANEOUS) ×1 IMPLANT
SOLUTION ANTI FOG 6CC (MISCELLANEOUS) IMPLANT
SPONGE GAUZE 4X4 12PLY STER LF (GAUZE/BANDAGES/DRESSINGS) ×2 IMPLANT
SPONGE LAP 4X18 X RAY DECT (DISPOSABLE) ×1 IMPLANT
STRIP CLOSURE SKIN 1/2X4 (GAUZE/BANDAGES/DRESSINGS) ×1 IMPLANT
SUT BONE WAX W31G (SUTURE) ×3 IMPLANT
SUT ETHIBOND 2 0 SH (SUTURE) ×12
SUT ETHIBOND 2 0 SH 36X2 (SUTURE) IMPLANT
SUT ETHIBOND X763 2 0 SH 1 (SUTURE) ×6 IMPLANT
SUT MNCRL AB 3-0 PS2 18 (SUTURE) ×6 IMPLANT
SUT MNCRL AB 4-0 PS2 18 (SUTURE) ×1 IMPLANT
SUT PDS AB 1 CTX 36 (SUTURE) ×6 IMPLANT
SUT PROLENE 2 0 SH DA (SUTURE) IMPLANT
SUT PROLENE 3 0 SH DA (SUTURE) ×4 IMPLANT
SUT PROLENE 3 0 SH1 36 (SUTURE) IMPLANT
SUT PROLENE 4 0 RB 1 (SUTURE) ×3
SUT PROLENE 4 0 SH DA (SUTURE) IMPLANT
SUT PROLENE 4-0 RB1 .5 CRCL 36 (SUTURE) IMPLANT
SUT PROLENE 5 0 C 1 36 (SUTURE) ×2 IMPLANT
SUT PROLENE 6 0 C 1 30 (SUTURE) ×4 IMPLANT
SUT PROLENE 7.0 RB 3 (SUTURE) ×10 IMPLANT
SUT PROLENE 8 0 BV175 6 (SUTURE) ×4 IMPLANT
SUT PROLENE BLUE 7 0 (SUTURE) ×4 IMPLANT
SUT PROLENE POLY MONO (SUTURE) ×1 IMPLANT
SUT SILK  1 MH (SUTURE) ×5
SUT SILK 1 MH (SUTURE) ×2 IMPLANT
SUT SILK 1 TIES 10X30 (SUTURE) ×1 IMPLANT
SUT SILK 2 0 SH CR/8 (SUTURE) ×2 IMPLANT
SUT SILK 2 0 TIES 10X30 (SUTURE) ×1 IMPLANT
SUT SILK 2 0 TIES 17X18 (SUTURE) ×3
SUT SILK 2-0 18XBRD TIE BLK (SUTURE) IMPLANT
SUT SILK 3 0 SH CR/8 (SUTURE) ×1 IMPLANT
SUT SILK 4 0 TIE 10X30 (SUTURE) ×2 IMPLANT
SUT STEEL 6MS V (SUTURE) IMPLANT
SUT STEEL STERNAL CCS#1 18IN (SUTURE) IMPLANT
SUT STEEL SZ 6 DBL 3X14 BALL (SUTURE) IMPLANT
SUT TEM PAC WIRE 2 0 SH (SUTURE) ×4 IMPLANT
SUT VIC AB 1 CTX 36 (SUTURE)
SUT VIC AB 1 CTX36XBRD ANBCTR (SUTURE) IMPLANT
SUT VIC AB 2-0 CT1 27 (SUTURE) ×3
SUT VIC AB 2-0 CT1 TAPERPNT 27 (SUTURE) IMPLANT
SUT VIC AB 2-0 CTX 27 (SUTURE) ×2 IMPLANT
SUT VIC AB 3-0 SH 27 (SUTURE)
SUT VIC AB 3-0 SH 27X BRD (SUTURE) IMPLANT
SUT VIC AB 3-0 X1 27 (SUTURE) ×2 IMPLANT
SUT VICRYL 4-0 PS2 18IN ABS (SUTURE) IMPLANT
SUTURE E-PAK OPEN HEART (SUTURE) ×3 IMPLANT
SYSTEM SAHARA CHEST DRAIN ATS (WOUND CARE) ×4 IMPLANT
TAPE CLOTH SURG 4X10 WHT LF (GAUZE/BANDAGES/DRESSINGS) ×2 IMPLANT
TAPE PAPER 2X10 WHT MICROPORE (GAUZE/BANDAGES/DRESSINGS) ×1 IMPLANT
TOWEL OR 17X24 6PK STRL BLUE (TOWEL DISPOSABLE) ×6 IMPLANT
TOWEL OR 17X26 10 PK STRL BLUE (TOWEL DISPOSABLE) ×6 IMPLANT
TRAY FOLEY IC TEMP SENS 16FR (CATHETERS) ×3 IMPLANT
TUBING INSUFFLATION (TUBING) ×3 IMPLANT
UNDERPAD 30X30 INCONTINENT (UNDERPADS AND DIAPERS) ×3 IMPLANT
WATER STERILE IRR 1000ML POUR (IV SOLUTION) ×6 IMPLANT

## 2015-09-02 NOTE — Progress Notes (Signed)
Initiated Open Heart Rapid Wean Protocol. RT will continue to monitor.

## 2015-09-02 NOTE — OR Nursing (Signed)
45 minute call to SICU charge nurse at 1220.

## 2015-09-02 NOTE — Op Note (Signed)
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 Roxy Manns, MD  Assistant: Arlyn Leak. 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 The patient's cardiac history dates back to 2000 when he presented with angina pectoris. He underwent PCI and stenting of the right coronary artery at that time and apparently was subsequently lost to follow-up. In January 2015 he suffered a massive ST segment elevation myocardial infarction with occlusion of the proximal left anterior descending coronary artery. He was noted to have occlusion of the right coronary artery at that time that was felt to be chronic. He was treated with PCI and stenting of the  left anterior descending coronary artery with drug-eluting stents. He subsequently underwent ICD implantation for primary prophylaxis because of ejection fraction estimated 30-35%. In December 2015 he underwent follow-up stress my view because of symptoms of exertional shortness of breath and chest pain. Diagnostic cardiac catheterization revealed widely patent stents in the left anterior descending coronary artery. There remained chronic occlusion of the right coronary artery. He subsequently underwent an attempt at PCI of his chronic total occlusion of the right coronary artery on 02/03/2015. This was complicated by perforation of mid right coronary artery with cardiac tamponade. He was treated with pericardiocentesis. Perforation of the coronary artery was sealed using a covered stent in the mid right coronary artery. An additional 4 drug eluding stents were placed in the RCA at that time. He recovered without need for open surgical intervention. In July the patient was hospitalized with an acute non-ST segment elevation myocardial infarction. Diagnostic cardiac catheterization performed at that time demonstrated chronic occlusion of the right coronary artery with 50% stenosis of the distal left main coronary artery. He was treated medically. The patient was recently seen in follow-up and continued to complain of fatigue, exertional shortness of breath, and intermittent chest discomfort. Repeat diagnostic cardiac catheterization was performed revealing similar findings with 50% stenosis of the distal left main coronary artery and chronic occlusion of right coronary artery. Fractional flow wire analysis was performed of the left main coronary stenosis, confirming the presence of hemodynamically significant disease. The patient was referred for surgical consultation.  The patient has been seen in consultation and counseled at length regarding the indications, risks and potential benefits of surgery.  All  questions have been answered, and the patient provides full informed consent for the operation as described.  Prior to  surgery the patient consented to participate in the LEVO-CTS study.    DETAILS OF THE OPERATIVE PROCEDURE  Preparation:  The patient is brought to the operating room on the above mentioned date and central monitoring was established by the anesthesia team including placement of Swan-Ganz catheter and radial arterial line. The patient is placed in the supine position on the operating table.  Intravenous antibiotics are administered. General endotracheal anesthesia is induced uneventfully. A Foley catheter is placed.  Baseline transesophageal echocardiogram was performed.  Findings were notable for moderate-severe LV systolic dysfunction with akinesis of the distal anterior wall and apex with global hypokinesis.  There was mild mitral regurgitation.  No other significant abnormalities were noted.  The patient's chest, abdomen, both groins, and both lower extremities are prepared and draped in a sterile manner. A time out procedure is performed.   Surgical Approach and Conduit Harvest:  A median sternotomy incision was performed and the left internal mammary artery is dissected from the chest wall and prepared for bypass grafting. The left internal mammary artery is notably good quality conduit. Both lungs were hyperinflated and had gross anatomical findings consistent with COPD.  Simultaneously, the greater saphenous vein is obtained from the patient's right thigh using endoscopic vein harvest technique. The saphenous vein is notably good quality conduit. After removal of the saphenous vein, the small surgical incisions in the lower extremity are closed with absorbable suture. Following systemic heparinization, the left internal mammary artery was transected distally noted to have excellent flow.   Extracorporeal Cardiopulmonary Bypass and Myocardial Protection:  The pericardium  is opened. There is severe chronic inflammation with dense adhesions throughout the entire pericardial space.  Sharp dissection and electrocautery is utilized to free up the surface of the ascending aorta and the right atrium from adjacent structures.  The ascending aorta is normal in appearance. The ascending aorta and the right atrium are cannulated for cardiopulmonary bypass.  Adequate heparinization is verified.    The entire pre-bypass portion of the operation was notable for stable hemodynamics.  Cardiopulmonary bypass was begun.  A cardioplegia cannula is placed in the ascending aorta.  A temperature probe was placed in the interventricular septum.  The patient is allowed to cool passively to [facility] systemic temperature.  The aortic cross clamp is applied and cold blood cardioplegia is delivered initially in an antegrade fashion through the aortic root.   Iced saline slush is applied for topical hypothermia.  The initial cardioplegic arrest is rapid with early diastolic arrest.  Repeat doses of cardioplegia are administered intermittently throughout the entire cross clamp portion of the operation through the aortic root and through subsequently placed vein grafts in order to maintain completely flat electrocardiogram and septal myocardial temperature below 15C.  Myocardial protection was felt to be excellent.  Sharp dissection is utilized to free up the epicardial surface of the heart circumferentially.  This is quite tedious due to the extensive amount of adhesions and chronic inflammation. Distal target vessels are selected for coronary artery bypass grafting.  The terminal branches of the distal right coronary artery are too small and diffusely diseased for grafting.      Coronary Artery Bypass Grafting:   The ramus intermediate branch coronary artery was grafted using a reversed saphenous vein graft in an end-to-side fashion.  At the site of distal anastomosis the target vessel was  intramyocardial and fair quality with diffuse plaque.  It measured approximately 2.0 mm in diameter.  The distal left anterior coronary artery was grafted  with the left internal mammary artery in an end-to-side fashion.  At the site of distal anastomosis the target vessel was diffusely diseased and fair to good quality.  It measured approximately 2.0 mm in diameter.  All proximal vein graft anastomoses were placed directly to the ascending aorta prior to removal of the aortic cross clamp.  The septal myocardial temperature rose rapidly after reperfusion of the left internal mammary artery graft.  The aortic cross clamp was removed after a total cross clamp time of 75 minutes.   Procedure Completion:  All proximal and distal coronary anastomoses were inspected for hemostasis and appropriate graft orientation. Epicardial pacing wires are fixed to the right ventricular outflow tract and to the right atrial appendage. The patient is rewarmed to 37C temperature. The patient is weaned and disconnected from cardiopulmonary bypass.  The patient's rhythm at separation from bypass was sinus.  The patient was weaned from cardiopulmonary bypass without any inotropic support other than the LEVO-CTS study drug. Total cardiopulmonary bypass time for the operation was 93 minutes.  Followup transesophageal echocardiogram performed after separation from bypass revealed no changes from the preoperative exam.  The aortic and venous cannula were removed uneventfully. Protamine was administered to reverse the anticoagulation. The mediastinum and pleural space were inspected for hemostasis and irrigated with saline solution. The mediastinum and both pleural spaces were drained using 4 chest tubes placed through separate stab incisions inferiorly.  The soft tissues anterior to the aorta were reapproximated loosely. The sternum is closed with double strength sternal wire. The soft tissues anterior to the sternum were closed in  multiple layers and the skin is closed with a running subcuticular skin closure.  The post-bypass portion of the operation was notable for stable rhythm and hemodynamics.  However, norepinephrine infusion was added due to vasodilatation on the LEVO-CTS study drug.  No blood products were administered during the operation.   Disposition:  The patient tolerated the procedure well and is transported to the surgical intensive care in stable condition. There are no intraoperative complications. All sponge instrument and needle counts are verified correct at completion of the operation.    Valentina Gu. Roxy Manns MD 09/02/2015 12:56 PM

## 2015-09-02 NOTE — Procedures (Signed)
Extubation Procedure Note  Patient Details:   Name: Kerry Mason DOB: December 17, 1955 MRN: [id number]   Airway Documentation:  Airway 8 mm (Active)  Secured at (cm) 23 cm 09/02/2015  1:25 PM  Measured From Lips 09/02/2015  1:25 PM  Secured Location Right 09/02/2015  1:25 PM  Secured By Pink Tape 09/02/2015  1:25 PM  Site Condition Dry 09/02/2015  1:25 PM    Evaluation  O2 sats: stable throughout Complications: No apparent complications Patient did tolerate procedure well. Bilateral Breath Sounds: Clear, Diminished Suctioning: Oral, Airway Yes   Pt extubated to 4L [place] based on current ABG. VC 0.900, NIF -25, ABG within normal limits. Pt able to speak name and has weak cough. Encouraged to use Mudlogger. Pt with diminished bilateral BS. RT will continue to monitor.   Jesse Sans 09/02/2015, 5:57 PM

## 2015-09-02 NOTE — Progress Notes (Signed)
Patient ID: Kerry Mason, male   DOB: [birth year], 59 y.o.   MRN: [id number] EVENING ROUNDS NOTE :     [address]       [place] [employer],[place] [address]             [phone]                 Day of Surgery Procedure(s) (LRB): CORONARY ARTERY BYPASS GRAFTING (CABG) x 2 (LIMA-LAD, SVG-Intermediate) ENDOSCOPIC GREATER SAPHENOUS VEIN HARVEST RIGHT THIGH (N/A) TRANSESOPHAGEAL ECHOCARDIOGRAM (TEE) (N/A)  Total Length of Stay:  LOS: 0 days  BP 99/65 mmHg  Pulse 92  Temp(Src) 98.4 F (36.9 C) (Core (Comment))  Resp 25  Wt 208 lb (94.348 kg)  SpO2 95%  .Intake/Output      09/29 0701 - 09/30 0700   I.V. (mL/kg) 2628.8 (27.9)   Blood 960   NG/GT 30   IV Piggyback 1697   Total Intake(mL/kg) 5315.8 (56.3)   Urine (mL/kg/hr) 2535 (2)   Blood 1800 (1.4)   Chest Tube 390 (0.3)   Total Output 4725   Net +590.8         . sodium chloride 10 mL/hr at 09/02/15 1900  . [START ON 09/03/2015] sodium chloride    . sodium chloride Stopped (09/02/15 1330)  . sodium chloride 100 mL/hr at 09/02/15 1900  . dexmedetomidine Stopped (09/02/15 1630)  . insulin (NOVOLIN-R) infusion 1.8 Units/hr (09/02/15 1900)  . lactated ringers 10 mL/hr at 09/02/15 1900  . lactated ringers Stopped (09/02/15 1330)  . nitroGLYCERIN Stopped (09/02/15 1330)  . norepinephrine (LEVOPHED) Adult infusion 4 mcg/min (09/02/15 1900)  . phenylephrine (NEO-SYNEPHRINE) Adult infusion Stopped (09/02/15 1330)  . research study medication 0.044 mcg/kg/min (09/02/15 1500)     Lab Results  Component Value Date   WBC 23.6* 09/02/2015   HGB 10.5* 09/02/2015   HCT 31.0* 09/02/2015   PLT 168 09/02/2015   GLUCOSE 129* 09/02/2015   CHOL 91 06/14/2015   TRIG 77 06/14/2015   HDL 26* 06/14/2015   LDLCALC 50 06/14/2015   ALT 12* 08/31/2015   AST 19 08/31/2015   NA 139 09/02/2015   K 4.4 09/02/2015   CL 105 09/02/2015   CREATININE 0.80 09/02/2015   BUN 15 09/02/2015   CO2 24 09/02/2015   TSH 1.265 12/11/2013   INR  1.41 09/02/2015   HGBA1C 6.0* 08/31/2015   Extubated, neuro intact Not bleeding  Grace Isaac MD  Beeper [phone] Office [phone] 09/02/2015 8:28 PM

## 2015-09-02 NOTE — OR Nursing (Signed)
Off pump call to SICU charge nurse at 1143.

## 2015-09-02 NOTE — OR Nursing (Signed)
20 minute call to SICU charge nurse at  1250.

## 2015-09-02 NOTE — OR Nursing (Signed)
On-way call to SICU charge nurse at 1324.

## 2015-09-02 NOTE — Anesthesia Procedure Notes (Addendum)
Anesthesia Procedure Note PA catheter:  Routine monitors. Timeout, sterile prep, drape, FBP L neck.  Trendelenburg position.  1% [place] local, finder and trocar LIJ 1st pass with US guidance.  Cordis placed over J wire. PA catheter in easily.  Sterile dressing applied.  Patient tolerated well, VSS.  Jenita Seashore, MD  06:55-07:13 Procedure Name: Intubation Date/Time: 09/02/2015 8:00 AM Performed by: Julian Reil Pre-anesthesia Checklist: Patient identified, Emergency Drugs available, Suction available, Patient being monitored and Timeout performed Patient Re-evaluated:Patient Re-evaluated prior to inductionOxygen Delivery Method: Circle system utilized Preoxygenation: Pre-oxygenation with 100% oxygen Intubation Type: IV induction Ventilation: Oral airway inserted - appropriate to patient size and Mask ventilation without difficulty Laryngoscope Size: Mac and 4 Grade View: Grade I Tube type: Oral Tube size: 8.0 mm Number of attempts: 1 Airway Equipment and Method: Stylet Placement Confirmation: ETT inserted through vocal cords under direct vision,  breath sounds checked- equal and bilateral and positive ETCO2 Secured at: 24 cm Tube secured with: Tape Dental Injury: Teeth and Oropharynx as per pre-operative assessment

## 2015-09-02 NOTE — Anesthesia Preprocedure Evaluation (Addendum)
Anesthesia Evaluation  Patient identified by MRN, date of birth, ID band Patient awake    Reviewed: Allergy & Precautions, H&P , NPO status , Patient's Chart, lab work & pertinent test results  History of Anesthesia Complications Negative for: history of anesthetic complications  Airway Mallampati: II  TM Distance: >3 FB Neck ROM: full    Dental no notable dental hx.    Pulmonary former smoker,    Pulmonary exam normal breath sounds clear to auscultation       Cardiovascular hypertension, + angina at rest + CAD, + Past MI and +CHF  Normal cardiovascular exam+ Cardiac Defibrillator  Rhythm:regular Rate:Normal  Echo 08/24/2015 with EF 30-35%, actually improved from previous, mildly dilated RV but normal function, no anticipated valvular dysfunction   Neuro/Psych Anxiety negative neurological ROS     GI/Hepatic negative GI ROS, Neg liver ROS,   Endo/Other  negative endocrine ROS  Renal/GU negative Renal ROS     Musculoskeletal   Abdominal   Peds  Hematology negative hematology ROS (+)   Anesthesia Other Findings   Reproductive/Obstetrics negative OB ROS                           Anesthesia Physical Anesthesia Plan  ASA: IV  Anesthesia Plan: General   Post-op Pain Management:    Induction: Intravenous  Airway Management Planned: Oral ETT  Additional Equipment: TEE, Arterial line, CVP, PA Cath and Ultrasound Guidance Line Placement  Intra-op Plan:   Post-operative Plan: Possible Post-op intubation/ventilation  Informed Consent: I have reviewed the patients History and Physical, chart, labs and discussed the procedure including the risks, benefits and alternatives for the proposed anesthesia with the patient or authorized representative who has indicated his/her understanding and acceptance.   Dental Advisory Given  Plan Discussed with: Anesthesiologist, CRNA and  Surgeon  Anesthesia Plan Comments: (Patient with long ischemic cardiac history with multiple stents primary LAD and RCx here for CABG with Dr. Roxy Manns.  Rep needed to disable AICD which is in place for chronic systolic HF arrythmia prophylaxis)        Anesthesia Quick Evaluation

## 2015-09-02 NOTE — Progress Notes (Signed)
  Echocardiogram Echocardiogram Transesophageal has been performed.  Jennette Dubin 09/02/2015, 8:45 AM

## 2015-09-02 NOTE — Interval H&P Note (Signed)
History and Physical Interval Note:  09/02/2015 6:49 AM  Kerry Mason  has presented today for surgery, with the diagnosis of LEFT MAIN DISEASE  The various methods of treatment have been discussed with the patient and family. After consideration of risks, benefits and other options for treatment, the patient has consented to  Procedure(s): CORONARY ARTERY BYPASS GRAFTING (CABG) (N/A) TRANSESOPHAGEAL ECHOCARDIOGRAM (TEE) (N/A) as a surgical intervention .  The patient's history has been reviewed, patient examined, no change in status, stable for surgery.  I have reviewed the patient's chart and labs.  Questions were answered to the patient's satisfaction.     Rexene Alberts

## 2015-09-02 NOTE — Anesthesia Postprocedure Evaluation (Signed)
  Anesthesia Post-op Note  Patient: Kerry Mason  Procedure(s) Performed: Procedure(s): CORONARY ARTERY BYPASS GRAFTING (CABG) x 2 (LIMA-LAD, SVG-Intermediate) ENDOSCOPIC GREATER SAPHENOUS VEIN HARVEST RIGHT THIGH (N/A) TRANSESOPHAGEAL ECHOCARDIOGRAM (TEE) (N/A)  Patient Location: ICU  Anesthesia Type:General  Level of Consciousness: sedated  Airway and Oxygen Therapy: Patient placed on Ventilator (see vital sign flow sheet for setting)  Post-op Pain: none  Post-op Assessment: Post-op Vital signs reviewed, Patient's Cardiovascular Status Stable, Respiratory Function Stable, Patent Airway, No signs of Nausea or vomiting and Pain level controlled              Post-op Vital Signs: Reviewed  Last Vitals:  Filed Vitals:   09/02/15 1545  BP:   Pulse: 58  Temp: 35.9 C  Resp: 20    Complications: No apparent anesthesia complications

## 2015-09-02 NOTE — Progress Notes (Signed)
Utilization Review Completed.Donne Anon T9/29/2016

## 2015-09-02 NOTE — Brief Op Note (Addendum)
09/02/2015  11:27 AM  PATIENT:  Issacc Berkey  59 y.o. male  PRE-OPERATIVE DIAGNOSIS:  CORONARY ARTERY DISEASE  POST-OPERATIVE DIAGNOSIS:  CORONARY ARTERY DISEASE  PROCEDURE:   CORONARY ARTERY BYPASS GRAFTING x 2 (LIMA-LAD, SVG-Intermediate) ENDOSCOPIC GREATER SAPHENOUS VEIN HARVEST RIGHT THIGH  SURGEON:    Rexene Alberts, MD  ASSISTANTS:  Coolidge Breeze, PA-C  ANESTHESIA:   Jillyn Hidden, MD  CROSSCLAMP TIME:   81'  CARDIOPULMONARY BYPASS TIME: 55'  FINDINGS:  Ischemic cardiomyopathy with global hypokinesis and akinesis of the distal anterior wall and apex  Severe chronic inflammation with dense adhesions throughout the pericardial space  Hyperinflation with gross findings c/w 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

## 2015-09-02 NOTE — Progress Notes (Signed)
Based on ABG post OR, increased pt's VT to 640 (8cc/kg), increased Rate to 15, and increased FiO2 to 60%. RN aware, RT will continue to monitor.

## 2015-09-02 NOTE — Transfer of Care (Signed)
Immediate Anesthesia Transfer of Care Note  Patient: Kerry Mason  Procedure(s) Performed: Procedure(s): CORONARY ARTERY BYPASS GRAFTING (CABG) x 2 (LIMA-LAD, SVG-Intermediate) ENDOSCOPIC GREATER SAPHENOUS VEIN HARVEST RIGHT THIGH (N/A) TRANSESOPHAGEAL ECHOCARDIOGRAM (TEE) (N/A)  Patient Location: PACU and SICU  Anesthesia Type:General  Level of Consciousness: sedated, unresponsive and Patient remains intubated per anesthesia plan  Airway & Oxygen Therapy: Patient remains intubated per anesthesia plan and Patient placed on Ventilator (see vital sign flow sheet for setting)  Post-op Assessment: Report given to RN and Post -op Vital signs reviewed and stable  Post vital signs: Reviewed and stable  Last Vitals:  Filed Vitals:   09/02/15 0559  BP: 116/72  Pulse: 82  Temp: 37 C  Resp: 18    Complications: No apparent anesthesia complications

## 2015-09-03 ENCOUNTER — Inpatient Hospital Stay (HOSPITAL_COMMUNITY): Payer: Medicaid Other

## 2015-09-03 ENCOUNTER — Encounter (HOSPITAL_COMMUNITY): Payer: Self-pay | Admitting: Thoracic Surgery (Cardiothoracic Vascular Surgery)

## 2015-09-03 LAB — BASIC METABOLIC PANEL
ANION GAP: 6 (ref 5–15)
BUN: 12 mg/dL (ref 6–20)
CALCIUM: 8 mg/dL — AB (ref 8.9–10.3)
CO2: 23 mmol/L (ref 22–32)
Chloride: 105 mmol/L (ref 101–111)
Creatinine, Ser: 0.78 mg/dL (ref 0.61–1.24)
GFR calc Af Amer: >60 mL/min (ref >60)
GLUCOSE: 87 mg/dL (ref 65–99)
POTASSIUM: 4.1 mmol/L (ref 3.5–5.1)
SODIUM: 134 mmol/L — AB (ref 135–145)

## 2015-09-03 LAB — CK TOTAL AND CKMB (NOT AT ARMC)
CK, MB: 14.4 ng/mL — AB (ref 0.5–5.0)
CK, MB: 11.8 ng/mL — ABNORMAL HIGH (ref 0.5–5.0)
Relative Index: 3.7 — ABNORMAL HIGH (ref 0.0–2.5)
Relative Index: 2.6 — ABNORMAL HIGH (ref 0.0–2.5)
Total CK: 387 U/L (ref 49–397)
Total CK: 453 U/L — ABNORMAL HIGH (ref 49–397)

## 2015-09-03 LAB — CBC
HCT: 28.2 % — ABNORMAL LOW (ref 39.0–52.0)
HCT: 29.6 % — ABNORMAL LOW (ref 39.0–52.0)
Hemoglobin: 9.5 g/dL — ABNORMAL LOW (ref 13.0–17.0)
Hemoglobin: 9.8 g/dL — ABNORMAL LOW (ref 13.0–17.0)
MCH: 30.3 pg (ref 26.0–34.0)
MCH: 29.5 pg (ref 26.0–34.0)
MCHC: 33.7 g/dL (ref 30.0–36.0)
MCHC: 33.1 g/dL (ref 30.0–36.0)
MCV: 89.8 fL (ref 78.0–100.0)
MCV: 89.2 fL (ref 78.0–100.0)
PLATELETS: 144 10*3/uL — AB (ref 150–400)
PLATELETS: 125 10*3/uL — AB (ref 150–400)
RBC: 3.14 MIL/uL — AB (ref 4.22–5.81)
RBC: 3.32 MIL/uL — AB (ref 4.22–5.81)
RDW: 16.3 % — AB (ref 11.5–15.5)
RDW: 16.1 % — AB (ref 11.5–15.5)
WBC: 16.7 10*3/uL — ABNORMAL HIGH (ref 4.0–10.5)
WBC: 16.4 10*3/uL — ABNORMAL HIGH (ref 4.0–10.5)

## 2015-09-03 LAB — MAGNESIUM
MAGNESIUM: 2.1 mg/dL (ref 1.7–2.4)
Magnesium: 1.9 mg/dL (ref 1.7–2.4)

## 2015-09-03 LAB — CREATININE, SERUM
CREATININE: 0.96 mg/dL (ref 0.61–1.24)
GFR calc Af Amer: >60 mL/min (ref >60)
GFR calc non Af Amer: >60 mL/min (ref >60)

## 2015-09-03 LAB — GLUCOSE, CAPILLARY
GLUCOSE-CAPILLARY: 120 mg/dL — AB (ref 65–99)
GLUCOSE-CAPILLARY: 124 mg/dL — AB (ref 65–99)
GLUCOSE-CAPILLARY: 146 mg/dL — AB (ref 65–99)
GLUCOSE-CAPILLARY: 113 mg/dL — AB (ref 65–99)
GLUCOSE-CAPILLARY: 95 mg/dL (ref 65–99)
Glucose-Capillary: 126 mg/dL — ABNORMAL HIGH (ref 65–99)
Glucose-Capillary: 139 mg/dL — ABNORMAL HIGH (ref 65–99)
Glucose-Capillary: 142 mg/dL — ABNORMAL HIGH (ref 65–99)
Glucose-Capillary: 103 mg/dL — ABNORMAL HIGH (ref 65–99)
Glucose-Capillary: 106 mg/dL — ABNORMAL HIGH (ref 65–99)
Glucose-Capillary: 114 mg/dL — ABNORMAL HIGH (ref 65–99)
Glucose-Capillary: 89 mg/dL (ref 65–99)
Glucose-Capillary: 106 mg/dL — ABNORMAL HIGH (ref 65–99)
Glucose-Capillary: 99 mg/dL (ref 65–99)

## 2015-09-03 LAB — TROPONIN I
TROPONIN I: 1.76 ng/mL — AB (ref <0.031)
Troponin I: 1.31 ng/mL (ref <0.031)

## 2015-09-03 MED ORDER — FUROSEMIDE 10 MG/ML IJ SOLN
20.0000 mg | Freq: Four times a day (QID) | INTRAMUSCULAR | Status: DC
  Administered 2015-09-03 – 2015-09-04 (×2): 20 mg via INTRAVENOUS
  Filled 2015-09-03 (×2): qty 2

## 2015-09-03 MED ORDER — KETOROLAC TROMETHAMINE 30 MG/ML IJ SOLN
30.0000 mg | Freq: Four times a day (QID) | INTRAMUSCULAR | Status: AC
  Administered 2015-09-03 – 2015-09-04 (×5): 30 mg via INTRAVENOUS
  Filled 2015-09-03 (×5): qty 1

## 2015-09-03 MED ORDER — MORPHINE SULFATE (PF) 2 MG/ML IV SOLN
2.0000 mg | INTRAVENOUS | Status: DC | PRN
  Administered 2015-09-03 – 2015-09-05 (×6): 2 mg via INTRAVENOUS
  Filled 2015-09-03 (×6): qty 1

## 2015-09-03 MED ORDER — INSULIN ASPART 100 UNIT/ML ~~LOC~~ SOLN
0.0000 [IU] | SUBCUTANEOUS | Status: DC
Start: 2015-09-03 — End: 2015-09-04

## 2015-09-03 MED ORDER — PANTOPRAZOLE SODIUM 20 MG PO TBEC
20.0000 mg | DELAYED_RELEASE_TABLET | Freq: Every day | ORAL | Status: DC
  Filled 2015-09-03: qty 1

## 2015-09-03 MED FILL — Heparin Sodium (Porcine) Inj 1000 Unit/ML: INTRAMUSCULAR | Qty: 30 | Status: AC

## 2015-09-03 MED FILL — Magnesium Sulfate Inj 50%: INTRAMUSCULAR | Qty: 10 | Status: AC

## 2015-09-03 MED FILL — Potassium Chloride Inj 2 mEq/ML: INTRAVENOUS | Qty: 40 | Status: AC

## 2015-09-03 NOTE — Progress Notes (Signed)
TCTS BRIEF SICU PROGRESS NOTE  1 Day Post-Op  S/P Procedure(s) (LRB): CORONARY ARTERY BYPASS GRAFTING (CABG) x 2 (LIMA-LAD, SVG-Intermediate) ENDOSCOPIC GREATER SAPHENOUS VEIN HARVEST RIGHT THIGH (N/A) TRANSESOPHAGEAL ECHOCARDIOGRAM (TEE) (N/A)   Doing well Mild soreness in chest No SOB NSR w/ stable BP O2 sats 91-94% on 4 L/min UOP adequate Labs okay  Plan: Continue routine postop  Rexene Alberts, MD 09/03/2015 8:07 PM

## 2015-09-03 NOTE — Care Management Note (Signed)
Case Management Note  Patient Details  Name: Nawaf Strange MRN: [id number] Date of Birth: [birth year]  Subjective/Objective:    Pt lives alone but dtr plans to take pt to her home in [facility] when he is medically stable for discharge, and will provide 24/7 assistance for 7-10 days.  If pt continues to need assistance after that time, states he has a cousin that he can stay with for another week.                                 Expected Discharge Plan:  Home/Self Care  Discharge planning Services  CM Consult  Status of Service:  In process, will continue to follow  Girard Cooter, RN 09/03/2015, 12:14 PM

## 2015-09-03 NOTE — Significant Event (Signed)
A line removed as it is not functional; is dampened and does not draw back blood. Hyiu Ksor, Therapist, sports.

## 2015-09-03 NOTE — Progress Notes (Signed)
[facility] DAILY ICU PROGRESS NOTE                   [address]            ,[place] [address]          [phone]   1 Day Post-Op Procedure(s) (LRB): CORONARY ARTERY BYPASS GRAFTING (CABG) x 2 (LIMA-LAD, SVG-Intermediate) ENDOSCOPIC GREATER SAPHENOUS VEIN HARVEST RIGHT THIGH (N/A) TRANSESOPHAGEAL ECHOCARDIOGRAM (TEE) (N/A)  Total Length of Stay:  LOS: 1 day   Subjective: Sore in chest, otherwise feels well.  No nausea, breathing stable.   Objective: Vital signs in last 24 hours: Temp:  [96.4 F (35.8 C)-99 F (37.2 C)] 99 F (37.2 C) (09/30 0800) Pulse Rate:  [58-92] 79 (09/30 0800) Cardiac Rhythm:  [-] Normal sinus rhythm (09/30 0400) Resp:  [12-42] 31 (09/30 0800) BP: (88-135)/(56-71) 112/65 mmHg (09/30 0800) SpO2:  [90 %-100 %] 91 % (09/30 0800) Arterial Line BP: (78-137)/(45-66) 137/61 mmHg (09/30 0800) FiO2 (%):  [50 %-60 %] 55 % (09/30 0700) Weight:  [208 lb (94.348 kg)-218 lb 4.1 oz (99 kg)] 218 lb 4.1 oz (99 kg) (09/30 0430)  Filed Weights   09/02/15 0559 09/02/15 2000 09/03/15 0430  Weight: 208 lb (94.348 kg) 208 lb (94.348 kg) 218 lb 4.1 oz (99 kg)  BASELINE WEIGHT:94.3 kg  Weight change: -0 oz (-0 kg)   Hemodynamic parameters for last 24 hours: PAP: (20-34)/(8-20) 29/13 mmHg CO:  [4.9 L/min-5.8 L/min] 5.8 L/min CI:  [2.2 L/min/m2-2.6 L/min/m2] 2.6 L/min/m2  Intake/Output from previous day: 09/29 0701 - 09/30 0700 In: 7834.2 [P.O.:1560; I.V.:3482.2; Blood:960; NG/GT:30; IV Piggyback:1802] Out: 0370 [Urine:3305; Blood:1800; Chest Tube:1060]  CBGs 115-120-124-139-126-142-87     Current Meds: Scheduled Meds: . acetaminophen  1,000 mg Oral 4 times per day   Or  . acetaminophen (TYLENOL) oral liquid 160 mg/5 mL  1,000 mg Per Tube 4 times per day  . antiseptic oral rinse  7 mL Mouth Rinse QID  . aspirin EC  325 mg Oral Daily   Or  . aspirin  324 mg Per Tube Daily  . bisacodyl  10 mg Oral Daily   Or  . bisacodyl  10 mg Rectal Daily    . cefUROXime (ZINACEF)  IV  1.5 g Intravenous Q12H  . chlorhexidine gluconate  15 mL Mouth Rinse BID  . docusate sodium  200 mg Oral Daily  . insulin aspart  0-24 Units Subcutaneous 6 times per day  . metoprolol tartrate  12.5 mg Oral BID   Or  . metoprolol tartrate  12.5 mg Per Tube BID  . [START ON 09/04/2015] pantoprazole  40 mg Oral Daily  . sodium chloride  3 mL Intravenous Q12H   Continuous Infusions: . sodium chloride 10 mL/hr at 09/02/15 2200  . sodium chloride    . sodium chloride Stopped (09/02/15 1330)  . dexmedetomidine Stopped (09/03/15 0500)  . lactated ringers 10 mL/hr at 09/02/15 2200  . lactated ringers Stopped (09/02/15 1330)  . nitroGLYCERIN Stopped (09/02/15 1330)  . norepinephrine (LEVOPHED) Adult infusion 1 mcg/min (09/03/15 0800)  . phenylephrine (NEO-SYNEPHRINE) Adult infusion Stopped (09/02/15 1330)   PRN Meds:.sodium chloride, lactated ringers, metoprolol, morphine injection, ondansetron (ZOFRAN) IV, oxyCODONE, sodium chloride, traMADol   Physical Exam: General appearance: alert, cooperative and no distress Heart: regular rate and rhythm Lungs: clear to auscultation bilaterally Extremities: No significant LE edema Wound: Dressed and dry    Lab Results: CBC: Recent Labs  09/02/15 2010 09/03/15 0400  WBC 19.8* 16.7*  HGB 10.3* 9.5*  HCT 30.2* 28.2*  PLT 156 144*   BMET:  Recent Labs  09/02/15 0715  09/02/15 2009 09/02/15 2010 09/03/15 0400  NA 136  < > 139  --  134*  K 4.3  < > 4.4  --  4.1  CL 105  < > 105  --  105  CO2 24  --   --   --  23  GLUCOSE 102*  < > 129*  --  87  BUN 18  < > 15  --  12  CREATININE 0.78  < > 0.80 0.82 0.78  CALCIUM 8.7*  --   --   --  8.0*  < > = values in this interval not displayed.  PT/INR:  Recent Labs  09/02/15 1345  LABPROT 17.3*  INR 1.41   Radiology: Dg Chest Port 1 View  09/03/2015   CLINICAL DATA:  Status post CABG  EXAM: PORTABLE CHEST 1 VIEW  COMPARISON:  Portable chest x-ray of  September 02, 2015  FINDINGS: There has been interval extubation of the trachea and of the esophagus. The lungs are adequately inflated. The interstitial markings remain increased but have improved since yesterday's study. There is no pneumothorax or significant pleural effusion. The left chest tube is stable in position. A tubular structure transversely oriented over the right lung likely reflects a chest tube is well and is stable. The mediastinal drain is also stable. The Swan-Ganz catheter tip loops upon itself and lies in the proximal left main pulmonary artery. This is stable. The permanent pacemaker defibrillator is unchanged in position.  IMPRESSION: Post CABG changes with improving aeration of both lungs and decreased pulmonary interstitial edema. The remaining support tubes are in reasonable position radiographically.   Electronically Signed   By: David  Martinique M.D.   On: 09/03/2015 07:48   Dg Chest Port 1 View  09/02/2015   CLINICAL DATA:  Status post CABG  EXAM: PORTABLE CHEST 1 VIEW  COMPARISON:  08/31/2015  FINDINGS: Interval CABG. Endotracheal tube with the tip 3.8 cm above the carina. Nasogastric tube coursing below the diaphragm. Single lead cardiac pacemaker. There is a Swan-Ganz catheter with the tip coiled in the pulmonary outflow track. There is no pleural effusion or pneumothorax.  There are bilateral chest tubes. There is no pneumothorax. There is no significant pleural effusion. There is hazy right lower lung airspace disease likely reflecting atelectasis.  There is stable cardiomegaly.  Interval median sternotomy.  IMPRESSION: 1. Endotracheal tube with the tip 3.8 cm above the carina. 2. Swan-Ganz catheter with the tip coiled in the pulmonary outflow track. 3. Hazy right lower lung airspace disease likely reflecting atelectasis. Attention on follow-up examination is recommended.   Electronically Signed   By: Kathreen Devoid   On: 09/02/2015 14:17     Assessment/Plan: S/P Procedure(s)  (LRB): CORONARY ARTERY BYPASS GRAFTING (CABG) x 2 (LIMA-LAD, SVG-Intermediate) ENDOSCOPIC GREATER SAPHENOUS VEIN HARVEST RIGHT THIGH (N/A) TRANSESOPHAGEAL ECHOCARDIOGRAM (TEE) (N/A)  CV- Maintaining SR, BPs stable. Weaning off Levophed.  CT output high overnight, will leave CTs in for now.  Expected postop blood loss anemia- H/H generally stable. Continue to watch.  Pain control is biggest issue this am, using a lot of prn Morphine. Will add a few doses of scheduled Toradol and watch.  Mobilize, continue IS/pulm toilet, routine POD #1 progression.  COLLINS,GINA H 09/03/2015 8:07 AM

## 2015-09-04 ENCOUNTER — Inpatient Hospital Stay (HOSPITAL_COMMUNITY): Payer: Medicaid Other

## 2015-09-04 LAB — CBC
HCT: 30.5 % — ABNORMAL LOW (ref 39.0–52.0)
Hemoglobin: 10.2 g/dL — ABNORMAL LOW (ref 13.0–17.0)
MCH: 29.8 pg (ref 26.0–34.0)
MCHC: 33.4 g/dL (ref 30.0–36.0)
MCV: 89.2 fL (ref 78.0–100.0)
PLATELETS: 135 10*3/uL — AB (ref 150–400)
RBC: 3.42 MIL/uL — AB (ref 4.22–5.81)
RDW: 16 % — AB (ref 11.5–15.5)
WBC: 14 10*3/uL — AB (ref 4.0–10.5)

## 2015-09-04 LAB — BASIC METABOLIC PANEL
Anion gap: 7 (ref 5–15)
BUN: 17 mg/dL (ref 6–20)
CO2: 29 mmol/L (ref 22–32)
CREATININE: 0.99 mg/dL (ref 0.61–1.24)
Calcium: 8.7 mg/dL — ABNORMAL LOW (ref 8.9–10.3)
Chloride: 96 mmol/L — ABNORMAL LOW (ref 101–111)
Glucose, Bld: 113 mg/dL — ABNORMAL HIGH (ref 65–99)
POTASSIUM: 4.1 mmol/L (ref 3.5–5.1)
SODIUM: 132 mmol/L — AB (ref 135–145)

## 2015-09-04 LAB — TROPONIN I
TROPONIN I: 1.36 ng/mL — AB (ref <0.031)
Troponin I: 0.82 ng/mL (ref <0.031)

## 2015-09-04 LAB — CK TOTAL AND CKMB (NOT AT ARMC)
CK TOTAL: 327 U/L (ref 49–397)
CK, MB: 4.8 ng/mL (ref 0.5–5.0)
CK, MB: 7.4 ng/mL — ABNORMAL HIGH (ref 0.5–5.0)
RELATIVE INDEX: 1.5 (ref 0.0–2.5)
Relative Index: 1.5 (ref 0.0–2.5)
Total CK: 499 U/L — ABNORMAL HIGH (ref 49–397)

## 2015-09-04 LAB — GLUCOSE, CAPILLARY
Glucose-Capillary: 111 mg/dL — ABNORMAL HIGH (ref 65–99)
Glucose-Capillary: 114 mg/dL — ABNORMAL HIGH (ref 65–99)

## 2015-09-04 LAB — BRAIN NATRIURETIC PEPTIDE: B Natriuretic Peptide: 677.1 pg/mL — ABNORMAL HIGH (ref 0.0–100.0)

## 2015-09-04 MED ORDER — POTASSIUM CHLORIDE CRYS ER 20 MEQ PO TBCR
20.0000 meq | EXTENDED_RELEASE_TABLET | Freq: Every day | ORAL | Status: DC
  Administered 2015-09-05: 20 meq via ORAL
  Filled 2015-09-04 (×2): qty 1

## 2015-09-04 MED ORDER — SPIRONOLACTONE 25 MG PO TABS
25.0000 mg | ORAL_TABLET | Freq: Every day | ORAL | Status: DC
  Administered 2015-09-04 – 2015-09-06 (×3): 25 mg via ORAL
  Filled 2015-09-04 (×4): qty 1

## 2015-09-04 MED ORDER — SODIUM CHLORIDE 0.9 % IJ SOLN: 3.0000 mL | Freq: Two times a day (BID) | INTRAMUSCULAR | Status: DC

## 2015-09-04 MED ORDER — CETYLPYRIDINIUM CHLORIDE 0.05 % MT LIQD
7.0000 mL | Freq: Two times a day (BID) | OROMUCOSAL | Status: DC
  Administered 2015-09-04 – 2015-09-07 (×5): 7 mL via OROMUCOSAL

## 2015-09-04 MED ORDER — FUROSEMIDE 40 MG PO TABS
40.0000 mg | ORAL_TABLET | Freq: Every day | ORAL | Status: DC
  Administered 2015-09-05: 40 mg via ORAL
  Filled 2015-09-04 (×2): qty 1

## 2015-09-04 MED ORDER — ALLOPURINOL 300 MG PO TABS
300.0000 mg | ORAL_TABLET | Freq: Every day | ORAL | Status: DC
  Administered 2015-09-04 – 2015-09-07 (×4): 300 mg via ORAL
  Filled 2015-09-04 (×5): qty 1

## 2015-09-04 MED ORDER — LISINOPRIL 5 MG PO TABS
5.0000 mg | ORAL_TABLET | Freq: Every day | ORAL | Status: DC
  Administered 2015-09-04 – 2015-09-07 (×3): 5 mg via ORAL
  Filled 2015-09-04 (×4): qty 1

## 2015-09-04 MED ORDER — SODIUM CHLORIDE 0.9 % IJ SOLN: 3.0000 mL | INTRAMUSCULAR | Status: DC | PRN

## 2015-09-04 MED ORDER — ATORVASTATIN CALCIUM 40 MG PO TABS
40.0000 mg | ORAL_TABLET | Freq: Every day | ORAL | Status: DC
  Administered 2015-09-04 – 2015-09-06 (×3): 40 mg via ORAL
  Filled 2015-09-04 (×4): qty 1

## 2015-09-04 MED ORDER — MOVING RIGHT ALONG BOOK
Freq: Once | Status: AC
  Administered 2015-09-04: 10:00:00
  Filled 2015-09-04: qty 1

## 2015-09-04 MED ORDER — CARVEDILOL 6.25 MG PO TABS
6.2500 mg | ORAL_TABLET | Freq: Two times a day (BID) | ORAL | Status: DC
  Administered 2015-09-04 – 2015-09-07 (×5): 6.25 mg via ORAL
  Filled 2015-09-04 (×9): qty 1

## 2015-09-04 MED ORDER — ASPIRIN EC 81 MG PO TBEC
81.0000 mg | DELAYED_RELEASE_TABLET | Freq: Every day | ORAL | Status: DC
  Administered 2015-09-04 – 2015-09-07 (×4): 81 mg via ORAL
  Filled 2015-09-04 (×4): qty 1

## 2015-09-04 MED ORDER — INSULIN ASPART 100 UNIT/ML ~~LOC~~ SOLN: 0.0000 [IU] | Freq: Three times a day (TID) | SUBCUTANEOUS | Status: DC

## 2015-09-04 MED ORDER — ALPRAZOLAM 0.25 MG PO TABS
0.2500 mg | ORAL_TABLET | Freq: Four times a day (QID) | ORAL | Status: DC | PRN
  Administered 2015-09-05 – 2015-09-06 (×3): 0.25 mg via ORAL
  Filled 2015-09-04 (×3): qty 1

## 2015-09-04 MED ORDER — PANTOPRAZOLE SODIUM 40 MG PO TBEC
40.0000 mg | DELAYED_RELEASE_TABLET | Freq: Two times a day (BID) | ORAL | Status: DC
  Administered 2015-09-04 – 2015-09-07 (×7): 40 mg via ORAL
  Filled 2015-09-04 (×6): qty 1

## 2015-09-04 MED ORDER — SODIUM CHLORIDE 0.9 % IV SOLN: 250.0000 mL | INTRAVENOUS | Status: DC | PRN

## 2015-09-04 MED ORDER — CLOPIDOGREL BISULFATE 75 MG PO TABS
75.0000 mg | ORAL_TABLET | Freq: Every day | ORAL | Status: DC
  Administered 2015-09-04 – 2015-09-07 (×4): 75 mg via ORAL
  Filled 2015-09-04 (×4): qty 1

## 2015-09-04 MED ORDER — ADULT MULTIVITAMIN W/MINERALS CH
1.0000 | ORAL_TABLET | Freq: Every day | ORAL | Status: DC
  Administered 2015-09-04 – 2015-09-07 (×4): 1 via ORAL
  Filled 2015-09-04 (×4): qty 1

## 2015-09-04 MED ORDER — ALUM & MAG HYDROXIDE-SIMETH 200-200-20 MG/5ML PO SUSP
15.0000 mL | ORAL | Status: DC | PRN
  Administered 2015-09-04 (×3): 30 mL via ORAL
  Filled 2015-09-04 (×3): qty 30

## 2015-09-04 MED ORDER — METOPROLOL TARTRATE 25 MG PO TABS: 25.0000 mg | ORAL_TABLET | Freq: Two times a day (BID) | ORAL | Status: DC

## 2015-09-04 NOTE — Progress Notes (Signed)
Flushed Left Forearm IV with saline this AM, no burning or irritation. Used IV for Zinacef antibiotic and arm became swollen and sore. Called Pharmacy about possible infiltration and was advised to elevate arm and apply ice. MD aware. Will continue to monitor.  Maia Petties, RN

## 2015-09-04 NOTE — Progress Notes (Signed)
[address]       [place] [address]             [phone]        CARDIOTHORACIC SURGERY PROGRESS NOTE   R2 Days Post-Op Procedure(s) (LRB): CORONARY ARTERY BYPASS GRAFTING (CABG) x 2 (LIMA-LAD, SVG-Intermediate) ENDOSCOPIC GREATER SAPHENOUS VEIN HARVEST RIGHT THIGH (N/A) TRANSESOPHAGEAL ECHOCARDIOGRAM (TEE) (N/A)  Subjective: Looks good but complains that he couldn't sleep last night.  Also reports "heartburn"-like indigestion  Objective: Vital signs: BP Readings from Last 1 Encounters:  09/04/15 148/89   Pulse Readings from Last 1 Encounters:  09/04/15 97   Resp Readings from Last 1 Encounters:  09/04/15 21   Temp Readings from Last 1 Encounters:  09/04/15 98.1 F (36.7 C) Oral    Hemodynamics:    Physical Exam:  Rhythm:   sinus  Breath sounds: clear  Heart sounds:  RRR  Incisions:  Dressing clean and dry  Abdomen:  Soft, non-distended, non-tender  Extremities:  Warm, well-perfused  Chest tubes:  Low volume thin serosanguinous output but still too much to remove chest tubes, no air leak    Intake/Output from previous day: 09/30 0701 - 10/01 0700 In: 1435 [P.O.:1320; I.V.:10; IV Piggyback:105] Out: 2423 [Urine:3240; Chest Tube:970] Intake/Output this shift: Total I/O In: 50 [IV Piggyback:50] Out: 425 [Urine:325; Chest Tube:100]  Lab Results:  CBC: Recent Labs  09/03/15 1842 09/04/15 0054  WBC 16.4* 14.0*  HGB 9.8* 10.2*  HCT 29.6* 30.5*  PLT 125* 135*    BMET:  Recent Labs  09/03/15 0400 09/03/15 1842 09/04/15 0054  NA 134*  --  132*  K 4.1  --  4.1  CL 105  --  96*  CO2 23  --  29  GLUCOSE 87  --  113*  BUN 12  --  17  CREATININE 0.78 0.96 0.99  CALCIUM 8.0*  --  8.7*     PT/INR:   Recent Labs  09/02/15 1345  LABPROT 17.3*  INR 1.41    CBG (last 3)   Recent Labs  09/03/15 1157 09/03/15 1611 09/03/15 2134  GLUCAP 95 106* 99    ABG    Component Value Date/Time   PHART 7.370 09/02/2015  1821   PCO2ART 34.9* 09/02/2015 1821   PO2ART 57.0* 09/02/2015 1821   HCO3 20.3 09/02/2015 1821   TCO2 20 09/02/2015 2009   ACIDBASEDEF 5.0* 09/02/2015 1821   O2SAT 89.0 09/02/2015 1821    CXR: PORTABLE CHEST 1 VIEW  COMPARISON: September 03, 2015  FINDINGS: Left jugular catheter has been removed. No pneumothorax. There is a chest tube present on each side. There is patchy atelectatic change in both lower lobes, stable. Pacemaker lead is attached to the right ventricle. Heart is borderline enlarged with pulmonary vascularity within normal limits. No adenopathy.  IMPRESSION: Stable bilateral lower lobe atelectatic change. No airspace consolidation appreciable. No change in cardiac silhouette. No pneumothorax.   Electronically Signed  By: Lowella Grip III M.D.  On: 09/04/2015 07:24  Assessment/Plan: S/P Procedure(s) (LRB): CORONARY ARTERY BYPASS GRAFTING (CABG) x 2 (LIMA-LAD, SVG-Intermediate) ENDOSCOPIC GREATER SAPHENOUS VEIN HARVEST RIGHT THIGH (N/A) TRANSESOPHAGEAL ECHOCARDIOGRAM (TEE) (N/A)  Overall doing well POD2 Maintaining NSR w/ stable BP Thin serosanguinous output via chest tubes - still too much for tube removal Expected post op acute blood loss anemia, mild, stable Expected post op volume excess, mild, diuresing well Expected post op atelectasis, mild   Mobilize  Pulm toilet  Diuresis  Leave chest  tubes in place for now   Rexene Alberts, MD 09/04/2015 9:28 AM

## 2015-09-04 NOTE — Progress Notes (Signed)
[facility] BRIEF SICU PROGRESS NOTE  2 Days Post-Op  S/P Procedure(s) (LRB): CORONARY ARTERY BYPASS GRAFTING (CABG) x 2 (LIMA-LAD, SVG-Intermediate) ENDOSCOPIC GREATER SAPHENOUS VEIN HARVEST RIGHT THIGH (N/A) TRANSESOPHAGEAL ECHOCARDIOGRAM (TEE) (N/A)   Stable day  Plan: Continue current plan  Rexene Alberts, MD 09/04/2015 6:25 PM

## 2015-09-05 ENCOUNTER — Inpatient Hospital Stay (HOSPITAL_COMMUNITY): Payer: Medicaid Other

## 2015-09-05 LAB — CBC
HEMATOCRIT: 28 % — AB (ref 39.0–52.0)
Hemoglobin: 9.5 g/dL — ABNORMAL LOW (ref 13.0–17.0)
MCH: 30.4 pg (ref 26.0–34.0)
MCHC: 33.9 g/dL (ref 30.0–36.0)
MCV: 89.7 fL (ref 78.0–100.0)
Platelets: 135 10*3/uL — ABNORMAL LOW (ref 150–400)
RBC: 3.12 MIL/uL — ABNORMAL LOW (ref 4.22–5.81)
RDW: 16 % — AB (ref 11.5–15.5)
WBC: 14.3 10*3/uL — AB (ref 4.0–10.5)

## 2015-09-05 LAB — BASIC METABOLIC PANEL
ANION GAP: 6 (ref 5–15)
BUN: 16 mg/dL (ref 6–20)
CALCIUM: 8.1 mg/dL — AB (ref 8.9–10.3)
CO2: 28 mmol/L (ref 22–32)
Chloride: 98 mmol/L — ABNORMAL LOW (ref 101–111)
Creatinine, Ser: 0.89 mg/dL (ref 0.61–1.24)
GFR calc Af Amer: >60 mL/min (ref >60)
GFR calc non Af Amer: >60 mL/min (ref >60)
GLUCOSE: 113 mg/dL — AB (ref 65–99)
Potassium: 4.7 mmol/L (ref 3.5–5.1)
Sodium: 132 mmol/L — ABNORMAL LOW (ref 135–145)

## 2015-09-05 LAB — GLUCOSE, CAPILLARY
GLUCOSE-CAPILLARY: 93 mg/dL (ref 65–99)
Glucose-Capillary: 130 mg/dL — ABNORMAL HIGH (ref 65–99)
Glucose-Capillary: 116 mg/dL — ABNORMAL HIGH (ref 65–99)

## 2015-09-05 MED ORDER — FUROSEMIDE 40 MG PO TABS
40.0000 mg | ORAL_TABLET | Freq: Every day | ORAL | Status: AC
  Administered 2015-09-06 – 2015-09-07 (×2): 40 mg via ORAL
  Filled 2015-09-05 (×2): qty 1

## 2015-09-05 NOTE — Progress Notes (Signed)
      [address]       [place] [address]             [phone]        CARDIOTHORACIC SURGERY PROGRESS NOTE   R3 Days Post-Op Procedure(s) (LRB): CORONARY ARTERY BYPASS GRAFTING (CABG) x 2 (LIMA-LAD, SVG-Intermediate) ENDOSCOPIC GREATER SAPHENOUS VEIN HARVEST RIGHT THIGH (N/A) TRANSESOPHAGEAL ECHOCARDIOGRAM (TEE) (N/A)  Subjective: Feels well.  Ambulated twice around SICU.  Eating better.  Objective: Vital signs: BP Readings from Last 1 Encounters:  09/05/15 131/112   Pulse Readings from Last 1 Encounters:  09/05/15 83   Resp Readings from Last 1 Encounters:  09/05/15 28   Temp Readings from Last 1 Encounters:  09/05/15 98.2 F (36.8 C) Oral    Hemodynamics:    Physical Exam:  Rhythm:   sinus  Breath sounds: clear  Heart sounds:  RRR  Incisions:  Clean and dry  Abdomen:  Soft, non-distended, non-tender  Extremities:  Warm, well-perfused  Chest tubes:  Low volume thin serosanguinous output, no air leak    Intake/Output from previous day: 10/01 0701 - 10/02 0700 In: 1590 [P.O.:1540; IV Piggyback:50] Out: 9147 [Urine:3025; Chest Tube:440] Intake/Output this shift: Total I/O In: -  Out: 30 [Chest Tube:30]  Lab Results:  CBC: Recent Labs  09/04/15 0054 09/05/15 0256  WBC 14.0* 14.3*  HGB 10.2* 9.5*  HCT 30.5* 28.0*  PLT 135* 135*    BMET:  Recent Labs  09/04/15 0054 09/05/15 0256  NA 132* 132*  K 4.1 4.7  CL 96* 98*  CO2 29 28  GLUCOSE 113* 113*  BUN 17 16  CREATININE 0.99 0.89  CALCIUM 8.7* 8.1*     PT/INR:   Recent Labs  09/02/15 1345  LABPROT 17.3*  INR 1.41    CBG (last 3)   Recent Labs  09/04/15 1216 09/04/15 1558 09/05/15 0852  GLUCAP 114* 130* 116*    ABG    Component Value Date/Time   PHART 7.370 09/02/2015 1821   PCO2ART 34.9* 09/02/2015 1821   PO2ART 57.0* 09/02/2015 1821   HCO3 20.3 09/02/2015 1821   TCO2 20 09/02/2015 2009   ACIDBASEDEF 5.0* 09/02/2015 1821   O2SAT 89.0 09/02/2015  1821    CXR: PORTABLE CHEST 1 VIEW  COMPARISON: 09/04/2015  FINDINGS: Bilateral chest tubes in place unchanged. No pneumothorax  Bibasilar atelectasis unchanged. No significant pleural effusion or edema.  IMPRESSION: Negative for pneumothorax.  Bibasilar atelectasis unchanged.   Electronically Signed  By: Franchot Gallo M.D.  On: 09/05/2015 07:35  Assessment/Plan: S/P Procedure(s) (LRB): CORONARY ARTERY BYPASS GRAFTING (CABG) x 2 (LIMA-LAD, SVG-Intermediate) ENDOSCOPIC GREATER SAPHENOUS VEIN HARVEST RIGHT THIGH (N/A) TRANSESOPHAGEAL ECHOCARDIOGRAM (TEE) (N/A)  Overall doing well Maintaining NSR w/ stable BP Chest tube drainage has decreased   D/C tubes  Mobilize  Transfer 2W  Rexene Alberts, MD 09/05/2015 9:58 AM

## 2015-09-05 NOTE — Progress Notes (Signed)
09/05/2015 1400 Received as a transfer from 2S.  Pt is A&Ox4.  No c/o voiced.  Tele applied, CCMD notified.  Oriented to room, call light and bed.  Call bell in reach. [place]

## 2015-09-06 ENCOUNTER — Inpatient Hospital Stay (HOSPITAL_COMMUNITY): Payer: Medicaid Other

## 2015-09-06 LAB — BASIC METABOLIC PANEL
Anion gap: 6 (ref 5–15)
BUN: 17 mg/dL (ref 6–20)
CHLORIDE: 103 mmol/L (ref 101–111)
CO2: 29 mmol/L (ref 22–32)
CREATININE: 0.9 mg/dL (ref 0.61–1.24)
Calcium: 8.5 mg/dL — ABNORMAL LOW (ref 8.9–10.3)
GFR calc Af Amer: >60 mL/min (ref >60)
GFR calc non Af Amer: >60 mL/min (ref >60)
GLUCOSE: 107 mg/dL — AB (ref 65–99)
Potassium: 4.3 mmol/L (ref 3.5–5.1)
Sodium: 138 mmol/L (ref 135–145)

## 2015-09-06 LAB — TROPONIN I: Troponin I: 0.38 ng/mL — ABNORMAL HIGH (ref <0.031)

## 2015-09-06 LAB — CK TOTAL AND CKMB (NOT AT ARMC)
CK, MB: 2.2 ng/mL (ref 0.5–5.0)
Relative Index: INVALID (ref 0.0–2.5)
Total CK: 94 U/L (ref 49–397)

## 2015-09-06 MED ORDER — LACTULOSE 10 GM/15ML PO SOLN
20.0000 g | Freq: Once | ORAL | Status: AC
  Administered 2015-09-06: 20 g via ORAL
  Filled 2015-09-06: qty 30

## 2015-09-06 NOTE — Progress Notes (Signed)
1036-1100 Came to see pt to walk. Stated he has walked three times today to [place] side. Encouraged pt to stay on unit when walking. Encouraged IS. Checked RA sats at 92%. Pt stated he walked without walker or oxygen. Feeling nauseated now. Encouraged him to rest up about 3 hours between walks. Pt had questions about restarting CRP 2 so discussed and will refer back to [place]. Pt stated he will walk later after resting and did not need for me to check back. Will follow up tomorrow. Went off unit and got pt decaf coffee as machine broken here. Graylon Good RN BSN 09/06/2015 11:00 AM

## 2015-09-06 NOTE — Progress Notes (Signed)
RT instructed patient on use of flutter valve.  Patient able to demonstrate back proper technique.

## 2015-09-06 NOTE — Progress Notes (Addendum)
      [address]       [place] [address]             [phone]        4 Days Post-Op Procedure(s) (LRB): CORONARY ARTERY BYPASS GRAFTING (CABG) x 2 (LIMA-LAD, SVG-Intermediate) ENDOSCOPIC GREATER SAPHENOUS VEIN HARVEST RIGHT THIGH (N/A) TRANSESOPHAGEAL ECHOCARDIOGRAM (TEE) (N/A)  Subjective: Patient with complaints of constipation and pain at proximal sternal incision.  Objective: Vital signs in last 24 hours: Temp:  [98.2 F (36.8 C)-98.7 F (37.1 C)] 98.2 F (36.8 C) (10/03 0954) Pulse Rate:  [42-100] 85 (10/03 0954) Cardiac Rhythm:  [-] Sinus tachycardia (10/03 0815) Resp:  [14-19] 14 (10/03 0954) BP: (107-132)/(61-86) 110/61 mmHg (10/03 0954) SpO2:  [94 %-97 %] 95 % (10/03 0954) Weight:  [207 lb 14.3 oz (94.3 kg)] 207 lb 14.3 oz (94.3 kg) (10/03 0546)  Pre op weight 94 kg Current Weight  09/06/15 207 lb 14.3 oz (94.3 kg)      Intake/Output from previous day: 10/02 0701 - 10/03 0700 In: 52 [P.O.:970] Out: 990 [Urine:900; Chest Tube:90]   Physical Exam:  Cardiovascular: RRR Pulmonary: Slightly diminished at bases bilaterally; no rales, wheezes, or rhonchi. Abdomen: Soft, non tender, bowel sounds present. Extremities: Mild bilateral lower extremity edema. Ecchymosis right thigh. Wounds: Clean and dry.  No erythema or signs of infection.  Lab Results: CBC: Recent Labs  09/04/15 0054 09/05/15 0256  WBC 14.0* 14.3*  HGB 10.2* 9.5*  HCT 30.5* 28.0*  PLT 135* 135*   BMET:  Recent Labs  09/05/15 0256 09/06/15 0542  NA 132* 138  K 4.7 4.3  CL 98* 103  CO2 28 29  GLUCOSE 113* 107*  BUN 16 17  CREATININE 0.89 0.90  CALCIUM 8.1* 8.5*    PT/INR:  Lab Results  Component Value Date   INR 1.41 09/02/2015   INR 1.04 08/31/2015   INR 1.02 08/11/2015   ABG:  INR: Will add last result for INR, ABG once components are confirmed Will add last 4 CBG results once components are confirmed  Assessment/Plan:  1. CV - Occasional  ST in the low 100's.SR in the 80's. On Coreg 6.25 mg bid, Plavix 75 mg daily, Lisinopril 5 mg daily, and Spironolactone 25 mg daily. 2.  Pulmonary - On room air. CXR showed persistent left lower lobe atelectasis. Encourage incentive spirometer and flutter valve 3. Volume Overload - On Lasix 40 mg daily 4.  Acute blood loss anemia - Last H and H 9.5 and 28 5. LOC constipation 6. Remove EPW 7. Pre op HGA1C 6 so will need further surveillance by medical doctor after discharge. 8.Possible discharge in am  ZIMMERMAN,DONIELLE MPA-C 09/06/2015,12:49 PM  I have seen and examined the patient and agree with the assessment and plan as outlined.  Rexene Alberts, MD 09/06/2015

## 2015-09-06 NOTE — Progress Notes (Signed)
Per MD order, pt O2 weaned from 5L to 3L. Pt tolerated well with no signs of dyspnea or discomfort. Will pass on to oncoming shift.  Raliegh Ip RN

## 2015-09-06 NOTE — Discharge Summary (Signed)
Physician Discharge Summary       [address]       [place] [address]             [phone]    Patient ID: Kerry Mason MRN: [id number] DOB/AGE: 07-04-1956 59 y.o.  Admit date: 09/02/2015 Discharge date: 09/07/2015   Admission Diagnoses: 1. History of CAD (s/t PCI with stents) 2. History of hypertension 3. History of hyperlipidemia 4. History of obesity 5. History of tobacco abuse 6. History of ischemic cardiomyopathy 7. History of chronic systolic CHF (congestive heart failure), NYHA class 3 ([place]) 8. History of AICD 9. History of cardiac tamponade (3/2016coronary perforation during CTO procedure. Sealed with graftmaster coated stent.)   Discharge Diagnoses:  1. History of CAD (s/t PCI with stents) 2. History of hypertension 3. History of hyperlipidemia 4. History of obesity 5. History of tobacco abuse 6. History of ischemic cardiomyopathy 7. History of chronic systolic CHF (congestive heart failure), NYHA class 3 ([place]) 8. History of AICD 9. History of cardiac tamponade (3/2016coronary perforation during CTO procedure. Sealed with graftmaster coated stent.) 10. ABL anemia 11. Mild thrombocytopenia   Procedure (s):   Coronary Artery Bypass Grafting x 2 Left Internal Mammary Artery to Distal Left Anterior Descending Coronary Artery Saphenous Vein Graft to Ramus Intermediate Branch Endoscopic Vein Harvest from Right Thigh     History of Presenting Illness: Patient is a 59 year old male with long-standing history of coronary artery disease, ischemic cardiomyopathy with chronic systolic congestive heart failure, hypertension, hyperlipidemia, and previous history of tobacco abuse who has been referred for surgical consultation to discuss treatment options for management of ischemic heart disease. The patient's cardiac history dates back to 2000 when he presented with angina pectoris. He underwent PCI and  stenting of the right coronary artery at that time and apparently was subsequently lost to follow-up. In January 2015 he suffered a massive ST segment elevation myocardial infarction with occlusion of the proximal left anterior descending coronary artery. He was noted to have occlusion of the right coronary artery at that time that was felt to be chronic. He was treated with PCI and stenting of the left anterior descending coronary artery with drug-eluting stents. He subsequently underwent ICD implantation for primary prophylaxis because of ejection fraction estimated 30-35%. In December 2015 he underwent follow-up stress my view because of symptoms of exertional shortness of breath and chest pain. Diagnostic cardiac catheterization revealed widely patent stents in the left anterior descending coronary artery. There remained chronic occlusion of the right coronary artery. He subsequently underwent an attempt at PCI of his chronic total occlusion of the right coronary artery on 02/03/2015. This was complicated by perforation of mid right coronary artery with cardiac tamponade. He was treated with pericardiocentesis. Perforation of the coronary artery was sealed using a covered stent in the mid right coronary artery. An additional 4 drug eluding stents were placed in the RCA at that time. He recovered without need for open surgical intervention. In July the patient was hospitalized with an acute non-ST segment elevation myocardial infarction. Diagnostic cardiac catheterization performed at that time demonstrated chronic occlusion of the right coronary artery with 50% stenosis of the distal left main coronary artery. He was treated medically. The patient was recently seen in follow-up and continued to complain of fatigue, exertional shortness of breath, and intermittent chest discomfort. Repeat diagnostic cardiac catheterization was performed revealing similar findings with 50% stenosis of the distal left main  coronary artery and chronic occlusion of right coronary  artery. Fractional flow wire analysis was performed of the left main coronary stenosis, confirming the presence of hemodynamically significant disease. The patient was referred for surgical consultation.  The patient is single and lives with his brother in [place], [place]. He works for the [employer]. He has been functionally independent and physically active most of his life, although ever since his heart attack in 2015 he has experienced progressive symptoms of exertional fatigue and shortness of breath. He currently states that he gets short of breath with moderate physical activity. This does not seem to limit his day-to-day activities to any significant degree. He complains of chronic fatigue. He denies any history of resting shortness of breath, PND, orthopnea or lower extremity edema. He has atypical symptoms of pain across his chest that seemed to come and go sporadically and are not necessarily related to physical exertion. Pain is described as dull and sometimes located across the left side of his chest, sometimes radiating to left shoulder. Patient has left main coronary artery disease and multivessel coronary artery disease with chronic occlusion of the right coronary artery. He presents with long-standing symptoms of exertional fatigue and shortness of breath consistent with chronic systolic congestive heart failure, New York Heart Association functional class II. He also has intermittent symptoms of chest discomfort that are somewhat atypical in nature but potentially consistent with angina pectoris. Dr. Roxy Manns reviewed the patient's last several cardiac catheterizations. The patient has 50% stenosis of the left main coronary artery, and this has been confirmed to be hemodynamically significant using fractional flow wire analysis. There is chronic occlusion of the right coronary artery with left-to-right collateral filling of  the terminal branches. The terminal branches of the distal right coronary system are quite small and may or may not be large enough for grafting. Dr. Roxy Manns discussed with the patient he would best be treated with surgical revascularization. Preoperative carotid duplex showed no significant internal carotid artery stenosis bilaterally. The patient underwent a CABG x 2 on 09/02/2015.    Brief Hospital Course:  The patient was extubated the evening of surgery without difficulty. He remained afebrile and hemodynamically stable. Gordy Councilman, a line, chest tubes, and foley were removed early in the post operative course. Coreg was started and titrated accordingly. He was volume overloaded and diuresed. He had ABL anemia. He did not require a post op transfusion. His last H and H was 9.5 and 28. He also had mild thrombocytopenia. His last platelet count was 135,000.He was weaned off the insulin drip. The patient's glucose remained well controlled. The patient's HGA1C pre op was 6. He will need to follow up with medical doctor after discharge for further surveillance of HGA1C.  The patient was felt surgically stable for transfer from the ICU to PCTU for further convalescence on 09/05/2015. He continues to progress with cardiac rehab. He is ambulating on room air. He has been tolerating a diet and has had a bowel movement. Epicardial pacing wires have already been removed. Chest tube sutures will be removed prior to discharge. The patient is felt surgically stable for discharge today.    Latest Vital Signs: Blood pressure 108/60, pulse 84, temperature 98.1 F (36.7 C), temperature source Oral, resp. rate 18, height 6\' 1"  (1.854 m), weight 205 lb 4 oz (93.1 kg), SpO2 94 %.  Physical Exam: Cardiovascular: RRR Pulmonary: Slightly diminished at bases bilaterally; no rales, wheezes, or rhonchi. Abdomen: Soft, non tender, bowel sounds present. Extremities: Mild bilateral lower extremity edema. Ecchymosis right  thigh. Wounds: Clean and dry. No erythema or signs of infection.    Discharge Condition:Stable and discharged to home   Recent laboratory studies:  Lab Results  Component Value Date   WBC 14.3* 09/05/2015   HGB 9.5* 09/05/2015   HCT 28.0* 09/05/2015   MCV 89.7 09/05/2015   PLT 135* 09/05/2015   Lab Results  Component Value Date   NA 138 09/06/2015   K 4.3 09/06/2015   CL 103 09/06/2015   CO2 29 09/06/2015   CREATININE 0.90 09/06/2015   GLUCOSE 107* 09/06/2015     Diagnostic Studies: Dg Chest 2 View  09/06/2015   CLINICAL DATA:  Status post CABG on September 02, 2015  EXAM: CHEST  2 VIEW  COMPARISON:  Portable chest x-ray of September 05, 2015  FINDINGS: The lungs are well-expanded. There is persistent left lower lobe atelectasis with trace left pleural effusion. The heart is top-normal in size. The pulmonary vascularity is normal. The permanent pacemaker defibrillator is in reasonable position. There are 8 intact sternal wires. The mediastinum is normal in width. The retrosternal soft tissues reveal small amounts of loculated air with some pleural thickening.  IMPRESSION: Persistent left lower lobe atelectasis and trace pleural effusion. Postsurgical changes in the retrosternal region. No CHF nor other acute abnormality.   Electronically Signed   By: David  Martinique M.D.   On: 09/06/2015 08:01        Discharge Instructions    Amb Referral to Cardiac Rehabilitation    Complete by:  As directed   Congestive Heart Failure: If diagnosis is Heart Failure, patient MUST meet each of the CMS criteria: 1. Left Ventricular Ejection Fraction </= 35% 2. NYHA class II-IV symptoms despite being on optimal heart failure therapy for at least 6 weeks. 3. Stable = have not had a recent (<6 weeks) or planned (<6 months) major cardiovascular hospitalization or procedure  Program Details: - Physician supervised classes - 1-3 classes per week over a 12-18 week period, generally for a total of 36  sessions  Physician Certification: I certify that the above Cardiac Rehabilitation treatment is medically necessary and is medically approved by me for treatment of this patient. The patient is willing and cooperative, able to ambulate and medically stable to participate in exercise rehabilitation. The participant's progress and Individualized Treatment Plan will be reviewed by the Medical Director, Cardiac Rehab staff and as indicated by the Referring/Ordering Physician.  Diagnosis:  CABG            Discharge Medications:   Medication List    STOP taking these medications        isosorbide mononitrate 30 MG 24 hr tablet  Commonly known as:  IMDUR      TAKE these medications        acetaminophen 325 MG tablet  Commonly known as:  TYLENOL  Take 2 tablets (650 mg total) by mouth every 4 (four) hours as needed for headache or mild pain.     allopurinol 300 MG tablet  Commonly known as:  ZYLOPRIM  TAKE ONE TABLET BY MOUTH ONCE DAILY     ALPRAZolam 0.25 MG tablet  Commonly known as:  XANAX  Take 1 tablet (0.25 mg total) by mouth 3 (three) times daily as needed for anxiety or sleep.     aspirin EC 81 MG tablet  Take 81 mg by mouth daily.     atorvastatin 40 MG tablet  Commonly known as:  LIPITOR  Take 1 tablet (40 mg total) by  mouth daily at 6 PM.     CALCIUM 600+D 600-400 MG-UNIT tablet  Generic drug:  Calcium Carbonate-Vitamin D  Take 1 tablet by mouth daily.     carvedilol 6.25 MG tablet  Commonly known as:  COREG  Take 1 tablet (6.25 mg total) by mouth 2 (two) times daily.     clopidogrel 75 MG tablet  Commonly known as:  PLAVIX  Take 1 tablet (75 mg total) by mouth daily.     FISH OIL PO  Take 1 capsule by mouth daily.     Fluocinonide 0.1 % Crea  Apply 1 application topically 3 (three) times daily as needed (rash - on legs, hands and bottom).     furosemide 40 MG tablet  Commonly known as:  LASIX  Take 1 tablet (40 mg total) by mouth daily. X 3 days, then  discontinue     lisinopril 5 MG tablet  Commonly known as:  PRINIVIL,ZESTRIL  Take 1 tablet (5 mg total) by mouth daily.     Magnesium 400 MG Tabs  Take 400 mg by mouth daily.     multivitamin with minerals Tabs tablet  Take 1 tablet by mouth daily.     NITROSTAT 0.4 MG SL tablet  Generic drug:  nitroGLYCERIN  DISSOLVE 1 TABLET UNDER THE TONGUE EVERY 5 MINUTES AS NEEDED FOR CHEST PAIN     oxyCODONE 5 MG immediate release tablet  Commonly known as:  Oxy IR/ROXICODONE  Take 1-2 tablets (5-10 mg total) by mouth every 3 (three) hours as needed for severe pain.     spironolactone 25 MG tablet  Commonly known as:  ALDACTONE  TAKE ONE TABLET BY MOUTH AT BEDTIME       The patient has been discharged on:   1.Beta Blocker:  Yes [ x  ]                              No   [   ]                              If No, reason:  2.Ace Inhibitor/ARB: Yes [  x ]                                     No  [    ]                                     If No, reason:  3.Statin:   Yes [  x ]                  No  [   ]                  If No, reason:  4.Ecasa:  Yes  [  x ]                  No   [   ]                  If No, reason:   Follow Up Appointments: Follow-up Information    Follow up with Peter Martinique, MD On 09/14/2015.   Specialty:  Cardiology  Why:  Appointment is at 11:45   Contact information:   [address] [place] [address] [phone]       Follow up with Rexene Alberts, MD On 10/04/2015.   Specialty:  Cardiothoracic Surgery   Why:  PA/LAT CXR to be taken (at [facility] which is in the same building as Dr. Guy Sandifer office) on 10/04/2015 at 9:45 am;Appointment time is at 10:30 am   Contact information:   [place] [address] [phone]       Follow up with Velna Hatchet, MD.   Specialty:  Internal Medicine   Why:  Call for a follow up appointment regarding further surveillance of HGA1C 6 (pre diabetes)    Contact information:   [place] [address] [phone]       Signed: Dennie Vecchio HPA-C 09/07/2015, 8:12 AM

## 2015-09-06 NOTE — Progress Notes (Signed)
Pt ambulated 540 ft. With 3L O2. Os sats checked mid walk. Sats 95% on 3L. No signs of dyspnea. Will pass on to oncoming shift.  Raliegh Ip RN

## 2015-09-06 NOTE — Progress Notes (Signed)
UR Completed. Daley Gosse, RN, BSN.  [phone] 

## 2015-09-06 NOTE — Plan of Care (Signed)
Problem: Surgery Discharge Goal: Activity appropriate for discharge plan Outcome: Progressing Pt has ambulated in the hallways x 3 this shift. He has ambulated 109ft on room air. He has tolerated well without any complaints of shortness of breath. Oxygen saturation have been in the upper 90s on room air.

## 2015-09-06 NOTE — Discharge Instructions (Signed)
Activity: 1.May walk up steps °               2.No lifting more than ten pounds for four weeks.  °               3.No driving for four weeks. °               4.Stop any activity that causes chest pain, shortness of breath, dizziness, sweating or excessive weakness. °               5.Avoid straining. °               6.Continue with your breathing exercises daily. ° °Diet: Diabetic diet and Low fat, Low salt diet ° °Wound Care: May shower.  Clean wounds with mild soap and water daily. Contact the office at [phone] if any problems arise. ° °Coronary Artery Bypass Grafting, Care After °Refer to this sheet in the next few weeks. These instructions provide you with information on caring for yourself after your procedure. Your health care provider may also give you more specific instructions. Your treatment has been planned according to current medical practices, but problems sometimes occur. Call your health care provider if you have any problems or questions after your procedure. °WHAT TO EXPECT AFTER THE PROCEDURE °Recovery from surgery will be different for everyone. Some people feel well after 3 or 4 weeks, while for others it takes longer. After your procedure, it is typical to have the following: °· Nausea and a lack of appetite.   °· Constipation. °· Weakness and fatigue.   °· Depression or irritability.   °· Pain or discomfort at your incision site. °HOME CARE INSTRUCTIONS °· Take medicines only as directed by your health care provider. Do not stop taking medicines or start any new medicines without first checking with your health care provider. °· Take your pulse as directed by your health care provider. °· Perform deep breathing as directed by your health care provider. If you were given a device called an incentive spirometer, use it to practice deep breathing several times a day. Support your chest with a pillow or your arms when you take deep breaths or cough. °· Keep incision areas clean, dry, and  protected. Remove or change any bandages (dressings) only as directed by your health care provider. You may have skin adhesive strips over the incision areas. Do not take the strips off. They will fall off on their own. °· Check incision areas daily for any swelling, redness, or drainage. °· If incisions were made in your legs, do the following: °¨ Avoid crossing your legs.   °¨ Avoid sitting for long periods of time. Change positions every 30 minutes.   °¨ Elevate your legs when you are sitting. °· Wear compression stockings as directed by your health care provider. These stockings help keep blood clots from forming in your legs. °· Take showers once your health care provider approves. Until then, only take sponge baths. Pat incisions dry. Do not rub incisions with a washcloth or towel. Do not take baths, swim, or use a hot tub until your health care provider approves. °· Eat foods that are high in fiber, such as raw fruits and vegetables, whole grains, beans, and nuts. Meats should be lean cut. Avoid canned, processed, and fried foods. °· Drink enough fluid to keep your urine clear or pale yellow. °· Weigh yourself every day. This helps identify if you are retaining fluid that may make your heart and lungs   work harder. °· Rest and limit activity as directed by your health care provider. You may be instructed to: °¨ Stop any activity at once if you have chest pain, shortness of breath, irregular heartbeats, or dizziness. Get help right away if you have any of these symptoms. °¨ Move around frequently for short periods or take short walks as directed by your health care provider. Increase your activities gradually. You may need physical therapy or cardiac rehabilitation to help strengthen your muscles and build your endurance. °¨ Avoid lifting, pushing, or pulling anything heavier than 10 lb (4.5 kg) for at least 6 weeks after surgery. °· Do not drive until your health care provider approves.  °· Ask your health  care provider when you may return to work. °· Ask your health care provider when you may resume sexual activity. °· Keep all follow-up visits as directed by your health care provider. This is important. °SEEK MEDICAL CARE IF: °· You have swelling, redness, increasing pain, or drainage at the site of an incision. °· You have a fever. °· You have swelling in your ankles or legs. °· You have pain in your legs.   °· You gain 2 or more pounds (0.9 kg) a day. °· You are nauseous or vomit. °· You have diarrhea.  °SEEK IMMEDIATE MEDICAL CARE IF: °· You have chest pain that goes to your jaw or arms. °· You have shortness of breath.   °· You have a fast or irregular heartbeat.   °· You notice a "clicking" in your breastbone (sternum) when you move.   °· You have numbness or weakness in your arms or legs. °· You feel dizzy or light-headed.   °MAKE SURE YOU: °· Understand these instructions. °· Will watch your condition. °· Will get help right away if you are not doing well or get worse. °Document Released: 06/09/2005 Document Revised: 04/06/2014 Document Reviewed: 04/29/2013 °Exit[employer]® Patient Information ©2015 [employer]. This information is not intended to replace advice given to you by your health care provider. Make sure you discuss any questions you have with your health care provider. ° ° ° °

## 2015-09-07 LAB — BASIC METABOLIC PANEL
Anion gap: 6 (ref 5–15)
BUN: 15 mg/dL (ref 6–20)
CALCIUM: 9 mg/dL (ref 8.9–10.3)
CHLORIDE: 103 mmol/L (ref 101–111)
CO2: 28 mmol/L (ref 22–32)
CREATININE: 0.9 mg/dL (ref 0.61–1.24)
Glucose, Bld: 135 mg/dL — ABNORMAL HIGH (ref 65–99)
Potassium: 4.3 mmol/L (ref 3.5–5.1)
SODIUM: 137 mmol/L (ref 135–145)

## 2015-09-07 LAB — CK TOTAL AND CKMB (NOT AT ARMC)
CK, MB: 3.6 ng/mL (ref 0.5–5.0)
Relative Index: INVALID (ref 0.0–2.5)
Total CK: 76 U/L (ref 49–397)

## 2015-09-07 LAB — TROPONIN I: TROPONIN I: 0.21 ng/mL — AB (ref <0.031)

## 2015-09-07 MED ORDER — ALPRAZOLAM 0.25 MG PO TABS: 0.2500 mg | ORAL_TABLET | Freq: Three times a day (TID) | ORAL | Status: DC | PRN

## 2015-09-07 MED ORDER — OXYCODONE HCL 5 MG PO TABS: 5.0000 mg | ORAL_TABLET | ORAL | Status: DC | PRN

## 2015-09-07 MED ORDER — FUROSEMIDE 40 MG PO TABS: 40.0000 mg | ORAL_TABLET | Freq: Every day | ORAL | Status: DC

## 2015-09-07 NOTE — Progress Notes (Signed)
[id number] Pt up in room packing. Education completed with pt who voiced understanding. Referring to [place] Phase 2. Pt did not want to watch discharge video. Discussed ex ed and activity restrictions. Pt follows heart healthy diet. Graylon Good RN BSN 09/07/2015 9:44 AM

## 2015-09-07 NOTE — Progress Notes (Signed)
       [address]       [place] [address]             [phone]          5 Days Post-Op Procedure(s) (LRB): CORONARY ARTERY BYPASS GRAFTING (CABG) x 2 (LIMA-LAD, SVG-Intermediate) ENDOSCOPIC GREATER SAPHENOUS VEIN HARVEST RIGHT THIGH (N/A) TRANSESOPHAGEAL ECHOCARDIOGRAM (TEE) (N/A)  Subjective: Feels well, no complaints.  Bowels working.   Objective: Vital signs in last 24 hours: Patient Vitals for the past 24 hrs:  BP Temp Temp src Pulse Resp SpO2 Weight  09/07/15 0430 108/60 mmHg 98.1 F (36.7 C) Oral 84 18 94 % 205 lb 4 oz (93.1 kg)  09/06/15 2135 (!) 97/52 mmHg 99.3 F (37.4 C) Oral 91 18 96 % -  09/06/15 1346 (!) 101/53 mmHg 97.7 F (36.5 C) Oral 93 18 96 % -  09/06/15 0954 110/61 mmHg 98.2 F (36.8 C) Oral 85 14 95 % -   Current Weight  09/07/15 205 lb 4 oz (93.1 kg)  BASELINE WEIGHT:94.3 kg   Intake/Output from previous day: 10/03 0701 - 10/04 0700 In: 1080 [P.O.:1080] Out: 250 [Urine:250]    PHYSICAL EXAM:  Heart: RRR Lungs: Clear Wound: Clean and dry Extremities: Trace LE edema    Lab Results: CBC: Recent Labs  09/05/15 0256  WBC 14.3*  HGB 9.5*  HCT 28.0*  PLT 135*   BMET:  Recent Labs  09/05/15 0256 09/06/15 0542  NA 132* 138  K 4.7 4.3  CL 98* 103  CO2 28 29  GLUCOSE 113* 107*  BUN 16 17  CREATININE 0.89 0.90  CALCIUM 8.1* 8.5*    PT/INR: No results for input(s): LABPROT, INR in the last 72 hours.    Assessment/Plan: S/P Procedure(s) (LRB): CORONARY ARTERY BYPASS GRAFTING (CABG) x 2 (LIMA-LAD, SVG-Intermediate) ENDOSCOPIC GREATER SAPHENOUS VEIN HARVEST RIGHT THIGH (N/A) TRANSESOPHAGEAL ECHOCARDIOGRAM (TEE) (N/A) Doing well overall. Plan d/c home today- instructions reviewed with patient.   LOS: 5 days    Jozelyn Kuwahara H 09/07/2015

## 2015-09-07 NOTE — Progress Notes (Signed)
I removed sutures from chest tube site at 1030. Steri strips applied at sites. Pt tolerated well. No redness or drainage noted at sites. Edges well approximated. I reviewed discharge instructions with pt. He verbalized understanding. Telemetry was discontinued. Pt discharged to home, escorted by friend.

## 2015-09-07 NOTE — Care Management Note (Addendum)
Case Management Note  Patient Details  Name: Kerry Mason MRN: [id number] Date of Birth: [birth year]  Subjective/Objective:    Pt lives alone but dtr plans to take pt to her home in [facility] when he is medically stable for discharge, and will provide 24/7 assistance for 7-10 days.  If pt continues to need assistance after that time, states he has a cousin that he can stay with for another week.  Pt has active Medicaid and has PCP listed on face sheet.                 Expected Discharge Plan:  Home/Self Care  Discharge planning Services  CM Consult  Status of Service:  Complete, will sign off  Disposition:  Home with self care   Additonal Comments CM assessed pt, pt stated he was independent at home however he will move in with his daughter temporarily post discharge, daughter will provide 24 hour supervision as recommended.  NO CM needs prior to discharge.  Maryclare Labrador, RN 09/07/2015, 8:40 AM

## 2015-09-09 ENCOUNTER — Other Ambulatory Visit: Payer: Self-pay

## 2015-09-09 MED ORDER — OXYCODONE HCL 5 MG PO TABS: 5.0000 mg | ORAL_TABLET | ORAL | Status: DC | PRN

## 2015-09-09 NOTE — Telephone Encounter (Signed)
Refill for Oxycodone 5 mg #40 no refill printed out. Patient will pick RX Friday afternoon.

## 2015-09-14 ENCOUNTER — Encounter: Payer: Self-pay | Admitting: Cardiology

## 2015-09-14 ENCOUNTER — Ambulatory Visit (INDEPENDENT_AMBULATORY_CARE_PROVIDER_SITE_OTHER): Payer: Medicaid Other | Admitting: Cardiology

## 2015-09-14 VITALS — BP 110/72 | HR 85 | Ht 73.0 in | Wt 199.0 lb

## 2015-09-14 DIAGNOSIS — Z23 Encounter for immunization: Secondary | ICD-10-CM

## 2015-09-14 DIAGNOSIS — I10 Essential (primary) hypertension: Secondary | ICD-10-CM | POA: Diagnosis not present

## 2015-09-14 DIAGNOSIS — I255 Ischemic cardiomyopathy: Secondary | ICD-10-CM | POA: Diagnosis not present

## 2015-09-14 DIAGNOSIS — I2510 Atherosclerotic heart disease of native coronary artery without angina pectoris: Secondary | ICD-10-CM | POA: Diagnosis not present

## 2015-09-14 DIAGNOSIS — E785 Hyperlipidemia, unspecified: Secondary | ICD-10-CM

## 2015-09-14 DIAGNOSIS — I5022 Chronic systolic (congestive) heart failure: Secondary | ICD-10-CM | POA: Diagnosis not present

## 2015-09-14 DIAGNOSIS — Z9861 Coronary angioplasty status: Secondary | ICD-10-CM

## 2015-09-14 NOTE — Progress Notes (Signed)
Kerry Mason Date of Birth: Oct 25, 1956 Medical Record #[id number]  History of Present Illness: Kerry Mason is seen for follow up s/p CABG.  He has known CAD with remote stenting of the RCA in 2000 and then lost to follow up. Had anterior STEMI in January of 2015 - with cath showing occlusion of the proximal RCA with collaterals and occlusion of the proximal LAD. RCA occlusion felt to be chronic. EF 35% by cath and 30% by echo. Proximal and mid to distal LAD were stented with DES.  He  had a subsequent ICD implanted. Other issues include tobacco abuse gout, HLD, and HTN. In December 2105 he reported  increased SOB and chest pain. Myoview study was high risk with large area of anterior, septal and apical scar with inferoapical ischemia. EF 29%. He underwent repeat cardiac cath that showed the stents in the LAD were widely patent. The first diagonal had an 80% stenosis. The RCA was occluded proximally with left to right collaterals. Due to his refractory anginal symptoms he underwent CTO PCI on 02/03/15. His procedure was successful with excellent angiographic result but he did have a complication of perforation of the mid RCA. This resulted in cardiac tamponade and required emergent pericardiocentesis with removal of about a liter of blood. The perforation was sealed with a Graftmaster covered stent in the mid RCA. The remainder of the RCA was stented with 4 additional DES stents.  He did have resultant pericarditis. He was admitted in July  with a NSTEMI.  Ecg showed no acute change with old anterior infarct. Troponin peaked at 2.82. He underwent cardiac cath that showed occlusion of the mid RCA with left to right collaterals. He was treated medically with the addition of Imdur.  On medical therapy he continued to be symptomatic. He underwent repeat cath evaluation with FFR of the left main 50% stenosis which was abnormal.  He was referred for CABG which was done by Dr. Roxy Manns on 09/02/15. This included a  LIMA to the LAD and SVG to the ramus intermediate. The RCA was not bypassed due to small target and there was also significant scarring from prior pericarditis. His post op course was uncomplicated. He was noted to have COPD.  On follow up today he is doing well. Some chest wall soreness. Swelling at vein graft harvest site. No angina. Still gets SOB fairly easily. Daughter notes he still smokes some.    Current Outpatient Prescriptions  Medication Sig Dispense Refill  . acetaminophen (TYLENOL) 325 MG tablet Take 2 tablets (650 mg total) by mouth every 4 (four) hours as needed for headache or mild pain.    Marland Kitchen allopurinol (ZYLOPRIM) 300 MG tablet TAKE ONE TABLET BY MOUTH ONCE DAILY 30 tablet 3  . ALPRAZolam (XANAX) 0.25 MG tablet Take 1 tablet (0.25 mg total) by mouth 3 (three) times daily as needed for anxiety or sleep. 20 tablet 0  . aspirin EC 81 MG tablet Take 81 mg by mouth daily.    Marland Kitchen atorvastatin (LIPITOR) 40 MG tablet Take 1 tablet (40 mg total) by mouth daily at 6 PM. 30 tablet 11  . Calcium Carbonate-Vitamin D (CALCIUM 600+D) 600-400 MG-UNIT per tablet Take 1 tablet by mouth daily.    . carvedilol (COREG) 6.25 MG tablet Take 1 tablet (6.25 mg total) by mouth 2 (two) times daily. 60 tablet 11  . Fluocinonide 0.1 % CREA Apply 1 application topically 3 (three) times daily as needed (rash - on legs, hands and bottom).    Marland Kitchen  lisinopril (PRINIVIL,ZESTRIL) 5 MG tablet Take 1 tablet (5 mg total) by mouth daily. 30 tablet 11  . Magnesium 400 MG TABS Take 400 mg by mouth daily.    . Multiple Vitamin (MULTIVITAMIN WITH MINERALS) TABS tablet Take 1 tablet by mouth daily.    Marland Kitchen NITROSTAT 0.4 MG SL tablet DISSOLVE 1 TABLET UNDER THE TONGUE EVERY 5 MINUTES AS NEEDED FOR CHEST PAIN 25 tablet 8  . Omega-3 Fatty Acids (FISH OIL PO) Take 1 capsule by mouth daily.    Marland Kitchen oxyCODONE (OXY IR/ROXICODONE) 5 MG immediate release tablet Take 1-2 tablets (5-10 mg total) by mouth every 4 (four) hours as needed for severe  pain. 40 tablet 0  . spironolactone (ALDACTONE) 25 MG tablet TAKE ONE TABLET BY MOUTH AT BEDTIME 90 tablet 2   No current facility-administered medications for this visit.    No Known Allergies  Past Medical History  Diagnosis Date  . CAD (coronary artery disease)     a. 2000: s/p stent of RCA 2000 with BMS  b. 2015: STEMI s/p LHC with old occlusion of RCA and DESx2 to LAD  c. 07/11/49: complicated PCI on 3/2 for CTO of mid RCA with coronary perforation and cardiac tamponade requiring emergent pericardiocentesis     . Tobacco abuse   . Obesity   . HTN (hypertension)   . Hyperlipidemia   . Acute systolic CHF (congestive heart failure) ([place])   . Ischemic cardiomyopathy     a. 2D ECHO: EF 25-30%. Akinesis of the anteroseptal and  . AICD (automatic cardioverter/defibrillator) present   . Anxiety   . Cardiac tamponade     a. 02/03/15 2/2 coronary perforation during CTO procedure. Sealed with graftmaster coated stent.   . Gout   . Left main coronary artery disease   . Myocardial infarction ([place])   . Shortness of breath dyspnea   . History of pneumonia   . Urinary frequency   . Basal cell carcinoma     left arm  . S/P CABG x 2 09/02/2015    LIMA to LAD, SVG to ramus intermediate branch, EVH via right thigh     Past Surgical History  Procedure Laterality Date  . Cardiac catheterization    . Coronary angioplasty  2000  . Coronary angioplasty with stent placement  2015  . Left heart catheterization with coronary angiogram N/A 12/11/2013    Procedure: LEFT HEART CATHETERIZATION WITH CORONARY ANGIOGRAM;  Surgeon: Heith Haigler M Martinique, MD;  Location: [facility] CATH LAB;  Service: Cardiovascular;  Laterality: N/A;  . Percutaneous coronary stent intervention (pci-s)  12/11/2013    Procedure: PERCUTANEOUS CORONARY STENT INTERVENTION (PCI-S);  Surgeon: Jayden Kratochvil M Martinique, MD;  Location: [facility] CATH LAB;  Service: Cardiovascular;;  prov LAD and mid LAD  . Implantable cardioverter defibrillator implant N/A 04/17/2014     Procedure: IMPLANTABLE CARDIOVERTER DEFIBRILLATOR IMPLANT;  Surgeon: Evans Lance, MD;  Location: [facility] CATH LAB;  Service: Cardiovascular;  Laterality: N/A;  . Left heart catheterization with coronary angiogram N/A 01/05/2015    Procedure: LEFT HEART CATHETERIZATION WITH CORONARY ANGIOGRAM;  Surgeon: Lezlie Ritchey M Martinique, MD;  Location: [facility] CATH LAB;  Service: Cardiovascular;  Laterality: N/A;  . Percutaneous coronary stent intervention (pci-s) N/A 02/03/2015    Procedure: PERCUTANEOUS CORONARY STENT INTERVENTION (PCI-S);  Surgeon: Jettie Booze, MD;  Location: [facility] CATH LAB;  Service: Cardiovascular;  Laterality: N/A;  . Cardiac catheterization N/A 06/14/2015    Procedure: Left Heart Cath and Coronary Angiography;  Surgeon: Lorretta Harp, MD;  Location: [place] CV LAB;  Service: Cardiovascular;  Laterality: N/A;  . Cardiac catheterization N/A 08/18/2015    Procedure: Intravascular Pressure Wire/FFR Study;  Surgeon: Aviana Shevlin M Martinique, MD;  Location: [place] CV LAB;  Service: Cardiovascular;  Laterality: N/A;  . Foot surgery    . Colonoscopy    . Coronary artery bypass graft N/A 09/02/2015    Procedure: CORONARY ARTERY BYPASS GRAFTING (CABG) x 2 (LIMA-LAD, SVG-Intermediate) ENDOSCOPIC GREATER SAPHENOUS VEIN HARVEST RIGHT THIGH;  Surgeon: Rexene Alberts, MD;  Location: [place];  Service: Open Heart Surgery;  Laterality: N/A;  . Tee without cardioversion N/A 09/02/2015    Procedure: TRANSESOPHAGEAL ECHOCARDIOGRAM (TEE);  Surgeon: Rexene Alberts, MD;  Location: [place];  Service: Open Heart Surgery;  Laterality: N/A;    History  Smoking status  . Former Smoker -- 1.00 packs/day for 35 years  . Types: Cigarettes  . Quit date: 12/11/2013  Smokeless tobacco  . Not on file    Comment: pt. states that brother and friends smoke in home    History  Alcohol Use No    Family History  Problem Relation Age of Onset  . CAD Father     PTCA  . Cancer Mother     LYMPHOMA    Review of Systems: The  review of systems is per the HPI.  All other systems were reviewed and are negative.  Physical Exam: BP 110/72 mmHg  Pulse 85  Ht 6\' 1"  (1.854 m)  Wt 90.266 kg (199 lb)  BMI 26.26 kg/m2 Patient is very pleasant and in no acute distress. Skin is warm and dry. Color is normal.  HEENT is unremarkable. Normocephalic/atraumatic. PERRL. Sclera are nonicteric. Neck is supple. No masses. No JVD. ICD site has healed well. Lungs are clear. Cardiac exam shows a regular rate and rhythm. normal S1-2. No gallop or murmur. Abdomen is soft. Extremities are without edema. Gait and ROM are intact. No gross neurologic deficits noted.  Wt Readings from Last 3 Encounters:  09/14/15 90.266 kg (199 lb)  09/07/15 93.1 kg (205 lb 4 oz)  08/31/15 94.575 kg (208 lb 8 oz)    LABORATORY DATA/PROCEDURES:  Lab Results  Component Value Date   WBC 14.3* 09/05/2015   HGB 9.5* 09/05/2015   HCT 28.0* 09/05/2015   PLT 135* 09/05/2015   GLUCOSE 135* 09/07/2015   CHOL 91 06/14/2015   TRIG 77 06/14/2015   HDL 26* 06/14/2015   LDLCALC 50 06/14/2015   ALT 12* 08/31/2015   AST 19 08/31/2015   NA 137 09/07/2015   K 4.3 09/07/2015   CL 103 09/07/2015   CREATININE 0.90 09/07/2015   BUN 15 09/07/2015   CO2 28 09/07/2015   TSH 1.265 12/11/2013   INR 1.41 09/02/2015   HGBA1C 6.0* 08/31/2015    BNP (last 3 results) No results for input(s): PROBNP in the last 8760 hours. Lab Results  Component Value Date   CKTOTAL 76 09/07/2015   CKMB 3.6 09/07/2015   TROPONINI 0.21* 09/07/2015    Ecg today shows NSR  Rate 85. Old anteroseptal infarct. Inferolateral T wave abnormality consistent with ischemia. I have personally reviewed and interpreted this study.    Assessment / Plan: 1. CAD s/p anterior STEMI with extensive LAD stenting. Chronic occlusion of RCA. S/p successful CTO PCI of the RCA with multiple DES and Graftmaster stent for perforation in March. NSTEMI in July related to reocclusion of RCA. Refractory angina  on medical therapy. Re-evaluation of left main 50% stenosis  with abnormal FFR. Now s/p CABG x 2. Making a good recovery. Unfortunately RCA could not be bypassed. Will treat medically.  Recommend smoking cessation. Continue ASA. Doesn't really need to take plavix.   2. S/p coronary perforation with hemopericardium and cardiac tamponade during CTO PCI. S/p successful pericardiocentesis and drain. Resolved. Pericarditis due to irritation of blood. Resolved.   3. Paroxysmal Afib due to acute pericarditis. Resolved on amiodarone. No recurrence post CABG  4. Chronic Systolic heart failure -EF 30%.  Will continue current Coreg, lisinopril, and aldactone Rx. Will hold lasix for now. He appears to be at baseline volume status. Continue sodium restriction. If he has increased weight or swelling may need to resume lasix.    5. Underlying ICD -  continue beta blocker. No significant arrhythmia noted on ICD interrogation   6. Hyperlipidemia. Well controlled on Pravachol.   7. Tobacco abuse- needs complete cessation.  I will follow up in 3 months. OK'd to return to work 6 weeks post op.

## 2015-09-14 NOTE — Patient Instructions (Addendum)
Stop Plavix and lasix  Continue your other therapy  No smoking  I will see you in 3 months

## 2015-09-15 ENCOUNTER — Other Ambulatory Visit: Payer: Self-pay | Admitting: *Deleted

## 2015-09-15 DIAGNOSIS — G8918 Other acute postprocedural pain: Secondary | ICD-10-CM

## 2015-09-15 MED ORDER — OXYCODONE HCL 5 MG PO TABS: 5.0000 mg | ORAL_TABLET | ORAL | Status: DC | PRN

## 2015-09-15 NOTE — Telephone Encounter (Signed)
Kerry Mason has called requesting a refill for Oxycodone and Xanax s/p CABG X 2 09/02/15.  I said a new signed script would be available at the office today for the Oxycodone.  I told him the Xanax was prescribed for only a very short time.  If he felt he still needed this medication he would need to see his PCP and he understood.

## 2015-09-16 ENCOUNTER — Telehealth: Payer: Self-pay

## 2015-09-16 NOTE — Telephone Encounter (Signed)
Received a call from patient he stated he woke up this morning with chest congested coughing thick grayish phlegm.Advised to see PCP.

## 2015-09-28 ENCOUNTER — Other Ambulatory Visit: Payer: Self-pay

## 2015-09-28 DIAGNOSIS — G8918 Other acute postprocedural pain: Secondary | ICD-10-CM

## 2015-09-28 MED ORDER — OXYCODONE HCL 5 MG PO TABS: 5.0000 mg | ORAL_TABLET | ORAL | Status: DC | PRN

## 2015-09-28 NOTE — Telephone Encounter (Signed)
RX refill for Oxycodone 5 mg given #40 no refill

## 2015-10-01 ENCOUNTER — Other Ambulatory Visit: Payer: Self-pay | Admitting: Thoracic Surgery (Cardiothoracic Vascular Surgery)

## 2015-10-01 DIAGNOSIS — Z951 Presence of aortocoronary bypass graft: Secondary | ICD-10-CM

## 2015-10-04 ENCOUNTER — Encounter: Payer: Self-pay | Admitting: Thoracic Surgery (Cardiothoracic Vascular Surgery)

## 2015-10-04 ENCOUNTER — Ambulatory Visit (INDEPENDENT_AMBULATORY_CARE_PROVIDER_SITE_OTHER): Payer: Self-pay | Admitting: Thoracic Surgery (Cardiothoracic Vascular Surgery)

## 2015-10-04 ENCOUNTER — Ambulatory Visit
Admission: RE | Admit: 2015-10-04 | Discharge: 2015-10-04 | Disposition: A | Discharge status: Home / Self Care | Payer: Medicaid Other | Source: Ambulatory Visit | Attending: Thoracic Surgery (Cardiothoracic Vascular Surgery) | Admitting: Thoracic Surgery (Cardiothoracic Vascular Surgery)

## 2015-10-04 VITALS — BP 118/79 | HR 76 | Resp 16 | Ht 73.0 in | Wt 196.0 lb

## 2015-10-04 DIAGNOSIS — Z951 Presence of aortocoronary bypass graft: Secondary | ICD-10-CM

## 2015-10-04 DIAGNOSIS — Z9861 Coronary angioplasty status: Secondary | ICD-10-CM

## 2015-10-04 DIAGNOSIS — I2510 Atherosclerotic heart disease of native coronary artery without angina pectoris: Secondary | ICD-10-CM

## 2015-10-04 NOTE — Progress Notes (Signed)
[address]       [name] [address]             [phone]     CARDIOTHORACIC SURGERY OFFICE NOTE  Referring Provider is Martinique, Peter M, MD PCP is Velna Hatchet, MD   HPI:  Patient presents to the office today for routine follow-up status post coronary artery bypass grafting 2 on 09/02/2015 for left main disease with three-vessel coronary artery disease and ischemic cardiomyopathy with chronic combined systolic and diastolic congestive heart failure.  His postoperative recovery was uncomplicated and he was discharged home on the fifth postoperative day. Since hospital discharge the patient has continued to do very well.  He was seen in follow-up by Dr. Martinique on 09/14/2015 and he returns to our office for routine follow-up today. He states that he still has some mild residual soreness in his chest but this has dramatically improved over the last week or so. He now only uses pain medications at night to help him rest. He has not had shortness of breath and he reports that his exercise tolerance is good. He no longer feels palpitations and chest discomfort as he experienced prior to surgery. His appetite is good. He is sleeping well at night. He wants to go back to work.   Current Outpatient Prescriptions  Medication Sig Dispense Refill  . acetaminophen (TYLENOL) 325 MG tablet Take 2 tablets (650 mg total) by mouth every 4 (four) hours as needed for headache or mild pain.    Marland Kitchen allopurinol (ZYLOPRIM) 300 MG tablet TAKE ONE TABLET BY MOUTH ONCE DAILY 30 tablet 3  . aspirin EC 81 MG tablet Take 81 mg by mouth daily.    Marland Kitchen atorvastatin (LIPITOR) 40 MG tablet Take 1 tablet (40 mg total) by mouth daily at 6 PM. 30 tablet 11  . Calcium Carbonate-Vitamin D (CALCIUM 600+D) 600-400 MG-UNIT per tablet Take 1 tablet by mouth daily.    . carvedilol (COREG) 6.25 MG tablet Take 1 tablet (6.25 mg total) by mouth 2 (two) times daily. 60 tablet 11  . Fluocinonide 0.1 % CREA Apply  1 application topically 3 (three) times daily as needed (rash - on legs, hands and bottom).    Marland Kitchen lisinopril (PRINIVIL,ZESTRIL) 5 MG tablet Take 1 tablet (5 mg total) by mouth daily. 30 tablet 11  . Magnesium 400 MG TABS Take 400 mg by mouth daily.    Marland Kitchen NITROSTAT 0.4 MG SL tablet DISSOLVE 1 TABLET UNDER THE TONGUE EVERY 5 MINUTES AS NEEDED FOR CHEST PAIN 25 tablet 8  . Omega-3 Fatty Acids (FISH OIL PO) Take 1 capsule by mouth daily.    Marland Kitchen oxyCODONE (OXY IR/ROXICODONE) 5 MG immediate release tablet Take 1-2 tablets (5-10 mg total) by mouth every 4 (four) hours as needed for severe pain. 40 tablet 0  . spironolactone (ALDACTONE) 25 MG tablet TAKE ONE TABLET BY MOUTH AT BEDTIME 90 tablet 2  . Multiple Vitamin (MULTIVITAMIN WITH MINERALS) TABS tablet Take 1 tablet by mouth daily.     No current facility-administered medications for this visit.      Physical Exam:   BP 118/79 mmHg  Pulse 76  Resp 16  Ht 6\' 1"  (1.854 Mason)  Wt 196 lb (88.905 kg)  BMI 25.86 kg/m2  SpO2 97%  General:  Well-appearing  Chest:   Clear  CV:   Regular rate and rhythm without murmur  Incisions:  Healing nicely, sternum is stable  Abdomen:  Soft and nontender  Extremities:  Warm and well-perfused  Diagnostic Tests:  CHEST 2 VIEW  COMPARISON: 09/06/2015  FINDINGS: There is no focal parenchymal opacity. There is no pleural effusion or pneumothorax. The heart and mediastinal contours are unremarkable. There is evidence of prior CABG. There is a single lead cardiac pacemaker. The osseous structures are unremarkable.  IMPRESSION: No active cardiopulmonary disease.   Electronically Signed  By: Kathreen Devoid  On: 10/04/2015 10:34   Impression:  Patient is doing very well approximately one month status post coronary artery bypass grafting 2.  Plan:  I have encouraged the patient to continue to gradually increase his physical activity as tolerated with his primary limitation remaining that he  refrain from heavy lifting or strenuous use of his arms or shoulders for at least 2 more months. I think he may resume driving an automobile. I've encouraged him to enroll and participate in the outpatient cardiac rehabilitation program. We have not recommended any changes to his current medications at this time.   Valentina Gu. Roxy Manns, MD 10/04/2015 10:58 AM

## 2015-10-04 NOTE — Patient Instructions (Signed)
Continue all previous medications without any changes at this time  Continue to avoid any heavy lifting or strenuous use of your arms or shoulders for at least a total of three months from the time of surgery.  After three months you may gradually increase how much you lift or otherwise use your arms or chest as tolerated, with limits based upon whether or not activities lead to the return of significant discomfort.  You may return to driving an automobile as long as you are no longer requiring oral narcotic pain relievers during the daytime.  It would be wise to start driving only short distances during the daylight and gradually increase from there as you feel comfortable.  You are encouraged to enroll and participate in the outpatient cardiac rehab program beginning as soon as practical.  

## 2015-10-05 ENCOUNTER — Encounter: Payer: Self-pay | Admitting: *Deleted

## 2015-10-05 DIAGNOSIS — Z006 Encounter for examination for normal comparison and control in clinical research program: Secondary | ICD-10-CM

## 2015-10-05 NOTE — Progress Notes (Signed)
Called Kerry Mason for his 30 day follow-up for the LEVO-CTS trial. He states he has been doing well and has had no hospitalization or adverse events. I will follow-up again in 90 days.

## 2015-10-08 ENCOUNTER — Other Ambulatory Visit: Payer: Self-pay | Admitting: Cardiology

## 2015-10-20 ENCOUNTER — Telehealth: Payer: Self-pay | Admitting: Cardiology

## 2015-10-20 MED ORDER — CARVEDILOL 6.25 MG PO TABS: 6.2500 mg | ORAL_TABLET | Freq: Two times a day (BID) | ORAL | Status: DC

## 2015-10-20 NOTE — Telephone Encounter (Signed)
Med dose clarified, pt voiced understanding.

## 2015-10-20 NOTE — Telephone Encounter (Signed)
Kerry Mason is calling because he wants to know should he take two pills ( Carvedilol 6.25) in the morning and two in the evening or should he take the one in the morning and one in the evening . Please call to clarify

## 2015-10-21 ENCOUNTER — Encounter (HOSPITAL_COMMUNITY)
Admission: RE | Admit: 2015-10-21 | Discharge: 2015-10-21 | Disposition: A | Discharge status: Home / Self Care | Payer: Medicaid Other | Source: Ambulatory Visit | Attending: Cardiology | Admitting: Cardiology

## 2015-10-21 DIAGNOSIS — Z9861 Coronary angioplasty status: Secondary | ICD-10-CM | POA: Insufficient documentation

## 2015-10-21 NOTE — Progress Notes (Signed)
Cardiac Rehab Medication Review by a Pharmacist  Does the patient  feel that his/her medications are working for him/her?  yes  Has the patient been experiencing any side effects to the medications prescribed?  no  Does the patient measure his/her own blood pressure or blood glucose at home?  yes   Does the patient have any problems obtaining medications due to transportation or finances?   no  Understanding of regimen: excellent Understanding of indications: excellent Potential of compliance: excellent    Pharmacist comments:  Asked about herbal medications and possibility of using herbals to get off all medications. Discussed potential risks of starting herbal medications, namely lack of evidence in heart disease, no FDA regulation for dose and manufacturing standardization, and possible drug-drug interactions with current medications.  Pt also expressed concerns of being on lisinopril. Regularly checks BP and notes to be consistently in 90s however is asymptomatic. Discussed blood pressure lowering effects of medications and indication and benefits of medications. Pt denies being symptomatic, no dizziness, feeling faint. Will continue meds and monitor and report to MD if symptomatic hypotension occurs. Lastly, pt also noted 3 lb wt gain in past 2 days, was instructed by Dr. Martinique to take furosemide 40 PRN 2-3 lb wt gain. Measures daily weights in AM, will take 40 mg today. Next appt w/ Dr. Martinique on 12/24/15. Was counseled to schedule appt sooner if wt continues to rise or if needing to use PRN furosemide more frequently.      Wynelle Fanny 10/21/2015 9:42 AM

## 2015-10-25 ENCOUNTER — Encounter (HOSPITAL_COMMUNITY): Payer: Medicaid Other

## 2015-10-27 ENCOUNTER — Encounter (HOSPITAL_COMMUNITY): Payer: Medicaid Other

## 2015-11-01 ENCOUNTER — Encounter (HOSPITAL_COMMUNITY): Payer: Self-pay

## 2015-11-01 ENCOUNTER — Encounter: Payer: Self-pay | Admitting: *Deleted

## 2015-11-01 ENCOUNTER — Encounter (HOSPITAL_COMMUNITY)
Admission: RE | Admit: 2015-11-01 | Discharge: 2015-11-01 | Disposition: A | Discharge status: Home / Self Care | Payer: Medicaid Other | Source: Ambulatory Visit | Attending: Cardiology | Admitting: Cardiology

## 2015-11-01 DIAGNOSIS — Z9861 Coronary angioplasty status: Secondary | ICD-10-CM | POA: Diagnosis present

## 2015-11-01 LAB — GLUCOSE, CAPILLARY: GLUCOSE-CAPILLARY: 91 mg/dL (ref 65–99)

## 2015-11-01 NOTE — Progress Notes (Signed)
Pt started cardiac rehab today.  Pt tolerated light exercise without difficulty. VSS, telemetry-sinus rhythm, asymptomatic.  Medication list reconciled.  Pt verbalized compliance with medications and denies barriers to compliance. Pt does admit to smoking occasionally, a "few" cigarettes per day. Pt counseled on importance of tobacco cessation.  Pt advised to contact 1800quitnow.  PSYCHOSOCIAL ASSESSMENT:  PHQ-0. Pt exhibits positive coping skills, hopeful outlook with supportive family. No psychosocial needs identified at this time, no psychosocial interventions necessary.   Pt indicated interest in vocational rehab counselor on his admission paperwork.  Vocational rehab packet given to patient to complete.   Pt enjoys outdoor activities such as biking and kayaking.   Pt cardiac rehab  goal is  to increase strength, stamina and energy as well as build endurance.  Pt encouraged to incorporate home exercise activities in addition to cardiac rehab  to increase ability to achieve these goals. Pt oriented to exercise equipment and routine.  Understanding verbalized.

## 2015-11-03 ENCOUNTER — Encounter (HOSPITAL_COMMUNITY)
Admission: RE | Admit: 2015-11-03 | Discharge: 2015-11-03 | Disposition: A | Discharge status: Home / Self Care | Payer: Medicaid Other | Source: Ambulatory Visit | Attending: Cardiology | Admitting: Cardiology

## 2015-11-03 DIAGNOSIS — Z9861 Coronary angioplasty status: Secondary | ICD-10-CM | POA: Diagnosis not present

## 2015-11-05 ENCOUNTER — Encounter (HOSPITAL_COMMUNITY)
Admission: RE | Admit: 2015-11-05 | Discharge: 2015-11-05 | Disposition: A | Discharge status: Home / Self Care | Payer: Medicaid Other | Source: Ambulatory Visit | Attending: Cardiology | Admitting: Cardiology

## 2015-11-05 DIAGNOSIS — Z9861 Coronary angioplasty status: Secondary | ICD-10-CM | POA: Diagnosis not present

## 2015-11-05 LAB — GLUCOSE, CAPILLARY: Glucose-Capillary: 86 mg/dL (ref 65–99)

## 2015-11-05 NOTE — Progress Notes (Signed)
Pt returning to work full time and would like to switch to the 6:45 class due to work schedule. QUALITY OF LIFE SCORE REVIEW Patient completed quality of life survey as a participant in cardiac rehab.  Pt scored the following Overall 28.08, Health and Function 27.86, socioeconomic 28.07, Psychological and spiritual 27.43 and Family 30.00.  Scores less than 21 are considered low.  Pt scored well above the low threshold.  Pt with supportive family, previous participant in cardiac rehab.  Will continue to monitor and intervene as necessary.  Cherre Huger, BSN

## 2015-11-08 ENCOUNTER — Encounter (HOSPITAL_COMMUNITY): Payer: Medicaid Other

## 2015-11-10 ENCOUNTER — Encounter (HOSPITAL_COMMUNITY): Admission: RE | Admit: 2015-11-10 | Payer: Medicaid Other | Source: Ambulatory Visit

## 2015-11-10 ENCOUNTER — Encounter (HOSPITAL_COMMUNITY)
Admission: RE | Admit: 2015-11-10 | Discharge: 2015-11-10 | Disposition: A | Discharge status: Home / Self Care | Payer: Medicaid Other | Source: Ambulatory Visit | Attending: Cardiology | Admitting: Cardiology

## 2015-11-10 ENCOUNTER — Encounter (HOSPITAL_COMMUNITY): Payer: Medicaid Other

## 2015-11-10 DIAGNOSIS — Z9861 Coronary angioplasty status: Secondary | ICD-10-CM | POA: Diagnosis not present

## 2015-11-12 ENCOUNTER — Encounter (HOSPITAL_COMMUNITY): Payer: Medicaid Other

## 2015-11-15 ENCOUNTER — Telehealth: Payer: Self-pay

## 2015-11-15 ENCOUNTER — Encounter (HOSPITAL_COMMUNITY): Payer: Medicaid Other

## 2015-11-15 ENCOUNTER — Encounter (HOSPITAL_COMMUNITY)
Admission: RE | Admit: 2015-11-15 | Discharge: 2015-11-15 | Disposition: A | Discharge status: Home / Self Care | Payer: Medicaid Other | Source: Ambulatory Visit | Attending: Cardiology | Admitting: Cardiology

## 2015-11-15 DIAGNOSIS — Z9861 Coronary angioplasty status: Secondary | ICD-10-CM | POA: Diagnosis not present

## 2015-11-15 DIAGNOSIS — I5022 Chronic systolic (congestive) heart failure: Secondary | ICD-10-CM

## 2015-11-15 MED ORDER — FUROSEMIDE 20 MG PO TABS: 20.0000 mg | ORAL_TABLET | Freq: Every day | ORAL | Status: DC

## 2015-11-15 NOTE — Progress Notes (Signed)
On today pt weight was elevated to 95.7 kg - 210.5 lbs. (noted pt missed on Friday because his weight was elevated and he felt "bad").   Pt weight first day was 93.4 kg 205.4 and he has been as low as 92.1kg -202.6 Pt remembered you advised him to take a diuretic prn for weight gain - Furosemide 20mg  - didn't see KCL replacement looks like you d/c'd this some months back because of stabe K levels.  Pt took the 29 mg tablet and began that on Friday.  Pt thought back to his eating habits and had onion soup and pizza earlier in the week with his grandson.  Pt noted that even with the extra 20 mg he really didn't urinate any more than usual which he thought was odd.  Pt also has some constipation and plans to take some Miralax for this.  Pt lungs clear, bilateral. Some swelling pt notes in his toes, slight shortness of breath.  Pt questions whether he should resume the lasix daily or as needed. Unfortunately pt did not take this until his weight was up over 5 pounds  and if the 20 mg dose is appropriate for him at this point.  Pt also asked about KCL replacement whether he should be back on this as well.  Pt has returned to work full time 4 x week and averages attendance to rehab about 2 x week. Pt will return on Wednesday. Will send inbasket to dr. Martinique for advisement. Cherre Huger, BSN

## 2015-11-15 NOTE — Progress Notes (Signed)
Reviewed home exercise with pt today.  Pt plans to walk for exercise.  Reviewed THR, pulse, RPE, sign and symptoms, and when to call 911 or MD.  Pt voiced understanding.    Bonnetta Allbee,MS, ACSM RCEP  

## 2015-11-15 NOTE — Telephone Encounter (Signed)
Received a message from [facility].He advised may restart Lasix 20 mg daily.Bmet in 2 weeks.Lab order mailed to patient.

## 2015-11-17 ENCOUNTER — Encounter (HOSPITAL_COMMUNITY)
Admission: RE | Admit: 2015-11-17 | Discharge: 2015-11-17 | Disposition: A | Discharge status: Home / Self Care | Payer: Medicaid Other | Source: Ambulatory Visit | Attending: Cardiology | Admitting: Cardiology

## 2015-11-17 ENCOUNTER — Encounter (HOSPITAL_COMMUNITY): Payer: Medicaid Other

## 2015-11-17 DIAGNOSIS — Z9861 Coronary angioplasty status: Secondary | ICD-10-CM | POA: Diagnosis not present

## 2015-11-19 ENCOUNTER — Encounter (HOSPITAL_COMMUNITY): Payer: Medicaid Other

## 2015-11-22 ENCOUNTER — Encounter (HOSPITAL_COMMUNITY): Payer: Medicaid Other

## 2015-11-24 ENCOUNTER — Encounter (HOSPITAL_COMMUNITY): Payer: Medicaid Other

## 2015-11-26 ENCOUNTER — Encounter (HOSPITAL_COMMUNITY): Payer: Medicaid Other

## 2015-11-30 ENCOUNTER — Encounter: Payer: Self-pay | Admitting: *Deleted

## 2015-11-30 DIAGNOSIS — Z006 Encounter for examination for normal comparison and control in clinical research program: Secondary | ICD-10-CM

## 2015-11-30 NOTE — Progress Notes (Signed)
Called Kerry Mason for his final LEVO-CTS follow-up. He states he has been doing well. He has had no additional hospitalizations.

## 2015-12-01 ENCOUNTER — Encounter (HOSPITAL_COMMUNITY): Payer: Medicaid Other

## 2015-12-03 ENCOUNTER — Encounter (HOSPITAL_COMMUNITY): Payer: Medicaid Other

## 2015-12-08 ENCOUNTER — Encounter (HOSPITAL_COMMUNITY): Payer: Medicaid Other

## 2015-12-09 ENCOUNTER — Telehealth (HOSPITAL_COMMUNITY): Payer: Self-pay | Admitting: *Deleted

## 2015-12-09 NOTE — Telephone Encounter (Signed)
Pt absent from exercise for several sessions. Message left for pt to return call and plans for returning back to rehab.  Contact information provided. Cherre Huger, BSN

## 2015-12-10 ENCOUNTER — Encounter (HOSPITAL_COMMUNITY): Payer: Medicaid Other

## 2015-12-10 ENCOUNTER — Encounter (HOSPITAL_COMMUNITY): Admission: RE | Admit: 2015-12-10 | Payer: Medicaid Other | Source: Ambulatory Visit

## 2015-12-13 ENCOUNTER — Encounter (HOSPITAL_COMMUNITY): Payer: Medicaid Other

## 2015-12-13 ENCOUNTER — Encounter (HOSPITAL_COMMUNITY): Admission: RE | Admit: 2015-12-13 | Payer: Medicaid Other | Source: Ambulatory Visit

## 2015-12-15 ENCOUNTER — Encounter (HOSPITAL_COMMUNITY): Payer: Medicaid Other

## 2015-12-15 ENCOUNTER — Encounter (HOSPITAL_COMMUNITY)
Admission: RE | Admit: 2015-12-15 | Discharge: 2015-12-15 | Disposition: A | Discharge status: Home / Self Care | Payer: Medicaid Other | Source: Ambulatory Visit | Attending: Cardiology | Admitting: Cardiology

## 2015-12-15 DIAGNOSIS — Z9861 Coronary angioplasty status: Secondary | ICD-10-CM | POA: Insufficient documentation

## 2015-12-17 ENCOUNTER — Encounter (HOSPITAL_COMMUNITY)
Admission: RE | Admit: 2015-12-17 | Discharge: 2015-12-17 | Disposition: A | Discharge status: Home / Self Care | Payer: Medicaid Other | Source: Ambulatory Visit | Attending: Cardiology | Admitting: Cardiology

## 2015-12-17 ENCOUNTER — Encounter (HOSPITAL_COMMUNITY): Payer: Medicaid Other

## 2015-12-17 DIAGNOSIS — Z9861 Coronary angioplasty status: Secondary | ICD-10-CM | POA: Diagnosis not present

## 2015-12-20 ENCOUNTER — Encounter (HOSPITAL_COMMUNITY): Payer: Medicaid Other

## 2015-12-22 ENCOUNTER — Encounter (HOSPITAL_COMMUNITY)
Admission: RE | Admit: 2015-12-22 | Discharge: 2015-12-22 | Disposition: A | Discharge status: Home / Self Care | Payer: Medicaid Other | Source: Ambulatory Visit | Attending: Cardiology | Admitting: Cardiology

## 2015-12-22 ENCOUNTER — Encounter (HOSPITAL_COMMUNITY): Payer: Medicaid Other

## 2015-12-22 DIAGNOSIS — Z9861 Coronary angioplasty status: Secondary | ICD-10-CM | POA: Diagnosis not present

## 2015-12-22 NOTE — Progress Notes (Signed)
Pt returns to exercise after absence.  Pt away in [place].  Pt missed on Monday due to oversleeping.  Pt reported to rehab staff that on Saturday he felt he was in afib.  Heart rate 130's.  Pt has felt this before when he was in afib and was sure he was.  Pt did not call the on call MD.  Pt took one amiodarone on Saturday evening and return to beating right Sunday afternoon.  Pt presently in SR. Pt tolerated exercise with no complaints.  Pt will see Dr. Martinique in the office on Friday morning.   In basket message sent and acknowledge by  Dr. Martinique. Cherre Huger, BSN

## 2015-12-24 ENCOUNTER — Encounter (HOSPITAL_COMMUNITY)
Admission: RE | Admit: 2015-12-24 | Discharge: 2015-12-24 | Disposition: A | Discharge status: Home / Self Care | Payer: Medicaid Other | Source: Ambulatory Visit | Attending: Cardiology | Admitting: Cardiology

## 2015-12-24 ENCOUNTER — Encounter (HOSPITAL_COMMUNITY): Payer: Medicaid Other

## 2015-12-24 ENCOUNTER — Ambulatory Visit (INDEPENDENT_AMBULATORY_CARE_PROVIDER_SITE_OTHER): Payer: Medicaid Other | Admitting: Cardiology

## 2015-12-24 ENCOUNTER — Encounter: Payer: Self-pay | Admitting: Cardiology

## 2015-12-24 VITALS — BP 118/70 | HR 72 | Ht 73.0 in | Wt 212.9 lb

## 2015-12-24 DIAGNOSIS — I2510 Atherosclerotic heart disease of native coronary artery without angina pectoris: Secondary | ICD-10-CM

## 2015-12-24 DIAGNOSIS — Z951 Presence of aortocoronary bypass graft: Secondary | ICD-10-CM

## 2015-12-24 DIAGNOSIS — Z95810 Presence of automatic (implantable) cardiac defibrillator: Secondary | ICD-10-CM | POA: Diagnosis not present

## 2015-12-24 DIAGNOSIS — I5022 Chronic systolic (congestive) heart failure: Secondary | ICD-10-CM | POA: Diagnosis not present

## 2015-12-24 DIAGNOSIS — I10 Essential (primary) hypertension: Secondary | ICD-10-CM | POA: Diagnosis not present

## 2015-12-24 DIAGNOSIS — I480 Paroxysmal atrial fibrillation: Secondary | ICD-10-CM

## 2015-12-24 DIAGNOSIS — Z9861 Coronary angioplasty status: Secondary | ICD-10-CM | POA: Diagnosis not present

## 2015-12-24 LAB — LIPID PANEL
Cholesterol: 112 mg/dL — ABNORMAL LOW (ref 125–200)
HDL: 33 mg/dL — AB (ref >=40)
LDL CALC: 63 mg/dL (ref <130)
TRIGLYCERIDES: 79 mg/dL (ref <150)
Total CHOL/HDL Ratio: 3.4 Ratio (ref <=5.0)
VLDL: 16 mg/dL (ref <30)

## 2015-12-24 LAB — BASIC METABOLIC PANEL
BUN: 14 mg/dL (ref 7–25)
CHLORIDE: 105 mmol/L (ref 98–110)
CO2: 28 mmol/L (ref 20–31)
CREATININE: 0.93 mg/dL (ref 0.70–1.33)
Calcium: 9.2 mg/dL (ref 8.6–10.3)
Glucose, Bld: 88 mg/dL (ref 65–99)
Potassium: 4.8 mmol/L (ref 3.5–5.3)
SODIUM: 137 mmol/L (ref 135–146)

## 2015-12-24 LAB — HEPATIC FUNCTION PANEL
ALBUMIN: 4.3 g/dL (ref 3.6–5.1)
ALK PHOS: 55 U/L (ref 40–115)
ALT: 11 U/L (ref 9–46)
AST: 20 U/L (ref 10–35)
Bilirubin, Direct: 0.1 mg/dL (ref <=0.2)
Indirect Bilirubin: 0.5 mg/dL (ref 0.2–1.2)
TOTAL PROTEIN: 7.9 g/dL (ref 6.1–8.1)
Total Bilirubin: 0.6 mg/dL (ref 0.2–1.2)

## 2015-12-24 LAB — CBC WITH DIFFERENTIAL/PLATELET
BASOS PCT: 1 % (ref 0–1)
Basophils Absolute: 0.1 10*3/uL (ref 0.0–0.1)
EOS ABS: 0.4 10*3/uL (ref 0.0–0.7)
Eosinophils Relative: 4 % (ref 0–5)
HCT: 46.6 % (ref 39.0–52.0)
Hemoglobin: 16 g/dL (ref 13.0–17.0)
Lymphocytes Relative: 38 % (ref 12–46)
Lymphs Abs: 3.8 10*3/uL (ref 0.7–4.0)
MCH: 29.4 pg (ref 26.0–34.0)
MCHC: 34.3 g/dL (ref 30.0–36.0)
MCV: 85.7 fL (ref 78.0–100.0)
MONO ABS: 0.6 10*3/uL (ref 0.1–1.0)
MONOS PCT: 6 % (ref 3–12)
MPV: 8.9 fL (ref 8.6–12.4)
NEUTROS PCT: 51 % (ref 43–77)
Neutro Abs: 5 10*3/uL (ref 1.7–7.7)
PLATELETS: 241 10*3/uL (ref 150–400)
RBC: 5.44 MIL/uL (ref 4.22–5.81)
RDW: 14.8 % (ref 11.5–15.5)
WBC: 9.9 10*3/uL (ref 4.0–10.5)

## 2015-12-24 LAB — TSH: TSH: 1.254 u[IU]/mL (ref 0.350–4.500)

## 2015-12-24 MED ORDER — TADALAFIL 10 MG PO TABS: 10.0000 mg | ORAL_TABLET | Freq: Every day | ORAL | Status: DC | PRN

## 2015-12-24 NOTE — Patient Instructions (Signed)
We will check lab work today  Continue your current therapy  I will see you in 6 months.   

## 2015-12-24 NOTE — Progress Notes (Signed)
Kerry [facility] Date of Birth: [birth year] Medical Record [id number]  History of Present Illness: [facility] is seen for follow up s/p CABG.  He has known CAD with remote stenting of the RCA in 2000 and then lost to follow up. Had anterior STEMI in January of 2015 - with cath showing occlusion of the proximal RCA with collaterals and occlusion of the proximal LAD. RCA occlusion felt to be chronic. EF 35% by cath and 30% by echo. Proximal and mid to distal LAD were stented with DES.  He  had a subsequent ICD implanted. Other issues include tobacco abuse gout, HLD, and HTN. In December 2105 he reported  increased SOB and chest pain. Myoview study was high risk with large area of anterior, septal and apical scar with inferoapical ischemia. EF 29%. He underwent repeat cardiac cath that showed the stents in the LAD were widely patent. The first diagonal had an 80% stenosis. The RCA was occluded proximally with left to right collaterals. Due to his refractory anginal symptoms he underwent CTO PCI on 02/03/15. His procedure was successful with excellent angiographic result but he did have a complication of perforation of the mid RCA. This resulted in cardiac tamponade and required emergent pericardiocentesis with removal of about a liter of blood. The perforation was sealed with a Graftmaster covered stent in the mid RCA. The remainder of the RCA was stented with 4 additional DES stents.  He did have resultant pericarditis. He was admitted in July  with a NSTEMI.  Ecg showed no acute change with old anterior infarct. Troponin peaked at 2.82. He underwent cardiac cath that showed occlusion of the mid RCA with left to right collaterals. He was treated medically with the addition of Imdur.  On medical therapy he continued to be symptomatic. He underwent repeat cath evaluation with FFR of the left main 50% stenosis which was abnormal.  He was referred for CABG which was done by Dr. Roxy Manns on 09/02/15. This included a  LIMA to the LAD and SVG to the ramus intermediate. The RCA was not bypassed due to small target and there was also significant scarring from prior pericarditis. His post op course was uncomplicated. He was noted to have COPD.  On follow up today he is doing well. He is still exercising at cardiac Rehab. No chest pain or SOB. Notes he does well during the day but loses energy at night. Denies tobacco use. Weight has gone up but he denies any edema or PND. Notes occasional skip in heart beat. Last Saturday night he felt his heart racing while lying in bed. HR went up to 130 then resolved. Overall he feels well.    Current Outpatient Prescriptions  Medication Sig Dispense Refill  . acetaminophen (TYLENOL) 325 MG tablet Take 2 tablets (650 mg total) by mouth every 4 (four) hours as needed for headache or mild pain.    Marland Kitchen allopurinol (ZYLOPRIM) 300 MG tablet TAKE ONE TABLET BY MOUTH ONCE DAILY 30 tablet 3  . aspirin EC 81 MG tablet Take 81 mg by mouth daily.    Marland Kitchen atorvastatin (LIPITOR) 40 MG tablet Take 1 tablet (40 mg total) by mouth daily at 6 PM. 30 tablet 11  . Calcium Carbonate-Vitamin D (CALCIUM 600+D) 600-400 MG-UNIT per tablet Take 1 tablet by mouth daily.    . carvedilol (COREG) 6.25 MG tablet Take 1 tablet (6.25 mg total) by mouth 2 (two) times daily with a meal. 180 tablet 3  . Fluocinonide 0.1 % CREA  Apply 1 application topically 3 (three) times daily as needed (rash - on legs, hands and bottom).    . furosemide (LASIX) 20 MG tablet Take 1 tablet (20 mg total) by mouth daily. 30 tablet 6  . lisinopril (PRINIVIL,ZESTRIL) 5 MG tablet Take 1 tablet (5 mg total) by mouth daily. 30 tablet 11  . Magnesium 400 MG TABS Take 400 mg by mouth daily.    . Multiple Vitamin (MULTIVITAMIN WITH MINERALS) TABS tablet Take 1 tablet by mouth daily.    Marland Kitchen NITROSTAT 0.4 MG SL tablet DISSOLVE 1 TABLET UNDER THE TONGUE EVERY 5 MINUTES AS NEEDED FOR CHEST PAIN 25 tablet 8  . Omega-3 Fatty Acids (FISH OIL PO) Take 1  capsule by mouth daily.    Marland Kitchen oxyCODONE (OXY IR/ROXICODONE) 5 MG immediate release tablet Take 1-2 tablets (5-10 mg total) by mouth every 4 (four) hours as needed for severe pain. 40 tablet 0  . spironolactone (ALDACTONE) 25 MG tablet TAKE ONE TABLET BY MOUTH AT BEDTIME 90 tablet 2  . tadalafil (CIALIS) 10 MG tablet Take 1 tablet (10 mg total) by mouth daily as needed for erectile dysfunction. 10 tablet 3   No current facility-administered medications for this visit.    No Known Allergies  Past Medical History  Diagnosis Date  . CAD (coronary artery disease)     a. 2000: s/p stent of RCA 2000 with BMS  b. 2015: STEMI s/p LHC with old occlusion of RCA and DESx2 to LAD  c. 123456: complicated PCI on 3/2 for CTO of mid RCA with coronary perforation and cardiac tamponade requiring emergent pericardiocentesis     . Tobacco abuse   . Obesity   . HTN (hypertension)   . Hyperlipidemia   . Acute systolic CHF (congestive heart failure) ([place])   . Ischemic cardiomyopathy     a. 2D ECHO: EF 25-30%. Akinesis of the anteroseptal and  . AICD (automatic cardioverter/defibrillator) present   . Anxiety   . Cardiac tamponade     a. 02/03/15 2/2 coronary perforation during CTO procedure. Sealed with graftmaster coated stent.   . Gout   . Left main coronary artery disease   . Myocardial infarction ([place])   . Shortness of breath dyspnea   . History of pneumonia   . Urinary frequency   . Basal cell carcinoma     left arm  . S/P CABG x 2 09/02/2015    LIMA to LAD, SVG to ramus intermediate branch, EVH via right thigh     Past Surgical History  Procedure Laterality Date  . Cardiac catheterization    . Coronary angioplasty  2000  . Coronary angioplasty with stent placement  2015  . Left heart catheterization with coronary angiogram N/A 12/11/2013    Procedure: LEFT HEART CATHETERIZATION WITH CORONARY ANGIOGRAM;  Surgeon: Aily Tzeng M Martinique, MD;  Location[facility] CATH LAB;  Service: Cardiovascular;  Laterality: N/A;   . Percutaneous coronary stent intervention (pci-s)  12/11/2013    Procedure: PERCUTANEOUS CORONARY STENT INTERVENTION (PCI-S);  Surgeon: Lareta Bruneau M Martinique, MD;  Location: [facility] CATH LAB;  Service: Cardiovascular;;  prov LAD and mid LAD  . Implantable cardioverter defibrillator implant N/A 04/17/2014    Procedure: IMPLANTABLE CARDIOVERTER DEFIBRILLATOR IMPLANT;  Surgeon: Evans Lance, MD;  Location: [facility] CATH LAB;  Service: Cardiovascular;  Laterality: N/A;  . Left heart catheterization with coronary angiogram N/A 01/05/2015    Procedure: LEFT HEART CATHETERIZATION WITH CORONARY ANGIOGRAM;  Surgeon: Janaiah Vetrano M Martinique, MD;  Location: [facility] CATH LAB;  Service: Cardiovascular;  Laterality: N/A;  . Percutaneous coronary stent intervention (pci-s) N/A 02/03/2015    Procedure: PERCUTANEOUS CORONARY STENT INTERVENTION (PCI-S);  Surgeon: Jettie Booze, MD;  Location: [facility] CATH LAB;  Service: Cardiovascular;  Laterality: N/A;  . Cardiac catheterization N/A 06/14/2015    Procedure: Left Heart Cath and Coronary Angiography;  Surgeon: Lorretta Harp, MD;  Location: [place] CV LAB;  Service: Cardiovascular;  Laterality: N/A;  . Cardiac catheterization N/A 08/18/2015    Procedure: Intravascular Pressure Wire/FFR Study;  Surgeon: Marcello Tuzzolino M Martinique, MD;  Location: [place] CV LAB;  Service: Cardiovascular;  Laterality: N/A;  . Foot surgery    . Colonoscopy    . Coronary artery bypass graft N/A 09/02/2015    Procedure: CORONARY ARTERY BYPASS GRAFTING (CABG) x 2 (LIMA-LAD, SVG-Intermediate) ENDOSCOPIC GREATER SAPHENOUS VEIN HARVEST RIGHT THIGH;  Surgeon: Rexene Alberts, MD;  Location: [place];  Service: Open Heart Surgery;  Laterality: N/A;  . Tee without cardioversion N/A 09/02/2015    Procedure: TRANSESOPHAGEAL ECHOCARDIOGRAM (TEE);  Surgeon: Rexene Alberts, MD;  Location: [place];  Service: Open Heart Surgery;  Laterality: N/A;    History  Smoking status  . Former Smoker -- 1.00 packs/day for 35 years  . Types: Cigarettes   . Quit date: 12/11/2013  Smokeless tobacco  . Not on file    Comment: pt. states that brother and friends smoke in home    History  Alcohol Use No    Family History  Problem Relation Age of Onset  . CAD Father     PTCA  . Cancer Mother     LYMPHOMA    Review of Systems: The review of systems is per the HPI.  All other systems were reviewed and are negative.  Physical Exam: BP 118/70 mmHg  Pulse 72  Ht 6\' 1"  (1.854 m)  Wt 96.571 kg (212 lb 14.4 oz)  BMI 28.09 kg/m2 Patient is very pleasant and in no acute distress. Skin is warm and dry. Color is normal.  HEENT is unremarkable. Normocephalic/atraumatic. PERRL. Sclera are nonicteric. Neck is supple. No masses. No JVD. ICD site has healed well. Lungs are clear. Cardiac exam shows a regular rate and rhythm. normal S1-2. No gallop or murmur. Abdomen is soft. Extremities are without edema. Gait and ROM are intact. No gross neurologic deficits noted.  Wt Readings from Last 3 Encounters:  12/24/15 96.571 kg (212 lb 14.4 oz)  10/21/15 94 kg (207 lb 3.7 oz)  10/04/15 88.905 kg (196 lb)    LABORATORY DATA/PROCEDURES:  Lab Results  Component Value Date   WBC 14.3* 09/05/2015   HGB 9.5* 09/05/2015   HCT 28.0* 09/05/2015   PLT 135* 09/05/2015   GLUCOSE 135* 09/07/2015   CHOL 91 06/14/2015   TRIG 77 06/14/2015   HDL 26* 06/14/2015   LDLCALC 50 06/14/2015   ALT 12* 08/31/2015   AST 19 08/31/2015   NA 137 09/07/2015   K 4.3 09/07/2015   CL 103 09/07/2015   CREATININE 0.90 09/07/2015   BUN 15 09/07/2015   CO2 28 09/07/2015   TSH 1.265 12/11/2013   INR 1.41 09/02/2015   HGBA1C 6.0* 08/31/2015    BNP (last 3 results) No results for input(s): PROBNP in the last 8760 hours. Lab Results  Component Value Date   CKTOTAL 76 09/07/2015   CKMB 3.6 09/07/2015   TROPONINI 0.21* 09/07/2015    Ecg today shows NSR  Rate 71. Old anteroseptal infarct. Inferolateral T wave abnormality consistent with  ischemia. I have personally  reviewed and interpreted this study.    Assessment / Plan: 1. CAD s/p anterior STEMI with extensive LAD stenting. Chronic occlusion of RCA. S/p successful CTO PCI of the RCA with multiple DES and Graftmaster stent for perforation in March 2016. NSTEMI in July related to reocclusion of RCA. Refractory angina on medical therapy. Re-evaluation of left main 50% stenosis with abnormal FFR. Now s/p CABG x 2 in September 2016.  Unfortunately RCA could not be bypassed. Will treat medically.  Clinically doing very well. Recommend continued smoking cessation. Continue ASA. Continue exercise.  2. S/p coronary perforation with hemopericardium and cardiac tamponade during CTO PCI. S/p successful pericardiocentesis and drain. Resolved. Pericarditis due to irritation of blood. Resolved.   3. Paroxysmal Afib due to acute pericarditis. No recurrence post CABG. ? If recent episode was Afib. If he does have occurrence may need long term anticoagulation.   4. Chronic Systolic heart failure -EF 30%.  Will continue current Coreg, lisinopril, lasix, and aldactone Rx.  He appears to be at baseline volume status. Continue sodium restriction.   5. Underlying ICD -  continue beta blocker. No significant arrhythmia noted on ICD interrogation   6. Hyperlipidemia. Well controlled on Pravachol.   7. Tobacco abuse- needs complete cessation.  I will check labs today including chemistries, CBC, and TSH. I will follow up in 6 months.

## 2015-12-27 ENCOUNTER — Encounter (HOSPITAL_COMMUNITY)
Admission: RE | Admit: 2015-12-27 | Discharge: 2015-12-27 | Disposition: A | Discharge status: Home / Self Care | Payer: Medicaid Other | Source: Ambulatory Visit | Attending: Cardiology | Admitting: Cardiology

## 2015-12-27 ENCOUNTER — Encounter (HOSPITAL_COMMUNITY): Payer: Medicaid Other

## 2015-12-27 DIAGNOSIS — Z9861 Coronary angioplasty status: Secondary | ICD-10-CM | POA: Diagnosis not present

## 2015-12-29 ENCOUNTER — Encounter (HOSPITAL_COMMUNITY): Payer: Medicaid Other

## 2015-12-31 ENCOUNTER — Encounter (HOSPITAL_COMMUNITY): Payer: Medicaid Other

## 2016-01-03 ENCOUNTER — Encounter (HOSPITAL_COMMUNITY): Payer: Medicaid Other

## 2016-01-05 ENCOUNTER — Encounter (HOSPITAL_COMMUNITY): Payer: Medicaid Other

## 2016-01-07 ENCOUNTER — Encounter: Payer: Self-pay | Admitting: Internal Medicine

## 2016-01-07 ENCOUNTER — Encounter: Payer: Self-pay | Admitting: Nurse Practitioner

## 2016-01-07 ENCOUNTER — Ambulatory Visit (INDEPENDENT_AMBULATORY_CARE_PROVIDER_SITE_OTHER): Payer: Medicaid Other | Admitting: Nurse Practitioner

## 2016-01-07 ENCOUNTER — Encounter (HOSPITAL_COMMUNITY): Payer: Medicaid Other

## 2016-01-07 VITALS — BP 90/60 | HR 71 | Ht 73.0 in | Wt 208.8 lb

## 2016-01-07 DIAGNOSIS — I5022 Chronic systolic (congestive) heart failure: Secondary | ICD-10-CM | POA: Diagnosis not present

## 2016-01-07 DIAGNOSIS — I255 Ischemic cardiomyopathy: Secondary | ICD-10-CM | POA: Diagnosis not present

## 2016-01-07 DIAGNOSIS — Z720 Tobacco use: Secondary | ICD-10-CM

## 2016-01-07 DIAGNOSIS — R002 Palpitations: Secondary | ICD-10-CM | POA: Diagnosis not present

## 2016-01-07 LAB — CUP PACEART INCLINIC DEVICE CHECK
Date Time Interrogation Session: 20170203141617
Implantable Lead Implant Date: 20150515
Implantable Lead Serial Number: 10579264
MDC IDC LEAD LOCATION: 753860
MDC IDC LEAD MODEL: 365500
MDC IDC PG SERIAL: 60800912

## 2016-01-07 NOTE — Patient Instructions (Addendum)
Medication Instructions:   Your physician recommends that you continue on your current medications as directed. Please refer to the Current Medication list given to you today.    If you need a refill on your cardiac medications before your next appointment, please call your pharmacy.  Labwork:  NONE ORDER TODAY    Testing/Procedures:  NONE ORDER TODAY    Follow-Up:    Remote monitoring is used to monitor your Pacemaker of ICD from home. This monitoring reduces the number of office visits required to check your device to one time per year. It allows [place] to keep an eye on the functioning of your device to ensure it is working properly. You are scheduled for a device check from home on . 04/05/16... You may send your transmission at any time that day. If you have a wireless device, the transmission will be sent automatically. After your physician reviews your transmission, you will receive a postcard with your next transmission date.   Your physician wants you to follow-up in: [place] will receive a reminder letter in the mail two months in advance. If you don't receive a letter, please call our office to schedule the follow-up appointment.

## 2016-01-07 NOTE — Progress Notes (Signed)
Electrophysiology Office Note Date: 01/07/2016  ID:  Kerry Mason, DOB 10-14-1956, MRN [id number]  PCP: Kerry Hatchet, MD Primary Cardiologist: Kerry Electrophysiologist: Lovena Mason  CC: Routine ICD follow-up  Kerry Mason is a 60 y.o. male seen today for Dr Lovena Mason.  He presents today for routine electrophysiology followup.  Since last being seen in our clinic, the patient reports doing very well. He denies chest pain,  dyspnea, PND, orthopnea, nausea, vomiting, dizziness, syncope, edema, weight gain, or early satiety.  He has not had ICD shocks. He has rare palpitations that are short lived and occasionally feel like skipped beats but other times are more tachycardia.   Device History: Biotronik single chamber ICD implanted 2015 for ICM History of appropriate therapy: No History of AAD therapy: No   Past Medical History  Diagnosis Date  . CAD (coronary artery disease)     a. 2000: s/p stent of RCA 2000 with BMS  b. 2015: STEMI s/p LHC with old occlusion of RCA and DESx2 to LAD  c. 123456: complicated PCI on 3/2 for CTO of mid RCA with coronary perforation and cardiac tamponade requiring emergent pericardiocentesis     . Tobacco abuse   . Obesity   . HTN (hypertension)   . Hyperlipidemia   . Acute systolic CHF (congestive heart failure) ([place])   . Ischemic cardiomyopathy     a. 2D ECHO: EF 25-30%. Akinesis of the anteroseptal and  . AICD (automatic cardioverter/defibrillator) present   . Anxiety   . Cardiac tamponade     a. 02/03/15 2/2 coronary perforation during CTO procedure. Sealed with graftmaster coated stent.   . Gout   . Left main coronary artery disease   . Myocardial infarction ([place])   . Shortness of breath dyspnea   . History of pneumonia   . Urinary frequency   . Basal cell carcinoma     left arm  . S/P CABG x 2 09/02/2015    LIMA to LAD, SVG to ramus intermediate branch, EVH via right thigh    Past Surgical History  Procedure Laterality Date  . Cardiac  catheterization    . Coronary angioplasty  2000  . Coronary angioplasty with stent placement  2015  . Left heart catheterization with coronary angiogram N/A 12/11/2013    Procedure: LEFT HEART CATHETERIZATION WITH CORONARY ANGIOGRAM;  Surgeon: Kerry M Martinique, MD;  Location: [facility] CATH LAB;  Service: Cardiovascular;  Laterality: N/A;  . Percutaneous coronary stent intervention (pci-s)  12/11/2013    Procedure: PERCUTANEOUS CORONARY STENT INTERVENTION (PCI-S);  Surgeon: Kerry M Martinique, MD;  Location: [facility] C[facility] LAB;  Service: Cardiovascular;;  prov LAD and mid LAD  . Implantable cardioverter defibrillator implant N/A 04/17/2014    Procedure: IMPLANTABLE CARDIOVERTER DEFIBRILLATOR IMPLANT;  Surgeon: Kerry Lance, MD;  Location: [facility] CATH LAB;  Service: Cardiovascular;  Laterality: N/A;  . Left heart catheterization with coronary angiogram N/A 01/05/2015    Procedure: LEFT HEART CATHETERIZATION WITH CORONARY ANGIOGRAM;  Surgeon: Kerry M Martinique, MD;  Location: [facility] CATH LAB;  Service: Cardiovascular;  Laterality: N/A;  . Percutaneous coronary stent intervention (pci-s) N/A 02/03/2015    Procedure: PERCUTANEOUS CORONARY STENT INTERVENTION (PCI-S);  Surgeon: Kerry Booze, MD;  Location: [facility] CATH LAB;  Service: Cardiovascular;  Laterality: N/A;  . Cardiac catheterization N/A 06/14/2015    Procedure: Left Heart Cath and Coronary Angiography;  Surgeon: Kerry Harp, MD;  Location: [place] CV LAB;  Service: Cardiovascular;  Laterality: N/A;  . Cardiac catheterization N/A 08/18/2015  Procedure: Intravascular Pressure Wire/FFR Study;  Surgeon: Kerry M Martinique, MD;  Location: [place] CV LAB;  Service: Cardiovascular;  Laterality: N/A;  . Foot surgery    . Colonoscopy    . Coronary artery bypass graft N/A 09/02/2015    Procedure: CORONARY ARTERY BYPASS GRAFTING (CABG) x 2 (LIMA-LAD, SVG-Intermediate) ENDOSCOPIC GREATER SAPHENOUS VEIN HARVEST RIGHT THIGH;  Surgeon: Kerry Alberts, MD;  Location: [place];  Service:  Open Heart Surgery;  Laterality: N/A;  . Tee without cardioversion N/A 09/02/2015    Procedure: TRANSESOPHAGEAL ECHOCARDIOGRAM (TEE);  Surgeon: Kerry Alberts, MD;  Location: [place];  Service: Open Heart Surgery;  Laterality: N/A;    Current Outpatient Prescriptions  Medication Sig Dispense Refill  . acetaminophen (TYLENOL) 325 MG tablet Take 2 tablets (650 mg total) by mouth every 4 (four) hours as needed for headache or mild pain.    Marland Kitchen allopurinol (ZYLOPRIM) 300 MG tablet TAKE ONE TABLET BY MOUTH ONCE DAILY 30 tablet 3  . aspirin EC 81 MG tablet Take 81 mg by mouth daily.    Marland Kitchen atorvastatin (LIPITOR) 40 MG tablet Take 1 tablet (40 mg total) by mouth daily at 6 PM. 30 tablet 11  . Calcium Carbonate-Vitamin D (CALCIUM 600+D) 600-400 MG-UNIT per tablet Take 1 tablet by mouth daily.    . carvedilol (COREG) 6.25 MG tablet Take 1 tablet (6.25 mg total) by mouth 2 (two) times daily with a meal. 180 tablet 3  . Fluocinonide 0.1 % CREA Apply 1 application topically 3 (three) times daily as needed (rash - on legs, hands and bottom).    . furosemide (LASIX) 20 MG tablet Take 1 tablet (20 mg total) by mouth daily. 30 tablet 6  . lisinopril (PRINIVIL,ZESTRIL) 5 MG tablet Take 1 tablet (5 mg total) by mouth daily. 30 tablet 11  . Magnesium 400 MG TABS Take 400 mg by mouth daily.    . Multiple Vitamin (MULTIVITAMIN WITH MINERALS) TABS tablet Take 1 tablet by mouth daily.    Marland Kitchen NITROSTAT 0.4 MG SL tablet DISSOLVE 1 TABLET UNDER THE TONGUE EVERY 5 MINUTES AS NEEDED FOR CHEST PAIN 25 tablet 8  . Omega-3 Fatty Acids (FISH OIL PO) Take 1 capsule by mouth daily.    Marland Kitchen spironolactone (ALDACTONE) 25 MG tablet TAKE ONE TABLET BY MOUTH AT BEDTIME 90 tablet 2  . tadalafil (CIALIS) 10 MG tablet Take 1 tablet (10 mg total) by mouth daily as needed for erectile dysfunction. 10 tablet 3   No current facility-administered medications for this visit.    Allergies:   Review of patient's allergies indicates no known  allergies.   Social History: Social History   Social History  . Marital Status: Married    Spouse Name: N/A  . Number of Children: 2  . 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 Smoking status: Former Smoker -- 1.00 packs/day for 35 years    Types: Cigarettes    Quit date: 12/11/2013  . Smokeless tobacco: Never Used     Comment: pt. states that brother and friends smoke in home  . Alcohol Use: No  . Drug Use: No  . Sexual Activity: Not Currently   Other Topics Concern  . Not on file   Social History Narrative    Family History: Family History  Problem Relation Age of Onset  . CAD Father     PTCA  . Cancer Mother  LYMPHOMA    Review of Systems: All other systems reviewed and are otherwise negative except as noted above.   Physical Exam: VS:  BP 90/60 mmHg  Pulse 71  Ht 6\' 1"  (1.854 m)  Wt 208 lb 12.8 oz (94.711 kg)  BMI 27.55 kg/m2 , BMI Body mass index is 27.55 kg/(m^2).  GEN- The patient is well appearing, alert and oriented x 3 today.   HEENT: normocephalic, atraumatic; sclera clear, conjunctiva pink; hearing intact; oropharynx clear; neck supple Lungs- Clear to ausculation bilaterally, normal work of breathing.  No wheezes, rales, rhonchi Heart- Regular rate and rhythm  GI- soft, non-tender, non-distended, bowel sounds present  Extremities- no clubbing, cyanosis, or edema; DP/PT/radial pulses 2+ bilaterally MS- no significant deformity or atrophy Skin- warm and dry, no rash or lesion; ICD pocket well healed Psych- euthymic mood, full affect Neuro- strength and sensation are intact  ICD interrogation- reviewed in detail today,  See PACEART report  EKG:  EKG is not ordered today.  Recent Labs: 09/03/2015: Magnesium 1.9 09/04/2015: B Natriuretic Peptide 677.1* 12/24/2015: ALT 11; BUN 14; Creat 0.93; Hemoglobin 16.0; Platelets 241; Potassium 4.8; Sodium 137; TSH 1.254   Wt Readings  from Last 3 Encounters:  01/07/16 208 lb 12.8 oz (94.711 kg)  12/24/15 212 lb 14.4 oz (96.571 kg)  10/21/15 207 lb 3.7 oz (94 kg)     Other studies Reviewed: Additional studies/ records that were reviewed today include: Dr Kerry and Dr Tanna Furry office notes   Assessment and Plan:  1.  Chronic systolic dysfunction euvolemic today Stable on an appropriate medical regimen Normal ICD function See Pace Art report No changes today  2.  ICM/CAD No recent ischemic symptoms Continue medical therapy  3.  Tobacco abuse Cessation advised  4.  Palpitations The patient has a single chamber ICD with atrial arrhythmias able to be detected. He has had no arrhythmias detected since march of 2016.  Atrial arrhythmia rate adjusted down to 140bpm today. Will follow remotely.    Current medicines are reviewed at length with the patient today.   The patient does not have concerns regarding his medicines.  The following changes were made today:  none  Labs/ tests ordered today include: none    Disposition:   Follow up with home monitoring, Dr Kerry as scheduled, Dr Lovena [facility] 1 year    Signed, Chanetta Marshall, NP 01/07/2016 12:20 PM  [facility] [address] [place] [address] [phone] (office) [phone] (fax)

## 2016-01-10 ENCOUNTER — Encounter (HOSPITAL_COMMUNITY)
Admission: RE | Admit: 2016-01-10 | Discharge: 2016-01-10 | Disposition: A | Discharge status: Home / Self Care | Payer: Medicaid Other | Source: Ambulatory Visit | Attending: Cardiology | Admitting: Cardiology

## 2016-01-10 ENCOUNTER — Encounter (HOSPITAL_COMMUNITY): Payer: Medicaid Other

## 2016-01-10 DIAGNOSIS — Z9861 Coronary angioplasty status: Secondary | ICD-10-CM | POA: Diagnosis present

## 2016-01-12 ENCOUNTER — Encounter (HOSPITAL_COMMUNITY): Payer: Medicaid Other

## 2016-01-14 ENCOUNTER — Encounter (HOSPITAL_COMMUNITY): Payer: Medicaid Other

## 2016-01-17 ENCOUNTER — Encounter (HOSPITAL_COMMUNITY): Payer: Medicaid Other

## 2016-01-19 ENCOUNTER — Encounter (HOSPITAL_COMMUNITY): Payer: Medicaid Other

## 2016-02-23 ENCOUNTER — Other Ambulatory Visit: Payer: Self-pay | Admitting: Physician Assistant

## 2016-02-24 NOTE — Telephone Encounter (Signed)
Rx(s) sent to pharmacy electronically.  

## 2016-03-22 ENCOUNTER — Other Ambulatory Visit: Payer: Self-pay | Admitting: Physician Assistant

## 2016-05-02 IMAGING — CR DG CHEST 1V PORT
1 series · 1 of 1 positions shown · non-contrast
Comparison: 08/31/2015

CLINICAL DATA: Status post CABG

EXAM:
PORTABLE CHEST 1 VIEW

[AP]
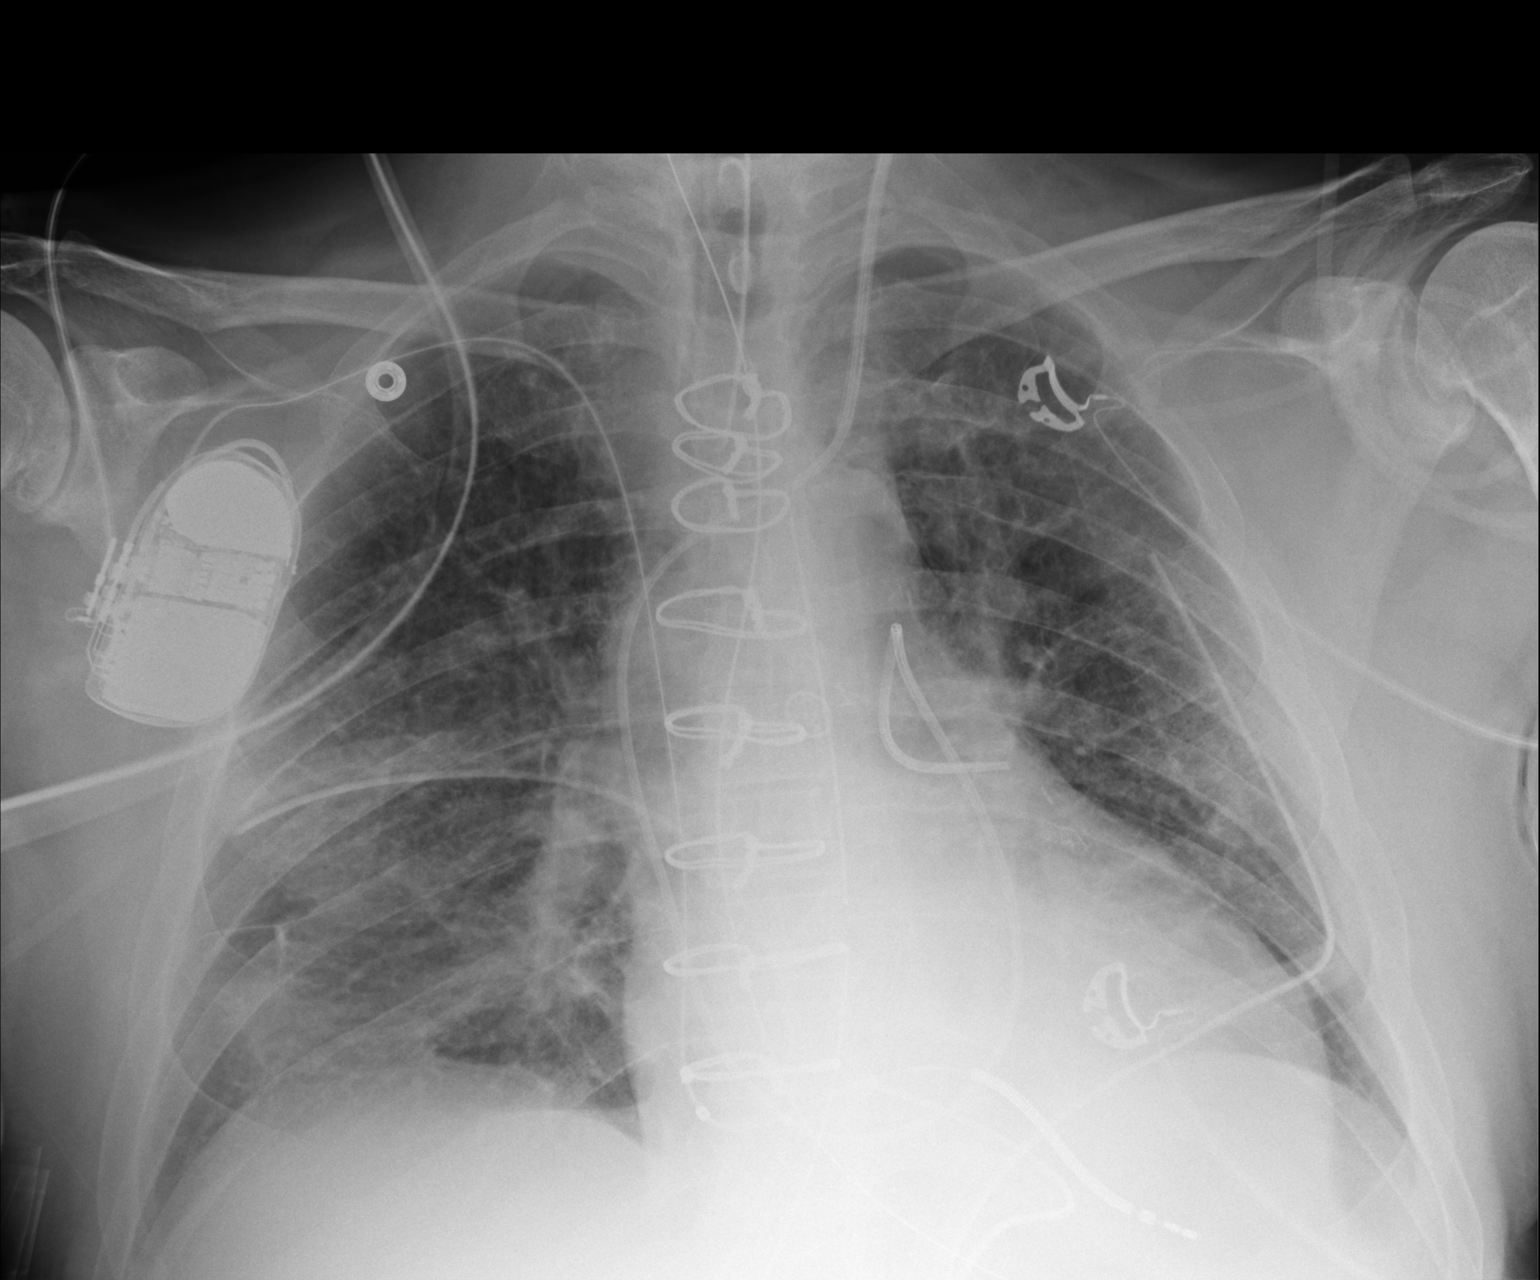

[1 of 1 positions shown; findings below may reference images not displayed]

FINDINGS: Interval CABG. Endotracheal tube with the tip 3.8 cm above the
carina. Nasogastric tube coursing below the diaphragm. Single lead
cardiac pacemaker. There is a Swan-Ganz catheter with the tip coiled
in the pulmonary outflow track. There is no pleural effusion or
pneumothorax.

There are bilateral chest tubes. There is no pneumothorax. There is
no significant pleural effusion. There is hazy right lower lung
airspace disease likely reflecting atelectasis.

There is stable cardiomegaly.  Interval median sternotomy.
IMPRESSION: 1. Endotracheal tube with the tip 3.8 cm above the carina.
2. Swan-Ganz catheter with the tip coiled in the pulmonary outflow
track.
3. Hazy right lower lung airspace disease likely reflecting
atelectasis. Attention on follow-up examination is recommended.

## 2016-05-05 IMAGING — CR DG CHEST 1V PORT
1 series · 1 of 1 positions shown · non-contrast
Comparison: 09/04/2015

CLINICAL DATA: Chest tube

EXAM:
PORTABLE CHEST 1 VIEW

[AP]
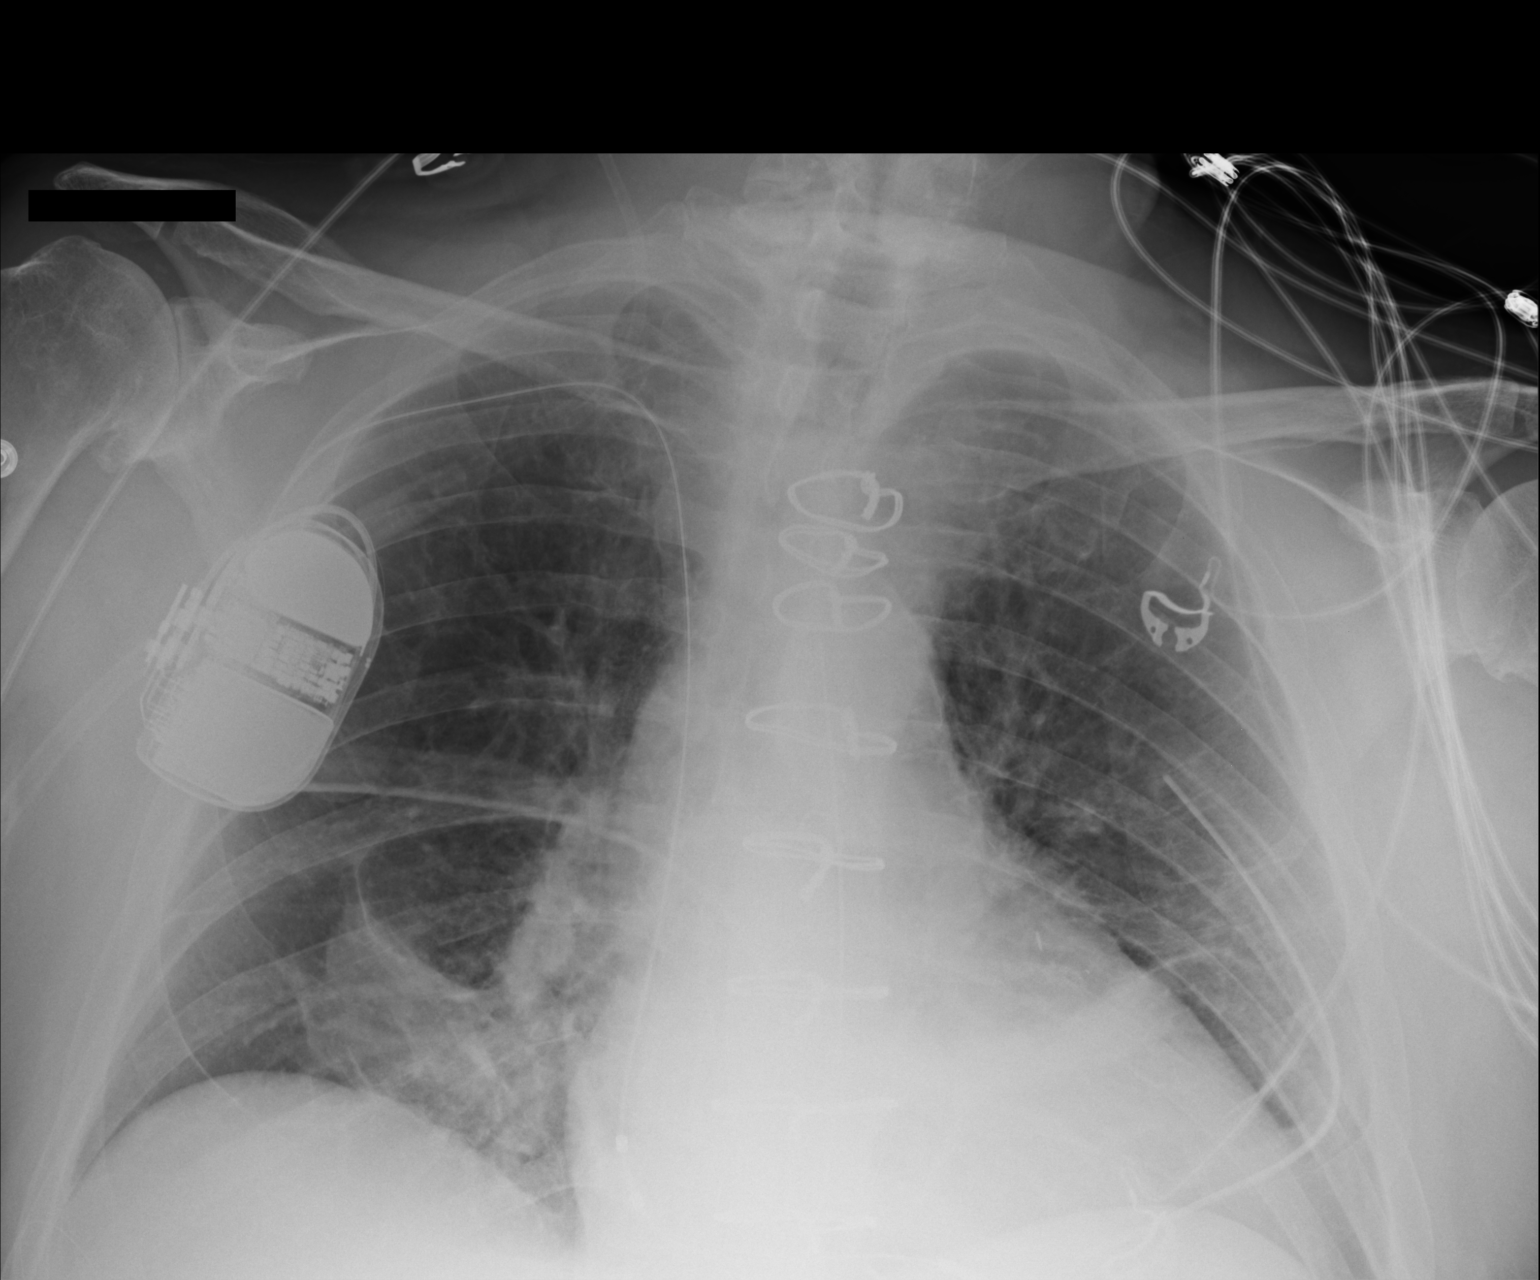

[1 of 1 positions shown; findings below may reference images not displayed]

FINDINGS: Bilateral chest tubes in place unchanged.  No pneumothorax

Bibasilar atelectasis unchanged. No significant pleural effusion or
edema.
IMPRESSION: Negative for pneumothorax.

Bibasilar atelectasis unchanged.

## 2016-05-06 IMAGING — DX DG CHEST 2V
2 series · 2 of 2 positions shown · non-contrast
Comparison: Portable chest x-ray September 05, 2015

CLINICAL DATA: Status post CABG on September 02, 2015

EXAM:
CHEST  2 VIEW

[chest pa]
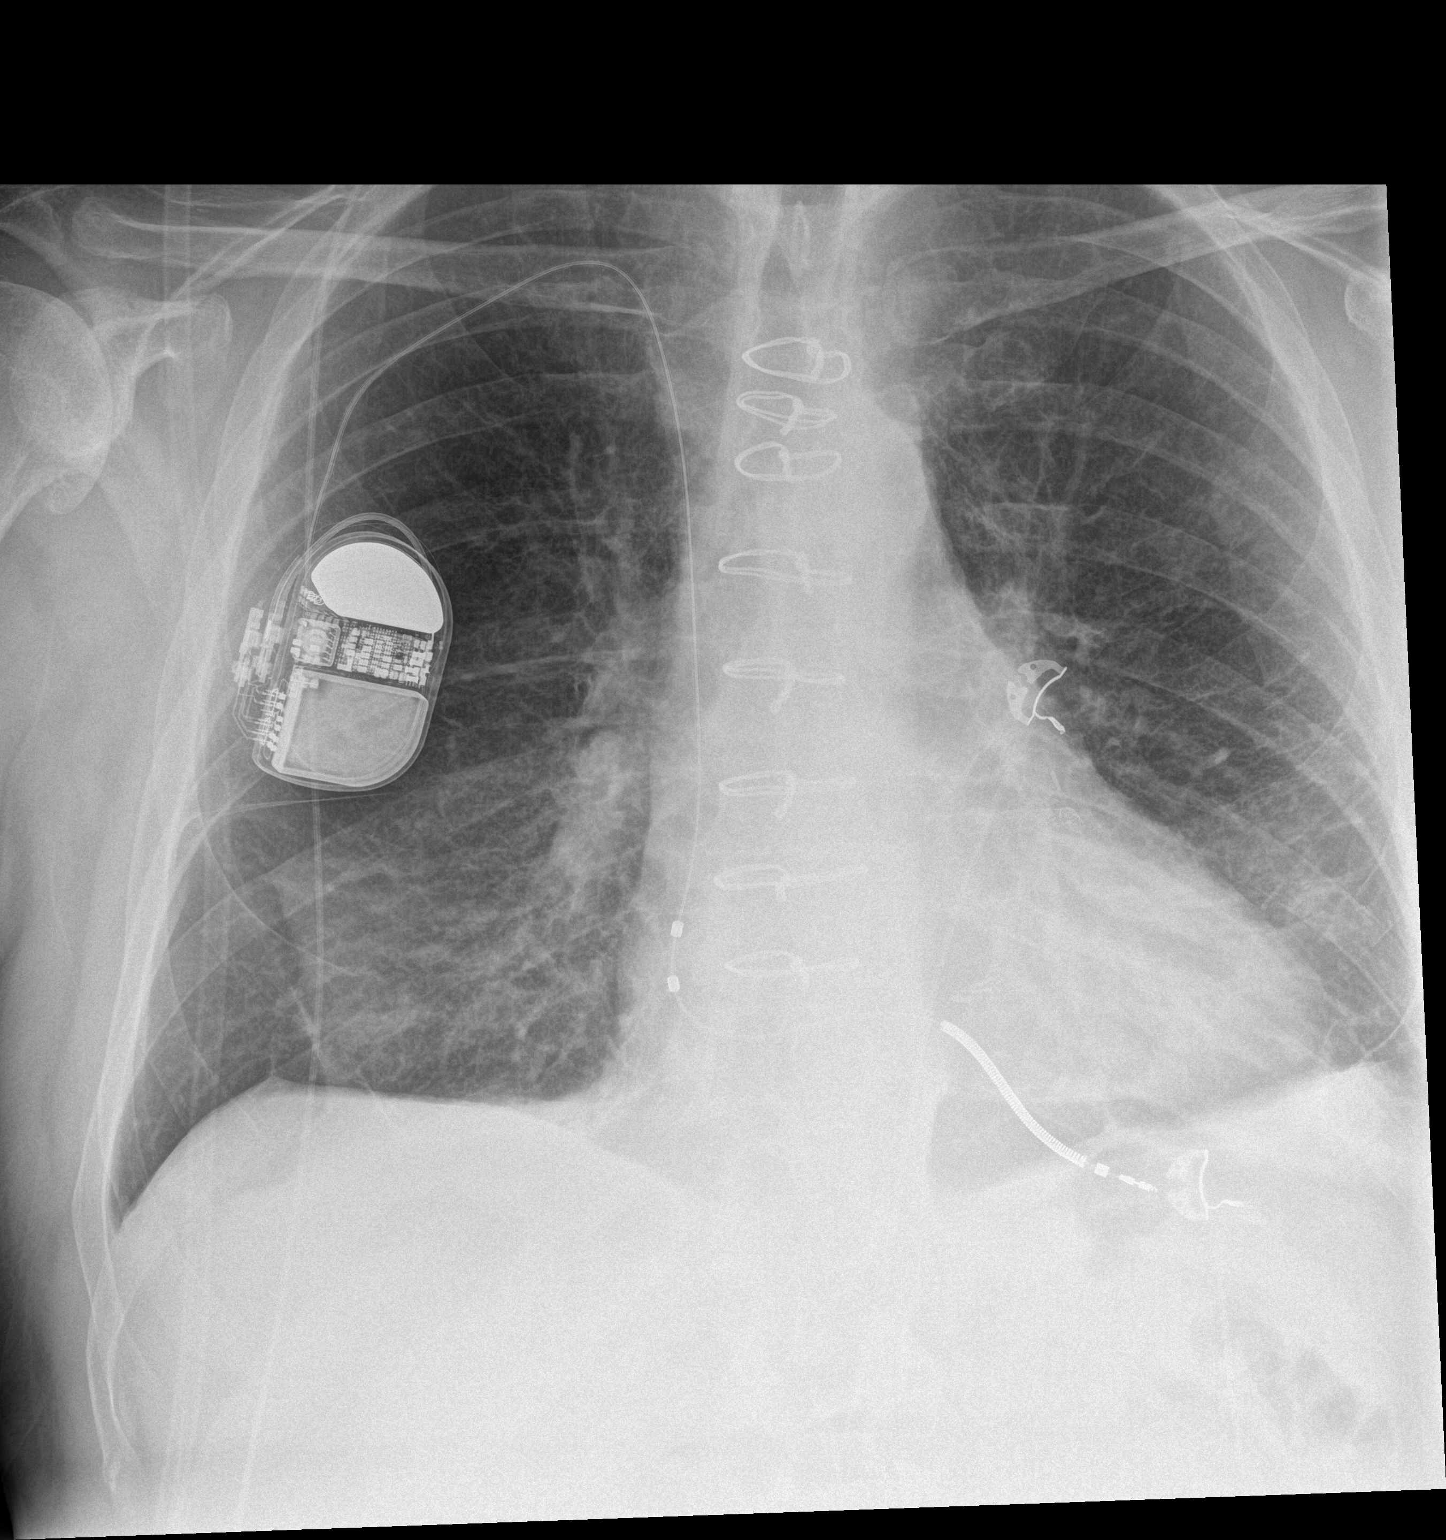

[chest lat]
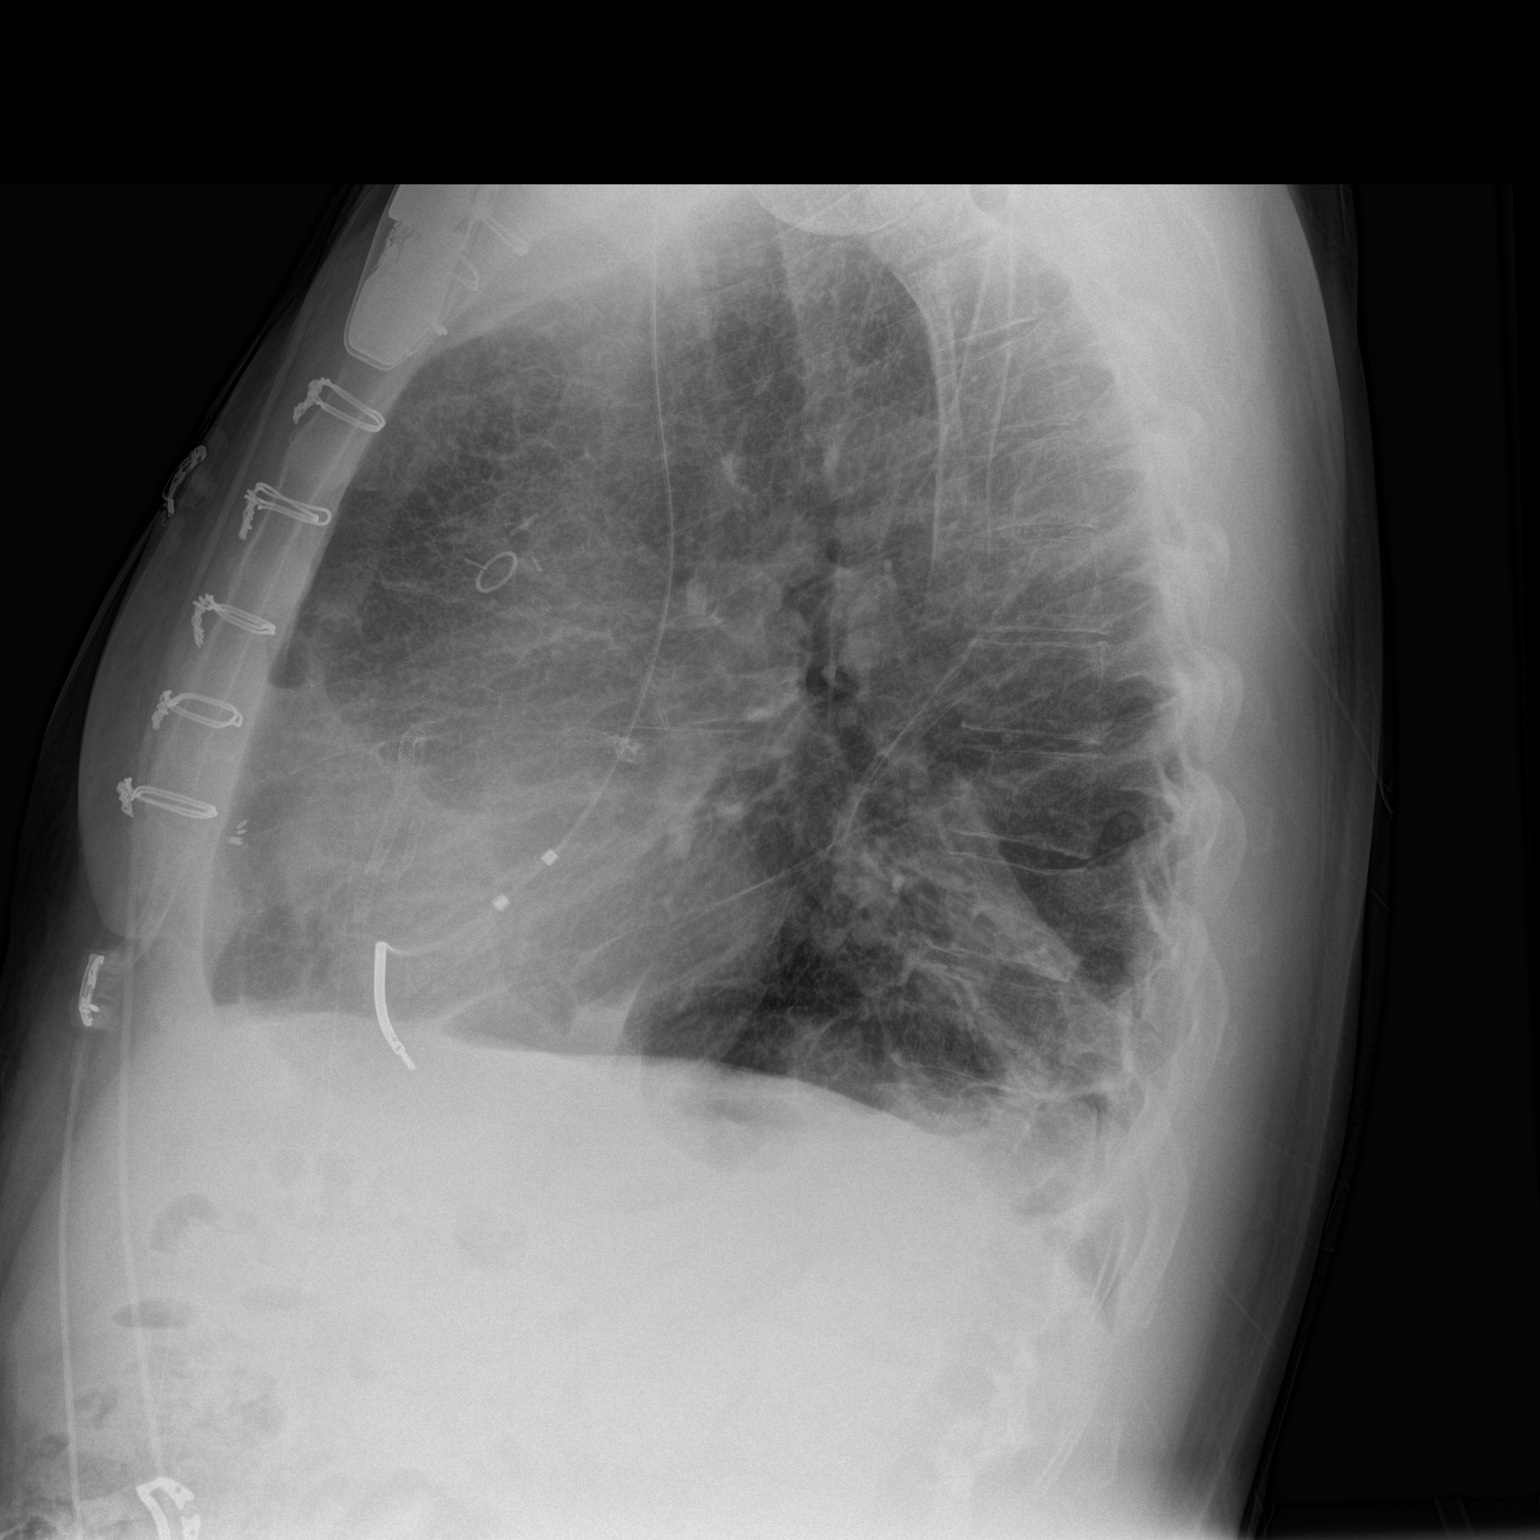

[2 of 2 positions shown; findings below may reference images not displayed]

FINDINGS: The lungs are well-expanded. There is persistent left lower lobe
atelectasis with trace left pleural effusion. The heart is
top-normal in size. The pulmonary vascularity is normal. The
permanent pacemaker defibrillator is in reasonable position. There
are 8 intact sternal wires. The mediastinum is normal in width. The
retrosternal soft tissues reveal small amounts of loculated air with
some pleural thickening.
IMPRESSION: Persistent left lower lobe atelectasis and trace pleural effusion.
Postsurgical changes in the retrosternal region. No CHF nor other
acute abnormality.

## 2016-05-30 ENCOUNTER — Other Ambulatory Visit: Payer: Self-pay | Admitting: Cardiology

## 2016-07-05 ENCOUNTER — Other Ambulatory Visit: Payer: Self-pay | Admitting: Cardiology

## 2016-07-05 NOTE — Telephone Encounter (Signed)
Rx request sent to pharmacy.  

## 2016-07-29 ENCOUNTER — Other Ambulatory Visit: Payer: Self-pay | Admitting: Cardiology

## 2016-07-31 NOTE — Telephone Encounter (Signed)
Rx(s) sent to pharmacy electronically.  

## 2016-08-28 ENCOUNTER — Ambulatory Visit (INDEPENDENT_AMBULATORY_CARE_PROVIDER_SITE_OTHER): Payer: Medicaid Other | Admitting: Thoracic Surgery (Cardiothoracic Vascular Surgery)

## 2016-08-28 ENCOUNTER — Encounter: Payer: Self-pay | Admitting: Thoracic Surgery (Cardiothoracic Vascular Surgery)

## 2016-08-28 VITALS — BP 118/80 | HR 68 | Resp 18 | Ht 73.0 in | Wt 204.0 lb

## 2016-08-28 DIAGNOSIS — Z951 Presence of aortocoronary bypass graft: Secondary | ICD-10-CM

## 2016-08-28 DIAGNOSIS — I5022 Chronic systolic (congestive) heart failure: Secondary | ICD-10-CM

## 2016-08-28 DIAGNOSIS — Z9861 Coronary angioplasty status: Secondary | ICD-10-CM

## 2016-08-28 DIAGNOSIS — I2510 Atherosclerotic heart disease of native coronary artery without angina pectoris: Secondary | ICD-10-CM

## 2016-08-28 NOTE — Patient Instructions (Signed)
Continue all previous medications without any changes at this time  Make every effort to maintain a "heart-healthy" lifestyle with regular physical exercise and adherence to a low-fat, low-carbohydrate diet.  Continue to seek regular follow-up appointments with your primary care physician and/or cardiologist. 

## 2016-08-28 NOTE — Progress Notes (Signed)
[address]       [place] [address]             [phone]     CARDIOTHORACIC SURGERY OFFICE NOTE  Referring Provider is Martinique, Peter M, MD PCP is Velna Hatchet, MD   HPI:  Patient is a 60 year old male who returns to the office for follow-up today approximately one year status post coronary artery bypass grafting 2 on 09/02/2015 for left main disease with three-vessel coronary artery disease, ischemic cardiomyopathy, and chronic combined systolic and diastolic congestive heart failure. His postoperative recovery was uneventful and he was last seen here in our office on 10/04/2015. Since then he has done very well. He completed the outpatient cardiac rehabilitation program. He has been followed intermittently by Dr. Martinique and by Dr. [name] in the EP clinic. The patient returns to our office today and states that he has done exceptionally well. He reports no physical limitations whatsoever. He gets short of breath only with very strenuous physical activity. He has no shortness of breath or chest discomfort with ordinary activities and he remains quite active. He states that he feels much better than he had for quite a long time prior to his surgery. Overall he has no complaints. The remainder of his review of systems is noncontributory.   Current Outpatient Prescriptions  Medication Sig Dispense Refill  . acetaminophen (TYLENOL) 325 MG tablet Take 2 tablets (650 mg total) by mouth every 4 (four) hours as needed for headache or mild pain.    Marland Kitchen allopurinol (ZYLOPRIM) 300 MG tablet TAKE ONE TABLET BY MOUTH ONCE DAILY 30 tablet 3  . aspirin EC 81 MG tablet Take 81 mg by mouth daily.    Marland Kitchen atorvastatin (LIPITOR) 40 MG tablet TAKE ONE TABLET BY MOUTH ONCE DAILY AT 6PM 30 tablet 10  . carvedilol (COREG) 6.25 MG tablet Take 1 tablet (6.25 mg total) by mouth 2 (two) times daily with a meal. 180 tablet 3  . furosemide (LASIX) 40 MG tablet TAKE ONE TABLET BY MOUTH ONCE  DAILY 30 tablet 6  . lisinopril (PRINIVIL,ZESTRIL) 5 MG tablet TAKE ONE TABLET BY MOUTH ONCE DAILY 30 tablet 5  . Magnesium 400 MG TABS Take 400 mg by mouth daily.    . Multiple Vitamin (MULTIVITAMIN WITH MINERALS) TABS tablet Take 1 tablet by mouth daily.    Marland Kitchen NITROSTAT 0.4 MG SL tablet DISSOLVE ONE TABLET UNDER THE TONGUE EVERY 5 MINUTES AS NEEDED FOR CHEST PAIN. 25 tablet 4  . Omega-3 Fatty Acids (FISH OIL PO) Take 1 capsule by mouth daily.    Marland Kitchen spironolactone (ALDACTONE) 25 MG tablet TAKE ONE TABLET BY MOUTH AT BEDTIME 90 tablet 2   No current facility-administered medications for this visit.       Physical Exam:   BP 118/80   Pulse 68   Resp 18   Ht 6\' 1"  (1.854 m)   Wt 204 lb (92.5 kg)   SpO2 97% Comment: RA  BMI 26.91 kg/m   General:  Well appearing  Chest:   Clear to auscultation  CV:   Regular rate and rhythm without murmur  Incisions:  Completely healed, sternum is stable  Abdomen:  Soft and nontender  Extremities:  Warm and well-perfused  Diagnostic Tests:  n/a   Impression:  Patient is doing very well approximately one year status post coronary artery bypass grafting.  Plan:  In the future the patient will call and return to see [place] as  needed. We have not recommended any changes to his current medications. He will continue to follow-up with Dr. Martinique as previously planned. He has been reminded regarding the many benefits of a heart healthy lifestyle including regular exercise and a heart healthy diet. All of his questions have been addressed.  I spent in excess of 15 minutes during the conduct of this office consultation and >50% of this time involved direct face-to-face encounter with the patient for counseling and/or coordination of their care.    Valentina Gu. Roxy Manns, MD 08/28/2016 10:12 AM

## 2016-09-09 ENCOUNTER — Other Ambulatory Visit: Payer: Self-pay | Admitting: Nurse Practitioner

## 2016-10-09 ENCOUNTER — Telehealth: Payer: Self-pay | Admitting: Cardiology

## 2016-10-09 NOTE — Telephone Encounter (Signed)
NEw Message  Pt call requesting to speak with RN. Pt states he only wants to discuss with RN. Please call back to discuss

## 2016-10-09 NOTE — Telephone Encounter (Signed)
Returned call to patient.He stated he had episode of chest pain and afib after 2:00 pm today.He took NTG x 1 with no relief, gave him a headache.Stated he is not having chest pain or afib at present.Appointment scheduled with Rosaria Ferries PA 10/10/16 at 2:00 pm.Advised if he starts having chest pain or afib again he needs to go to ER.

## 2016-10-10 ENCOUNTER — Encounter: Payer: Self-pay | Admitting: Physician Assistant

## 2016-10-10 ENCOUNTER — Ambulatory Visit (INDEPENDENT_AMBULATORY_CARE_PROVIDER_SITE_OTHER): Payer: Medicaid Other | Admitting: Physician Assistant

## 2016-10-10 ENCOUNTER — Other Ambulatory Visit: Payer: Self-pay | Admitting: Physician Assistant

## 2016-10-10 VITALS — BP 115/78 | HR 69 | Ht 73.0 in | Wt 205.4 lb

## 2016-10-10 DIAGNOSIS — I255 Ischemic cardiomyopathy: Secondary | ICD-10-CM

## 2016-10-10 DIAGNOSIS — I480 Paroxysmal atrial fibrillation: Secondary | ICD-10-CM | POA: Diagnosis not present

## 2016-10-10 DIAGNOSIS — R079 Chest pain, unspecified: Secondary | ICD-10-CM | POA: Diagnosis not present

## 2016-10-10 MED ORDER — ISOSORBIDE MONONITRATE ER 30 MG PO TB24: 30.0000 mg | ORAL_TABLET | Freq: Every day | ORAL | 6 refills | Status: DC

## 2016-10-10 NOTE — Patient Instructions (Addendum)
Medication Instructions:    START TAKING IMDUR 30 MG ONCE A DAY   1. FOR FIRST THREE DAYS TAKE 15 MG  (1/2 TABLET OF 30 MG ) ONCE A DAY   2. THEN START TAKING A WHOLE TABLET 30 MG ONCE A DAY   If you need a refill on your cardiac medications before your next appointment, please call your pharmacy.  Labwork: TROPONIN    Testing/Procedures: Your physician has requested that you have an echocardiogram. Echocardiography is a painless test that uses sound waves to create images of your heart. It provides your doctor with information about the size and shape of your heart and how well your heart's chambers and valves are working. This procedure takes approximately one hour. There are no restrictions for this procedure.   Follow-Up:  WITH DR Martinique NEXT AVAILABLE    Any Other Special Instructions Will Be Listed Below (If Applicable).

## 2016-10-10 NOTE — Progress Notes (Signed)
Cardiology Office Note   Date:  10/10/2016   ID:  [name], DOB [birth year], MRN [id number]  PCP:  Velna Hatchet, MD  Cardiologist:  Dr Martinique 12/2015, Dr Brayton Mars, PA-C   Chief Complaint  Patient presents with  . Chest Pain  . Atrial Fibrillation    History of Present Illness: Kerry Mason is a 60 y.o. male with a history of stent RCA 2000;   STEMI 12/2013>DES x 2 LAD w/ RCA 100%;    abnl MV 11/2014>cath CTO RCA>CTO PCI 02/2015 w/ perf>tamponade & stenting to cover>pericarditis and had PAF;   NSTEMI 06/2015 RCA 100%>med rx but w/ sx so recath> L main >50%>CABG 08/2015 w/ LIMA-LAD, SVG-RI, RCA 100% but treated medically ICM w/ EF 30-35% 09/02/2015 TEE at surgery, S-D-CHF, [employer] ICD 2015, HTN, HLD, tob use, gout, COPD  Pt called 11/06 c/o CP and palps, appt made  [name] presents for evaluation of chest pain and palpitations.  Yesterday afternoon, pt was feeling well, then started feeling bad. Energy level decreased, felt weak. Got chest pain, heaviness and a slight pinching discomfort mid-R sternum. 6/10 at its worst. Not like previous angina which was back pain and cold sweats. Felt a little SOB, slightly nauseated at times, it would come in waves, no vomiting, no diaphoresis. Never had this before. Felt his heart was beating faster than usual and harder than usual. The entire episode lasted several hours. He took 1 SL NTG, feels it helped his sx. Also took 2 ASA. Sx finally resolved and then he was tired and a little stressed, but otherwise back to normal.   Sx began while walking, had been walking about 100 yards. He normally does a 6 mile walk several times a week, last time was 11/04. No sx then. No LE edema. No orthopnea or PND. Has generally lost weight, but it is up a little today.   This am, felt tired but has had no recurrence of sx. he is concerned about his symptoms, because he has had so much go on before. His volume status has been  good. He has not been experiencing lower extremity edema. He has been compliant with medications and following his weights. He watches the sodium and what he eats. He has not been experiencing orthopnea or PND. He feels his dyspnea on exertion is at baseline and he was doing very well until this incident.   Past Medical History:  Diagnosis Date  . Acute systolic CHF (congestive heart failure) ([place])   . AICD (automatic cardioverter/defibrillator) present   . Anxiety   . Basal cell carcinoma    left arm  . CAD (coronary artery disease)    a. 2000: s/p stent of RCA 2000 with BMS  b. 2015: STEMI s/p LHC with old occlusion of RCA and DESx2 to LAD  c. 123456: complicated PCI on 3/2 for CTO of mid RCA with coronary perforation and cardiac tamponade requiring emergent pericardiocentesis     . Cardiac tamponade    a. 02/03/15 2/2 coronary perforation during CTO procedure. Sealed with graftmaster coated stent.   . Gout   . History of pneumonia   . HTN (hypertension)   . Hyperlipidemia   . Ischemic cardiomyopathy    a. 2D ECHO: EF 25-30%. Akinesis of the anteroseptal and  . Left main coronary artery disease   . Myocardial infarction   . Obesity   . 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 Shortness of breath dyspnea   . Tobacco abuse   . Urinary frequency     Past Surgical History:  Procedure Laterality Date  . CARDIAC CATHETERIZATION    . CARDIAC CATHETERIZATION N/A 06/14/2015   Procedure: Left Heart Cath and Coronary Angiography;  Surgeon: Lorretta Harp, MD;  Location: [place] CV LAB;  Service: Cardiovascular;  Laterality: N/A;  . CARDIAC CATHETERIZATION N/A 08/18/2015   Procedure: Intravascular Pressure Wire/FFR Study;  Surgeon: Peter M Martinique, MD;  Location: [place] [facility];  Service: Cardiovascular;  Laterality: N/A;  . COLONOSCOPY    . CORONARY ANGIOPLASTY  2000  . CORONARY ANGIOPLASTY WITH STENT PLACEMENT  2015  .  CORONARY ARTERY BYPASS GRAFT N/A 09/02/2015   Procedure: CORONARY ARTERY BYPASS GRAFTING (CABG) x 2 (LIMA-LAD, SVG-Intermediate) ENDOSCOPIC GREATER SAPHENOUS VEIN HARVEST RIGHT THIGH;  Surgeon: Rexene Alberts, MD;  Location: [place];  Service: Open Heart Surgery;  Laterality: N/A;  . FOOT SURGERY    . IMPLANTABLE CARDIOVERTER DEFIBRILLATOR IMPLANT N/A 04/17/2014   Procedure: IMPLANTABLE CARDIOVERTER DEFIBRILLATOR IMPLANT;  Surgeon: Evans Lance, MD;  Location[facility] CATH LAB;  Service: Cardiovascular;  Laterality: N/A;  . LEFT HEART CATHETERIZATION WITH CORONARY ANGIOGRAM N/A 12/11/2013   Procedure: LEFT HEART CATHETERIZATION WITH CORONARY ANGIOGRAM;  Surgeon: Peter M Martinique, MD;  Location: [facility] CATH LAB;  Service: Cardiovascular;  Laterality: N/A;  . LEFT HEART CATHETERIZATION WITH CORONARY ANGIOGRAM N/A 01/05/2015   Procedure: LEFT HEART CATHETERIZATION WITH CORONARY ANGIOGRAM;  Surgeon: Peter M Martinique, MD;  Location: [facility] CATH LAB;  Service: Cardiovascular;  Laterality: N/A;  . PERCUTANEOUS CORONARY STENT INTERVENTION (PCI-S)  12/11/2013   Procedure: PERCUTANEOUS CORONARY STENT INTERVENTION (PCI-S);  Surgeon: Peter M Martinique, MD;  Location: [facility] CATH LAB;  Service: Cardiovascular;;  prov LAD and mid LAD  . PERCUTANEOUS CORONARY STENT INTERVENTION (PCI-S) N/A 02/03/2015   Procedure: PERCUTANEOUS CORONARY STENT INTERVENTION (PCI-S);  Surgeon: Jettie Booze, MD;  Location: [facility] CATH LAB;  Service: Cardiovascular;  Laterality: N/A;  . TEE WITHOUT CARDIOVERSION N/A 09/02/2015   Procedure: TRANSESOPHAGEAL ECHOCARDIOGRAM (TEE);  Surgeon: Rexene Alberts, MD;  Location: [place];  Service: Open Heart Surgery;  Laterality: N/A;    Current Outpatient Prescriptions  Medication Sig Dispense Refill  . acetaminophen (TYLENOL) 325 MG tablet Take 2 tablets (650 mg total) by mouth every 4 (four) hours as needed for headache or mild pain.    Marland Kitchen allopurinol (ZYLOPRIM) 300 MG tablet TAKE ONE TABLET BY MOUTH ONCE DAILY 30 tablet 3    . aspirin EC 81 MG tablet Take 81 mg by mouth daily.    Marland Kitchen atorvastatin (LIPITOR) 40 MG tablet TAKE ONE TABLET BY MOUTH ONCE DAILY AT 6PM 30 tablet 10  . carvedilol (COREG) 6.25 MG tablet Take 1 tablet (6.25 mg total) by mouth 2 (two) times daily with a meal. 180 tablet 3  . furosemide (LASIX) 40 MG tablet Take 40 mg by mouth daily as needed for edema.    Marland Kitchen lisinopril (PRINIVIL,ZESTRIL) 5 MG tablet TAKE ONE TABLET BY MOUTH ONCE DAILY 30 tablet 5  . NITROSTAT 0.4 MG SL tablet DISSOLVE ONE TABLET UNDER THE TONGUE EVERY 5 MINUTES AS NEEDED FOR CHEST PAIN. 25 tablet 4  . Omega-3 Fatty Acids (FISH OIL PO) Take 1 capsule by mouth daily.    Marland Kitchen spironolactone (ALDACTONE) 25 MG tablet TAKE ONE TABLET BY MOUTH AT BEDTIME 30 tablet 8  . isosorbide mononitrate (IMDUR) 30 MG 24 hr  tablet Take 1 tablet (30 mg total) by mouth daily. 30 tablet 6   No current facility-administered medications for this visit.     Allergies:   Patient has no known allergies.    Social History:  The patient  reports that he quit smoking about 2 years ago. His smoking use included Cigarettes. He has a 35.00 pack-year smoking history. He has never used smokeless tobacco. He reports that he does not drink alcohol or use drugs.   Family History:  The patient's family history includes CAD in his father; Cancer in his mother.    ROS:  Please see the history of present illness. All other systems are reviewed and negative.    PHYSICAL EXAM: VS:  BP 115/78   Pulse 69   Ht 6\' 1"  (1.854 m)   Wt 205 lb 6.4 oz (93.2 kg)   BMI 27.10 kg/m  , BMI Body mass index is 27.1 kg/m. GEN: Well nourished, well developed, male in no acute distress  HEENT: normal for age  Neck: no JVD, no carotid bruit, no masses Cardiac: RRR; no murmur, no rubs, or gallops Respiratory: few scattered rales bilaterally, normal work of breathing GI: soft, nontender, nondistended, + BS MS: no deformity or atrophy; no edema; distal pulses are 2+ in all 4  extremities   Skin: warm and dry, no rash Neuro:  Strength and sensation are intact Psych: euthymic mood, full affect   EKG:  EKG is ordered today. The ekg ordered today demonstrates SR, T wave inversions in inferior and lateral leads are old  ECHO: 08/25/2015 - Left ventricle: The cavity size was normal. Wall thickness was   increased in a pattern of mild LVH. Systolic function was   moderately to severely reduced. The estimated ejection fraction   was in the range of 30% to 35%. Abnormal GLPSS at -15%, inferior   predominence. Inferior, inferoapical, apical and distal septal   akinesis. Doppler parameters are consistent with abnormal left   ventricular relaxation (grade 1 diastolic dysfunction). The E/e&'   ratio is <8, suggesting normal LV filling pressure. - Mitral valve: Calcified annulus. There was trivial regurgitation. - Left atrium: The atrium was at the upper limits of normal in size. - Right ventricle: The cavity size was mildly dilated. Pacer wire   or catheter noted in right ventricle. Systolic function was normal. - Right atrium: The atrium was mildly dilated. Pacer wire or   catheter noted in right atrium. - Inferior vena cava: The vessel was normal in size. The   respirophasic diameter changes were in the normal range (>= 50%),   consistent with normal central venous pressure. Impressions: - Compared to a prior echo in 02/2015, the EF has improved to   30-35%. There is akinesis of the apex, distal septum and   inferoapical walls. AICD leads are in place. There is no   pericardial effusion.  Recent Labs: 12/24/2015: ALT 11; BUN 14; Creat 0.93; Hemoglobin 16.0; Platelets 241; Potassium 4.8; Sodium 137; TSH 1.254    Lipid Panel    Component Value Date/Time   CHOL 112 (L) 12/24/2015 0909   TRIG 79 12/24/2015 0909   HDL 33 (L) 12/24/2015 0909   CHOLHDL 3.4 12/24/2015 0909   VLDL 16 12/24/2015 0909   LDLCALC 63 12/24/2015 0909     Wt Readings from Last 3  Encounters:  10/10/16 205 lb 6.4 oz (93.2 kg)  08/28/16 204 lb (92.5 kg)  01/07/16 208 lb 12.8 oz (94.7 kg)  Other studies Reviewed: Additional studies/ records that were reviewed today include: office notes, hospital records and testing.  ASSESSMENT AND PLAN:  1.  Chest pain, high risk of cardiac etiology: I showed him his cath diagram and explained where the bypass grafts were. I explained that his RCA was not bypassed and it it is possible that he had angina coming from that vessel. I also explained that if he was having a problem with one of his bypass grafts, the symptoms should not be with minor exertion like that, but should calm when he is exerting himself completely and should be more consistent. The chest discomfort he had is nothing like his previous anginal symptoms. However, it concerned him greatly and he has known significant coronary artery disease.  The symptoms lasted 3 hours. Therefore, we will check a troponin. This is elevated, cardiac catheterization is indicated. We will also check an echocardiogram. He is to add Imdur to his medication regimen, starting with a half tablet daily for 3 days and going to a whole tablet daily. Hopefully, his blood pressure will tolerate it.  If the echocardiogram is improved or unchanged, and he has no more symptoms, continue medical therapy for now. However, he has had multiple interventions and his bypass surgery was over a year ago.  **Therefore, will discuss repeat catheterization with Dr. Martinique  2. Ischemic cardiomyopathy: His volume status has been good on his home scales. His lifestyle is greatly improved. Recheck in echocardiogram. If his EF is still significantly decreased, he may qualify for an ICD.  **He is on a low dose of an Ace. If his EF is still decreased and we desire to switch him to [facility], we'll need to try to change the lisinopril to a low dose of losartan.  3. PAF: He has had some palpitations, frequently when he  lies down at night, he will feel his heart beating harder and faster than he feels is appropriate. He has not had episodes of an irregular heart rate. The rep was contacted. His device has an atrial sensor. He had an episode of atrial tachycardia several months ago, but during the chest pain episode yesterday and in the recent past, has had no significant arrhythmia.   Current medicines are reviewed at length with the patient today.  The patient does not have concerns regarding medicines.  The following changes have been made:  Add Imdur   Labs/ tests ordered today include:   Orders Placed This Encounter  Procedures  . Troponin I  . EKG 12-Lead  . ECHOCARDIOGRAM COMPLETE     Disposition:   FU with Dr. Martinique  Signed, Rosaria Ferries, PA-C  10/10/2016 4:16 PM    [place] Phone: [phone]; Fax: [phone]  This note was written with the assistance of speech recognition software. Please excuse any transcriptional errors.

## 2016-10-11 LAB — TROPONIN I: Troponin I: <0.01 ng/mL (ref <=0.05)

## 2016-10-31 ENCOUNTER — Other Ambulatory Visit (HOSPITAL_COMMUNITY): Payer: Medicaid Other

## 2016-11-07 ENCOUNTER — Other Ambulatory Visit: Payer: Self-pay | Admitting: Cardiology

## 2016-11-14 ENCOUNTER — Ambulatory Visit (INDEPENDENT_AMBULATORY_CARE_PROVIDER_SITE_OTHER): Payer: Medicaid Other | Admitting: Cardiology

## 2016-11-14 ENCOUNTER — Encounter: Payer: Self-pay | Admitting: Cardiology

## 2016-11-14 VITALS — BP 117/68 | HR 73 | Ht 73.5 in | Wt 204.2 lb

## 2016-11-14 DIAGNOSIS — Z9861 Coronary angioplasty status: Secondary | ICD-10-CM

## 2016-11-14 DIAGNOSIS — I2510 Atherosclerotic heart disease of native coronary artery without angina pectoris: Secondary | ICD-10-CM

## 2016-11-14 DIAGNOSIS — I5022 Chronic systolic (congestive) heart failure: Secondary | ICD-10-CM | POA: Diagnosis not present

## 2016-11-14 DIAGNOSIS — I255 Ischemic cardiomyopathy: Secondary | ICD-10-CM

## 2016-11-14 DIAGNOSIS — I480 Paroxysmal atrial fibrillation: Secondary | ICD-10-CM

## 2016-11-14 DIAGNOSIS — Z951 Presence of aortocoronary bypass graft: Secondary | ICD-10-CM

## 2016-11-14 NOTE — Progress Notes (Signed)
[name] Date of Birth: 1955/12/22 Medical Record [id number]  History of Present Illness: Mr. Palmatier is seen for follow up CAD.  He has known CAD with remote stenting of the RCA in 2000 and then lost to follow up. Had anterior STEMI in January of 2015 - with cath showing occlusion of the proximal RCA with collaterals and occlusion of the proximal LAD. RCA occlusion felt to be chronic. EF 35% by cath and 30% by echo. Proximal and mid to distal LAD were stented with DES.  He  had a subsequent ICD implanted. Other issues include tobacco abuse gout, HLD, and HTN. In December 2105 he reported  increased SOB and chest pain. Myoview study was high risk with large area of anterior, septal and apical scar with inferoapical ischemia. EF 29%. He underwent repeat cardiac cath that showed the stents in the LAD were widely patent. The first diagonal had an 80% stenosis. The RCA was occluded proximally with left to right collaterals. Due to his refractory anginal symptoms he underwent CTO PCI on 02/03/15. His procedure was successful with excellent angiographic result but he did have a complication of perforation of the mid RCA. This resulted in cardiac tamponade and required emergent pericardiocentesis with removal of about a liter of blood. The perforation was sealed with a Graftmaster covered stent in the mid RCA. The remainder of the RCA was stented with 4 additional DES stents.  He did have resultant pericarditis. He was admitted in July  with a NSTEMI.  Ecg showed no acute change with old anterior infarct. Troponin peaked at 2.82. He underwent cardiac cath that showed occlusion of the mid RCA with left to right collaterals. He was treated medically with the addition of Imdur.  On medical therapy he continued to be symptomatic. He underwent repeat cath evaluation with FFR of the left main 50% stenosis which was abnormal.  He was referred for CABG which was done by Dr. Roxy Manns on 09/02/15. This included a LIMA to  the LAD and SVG to the ramus intermediate. The RCA was not bypassed due to small target and there was also significant scarring from prior pericarditis. His post op course was uncomplicated. He was noted to have COPD.  He was seen last month by Rosaria Ferries [facility] with symptoms of palpitations, chest discomfort and dizziness. He was started on Imdur and symptoms resolved.  He is still exercising .No chest pain or SOB.  Denies tobacco use.  Notes occasional skip in heart beat at night.  Overall he feels well.    Current Outpatient Prescriptions  Medication Sig Dispense Refill  . acetaminophen (TYLENOL) 325 MG tablet Take 2 tablets (650 mg total) by mouth every 4 (four) hours as needed for headache or mild pain.    Marland Kitchen allopurinol (ZYLOPRIM) 300 MG tablet TAKE ONE TABLET BY MOUTH ONCE DAILY 30 tablet 3  . aspirin EC 81 MG tablet Take 81 mg by mouth daily.    Marland Kitchen atorvastatin (LIPITOR) 40 MG tablet TAKE ONE TABLET BY MOUTH ONCE DAILY AT 6PM 30 tablet 10  . carvedilol (COREG) 6.25 MG tablet Take 1 tablet (6.25 mg total) by mouth 2 (two) times daily with a meal. 180 tablet 3  . furosemide (LASIX) 40 MG tablet Take 40 mg by mouth daily as needed for edema.    . isosorbide mononitrate (IMDUR) 30 MG 24 hr tablet Take 1 tablet (30 mg total) by mouth daily. 30 tablet 6  . lisinopril (PRINIVIL,ZESTRIL) 5 MG tablet TAKE ONE TABLET  BY MOUTH ONCE DAILY 30 tablet 11  . NITROSTAT 0.4 MG SL tablet DISSOLVE ONE TABLET UNDER THE TONGUE EVERY 5 MINUTES AS NEEDED FOR CHEST PAIN. 25 tablet 4  . Omega-3 Fatty Acids (FISH OIL PO) Take 1 capsule by mouth daily.    Marland Kitchen spironolactone (ALDACTONE) 25 MG tablet TAKE ONE TABLET BY MOUTH AT BEDTIME 30 tablet 8   No current facility-administered medications for this visit.     No Known Allergies  Past Medical History:  Diagnosis Date  . Acute systolic CHF (congestive heart failure) ([place])   . AICD (automatic cardioverter/defibrillator) present   . Anxiety   . Basal cell  carcinoma    left arm  . CAD (coronary artery disease)    a. 2000: s/p stent of RCA 2000 with BMS  b. 2015: STEMI s/p LHC with old occlusion of RCA and DESx2 to LAD  c. 123456: complicated PCI on 3/2 for CTO of mid RCA with coronary perforation and cardiac tamponade requiring emergent pericardiocentesis     . Cardiac tamponade    a. 02/03/15 2/2 coronary perforation during CTO procedure. Sealed with graftmaster coated stent.   . Gout   . History of pneumonia   . HTN (hypertension)   . Hyperlipidemia   . Ischemic cardiomyopathy    a. 2D ECHO: EF 25-30%. Akinesis of the anteroseptal and  . Left main coronary artery disease   . Myocardial infarction   . Obesity   . 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 Shortness of breath dyspnea   . Tobacco abuse   . Urinary frequency     Past Surgical History:  Procedure Laterality Date  . CARDIAC CATHETERIZATION    . CARDIAC CATHETERIZATION N/A 06/14/2015   Procedure: Left Heart Cath and Coronary Angiography;  Surgeon: Lorretta Harp, MD;  Location: [place] CV LAB;  Service: Cardiovascular;  Laterality: N/A;  . CARDIAC CATHETERIZATION N/A 08/18/2015   Procedure: Intravascular Pressure Wire/FFR Study;  Surgeon: Peter M Martinique, MD;  Location: [place] CV LAB;  Service: Cardiovascular;  Laterality: N/A;  . COLONOSCOPY    . CORONARY ANGIOPLASTY  2000  . CORONARY ANGIOPLASTY WITH STENT PLACEMENT  2015  . CORONARY ARTERY BYPASS GRAFT N/A 09/02/2015   Procedure: CORONARY ARTERY BYPASS GRAFTING (CABG) x 2 (LIMA-LAD, SVG-Intermediate) ENDOSCOPIC GREATER SAPHENOUS VEIN HARVEST RIGHT THIGH;  Surgeon: Rexene Alberts, MD;  Location: [place];  Service: Open Heart Surgery;  Laterality: N/A;  . FOOT SURGERY    . IMPLANTABLE CARDIOVERTER DEFIBRILLATOR IMPLANT N/A 04/17/2014   Procedure: IMPLANTABLE CARDIOVERTER DEFIBRILLATOR IMPLANT;  Surgeon: Evans Lance, MD;  Location: [facility] CATH LAB;  Service:  Cardiovascular;  Laterality: N/A;  . LEFT HEART CATHETERIZATION WITH CORONARY ANGIOGRAM N/A 12/11/2013   Procedure: LEFT HEART CATHETERIZATION WITH CORONARY ANGIOGRAM;  Surgeon: Peter M Martinique, MD;  Location: [facility] CATH LAB;  Service: Cardiovascular;  Laterality: N/A;  . LEFT HEART CATHETERIZATION WITH CORONARY ANGIOGRAM N/A 01/05/2015   Procedure: LEFT HEART CATHETERIZATION WITH CORONARY ANGIOGRAM;  Surgeon: Peter M Martinique, MD;  Location: [facility] CATH LAB;  Service: Cardiovascular;  Laterality: N/A;  . PERCUTANEOUS CORONARY STENT INTERVENTION (PCI-S)  12/11/2013   Procedure: PERCUTANEOUS CORONARY STENT INTERVENTION (PCI-S);  Surgeon: Peter M Martinique, MD;  Location: [facility] CATH LAB;  Service: Cardiovascular;;  prov LAD and mid LAD  . PERCUTANEOUS CORONARY STENT INTERVENTION (PCI-S) N/A 02/03/2015   Procedure: PERCUTANEOUS CORONARY STENT INTERVENTION (PCI-S);  Surgeon: Jettie Booze, MD;  Location: [facility] CATH LAB;  Service: Cardiovascular;  Laterality: N/A;  . TEE WITHOUT CARDIOVERSION N/A 09/02/2015   Procedure: TRANSESOPHAGEAL ECHOCARDIOGRAM (TEE);  Surgeon: Rexene Alberts, MD;  Location: [place];  Service: Open Heart Surgery;  Laterality: N/A;    History  Smoking Status  . Former Smoker  . Packs/day: 1.00  . Years: 35.00  . Types: Cigarettes  . Quit date: 12/11/2013  Smokeless Tobacco  . Never Used    Comment: pt. states that brother and friends smoke in home    History  Alcohol Use No    Family History  Problem Relation Age of Onset  . CAD Father     PTCA  . Cancer Mother     LYMPHOMA    Review of Systems: The review of systems is per the HPI.  All other systems were reviewed and are negative.  Physical Exam: BP 117/68   Pulse 73   Ht 6' 1.5" (1.867 m)   Wt 204 lb 3.2 oz (92.6 kg)   SpO2 97%   BMI 26.58 kg/m  Patient is very pleasant and in no acute distress. Skin is warm and dry. Color is normal.  HEENT is unremarkable. Normocephalic/atraumatic. PERRL. Sclera are nonicteric. Neck is  supple. No masses. No JVD. ICD site has healed well. Lungs are clear. Cardiac exam shows a regular rate and rhythm. normal S1-2. No gallop or murmur. Abdomen is soft. Extremities are without edema. Gait and ROM are intact. No gross neurologic deficits noted.  Wt Readings from Last 3 Encounters:  11/14/16 204 lb 3.2 oz (92.6 kg)  10/10/16 205 lb 6.4 oz (93.2 kg)  08/28/16 204 lb (92.5 kg)    LABORATORY DATA/PROCEDURES:  Lab Results  Component Value Date   WBC 9.9 12/24/2015   HGB 16.0 12/24/2015   HCT 46.6 12/24/2015   PLT 241 12/24/2015   GLUCOSE 88 12/24/2015   CHOL 112 (L) 12/24/2015   TRIG 79 12/24/2015   HDL 33 (L) 12/24/2015   LDLCALC 63 12/24/2015   ALT 11 12/24/2015   AST 20 12/24/2015   NA 137 12/24/2015   K 4.8 12/24/2015   CL 105 12/24/2015   CREATININE 0.93 12/24/2015   BUN 14 12/24/2015   CO2 28 12/24/2015   TSH 1.254 12/24/2015   INR 1.41 09/02/2015   HGBA1C 6.0 (H) 08/31/2015    BNP (last 3 results) No results for input(s): PROBNP in the last 8760 hours. Lab Results  Component Value Date   CKTOTAL 76 09/07/2015   CKMB 3.6 09/07/2015   TROPONINI <0.01 10/10/2016     Assessment / Plan: 1. CAD s/p anterior STEMI with extensive LAD stenting. Chronic occlusion of RCA. S/p successful CTO PCI of the RCA with multiple DES and Graftmaster stent for perforation in March 2016. NSTEMI in July related to reocclusion of RCA. Refractory angina on medical therapy. Re-evaluation of left main 50% stenosis with abnormal FFR. Now s/p CABG x 2 in September 2016.  Unfortunately RCA could not be bypassed. Will treat medically.  Clinically doing very well. Agree with addition of Imdur. Recommend continued smoking cessation. Continue ASA. Continue exercise.  2. S/p coronary perforation with hemopericardium and cardiac tamponade during CTO PCI. S/p successful pericardiocentesis and drain. Resolved. Pericarditis due to irritation of blood. Resolved.   3. Paroxysmal Afib due to  acute pericarditis. No recurrence post CABG. If he does have occurrence may need long term anticoagulation.   4. Chronic Systolic heart failure -EF 30%.  Will continue current Coreg, lisinopril, lasix, and aldactone Rx.  He appears to be at baseline volume status. Continue sodium restriction. He is not symptomatic.   5. Underlying ICD -  continue beta blocker. He is due for ICD interrogation. Instructed to call device clinic.  6. Hyperlipidemia. Well controlled on Pravachol.   7. Tobacco abuse- cessation.

## 2016-11-14 NOTE — Patient Instructions (Addendum)
Continue your current therapy  Call and have your ICD checked  I will see you in 6 months

## 2016-12-06 ENCOUNTER — Ambulatory Visit: Payer: Medicaid Other | Admitting: Cardiology

## 2016-12-16 ENCOUNTER — Other Ambulatory Visit: Payer: Self-pay | Admitting: Cardiology

## 2017-01-10 ENCOUNTER — Ambulatory Visit (INDEPENDENT_AMBULATORY_CARE_PROVIDER_SITE_OTHER): Payer: Medicaid Other | Admitting: Internal Medicine

## 2017-01-10 ENCOUNTER — Encounter: Payer: Self-pay | Admitting: Internal Medicine

## 2017-01-10 VITALS — BP 114/84 | HR 67 | Ht 73.5 in | Wt 214.2 lb

## 2017-01-10 DIAGNOSIS — Z95810 Presence of automatic (implantable) cardiac defibrillator: Secondary | ICD-10-CM | POA: Diagnosis not present

## 2017-01-10 DIAGNOSIS — I5022 Chronic systolic (congestive) heart failure: Secondary | ICD-10-CM | POA: Diagnosis not present

## 2017-01-10 NOTE — Progress Notes (Signed)
HPI [name] returns today for followup s/p ICD implantation. He is a pleasant middle aged man with known CAD, s/p MI, with an EF 30% despite maximal medical therapy after revascularization over 2 years ago.  He has class 2. CHF symptoms. In the interim, no ICD shocks. He remains active.   No Known Allergies   Current Outpatient Prescriptions  Medication Sig Dispense Refill  . acetaminophen (TYLENOL) 325 MG tablet Take 2 tablets (650 mg total) by mouth every 4 (four) hours as needed for headache or mild pain.    Marland Kitchen allopurinol (ZYLOPRIM) 300 MG tablet TAKE ONE TABLET BY MOUTH ONCE DAILY 30 tablet 3  . aspirin EC 81 MG tablet Take 81 mg by mouth daily.    Marland Kitchen atorvastatin (LIPITOR) 40 MG tablet TAKE ONE TABLET BY MOUTH ONCE DAILY AT 6PM 30 tablet 10  . carvedilol (COREG) 6.25 MG tablet Take 1 tablet (6.25 mg total) by mouth 2 (two) times daily with a meal. 180 tablet 3  . furosemide (LASIX) 20 MG tablet TAKE ONE TABLET BY MOUTH ONCE DAILY 30 tablet 6  . furosemide (LASIX) 40 MG tablet Take 40 mg by mouth daily as needed for edema.    Marland Kitchen lisinopril (PRINIVIL,ZESTRIL) 5 MG tablet TAKE ONE TABLET BY MOUTH ONCE DAILY 30 tablet 11  . NITROSTAT 0.4 MG SL tablet DISSOLVE ONE TABLET UNDER THE TONGUE EVERY 5 MINUTES AS NEEDED FOR CHEST PAIN. 25 tablet 4  . Omega-3 Fatty Acids (FISH OIL PO) Take 1 capsule by mouth daily.    Marland Kitchen spironolactone (ALDACTONE) 25 MG tablet TAKE ONE TABLET BY MOUTH AT BEDTIME 30 tablet 8  . isosorbide mononitrate (IMDUR) 30 MG 24 hr tablet Take 1 tablet (30 mg total) by mouth daily. 30 tablet 6   No current facility-administered medications for this visit.      Past Medical History:  Diagnosis Date  . Acute systolic CHF (congestive heart failure) ([place])   . AICD (automatic cardioverter/defibrillator) present   . Anxiety   . Basal cell carcinoma    left arm  . CAD (coronary artery disease)    a. 2000: s/p stent of RCA 2000 with BMS  b. 2015: STEMI s/p LHC with  old occlusion of RCA and DESx2 to LAD  c. 123456: complicated PCI on 3/2 for CTO of mid RCA with coronary perforation and cardiac tamponade requiring emergent pericardiocentesis     . Cardiac tamponade    a. 02/03/15 2/2 coronary perforation during CTO procedure. Sealed with graftmaster coated stent.   . Gout   . History of pneumonia   . HTN (hypertension)   . Hyperlipidemia   . Ischemic cardiomyopathy    a. 2D ECHO: EF 25-30%. Akinesis of the anteroseptal and  . Left main coronary artery disease   . Myocardial infarction   . Obesity   . 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 Shortness of breath dyspnea   . Tobacco abuse   . Urinary frequency     ROS:   All systems reviewed and negative except as noted in the HPI.   Past Surgical History:  Procedure Laterality Date  . CARDIAC CATHETERIZATION    . CARDIAC CATHETERIZATION N/A 06/14/2015   Procedure: Left Heart Cath and Coronary Angiography;  Surgeon: Lorretta Harp, MD;  Location: [place] CV LAB;  Service: Cardiovascular;  Laterality: N/A;  . CARDIAC CATHETERIZATION N/A 08/18/2015  Procedure: Intravascular Pressure Wire/FFR Study;  Surgeon: Peter M Martinique, MD;  Location: [place] CV LAB;  Service: Cardiovascular;  Laterality: N/A;  . COLONOSCOPY    . CORONARY ANGIOPLASTY  2000  . CORONARY ANGIOPLASTY WITH STENT PLACEMENT  2015  . CORONARY ARTERY BYPASS GRAFT N/A 09/02/2015   Procedure: CORONARY ARTERY BYPASS GRAFTING (CABG) x 2 (LIMA-LAD, SVG-Intermediate) ENDOSCOPIC GREATER SAPHENOUS VEIN HARVEST RIGHT THIGH;  Surgeon: Rexene Alberts, MD;  Location: [place];  Service: Open Heart Surgery;  Laterality: N/A;  . FOOT SURGERY    . IMPLANTABLE CARDIOVERTER DEFIBRILLATOR IMPLANT N/A 04/17/2014   Procedure: IMPLANTABLE CARDIOVERTER DEFIBRILLATOR IMPLANT;  Surgeon: Evans Lance, MD;  Location: [facility] CATH LAB;  Service: Cardiovascular;  Laterality: N/A;  . LEFT HEART  CATHETERIZATION WITH CORONARY ANGIOGRAM N/A 12/11/2013   Procedure: LEFT HEART CATHETERIZATION WITH CORONARY ANGIOGRAM;  Surgeon: Peter M Martinique, MD;  Location: [facility] CATH LAB;  Service: Cardiovascular;  Laterality: N/A;  . LEFT HEART CATHETERIZATION WITH CORONARY ANGIOGRAM N/A 01/05/2015   Procedure: LEFT HEART CATHETERIZATION WITH CORONARY ANGIOGRAM;  Surgeon: Peter M Martinique, MD;  Location: [facility] CATH LAB;  Service: Cardiovascular;  Laterality: N/A;  . PERCUTANEOUS CORONARY STENT INTERVENTION (PCI-S)  12/11/2013   Procedure: PERCUTANEOUS CORONARY STENT INTERVENTION (PCI-S);  Surgeon: Peter M Martinique, MD;  Location: [facility] CATH LAB;  Service: Cardiovascular;;  prov LAD and mid LAD  . PERCUTANEOUS CORONARY STENT INTERVENTION (PCI-S) N/A 02/03/2015   Procedure: PERCUTANEOUS CORONARY STENT INTERVENTION (PCI-S);  Surgeon: Jettie Booze, MD;  Location: [facility] CATH LAB;  Service: Cardiovascular;  Laterality: N/A;  . TEE WITHOUT CARDIOVERSION N/A 09/02/2015   Procedure: TRANSESOPHAGEAL ECHOCARDIOGRAM (TEE);  Surgeon: Rexene Alberts, MD;  Location: [place];  Service: Open Heart Surgery;  Laterality: N/A;     Family History  Problem Relation Age of Onset  . CAD Father     PTCA  . Cancer Mother     LYMPHOMA     Social History   Social History  . Marital status: Married    Spouse name: N/A  . Number of children: 2  . Years of education: N/A   Occupational History  . [employer] auction    Social History Main Topics  . Smoking status: Former Smoker    Packs/day: 1.00    Years: 35.00    Types: Cigarettes    Quit date: 12/11/2013  . Smokeless tobacco: Never Used     Comment: pt. states that brother and friends smoke in home  . Alcohol use No  . Drug use: No  . Sexual activity: Not Currently   Other Topics Concern  . Not on file   Social History Narrative  . No narrative on file     BP 114/84 (BP Location: Left Arm, Patient Position: Sitting, Cuff Size: Normal)   Pulse 67   Ht 6' 1.5" (1.867  m)   Wt 214 lb 3.2 oz (97.2 kg)   SpO2 96%   BMI 27.88 kg/m   Physical Exam:  Well appearing middle aged man, NAD HEENT: Unremarkable Neck:  No JVD, no thyromegally Back:  No CVA tenderness Lungs:  Clear with no wheezes HEART:  Regular rate rhythm, no murmurs, no rubs, no clicks Abd:  soft, positive bowel sounds, no organomegally, no rebound, no guarding Ext:  2 plus pulses, no edema, no cyanosis, no clubbing Skin:  No rashes no nodules Neuro:  CN II through XII intact, motor grossly intact  EKG - reviewed, NSR   Assess/Plan: 1. Chronic  systolic heart failure - he is doing well and his symptoms remain class 2.  2. ICD - his biotronik device is working normally. Will recheck in several months. 3. Palpitations - he has some Atrial tachy on his device interrogation. 4. CAD - he denies anginal symptoms despite being active. Will follow.   Mikle Bosworth.D.

## 2017-01-10 NOTE — Patient Instructions (Signed)
Medication Instructions:  Your physician recommends that you continue on your current medications as directed. Please refer to the Current Medication list given to you today.   Labwork: None Ordered   Testing/Procedures: None Ordered   Follow-Up: Your physician wants you to follow-up in: 1 year with Dr. [name]. You will receive a reminder letter in the mail two months in advance. If you don't receive a letter, please call our office to schedule the follow-up appointment.  Remote monitoring is used to monitor your ICD from home. This monitoring reduces the number of office visits required to check your device to one time per year. It allows [place] to keep an eye on the functioning of your device to ensure it is working properly. You are scheduled for a device check from home on 04/11/17. You may send your transmission at any time that day. If you have a wireless device, the transmission will be sent automatically. After your physician reviews your transmission, you will receive a postcard with your next transmission date.    Any Other Special Instructions Will Be Listed Below (If Applicable).     If you need a refill on your cardiac medications before your next appointment, please call your pharmacy.

## 2017-01-11 LAB — CUP PACEART INCLINIC DEVICE CHECK
HIGH POWER IMPEDANCE MEASURED VALUE: 87 Ohm
Implantable Lead Implant Date: 20150515
Implantable Lead Model: 365500
Lead Channel Impedance Value: 593 Ohm
Lead Channel Pacing Threshold Amplitude: 0.5 V
Lead Channel Pacing Threshold Pulse Width: 0.4 ms
Lead Channel Sensing Intrinsic Amplitude: 24 mV
Lead Channel Sensing Intrinsic Amplitude: 9.4 mV
MDC IDC LEAD LOCATION: 753860
MDC IDC LEAD SERIAL: 10579264
MDC IDC PG IMPLANT DT: 20150515
MDC IDC PG SERIAL: 60800912
MDC IDC SESS DTM: 20180208083710
MDC IDC STAT BRADY RV PERCENT PACED: 0 %

## 2017-01-20 ENCOUNTER — Other Ambulatory Visit: Payer: Self-pay | Admitting: Cardiology

## 2017-03-02 ENCOUNTER — Telehealth: Payer: Self-pay | Admitting: Cardiology

## 2017-03-02 NOTE — Telephone Encounter (Signed)
Spoke w/ pt b/c his home monitor has not updated in the last 21 days. Pt stated that he has been sleeping near it. He checked his monitor and it seems to have come unplugged. He plugged it back in. I informed pt that his monitor should update over the weekend. Pt verbalized understanding.

## 2017-03-19 ENCOUNTER — Telehealth (HOSPITAL_COMMUNITY): Payer: Self-pay | Admitting: Cardiology

## 2017-03-19 NOTE — Telephone Encounter (Signed)
Patient no-showed appt on 10/31/16.

## 2017-03-27 ENCOUNTER — Other Ambulatory Visit: Payer: Self-pay | Admitting: Cardiology

## 2017-04-11 ENCOUNTER — Ambulatory Visit (INDEPENDENT_AMBULATORY_CARE_PROVIDER_SITE_OTHER): Payer: Medicaid Other | Admitting: *Deleted

## 2017-04-11 DIAGNOSIS — I255 Ischemic cardiomyopathy: Secondary | ICD-10-CM | POA: Diagnosis not present

## 2017-04-11 NOTE — Progress Notes (Signed)
Remote ICD transmission.   

## 2017-04-13 ENCOUNTER — Encounter: Payer: Self-pay | Admitting: Cardiology

## 2017-04-13 LAB — CUP PACEART REMOTE DEVICE CHECK
Battery Remaining Percentage: 89 %
Brady Statistic AS VP Percent: 0 %
Brady Statistic AS VS Percent: 100 %
Date Time Interrogation Session: 20180511072506
HighPow Impedance: 80 Ohm
Implantable Lead Implant Date: 20150515
Implantable Lead Serial Number: 10579264
Lead Channel Impedance Value: 565 Ohm
Lead Channel Setting Pacing Amplitude: 2.5 V
MDC IDC LEAD LOCATION: 753860
MDC IDC LEAD MODEL: 365500
MDC IDC MSMT BATTERY VOLTAGE: 3.12 V
MDC IDC PG IMPLANT DT: 20150515
MDC IDC SET LEADCHNL RV PACING PULSEWIDTH: 0.4 ms
MDC IDC SET LEADCHNL RV SENSING SENSITIVITY: 0.8 mV
MDC IDC STAT BRADY RV PERCENT PACED: 0 %
Pulse Gen Model: 383594
Pulse Gen Serial Number: 60800912

## 2017-04-27 ENCOUNTER — Encounter: Payer: Self-pay | Admitting: Cardiology

## 2017-05-06 ENCOUNTER — Other Ambulatory Visit: Payer: Self-pay | Admitting: Cardiology

## 2017-05-07 NOTE — Telephone Encounter (Signed)
REFILL 

## 2017-05-24 ENCOUNTER — Telehealth: Payer: Self-pay | Admitting: Cardiology

## 2017-05-24 NOTE — Telephone Encounter (Signed)
New message     Pt is calling asking for a call back from RN. He said it was about his medication.

## 2017-05-24 NOTE — Telephone Encounter (Signed)
Returned call to patient. Reports he is in [place] and has a severe earache-both ears. Patient would like an antibiotic. Tried to call his PCP but they refused as he hasn't been seen over 1 year.  Discussed with Malachy Mood, LPN, Dr Doug Sou nurse. Patient advised to seek care at and urgent care there in [place].  Returned call to patient. Patient verbalized understanding and agreed with plan.

## 2017-06-14 ENCOUNTER — Encounter (HOSPITAL_COMMUNITY): Payer: Self-pay | Admitting: Emergency Medicine

## 2017-06-14 ENCOUNTER — Emergency Department (HOSPITAL_COMMUNITY): Payer: Medicaid Other

## 2017-06-14 ENCOUNTER — Emergency Department (HOSPITAL_COMMUNITY)
Admission: EM | Admit: 2017-06-14 | Discharge: 2017-06-14 | Disposition: A | Discharge status: Home / Self Care | Payer: Medicaid Other | Attending: Emergency Medicine | Admitting: Emergency Medicine

## 2017-06-14 DIAGNOSIS — Z87891 Personal history of nicotine dependence: Secondary | ICD-10-CM | POA: Insufficient documentation

## 2017-06-14 DIAGNOSIS — Z7982 Long term (current) use of aspirin: Secondary | ICD-10-CM | POA: Diagnosis not present

## 2017-06-14 DIAGNOSIS — I252 Old myocardial infarction: Secondary | ICD-10-CM | POA: Insufficient documentation

## 2017-06-14 DIAGNOSIS — I2510 Atherosclerotic heart disease of native coronary artery without angina pectoris: Secondary | ICD-10-CM | POA: Insufficient documentation

## 2017-06-14 DIAGNOSIS — I10 Essential (primary) hypertension: Secondary | ICD-10-CM | POA: Diagnosis not present

## 2017-06-14 DIAGNOSIS — Z85828 Personal history of other malignant neoplasm of skin: Secondary | ICD-10-CM | POA: Diagnosis not present

## 2017-06-14 DIAGNOSIS — H6693 Otitis media, unspecified, bilateral: Secondary | ICD-10-CM | POA: Diagnosis not present

## 2017-06-14 DIAGNOSIS — Z79899 Other long term (current) drug therapy: Secondary | ICD-10-CM | POA: Diagnosis not present

## 2017-06-14 DIAGNOSIS — I5022 Chronic systolic (congestive) heart failure: Secondary | ICD-10-CM | POA: Diagnosis not present

## 2017-06-14 DIAGNOSIS — Z95810 Presence of automatic (implantable) cardiac defibrillator: Secondary | ICD-10-CM | POA: Insufficient documentation

## 2017-06-14 DIAGNOSIS — G8929 Other chronic pain: Secondary | ICD-10-CM

## 2017-06-14 DIAGNOSIS — M25511 Pain in right shoulder: Secondary | ICD-10-CM

## 2017-06-14 MED ORDER — MELOXICAM 7.5 MG PO TABS: 7.5000 mg | ORAL_TABLET | Freq: Every day | ORAL | 0 refills | Status: DC | PRN

## 2017-06-14 MED ORDER — AMOXICILLIN-POT CLAVULANATE 875-125 MG PO TABS
1.0000 | ORAL_TABLET | Freq: Two times a day (BID) | ORAL | 0 refills | Status: DC
Start: 2017-06-14 — End: 2017-08-31

## 2017-06-14 NOTE — ED Provider Notes (Signed)
[facility] DEPT Provider Note   CSN: [id number] Arrival date & time: 06/14/17  1149  By signing my name below, I, Sonum Patel, attest that this documentation has been prepared under the direction and in the presence of [facility], PA-C. Electronically Signed: Ludger Nutting, Scribe. 06/14/17. 2:37 PM.  History   Chief Complaint Chief Complaint  Patient presents with  . Shoulder Injury  . Otalgia    The history is provided by the patient. No language interpreter was used.     HPI Comments: Kerry Mason is a 61 y.o. male who presents to the Emergency Department complaining of constant, unchanged right upper back and right shoulder pain that began this morning. Patient denies known injury or trauma to the affected area. He states the pain is worse with movement, particularly with lateral abduction. He has tried Tylenol with minimal relief. He reports a history of chronic pain with the right shoulder but denies prior surgeries or evaluation by ortho. He denies similar symptoms with prior heart related issues. He denies weakness, numbness, tingling. He denies taking anti-coagulants.  Denies CP, SOB.   He also complains of bilateral ear pain that initially began in the left but progressed to the right. He states this started a few weeks ago after a vacation and now has muffled hearing. He describes the pain as a pressure like sensation. He tried hydrogen peroxide and OTC swimmer's ear treatment without relief. He reports unspecified leftover antibiotics that he took without relief. He denies nasal congestion, sinus pressure/pain. Denies fevers, sore throat.    Past Medical History:  Diagnosis Date  . Acute systolic CHF (congestive heart failure) ([place])   . AICD (automatic cardioverter/defibrillator) present   . Anxiety   . Basal cell carcinoma    left arm  . CAD (coronary artery disease)    a. 2000: s/p stent of RCA 2000 with BMS  b. 2015: STEMI s/p LHC with old occlusion of RCA and DESx2  to LAD  c. 06/06/24: complicated PCI on 3/2 for CTO of mid RCA with coronary perforation and cardiac tamponade requiring emergent pericardiocentesis     . Cardiac tamponade    a. 02/03/15 2/2 coronary perforation during CTO procedure. Sealed with graftmaster coated stent.   . Gout   . History of pneumonia   . HTN (hypertension)   . Hyperlipidemia   . Ischemic cardiomyopathy    a. 2D ECHO: EF 25-30%. Akinesis of the anteroseptal and  . Left main coronary artery disease   . Myocardial infarction ([place])   . Obesity   . 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 Shortness of breath dyspnea   . Tobacco abuse   . Urinary frequency     Patient Active Problem List   Diagnosis Date Noted  . 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 CAD, multiple vessel 08/23/2015  . Left main coronary artery disease   . Unstable angina ([place])   . NSTEMI (non-ST elevated myocardial infarction) ([place])   . Chest pain 06/13/2015  . PAF- Amiodarone stopped, holding NSR 02/18/2015  . ISR RCA DES- plan medical Rx 02/08/2015  . CAD S/P multiple PCI- last March 9563 to RCA complicated by tamponade   . Anxiety   . Gout   . Pericardial effusion   . ICD in place 07/21/2014  . Chronic systolic CHF (congestive heart failure), NYHA class 3 ([place]) 12/14/2013  . Cardiomyopathy, ischemic-EF 25% 12/14/2013  .  HTN (hypertension) 12/14/2013  . Hyperlipidemia 12/14/2013  . Tobacco abuse 12/11/2013    Past Surgical History:  Procedure Laterality Date  . CARDIAC CATHETERIZATION    . CARDIAC CATHETERIZATION N/A 06/14/2015   Procedure: Left Heart Cath and Coronary Angiography;  Surgeon: Lorretta Harp, MD;  Location: [facility] CV LAB;  Service: Cardiovascular;  Laterality: N/A;  . CARDIAC CATHETERIZATION N/A 08/18/2015   Procedure: Intravascular Pressure Wire/FFR Study;  Surgeon: Peter M Martinique, MD;  Location: [facility] CV LAB;  Service: Cardiovascular;  Laterality: N/A;  . COLONOSCOPY     . CORONARY ANGIOPLASTY  2000  . CORONARY ANGIOPLASTY WITH STENT PLACEMENT  2015  . CORONARY ARTERY BYPASS GRAFT N/A 09/02/2015   Procedure: CORONARY ARTERY BYPASS GRAFTING (CABG) x 2 (LIMA-LAD, SVG-Intermediate) ENDOSCOPIC GREATER SAPHENOUS VEIN HARVEST RIGHT THIGH;  Surgeon: Rexene Alberts, MD;  Location: [facility];  Service: Open Heart Surgery;  Laterality: N/A;  . FOOT SURGERY    . IMPLANTABLE CARDIOVERTER DEFIBRILLATOR IMPLANT N/A 04/17/2014   Procedure: IMPLANTABLE CARDIOVERTER DEFIBRILLATOR IMPLANT;  Surgeon: Evans Lance, MD;  Location: [facility] CATH LAB;  Service: Cardiovascular;  Laterality: N/A;  . LEFT HEART CATHETERIZATION WITH CORONARY ANGIOGRAM N/A 12/11/2013   Procedure: LEFT HEART CATHETERIZATION WITH CORONARY ANGIOGRAM;  Surgeon: Peter M Martinique, MD;  Location: [facility] CATH LAB;  Service: Cardiovascular;  Laterality: N/A;  . LEFT HEART CATHETERIZATION WITH CORONARY ANGIOGRAM N/A 01/05/2015   Procedure: LEFT HEART CATHETERIZATION WITH CORONARY ANGIOGRAM;  Surgeon: Peter M Martinique, MD;  Location: [facility] CATH LAB;  Service: Cardiovascular;  Laterality: N/A;  . PERCUTANEOUS CORONARY STENT INTERVENTION (PCI-S)  12/11/2013   Procedure: PERCUTANEOUS CORONARY STENT INTERVENTION (PCI-S);  Surgeon: Peter M Martinique, MD;  Location: [facility] CATH LAB;  Service: Cardiovascular;;  prov LAD and mid LAD  . PERCUTANEOUS CORONARY STENT INTERVENTION (PCI-S) N/A 02/03/2015   Procedure: PERCUTANEOUS CORONARY STENT INTERVENTION (PCI-S);  Surgeon: Jettie Booze, MD;  Location: [facility] CATH LAB;  Service: Cardiovascular;  Laterality: N/A;  . TEE WITHOUT CARDIOVERSION N/A 09/02/2015   Procedure: TRANSESOPHAGEAL ECHOCARDIOGRAM (TEE);  Surgeon: Rexene Alberts, MD;  Location: [facility];  Service: Open Heart Surgery;  Laterality: N/A;       Home Medications    Prior to Admission medications   Medication Sig Start Date End Date Taking? Authorizing Provider  acetaminophen (TYLENOL) 325 MG tablet Take 2 tablets (650 mg total) by mouth every  4 (four) hours as needed for headache or mild pain. 06/16/15   Erlene Quan, PA-C  allopurinol (ZYLOPRIM) 300 MG tablet TAKE ONE TABLET BY MOUTH ONCE DAILY 05/07/17   Martinique, Peter M, MD  amoxicillin-clavulanate (AUGMENTIN) 875-125 MG tablet Take 1 tablet by mouth 2 (two) times daily. One po bid x 7 days 06/14/17   Cordarrius Coad, [place]  aspirin EC 81 MG tablet Take 81 mg by mouth daily.    [provider]  atorvastatin (LIPITOR) 40 MG tablet TAKE ONE TABLET BY MOUTH ONCE DAILY AT 6 PM 03/28/17   Martinique, Peter M, MD  carvedilol (COREG) 6.25 MG tablet Take 1 tablet (6.25 mg total) by mouth 2 (two) times daily with a meal. 10/20/15   Martinique, Peter M, MD  carvedilol (COREG) 6.25 MG tablet TAKE TWO TABLETS BY MOUTH TWICE DAILY WITH MEALS 01/22/17   Martinique, Peter M, MD  furosemide (LASIX) 20 MG tablet TAKE ONE TABLET BY MOUTH ONCE DAILY 12/18/16   Martinique, Peter M, MD  furosemide (LASIX) 40 MG tablet Take 40 mg by mouth daily as  needed for edema.    [provider]  isosorbide mononitrate (IMDUR) 30 MG 24 hr tablet Take 1 tablet (30 mg total) by mouth daily. 10/10/16 01/08/17  Barrett, Evelene Croon, PA-C  lisinopril (PRINIVIL,ZESTRIL) 5 MG tablet TAKE ONE TABLET BY MOUTH ONCE DAILY 11/08/16   Martinique, Peter M, MD  meloxicam (MOBIC) 7.5 MG tablet Take 1 tablet (7.5 mg total) by mouth daily as needed for pain. 06/14/17   Clayton Bibles, PA-C  NITROSTAT 0.4 MG SL tablet DISSOLVE ONE TABLET UNDER THE TONGUE EVERY 5 MINUTES AS NEEDED FOR CHEST PAIN. 07/31/16   Martinique, Peter M, MD  Omega-3 Fatty Acids (FISH OIL PO) Take 1 capsule by mouth daily.    [provider]  spironolactone (ALDACTONE) 25 MG tablet TAKE ONE TABLET BY MOUTH AT BEDTIME 09/11/16   Burtis Junes, NP    Family History Family History  Problem Relation Age of Onset  . CAD Father        PTCA  . Cancer Mother        LYMPHOMA    Social History Social History  Substance Use Topics  . Smoking status: Former Smoker    Packs/day:  1.00    Years: 35.00    Types: Cigarettes    Quit date: 12/11/2013  . Smokeless tobacco: Never Used     Comment: pt. states that brother and friends smoke in home  . Alcohol use No     Allergies   Patient has no known allergies.   Review of Systems Review of Systems  HENT: Positive for ear pain and hearing loss (muffled). Negative for congestion, sinus pain and sinus pressure.   Musculoskeletal: Positive for arthralgias.  Neurological: Negative for weakness and numbness.  All other systems reviewed and are negative.    Physical Exam Updated Vital Signs BP 100/70 (BP Location: Right Arm)   Pulse 68   Temp 98.1 F (36.7 C) (Oral)   Resp 16   SpO2 99%   Physical Exam  Constitutional: He appears well-developed and well-nourished. No distress.  HENT:  Head: Normocephalic and atraumatic.  Right Ear: Ear canal normal. Tympanic membrane is bulging.  Left Ear: Ear canal normal. Tympanic membrane is bulging.  Bilateral TMs with effusions and bulging. Canals clear  Neck: Normal range of motion. Neck supple.  Full active ROM of the neck  Cardiovascular: Normal rate, regular rhythm, normal heart sounds and intact distal pulses.   Pulmonary/Chest: Effort normal and breath sounds normal. No respiratory distress. He has no wheezes. He has no rales.  Musculoskeletal: He exhibits tenderness. He exhibits no deformity.  Mild tenderness through right trapezius. No focal bony tenderness of right shoulder. Pain with passive ROM of right shoulder particularly with abduction. Distal pulses and sensation intact. Grip strength is normal.   Neurological: He is alert. No sensory deficit.  Skin: He is not diaphoretic.  Nursing note and vitals reviewed.    ED Treatments / Results  DIAGNOSTIC STUDIES: Oxygen Saturation is 95% on RA, adequate by my interpretation.    COORDINATION OF CARE: 2:36 PM Discussed treatment plan with pt at bedside and pt agreed to plan.   Labs (all labs ordered are  listed, but only abnormal results are displayed) Labs Reviewed - No data to display  EKG  EKG Interpretation None       Radiology Dg Shoulder Right  Result Date: 06/14/2017 CLINICAL DATA:  Chronic right shoulder pain without known injury. EXAM: RIGHT SHOULDER - 2+ VIEW COMPARISON:  None. FINDINGS:  There is no evidence of fracture or dislocation. Severe narrowing of right glenohumeral joint is noted. Soft tissues are unremarkable. 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 No acute abnormality is noted. Electronically Signed   By: Marijo Conception, M.D.   On: 06/14/2017 12:30    Procedures Procedures (including critical care time)  Medications Ordered in ED Medications - No data to display   Initial Impression / Assessment and Plan / ED Course  I have reviewed the triage vital signs and the nursing notes.  Pertinent labs & imaging results that were available during my care of the patient were reviewed by me and considered in my medical decision making (see chart for details).    Afebrile, nontoxic patient with chronic right shoulder pain, currently worsened, pain with movement only.  Xray demonstrates severe degenerative changes.  No clinical e/o septic joint.  No chest pain or SOB.  Doubt referred cardiac pain.  Pt also with bilateral ear pain.  Has bilateral ear effusions.  Will treat as infectious.  Unclear what antibiotic he previously took.   D/C home with Augmentin, mobic, PCP and orthopedic follow up.   Discussed result, findings, treatment, and follow up  with patient.  Pt given return precautions.  Pt verbalizes understanding and agrees with plan.       Final Clinical Impressions(s) / ED Diagnoses   Final diagnoses:  Chronic right shoulder pain  Bilateral otitis media, unspecified otitis media type    New Prescriptions Discharge Medication List as of 06/14/2017  2:42 PM    START taking these medications   Details    amoxicillin-clavulanate (AUGMENTIN) 875-125 MG tablet Take 1 tablet by mouth 2 (two) times daily. One po bid x 7 days, Starting Thu 06/14/2017, Print    meloxicam (MOBIC) 7.5 MG tablet Take 1 tablet (7.5 mg total) by mouth daily as needed for pain., Starting Thu 06/14/2017, Print       I personally performed the services described in this documentation, which was scribed in my presence. The recorded information has been reviewed and is accurate.    Clayton Bibles, PA-C 06/14/17 1643    Long, Wonda [facility], MD 06/15/17 [phone]

## 2017-06-14 NOTE — ED Notes (Signed)
Pt here for right shoulder pain. Also c/o bilateral ear "stopped up" since [place] 3 [place] ago.

## 2017-06-14 NOTE — Discharge Instructions (Signed)
Read the information below.  Use the prescribed medication as directed.  Please discuss all new medications with your pharmacist.  You may return to the Emergency Department at any time for worsening condition or any new symptoms that concern you.     If you develop uncontrolled pain, weakness or numbness of the extremity, severe discoloration of the skin, or you are unable to use your arm, return to the ER for a recheck.     If you develop fevers, uncontrolled ear pain, bleeding or discharge from your ear, see your doctor or return for a recheck.

## 2017-06-14 NOTE — ED Triage Notes (Signed)
Pt sts right shoulder pain and ear ache; pt denies obvious injury

## 2017-07-07 ENCOUNTER — Other Ambulatory Visit: Payer: Self-pay | Admitting: Physician Assistant

## 2017-07-11 ENCOUNTER — Ambulatory Visit (INDEPENDENT_AMBULATORY_CARE_PROVIDER_SITE_OTHER): Payer: Medicaid Other | Admitting: *Deleted

## 2017-07-11 DIAGNOSIS — I255 Ischemic cardiomyopathy: Secondary | ICD-10-CM | POA: Diagnosis not present

## 2017-07-11 LAB — CUP PACEART REMOTE DEVICE CHECK
Date Time Interrogation Session: 20180814155415
Implantable Lead Implant Date: 20150515
Implantable Lead Location: 753860
Implantable Lead Model: 365500
Implantable Lead Serial Number: 10579264
Implantable Pulse Generator Implant Date: 20150515
Pulse Gen Model: 383594
Pulse Gen Serial Number: 60800912

## 2017-07-11 NOTE — Progress Notes (Signed)
Remote ICD transmission.   

## 2017-07-12 ENCOUNTER — Encounter: Payer: Self-pay | Admitting: Cardiology

## 2017-07-30 ENCOUNTER — Encounter: Payer: Self-pay | Admitting: Cardiology

## 2017-08-17 ENCOUNTER — Other Ambulatory Visit: Payer: Self-pay | Admitting: Cardiology

## 2017-08-29 ENCOUNTER — Encounter (HOSPITAL_COMMUNITY): Payer: Self-pay | Admitting: Emergency Medicine

## 2017-08-29 ENCOUNTER — Inpatient Hospital Stay (HOSPITAL_COMMUNITY)
Admission: EM | Admit: 2017-08-29 | Discharge: 2017-08-31 | DRG: 287 | Disposition: A | Discharge status: Home / Self Care | Payer: Medicaid Other | Attending: Interventional Cardiology | Admitting: Interventional Cardiology

## 2017-08-29 ENCOUNTER — Emergency Department (HOSPITAL_COMMUNITY): Payer: Medicaid Other

## 2017-08-29 DIAGNOSIS — Z23 Encounter for immunization: Secondary | ICD-10-CM

## 2017-08-29 DIAGNOSIS — I255 Ischemic cardiomyopathy: Secondary | ICD-10-CM | POA: Diagnosis present

## 2017-08-29 DIAGNOSIS — I714 Abdominal aortic aneurysm, without rupture, unspecified: Secondary | ICD-10-CM | POA: Diagnosis present

## 2017-08-29 DIAGNOSIS — I200 Unstable angina: Secondary | ICD-10-CM

## 2017-08-29 DIAGNOSIS — Z95810 Presence of automatic (implantable) cardiac defibrillator: Secondary | ICD-10-CM

## 2017-08-29 DIAGNOSIS — R590 Localized enlarged lymph nodes: Secondary | ICD-10-CM | POA: Diagnosis present

## 2017-08-29 DIAGNOSIS — Z79899 Other long term (current) drug therapy: Secondary | ICD-10-CM

## 2017-08-29 DIAGNOSIS — M549 Dorsalgia, unspecified: Secondary | ICD-10-CM | POA: Diagnosis present

## 2017-08-29 DIAGNOSIS — E785 Hyperlipidemia, unspecified: Secondary | ICD-10-CM | POA: Diagnosis present

## 2017-08-29 DIAGNOSIS — R791 Abnormal coagulation profile: Secondary | ICD-10-CM | POA: Diagnosis present

## 2017-08-29 DIAGNOSIS — M109 Gout, unspecified: Secondary | ICD-10-CM | POA: Diagnosis present

## 2017-08-29 DIAGNOSIS — I480 Paroxysmal atrial fibrillation: Secondary | ICD-10-CM | POA: Diagnosis present

## 2017-08-29 DIAGNOSIS — I208 Other forms of angina pectoris: Secondary | ICD-10-CM

## 2017-08-29 DIAGNOSIS — R079 Chest pain, unspecified: Secondary | ICD-10-CM | POA: Diagnosis present

## 2017-08-29 DIAGNOSIS — Z951 Presence of aortocoronary bypass graft: Secondary | ICD-10-CM

## 2017-08-29 DIAGNOSIS — I493 Ventricular premature depolarization: Secondary | ICD-10-CM | POA: Diagnosis present

## 2017-08-29 DIAGNOSIS — I712 Thoracic aortic aneurysm, without rupture: Secondary | ICD-10-CM | POA: Diagnosis present

## 2017-08-29 DIAGNOSIS — J449 Chronic obstructive pulmonary disease, unspecified: Secondary | ICD-10-CM | POA: Diagnosis present

## 2017-08-29 DIAGNOSIS — I110 Hypertensive heart disease with heart failure: Secondary | ICD-10-CM | POA: Diagnosis present

## 2017-08-29 DIAGNOSIS — I2510 Atherosclerotic heart disease of native coronary artery without angina pectoris: Secondary | ICD-10-CM

## 2017-08-29 DIAGNOSIS — Z955 Presence of coronary angioplasty implant and graft: Secondary | ICD-10-CM

## 2017-08-29 DIAGNOSIS — R918 Other nonspecific abnormal finding of lung field: Secondary | ICD-10-CM | POA: Diagnosis present

## 2017-08-29 DIAGNOSIS — Z7982 Long term (current) use of aspirin: Secondary | ICD-10-CM

## 2017-08-29 DIAGNOSIS — Z9861 Coronary angioplasty status: Secondary | ICD-10-CM

## 2017-08-29 DIAGNOSIS — I252 Old myocardial infarction: Secondary | ICD-10-CM

## 2017-08-29 DIAGNOSIS — J432 Centrilobular emphysema: Secondary | ICD-10-CM | POA: Diagnosis present

## 2017-08-29 DIAGNOSIS — I25119 Atherosclerotic heart disease of native coronary artery with unspecified angina pectoris: Secondary | ICD-10-CM | POA: Diagnosis present

## 2017-08-29 DIAGNOSIS — I491 Atrial premature depolarization: Secondary | ICD-10-CM | POA: Diagnosis present

## 2017-08-29 DIAGNOSIS — I7121 Aneurysm of the ascending aorta, without rupture: Secondary | ICD-10-CM | POA: Diagnosis present

## 2017-08-29 DIAGNOSIS — I25110 Atherosclerotic heart disease of native coronary artery with unstable angina pectoris: Principal | ICD-10-CM | POA: Diagnosis present

## 2017-08-29 DIAGNOSIS — F17200 Nicotine dependence, unspecified, uncomplicated: Secondary | ICD-10-CM | POA: Diagnosis present

## 2017-08-29 DIAGNOSIS — I5022 Chronic systolic (congestive) heart failure: Secondary | ICD-10-CM | POA: Diagnosis present

## 2017-08-29 HISTORY — DX: Thoracic aortic aneurysm, without rupture: I71.2

## 2017-08-29 HISTORY — DX: Dorsalgia, unspecified: M54.9

## 2017-08-29 HISTORY — DX: Chronic obstructive pulmonary disease, unspecified: J44.9

## 2017-08-29 LAB — CBC
HCT: 49.5 % (ref 39.0–52.0)
HEMATOCRIT: 46.8 % (ref 39.0–52.0)
HEMOGLOBIN: 16.3 g/dL (ref 13.0–17.0)
HEMOGLOBIN: 17 g/dL (ref 13.0–17.0)
MCH: 31.7 pg (ref 26.0–34.0)
MCH: 31.2 pg (ref 26.0–34.0)
MCHC: 34.8 g/dL (ref 30.0–36.0)
MCHC: 34.3 g/dL (ref 30.0–36.0)
MCV: 90.9 fL (ref 78.0–100.0)
MCV: 90.8 fL (ref 78.0–100.0)
PLATELETS: 209 10*3/uL (ref 150–400)
Platelets: 209 10*3/uL (ref 150–400)
RBC: 5.15 MIL/uL (ref 4.22–5.81)
RBC: 5.45 MIL/uL (ref 4.22–5.81)
RDW: 14.2 % (ref 11.5–15.5)
RDW: 14.2 % (ref 11.5–15.5)
WBC: 6.6 10*3/uL (ref 4.0–10.5)
WBC: 8.1 10*3/uL (ref 4.0–10.5)

## 2017-08-29 LAB — I-STAT TROPONIN, ED
TROPONIN I, POC: 0.01 ng/mL (ref 0.00–0.08)
Troponin i, poc: 0.01 ng/mL (ref 0.00–0.08)

## 2017-08-29 LAB — BASIC METABOLIC PANEL
ANION GAP: 7 (ref 5–15)
BUN: 14 mg/dL (ref 6–20)
CHLORIDE: 107 mmol/L (ref 101–111)
CO2: 20 mmol/L — ABNORMAL LOW (ref 22–32)
Calcium: 8.9 mg/dL (ref 8.9–10.3)
Creatinine, Ser: 0.93 mg/dL (ref 0.61–1.24)
GFR calc Af Amer: >60 mL/min (ref >60)
GFR calc non Af Amer: >60 mL/min (ref >60)
GLUCOSE: 98 mg/dL (ref 65–99)
POTASSIUM: 4.4 mmol/L (ref 3.5–5.1)
Sodium: 134 mmol/L — ABNORMAL LOW (ref 135–145)

## 2017-08-29 MED ORDER — ISOSORBIDE MONONITRATE ER 30 MG PO TB24
30.0000 mg | ORAL_TABLET | Freq: Every day | ORAL | Status: DC
  Administered 2017-08-30 – 2017-08-31 (×2): 30 mg via ORAL
  Filled 2017-08-29 (×2): qty 1

## 2017-08-29 MED ORDER — KETOROLAC TROMETHAMINE 30 MG/ML IJ SOLN
30.0000 mg | Freq: Once | INTRAMUSCULAR | Status: AC
  Administered 2017-08-29: 30 mg via INTRAVENOUS
  Filled 2017-08-29: qty 1

## 2017-08-29 MED ORDER — CARVEDILOL 6.25 MG PO TABS
6.2500 mg | ORAL_TABLET | Freq: Two times a day (BID) | ORAL | Status: DC
  Administered 2017-08-29: 6.25 mg via ORAL
  Filled 2017-08-29: qty 1

## 2017-08-29 MED ORDER — ASPIRIN EC 81 MG PO TBEC
81.0000 mg | DELAYED_RELEASE_TABLET | Freq: Every day | ORAL | Status: DC
  Administered 2017-08-30 – 2017-08-31 (×2): 81 mg via ORAL
  Filled 2017-08-29 (×2): qty 1

## 2017-08-29 MED ORDER — INFLUENZA VAC SPLIT QUAD 0.5 ML IM SUSY
0.5000 mL | PREFILLED_SYRINGE | INTRAMUSCULAR | Status: AC
  Administered 2017-08-30: 0.5 mL via INTRAMUSCULAR
  Filled 2017-08-29: qty 0.5

## 2017-08-29 MED ORDER — ATORVASTATIN CALCIUM 40 MG PO TABS
40.0000 mg | ORAL_TABLET | Freq: Every day | ORAL | Status: DC
  Administered 2017-08-30: 40 mg via ORAL
  Filled 2017-08-29: qty 1

## 2017-08-29 MED ORDER — ACETAMINOPHEN 325 MG PO TABS
650.0000 mg | ORAL_TABLET | ORAL | Status: DC | PRN
  Administered 2017-08-30 – 2017-08-31 (×3): 650 mg via ORAL
  Filled 2017-08-29 (×3): qty 2

## 2017-08-29 MED ORDER — ALLOPURINOL 300 MG PO TABS
300.0000 mg | ORAL_TABLET | Freq: Every day | ORAL | Status: DC
  Administered 2017-08-30 – 2017-08-31 (×2): 300 mg via ORAL
  Filled 2017-08-29 (×2): qty 1

## 2017-08-29 MED ORDER — HEPARIN SODIUM (PORCINE) 5000 UNIT/ML IJ SOLN
5000.0000 [IU] | Freq: Three times a day (TID) | INTRAMUSCULAR | Status: DC
  Administered 2017-08-29 – 2017-08-30 (×3): 5000 [IU] via SUBCUTANEOUS
  Filled 2017-08-29 (×3): qty 1

## 2017-08-29 MED ORDER — SPIRONOLACTONE 25 MG PO TABS
25.0000 mg | ORAL_TABLET | Freq: Every day | ORAL | Status: DC
  Administered 2017-08-29 – 2017-08-30 (×2): 25 mg via ORAL
  Filled 2017-08-29 (×3): qty 1

## 2017-08-29 MED ORDER — NITROGLYCERIN 0.4 MG SL SUBL: 0.4000 mg | SUBLINGUAL_TABLET | SUBLINGUAL | Status: DC | PRN

## 2017-08-29 MED ORDER — ONDANSETRON HCL 4 MG/2ML IJ SOLN: 4.0000 mg | Freq: Four times a day (QID) | INTRAMUSCULAR | Status: DC | PRN

## 2017-08-29 MED ORDER — PNEUMOCOCCAL VAC POLYVALENT 25 MCG/0.5ML IJ INJ
0.5000 mL | INJECTION | INTRAMUSCULAR | Status: AC
  Administered 2017-08-30: 0.5 mL via INTRAMUSCULAR
  Filled 2017-08-29: qty 0.5

## 2017-08-29 MED ORDER — LISINOPRIL 5 MG PO TABS
5.0000 mg | ORAL_TABLET | Freq: Every day | ORAL | Status: DC
Start: 2017-08-30 — End: 2017-08-30
  Administered 2017-08-30: 5 mg via ORAL
  Filled 2017-08-29: qty 1

## 2017-08-29 NOTE — ED Notes (Signed)
Resident at the bedside

## 2017-08-29 NOTE — ED Triage Notes (Signed)
Pt to ED c/o SOB accompanied by "annoying feeling in his chest," nausea, back pain, and feelings of irregular heartbeat last couple days. Pt has significant cardiac hx, including MIs and patient worried that these symptoms are similar to how he has felt in the past. Resp e/u in triage, skin warm/dry.

## 2017-08-29 NOTE — H&P (Signed)
The patient has been seen in conjunction with Reino Bellis, NP-C. All aspects of care have been considered and discussed. The patient has been personally interviewed, examined, and all clinical data has been reviewed.   Interscapular discomfort very similar to prior presentations with acute coronary syndrome.Discomfort is been present for several hours now.known ischemic cardiomyopathy with AICD. Complicated prior vascular history with multiple stents, wire perforation during CTO attempt resulting in tamponade, and and ultimately CABG. Right coronary was not grafted.  Does not appear acutely ill.  EKG is nonischemic and unchanged from prior. Evidence of old infarction pattern.  Plan observation to exclude ACS.if markers remain negative, will likely be discharged in AM for outpatient workup. If markers turn positive, he will need repeat cardiac cath.   History & Physical    Patient ID: Kerry Mason: [id number], DOB/AGE: 27-May-1956   Admit date: 08/29/2017   Primary Physician: Velna Hatchet, MD Primary Cardiologist: Martinique  Patient Profile    61 yo male with PMH of CAD s/p multiple stents, and CABG, chronic systolic heart failure s/p ICD, hypertension, hyperlipidemia and tobacco use who presented to the ED with worsening fatigue, shortness of breath and back pain.  Past Medical History   Past Medical History:  Diagnosis Date  . Acute systolic CHF (congestive heart failure) ([place])   . AICD (automatic cardioverter/defibrillator) present   . Anxiety   . Basal cell carcinoma    left arm  . CAD (coronary artery disease)    a. 2000: s/p stent of RCA 2000 with BMS  b. 2015: STEMI s/p LHC with old occlusion of RCA and DESx2 to LAD  c. 05/05/21: complicated PCI on 3/2 for CTO of mid RCA with coronary perforation and cardiac tamponade requiring emergent pericardiocentesis     . Cardiac tamponade    a. 02/03/15 2/2 coronary perforation during CTO procedure. Sealed with graftmaster  coated stent.   . Gout   . History of pneumonia   . HTN (hypertension)   . Hyperlipidemia   . Ischemic cardiomyopathy    a. 2D ECHO: EF 25-30%. Akinesis of the anteroseptal and  . Left main coronary artery disease   . Myocardial infarction ([place])   . Obesity   . 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 Shortness of breath dyspnea   . Tobacco abuse   . Urinary frequency     Past Surgical History:  Procedure Laterality Date  . CARDIAC CATHETERIZATION    . CARDIAC CATHETERIZATION N/A 06/14/2015   Procedure: Left Heart Cath and Coronary Angiography;  Surgeon: Lorretta Harp, MD;  Location: [facility];  Service: Cardiovascular;  Laterality: N/A;  . CARDIAC CATHETERIZATION N/A 08/18/2015   Procedure: Intravascular Pressure Wire/FFR Study;  Surgeon: Peter M Martinique, MD;  Location: [facility];  Service: Cardiovascular;  Laterality: N/A;  . COLONOSCOPY    . CORONARY ANGIOPLASTY  2000  . CORONARY ANGIOPLASTY WITH STENT PLACEMENT  2015  . CORONARY ARTERY BYPASS GRAFT N/A 09/02/2015   Procedure: CORONARY ARTERY BYPASS GRAFTING (CABG) x 2 (LIMA-LAD, SVG-Intermediate) ENDOSCOPIC GREATER SAPHENOUS VEIN HARVEST RIGHT THIGH;  Surgeon: Rexene Alberts, MD;  Location: [facility];  Service: Open Heart Surgery;  Laterality: N/A;  . FOOT SURGERY    . IMPLANTABLE CARDIOVERTER DEFIBRILLATOR IMPLANT N/A 04/17/2014   Procedure: IMPLANTABLE CARDIOVERTER DEFIBRILLATOR IMPLANT;  Surgeon: Evans Lance, MD;  Location: [facility] CATH LAB;  Service: Cardiovascular;  Laterality: N/A;  . LEFT HEART CATHETERIZATION WITH CORONARY ANGIOGRAM N/A 12/11/2013   Procedure: LEFT HEART CATHETERIZATION WITH CORONARY ANGIOGRAM;  Surgeon: Peter M Martinique, MD;  Location: [facility] CATH LAB;  Service: Cardiovascular;  Laterality: N/A;  . LEFT HEART CATHETERIZATION WITH CORONARY ANGIOGRAM N/A 01/05/2015   Procedure: LEFT HEART CATHETERIZATION WITH CORONARY ANGIOGRAM;  Surgeon: Peter M  Martinique, MD;  Location: [facility] CATH LAB;  Service: Cardiovascular;  Laterality: N/A;  . PERCUTANEOUS CORONARY STENT INTERVENTION (PCI-S)  12/11/2013   Procedure: PERCUTANEOUS CORONARY STENT INTERVENTION (PCI-S);  Surgeon: Peter M Martinique, MD;  Location: [facility] CATH LAB;  Service: Cardiovascular;;  prov LAD and mid LAD  . PERCUTANEOUS CORONARY STENT INTERVENTION (PCI-S) N/A 02/03/2015   Procedure: PERCUTANEOUS CORONARY STENT INTERVENTION (PCI-S);  Surgeon: Jettie Booze, MD;  Location: [facility] CATH LAB;  Service: Cardiovascular;  Laterality: N/A;  . TEE WITHOUT CARDIOVERSION N/A 09/02/2015   Procedure: TRANSESOPHAGEAL ECHOCARDIOGRAM (TEE);  Surgeon: Rexene Alberts, MD;  Location: [place];  Service: Open Heart Surgery;  Laterality: N/A;     Allergies  No Known Allergies  History of Present Illness    Kerry Mason is a 61 year old male with past medical history of CAD s/p multiple stents, and CABG, chronic systolic heart failure s/p ICD, hypertension, hyperlipidemia and tobacco use. He had remote stenting of his RCA in 2000 and was then lost to follow-up. Had an anterior STEMI in January 2015 with cath showing occluded proximal RCA with collaterals and occlusion of the proximal LAD. RCA occlusion was felt to be chronic, proximal/mid to distal LAD were stented with drug-eluting stents. EF by cath was 35%, and subsequently required ICD placement. In December 2015 he reported increasing shortness of breath and chest pain. Underwent Myoview that was high risk with large area of anterior, septal and apical scar and inferior apical ischemia. EF noted at 29%. Underwent repeat cardiac cath that showed patent stents in the LAD, but first diagonal with 80% stenosis, and continued CTO of the RCA with collaterals. Due to his anginal symptoms, underwent CTO with good angiographic result, but complication with perforation of the mid RCA. This resulted in cardiac tamponade and required emergent pericardiocentesis and removal of 1 L  of blood. Perforation was sealed with Graftmaster and covered stent to the mid RCA. Remainder of his RCA was stented with 4 additional drug-eluting stents, and did have resultant pericarditis.  Admitted in July 2016 with an STEMI, EKG showed no acute change with old anterior infarct. Troponin peaked at 2.8, and underwent cardiac cath was reocclusion of mid RCA with left-to-right collaterals. He was treated medically with the addition of Imdur. Continue to be symptomatic, and underwent FFR of the left main with 50% stenosis which was abnormal. He was referred for CABG which was done by Dr. Roxy Manns on 9/16 with LIMA to the LAD an SVG to the ramus intermediate. RCA was not bypassed due to small target, and significant scarring from prior pericarditis. Postop course was uncomplicated. Of note did develop brief PAF during period of pericarditis which resolved.  He was last seen in the office by Dr. Martinique on 12/17 and reported doing well overall. Of note did state he continued to smoke. He was followed up with Dr. [name] regarding his device on 2/18. Recent interrogation showed some episodes of atrial tachycardia, but no atrial fibrillation.  Reports over the past several weeks he has noticed he has not been at his baseline with physical activity. Tends to become more fatigued with her, and has  experienced some dyspnea on exertion. No significant chest pain, but today reports a "uncomfortable feeling" in his back between his shoulder blades. Also has noted increased episodes of palpitations specifically in the evening over the past couple of days. States that he feels this is similar to that episode of PAF he had in the past.   In the ED his labs showed stable electrolytes, POC troponin negative 2, hemoglobin 16.3. Chest x-ray was negative for acute findings. EKG showed sinus rhythm with known T-wave inversion in lateral leads. He was given a sublingual nitroglycerin without significant change in his  symptoms.z   Home Medications    Prior to Admission medications   Medication Sig Start Date End Date Taking? Authorizing Provider  allopurinol (ZYLOPRIM) 300 MG tablet TAKE ONE TABLET BY MOUTH ONCE DAILY Patient taking differently: Take 300 mg by mouth once a day 05/07/17  Yes Martinique, Peter M, MD  aspirin EC 81 MG tablet Take 81 mg by mouth daily.   Yes [provider]  atorvastatin (LIPITOR) 40 MG tablet TAKE ONE TABLET BY MOUTH ONCE DAILY AT 6 PM Patient taking differently: Take 40 mg by mouth once a day at 6 PM 03/28/17  Yes Martinique, Peter M, MD  carvedilol (COREG) 6.25 MG tablet TAKE TWO TABLETS BY MOUTH TWICE DAILY WITH MEALS Patient taking differently: Take 6.25 mg by mouth two times a day 01/22/17  Yes Martinique, Peter M, MD  furosemide (LASIX) 20 MG tablet TAKE ONE TABLET BY MOUTH ONCE DAILY Patient taking differently: Take 20 mg by mouth once a day as needed for an overnight weight gain of 1-3 pounds over a baseline weight of 200 pounds 12/18/16  Yes Martinique, Peter M, MD  furosemide (LASIX) 40 MG tablet TAKE ONE TABLET BY MOUTH ONCE DAILY Patient taking differently: Take 40 mg by mouth once a day as needed for an overnight weight gain of 3-6 pounds over a baseline weight of 200 pounds 08/17/17  Yes Martinique, Peter M, MD  isosorbide mononitrate (IMDUR) 30 MG 24 hr tablet TAKE ONE TABLET BY MOUTH ONCE DAILY Patient taking differently: Take 30 mg by mouth once a day 07/09/17  Yes Evans Lance, MD  lisinopril (PRINIVIL,ZESTRIL) 5 MG tablet TAKE ONE TABLET BY MOUTH ONCE DAILY Patient taking differently: Take 5 mg by mouth once a day 11/08/16  Yes Martinique, Peter M, MD  NITROSTAT 0.4 MG SL tablet DISSOLVE ONE TABLET UNDER THE TONGUE EVERY 5 MINUTES AS NEEDED FOR CHEST PAIN. Patient taking differently: Dissolve 1 tablet (0.4 mg) under the tongue every 5 minutes as needed for chest pain 07/31/16  Yes Martinique, Peter M, MD  Omega-3 Fatty Acids (FISH OIL PO) Take 1 capsule by mouth daily.   Yes  [provider]  spironolactone (ALDACTONE) 25 MG tablet TAKE ONE TABLET BY MOUTH AT BEDTIME Patient taking differently: Take 25 mg by mouth at bedtime 09/11/16  Yes Burtis Junes, NP  acetaminophen (TYLENOL) 325 MG tablet Take 2 tablets (650 mg total) by mouth every 4 (four) hours as needed for headache or mild pain. Patient not taking: Reported on 08/29/2017 06/16/15   Erlene Quan, PA-C  amoxicillin-clavulanate (AUGMENTIN) 875-125 MG tablet Take 1 tablet by mouth 2 (two) times daily. One po bid x 7 days Patient not taking: Reported on 08/29/2017 06/14/17   Clayton Bibles, PA-C  carvedilol (COREG) 6.25 MG tablet Take 1 tablet (6.25 mg total) by mouth 2 (two) times daily with a meal. Patient not taking: Reported on 08/29/2017  10/20/15   Martinique, Peter M, MD  meloxicam (MOBIC) 7.5 MG tablet Take 1 tablet (7.5 mg total) by mouth daily as needed for pain. Patient not taking: Reported on 08/29/2017 06/14/17   Clayton Bibles, PA-C    Family History     Family History  Problem Relation Age of Onset  . CAD Father        PTCA  . Cancer Mother        LYMPHOMA    Social History    Social History   Social History  . Marital status: Married    Spouse name: N/A  . Number of children: 2  . Years of education: N/A   Occupational History  . [employer] auction    Social History Main Topics  . Smoking status: Former Smoker    Packs/day: 1.00    Years: 35.00    Types: Cigarettes    Quit date: 12/11/2013  . Smokeless tobacco: Never Used     Comment: pt. states that brother and friends smoke in home  . Alcohol use No  . Drug use: No  . Sexual activity: Not Currently   Other Topics Concern  . Not on file   Social History Narrative  . No narrative on file     Review of Systems    See history of present illness  All other systems reviewed and are otherwise negative except as noted above.  Physical Exam    Blood pressure (!) 138/91, pulse 67, temperature 98.4 F (36.9 C),  temperature source Oral, resp. rate 18, height 6\' 1"  (1.854 m), weight 204 lb (92.5 kg), SpO2 95 %.  General: Pleasant, NAD Psych: Normal affect. Neuro: Alert and oriented X 3. Moves all extremities spontaneously. HEENT: Normal  Neck: Supple without bruits or JVD. Lungs:  Resp regular and unlabored, CTA. Heart: RRR no s3, s4, or murmurs. Abdomen: Soft, non-tender, non-distended, BS + x 4.  Extremities: No clubbing, cyanosis or edema. DP/PT/Radials 2+ and equal bilaterally.  Labs    Troponin (Point of Care Test)  Recent Labs  08/29/17 1438  TROPIPOC 0.01   No results for input(s): CKTOTAL, CKMB, TROPONINI in the last 72 hours. Lab Results  Component Value Date   WBC 6.6 08/29/2017   HGB 16.3 08/29/2017   HCT 46.8 08/29/2017   MCV 90.9 08/29/2017   PLT 209 08/29/2017    Recent Labs Lab 08/29/17 1424  NA 134*  K 4.4  CL 107  CO2 20*  BUN 14  CREATININE 0.93  CALCIUM 8.9  GLUCOSE 98   Lab Results  Component Value Date   CHOL 112 (L) 12/24/2015   HDL 33 (L) 12/24/2015   LDLCALC 63 12/24/2015   TRIG 79 12/24/2015   No results found for: [facility]   Radiology Studies    Dg Chest 2 View  Result Date: 08/29/2017 CLINICAL DATA:  Shortness of breath with exertion EXAM: CHEST  2 VIEW COMPARISON:  10/04/2015 FINDINGS: Hyperinflation. Post sternotomy changes. Right-sided pacing device as before. Stable cardiomediastinal silhouette. No pneumothorax. IMPRESSION: No active cardiopulmonary disease. Electronically Signed   By: Donavan Foil M.D.   On: 08/29/2017 15:36    ECG & Cardiac Imaging    EKG: sinus rhythm with known T-wave inversion in lateral leads  Echo: 08/25/15  Study Conclusions  - Left ventricle: The cavity size was normal. Wall thickness was   increased in a pattern of mild LVH. Systolic function was   moderately to severely reduced. The estimated ejection fraction  was in the range of 30% to 35%. Abnormal GLPSS at -15%, inferior   predominence.  Inferior, inferoapical, apical and distal septal   akinesis. Doppler parameters are consistent with abnormal left   ventricular relaxation (grade 1 diastolic dysfunction). The E/e&'   ratio is <8, suggesting normal LV filling pressure. - Mitral valve: Calcified annulus. There was trivial regurgitation. - Left atrium: The atrium was at the upper limits of normal in   size. - Right ventricle: The cavity size was mildly dilated. Pacer wire   or catheter noted in right ventricle. Systolic function was   normal. - Right atrium: The atrium was mildly dilated. Pacer wire or   catheter noted in right atrium. - Inferior vena cava: The vessel was normal in size. The   respirophasic diameter changes were in the normal range (>= 50%),   consistent with normal central venous pressure.  Impressions:  - Compared to a prior echo in 02/2015, the EF has improved to   30-35%. There is akinesis of the apex, distal septum and   inferoapical walls. AICD leads are in place. There is no   pericardial effusion.  Assessment & Plan    61 yo male with PMH of CAD s/p multiple stents, and CABG, chronic systolic heart failure s/p ICD, hypertension, hyperlipidemia and tobacco use who presented to the ED with worsening fatigue, shortness of breath and back pain.  1. Back pain/fatigue: Reports ongoing fatigue, and dyspnea on exertion over the past couple weeks. States this morning he developed significant discomfort between her shoulder blades, which is similar to his symptoms with previous cardiac events. States he has never had chest pain with his previous stents, or bypass. Given sublingual nitroglycerin in the ED, with no significant change in symptoms. POC troponin negative 2. EKG shows sinus rhythm with known T-wave inversion in lateral leads.  -- admit to telemetry -- cycle enzymes, if positive then would need cardiac cath. If negative and pain resolves then possible DC in the morning. Pain seems somewhat  musculoskeletal in nature, will give a dose of Toradol now to assess whether symptoms improve.   2. Chronic systolic heart failure s/p ICD: denies any orthopnea, PND or lower extremity edema. Appear volume overloaded on exam.  3. Hypertension: reports that his blood pressure is mostly controlled at home. Noted to be mildly elevated while in the ED.  4. Hyperlipidemia: reports being compliant with his home statin  5. PAF: this was noted with his pericarditis after developing cardiac tamponade from wire perforation. Device was recently interrogated earlier this year noting atrial tachycardia, but no PAF. Does report ongoing symptoms of palpitations in the evening over the past several days. -- monitor on telemetry  6. Tobacco use: Patient reports he continues to smoke. Cessation advised    Signed, Reino Bellis, NP-C Pager [phone] 08/29/2017, 6:08 PM

## 2017-08-29 NOTE — ED Notes (Signed)
Attempted to call report to 3E. RN will busy and will call me back.

## 2017-08-29 NOTE — ED Notes (Signed)
Got patient undress on the monitor patient is resting with call bell in reach 

## 2017-08-29 NOTE — ED Notes (Signed)
Cardiology at bedside.

## 2017-08-29 NOTE — ED Provider Notes (Signed)
[place] DEPT Provider Note   CSN: [id number] Arrival date & time: 08/29/17  1405     History   Chief Complaint Chief Complaint  Patient presents with  . Shortness of Breath    HPI Kerry Mason is a 61 y.o. male.  61 year old with extensive CAD history S/P stents and CABG followed by Dr. Martinique of Cardiology, ischemic cardiomyopathy (EF 30-35%), and tobacco abuse who presents with chest discomfort similar of previous cardiac events.  Onset throughout the day of cramping underneath right shoulder blade and right chest pressure.  Worsened with activity.  Pt states pain similar to previous MI. CABG performed 2 years ago without recent interrogation.  Mild improvement with nitroglycerin.  Versus mild shortness of breath.  No previous DVT or PE.  Denies recent fevers, cough, vomiting, or other symptoms.  The history is provided by the patient and medical records. No language interpreter was used.    Past Medical History:  Diagnosis Date  . Acute systolic CHF (congestive heart failure) ([place])   . AICD (automatic cardioverter/defibrillator) present   . Anxiety   . Basal cell carcinoma    left arm  . CAD (coronary artery disease)    a. 2000: s/p stent of RCA 2000 with BMS  b. 2015: STEMI s/p LHC with old occlusion of RCA and DESx2 to LAD  c. 07/08/01: complicated PCI on 3/2 for CTO of mid RCA with coronary perforation and cardiac tamponade requiring emergent pericardiocentesis     . Cardiac tamponade    a. 02/03/15 2/2 coronary perforation during CTO procedure. Sealed with graftmaster coated stent.   . Gout   . History of pneumonia   . HTN (hypertension)   . Hyperlipidemia   . Ischemic cardiomyopathy    a. 2D ECHO: EF 25-30%. Akinesis of the anteroseptal and  . Left main coronary artery disease   . Myocardial infarction ([place])   . Obesity   . 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 Shortness of breath dyspnea   . Tobacco  abuse   . Urinary frequency     Patient Active Problem List   Diagnosis Date Noted  . 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 CAD, multiple vessel 08/23/2015  . Left main coronary artery disease   . Unstable angina ([place])   . NSTEMI (non-ST elevated myocardial infarction) ([place])   . Chest pain 06/13/2015  . PAF- Amiodarone stopped, holding NSR 02/18/2015  . ISR RCA DES- plan medical Rx 02/08/2015  . CAD S/P multiple PCI- last March 7741 to RCA complicated by tamponade   . Anxiety   . Gout   . Pericardial effusion   . ICD in place 07/21/2014  . Chronic systolic CHF (congestive heart failure), NYHA class 3 ([place]) 12/14/2013  . Cardiomyopathy, ischemic-EF 25% 12/14/2013  . HTN (hypertension) 12/14/2013  . Hyperlipidemia 12/14/2013  . Tobacco abuse 12/11/2013    Past Surgical History:  Procedure Laterality Date  . CARDIAC CATHETERIZATION    . CARDIAC CATHETERIZATION N/A 06/14/2015   Procedure: Left Heart Cath and Coronary Angiography;  Surgeon: Lorretta Harp, MD;  Location: [place] CV LAB;  Service: Cardiovascular;  Laterality: N/A;  . CARDIAC CATHETERIZATION N/A 08/18/2015   Procedure: Intravascular Pressure Wire/FFR Study;  Surgeon: Peter M Martinique, MD;  Location: [place] CV LAB;  Service: Cardiovascular;  Laterality: N/A;  . COLONOSCOPY    . CORONARY ANGIOPLASTY  2000  . CORONARY ANGIOPLASTY  WITH STENT PLACEMENT  2015  . CORONARY ARTERY BYPASS GRAFT N/A 09/02/2015   Procedure: CORONARY ARTERY BYPASS GRAFTING (CABG) x 2 (LIMA-LAD, SVG-Intermediate) ENDOSCOPIC GREATER SAPHENOUS VEIN HARVEST RIGHT THIGH;  Surgeon: Rexene Alberts, MD;  Location: [facility];  Service: Open Heart Surgery;  Laterality: N/A;  . FOOT SURGERY    . IMPLANTABLE CARDIOVERTER DEFIBRILLATOR IMPLANT N/A 04/17/2014   Procedure: IMPLANTABLE CARDIOVERTER DEFIBRILLATOR IMPLANT;  Surgeon: Evans Lance, MD;  Location: [facility] CATH LAB;  Service: Cardiovascular;  Laterality: N/A;  . LEFT HEART CATHETERIZATION WITH CORONARY ANGIOGRAM  N/A 12/11/2013   Procedure: LEFT HEART CATHETERIZATION WITH CORONARY ANGIOGRAM;  Surgeon: Peter M Martinique, MD;  Location: [facility] CATH LAB;  Service: Cardiovascular;  Laterality: N/A;  . LEFT HEART CATHETERIZATION WITH CORONARY ANGIOGRAM N/A 01/05/2015   Procedure: LEFT HEART CATHETERIZATION WITH CORONARY ANGIOGRAM;  Surgeon: Peter M Martinique, MD;  Location: [facility] CATH LAB;  Service: Cardiovascular;  Laterality: N/A;  . PERCUTANEOUS CORONARY STENT INTERVENTION (PCI-S)  12/11/2013   Procedure: PERCUTANEOUS CORONARY STENT INTERVENTION (PCI-S);  Surgeon: Peter M Martinique, MD;  Location: [facility] CATH LAB;  Service: Cardiovascular;;  prov LAD and mid LAD  . PERCUTANEOUS CORONARY STENT INTERVENTION (PCI-S) N/A 02/03/2015   Procedure: PERCUTANEOUS CORONARY STENT INTERVENTION (PCI-S);  Surgeon: Jettie Booze, MD;  Location: [facility] CATH LAB;  Service: Cardiovascular;  Laterality: N/A;  . TEE WITHOUT CARDIOVERSION N/A 09/02/2015   Procedure: TRANSESOPHAGEAL ECHOCARDIOGRAM (TEE);  Surgeon: Rexene Alberts, MD;  Location: [place];  Service: Open Heart Surgery;  Laterality: N/A;       Home Medications    Prior to Admission medications   Medication Sig Start Date End Date Taking? Authorizing Provider  allopurinol (ZYLOPRIM) 300 MG tablet TAKE ONE TABLET BY MOUTH ONCE DAILY Patient taking differently: Take 300 mg by mouth once a day 05/07/17  Yes Martinique, Peter M, MD  aspirin EC 81 MG tablet Take 81 mg by mouth daily.   Yes [provider]  atorvastatin (LIPITOR) 40 MG tablet TAKE ONE TABLET BY MOUTH ONCE DAILY AT 6 PM Patient taking differently: Take 40 mg by mouth once a day at 6 PM 03/28/17  Yes Martinique, Peter M, MD  carvedilol (COREG) 6.25 MG tablet TAKE TWO TABLETS BY MOUTH TWICE DAILY WITH MEALS Patient taking differently: Take 6.25 mg by mouth two times a day 01/22/17  Yes Martinique, Peter M, MD  furosemide (LASIX) 20 MG tablet TAKE ONE TABLET BY MOUTH ONCE DAILY Patient taking differently: Take 20 mg by mouth once a day as  needed for an overnight weight gain of 1-3 pounds over a baseline weight of 200 pounds 12/18/16  Yes Martinique, Peter M, MD  furosemide (LASIX) 40 MG tablet TAKE ONE TABLET BY MOUTH ONCE DAILY Patient taking differently: Take 40 mg by mouth once a day as needed for an overnight weight gain of 3-6 pounds over a baseline weight of 200 pounds 08/17/17  Yes Martinique, Peter M, MD  isosorbide mononitrate (IMDUR) 30 MG 24 hr tablet TAKE ONE TABLET BY MOUTH ONCE DAILY Patient taking differently: Take 30 mg by mouth once a day 07/09/17  Yes Evans Lance, MD  lisinopril (PRINIVIL,ZESTRIL) 5 MG tablet TAKE ONE TABLET BY MOUTH ONCE DAILY Patient taking differently: Take 5 mg by mouth once a day 11/08/16  Yes Martinique, Peter M, MD  NITROSTAT 0.4 MG SL tablet DISSOLVE ONE TABLET UNDER THE TONGUE EVERY 5 MINUTES AS NEEDED FOR CHEST PAIN. Patient taking differently: Dissolve 1 tablet (0.4 mg) under  the tongue every 5 minutes as needed for chest pain 07/31/16  Yes Martinique, Peter M, MD  Omega-3 Fatty Acids (FISH OIL PO) Take 1 capsule by mouth daily.   Yes [provider]  spironolactone (ALDACTONE) 25 MG tablet TAKE ONE TABLET BY MOUTH AT BEDTIME Patient taking differently: Take 25 mg by mouth at bedtime 09/11/16  Yes Burtis Junes, NP  acetaminophen (TYLENOL) 325 MG tablet Take 2 tablets (650 mg total) by mouth every 4 (four) hours as needed for headache or mild pain. Patient not taking: Reported on 08/29/2017 06/16/15   Erlene Quan, PA-C  amoxicillin-clavulanate (AUGMENTIN) 875-125 MG tablet Take 1 tablet by mouth 2 (two) times daily. One po bid x 7 days Patient not taking: Reported on 08/29/2017 06/14/17   Clayton Bibles, PA-C  carvedilol (COREG) 6.25 MG tablet Take 1 tablet (6.25 mg total) by mouth 2 (two) times daily with a meal. Patient not taking: Reported on 08/29/2017 10/20/15   Martinique, Peter M, MD  meloxicam (MOBIC) 7.5 MG tablet Take 1 tablet (7.5 mg total) by mouth daily as needed for pain. Patient not  taking: Reported on 08/29/2017 06/14/17   Clayton Bibles, PA-C    Family History Family History  Problem Relation Age of Onset  . CAD Father        PTCA  . Cancer Mother        LYMPHOMA    Social History Social History  Substance Use Topics  . Smoking status: Former Smoker    Packs/day: 1.00    Years: 35.00    Types: Cigarettes    Quit date: 12/11/2013  . Smokeless tobacco: Never Used     Comment: pt. states that brother and friends smoke in home  . Alcohol use No     Allergies   Patient has no known allergies.   Review of Systems Review of Systems  Constitutional: Negative for chills and fever.  HENT: Negative for ear pain and sore throat.   Eyes: Negative for pain and visual disturbance.  Respiratory: Positive for shortness of breath. Negative for cough.   Cardiovascular: Positive for chest pain. Negative for palpitations.  Gastrointestinal: Negative for abdominal pain and vomiting.  Genitourinary: Negative for dysuria and hematuria.  Musculoskeletal: Negative for arthralgias and back pain.  Skin: Negative for color change and rash.  Neurological: Negative for seizures and syncope.  All other systems reviewed and are negative.    Physical Exam Updated Vital Signs BP (!) 151/84 (BP Location: Right Arm)   Pulse (!) 56   Temp 98 F (36.7 C) (Oral)   Resp (!) 22   Ht 6\' 1"  (1.854 m)   Wt 92.2 kg (203 lb 4.2 oz) Comment: scale a  SpO2 96%   BMI 26.82 kg/m   Physical Exam  Constitutional: He appears well-developed and well-nourished.  HENT:  Head: Normocephalic and atraumatic.  Eyes: Conjunctivae are normal.  Neck: Neck supple.  Cardiovascular: Normal rate and regular rhythm.   No murmur heard. Healed midline chest scar from previous CABG  Pulmonary/Chest: Effort normal and breath sounds normal. No respiratory distress.  Abdominal: Soft. There is no tenderness.  Musculoskeletal: He exhibits no edema.  Neurological: He is alert. No cranial nerve deficit.  Coordination normal.  5/5 motor strength and intact sensation in all extremities. Intact bilateral finger-to-nose coordination  Skin: Skin is warm and dry.  Nursing note and vitals reviewed.    ED Treatments / Results  Labs (all labs ordered are listed, but only abnormal  results are displayed) Labs Reviewed  BASIC METABOLIC PANEL - Abnormal; Notable for the following:       Result Value   Sodium 134 (*)    CO2 20 (*)    All other components within normal limits  CBC  CBC  CREATININE, SERUM  TROPONIN I  HIV ANTIBODY (ROUTINE TESTING)  TROPONIN I  TROPONIN I  BASIC METABOLIC PANEL  I-STAT TROPONIN, ED  I-STAT TROPONIN, ED    EKG  EKG Interpretation  Date/Time:  Wednesday August 29 2017 14:19:46 EDT Ventricular Rate:  75 PR Interval:  140 QRS Duration: 104 QT Interval:  384 QTC Calculation: 428 R Axis:   104 Text Interpretation:  Normal sinus rhythm T wave inversions lateral leads, similar to prior/.  Confirmed by Zenovia Jarred [phone]) on 08/29/2017 3:45:27 PM       Radiology Dg Chest 2 View  Result Date: 08/29/2017 CLINICAL DATA:  Shortness of breath with exertion EXAM: CHEST  2 VIEW COMPARISON:  10/04/2015 FINDINGS: Hyperinflation. Post sternotomy changes. Right-sided pacing device as before. Stable cardiomediastinal silhouette. No pneumothorax. IMPRESSION: No active cardiopulmonary disease. Electronically Signed   By: Donavan Foil M.D.   On: 08/29/2017 15:36    Procedures Procedures (including critical care time)  Medications Ordered in ED Medications  nitroGLYCERIN (NITROSTAT) SL tablet 0.4 mg (not administered)  acetaminophen (TYLENOL) tablet 650 mg (not administered)  ondansetron (ZOFRAN) injection 4 mg (not administered)  heparin injection 5,000 Units (5,000 Units Subcutaneous Given 08/29/17 2333)  isosorbide mononitrate (IMDUR) 24 hr tablet 30 mg (not administered)  allopurinol (ZYLOPRIM) tablet 300 mg (not administered)  atorvastatin (LIPITOR)  tablet 40 mg (not administered)  carvedilol (COREG) tablet 6.25 mg (6.25 mg Oral Given 08/29/17 2333)  lisinopril (PRINIVIL,ZESTRIL) tablet 5 mg (not administered)  spironolactone (ALDACTONE) tablet 25 mg (25 mg Oral Given 08/29/17 2333)  aspirin EC tablet 81 mg (not administered)  Influenza vac split quadrivalent PF (FLUARIX) injection 0.5 mL (not administered)  pneumococcal 23 valent vaccine (PNU-IMMUNE) injection 0.5 mL (not administered)  ketorolac (TORADOL) 30 MG/ML injection 30 mg (30 mg Intravenous Given 08/29/17 2025)     Initial Impression / Assessment and Plan / ED Course  I have reviewed the triage vital signs and the nursing notes.  Pertinent labs & imaging results that were available during my care of the patient were reviewed by me and considered in my medical decision making (see chart for details).     28 yoM h/o CAD s/p stents and CABG, CHF (EF 30-35%) who p/w chest discomfort concerning for anginal equivalent. Symptoms similar to previous MI. Currently with mild symptoms. Lungs CTAB. AF, VSS.  EKG showing NSR with t wave inversions of lateral leads similar to prior. CXR showing NACPA. Troponins wnl x2.   Cardiology consulted and evaluated pt. Cardiology admitted pt for further management and evaluation. Pt stable at time of transfer.  Pt care d/w Dr. Thomasene Lot  Final Clinical Impressions(s) / ED Diagnoses   Final diagnoses:  Angina at rest [facility])  Coronary artery disease involving native coronary artery of native heart with angina pectoris [facility])    New Prescriptions Current Discharge Medication List       Payton Emerald, MD 08/30/17 0146    Macarthur Critchley, MD 08/30/17 1614

## 2017-08-30 ENCOUNTER — Encounter (HOSPITAL_COMMUNITY)
Admission: EM | Disposition: A | Discharge status: Home / Self Care | Payer: Self-pay | Source: Home / Self Care | Attending: Interventional Cardiology

## 2017-08-30 ENCOUNTER — Other Ambulatory Visit: Payer: Self-pay

## 2017-08-30 DIAGNOSIS — R918 Other nonspecific abnormal finding of lung field: Secondary | ICD-10-CM | POA: Diagnosis present

## 2017-08-30 DIAGNOSIS — I255 Ischemic cardiomyopathy: Secondary | ICD-10-CM | POA: Diagnosis present

## 2017-08-30 DIAGNOSIS — I252 Old myocardial infarction: Secondary | ICD-10-CM | POA: Diagnosis not present

## 2017-08-30 DIAGNOSIS — E785 Hyperlipidemia, unspecified: Secondary | ICD-10-CM | POA: Diagnosis present

## 2017-08-30 DIAGNOSIS — J441 Chronic obstructive pulmonary disease with (acute) exacerbation: Secondary | ICD-10-CM

## 2017-08-30 DIAGNOSIS — I2583 Coronary atherosclerosis due to lipid rich plaque: Secondary | ICD-10-CM | POA: Diagnosis present

## 2017-08-30 DIAGNOSIS — I712 Thoracic aortic aneurysm, without rupture: Secondary | ICD-10-CM | POA: Diagnosis present

## 2017-08-30 DIAGNOSIS — Z955 Presence of coronary angioplasty implant and graft: Secondary | ICD-10-CM | POA: Diagnosis not present

## 2017-08-30 DIAGNOSIS — F17200 Nicotine dependence, unspecified, uncomplicated: Secondary | ICD-10-CM | POA: Diagnosis present

## 2017-08-30 DIAGNOSIS — R791 Abnormal coagulation profile: Secondary | ICD-10-CM | POA: Diagnosis present

## 2017-08-30 DIAGNOSIS — Z79899 Other long term (current) drug therapy: Secondary | ICD-10-CM | POA: Diagnosis not present

## 2017-08-30 DIAGNOSIS — I110 Hypertensive heart disease with heart failure: Secondary | ICD-10-CM | POA: Diagnosis present

## 2017-08-30 DIAGNOSIS — I25119 Atherosclerotic heart disease of native coronary artery with unspecified angina pectoris: Secondary | ICD-10-CM | POA: Diagnosis not present

## 2017-08-30 DIAGNOSIS — I10 Essential (primary) hypertension: Secondary | ICD-10-CM | POA: Diagnosis not present

## 2017-08-30 DIAGNOSIS — R590 Localized enlarged lymph nodes: Secondary | ICD-10-CM | POA: Diagnosis present

## 2017-08-30 DIAGNOSIS — I491 Atrial premature depolarization: Secondary | ICD-10-CM | POA: Diagnosis present

## 2017-08-30 DIAGNOSIS — I5022 Chronic systolic (congestive) heart failure: Secondary | ICD-10-CM | POA: Diagnosis present

## 2017-08-30 DIAGNOSIS — I480 Paroxysmal atrial fibrillation: Secondary | ICD-10-CM | POA: Diagnosis present

## 2017-08-30 DIAGNOSIS — I25110 Atherosclerotic heart disease of native coronary artery with unstable angina pectoris: Principal | ICD-10-CM

## 2017-08-30 DIAGNOSIS — I2510 Atherosclerotic heart disease of native coronary artery without angina pectoris: Secondary | ICD-10-CM | POA: Diagnosis present

## 2017-08-30 DIAGNOSIS — M109 Gout, unspecified: Secondary | ICD-10-CM | POA: Diagnosis present

## 2017-08-30 DIAGNOSIS — E782 Mixed hyperlipidemia: Secondary | ICD-10-CM | POA: Diagnosis not present

## 2017-08-30 DIAGNOSIS — I209 Angina pectoris, unspecified: Secondary | ICD-10-CM | POA: Diagnosis present

## 2017-08-30 DIAGNOSIS — Z95810 Presence of automatic (implantable) cardiac defibrillator: Secondary | ICD-10-CM | POA: Diagnosis not present

## 2017-08-30 DIAGNOSIS — Z23 Encounter for immunization: Secondary | ICD-10-CM | POA: Diagnosis not present

## 2017-08-30 DIAGNOSIS — I493 Ventricular premature depolarization: Secondary | ICD-10-CM | POA: Diagnosis present

## 2017-08-30 DIAGNOSIS — R0602 Shortness of breath: Secondary | ICD-10-CM | POA: Diagnosis not present

## 2017-08-30 DIAGNOSIS — M549 Dorsalgia, unspecified: Secondary | ICD-10-CM | POA: Diagnosis present

## 2017-08-30 DIAGNOSIS — Z7982 Long term (current) use of aspirin: Secondary | ICD-10-CM | POA: Diagnosis not present

## 2017-08-30 DIAGNOSIS — J432 Centrilobular emphysema: Secondary | ICD-10-CM | POA: Diagnosis present

## 2017-08-30 DIAGNOSIS — R072 Precordial pain: Secondary | ICD-10-CM | POA: Diagnosis not present

## 2017-08-30 DIAGNOSIS — Z951 Presence of aortocoronary bypass graft: Secondary | ICD-10-CM | POA: Diagnosis not present

## 2017-08-30 HISTORY — PX: LEFT HEART CATH AND CORS/GRAFTS ANGIOGRAPHY: CATH118250

## 2017-08-30 LAB — BASIC METABOLIC PANEL
ANION GAP: 9 (ref 5–15)
BUN: 17 mg/dL (ref 6–20)
CALCIUM: 8.8 mg/dL — AB (ref 8.9–10.3)
CO2: 22 mmol/L (ref 22–32)
Chloride: 105 mmol/L (ref 101–111)
Creatinine, Ser: 1 mg/dL (ref 0.61–1.24)
GFR calc non Af Amer: >60 mL/min (ref >60)
Glucose, Bld: 74 mg/dL (ref 65–99)
POTASSIUM: 4.1 mmol/L (ref 3.5–5.1)
SODIUM: 136 mmol/L (ref 135–145)

## 2017-08-30 LAB — CREATININE, SERUM
Creatinine, Ser: 1.1 mg/dL (ref 0.61–1.24)
GFR calc Af Amer: >60 mL/min (ref >60)

## 2017-08-30 LAB — TROPONIN I: Troponin I: <0.03 ng/mL (ref <0.03)

## 2017-08-30 LAB — HIV ANTIBODY (ROUTINE TESTING W REFLEX): HIV Screen 4th Generation wRfx: NONREACTIVE

## 2017-08-30 LAB — D-DIMER, QUANTITATIVE (NOT AT ARMC): D DIMER QUANT: 0.68 ug{FEU}/mL — AB (ref 0.00–0.50)

## 2017-08-30 LAB — BRAIN NATRIURETIC PEPTIDE: B NATRIURETIC PEPTIDE 5: 200.2 pg/mL — AB (ref 0.0–100.0)

## 2017-08-30 SURGERY — LEFT HEART CATH AND CORS/GRAFTS ANGIOGRAPHY
Anesthesia: LOCAL

## 2017-08-30 MED ORDER — IOPAMIDOL (ISOVUE-370) INJECTION 76%
INTRAVENOUS | Status: AC
  Filled 2017-08-30: qty 125

## 2017-08-30 MED ORDER — CARVEDILOL 3.125 MG PO TABS
3.1250 mg | ORAL_TABLET | Freq: Two times a day (BID) | ORAL | Status: DC
  Administered 2017-08-31: 3.125 mg via ORAL
  Filled 2017-08-30 (×2): qty 1

## 2017-08-30 MED ORDER — HEPARIN (PORCINE) IN NACL 2-0.9 UNIT/ML-% IJ SOLN
INTRAMUSCULAR | Status: DC | PRN
  Administered 2017-08-30: 16:00:00

## 2017-08-30 MED ORDER — IPRATROPIUM-ALBUTEROL 0.5-2.5 (3) MG/3ML IN SOLN: 3.0000 mL | Freq: Four times a day (QID) | RESPIRATORY_TRACT | Status: DC

## 2017-08-30 MED ORDER — HEPARIN SODIUM (PORCINE) 1000 UNIT/ML IJ SOLN
INTRAMUSCULAR | Status: DC | PRN
  Administered 2017-08-30: 5000 [IU] via INTRAVENOUS

## 2017-08-30 MED ORDER — HEPARIN (PORCINE) IN NACL 2-0.9 UNIT/ML-% IJ SOLN
INTRAMUSCULAR | Status: AC
  Filled 2017-08-30: qty 1000

## 2017-08-30 MED ORDER — LIDOCAINE HCL 2 % IJ SOLN
INTRAMUSCULAR | Status: AC
  Filled 2017-08-30: qty 10

## 2017-08-30 MED ORDER — SODIUM CHLORIDE 0.9 % WEIGHT BASED INFUSION: 3.0000 mL/kg/h | INTRAVENOUS | Status: DC

## 2017-08-30 MED ORDER — VERAPAMIL HCL 2.5 MG/ML IV SOLN
INTRAVENOUS | Status: DC | PRN
  Administered 2017-08-30: 16:00:00 via INTRA_ARTERIAL

## 2017-08-30 MED ORDER — ALPRAZOLAM 0.25 MG PO TABS
0.2500 mg | ORAL_TABLET | Freq: Every evening | ORAL | Status: DC | PRN
  Administered 2017-08-30: 0.25 mg via ORAL
  Filled 2017-08-30: qty 1

## 2017-08-30 MED ORDER — SODIUM CHLORIDE 0.9 % IV SOLN: INTRAVENOUS | Status: AC

## 2017-08-30 MED ORDER — LISINOPRIL 10 MG PO TABS
10.0000 mg | ORAL_TABLET | Freq: Every day | ORAL | Status: DC
  Administered 2017-08-31: 10 mg via ORAL
  Filled 2017-08-30: qty 1

## 2017-08-30 MED ORDER — SODIUM CHLORIDE 0.9% FLUSH: 3.0000 mL | INTRAVENOUS | Status: DC | PRN

## 2017-08-30 MED ORDER — HEPARIN SODIUM (PORCINE) 5000 UNIT/ML IJ SOLN
5000.0000 [IU] | Freq: Three times a day (TID) | INTRAMUSCULAR | Status: DC
  Administered 2017-08-30 – 2017-08-31 (×2): 5000 [IU] via SUBCUTANEOUS
  Filled 2017-08-30 (×2): qty 1

## 2017-08-30 MED ORDER — MIDAZOLAM HCL 2 MG/2ML IJ SOLN
INTRAMUSCULAR | Status: DC | PRN
  Administered 2017-08-30: 1 mg via INTRAVENOUS

## 2017-08-30 MED ORDER — SODIUM CHLORIDE 0.9% FLUSH: 3.0000 mL | Freq: Two times a day (BID) | INTRAVENOUS | Status: DC

## 2017-08-30 MED ORDER — ASPIRIN 81 MG PO CHEW: 81.0000 mg | CHEWABLE_TABLET | ORAL | Status: DC

## 2017-08-30 MED ORDER — MIDAZOLAM HCL 2 MG/2ML IJ SOLN
INTRAMUSCULAR | Status: AC
  Filled 2017-08-30: qty 2

## 2017-08-30 MED ORDER — SODIUM CHLORIDE 0.9 % WEIGHT BASED INFUSION: 1.0000 mL/kg/h | INTRAVENOUS | Status: DC

## 2017-08-30 MED ORDER — FENTANYL CITRATE (PF) 100 MCG/2ML IJ SOLN
INTRAMUSCULAR | Status: AC
  Filled 2017-08-30: qty 2

## 2017-08-30 MED ORDER — SODIUM CHLORIDE 0.9 % IV SOLN: 250.0000 mL | INTRAVENOUS | Status: DC | PRN

## 2017-08-30 MED ORDER — SODIUM CHLORIDE 0.9% FLUSH
3.0000 mL | Freq: Two times a day (BID) | INTRAVENOUS | Status: DC
  Administered 2017-08-30 – 2017-08-31 (×2): 3 mL via INTRAVENOUS

## 2017-08-30 MED ORDER — IOPAMIDOL (ISOVUE-370) INJECTION 76%
INTRAVENOUS | Status: DC | PRN
  Administered 2017-08-30: 105 mL via INTRAVENOUS

## 2017-08-30 MED ORDER — VERAPAMIL HCL 2.5 MG/ML IV SOLN
INTRAVENOUS | Status: AC
  Filled 2017-08-30: qty 2

## 2017-08-30 MED ORDER — IPRATROPIUM BROMIDE 0.02 % IN SOLN
0.5000 mg | RESPIRATORY_TRACT | Status: DC
  Administered 2017-08-30 – 2017-08-31 (×2): 0.5 mg via RESPIRATORY_TRACT
  Filled 2017-08-30 (×2): qty 2.5

## 2017-08-30 MED ORDER — LIDOCAINE HCL (PF) 1 % IJ SOLN
INTRAMUSCULAR | Status: DC | PRN
  Administered 2017-08-30: 2 mL

## 2017-08-30 MED ORDER — HEPARIN SODIUM (PORCINE) 1000 UNIT/ML IJ SOLN
INTRAMUSCULAR | Status: AC
  Filled 2017-08-30: qty 1

## 2017-08-30 MED ORDER — FENTANYL CITRATE (PF) 100 MCG/2ML IJ SOLN
INTRAMUSCULAR | Status: DC | PRN
Start: 2017-08-30 — End: 2017-08-30
  Administered 2017-08-30: 50 ug via INTRAVENOUS

## 2017-08-30 SURGICAL SUPPLY — 12 items
CATH INFINITI 5 FR AL2 (CATHETERS) ×1 IMPLANT
CATH INFINITI 5 FR IM (CATHETERS) ×1 IMPLANT
CATH INFINITI 5FR JL5 (CATHETERS) ×1 IMPLANT
CATH INFINITI 5FR MULTPACK ANG (CATHETERS) ×1 IMPLANT
DEVICE RAD COMP TR BAND LRG (VASCULAR PRODUCTS) ×1 IMPLANT
GLIDESHEATH SLEND SS 6F .021 (SHEATH) ×1 IMPLANT
GUIDEWIRE INQWIRE 1.5J.035X260 (WIRE) IMPLANT
INQWIRE 1.5J .035X260CM (WIRE) ×2
KIT HEART LEFT (KITS) ×2 IMPLANT
PACK CARDIAC CATHETERIZATION (CUSTOM PROCEDURE TRAY) ×2 IMPLANT
TRANSDUCER W/STOPCOCK (MISCELLANEOUS) ×2 IMPLANT
TUBING CIL FLEX 10 FLL-RA (TUBING) ×2 IMPLANT

## 2017-08-30 NOTE — Brief Op Note (Signed)
BRIEF CARDIAC CATHETERIZATION NOTE  DATE: 08/30/2017 TIME: 4:54 PM  PATIENT:  Kerry Mason  61 y.o. male  PRE-OPERATIVE DIAGNOSIS:  Unstable angina  POST-OPERATIVE DIAGNOSIS:  Same  PROCEDURE:  Procedure(s): LEFT HEART CATH AND CORONARY ANGIOGRAPHY (N/A)  SURGEON:  Surgeon(s) and Role:    * Charliee Krenz, MD - Primary  FINDINGS: 1. Stable native coronary artery disease with 50% distal LMCA and chronic total occlusion of ostial RCA. Distal RCA fills via left-to-right collaterals. 2. Widely patent LIMA to LAD and SVG to ramus. 3. Multiple abnormal vessels arising from the LIMA graft. Mediastinal mass is a consideration. 4. Normal left ventricular filling pressure. 5. Moderate to severely reduced left ventricular contraction.  RECOMMENDATIONS: 1. Recommend CT chest to evaluate for intrathoracic mass. 2. Continue medical therapy and secondary prevention of CAD.  Nelva Bush, MD [facility] Pager: [phone]

## 2017-08-30 NOTE — Progress Notes (Signed)
Checked pt TR band-  Small amount of bleeding.  Re-inflated band 2 cc.  Monitored 1 min., still some more bleeding- added more 2 cc.

## 2017-08-30 NOTE — Progress Notes (Addendum)
Pt states unhappy with care and would like a new doctor.  MD paged.  Dr. Tamala Julian returned call- will consult with patient.

## 2017-08-30 NOTE — H&P (View-Only) (Signed)
Progress Note  Patient Name: Kerry Mason Date of Encounter: 08/30/2017  Primary Cardiologist: Dr. Martinique   Subjective   Still having shoulder blade pain and DOE.   Inpatient Medications    Scheduled Meds: . allopurinol  300 mg Oral Daily  . aspirin EC  81 mg Oral Daily  . atorvastatin  40 mg Oral q1800  . carvedilol  6.25 mg Oral BID WC  . heparin  5,000 Units Subcutaneous Q8H  . Influenza vac split quadrivalent PF  0.5 mL Intramuscular Tomorrow-1000  . isosorbide mononitrate  30 mg Oral Daily  . lisinopril  5 mg Oral Daily  . pneumococcal 23 valent vaccine  0.5 mL Intramuscular Tomorrow-1000  . spironolactone  25 mg Oral QHS   Continuous Infusions:  PRN Meds: acetaminophen, nitroGLYCERIN, ondansetron (ZOFRAN) IV   Vital Signs    Vitals:   08/29/17 2215 08/29/17 2327 08/30/17 0529 08/30/17 0753  BP: (!) 157/108 (!) 151/84 124/72 135/68  Pulse: 61 (!) 56 (!) 52 (!) 55  Resp: (!) 24 (!) 22 20 20   Temp:  98 F (36.7 C) 97.8 F (36.6 C) 98.1 F (36.7 C)  TempSrc:  Oral Oral Oral  SpO2: 93% 96% 98% 97%  Weight:  203 lb 4.2 oz (92.2 kg) 203 lb 9.6 oz (92.4 kg)   Height:  6\' 1"  (1.854 m)      Intake/Output Summary (Last 24 hours) at 08/30/17 0912 Last data filed at 08/30/17 0755  Gross per 24 hour  Intake                0 ml  Output                0 ml  Net                0 ml   Filed Weights   08/29/17 1423 08/29/17 2327 08/30/17 0529  Weight: 204 lb (92.5 kg) 203 lb 4.2 oz (92.2 kg) 203 lb 9.6 oz (92.4 kg)    Telemetry    Sinus bradycardia at rate of high 40s with rate PACs and PVCs- Personally Reviewed  ECG    Pending  EKG this morning - Personally Reviewed  Physical Exam   GEN: No acute distress.   Neck: No JVD Cardiac: RRR, no murmurs, rubs, or gallops.  Respiratory: Clear to auscultation bilaterally. GI: Soft, nontender, non-distended  MS: No edema; No deformity. Neuro:  Nonfocal  Psych: Normal affect   Labs    Chemistry Recent  Labs Lab 08/29/17 1424 08/29/17 2258 08/30/17 0424  NA 134*  --  136  K 4.4  --  4.1  CL 107  --  105  CO2 20*  --  22  GLUCOSE 98  --  74  BUN 14  --  17  CREATININE 0.93 1.10 1.00  CALCIUM 8.9  --  8.8*  GFRNONAA >60 >60 >60  GFRAA >60 >60 >60  ANIONGAP 7  --  9     Hematology Recent Labs Lab 08/29/17 1424 08/29/17 2258  WBC 6.6 8.1  RBC 5.15 5.45  HGB 16.3 17.0  HCT 46.8 49.5  MCV 90.9 90.8  MCH 31.7 31.2  MCHC 34.8 34.3  RDW 14.2 14.2  PLT 209 209    Cardiac Enzymes Recent Labs Lab 08/29/17 2258 08/30/17 0424  TROPONINI <0.03 <0.03    Recent Labs Lab 08/29/17 1438 08/29/17 1824  TROPIPOC 0.01 0.01       Radiology    Dg Chest 2  View  Result Date: 08/29/2017 CLINICAL DATA:  Shortness of breath with exertion EXAM: CHEST  2 VIEW COMPARISON:  10/04/2015 FINDINGS: Hyperinflation. Post sternotomy changes. Right-sided pacing device as before. Stable cardiomediastinal silhouette. No pneumothorax. IMPRESSION: No active cardiopulmonary disease. Electronically Signed   By: Donavan Foil M.D.   On: 08/29/2017 15:36    Cardiac Studies   None this admission  Patient Profile     61 y.o. male with PMH of CAD s/p multiple stents, and CABG, chronic systolic heart failure s/p ICD, hypertension, hyperlipidemia and tobacco use who presented to the ED with worsening fatigue, shortness of breath and back pain.  Assessment & Plan    1. CAD s/p CABG - Presenting with few days hx of DOE and significant discomfort between his shoulder blades. Troponin negative. EKG with known TWI.  - He has ruled out however pain is somewhat similar to prior angina.   - Recent travel to [place]. Will get D-dimer and another EKG. If reassuring discharge home with close follow up with Dr. Martinique. - Continue current therapy.   2. Chronic systolic CHF/ICM  s/p ICD  - CXR without acute cardiopulmomary disease. No orthopnea or PND. Continue current regimen.   3. PAF -  This was noted  with his pericarditis after developing cardiac tamponade from wire perforation. Device was recently interrogated earlier this year noting atrial tachycardia, but no PAF. - No arrhthymias noted on telemetry overnight. Given recent palpitation will interrogate device.   4. HLD - Continue statin  5. HTN - Elevated at time. Currently stable.  For questions or updates, please contact [place] Please consult [email or web address] for contact info under Cardiology/STEMI.     Signed, Leanor Kail, PA  08/30/2017, 9:12 AM    The patient was seen, examined and discussed with Bhagat,Bhavinkumar PA-C and I agree with the above.   61 y.o. male with PMH of CAD s/p multiple stents, and CABG, chronic systolic heart failure s/p ICD, hypertension, hyperlipidemia and tobacco use who presented to the ED with worsening fatigue, shortness of breath and back pain. The patient has ongoing chest pressure and pressure in his back what is typical for his angina. He has tried to walk the hallway and became profoundly short of breath and felt pressure in his back. His troponin so far are negative, his EKG shows sinus rhythm with anterior MI age undetermined right bundle branch block and negative T waves in the lateral leads that are unchanged from prior. His bradycardic and hypertensive, he says that he feels dizzy while he's walking. Since this is his typical anginal presentation I will schedule him for left cardiac catheterization. If this is normal I doubt this is a presentation off pulmonary embolism given his bradycardia, also his d-dimer was borderline elevated at 0.68 with normal being 0.5., he is also wheezing on physical exam I will start nebulizer treatment as he is an ongoing smoker. I have decreased his carvedilol to 3.125 by mouth twice a day as his heart rate gets as low as 40s, and increase his lisinopril to 10 mg daily. We will follow after the cardiac catheterization, I will start IV fluids since he  hasn't eaten anything today yet.   Ena Dawley, MD 08/30/2017

## 2017-08-30 NOTE — Interval H&P Note (Signed)
History and Physical Interval Note:  08/30/2017 3:54 PM  Kerry Mason  has presented today for cardiac catheterization, with the diagnosis of unstable angina. The various methods of treatment have been discussed with the patient and family. After consideration of risks, benefits and other options for treatment, the patient has consented to  Procedure(s): LEFT HEART CATH AND CORONARY ANGIOGRAPHY (N/A) as a surgical intervention .  The patient's history has been reviewed, patient examined, no change in status, stable for surgery.  I have reviewed the patient's chart and labs.  Questions were answered to the patient's satisfaction.     Cath Lab Visit (complete for each Cath Lab visit)  Clinical Evaluation Leading to the Procedure:   ACS: Yes.    Non-ACS:  N/A  [name]

## 2017-08-30 NOTE — Progress Notes (Signed)
Progress Note  Patient Name: Kerry Mason Date of Encounter: 08/30/2017  Primary Cardiologist: Dr. Martinique   Subjective   Still having shoulder blade pain and DOE.   Inpatient Medications    Scheduled Meds: . allopurinol  300 mg Oral Daily  . aspirin EC  81 mg Oral Daily  . atorvastatin  40 mg Oral q1800  . carvedilol  6.25 mg Oral BID WC  . heparin  5,000 Units Subcutaneous Q8H  . Influenza vac split quadrivalent PF  0.5 mL Intramuscular Tomorrow-1000  . isosorbide mononitrate  30 mg Oral Daily  . lisinopril  5 mg Oral Daily  . pneumococcal 23 valent vaccine  0.5 mL Intramuscular Tomorrow-1000  . spironolactone  25 mg Oral QHS   Continuous Infusions:  PRN Meds: acetaminophen, nitroGLYCERIN, ondansetron (ZOFRAN) IV   Vital Signs    Vitals:   08/29/17 2215 08/29/17 2327 08/30/17 0529 08/30/17 0753  BP: (!) 157/108 (!) 151/84 124/72 135/68  Pulse: 61 (!) 56 (!) 52 (!) 55  Resp: (!) 24 (!) 22 20 20   Temp:  98 F (36.7 C) 97.8 F (36.6 C) 98.1 F (36.7 C)  TempSrc:  Oral Oral Oral  SpO2: 93% 96% 98% 97%  Weight:  203 lb 4.2 oz (92.2 kg) 203 lb 9.6 oz (92.4 kg)   Height:  6\' 1"  (1.854 m)      Intake/Output Summary (Last 24 hours) at 08/30/17 0912 Last data filed at 08/30/17 0755  Gross per 24 hour  Intake                0 ml  Output                0 ml  Net                0 ml   Filed Weights   08/29/17 1423 08/29/17 2327 08/30/17 0529  Weight: 204 lb (92.5 kg) 203 lb 4.2 oz (92.2 kg) 203 lb 9.6 oz (92.4 kg)    Telemetry    Sinus bradycardia at rate of high 40s with rate PACs and PVCs- Personally Reviewed  ECG    Pending  EKG this morning - Personally Reviewed  Physical Exam   GEN: No acute distress.   Neck: No JVD Cardiac: RRR, no murmurs, rubs, or gallops.  Respiratory: Clear to auscultation bilaterally. GI: Soft, nontender, non-distended  MS: No edema; No deformity. Neuro:  Nonfocal  Psych: Normal affect   Labs    Chemistry Recent  Labs Lab 08/29/17 1424 08/29/17 2258 08/30/17 0424  NA 134*  --  136  K 4.4  --  4.1  CL 107  --  105  CO2 20*  --  22  GLUCOSE 98  --  74  BUN 14  --  17  CREATININE 0.93 1.10 1.00  CALCIUM 8.9  --  8.8*  GFRNONAA >60 >60 >60  GFRAA >60 >60 >60  ANIONGAP 7  --  9     Hematology Recent Labs Lab 08/29/17 1424 08/29/17 2258  WBC 6.6 8.1  RBC 5.15 5.45  HGB 16.3 17.0  HCT 46.8 49.5  MCV 90.9 90.8  MCH 31.7 31.2  MCHC 34.8 34.3  RDW 14.2 14.2  PLT 209 209    Cardiac Enzymes Recent Labs Lab 08/29/17 2258 08/30/17 0424  TROPONINI <0.03 <0.03    Recent Labs Lab 08/29/17 1438 08/29/17 1824  TROPIPOC 0.01 0.01       Radiology    Dg Chest 2  View  Result Date: 08/29/2017 CLINICAL DATA:  Shortness of breath with exertion EXAM: CHEST  2 VIEW COMPARISON:  10/04/2015 FINDINGS: Hyperinflation. Post sternotomy changes. Right-sided pacing device as before. Stable cardiomediastinal silhouette. No pneumothorax. IMPRESSION: No active cardiopulmonary disease. Electronically Signed   By: Donavan Foil M.D.   On: 08/29/2017 15:36    Cardiac Studies   None this admission  Patient Profile     61 y.o. male with PMH of CAD s/p multiple stents, and CABG, chronic systolic heart failure s/p ICD, hypertension, hyperlipidemia and tobacco use who presented to the ED with worsening fatigue, shortness of breath and back pain.  Assessment & Plan    1. CAD s/p CABG - Presenting with few days hx of DOE and significant discomfort between his shoulder blades. Troponin negative. EKG with known TWI.  - He has ruled out however pain is somewhat similar to prior angina.   - Recent travel to [place]. Will get D-dimer and another EKG. If reassuring discharge home with close follow up with Dr. Martinique. - Continue current therapy.   2. Chronic systolic CHF/ICM  s/p ICD  - CXR without acute cardiopulmomary disease. No orthopnea or PND. Continue current regimen.   3. PAF -  This was noted  with his pericarditis after developing cardiac tamponade from wire perforation. Device was recently interrogated earlier this year noting atrial tachycardia, but no PAF. - No arrhthymias noted on telemetry overnight. Given recent palpitation will interrogate device.   4. HLD - Continue statin  5. HTN - Elevated at time. Currently stable.  For questions or updates, please contact [place] Please consult [email or web address] for contact info under Cardiology/STEMI.     Signed, Leanor Kail, PA  08/30/2017, 9:12 AM    The patient was seen, examined and discussed with Bhagat,Bhavinkumar PA-C and I agree with the above.   62 y.o. male with PMH of CAD s/p multiple stents, and CABG, chronic systolic heart failure s/p ICD, hypertension, hyperlipidemia and tobacco use who presented to the ED with worsening fatigue, shortness of breath and back pain. The patient has ongoing chest pressure and pressure in his back what is typical for his angina. He has tried to walk the hallway and became profoundly short of breath and felt pressure in his back. His troponin so far are negative, his EKG shows sinus rhythm with anterior MI age undetermined right bundle branch block and negative T waves in the lateral leads that are unchanged from prior. His bradycardic and hypertensive, he says that he feels dizzy while he's walking. Since this is his typical anginal presentation I will schedule him for left cardiac catheterization. If this is normal I doubt this is a presentation off pulmonary embolism given his bradycardia, also his d-dimer was borderline elevated at 0.68 with normal being 0.5., he is also wheezing on physical exam I will start nebulizer treatment as he is an ongoing smoker. I have decreased his carvedilol to 3.125 by mouth twice a day as his heart rate gets as low as 40s, and increase his lisinopril to 10 mg daily. We will follow after the cardiac catheterization, I will start IV fluids since he  hasn't eaten anything today yet.   Ena Dawley, MD 08/30/2017

## 2017-08-31 ENCOUNTER — Inpatient Hospital Stay (HOSPITAL_COMMUNITY): Payer: Medicaid Other

## 2017-08-31 ENCOUNTER — Encounter (HOSPITAL_COMMUNITY): Payer: Self-pay | Admitting: Internal Medicine

## 2017-08-31 DIAGNOSIS — I255 Ischemic cardiomyopathy: Secondary | ICD-10-CM

## 2017-08-31 DIAGNOSIS — I25119 Atherosclerotic heart disease of native coronary artery with unspecified angina pectoris: Secondary | ICD-10-CM

## 2017-08-31 DIAGNOSIS — J449 Chronic obstructive pulmonary disease, unspecified: Secondary | ICD-10-CM | POA: Diagnosis present

## 2017-08-31 DIAGNOSIS — R0602 Shortness of breath: Secondary | ICD-10-CM

## 2017-08-31 DIAGNOSIS — I712 Thoracic aortic aneurysm, without rupture: Secondary | ICD-10-CM

## 2017-08-31 DIAGNOSIS — M549 Dorsalgia, unspecified: Secondary | ICD-10-CM

## 2017-08-31 DIAGNOSIS — I7121 Aneurysm of the ascending aorta, without rupture: Secondary | ICD-10-CM

## 2017-08-31 DIAGNOSIS — I714 Abdominal aortic aneurysm, without rupture, unspecified: Secondary | ICD-10-CM | POA: Diagnosis present

## 2017-08-31 DIAGNOSIS — R072 Precordial pain: Secondary | ICD-10-CM

## 2017-08-31 HISTORY — DX: Chronic obstructive pulmonary disease, unspecified: J44.9

## 2017-08-31 HISTORY — DX: Aneurysm of the ascending aorta, without rupture: I71.21

## 2017-08-31 HISTORY — DX: Dorsalgia, unspecified: M54.9

## 2017-08-31 HISTORY — DX: Thoracic aortic aneurysm, without rupture: I71.2

## 2017-08-31 MED ORDER — IOPAMIDOL (ISOVUE-300) INJECTION 61%
INTRAVENOUS | Status: AC
  Administered 2017-08-31: 75 mL
  Filled 2017-08-31: qty 75

## 2017-08-31 MED ORDER — CARVEDILOL 3.125 MG PO TABS: 3.1250 mg | ORAL_TABLET | Freq: Two times a day (BID) | ORAL | 6 refills | Status: DC

## 2017-08-31 MED ORDER — MORPHINE SULFATE (PF) 2 MG/ML IV SOLN: 2.0000 mg | INTRAVENOUS | Status: DC | PRN

## 2017-08-31 MED ORDER — CYCLOBENZAPRINE HCL 5 MG PO TABS
5.0000 mg | ORAL_TABLET | Freq: Three times a day (TID) | ORAL | Status: DC | PRN
Start: 2017-08-31 — End: 2017-08-31

## 2017-08-31 MED ORDER — TIOTROPIUM BROMIDE MONOHYDRATE 18 MCG IN CAPS: 18.0000 ug | ORAL_CAPSULE | Freq: Every day | RESPIRATORY_TRACT | 12 refills | Status: DC

## 2017-08-31 MED ORDER — TIOTROPIUM BROMIDE MONOHYDRATE 18 MCG IN CAPS
18.0000 ug | ORAL_CAPSULE | Freq: Every day | RESPIRATORY_TRACT | Status: DC
  Filled 2017-08-31: qty 5

## 2017-08-31 MED ORDER — IPRATROPIUM BROMIDE 0.02 % IN SOLN
0.5000 mg | Freq: Two times a day (BID) | RESPIRATORY_TRACT | Status: DC
  Administered 2017-08-31: 0.5 mg via RESPIRATORY_TRACT
  Filled 2017-08-31: qty 2.5

## 2017-08-31 MED ORDER — LISINOPRIL 10 MG PO TABS: 10.0000 mg | ORAL_TABLET | Freq: Every day | ORAL | 6 refills | Status: DC

## 2017-08-31 MED ORDER — CYCLOBENZAPRINE HCL 5 MG PO TABS: 5.0000 mg | ORAL_TABLET | Freq: Three times a day (TID) | ORAL | 0 refills | Status: DC | PRN

## 2017-08-31 NOTE — Discharge Summary (Signed)
Discharge Summary    Patient ID: Kerry Mason,  MRN: [id number], DOB/AGE: Apr 02, 1956 61 y.o.  Admit date: 08/29/2017 Discharge date: 08/31/2017  Primary Care Provider: Velna Hatchet Primary Cardiologist: Dr. P. Martinique   Discharge Diagnoses    Active Problems:   Chronic systolic CHF (congestive heart failure), NYHA class 3 ([place])   Hyperlipidemia   CAD S/P multiple PCI- last March 3220 to RCA complicated by tamponade   PAF- Amiodarone stopped, holding NSR   Chest pain   Coronary artery disease involving native coronary artery of native heart with angina pectoris (HCC)   Back pain   Ascending aortic aneurysm (HCC)   COPD (chronic obstructive pulmonary disease) (HCC)    Allergies No Known Allergies  Diagnostic Studies/Procedures    08/30/17 LEFT HEART CATH AND CORS/GRAFTS ANGIOGRAPHY  Conclusion   Conclusions: 1. Stable native coronary artery disease, including 50% distal LMCA and 70% small D1 stenoses, as well as chronic total occlusion of the RCA. The distal RCA fills via left-to-right collaterals. 2. Widely patent LIMA to LAD and SVG to ramus. 3. Tangle of arteries supplying indeterminate structure in the mediastinum. A mass is a consideration. 4. Normal to low left ventricular filling pressure. 5. Moderate to severely reduced left ventricular contraction.  Recommendations: 1. Continue medical therapy and secondary prevention of coronary artery disease. 2. Recommend CT of the chest to evaluate for mediastinal mass, given tangle of abnormal arteries arising from the LIMA and patient's atypical symptoms.     _____________   History of Present Illness     61 yo male with PMH of CAD s/p multiple stents, and CABG, chronic systolic heart failure s/p ICD, hypertension, hyperlipidemia and tobacco use who presented to the ED with worsening fatigue, shortness of breath and back pain 08/30/17. Please see H&P for complete past caths.  Reported over the past several  weeks he had noticed he had not been at his baseline with physical activity. Tends to become more fatigued and has experienced some dyspnea on exertion. No significant chest pain, but yesterday reported an "uncomfortable feeling" in his back between his shoulder blades. Also has noted increased episodes of palpitations specifically in the evening over the past couple of days. States that he feels this is similar to that episode of PAF he had in the past.    EKG with SR with no acute changes.  Pt admitted with plan for cardiac cath.    Hospital Course     Consultants: none   Cardiac enzymes were negative <0.03 and DDimer was elevated.  He underwent cardiac cath and EF at 35%, stable native CAD and patent LIMA to LAD and VG to ramus. Chest CTA was ordered and no PE, no mass.  He does have 4.1 ascending aortic aneurysm no disection that will need follow up yearly.  He also has COPD and will need follow up with pulmonary.  I will ask this to be done on his follow up visit. We started Spiriva here.  He does have back pain and will give flexeril prn with recommendation if continues then he should see PCP.     This morning continues to complain of back pain and flexeril and morphine given.    _____________  Discharge Vitals Blood pressure 130/68, pulse 66, temperature 98 F (36.7 C), temperature source Oral, resp. rate 18, height 6\' 1"  (1.854 m), weight 202 lb 11.2 oz (91.9 kg), SpO2 96 %.  Filed Weights   08/29/17 2327 08/30/17 0529 08/31/17 2542  Weight: 203 lb 4.2 oz (92.2 kg) 203 lb 9.6 oz (92.4 kg) 202 lb 11.2 oz (91.9 kg)    Labs & Radiologic Studies    CBC  Recent Labs  08/29/17 1424 08/29/17 2258  WBC 6.6 8.1  HGB 16.3 17.0  HCT 46.8 49.5  MCV 90.9 90.8  PLT 209 914   Basic Metabolic Panel  Recent Labs  08/29/17 1424 08/29/17 2258 08/30/17 0424  NA 134*  --  136  K 4.4  --  4.1  CL 107  --  105  CO2 20*  --  22  GLUCOSE 98  --  74  BUN 14  --  17  CREATININE 0.93  1.10 1.00  CALCIUM 8.9  --  8.8*   Liver Function Tests No results for input(s): AST, ALT, ALKPHOS, BILITOT, PROT, ALBUMIN in the last 72 hours. No results for input(s): LIPASE, AMYLASE in the last 72 hours. Cardiac Enzymes  Recent Labs  08/29/17 2258 08/30/17 0424 08/30/17 0940  TROPONINI <0.03 <0.03 <0.03   BNP Invalid input(s): POCBNP D-Dimer  Recent Labs  08/30/17 0940  DDIMER 0.68*   Hemoglobin A1C No results for input(s): HGBA1C in the last 72 hours. Fasting Lipid Panel No results for input(s): CHOL, HDL, LDLCALC, TRIG, CHOLHDL, LDLDIRECT in the last 72 hours. Thyroid Function Tests No results for input(s): TSH, T4TOTAL, T3FREE, THYROIDAB in the last 72 hours.  Invalid input(s): FREET3 _____________  Dg Chest 2 View  Result Date: 08/29/2017 CLINICAL DATA:  Shortness of breath with exertion EXAM: CHEST  2 VIEW COMPARISON:  10/04/2015 FINDINGS: Hyperinflation. Post sternotomy changes. Right-sided pacing device as before. Stable cardiomediastinal silhouette. No pneumothorax. IMPRESSION: No active cardiopulmonary disease. Electronically Signed   By: Donavan Foil M.D.   On: 08/29/2017 15:36   Ct Chest W Contrast  Result Date: 08/31/2017 CLINICAL DATA:  61 y/o M; acute respiratory illness. Mediastinal mass, initial workup. EXAM: CT CHEST WITH CONTRAST TECHNIQUE: Multidetector CT imaging of the chest was performed during intravenous contrast administration. CONTRAST:  63mL ISOVUE-300 IOPAMIDOL (ISOVUE-300) INJECTION 61% COMPARISON:  08/29/2017 chest radiograph. FINDINGS: Cardiovascular: Normal heart size. Severe coronary artery calcification. Status post coronary artery bypass surgery with LIMA and saphenous graft. No pericardial effusion. 4.1 cm ascending aortic aneurysm and mild calcific atherosclerosis of thoracic aorta. 3.2 cm main pulmonary artery. Mediastinum/Nodes: Normal thyroid gland. Normal thoracic esophagus. Left lower paratracheal lymphadenopathy measuring 23 x 15  mm (series 3, image 67). Lungs/Pleura: Scattered 2-3 mm pulmonary nodules: Right upper lobe (series 4: Image 26), right upper lobe (4:49), left upper lobe (4:98), right upper lobe (4:60). Moderate to severe centrilobular emphysema with upper lobe predominance. Upper Abdomen: Bilateral renal cysts. Musculoskeletal: Healed median sternotomy. No acute bony abnormality. IMPRESSION: 1. Moderate to severe upper lobe centrilobular emphysema. 2. 4.1 cm ascending aortic aneurysm. Recommend annual imaging followup by CTA or MRA. This recommendation follows 2010 ACCF/AHA/AATS/ACR/ASA/SCA/SCAI/SIR/STS/SVM Guidelines for the Diagnosis and Management of Patients with Thoracic Aortic Disease. Circulation. 2010; 121: N829-F621. 3. Left lower paratracheal lymphadenopathy, probably reactive. 4. Several scattered 2-3 mm pulmonary nodules. No follow-up needed if patient is low-risk (and has no known or suspected primary neoplasm). Non-contrast chest CT can be considered in 12 months if patient is high-risk. This recommendation follows the consensus statement: Guidelines for Management of Incidental Pulmonary Nodules Detected on CT Images: From the Fleischner Society 2017; Radiology 2017; 284:228-243. 5. Severe coronary artery calcification. Healed median sternotomy. Status post CABG. Electronically Signed   By: Kristine Garbe M.D.   On:  08/31/2017 04:26   Disposition   Pt is being discharged home today in good condition.  Follow-up Plans & Appointments   Call [facility] at [phone] if any bleeding, swelling or drainage at cath site.  May shower, no tub baths for 48 hours for groin sticks. No lifting over 5 pounds for 3 days.  No Driving for 3 days  We will arrange for you to see pulmonologist at next visit.    Heart Healthy diet.     Follow-up Information    Martinique, Peter M, MD Follow up on 09/14/2017.   Specialty:  Cardiology Why:  with Almyra Deforest, PA for Dr. Martinique Contact  information: [address] [place] [address] [phone]            Discharge Medications   Current Discharge Medication List    START taking these medications   Details  cyclobenzaprine (FLEXERIL) 5 MG tablet Take 1 tablet (5 mg total) by mouth 3 (three) times daily as needed for muscle spasms (of back). Qty: 30 tablet, Refills: 0    tiotropium (SPIRIVA) 18 MCG inhalation capsule Place 1 capsule (18 mcg total) into inhaler and inhale daily. Qty: 30 capsule, Refills: 12      CONTINUE these medications which have CHANGED   Details  carvedilol (COREG) 3.125 MG tablet Take 1 tablet (3.125 mg total) by mouth 2 (two) times daily with a meal. Qty: 60 tablet, Refills: 6    lisinopril (PRINIVIL,ZESTRIL) 10 MG tablet Take 1 tablet (10 mg total) by mouth daily. Qty: 30 tablet, Refills: 6      CONTINUE these medications which have NOT CHANGED   Details  allopurinol (ZYLOPRIM) 300 MG tablet TAKE ONE TABLET BY MOUTH ONCE DAILY Qty: 90 tablet, Refills: 3    aspirin EC 81 MG tablet Take 81 mg by mouth daily.    atorvastatin (LIPITOR) 40 MG tablet TAKE ONE TABLET BY MOUTH ONCE DAILY AT 6 PM Qty: 30 tablet, Refills: 10    furosemide (LASIX) 40 MG tablet TAKE ONE TABLET BY MOUTH ONCE DAILY Qty: 30 tablet, Refills: 1    isosorbide mononitrate (IMDUR) 30 MG 24 hr tablet TAKE ONE TABLET BY MOUTH ONCE DAILY Qty: 30 tablet, Refills: 5    NITROSTAT 0.4 MG SL tablet DISSOLVE ONE TABLET UNDER THE TONGUE EVERY 5 MINUTES AS NEEDED FOR CHEST PAIN. Qty: 25 tablet, Refills: 4    Omega-3 Fatty Acids (FISH OIL PO) Take 1 capsule by mouth daily.    spironolactone (ALDACTONE) 25 MG tablet TAKE ONE TABLET BY MOUTH AT BEDTIME Qty: 30 tablet, Refills: 8    acetaminophen (TYLENOL) 325 MG tablet Take 2 tablets (650 mg total) by mouth every 4 (four) hours as needed for headache or mild pain.      STOP taking these medications     amoxicillin-clavulanate (AUGMENTIN) 875-125 MG  tablet      meloxicam (MOBIC) 7.5 MG tablet            Outstanding Labs/Studies   Pulmonary consult.   Duration of Discharge Encounter   Greater than 30 minutes including physician time.  Signed, Cecilie Kicks NP 08/31/2017, 4:28 PM

## 2017-08-31 NOTE — Progress Notes (Signed)
Patient reports tylenol is not effective for pain.  Request something else.  MD- notified.  Returned call- will place order for patient.

## 2017-08-31 NOTE — Plan of Care (Signed)
Problem: Activity: Goal: Risk for activity intolerance will decrease Outcome: Completed/Met Date Met: 08/31/17 Encouraged continued ambulation.  Provided education regarding risk of immobility and prolonged patient recovery.   Problem: Bowel/Gastric: Goal: Will not experience complications related to bowel motility Outcome: Completed/Met Date Met: 08/31/17 Encourage continued ambulation as tolerated.  Educated patient on importance of ambulation and effects on GI.      

## 2017-08-31 NOTE — Progress Notes (Signed)
Progress Note  Patient Name: Arzell Mcgeehan Date of Encounter: 08/31/2017  Primary Cardiologist: Dr. Martinique   Subjective   The patient continues to feel SOB. No chest pain.  Inpatient Medications    Scheduled Meds: . allopurinol  300 mg Oral Daily  . aspirin EC  81 mg Oral Daily  . atorvastatin  40 mg Oral q1800  . carvedilol  3.125 mg Oral BID WC  . heparin  5,000 Units Subcutaneous Q8H  . ipratropium  0.5 mg Nebulization BID  . isosorbide mononitrate  30 mg Oral Daily  . lisinopril  10 mg Oral Daily  . sodium chloride flush  3 mL Intravenous Q12H  . spironolactone  25 mg Oral QHS   Continuous Infusions: . sodium chloride     PRN Meds: sodium chloride, acetaminophen, ALPRAZolam, cyclobenzaprine, morphine injection, nitroGLYCERIN, ondansetron (ZOFRAN) IV, sodium chloride flush   Vital Signs    Vitals:   08/31/17 0800 08/31/17 0851 08/31/17 1037 08/31/17 1220  BP: (!) 150/99  (!) 150/100 130/68  Pulse: 75  72 66  Resp:    18  Temp:    98 F (36.7 C)  TempSrc:    Oral  SpO2: 98% 95% 96% 96%  Weight:      Height:        Intake/Output Summary (Last 24 hours) at 08/31/17 1249 Last data filed at 08/31/17 0857  Gross per 24 hour  Intake           656.25 ml  Output              800 ml  Net          -143.75 ml   Filed Weights   08/29/17 2327 08/30/17 0529 08/31/17 0520  Weight: 203 lb 4.2 oz (92.2 kg) 203 lb 9.6 oz (92.4 kg) 202 lb 11.2 oz (91.9 kg)   Telemetry    Sinus bradycardia at rate of high 40s with rate PACs and PVCs- Personally Reviewed  ECG    Pending  EKG this morning - Personally Reviewed  Physical Exam   GEN: No acute distress.   Neck: No JVD Cardiac: RRR, no murmurs, rubs, or gallops.  Respiratory: Clear to auscultation bilaterally. GI: Soft, nontender, non-distended  MS: No edema; No deformity. Neuro:  Nonfocal  Psych: Normal affect   Labs    Chemistry  Recent Labs Lab 08/29/17 1424 08/29/17 2258 08/30/17 0424  NA 134*  --   136  K 4.4  --  4.1  CL 107  --  105  CO2 20*  --  22  GLUCOSE 98  --  74  BUN 14  --  17  CREATININE 0.93 1.10 1.00  CALCIUM 8.9  --  8.8*  GFRNONAA >60 >60 >60  GFRAA >60 >60 >60  ANIONGAP 7  --  9    Hematology  Recent Labs Lab 08/29/17 1424 08/29/17 2258  WBC 6.6 8.1  RBC 5.15 5.45  HGB 16.3 17.0  HCT 46.8 49.5  MCV 90.9 90.8  MCH 31.7 31.2  MCHC 34.8 34.3  RDW 14.2 14.2  PLT 209 209   Cardiac Enzymes  Recent Labs Lab 08/29/17 2258 08/30/17 0424 08/30/17 0940  TROPONINI <0.03 <0.03 <0.03     Recent Labs Lab 08/29/17 1438 08/29/17 1824  TROPIPOC 0.01 0.01    Radiology    Dg Chest 2 View  Result Date: 08/29/2017 CLINICAL DATA:  Shortness of breath with exertion EXAM: CHEST  2 VIEW COMPARISON:  10/04/2015 FINDINGS: Hyperinflation.  Post sternotomy changes. Right-sided pacing device as before. Stable cardiomediastinal silhouette. No pneumothorax. IMPRESSION: No active cardiopulmonary disease. Electronically Signed   By: Donavan Foil M.D.   On: 08/29/2017 15:36   Ct Chest W Contrast  Result Date: 08/31/2017 CLINICAL DATA:  61 y/o M; acute respiratory illness. Mediastinal mass, initial workup. EXAM: CT CHEST WITH CONTRAST TECHNIQUE: Multidetector CT imaging of the chest was performed during intravenous contrast administration. CONTRAST:  57mL ISOVUE-300 IOPAMIDOL (ISOVUE-300) INJECTION 61% COMPARISON:  08/29/2017 chest radiograph. FINDINGS: Cardiovascular: Normal heart size. Severe coronary artery calcification. Status post coronary artery bypass surgery with LIMA and saphenous graft. No pericardial effusion. 4.1 cm ascending aortic aneurysm and mild calcific atherosclerosis of thoracic aorta. 3.2 cm main pulmonary artery. Mediastinum/Nodes: Normal thyroid gland. Normal thoracic esophagus. Left lower paratracheal lymphadenopathy measuring 23 x 15 mm (series 3, image 67). Lungs/Pleura: Scattered 2-3 mm pulmonary nodules: Right upper lobe (series 4: Image 26), right  upper lobe (4:49), left upper lobe (4:98), right upper lobe (4:60). Moderate to severe centrilobular emphysema with upper lobe predominance. Upper Abdomen: Bilateral renal cysts. Musculoskeletal: Healed median sternotomy. No acute bony abnormality. IMPRESSION: 1. Moderate to severe upper lobe centrilobular emphysema. 2. 4.1 cm ascending aortic aneurysm. Recommend annual imaging followup by CTA or MRA. This recommendation follows 2010 ACCF/AHA/AATS/ACR/ASA/SCA/SCAI/SIR/STS/SVM Guidelines for the Diagnosis and Management of Patients with Thoracic Aortic Disease. Circulation. 2010; 121: K270-W237. 3. Left lower paratracheal lymphadenopathy, probably reactive. 4. Several scattered 2-3 mm pulmonary nodules. No follow-up needed if patient is low-risk (and has no known or suspected primary neoplasm). Non-contrast chest CT can be considered in 12 months if patient is high-risk. This recommendation follows the consensus statement: Guidelines for Management of Incidental Pulmonary Nodules Detected on CT Images: From the Fleischner Society 2017; Radiology 2017; 284:228-243. 5. Severe coronary artery calcification. Healed median sternotomy. Status post CABG. Electronically Signed   By: Kristine Garbe M.D.   On: 08/31/2017 04:26    Cardiac Studies   Chest CT with and without contrast 08/30/17  1. Moderate to severe upper lobe centrilobular emphysema. 2. 4.1 cm ascending aortic aneurysm. Recommend annual imaging followup by CTA or MRA. This recommendation follows 2010 ACCF/AHA/AATS/ACR/ASA/SCA/SCAI/SIR/STS/SVM Guidelines for the Diagnosis and Management of Patients with Thoracic Aortic Disease. Circulation. 2010; 121: S283-T517. 3. Left lower paratracheal lymphadenopathy, probably reactive. 4. Several scattered 2-3 mm pulmonary nodules. No follow-up needed if patient is low-risk (and has no known or suspected primary neoplasm). Non-contrast chest CT can be considered in 12 months if patient is high-risk.  This recommendation follows the consensus statement: Guidelines for Management of Incidental Pulmonary Nodules Detected on CT Images: From the Fleischner Society 2017; Radiology 2017; 284:228-243. 5. Severe coronary artery calcification. Healed median sternotomy. Status post CABG.  Cardiac catheterization: 08/30/2017  1. Stable native coronary artery disease, including 50% distal LMCA and 70% small D1 stenoses, as well as chronic total occlusion of the RCA. The distal RCA fills via left-to-right collaterals. 2. Widely patent LIMA to LAD and SVG to ramus. 3. Tangle of arteries supplying indeterminate structure in the mediastinum. A mass is a consideration. 4. Normal to low left ventricular filling pressure. 5. Moderate to severely reduced left ventricular contraction.  Recommendations: 1. Continue medical therapy and secondary prevention of coronary artery disease. 2. Recommend CT of the chest to evaluate for mediastinal mass, given tangle of abnormal arteries arising from the LIMA and patient's atypical symptoms.    Patient Profile     61 y.o. male with PMH of CAD s/p multiple stents,  and CABG, chronic systolic heart failure s/p ICD, hypertension, hyperlipidemia and tobacco use who presented to the ED with worsening fatigue, shortness of breath and back pain.  Assessment & Plan    1. CAD s/p CABG 2. Chronic systolic CHF/ICM  s/p ICD  3. PAF 4. HLD 5. HTN  61 y.o. male with PMH of CAD s/p multiple stents, and CABG, chronic systolic heart failure s/p ICD, hypertension, hyperlipidemia and tobacco use who presented to the ED with worsening fatigue, shortness of breath and back pain. The patient has ongoing chest pressure and pressure in his back what is typical for his angina. He has tried to walk the hallway and became profoundly short of breath and felt pressure in his back. His troponin so far are negative, his EKG shows sinus rhythm with anterior MI age undetermined right bundle  branch block and negative T waves in the lateral leads that are unchanged from prior. His bradycardic and hypertensive, he says that he feels dizzy while he's walking.  His cath showed stable native coronary artery disease, including 50% distal LMCA and 70% small D1 stenoses, as well as chronic total occlusion of the RCA. The distal RCA fills via left-to-right collaterals. Widely patent LIMA to LAD and SVG to ramus. There was a suspicion for a tangle of arteries supplying indeterminate structure in the mediastinum. A mass is a consideration.  A chest CT with and without contrast showed moderate to severe upper lobe centrilobular emphysema. 4.1 cm ascending aortic aneurysm. Left lower paratracheal lymphadenopathy, probably reactive.  Several scattered 2-3 mm pulmonary nodules.   Plan:  - discharge home, arrange follow up with Dr [name] as he states that his PVCs have been increasing in frequency and they are very uncomfortable for him - refer to pulmonary for further COPD evaluation, he is on no therapy, I will start Spiriva 18 mcg daily  Ena Dawley, MD 08/31/2017

## 2017-08-31 NOTE — Discharge Instructions (Signed)
Call [facility] at [phone] if any bleeding, swelling or drainage at cath site.  May shower, no tub baths for 48 hours for groin sticks. No lifting over 5 pounds for 3 days.  No Driving for 3 days  We will arrange for you to see pulmonologist at next visit.    Heart Healthy diet.

## 2017-09-03 ENCOUNTER — Other Ambulatory Visit: Payer: Self-pay

## 2017-09-03 DIAGNOSIS — J449 Chronic obstructive pulmonary disease, unspecified: Secondary | ICD-10-CM

## 2017-09-05 ENCOUNTER — Ambulatory Visit (INDEPENDENT_AMBULATORY_CARE_PROVIDER_SITE_OTHER): Payer: Medicaid Other | Admitting: Pulmonary Disease

## 2017-09-05 ENCOUNTER — Other Ambulatory Visit: Payer: Medicaid Other

## 2017-09-05 ENCOUNTER — Encounter: Payer: Self-pay | Admitting: Pulmonary Disease

## 2017-09-05 VITALS — BP 128/74 | HR 72 | Ht 73.0 in | Wt 208.7 lb

## 2017-09-05 DIAGNOSIS — R918 Other nonspecific abnormal finding of lung field: Secondary | ICD-10-CM

## 2017-09-05 DIAGNOSIS — J449 Chronic obstructive pulmonary disease, unspecified: Secondary | ICD-10-CM

## 2017-09-05 DIAGNOSIS — Z720 Tobacco use: Secondary | ICD-10-CM | POA: Diagnosis not present

## 2017-09-05 MED ORDER — VARENICLINE TARTRATE 1 MG PO TABS: 1.0000 mg | ORAL_TABLET | Freq: Two times a day (BID) | ORAL | 2 refills | Status: DC

## 2017-09-05 MED ORDER — BUDESONIDE-FORMOTEROL FUMARATE 160-4.5 MCG/ACT IN AERO: 2.0000 | INHALATION_SPRAY | Freq: Two times a day (BID) | RESPIRATORY_TRACT | 0 refills | Status: DC

## 2017-09-05 MED ORDER — BUDESONIDE-FORMOTEROL FUMARATE 160-4.5 MCG/ACT IN AERO: 2.0000 | INHALATION_SPRAY | Freq: Two times a day (BID) | RESPIRATORY_TRACT | 6 refills | Status: DC

## 2017-09-05 NOTE — Progress Notes (Signed)
   Subjective:    Patient ID: Kerry Mason, male    DOB: Dec 15, 1955, 61 y.o.   MRN: [id number]  HPI    Review of Systems  Constitutional: Negative for fever and unexpected weight change.  HENT: Positive for congestion. Negative for dental problem, nosebleeds, postnasal drip, rhinorrhea, sinus pressure, sneezing, sore throat and trouble swallowing.   Eyes: Negative for redness and itching.  Respiratory: Positive for cough, chest tightness, shortness of breath and wheezing.   Cardiovascular: Positive for palpitations. Negative for leg swelling.  Gastrointestinal: Negative for nausea and vomiting.  Genitourinary: Negative for dysuria.  Musculoskeletal: Negative for joint swelling.  Skin: Negative for rash.  Allergic/Immunologic: Negative.  Negative for environmental allergies, food allergies and immunocompromised state.  Neurological: Negative for headaches.  Hematological: Does not bruise/bleed easily.  Psychiatric/Behavioral: Negative for dysphoric mood. The patient is not nervous/anxious.        Objective:   Physical Exam        Assessment & Plan:

## 2017-09-05 NOTE — Progress Notes (Signed)
Patient seen in the office today and instructed on use of symbicort 160.  Patient expressed understanding and demonstrated technique.  

## 2017-09-05 NOTE — Progress Notes (Signed)
Past surgical history He  has a past surgical history that includes Cardiac catheterization; Coronary angioplasty (2000); Coronary angioplasty with stent (2015); left heart catheterization with coronary angiogram (N/A, 12/11/2013); percutaneous coronary stent intervention (pci-s) (12/11/2013); implantable cardioverter defibrillator implant (N/A, 04/17/2014); left heart catheterization with coronary angiogram (N/A, 01/05/2015); percutaneous coronary stent intervention (pci-s) (N/A, 02/03/2015); Cardiac catheterization (N/A, 06/14/2015); Cardiac catheterization (N/A, 08/18/2015); Foot surgery; Colonoscopy; Coronary artery bypass graft (N/A, 09/02/2015); TEE without cardioversion (N/A, 09/02/2015); and LEFT HEART CATH AND CORS/GRAFTS ANGIOGRAPHY (N/A, 08/30/2017).  Family history His family history includes CAD in his father; Cancer in his mother.  Social history He  reports that he has been smoking Cigarettes.  He has a 17.50 pack-year smoking history. He has never used smokeless tobacco. He reports that he does not drink alcohol or use drugs.  No Known Allergies  Current Outpatient Prescriptions on File Prior to Visit  Medication Sig  . acetaminophen (TYLENOL) 325 MG tablet Take 2 tablets (650 mg total) by mouth every 4 (four) hours as needed for headache or mild pain.  Marland Kitchen allopurinol (ZYLOPRIM) 300 MG tablet TAKE ONE TABLET BY MOUTH ONCE DAILY (Patient taking differently: Take 300 mg by mouth once a day)  . aspirin EC 81 MG tablet Take 81 mg by mouth daily.  Marland Kitchen atorvastatin (LIPITOR) 40 MG tablet TAKE ONE TABLET BY MOUTH ONCE DAILY AT 6 PM (Patient taking differently: Take 40 mg by mouth once a day at 6 PM)  . carvedilol (COREG) 3.125 MG tablet Take 1 tablet (3.125 mg total) by mouth 2 (two) times daily with a meal.  . cyclobenzaprine (FLEXERIL) 5 MG tablet Take 1 tablet (5 mg total) by mouth 3 (three) times daily as needed for muscle spasms (of back).  . furosemide (LASIX) 40 MG tablet TAKE ONE TABLET BY MOUTH  ONCE DAILY (Patient taking differently: Take 40 mg by mouth once a day as needed for an overnight weight gain of 3-6 pounds over a baseline weight of 200 pounds)  . isosorbide mononitrate (IMDUR) 30 MG 24 hr tablet TAKE ONE TABLET BY MOUTH ONCE DAILY (Patient taking differently: Take 30 mg by mouth once a day)  . lisinopril (PRINIVIL,ZESTRIL) 10 MG tablet Take 1 tablet (10 mg total) by mouth daily.  Marland Kitchen NITROSTAT 0.4 MG SL tablet DISSOLVE ONE TABLET UNDER THE TONGUE EVERY 5 MINUTES AS NEEDED FOR CHEST PAIN. (Patient taking differently: Dissolve 1 tablet (0.4 mg) under the tongue every 5 minutes as needed for chest pain)  . Omega-3 Fatty Acids (FISH OIL PO) Take 1 capsule by mouth daily.  Marland Kitchen spironolactone (ALDACTONE) 25 MG tablet TAKE ONE TABLET BY MOUTH AT BEDTIME (Patient taking differently: Take 25 mg by mouth at bedtime)  . tiotropium (SPIRIVA) 18 MCG inhalation capsule Place 1 capsule (18 mcg total) into inhaler and inhale daily.   No current facility-administered medications on file prior to visit.     Chief Complaint  Patient presents with  . pulm consult    Pt referred by Dr. Martinique MD; cardiologist. Pt has SOB with exertion; had recent ED visit last week. Pt had [facility] completed [facility] and that's why visit today. Pt has productive cough, coughing up yellow and green mucus.     Pulmonary tests PFT 08/31/15 >> FEV1 3.04 (75%), FEV1% 67, TLC 7.98 (104%), DLCO 61% CT chest 08/31/17 >> severe CAD, TAA 4.1 cm, mod/severe centrilobular emphysema, multiple nodules 2-3 mm  Cardiac tests Echo 08/25/15 >> EF 30 to 35%, grade 1 DD  Past medical  history He  has a past medical history of Acute systolic CHF (congestive heart failure) ([place]); AICD (automatic cardioverter/defibrillator) present; Anxiety; Ascending aortic aneurysm ([place]) (08/31/2017); Back pain (08/31/2017); Basal cell carcinoma; CAD (coronary artery disease); Cardiac tamponade; COPD (chronic obstructive pulmonary disease) ([place]) (08/31/2017); Gout;  History of pneumonia; HTN (hypertension); Hyperlipidemia; Ischemic cardiomyopathy; Left main coronary artery disease; Myocardial infarction ([place]); Obesity; S/P CABG x 2 (09/02/2015); Shortness of breath dyspnea; Tobacco abuse; and Urinary frequency.  Vital signs BP 128/74 (BP Location: Left Arm, Cuff Size: Normal)   Pulse 72   Ht 6\' 1"  (1.854 m)   Wt 208 lb 11.2 oz (94.7 kg)   SpO2 96%   BMI 27.53 kg/m   History of present illness Kerry Mason is a 61 y.o. male smoker with emphysema.  He was in hospital recently for dyspnea.  There was concern for cardiac disease.  He had LHC and showed patent grafts after CABG.  He had D-Dimer that was elevated.  He then had CT chest.  No PE, but did have emphysema and pulmonary nodules.  He still smokes cigarettes.  He has cough with clear sputum.  He is getting intermittent wheezing.  He denies chest pain, fever, weight loss, or hemoptysis.  Not have skin rash, gland swelling, or leg swelling.  No GI symptoms.  He has been using spiriva.  Not sure if this helping.  Hasn't tried any other inhalers.  Has only been using for about 1 week.  No history of pneumonia or exposure to tuberculosis.  No recent travel.  Denies animal/bird exposures.  Has lived in [place] most his life.   Physical exam  General - No distress Eyes - pupils reactive ENT - No sinus tenderness, no oral exudate, no LAN, no thyromegaly, TM clear, pupils equal/reactive Cardiac - s1s2 regular, no murmur, pulses symmetric Chest - No wheeze/rales/dullness, good air entry, normal respiratory excursion Back - No focal tenderness Abd - Soft, non-tender, no organomegaly, + bowel sounds Ext - No edema Neuro - Normal strength, cranial nerves intact Skin - No rashes Psych - Normal mood, and behavior   CMP Latest Ref Rng & Units 08/30/2017 08/29/2017 08/29/2017  Glucose 65 - 99 mg/dL 74 - 98  BUN 6 - 20 mg/dL 17 - 14  Creatinine 0.61 - 1.24 mg/dL 1.00 1.10 0.93  Sodium 135 - 145  mmol/L 136 - 134(L)  Potassium 3.5 - 5.1 mmol/L 4.1 - 4.4  Chloride 101 - 111 mmol/L 105 - 107  CO2 22 - 32 mmol/L 22 - 20(L)  Calcium 8.9 - 10.3 mg/dL 8.8(L) - 8.9  Total Protein 6.1 - 8.1 g/dL - - -  Total Bilirubin 0.2 - 1.2 mg/dL - - -  Alkaline Phos 40 - 115 U/L - - -  AST 10 - 35 U/L - - -  ALT 9 - 46 U/L - - -     CBC Latest Ref Rng & Units 08/29/2017 08/29/2017 12/24/2015  WBC 4.0 - 10.5 K/uL 8.1 6.6 9.9  Hemoglobin 13.0 - 17.0 g/dL 17.0 16.3 16.0  Hematocrit 39.0 - 52.0 % 49.5 46.8 46.6  Platelets 150 - 400 K/uL 209 209 241     ABG    Component Value Date/Time   PHART 7.370 09/02/2015 1821   PCO2ART 34.9 (L) 09/02/2015 1821   PO2ART 57.0 (L) 09/02/2015 1821   HCO3 20.3 09/02/2015 1821   TCO2 20 09/02/2015 2009   ACIDBASEDEF 5.0 (H) 09/02/2015 1821   O2SAT 89.0 09/02/2015 1821  Assessment/plan  COPD with emphysema. - will check alpha 1 antitrypsin level and PFT - will have him try symbicort and continue spiriva  Tobacco abuse. - spent extend time discussion options for smoking cessation - he is will to try chantix >> discussed dosing regimen, setting quit date, and side effects to monitor for  Pulmonary nodules. - will need follow up CT chest w/o contrast in September 2019   Patient Instructions  Lab tests today Will schedule pulmonary function test Symbicort two puffs twice per day and rinse mouth after each use Chantix 1 pill daily for 3 days.  Then 1 pill twice per day.  After taking chantix for 1 week, then stop smoking  Follow up in 4 weeks with Dr. Halford Chessman or Nurse Practitioner    Chesley Mires, MD [facility] Pager:  [phone] 09/05/2017, 4:20 PM

## 2017-09-05 NOTE — Patient Instructions (Signed)
Lab tests today Will schedule pulmonary function test Symbicort two puffs twice per day and rinse mouth after each use Chantix 1 pill daily for 3 days.  Then 1 pill twice per day.  After taking chantix for 1 week, then stop smoking  Follow up in 4 weeks with Dr. Halford Chessman or Nurse Practitioner

## 2017-09-10 ENCOUNTER — Telehealth: Payer: Self-pay | Admitting: Pulmonary Disease

## 2017-09-10 LAB — ALPHA-1 ANTITRYPSIN PHENOTYPE: A-1 Antitrypsin, Ser: 163 mg/dL (ref 83–199)

## 2017-09-10 NOTE — Telephone Encounter (Signed)
A1AT 09/05/17 >> 163   Please tell pt that lab test was normal.  He does not have inherited form of emphysema.

## 2017-09-10 NOTE — Telephone Encounter (Signed)
PA request received for pt's Symbicort.   Called [employer] at [phone] and spoke with [facility] [place] has been approved from 09/10/17-09/05/18.   PA#:[id number] Called [facility] on [address] to make aware.  Nothing further needed.

## 2017-09-11 NOTE — Telephone Encounter (Signed)
Called and spoke with patient today regarding results per VS.  Informed the patient of his results today and recommendations per VS. The patient verbalized understanding and denied any questions or concerns at this time. Nothing further needed.

## 2017-09-14 ENCOUNTER — Encounter: Payer: Self-pay | Admitting: Physician Assistant

## 2017-09-14 ENCOUNTER — Ambulatory Visit (INDEPENDENT_AMBULATORY_CARE_PROVIDER_SITE_OTHER): Payer: Medicaid Other | Admitting: Physician Assistant

## 2017-09-14 VITALS — BP 108/74 | HR 72 | Ht 73.0 in | Wt 208.0 lb

## 2017-09-14 DIAGNOSIS — I712 Thoracic aortic aneurysm, without rupture: Secondary | ICD-10-CM | POA: Diagnosis not present

## 2017-09-14 DIAGNOSIS — Z95810 Presence of automatic (implantable) cardiac defibrillator: Secondary | ICD-10-CM

## 2017-09-14 DIAGNOSIS — R918 Other nonspecific abnormal finding of lung field: Secondary | ICD-10-CM

## 2017-09-14 DIAGNOSIS — Z720 Tobacco use: Secondary | ICD-10-CM | POA: Diagnosis not present

## 2017-09-14 DIAGNOSIS — I25810 Atherosclerosis of coronary artery bypass graft(s) without angina pectoris: Secondary | ICD-10-CM

## 2017-09-14 DIAGNOSIS — Z79899 Other long term (current) drug therapy: Secondary | ICD-10-CM

## 2017-09-14 DIAGNOSIS — E785 Hyperlipidemia, unspecified: Secondary | ICD-10-CM | POA: Diagnosis not present

## 2017-09-14 DIAGNOSIS — I7121 Aneurysm of the ascending aorta, without rupture: Secondary | ICD-10-CM

## 2017-09-14 NOTE — Patient Instructions (Signed)
Medication Instructions:   No changes to current regimen.  Labwork:   Fasting labwork today.  Testing/Procedures:  none  Follow-Up:  With Dr. Martinique in 3 months  If you need a refill on your cardiac medications before your next appointment, please call your pharmacy.

## 2017-09-14 NOTE — Progress Notes (Signed)
Cardiology Office Note    Date:  09/14/2017   ID:  Kerry Mason, DOB 06/08/1956, MRN [id number]  PCP:  Velna Hatchet, MD  Cardiologist:  Dr. Martinique   Chief Complaint  Patient presents with  . Follow-up    Post CATH. Seen for Dr. Martinique  . Shortness of Breath    History of Present Illness:  Kerry Mason is a 61 y.o. male with CAD s/p CABG and multiple PCI, chronic systolic HF s/p ICD, HLD and tobacco abuse. He had a remote stenting of RCA in 2000 and was lost to follow-up. He ended up having anterior STEMI in January 2015, cardiac catheterization showed occluded proximal RCA with collaterals and the occlusion of the proximal LAD RCA occlusion was felt to be chronic, he was treated with drug-eluting stent to the occluded LAD. EF by cath was 35%. He subsequently required ICD placement. He had Myoview in December 2015 that was high risk with large area of anterior, septal, apical scar and inferior apical ischemia, EF was noted to be 25%. He underwent repeat cardiac cath that showed patent stents in the LAD, but first diagonal had 80% stenosis, chronically occluded RCA with collaterals. Due to his anginal symptom, he underwent a CTO of RCA, procedure was complicated by perforation of mid RCA which resulted in cardiac tamponade and required emergent pericardiocentesis and removal of 1L of blood. Perforation was sealed with Graftmaster and covered stent to the mid RCA. Remainder of his RCA was stented with 4 additional drug-eluting stents. He ended up having pericarditis afterward. He did develop brief PAF during the period of pericarditis.   He was admitted in July 2016 with NSTEMI. Cardiac catheterization showed reocclusion of mid RCA with left-to-right collaterals, he was treated medically with addition of Imdur. He also had a 50% stenosis in the left main, FFR was abnormal, he was referred for CT surgery. He underwent CABG by Dr. Roxy Manns in 9/16 with LIMA to LAD, SVG to ramus, RCA was not  bypassed due to small target and significant scarring from the prior pericarditis. More recently he went to the hospital on 08/29/2017 with chest pain. He eventually underwent cardiac catheterization on 08/30/2017 with Dr. Saunders Revel which showed widely patent LIMA to LAD and SVG to ramus, moderate to severely reduced left ventricular contraction, multiple abnormal vessels arising from the LIMA graft, mediastinal mass is a possibility. Otherwise continue medical therapy and a secondary prevention of CAD. CT of chest showed moderate to severe upper lobe central lobar emphysema, 4.1 cm ascending aortic aneurysm, left lower paratracheal lymphadenopathy likely reactive, several scattered 2-3 mm pulmonary nodule. He was referred to pulmonology service for COPD evaluation.  Since discharge from the hospital, he has been seen by pulmonology service, he has upcoming PFT. Otherwise as for the aortic aneurysm, this can be assessed either with CT of chest or echocardiogram in a year. The CT of the chest in 12 month can also tell us about the pulmonary nodules as well. He is due for a fasting lipid panel and LFT. Otherwise his vital is stable on the current medication. As far as for his heart failure, he is on carvedilol, lisinopril and spironolactone. For his significant coronary artery disease, he is on aspirin, Lipitor and Imdur. Since discharge, he has not had any significant subscapular pain, I recommended continued on current medication. He can follow-up with Dr. Martinique in 3 month.    Past Medical History:  Diagnosis Date  . Acute systolic CHF (congestive heart failure) ([place])   .  AICD (automatic cardioverter/defibrillator) present   . Anxiety   . Ascending aortic aneurysm ([place]) 08/31/2017  . Back pain 08/31/2017  . Basal cell carcinoma    left arm  . CAD (coronary artery disease)    a. 2000: s/p stent of RCA 2000 with BMS  b. 2015: STEMI s/p LHC with old occlusion of RCA and DESx2 to LAD  c. 05/11/11: complicated PCI  on 3/2 for CTO of mid RCA with coronary perforation and cardiac tamponade requiring emergent pericardiocentesis     . Cardiac tamponade    a. 02/03/15 2/2 coronary perforation during CTO procedure. Sealed with graftmaster coated stent.   Marland Kitchen COPD (chronic obstructive pulmonary disease) ([place]) 08/31/2017  . Gout   . History of pneumonia   . HTN (hypertension)   . Hyperlipidemia   . Ischemic cardiomyopathy    a. 2D ECHO: EF 25-30%. Akinesis of the anteroseptal and  . Left main coronary artery disease   . Myocardial infarction ([place])   . Obesity   . 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 Shortness of breath dyspnea   . Tobacco abuse   . Urinary frequency     Past Surgical History:  Procedure Laterality Date  . CARDIAC CATHETERIZATION    . CARDIAC CATHETERIZATION N/A 06/14/2015   Procedure: Left Heart Cath and Coronary Angiography;  Surgeon: Lorretta Harp, MD;  Location: [place] [facility];  Service: Cardiovascular;  Laterality: N/A;  . CARDIAC CATHETERIZATION N/A 08/18/2015   Procedure: Intravascular Pressure Wire/FFR Study;  Surgeon: Peter M Martinique, MD;  Location: [place] [facility];  Service: Cardiovascular;  Laterality: N/A;  . COLONOSCOPY    . CORONARY ANGIOPLASTY  2000  . CORONARY ANGIOPLASTY WITH STENT PLACEMENT  2015  . CORONARY ARTERY BYPASS GRAFT N/A 09/02/2015   Procedure: CORONARY ARTERY BYPASS GRAFTING (CABG) x 2 (LIMA-LAD, SVG-Intermediate) ENDOSCOPIC GREATER SAPHENOUS VEIN HARVEST RIGHT THIGH;  Surgeon: Rexene Alberts, MD;  Location: [facility];  Service: Open Heart Surgery;  Laterality: N/A;  . FOOT SURGERY    . IMPLANTABLE CARDIOVERTER DEFIBRILLATOR IMPLANT N/A 04/17/2014   Procedure: IMPLANTABLE CARDIOVERTER DEFIBRILLATOR IMPLANT;  Surgeon: Evans Lance, MD;  Location: [facility] C[facility];  Service: Cardiovascular;  Laterality: N/A;  . LEFT HEART CATH AND CORS/GRAFTS ANGIOGRAPHY N/A 08/30/2017   Procedure: LEFT HEART CATH AND  CORS/GRAFTS ANGIOGRAPHY;  Surgeon: Nelva Bush, MD;  Location: [place] [facility];  Service: Cardiovascular;  Laterality: N/A;  . LEFT HEART CATHETERIZATION WITH CORONARY ANGIOGRAM N/A 12/11/2013   Procedure: LEFT HEART CATHETERIZATION WITH CORONARY ANGIOGRAM;  Surgeon: Peter M Martinique, MD;  Location: [facility] CATH LAB;  Service: Cardiovascular;  Laterality: N/A;  . LEFT HEART CATHETERIZATION WITH CORONARY ANGIOGRAM N/A 01/05/2015   Procedure: LEFT HEART CATHETERIZATION WITH CORONARY ANGIOGRAM;  Surgeon: Peter M Martinique, MD;  Location: [facility] CATH LAB;  Service: Cardiovascular;  Laterality: N/A;  . PERCUTANEOUS CORONARY STENT INTERVENTION (PCI-S)  12/11/2013   Procedure: PERCUTANEOUS CORONARY STENT INTERVENTION (PCI-S);  Surgeon: Peter M Martinique, MD;  Location[facility] CATH LAB;  Service: Cardiovascular;;  prov LAD and mid LAD  . PERCUTANEOUS CORONARY STENT INTERVENTION (PCI-S) N/A 02/03/2015   Procedure: PERCUTANEOUS CORONARY STENT INTERVENTION (PCI-S);  Surgeon: Jettie Booze, MD;  Location: [facility] CATH LAB;  Service: Cardiovascular;  Laterality: N/A;  . TEE WITHOUT CARDIOVERSION N/A 09/02/2015   Procedure: TRANSESOPHAGEAL ECHOCARDIOGRAM (TEE);  Surgeon: Rexene Alberts, MD;  Location: [place];  Service:  Open Heart Surgery;  Laterality: N/A;    Current Medications: Outpatient Medications Prior to Visit  Medication Sig Dispense Refill  . acetaminophen (TYLENOL) 325 MG tablet Take 2 tablets (650 mg total) by mouth every 4 (four) hours as needed for headache or mild pain.    Marland Kitchen allopurinol (ZYLOPRIM) 300 MG tablet TAKE ONE TABLET BY MOUTH ONCE DAILY (Patient taking differently: Take 300 mg by mouth once a day) 90 tablet 3  . aspirin EC 81 MG tablet Take 81 mg by mouth daily.    Marland Kitchen atorvastatin (LIPITOR) 40 MG tablet TAKE ONE TABLET BY MOUTH ONCE DAILY AT 6 PM (Patient taking differently: Take 40 mg by mouth once a day at 6 PM) 30 tablet 10  . budesonide-formoterol (SYMBICORT) 160-4.5 MCG/ACT inhaler Inhale 2 puffs into  the lungs 2 (two) times daily. 1 Inhaler 6  . carvedilol (COREG) 3.125 MG tablet Take 1 tablet (3.125 mg total) by mouth 2 (two) times daily with a meal. 60 tablet 6  . furosemide (LASIX) 40 MG tablet TAKE ONE TABLET BY MOUTH ONCE DAILY (Patient taking differently: Take 40 mg by mouth once a day as needed for an overnight weight gain of 3-6 pounds over a baseline weight of 200 pounds) 30 tablet 1  . isosorbide mononitrate (IMDUR) 30 MG 24 hr tablet TAKE ONE TABLET BY MOUTH ONCE DAILY (Patient taking differently: Take 30 mg by mouth once a day) 30 tablet 5  . lisinopril (PRINIVIL,ZESTRIL) 10 MG tablet Take 1 tablet (10 mg total) by mouth daily. 30 tablet 6  . NITROSTAT 0.4 MG SL tablet DISSOLVE ONE TABLET UNDER THE TONGUE EVERY 5 MINUTES AS NEEDED FOR CHEST PAIN. (Patient taking differently: Dissolve 1 tablet (0.4 mg) under the tongue every 5 minutes as needed for chest pain) 25 tablet 4  . Omega-3 Fatty Acids (FISH OIL PO) Take 1 capsule by mouth daily.    Marland Kitchen spironolactone (ALDACTONE) 25 MG tablet TAKE ONE TABLET BY MOUTH AT BEDTIME (Patient taking differently: Take 25 mg by mouth at bedtime) 30 tablet 8  . tiotropium (SPIRIVA) 18 MCG inhalation capsule Place 1 capsule (18 mcg total) into inhaler and inhale daily. 30 capsule 12  . varenicline (CHANTIX CONTINUING MONTH PAK) 1 MG tablet Take 1 tablet (1 mg total) by mouth 2 (two) times daily. 60 tablet 2  . budesonide-formoterol (SYMBICORT) 160-4.5 MCG/ACT inhaler Inhale 2 puffs into the lungs 2 (two) times daily. 1 Inhaler 0  . cyclobenzaprine (FLEXERIL) 5 MG tablet Take 1 tablet (5 mg total) by mouth 3 (three) times daily as needed for muscle spasms (of back). 30 tablet 0   No facility-administered medications prior to visit.      Allergies:   Patient has no known allergies.   Social History   Social History  . Marital status: Married    Spouse name: N/A  . Number of children: 2  . Years of education: N/A   Occupational History  .  [employer] auction    Social History Main Topics  . Smoking status: Current Every Day Smoker    Packs/day: 0.50    Years: 35.00    Types: Cigarettes  . Smokeless tobacco: Never Used     Comment: pt. states that brother and friends smoke in home  . Alcohol use No  . Drug use: No  . Sexual activity: Not Currently   Other Topics Concern  . None   Social History Narrative  . None     Family History:  The patient's family history includes CAD in his father; Cancer in his mother.   ROS:   Please see the history of present illness.    ROS All other systems reviewed and are negative.   PHYSICAL EXAM:   VS:  BP 108/74   Pulse 72   Ht 6\' 1"  (1.854 m)   Wt 208 lb (94.3 kg)   BMI 27.44 kg/m    GEN: Well nourished, well developed, in no acute distress  HEENT: normal  Neck: no JVD, carotid bruits, or masses Cardiac: RRR; no murmurs, rubs, or gallops,no edema  Respiratory:  clear to auscultation bilaterally, normal work of breathing GI: soft, nontender, nondistended, + BS MS: no deformity or atrophy  Skin: warm and dry, no rash Neuro:  Alert and Oriented x 3, Strength and sensation are intact Psych: euthymic mood, full affect  Wt Readings from Last 3 Encounters:  09/14/17 208 lb (94.3 kg)  09/05/17 208 lb 11.2 oz (94.7 kg)  08/31/17 202 lb 11.2 oz (91.9 kg)      Studies/Labs Reviewed:   EKG:  EKG is not ordered today.    Recent Labs: 08/29/2017: Hemoglobin 17.0; Platelets 209 08/30/2017: B Natriuretic Peptide 200.2; BUN 17; Creatinine, Ser 1.00; Potassium 4.1; Sodium 136   Lipid Panel    Component Value Date/Time   CHOL 112 (L) 12/24/2015 0909   TRIG 79 12/24/2015 0909   HDL 33 (L) 12/24/2015 0909   CHOLHDL 3.4 12/24/2015 0909   VLDL 16 12/24/2015 0909   LDLCALC 63 12/24/2015 0909    Additional studies/ records that were reviewed today include:   Cath 08/30/2017 FINDINGS: 1. Stable native coronary artery disease with 50% distal LMCA and chronic total  occlusion of ostial RCA. Distal RCA fills via left-to-right collaterals. 2. Widely patent LIMA to LAD and SVG to ramus. 3. Multiple abnormal vessels arising from the LIMA graft. Mediastinal mass is a consideration. 4. Normal left ventricular filling pressure. 5. Moderate to severely reduced left ventricular contraction.  RECOMMENDATIONS: 1. Recommend CT chest to evaluate for intrathoracic mass. 2. Continue medical therapy and secondary prevention of CAD.    ASSESSMENT:    1. Coronary artery disease involving coronary bypass graft of native heart without angina pectoris   2. Hyperlipidemia, unspecified hyperlipidemia type   3. Encounter for long-term (current) use of medications   4. ICD (implantable cardioverter-defibrillator) in place   5. Tobacco abuse   6. Ascending aortic aneurysm (HCC)   7. Pulmonary nodules      PLAN:  In order of problems listed above:  1. CAD s/p CABG: Recent underwent cardiac catheterization for possible angina symptom, no culprit lesion was identified. No obvious anginal symptoms.  2. Tobacco abuse: Strongly urged him to quit smoking. He has began to use Chantix  3. Hyperlipidemia: Continue Lipitor 40 mg daily, will need fasting lipid panel and LFTs today.  4. Chronic systolic heart failure s/p ICD: Followed by Dr. [name]  5. Ascending aortic aneurysm: Measuring 4.1 cm, will need either echocardiogram or CT of chest in 12 months.  6. Pulmonary nodules: Beginning to establish with Dr. Halford Chessman of pulmonology for evaluation of COPD, upcoming PFT.    Medication Adjustments/Labs and Tests Ordered: Current medicines are reviewed at length with the patient today.  Concerns regarding medicines are outlined above.  Medication changes, Labs and Tests ordered today are listed in the Patient Instructions below. Patient Instructions  Medication Instructions:   No changes to current regimen.  Labwork:   Fasting labwork  today.  Testing/Procedures:  none  Follow-Up:  With Dr. Martinique in 3 months  If you need a refill on your cardiac medications before your next appointment, please call your pharmacy.      Hilbert Corrigan, [place]  09/14/2017 1:06 PM    [facility] [address], [place], [place]  [address] Phone: [phone]; Fax: [phone]

## 2017-09-15 LAB — LIPID PANEL
Chol/HDL Ratio: 3.4 ratio (ref 0.0–5.0)
Cholesterol, Total: 116 mg/dL (ref 100–199)
HDL: 34 mg/dL — AB (ref >39)
LDL CALC: 62 mg/dL (ref 0–99)
TRIGLYCERIDES: 100 mg/dL (ref 0–149)
VLDL Cholesterol Cal: 20 mg/dL (ref 5–40)

## 2017-09-15 LAB — HEPATIC FUNCTION PANEL
ALT: 12 IU/L (ref 0–44)
AST: 17 IU/L (ref 0–40)
Albumin: 4.3 g/dL (ref 3.6–4.8)
Alkaline Phosphatase: 66 IU/L (ref 39–117)
Bilirubin Total: 0.4 mg/dL (ref 0.0–1.2)
Bilirubin, Direct: 0.13 mg/dL (ref 0.00–0.40)
Total Protein: 7.5 g/dL (ref 6.0–8.5)

## 2017-09-17 NOTE — Progress Notes (Signed)
Other than low HDL (good cholesterol), the rest of the lipid panel looks good. HDL chronically low for the past 2 years. Liver function normal.

## 2017-09-18 ENCOUNTER — Other Ambulatory Visit: Payer: Self-pay | Admitting: Cardiology

## 2017-09-19 NOTE — Telephone Encounter (Signed)
Rx(s) sent to pharmacy electronically.  

## 2017-10-03 ENCOUNTER — Encounter: Payer: Self-pay | Admitting: Acute Care

## 2017-10-03 ENCOUNTER — Ambulatory Visit (INDEPENDENT_AMBULATORY_CARE_PROVIDER_SITE_OTHER): Payer: Medicaid Other | Admitting: Pulmonary Disease

## 2017-10-03 ENCOUNTER — Telehealth: Payer: Self-pay | Admitting: Acute Care

## 2017-10-03 ENCOUNTER — Ambulatory Visit (INDEPENDENT_AMBULATORY_CARE_PROVIDER_SITE_OTHER): Payer: Medicaid Other | Admitting: Acute Care

## 2017-10-03 DIAGNOSIS — J449 Chronic obstructive pulmonary disease, unspecified: Secondary | ICD-10-CM

## 2017-10-03 DIAGNOSIS — Z720 Tobacco use: Secondary | ICD-10-CM

## 2017-10-03 LAB — PULMONARY FUNCTION TEST
DL/VA % PRED: 59 %
DL/VA: 2.84 ml/min/mmHg/L
DLCO COR: 21.62 ml/min/mmHg
DLCO cor % pred: 59 %
DLCO unc % pred: 60 %
DLCO unc: 22.15 ml/min/mmHg
FEF 25-75 PRE: 1.18 L/s
FEF 25-75 Post: 1.13 L/sec
FEF2575-%CHANGE-POST: -4 %
FEF2575-%PRED-POST: 35 %
FEF2575-%PRED-PRE: 36 %
FEV1-%CHANGE-POST: 0 %
FEV1-%PRED-PRE: 65 %
FEV1-%Pred-Post: 64 %
FEV1-PRE: 2.57 L
FEV1-Post: 2.55 L
FEV1FVC-%Change-Post: 0 %
FEV1FVC-%Pred-Pre: 80 %
FEV6-%Change-Post: -2 %
FEV6-%PRED-POST: 81 %
FEV6-%PRED-PRE: 83 %
FEV6-PRE: 4.17 L
FEV6-Post: 4.06 L
FEV6FVC-%Change-Post: 0 %
FEV6FVC-%Pred-Post: 102 %
FEV6FVC-%Pred-Pre: 103 %
FVC-%Change-Post: -1 %
FVC-%PRED-POST: 79 %
FVC-%Pred-Pre: 81 %
FVC-Post: 4.18 L
FVC-Pre: 4.25 L
POST FEV1/FVC RATIO: 61 %
Post FEV6/FVC ratio: 97 %
Pre FEV1/FVC ratio: 60 %
Pre FEV6/FVC Ratio: 98 %

## 2017-10-03 MED ORDER — AEROCHAMBER MV MISC: 0 refills | Status: DC

## 2017-10-03 MED ORDER — VARENICLINE TARTRATE 0.5 MG X 11 & 1 MG X 42 PO MISC: ORAL | 0 refills | Status: DC

## 2017-10-03 NOTE — Assessment & Plan Note (Addendum)
Continue the Singulair once daily. Symbicort 2 puffs twice daily We will prescribe you a spacer. We will instruct you on its use. Remember to rinse mouth after use. Will need follow up CT chest with contrast 08/2018 Follow up in 3 months with Dr. Halford Chessman Please contact office for sooner follow up if symptoms do not improve or worsen or seek emergency care

## 2017-10-03 NOTE — Assessment & Plan Note (Signed)
Continues to smoke despite use of Chantix Has not been taking Chantix as directed Has been vomiting because he has not been taking Chantix with food Plan Chantix Starter pack. Chantix 1 pill daily for 3 days eat before taking.  Then 1 pill twice per day eat before taking.  After taking chantix for 1 week, then stop smoking If has not quit smoking after 1 additional month on Chantix we will consider to fail Follow-up in 1 month Please contact office for sooner follow up if symptoms do not improve or worsen or seek emergency care

## 2017-10-03 NOTE — Progress Notes (Signed)
History of Present Illness Kerry Mason is a 61 y.o. male current smoker  with emphysema. He is followed by Dr. Halford Chessman.   10/03/2017 Follow up Appointment: Pt. Was seen by Dr. Halford Chessman 09/05/2017.He had been hospitalized 08/29/2017-08/31/2017 for CHF. He was seen as follow up by Dr. Halford Chessman for emphysema.  At the time the patient was not on any maintenance inhalers for his COPD.  He was prescribed Symbicort and Spiriva, and started on Chantix, asked to try and quit smoking. He presents today stating he did not use the Chantix correctly. He started at the back of the pack instead of the front. He did not realize he was supposed to quit smoking after one week.  He is continuing to smoke.  Additionally, he was not taking the Chantix with food and therefore is  vomiting the majority of the medication each time after use.  He states he has been compliant with his Symbicort and Spiriva.  He states he does not no significant difference in his dyspnea.  Cough and sputum production are about at baseline.  Patient denies fever, chest pain, orthopnea, or hemoptysis.  Test Results:  Pulmonary tests PFT 10/03/2017>> FEV1 2.57 (65%) TLC, 6.93 (91%), DLCO 60% PFT 08/31/15 >> FEV1 3.04 (75%), FEV1% 67, TLC 7.98 (104%), DLCO 61% CT chest 08/31/17 >> severe CAD, TAA 4.1 cm, mod/severe centrilobular emphysema, multiple nodules 2-3 mm  Cardiac tests Echo 08/25/15 >> EF 30 to 35%, grade 1 DD  CBC Latest Ref Rng & Units 08/29/2017 08/29/2017 12/24/2015  WBC 4.0 - 10.5 K/uL 8.1 6.6 9.9  Hemoglobin 13.0 - 17.0 g/dL 17.0 16.3 16.0  Hematocrit 39.0 - 52.0 % 49.5 46.8 46.6  Platelets 150 - 400 K/uL 209 209 241    BMP Latest Ref Rng & Units 08/30/2017 08/29/2017 08/29/2017  Glucose 65 - 99 mg/dL 74 - 98  BUN 6 - 20 mg/dL 17 - 14  Creatinine 0.61 - 1.24 mg/dL 1.00 1.10 0.93  Sodium 135 - 145 mmol/L 136 - 134(L)  Potassium 3.5 - 5.1 mmol/L 4.1 - 4.4  Chloride 101 - 111 mmol/L 105 - 107  CO2 22 - 32 mmol/L 22 - 20(L)  Calcium  8.9 - 10.3 mg/dL 8.8(L) - 8.9    BNP    Component Value Date/Time   BNP 200.2 (H) 08/30/2017 0940   BNP 57.6 12/29/2014 1015    ProBNP    Component Value Date/Time   PROBNP 474.9 (H) 06/10/2014 1349    PFT    Component Value Date/Time   FEV1PRE 2.57 10/03/2017 1446   FEV1POST 2.55 10/03/2017 1446   FVCPRE 4.25 10/03/2017 1446   FVCPOST 4.18 10/03/2017 1446   TLC 7.98 08/31/2015 0905   DLCOUNC 22.15 10/03/2017 1446   PREFEV1FVCRT 60 10/03/2017 1446   PSTFEV1FVCRT 61 10/03/2017 1446    No results found.   Past medical hx Past Medical History:  Diagnosis Date  . Acute systolic CHF (congestive heart failure) ([place])   . AICD (automatic cardioverter/defibrillator) present   . Anxiety   . Ascending aortic aneurysm ([place]) 08/31/2017  . Back pain 08/31/2017  . Basal cell carcinoma    left arm  . CAD (coronary artery disease)    a. 2000: s/p stent of RCA 2000 with BMS  b. 2015: STEMI s/p LHC with old occlusion of RCA and DESx2 to LAD  c. 4/0/81: complicated PCI on 3/2 for CTO of mid RCA with coronary perforation and cardiac tamponade requiring emergent pericardiocentesis     . Cardiac  tamponade    a. 02/03/15 2/2 coronary perforation during CTO procedure. Sealed with graftmaster coated stent.   Marland Kitchen COPD (chronic obstructive pulmonary disease) ([place]) 08/31/2017  . Gout   . History of pneumonia   . HTN (hypertension)   . Hyperlipidemia   . Ischemic cardiomyopathy    a. 2D ECHO: EF 25-30%. Akinesis of the anteroseptal and  . Left main coronary artery disease   . Myocardial infarction ([place])   . Obesity   . 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 Shortness of breath dyspnea   . Tobacco abuse   . Urinary frequency      Social History  Substance Use Topics  . Smoking status: Current Every Day Smoker    Packs/day: 0.50    Years: 35.00    Types: Cigarettes  . Smokeless tobacco: Never Used     Comment: pt states he is  currently smoking 3-5 cigarettes daily 10/03/17  . Alcohol use No    Mr.Lovecchio reports that he has been smoking Cigarettes.  He has a 17.50 pack-year smoking history. He has never used smokeless tobacco. He reports that he does not drink alcohol or use drugs.  Tobacco Cessation: Current every day smoker I have spent 10 minutes counseling patient on smoking cessation this visit.  Past surgical hx, Family hx, Social hx all reviewed.  Current Outpatient Prescriptions on File Prior to Visit  Medication Sig  . acetaminophen (TYLENOL) 325 MG tablet Take 2 tablets (650 mg total) by mouth every 4 (four) hours as needed for headache or mild pain.  Marland Kitchen allopurinol (ZYLOPRIM) 300 MG tablet TAKE ONE TABLET BY MOUTH ONCE DAILY (Patient taking differently: Take 300 mg by mouth once a day)  . aspirin EC 81 MG tablet Take 81 mg by mouth daily.  Marland Kitchen atorvastatin (LIPITOR) 40 MG tablet TAKE ONE TABLET BY MOUTH ONCE DAILY AT 6 PM (Patient taking differently: Take 40 mg by mouth once a day at 6 PM)  . budesonide-formoterol (SYMBICORT) 160-4.5 MCG/ACT inhaler Inhale 2 puffs into the lungs 2 (two) times daily.  . carvedilol (COREG) 3.125 MG tablet Take 1 tablet (3.125 mg total) by mouth 2 (two) times daily with a meal.  . furosemide (LASIX) 40 MG tablet TAKE ONE TABLET BY MOUTH ONCE DAILY (Patient taking differently: Take 40 mg by mouth once a day as needed for an overnight weight gain of 3-6 pounds over a baseline weight of 200 pounds)  . isosorbide mononitrate (IMDUR) 30 MG 24 hr tablet TAKE ONE TABLET BY MOUTH ONCE DAILY (Patient taking differently: Take 30 mg by mouth once a day)  . nitroGLYCERIN (NITROSTAT) 0.4 MG SL tablet DISSOLVE ONE TABLET UNDER THE TONGUE EVERY 5 MINUTES AS NEEDED FOR CHEST PAIN.  DO NOT EXCEED A TOTAL OF 3 DOSES IN 15 MINUTES  . Omega-3 Fatty Acids (FISH OIL PO) Take 1 capsule by mouth daily.  Marland Kitchen spironolactone (ALDACTONE) 25 MG tablet TAKE ONE TABLET BY MOUTH AT BEDTIME (Patient taking  differently: Take 25 mg by mouth at bedtime)  . tiotropium (SPIRIVA) 18 MCG inhalation capsule Place 1 capsule (18 mcg total) into inhaler and inhale daily.  . varenicline (CHANTIX CONTINUING MONTH PAK) 1 MG tablet Take 1 tablet (1 mg total) by mouth 2 (two) times daily.   No current facility-administered medications on file prior to visit.      No Known Allergies  Review Of Systems:  Constitutional:  No  weight loss, night sweats,  Fevers, chills, fatigue, or  lassitude.  HEENT:   No headaches,  Difficulty swallowing,  Tooth/dental problems, or  Sore throat,                No sneezing, itching, ear ache, nasal congestion, post nasal drip,   CV:  No chest pain,  Orthopnea, PND, swelling in lower extremities, anasarca, dizziness, palpitations, syncope.   GI  No heartburn, indigestion, abdominal pain, nausea, vomiting, diarrhea, change in bowel habits, loss of appetite, bloody stools.   Resp: + shortness of breath with exertion less at rest.  + excess mucus, + productive cough,  No non-productive cough,  No coughing up of blood.  No change in color of mucus.  No wheezing.  No chest wall deformity  Skin: no rash or lesions.  GU: no dysuria, change in color of urine, no urgency or frequency.  No flank pain, no hematuria   MS:  No joint pain or swelling.  No decreased range of motion.  No back pain.  Psych:  No change in mood or affect. No depression or anxiety.  No memory loss.   Vital Signs BP 124/70 (BP Location: Left Arm, Cuff Size: Normal)   Pulse 74   Ht 6\' 1"  (1.854 m)   Wt 214 lb (97.1 kg)   SpO2 96%   BMI 28.23 kg/m    Physical Exam:  General- No distress,  A&Ox3  ENT: No sinus tenderness, TM clear, pale nasal mucosa, no oral exudate,no post nasal drip, no LAN Cardiac: S1, S2, regular rate and rhythm, no murmur Chest: No wheeze/ rales/ dullness; no accessory muscle use, no nasal flaring, no sternal retractions Abd.: Soft Non-tender Ext: No clubbing cyanosis,  edema Neuro:  normal strength Skin: No rashes, warm and dry Psych: normal mood and behavior   Assessment/Plan  COPD (chronic obstructive pulmonary disease) (HCC) Continue the Singulair once daily. Symbicort 2 puffs twice daily We will prescribe you a spacer. We will instruct you on its use. Remember to rinse mouth after use. Will need follow up CT chest with contrast 08/2018 Follow up in 3 months with Dr. Halford Chessman Please contact office for sooner follow up if symptoms do not improve or worsen or seek emergency care    Tobacco abuse Continues to smoke despite use of Chantix Has not been taking Chantix as directed Has been vomiting because he has not been taking Chantix with food Plan Chantix Starter pack. Chantix 1 pill daily for 3 days eat before taking.  Then 1 pill twice per day eat before taking.  After taking chantix for 1 week, then stop smoking If has not quit smoking after 1 additional month on Chantix we will consider to fail Follow-up in 1 month Please contact office for sooner follow up if symptoms do not improve or worsen or seek emergency care      Magdalen Spatz, NP 10/03/2017  8:25 PM

## 2017-10-03 NOTE — Patient Instructions (Signed)
PFT done today. 

## 2017-10-03 NOTE — Patient Instructions (Addendum)
It is nice to meet you today. Chantix Starter pack. Chantix 1 pill daily for 3 days eat before taking.  Then 1 pill twice per day eat before taking.  After taking chantix for 1 week, then stop smoking Continue the Singulair once daily. Symbicort 2 puffs twice daily We will prescribe you a spacer. We will instruct you on its use. Remember to rinse mouth after use. Follow up in 3 months with Dr. Halford Chessman Will need follow up CT chest with contrast 08/2018 Please contact office for sooner follow up if symptoms do not improve or worsen or seek emergency care

## 2017-10-03 NOTE — Telephone Encounter (Signed)
Please call patient and let them know he will need a 1 month follow-up to check how he is doing on his Chantix. Please schedule I month follow-up Thank you

## 2017-10-04 NOTE — Progress Notes (Signed)
I have reviewed and agree with assessment/plan.  Chesley Mires, MD [facility] Pulmonary/Critical Care 10/04/2017, 10:12 AM Pager:  [phone]

## 2017-10-05 NOTE — Telephone Encounter (Signed)
Called pt and scheduled him for a f/u visit. Nothing further needed.

## 2017-10-10 ENCOUNTER — Ambulatory Visit (INDEPENDENT_AMBULATORY_CARE_PROVIDER_SITE_OTHER): Payer: Medicaid Other | Admitting: *Deleted

## 2017-10-10 DIAGNOSIS — I255 Ischemic cardiomyopathy: Secondary | ICD-10-CM

## 2017-10-10 NOTE — Progress Notes (Signed)
Remote ICD transmission.   

## 2017-10-11 ENCOUNTER — Encounter: Payer: Self-pay | Admitting: Cardiology

## 2017-10-19 LAB — CUP PACEART REMOTE DEVICE CHECK
Battery Remaining Percentage: 82 %
Brady Statistic AS VP Percent: 0 %
HIGH POWER IMPEDANCE MEASURED VALUE: 84 Ohm
Implantable Lead Location: 753860
Implantable Lead Model: 365500
Implantable Lead Serial Number: 10579264
Lead Channel Impedance Value: 574 Ohm
Lead Channel Setting Pacing Amplitude: 2.5 V
Lead Channel Setting Sensing Sensitivity: 0.8 mV
MDC IDC LEAD IMPLANT DT: 20150515
MDC IDC MSMT BATTERY VOLTAGE: 3.12 V
MDC IDC PG IMPLANT DT: 20150515
MDC IDC PG SERIAL: 60800912
MDC IDC SESS DTM: 20181116052820
MDC IDC SET LEADCHNL RV PACING PULSEWIDTH: 0.4 ms
MDC IDC STAT BRADY AS VS PERCENT: 100 %
MDC IDC STAT BRADY RV PERCENT PACED: 0 %

## 2017-10-31 ENCOUNTER — Ambulatory Visit: Payer: Medicaid Other | Admitting: Acute Care

## 2017-10-31 NOTE — Progress Notes (Deleted)
History of Present Illness Kerry Mason is a 61 y.o. male current smoker  with emphysema. He is followed by Dr. Halford Mason.  Synopsis: Pt. Was seen by Dr. Halford Mason 09/05/2017.He had been hospitalized 08/29/2017-08/31/2017 for CHF. He was seen as follow up by Dr. Halford Mason for emphysema.  At the time the patient was not on any maintenance inhalers for his COPD.  He was prescribed Symbicort and Spiriva, and started on Chantix, asked to try and quit smoking. He presents today stating he did not use the Chantix correctly. He started at the back of the pack instead of the front. He did not realize he was supposed to quit smoking after one week.  He is continuing to smoke. Additionally, he was not taking the Chantix with food and therefore was   vomiting the majority of the medication each time after use.  He states he has been compliant with his Symbicort and Spiriva but did  not notice  significant difference in his dyspnea with use of the maintenance inhalers.    10/31/2017 One month Follow up For Chantix Compliance: Pt. Presents today for follow up of second attempt of Chantix treatment for smoking cessation. He states he is     Test Results: Pulmonary tests PFT 10/03/2017>> FEV1 2.57 (65%) TLC, 6.93 (91%), DLCO 60% PFT 08/31/15 >> FEV1 3.04 (75%), FEV1% 67, TLC 7.98 (104%), DLCO 61% CT chest 08/31/17 >> severe CAD, TAA 4.1 cm, mod/severe centrilobular emphysema, multiple nodules 2-3 mm  Cardiac tests Echo 08/25/15 >> EF 30 to 35%, grade 1 DD  CBC Latest Ref Rng & Units 08/29/2017 08/29/2017 12/24/2015  WBC 4.0 - 10.5 K/uL 8.1 6.6 9.9  Hemoglobin 13.0 - 17.0 g/dL 17.0 16.3 16.0  Hematocrit 39.0 - 52.0 % 49.5 46.8 46.6  Platelets 150 - 400 K/uL 209 209 241    BMP Latest Ref Rng & Units 08/30/2017 08/29/2017 08/29/2017  Glucose 65 - 99 mg/dL 74 - 98  BUN 6 - 20 mg/dL 17 - 14  Creatinine 0.61 - 1.24 mg/dL 1.00 1.10 0.93  Sodium 135 - 145 mmol/L 136 - 134(L)  Potassium 3.5 - 5.1 mmol/L 4.1 - 4.4  Chloride  101 - 111 mmol/L 105 - 107  CO2 22 - 32 mmol/L 22 - 20(L)  Calcium 8.9 - 10.3 mg/dL 8.8(L) - 8.9    BNP    Component Value Date/Time   BNP 200.2 (H) 08/30/2017 0940   BNP 57.6 12/29/2014 1015    ProBNP    Component Value Date/Time   PROBNP 474.9 (H) 06/10/2014 1349    PFT    Component Value Date/Time   FEV1PRE 2.57 10/03/2017 1446   FEV1POST 2.55 10/03/2017 1446   FVCPRE 4.25 10/03/2017 1446   FVCPOST 4.18 10/03/2017 1446   TLC 7.98 08/31/2015 0905   DLCOUNC 22.15 10/03/2017 1446   PREFEV1FVCRT 60 10/03/2017 1446   PSTFEV1FVCRT 61 10/03/2017 1446    No results found.   Past medical hx Past Medical History:  Diagnosis Date  . Acute systolic CHF (congestive heart failure) ([place])   . AICD (automatic cardioverter/defibrillator) present   . Anxiety   . Ascending aortic aneurysm ([place]) 08/31/2017  . Back pain 08/31/2017  . Basal cell carcinoma    left arm  . CAD (coronary artery disease)    a. 2000: s/p stent of RCA 2000 with BMS  b. 2015: STEMI s/p LHC with old occlusion of RCA and DESx2 to LAD  c. 05/13/47: complicated PCI on 3/2 for CTO of mid RCA  with coronary perforation and cardiac tamponade requiring emergent pericardiocentesis     . Cardiac tamponade    a. 02/03/15 2/2 coronary perforation during CTO procedure. Sealed with graftmaster coated stent.   Marland Kitchen COPD (chronic obstructive pulmonary disease) ([place]) 08/31/2017  . Gout   . History of pneumonia   . HTN (hypertension)   . Hyperlipidemia   . Ischemic cardiomyopathy    a. 2D ECHO: EF 25-30%. Akinesis of the anteroseptal and  . Left main coronary artery disease   . Myocardial infarction ([place])   . Obesity   . 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 Shortness of breath dyspnea   . Tobacco abuse   . Urinary frequency      Social History   Tobacco Use  . Smoking status: Current Every Day Smoker    Packs/day: 0.50    Years: 35.00    Pack years: 17.50     Types: Cigarettes  . Smokeless tobacco: Never Used  . Tobacco comment: pt states he is currently smoking 3-5 cigarettes daily 10/03/17  Substance Use Topics  . Alcohol use: No    Alcohol/week: 0.0 oz  . Drug use: No    Mr.Kerry Mason reports that he has been smoking cigarettes.  He has a 17.50 pack-year smoking history. he has never used smokeless tobacco. He reports that he does not drink alcohol or use drugs.  Tobacco Cessation: Ready to quit: Not Answered Counseling given: Not Answered Comment: pt states he is currently smoking 3-5 cigarettes daily 10/03/17 I have spent 5 minutes counseling patient on smoking cessation this visit.  Past surgical hx, Family hx, Social hx all reviewed.  Current Outpatient Medications on File Prior to Visit  Medication Sig  . acetaminophen (TYLENOL) 325 MG tablet Take 2 tablets (650 mg total) by mouth every 4 (four) hours as needed for headache or mild pain.  Marland Kitchen allopurinol (ZYLOPRIM) 300 MG tablet TAKE ONE TABLET BY MOUTH ONCE DAILY (Patient taking differently: Take 300 mg by mouth once a day)  . aspirin EC 81 MG tablet Take 81 mg by mouth daily.  Marland Kitchen atorvastatin (LIPITOR) 40 MG tablet TAKE ONE TABLET BY MOUTH ONCE DAILY AT 6 PM (Patient taking differently: Take 40 mg by mouth once a day at 6 PM)  . budesonide-formoterol (SYMBICORT) 160-4.5 MCG/ACT inhaler Inhale 2 puffs into the lungs 2 (two) times daily.  . carvedilol (COREG) 3.125 MG tablet Take 1 tablet (3.125 mg total) by mouth 2 (two) times daily with a meal.  . furosemide (LASIX) 40 MG tablet TAKE ONE TABLET BY MOUTH ONCE DAILY (Patient taking differently: Take 40 mg by mouth once a day as needed for an overnight weight gain of 3-6 pounds over a baseline weight of 200 pounds)  . isosorbide mononitrate (IMDUR) 30 MG 24 hr tablet TAKE ONE TABLET BY MOUTH ONCE DAILY (Patient taking differently: Take 30 mg by mouth once a day)  . lisinopril (PRINIVIL,ZESTRIL) 5 MG tablet Take 5 mg by mouth daily.  .  nitroGLYCERIN (NITROSTAT) 0.4 MG SL tablet DISSOLVE ONE TABLET UNDER THE TONGUE EVERY 5 MINUTES AS NEEDED FOR CHEST PAIN.  DO NOT EXCEED A TOTAL OF 3 DOSES IN 15 MINUTES  . Omega-3 Fatty Acids (FISH OIL PO) Take 1 capsule by mouth daily.  Marland Kitchen Spacer/Aero-Holding Chambers (AEROCHAMBER MV) inhaler Use as instructed  . spironolactone (ALDACTONE) 25 MG tablet TAKE ONE TABLET BY MOUTH AT BEDTIME (Patient taking  differently: Take 25 mg by mouth at bedtime)  . tiotropium (SPIRIVA) 18 MCG inhalation capsule Place 1 capsule (18 mcg total) into inhaler and inhale daily.  . varenicline (CHANTIX CONTINUING MONTH PAK) 1 MG tablet Take 1 tablet (1 mg total) by mouth 2 (two) times daily.  . varenicline (CHANTIX PAK) 0.5 MG X 11 & 1 MG X 42 tablet Take one  tablet by mouth once daily for 3 days, then increase to one tablet twice daily for 4 days, then STOP smoking.   No current facility-administered medications on file prior to visit.      No Known Allergies  Review Of Systems:  Constitutional:   No  weight loss, night sweats,  Fevers, chills, fatigue, or  lassitude.  HEENT:   No headaches,  Difficulty swallowing,  Tooth/dental problems, or  Sore throat,                No sneezing, itching, ear ache, nasal congestion, post nasal drip,   CV:  No chest pain,  Orthopnea, PND, swelling in lower extremities, anasarca, dizziness, palpitations, syncope.   GI  No heartburn, indigestion, abdominal pain, nausea, vomiting, diarrhea, change in bowel habits, loss of appetite, bloody stools.   Resp: No shortness of breath with exertion or at rest.  No excess mucus, no productive cough,  No non-productive cough,  No coughing up of blood.  No change in color of mucus.  No wheezing.  No chest wall deformity  Skin: no rash or lesions.  GU: no dysuria, change in color of urine, no urgency or frequency.  No flank pain, no hematuria   MS:  No joint pain or swelling.  No decreased range of motion.  No back pain.  Psych:  No  change in mood or affect. No depression or anxiety.  No memory loss.   Vital Signs There were no vitals taken for this visit.   Physical Exam:  General- No distress,  A&Ox3 ENT: No sinus tenderness, TM clear, pale nasal mucosa, no oral exudate,no post nasal drip, no LAN Cardiac: S1, S2, regular rate and rhythm, no murmur Chest: No wheeze/ rales/ dullness; no accessory muscle use, no nasal flaring, no sternal retractions Abd.: Soft Non-tender Ext: No clubbing cyanosis, edema Neuro:  normal strength Skin: No rashes, warm and dry Psych: normal mood and behavior   Assessment/Plan  No problem-specific Assessment & Plan notes found for this encounter.    Magdalen Spatz, NP 10/31/2017  12:55 PM

## 2017-11-25 ENCOUNTER — Other Ambulatory Visit: Payer: Self-pay | Admitting: Cardiology

## 2017-11-25 ENCOUNTER — Other Ambulatory Visit: Payer: Self-pay | Admitting: Nurse Practitioner

## 2017-11-25 ENCOUNTER — Other Ambulatory Visit: Payer: Self-pay | Admitting: Pulmonary Disease

## 2017-11-26 NOTE — Telephone Encounter (Signed)
REFILL 

## 2017-11-28 DIAGNOSIS — I219 Acute myocardial infarction, unspecified: Secondary | ICD-10-CM | POA: Insufficient documentation

## 2017-11-28 DIAGNOSIS — Z8701 Personal history of pneumonia (recurrent): Secondary | ICD-10-CM | POA: Insufficient documentation

## 2017-11-28 DIAGNOSIS — Z95810 Presence of automatic (implantable) cardiac defibrillator: Secondary | ICD-10-CM | POA: Insufficient documentation

## 2017-11-28 DIAGNOSIS — I5021 Acute systolic (congestive) heart failure: Secondary | ICD-10-CM | POA: Insufficient documentation

## 2017-11-28 DIAGNOSIS — R350 Frequency of micturition: Secondary | ICD-10-CM | POA: Insufficient documentation

## 2017-11-28 DIAGNOSIS — C4491 Basal cell carcinoma of skin, unspecified: Secondary | ICD-10-CM | POA: Insufficient documentation

## 2017-11-28 DIAGNOSIS — I255 Ischemic cardiomyopathy: Secondary | ICD-10-CM | POA: Insufficient documentation

## 2017-11-28 DIAGNOSIS — I2510 Atherosclerotic heart disease of native coronary artery without angina pectoris: Secondary | ICD-10-CM | POA: Insufficient documentation

## 2017-11-28 DIAGNOSIS — E669 Obesity, unspecified: Secondary | ICD-10-CM | POA: Insufficient documentation

## 2017-11-28 DIAGNOSIS — I314 Cardiac tamponade: Secondary | ICD-10-CM | POA: Insufficient documentation

## 2017-12-13 NOTE — Progress Notes (Signed)
Cardiology Office Note    Date:  12/17/2017   ID:  Kerry Mason, DOB 01/14/1956, MRN [id number]  PCP:  Velna Hatchet, MD  Cardiologist:  Dr. Martinique   Chief Complaint  Patient presents with  . Follow-up  . Coronary Artery Disease  . Congestive Heart Failure    History of Present Illness:  Kerry Mason is a 62 y.o. male with CAD s/p CABG and multiple PCI, chronic systolic HF s/p ICD, HLD and tobacco abuse. He had a remote stenting of RCA in 2000 and was lost to follow-up. He ended up having anterior STEMI in January 2015, cardiac catheterization showed occluded proximal RCA with collaterals and the occlusion of the proximal LAD RCA occlusion was felt to be chronic, he was treated with drug-eluting stent to the occluded LAD. EF by cath was 35%. He subsequently required ICD placement. He had Myoview in December 2015 that was high risk with large area of anterior, septal, apical scar and inferior apical ischemia, EF was noted to be 25%. He underwent repeat cardiac cath that showed patent stents in the LAD, but first diagonal had 80% stenosis, chronically occluded RCA with collaterals. Due to his anginal symptom, he underwent a CTO of RCA, procedure was complicated by perforation of mid RCA which resulted in cardiac tamponade and required emergent pericardiocentesis and removal of 1L of blood. Perforation was sealed with Graftmaster and covered stent to the mid RCA. Remainder of his RCA was stented with 4 additional drug-eluting stents. He ended up having pericarditis afterward. He did develop brief PAF during the period of pericarditis.   He was admitted in July 2016 with NSTEMI. Cardiac catheterization showed reocclusion of mid RCA with left-to-right collaterals, he was treated medically with addition of Imdur. He also had a 50% stenosis in the left main, FFR was abnormal, he was referred for CT surgery. He underwent CABG by Dr. Roxy Manns in 9/16 with LIMA to LAD, SVG to ramus, RCA was not bypassed  due to small target and significant scarring from the prior pericarditis.   He was admitted to the hospital on 08/29/2017 with chest pain. He eventually underwent cardiac catheterization on 08/30/2017 with Dr. Saunders Revel which showed widely patent LIMA to LAD and SVG to ramus, moderate to severely reduced left ventricular contraction, multiple abnormal vessels arising from the LIMA graft, mediastinal mass is a possibility. Otherwise continue medical therapy and a secondary prevention of CAD. CT of chest showed moderate to severe upper lobe central lobar emphysema, 4.1 cm ascending aortic aneurysm, left lower paratracheal lymphadenopathy likely reactive, several scattered 2-3 mm pulmonary nodule. He was referred to pulmonology service for COPD evaluation.  He was seen by pulmonology service and had PFTs. Inhalers adjusted.   ICD check in November was stable.  On follow up today he is doing very well. Notes chronic SOB when walking up hill but this is unchanged. Walks 3-5 miles/day.  He denies any chest pain or palpitations. He is now 40 days into smoking cessation with Chantix.     Past Medical History:  Diagnosis Date  . Acute systolic CHF (congestive heart failure) ([place])   . AICD (automatic cardioverter/defibrillator) present   . Anxiety   . Ascending aortic aneurysm ([place]) 08/31/2017  . Back pain 08/31/2017  . Basal cell carcinoma    left arm  . CAD (coronary artery disease)    a. 2000: s/p stent of RCA 2000 with BMS  b. 2015: STEMI s/p LHC with old occlusion of RCA and DESx2 to LAD  c. 4/0/98: complicated PCI on 3/2 for CTO of mid RCA with coronary perforation and cardiac tamponade requiring emergent pericardiocentesis     . Cardiac tamponade    a. 02/03/15 2/2 coronary perforation during CTO procedure. Sealed with graftmaster coated stent.   Marland Kitchen COPD (chronic obstructive pulmonary disease) ([place]) 08/31/2017  . Gout   . History of pneumonia   . HTN (hypertension)   . Hyperlipidemia   . Ischemic  cardiomyopathy    a. 2D ECHO: EF 25-30%. Akinesis of the anteroseptal and  . Left main coronary artery disease   . Myocardial infarction ([place])   . Obesity   . 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 Shortness of breath dyspnea   . Tobacco abuse   . Urinary frequency     Past Surgical History:  Procedure Laterality Date  . CARDIAC CATHETERIZATION    . CARDIAC CATHETERIZATION N/A 06/14/2015   Procedure: Left Heart Cath and Coronary Angiography;  Surgeon: Lorretta Harp, MD;  Location: [place] [facility];  Service: Cardiovascular;  Laterality: N/A;  . CARDIAC CATHETERIZATION N/A 08/18/2015   Procedure: Intravascular Pressure Wire/FFR Study;  Surgeon: Peter M Martinique, MD;  Location: [place] [facility];  Service: Cardiovascular;  Laterality: N/A;  . COLONOSCOPY    . CORONARY ANGIOPLASTY  2000  . CORONARY ANGIOPLASTY WITH STENT PLACEMENT  2015  . CORONARY ARTERY BYPASS GRAFT N/A 09/02/2015   Procedure: CORONARY ARTERY BYPASS GRAFTING (CABG) x 2 (LIMA-LAD, SVG-Intermediate) ENDOSCOPIC GREATER SAPHENOUS VEIN HARVEST RIGHT THIGH;  Surgeon: Rexene Alberts, MD;  Location: [place];  Service: Open Heart Surgery;  Laterality: N/A;  . FOOT SURGERY    . IMPLANTABLE CARDIOVERTER DEFIBRILLATOR IMPLANT N/A 04/17/2014   Procedure: IMPLANTABLE CARDIOVERTER DEFIBRILLATOR IMPLANT;  Surgeon: Evans Lance, MD;  Location: [facility];  Service: Cardiovascular;  Laterality: N/A;  . LEFT HEART CATH AND CORS/GRAFTS ANGIOGRAPHY N/A 08/30/2017   Procedure: LEFT HEART CATH AND CORS/GRAFTS ANGIOGRAPHY;  Surgeon: Nelva Bush, MD;  Location: [place] [facility];  Service: Cardiovascular;  Laterality: N/A;  . LEFT HEART CATHETERIZATION WITH CORONARY ANGIOGRAM N/A 12/11/2013   Procedure: LEFT HEART CATHETERIZATION WITH CORONARY ANGIOGRAM;  Surgeon: Peter M Martinique, MD;  Location: [facility] CATH LAB;  Service: Cardiovascular;  Laterality: N/A;  . LEFT HEART CATHETERIZATION  WITH CORONARY ANGIOGRAM N/A 01/05/2015   Procedure: LEFT HEART CATHETERIZATION WITH CORONARY ANGIOGRAM;  Surgeon: Peter M Martinique, MD;  Location: [facility] CATH LAB;  Service: Cardiovascular;  Laterality: N/A;  . PERCUTANEOUS CORONARY STENT INTERVENTION (PCI-S)  12/11/2013   Procedure: PERCUTANEOUS CORONARY STENT INTERVENTION (PCI-S);  Surgeon: Peter M Martinique, MD;  Location: [facility] CATH LAB;  Service: Cardiovascular;;  prov LAD and mid LAD  . PERCUTANEOUS CORONARY STENT INTERVENTION (PCI-S) N/A 02/03/2015   Procedure: PERCUTANEOUS CORONARY STENT INTERVENTION (PCI-S);  Surgeon: Jettie Booze, MD;  Location: [facility] CATH LAB;  Service: Cardiovascular;  Laterality: N/A;  . TEE WITHOUT CARDIOVERSION N/A 09/02/2015   Procedure: TRANSESOPHAGEAL ECHOCARDIOGRAM (TEE);  Surgeon: Rexene Alberts, MD;  Location: [place];  Service: Open Heart Surgery;  Laterality: N/A;    Current Medications: Outpatient Medications Prior to Visit  Medication Sig Dispense Refill  . allopurinol (ZYLOPRIM) 300 MG tablet TAKE ONE TABLET BY MOUTH ONCE DAILY (Patient taking differently: Take 300 mg by mouth once a day) 90 tablet 3  . aspirin EC 81 MG tablet Take 81 mg by mouth daily.    Marland Kitchen  atorvastatin (LIPITOR) 40 MG tablet TAKE ONE TABLET BY MOUTH ONCE DAILY AT 6 PM (Patient taking differently: Take 40 mg by mouth once a day at 6 PM) 30 tablet 10  . budesonide-formoterol (SYMBICORT) 160-4.5 MCG/ACT inhaler Inhale 2 puffs into the lungs 2 (two) times daily. 1 Inhaler 6  . carvedilol (COREG) 3.125 MG tablet Take 1 tablet (3.125 mg total) by mouth 2 (two) times daily with a meal. 60 tablet 6  . CHANTIX CONTINUING MONTH PAK 1 MG tablet TAKE 1 TABLET BY MOUTH TWICE DAILY 56 tablet 2  . furosemide (LASIX) 40 MG tablet TAKE 1 TABLET BY MOUTH ONCE DAILY 90 tablet 0  . isosorbide mononitrate (IMDUR) 30 MG 24 hr tablet TAKE ONE TABLET BY MOUTH ONCE DAILY (Patient taking differently: Take 30 mg by mouth once a day) 30 tablet 5  . lisinopril (PRINIVIL,ZESTRIL)  5 MG tablet Take 5 mg by mouth daily.    Marland Kitchen lisinopril (PRINIVIL,ZESTRIL) 5 MG tablet TAKE ONE TABLET BY MOUTH ONCE DAILY 90 tablet 0  . nitroGLYCERIN (NITROSTAT) 0.4 MG SL tablet DISSOLVE ONE TABLET UNDER THE TONGUE EVERY 5 MINUTES AS NEEDED FOR CHEST PAIN.  DO NOT EXCEED A TOTAL OF 3 DOSES IN 15 MINUTES 75 tablet 1  . Omega-3 Fatty Acids (FISH OIL PO) Take 1 capsule by mouth daily.    Marland Kitchen Spacer/Aero-Holding Chambers (AEROCHAMBER MV) inhaler Use as instructed 1 each 0  . spironolactone (ALDACTONE) 25 MG tablet TAKE ONE TABLET BY MOUTH AT BEDTIME 30 tablet 8  . tiotropium (SPIRIVA) 18 MCG inhalation capsule Place 1 capsule (18 mcg total) into inhaler and inhale daily. 30 capsule 12  . varenicline (CHANTIX PAK) 0.5 MG X 11 & 1 MG X 42 tablet Take one  tablet by mouth once daily for 3 days, then increase to one tablet twice daily for 4 days, then STOP smoking. 53 tablet 0  . acetaminophen (TYLENOL) 325 MG tablet Take 2 tablets (650 mg total) by mouth every 4 (four) hours as needed for headache or mild pain.     No facility-administered medications prior to visit.      Allergies:   Patient has no known allergies.   Social History   Socioeconomic History  . Marital status: Married    Spouse name: None  . Number of children: 2  . Years of education: None  . Highest education level: None  Social Needs  . Financial resource strain: None  . Food insecurity - worry: None  . Food insecurity - inability: None  . Transportation needs - medical: None  . Transportation needs - non-medical: None  Occupational History  . 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 Smoking status: Current Every Day Smoker    Packs/day: 0.50    Years: 35.00    Pack years: 17.50    Types: Cigarettes  . Smokeless tobacco: Never Used  . Tobacco comment: pt states he is currently smoking 3-5 cigarettes daily 10/03/17  Substance and Sexual Activity  . Alcohol use: No    Alcohol/week: 0.0 oz  . Drug use: No    . Sexual activity: Not Currently  Other Topics Concern  . None  Social History Narrative  . None     Family History:  The patient's family history includes CAD in his father; Cancer in his mother.   ROS:   Please see the history of present illness.    ROS All other systems reviewed and are negative.   PHYSICAL EXAM:  VS:  BP 130/72   Pulse 76    GENERAL:  Well appearing WM in NAD HEENT:  PERRL, EOMI, sclera are clear. Oropharynx is clear. NECK:  No jugular venous distention, carotid upstroke brisk and symmetric, no bruits, no thyromegaly or adenopathy LUNGS:  Clear to auscultation bilaterally CHEST:  Unremarkable HEART:  RRR,  PMI not displaced or sustained,S1 and S2 within normal limits, no S3, no S4: no clicks, no rubs, no murmurs ABD:  Soft, nontender. BS +, no masses or bruits. No hepatomegaly, no splenomegaly EXT:  2 + pulses throughout, no edema, no cyanosis no clubbing SKIN:  Warm and dry.  No rashes NEURO:  Alert and oriented x 3. Cranial nerves II through XII intact. PSYCH:  Cognitively intact    Wt Readings from Last 3 Encounters:  10/03/17 214 lb (97.1 kg)  09/14/17 208 lb (94.3 kg)  09/05/17 208 lb 11.2 oz (94.7 kg)      Studies/Labs Reviewed:   EKG:  EKG is not ordered today.    Recent Labs: 08/29/2017: Hemoglobin 17.0; Platelets 209 08/30/2017: B Natriuretic Peptide 200.2; BUN 17; Creatinine, Ser 1.00; Potassium 4.1; Sodium 136 09/14/2017: ALT 12   Lipid Panel    Component Value Date/Time   CHOL 116 09/14/2017 0931   TRIG 100 09/14/2017 0931   HDL 34 (L) 09/14/2017 0931   CHOLHDL 3.4 09/14/2017 0931   CHOLHDL 3.4 12/24/2015 0909   VLDL 16 12/24/2015 0909   LDLCALC 62 09/14/2017 0931    Additional studies/ records that were reviewed today include:   Pulmonary tests PFT 10/03/2017>> FEV1 2.57 (65%) TLC, 6.93 (91%), DLCO 60% PFT 08/31/15 >> FEV1 3.04 (75%), FEV1% 67, TLC 7.98 (104%), DLCO 61% CT chest 08/31/17 >> severe CAD, TAA 4.1 cm,  mod/severe centrilobular emphysema, multiple nodules 2-3 mm  Cath 08/30/2017 FINDINGS: 1. Stable native coronary artery disease with 50% distal LMCA and chronic total occlusion of ostial RCA. Distal RCA fills via left-to-right collaterals. 2. Widely patent LIMA to LAD and SVG to ramus. 3. Multiple abnormal vessels arising from the LIMA graft. Mediastinal mass is a consideration. 4. Normal left ventricular filling pressure. 5. Moderate to severely reduced left ventricular contraction.  RECOMMENDATIONS: 1. Recommend CT chest to evaluate for intrathoracic mass. 2. Continue medical therapy and secondary prevention of CAD.    ASSESSMENT:    1. Chronic systolic heart failure (HCC)   2. Cardiomyopathy, ischemic-EF 25%   3. Coronary artery disease involving coronary bypass graft of native heart without angina pectoris   4. Hyperlipidemia, unspecified hyperlipidemia type   5. Ascending aortic aneurysm (HCC)      PLAN:   1. CAD s/p CABG: s/p cardiac catheterization for possible angina symptom in September 2018, no culprit lesion was identified. No  anginal symptoms currently. Continue medical therapy.  2. Tobacco abuse: congratulated on efforts at smoking cessation.  3. Hyperlipidemia: Continue Lipitor 40 mg daily, well controlled.  4. Chronic systolic heart failure s/p ICD: appears well compensated today.continue medical therapy.  5. Ascending aortic aneurysm: Measuring 4.1 cm, will need CT of chest in Novemer 2019.  6. Pulmonary nodules: as per pulmonary.    Medication Adjustments/Labs and Tests Ordered: Current medicines are reviewed at length with the patient today.  Concerns regarding medicines are outlined above.  Medication changes, Labs and Tests ordered today are listed in the Patient Instructions below. Patient Instructions  Continue your efforts at smoking cessation  Continue your current therapy      Signed, Peter Martinique, MD  12/17/2017  2:34 PM    [facility] [address], [place], [place]  [address] Phone: [phone]; Fax: [phone]

## 2017-12-17 ENCOUNTER — Encounter: Payer: Self-pay | Admitting: Cardiology

## 2017-12-17 ENCOUNTER — Ambulatory Visit (INDEPENDENT_AMBULATORY_CARE_PROVIDER_SITE_OTHER): Payer: Medicaid Other | Admitting: Cardiology

## 2017-12-17 VITALS — BP 130/72 | HR 76

## 2017-12-17 DIAGNOSIS — I7121 Aneurysm of the ascending aorta, without rupture: Secondary | ICD-10-CM

## 2017-12-17 DIAGNOSIS — E785 Hyperlipidemia, unspecified: Secondary | ICD-10-CM

## 2017-12-17 DIAGNOSIS — I5022 Chronic systolic (congestive) heart failure: Secondary | ICD-10-CM | POA: Diagnosis not present

## 2017-12-17 DIAGNOSIS — I712 Thoracic aortic aneurysm, without rupture: Secondary | ICD-10-CM | POA: Diagnosis not present

## 2017-12-17 DIAGNOSIS — I25810 Atherosclerosis of coronary artery bypass graft(s) without angina pectoris: Secondary | ICD-10-CM

## 2017-12-17 DIAGNOSIS — I255 Ischemic cardiomyopathy: Secondary | ICD-10-CM

## 2017-12-17 NOTE — Patient Instructions (Signed)
Continue your efforts at smoking cessation  Continue your current therapy

## 2017-12-31 ENCOUNTER — Ambulatory Visit: Payer: Medicaid Other | Admitting: Pulmonary Disease

## 2018-01-09 ENCOUNTER — Ambulatory Visit (INDEPENDENT_AMBULATORY_CARE_PROVIDER_SITE_OTHER): Payer: Medicaid Other | Admitting: *Deleted

## 2018-01-09 DIAGNOSIS — I255 Ischemic cardiomyopathy: Secondary | ICD-10-CM

## 2018-01-09 NOTE — Progress Notes (Signed)
Remote ICD transmission.   

## 2018-01-10 ENCOUNTER — Encounter: Payer: Self-pay | Admitting: Cardiology

## 2018-02-11 LAB — CUP PACEART REMOTE DEVICE CHECK
Implantable Lead Implant Date: 20150515
Implantable Lead Location: 753860
Implantable Lead Model: 365500
Implantable Lead Serial Number: 10579264
Implantable Pulse Generator Implant Date: 20150515
MDC IDC PG SERIAL: 60800912
MDC IDC SESS DTM: 20190311194957

## 2018-02-25 ENCOUNTER — Other Ambulatory Visit: Payer: Self-pay | Admitting: Internal Medicine

## 2018-02-27 NOTE — Telephone Encounter (Signed)
This is Dr. Jordan's pt. °

## 2018-02-28 ENCOUNTER — Other Ambulatory Visit: Payer: Self-pay | Admitting: Cardiology

## 2018-03-01 NOTE — Telephone Encounter (Signed)
REFILL 

## 2018-03-29 ENCOUNTER — Other Ambulatory Visit: Payer: Self-pay | Admitting: Cardiology

## 2018-04-10 ENCOUNTER — Ambulatory Visit (INDEPENDENT_AMBULATORY_CARE_PROVIDER_SITE_OTHER): Payer: Medicaid Other | Admitting: *Deleted

## 2018-04-10 DIAGNOSIS — I255 Ischemic cardiomyopathy: Secondary | ICD-10-CM | POA: Diagnosis not present

## 2018-04-10 NOTE — Progress Notes (Signed)
Remote ICD transmission.   

## 2018-04-11 ENCOUNTER — Encounter: Payer: Self-pay | Admitting: Cardiology

## 2018-05-06 ENCOUNTER — Other Ambulatory Visit: Payer: Self-pay | Admitting: Cardiology

## 2018-05-08 LAB — CUP PACEART REMOTE DEVICE CHECK
Date Time Interrogation Session: 20190605091331
Implantable Lead Location: 753860
Implantable Lead Model: 365500
Implantable Pulse Generator Implant Date: 20150515
MDC IDC LEAD IMPLANT DT: 20150515
MDC IDC LEAD SERIAL: 10579264
Pulse Gen Model: 383594
Pulse Gen Serial Number: 60800912

## 2018-06-27 ENCOUNTER — Telehealth: Payer: Self-pay | Admitting: Cardiology

## 2018-06-27 NOTE — Telephone Encounter (Signed)
Spoke w/ pt and informed him that his home monitor is not updating. Pt states that he is sleeping near the monitor. He is going to go home and check to make sure it is plugged in and the OK light is on. He is going to call back to let us know.

## 2018-06-28 NOTE — Telephone Encounter (Signed)
LMOVM for pt to return call 

## 2018-07-04 NOTE — Telephone Encounter (Signed)
Patient home monitor is reconnected.

## 2018-07-10 ENCOUNTER — Ambulatory Visit (INDEPENDENT_AMBULATORY_CARE_PROVIDER_SITE_OTHER): Payer: Medicaid Other | Admitting: *Deleted

## 2018-07-10 DIAGNOSIS — I5022 Chronic systolic (congestive) heart failure: Secondary | ICD-10-CM

## 2018-07-11 NOTE — Progress Notes (Signed)
Remote ICD transmission.   

## 2018-07-31 LAB — CUP PACEART REMOTE DEVICE CHECK
Battery Remaining Percentage: 74 %
Brady Statistic AS VS Percent: 98 %
Brady Statistic RV Percent Paced: 0 %
HighPow Impedance: 84 Ohm
Implantable Lead Implant Date: 20150515
Implantable Lead Location: 753860
Implantable Lead Model: 365500
Implantable Lead Serial Number: 10579264
Implantable Pulse Generator Implant Date: 20150515
Lead Channel Impedance Value: 579 Ohm
Lead Channel Setting Pacing Amplitude: 2.5 V
Lead Channel Setting Sensing Sensitivity: 0.8 mV
MDC IDC MSMT BATTERY VOLTAGE: 3.12 V
MDC IDC SESS DTM: 20190827050326
MDC IDC SET LEADCHNL RV PACING PULSEWIDTH: 0.4 ms
MDC IDC STAT BRADY AS VP PERCENT: 0 %
Pulse Gen Serial Number: 60800912

## 2018-08-06 ENCOUNTER — Other Ambulatory Visit: Payer: Self-pay | Admitting: Cardiology

## 2018-08-20 NOTE — Progress Notes (Signed)
Cardiology Office Note    Date:  08/21/2018   ID:  Kerry Mason, DOB [birth year], MRN [id number]  PCP:  Velna Hatchet, MD  Cardiologist:  Dr. Martinique   Chief Complaint  Patient presents with  . Coronary Artery Disease    History of Present Illness:  Kerry Mason is a 61 y.o. male with CAD s/p CABG and multiple PCI, chronic systolic HF s/p ICD, HLD and tobacco abuse. He had a remote stenting of RCA in 2000 and was lost to follow-up. He ended up having anterior STEMI in January 2015, cardiac catheterization showed occluded proximal RCA with collaterals and the occlusion of the proximal LAD RCA occlusion was felt to be chronic, he was treated with drug-eluting stent to the occluded LAD. EF by cath was 35%. He subsequently required ICD placement. He had Myoview in December 2015 that was high risk with large area of anterior, septal, apical scar and inferior apical ischemia, EF was noted to be 25%. He underwent repeat cardiac cath that showed patent stents in the LAD, but first diagonal had 80% stenosis, chronically occluded RCA with collaterals. Due to his anginal symptom, he underwent a CTO of RCA, procedure was complicated by perforation of mid RCA which resulted in cardiac tamponade and required emergent pericardiocentesis and removal of 1L of blood. Perforation was sealed with Graftmaster and covered stent to the mid RCA. Remainder of his RCA was stented with 4 additional drug-eluting stents. He ended up having pericarditis afterward. He did develop brief PAF during the period of pericarditis.   He was admitted in July 2016 with NSTEMI. Cardiac catheterization showed reocclusion of mid RCA with left-to-right collaterals, he was treated medically with addition of Imdur. He also had a 50% stenosis in the left main, FFR was abnormal, he was referred for CT surgery. He underwent CABG by Dr. Roxy Manns in 9/16 with LIMA to LAD, SVG to ramus, RCA was not bypassed due to small target and significant  scarring from the prior pericarditis.   He was admitted to the hospital on 08/29/2017 with chest pain. He eventually underwent cardiac catheterization on 08/30/2017 with Dr. Saunders Revel which showed widely patent LIMA to LAD and SVG to ramus, moderate to severely reduced left ventricular contraction, multiple abnormal vessels arising from the LIMA graft, mediastinal mass is a possibility. Otherwise continue medical therapy and a secondary prevention of CAD. CT of chest showed moderate to severe upper lobe central lobar emphysema, 4.1 cm ascending aortic aneurysm, left lower paratracheal lymphadenopathy likely reactive, several scattered 2-3 mm pulmonary nodule. He was referred to pulmonology service for COPD evaluation.  ICD check in August 2019 was stable.  On follow up today he is doing very well. Notes chronic mild  SOB when walking up hill but this is unchanged. Walks daily unless the weather is too hot.  He denies any chest pain or palpitations. He still smokes occasionally. He does note some rib pain bilaterally when lying down at night. Wants to try Viagra for ED.    Past Medical History:  Diagnosis Date  . Acute systolic CHF (congestive heart failure) ([place])   . AICD (automatic cardioverter/defibrillator) present   . Anxiety   . Ascending aortic aneurysm ([place]) 08/31/2017  . Back pain 08/31/2017  . Basal cell carcinoma    left arm  . CAD (coronary artery disease)    a. 2000: s/p stent of RCA 2000 with BMS  b. 2015: STEMI s/p LHC with old occlusion of RCA and DESx2 to LAD  c.  06/03/68: complicated PCI on 3/2 for CTO of mid RCA with coronary perforation and cardiac tamponade requiring emergent pericardiocentesis     . Cardiac tamponade    a. 02/03/15 2/2 coronary perforation during CTO procedure. Sealed with graftmaster coated stent.   Marland Kitchen COPD (chronic obstructive pulmonary disease) ([place]) 08/31/2017  . Gout   . History of pneumonia   . HTN (hypertension)   . Hyperlipidemia   . Ischemic cardiomyopathy      a. 2D ECHO: EF 25-30%. Akinesis of the anteroseptal and  . Left main coronary artery disease   . Myocardial infarction ([place])   . Obesity   . 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 Shortness of breath dyspnea   . Tobacco abuse   . Urinary frequency     Past Surgical History:  Procedure Laterality Date  . CARDIAC CATHETERIZATION    . CARDIAC CATHETERIZATION N/A 06/14/2015   Procedure: Left Heart Cath and Coronary Angiography;  Surgeon: Lorretta Harp, MD;  Location: [facility] CV LAB;  Service: Cardiovascular;  Laterality: N/A;  . CARDIAC CATHETERIZATION N/A 08/18/2015   Procedure: Intravascular Pressure Wire/FFR Study;  Surgeon: Peter M Martinique, MD;  Location: [place] [facility] LAB;  Service: Cardiovascular;  Laterality: N/A;  . COLONOSCOPY    . CORONARY ANGIOPLASTY  2000  . CORONARY ANGIOPLASTY WITH STENT PLACEMENT  2015  . CORONARY ARTERY BYPASS GRAFT N/A 09/02/2015   Procedure: CORONARY ARTERY BYPASS GRAFTING (CABG) x 2 (LIMA-LAD, SVG-Intermediate) ENDOSCOPIC GREATER SAPHENOUS VEIN HARVEST RIGHT THIGH;  Surgeon: Rexene Alberts, MD;  Location: [place];  Service: Open Heart Surgery;  Laterality: N/A;  . FOOT SURGERY    . IMPLANTABLE CARDIOVERTER DEFIBRILLATOR IMPLANT N/A 04/17/2014   Procedure: IMPLANTABLE CARDIOVERTER DEFIBRILLATOR IMPLANT;  Surgeon: Evans Lance, MD;  Location: [facility] CATH LAB;  Service: Cardiovascular;  Laterality: N/A;  . LEFT HEART CATH AND CORS/GRAFTS ANGIOGRAPHY N/A 08/30/2017   Procedure: LEFT HEART CATH AND CORS/GRAFTS ANGIOGRAPHY;  Surgeon: Nelva Bush, MD;  Location: [place] CV LAB;  Service: Cardiovascular;  Laterality: N/A;  . LEFT HEART CATHETERIZATION WITH CORONARY ANGIOGRAM N/A 12/11/2013   Procedure: LEFT HEART CATHETERIZATION WITH CORONARY ANGIOGRAM;  Surgeon: Peter M Martinique, MD;  Location: [facility] CATH LAB;  Service: Cardiovascular;  Laterality: N/A;  . LEFT HEART CATHETERIZATION WITH CORONARY  ANGIOGRAM N/A 01/05/2015   Procedure: LEFT HEART CATHETERIZATION WITH CORONARY ANGIOGRAM;  Surgeon: Peter M Martinique, MD;  Location: [facility] CATH LAB;  Service: Cardiovascular;  Laterality: N/A;  . PERCUTANEOUS CORONARY STENT INTERVENTION (PCI-S)  12/11/2013   Procedure: PERCUTANEOUS CORONARY STENT INTERVENTION (PCI-S);  Surgeon: Peter M Martinique, MD;  Location: [facility] CATH LAB;  Service: Cardiovascular;;  prov LAD and mid LAD  . PERCUTANEOUS CORONARY STENT INTERVENTION (PCI-S) N/A 02/03/2015   Procedure: PERCUTANEOUS CORONARY STENT INTERVENTION (PCI-S);  Surgeon: Jettie Booze, MD;  Location: [facility] CATH LAB;  Service: Cardiovascular;  Laterality: N/A;  . TEE WITHOUT CARDIOVERSION N/A 09/02/2015   Procedure: TRANSESOPHAGEAL ECHOCARDIOGRAM (TEE);  Surgeon: Rexene Alberts, MD;  Location: [place];  Service: Open Heart Surgery;  Laterality: N/A;    Current Medications: Outpatient Medications Prior to Visit  Medication Sig Dispense Refill  . acetaminophen (TYLENOL) 325 MG tablet Take 2 tablets (650 mg total) by mouth every 4 (four) hours as needed for headache or mild pain.    Marland Kitchen allopurinol (ZYLOPRIM) 300 MG tablet Take 1 tablet (300 mg total) by mouth daily.  Needs to schedule appointment 30 tablet 1  . aspirin EC 81 MG tablet Take 81 mg by mouth daily.    Marland Kitchen atorvastatin (LIPITOR) 40 MG tablet TAKE 1 TABLET BY MOUTH ONCE DAILY AT  6PM 90 tablet 1  . budesonide-formoterol (SYMBICORT) 160-4.5 MCG/ACT inhaler Inhale 2 puffs into the lungs 2 (two) times daily. 1 Inhaler 6  . carvedilol (COREG) 3.125 MG tablet TAKE 1 TABLET BY MOUTH TWICE DAILY WITH A MEAL 60 tablet 6  . furosemide (LASIX) 40 MG tablet TAKE 1 TABLET BY MOUTH ONCE DAILY 90 tablet 1  . isosorbide mononitrate (IMDUR) 30 MG 24 hr tablet TAKE 1 TABLET BY MOUTH ONCE DAILY 90 tablet 3  . lisinopril (PRINIVIL,ZESTRIL) 5 MG tablet Take 5 mg by mouth daily.    . nitroGLYCERIN (NITROSTAT) 0.4 MG SL tablet DISSOLVE ONE TABLET UNDER THE TONGUE EVERY 5 MINUTES AS  NEEDED FOR CHEST PAIN.  DO NOT EXCEED A TOTAL OF 3 DOSES IN 15 MINUTES 75 tablet 1  . Omega-3 Fatty Acids (FISH OIL PO) Take 1 capsule by mouth daily.    Marland Kitchen Spacer/Aero-Holding Chambers (AEROCHAMBER MV) inhaler Use as instructed 1 each 0  . spironolactone (ALDACTONE) 25 MG tablet TAKE ONE TABLET BY MOUTH AT BEDTIME 30 tablet 8  . tiotropium (SPIRIVA) 18 MCG inhalation capsule Place 1 capsule (18 mcg total) into inhaler and inhale daily. 30 capsule 12  . CHANTIX CONTINUING MONTH PAK 1 MG tablet TAKE 1 TABLET BY MOUTH TWICE DAILY 56 tablet 2  . lisinopril (PRINIVIL,ZESTRIL) 5 MG tablet TAKE 1 TABLET BY MOUTH ONCE DAILY 90 tablet 1  . varenicline (CHANTIX PAK) 0.5 MG X 11 & 1 MG X 42 tablet Take one  tablet by mouth once daily for 3 days, then increase to one tablet twice daily for 4 days, then STOP smoking. 53 tablet 0   No facility-administered medications prior to visit.      Allergies:   Patient has no known allergies.   Social History   Socioeconomic History  . Marital status: Married    Spouse name: Not on file  . Number of children: 2  . Years of education: Not on file  . Highest education level: Not on file  Occupational History  . 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 Financial resource strain: Not on file  . Food insecurity:    Worry: Not on file    Inability: Not on file  . Transportation needs:    Medical: Not on file    Non-medical: Not on file  Tobacco Use  . Smoking status: Current Every Day Smoker    Packs/day: 0.50    Years: 35.00    Pack years: 17.50    Types: Cigarettes  . Smokeless tobacco: Never Used  . Tobacco comment: pt states he is currently smoking 3-5 cigarettes daily 10/03/17  Substance and Sexual Activity  . Alcohol use: No    Alcohol/week: 0.0 standard drinks  . Drug use: No  . Sexual activity: Not Currently  Lifestyle  . Physical activity:    Days per week: Not on file    Minutes per session: Not on file  . Stress: Not on file   Relationships  . Social connections:    Talks on phone: Not on file    Gets together: Not on file    Attends religious service: Not on file    Active member of club or organization: Not on file    Attends meetings of clubs  or organizations: Not on file    Relationship status: Not on file  Other Topics Concern  . Not on file  Social History Narrative  . Not on file     Family History:  The patient's family history includes CAD in his father; Cancer in his mother.   ROS:   Please see the history of present illness.    ROS All other systems reviewed and are negative.   PHYSICAL EXAM:   VS:  BP 106/77   Pulse 80   Ht 6\' 1"  (1.854 m)   Wt 213 lb 12.8 oz (97 kg)   BMI 28.21 kg/m    GENERAL:  Well appearing WM in NAD HEENT:  PERRL, EOMI, sclera are clear. Oropharynx is clear. NECK:  No jugular venous distention, carotid upstroke brisk and symmetric, no bruits, no thyromegaly or adenopathy LUNGS:  Clear to auscultation bilaterally CHEST:  Unremarkable HEART:  RRR,  PMI not displaced or sustained,S1 and S2 within normal limits, no S3, no S4: no clicks, no rubs, no murmurs ABD:  Soft, nontender. BS +, no masses or bruits. No hepatomegaly, no splenomegaly EXT:  2 + pulses throughout, no edema, no cyanosis no clubbing SKIN:  Warm and dry.  No rashes NEURO:  Alert and oriented x 3. Cranial nerves II through XII intact. PSYCH:  Cognitively intact    Wt Readings from Last 3 Encounters:  08/21/18 213 lb 12.8 oz (97 kg)  10/03/17 214 lb (97.1 kg)  09/14/17 208 lb (94.3 kg)      Studies/Labs Reviewed:   EKG:  EKG is ordered today.  NSR with old anterior infarct. I have personally reviewed and interpreted this study.   Recent Labs: 08/29/2017: Hemoglobin 17.0; Platelets 209 08/30/2017: B Natriuretic Peptide 200.2; BUN 17; Creatinine, Ser 1.00; Potassium 4.1; Sodium 136 09/14/2017: ALT 12   Lipid Panel    Component Value Date/Time   CHOL 116 09/14/2017 0931   TRIG 100  09/14/2017 0931   HDL 34 (L) 09/14/2017 0931   CHOLHDL 3.4 09/14/2017 0931   CHOLHDL 3.4 12/24/2015 0909   VLDL 16 12/24/2015 0909   LDLCALC 62 09/14/2017 0931    Additional studies/ records that were reviewed today include:   Pulmonary tests PFT 10/03/2017>> FEV1 2.57 (65%) TLC, 6.93 (91%), DLCO 60% PFT 08/31/15 >> FEV1 3.04 (75%), FEV1% 67, TLC 7.98 (104%), DLCO 61% CT chest 08/31/17 >> severe CAD, TAA 4.1 cm, mod/severe centrilobular emphysema, multiple nodules 2-3 mm  Cath 08/30/2017 FINDINGS: 1. Stable native coronary artery disease with 50% distal LMCA and chronic total occlusion of ostial RCA. Distal RCA fills via left-to-right collaterals. 2. Widely patent LIMA to LAD and SVG to ramus. 3. Multiple abnormal vessels arising from the LIMA graft. Mediastinal mass is a consideration. 4. Normal left ventricular filling pressure. 5. Moderate to severely reduced left ventricular contraction.  RECOMMENDATIONS: 1. Recommend CT chest to evaluate for intrathoracic mass. 2. Continue medical therapy and secondary prevention of CAD.    ASSESSMENT:    1. Chronic systolic heart failure (HCC)   2. Cardiomyopathy, ischemic-EF 25%   3. Coronary artery disease involving coronary bypass graft of native heart without angina pectoris   4. Ascending aortic aneurysm ([place])   5. Hyperlipidemia, unspecified hyperlipidemia type      PLAN:   1. CAD s/p CABG: last cardiac cath in 2018.  No  anginal symptoms currently. Continue medical therapy.  2. Tobacco abuse: reinforced importance of complete smoking cessation.  3. Hyperlipidemia: Continue Lipitor 40 mg daily,  will check fasting labs today.  4. Chronic systolic heart failure s/p ICD: appears well compensated today.continue medical therapy.  5. Ascending aortic aneurysm: Measuring 4.1 cm, will arrange follow up CT chest with contrast.   6. Pulmonary nodules: as per pulmonary.    Medication Adjustments/Labs and Tests Ordered: Current  medicines are reviewed at length with the patient today.  Concerns regarding medicines are outlined above.  Medication changes, Labs and Tests ordered today are listed in the Patient Instructions below. Patient Instructions  We will check lab work today  We will schedule you for a CT of the chest   Follow up in 6 months     Signed, Peter Martinique, MD  08/21/2018 11:00 AM    [address], [place], [place]  [address] Phone: [phone]; Fax: [phone]

## 2018-08-21 ENCOUNTER — Ambulatory Visit (INDEPENDENT_AMBULATORY_CARE_PROVIDER_SITE_OTHER): Payer: Medicaid Other | Admitting: Cardiology

## 2018-08-21 ENCOUNTER — Other Ambulatory Visit: Payer: Self-pay

## 2018-08-21 ENCOUNTER — Encounter: Payer: Self-pay | Admitting: Cardiology

## 2018-08-21 VITALS — BP 106/77 | HR 80 | Ht 73.0 in | Wt 213.8 lb

## 2018-08-21 DIAGNOSIS — I7121 Aneurysm of the ascending aorta, without rupture: Secondary | ICD-10-CM

## 2018-08-21 DIAGNOSIS — I255 Ischemic cardiomyopathy: Secondary | ICD-10-CM

## 2018-08-21 DIAGNOSIS — I5022 Chronic systolic (congestive) heart failure: Secondary | ICD-10-CM | POA: Diagnosis not present

## 2018-08-21 DIAGNOSIS — E785 Hyperlipidemia, unspecified: Secondary | ICD-10-CM

## 2018-08-21 DIAGNOSIS — I712 Thoracic aortic aneurysm, without rupture, unspecified: Secondary | ICD-10-CM

## 2018-08-21 DIAGNOSIS — I25810 Atherosclerosis of coronary artery bypass graft(s) without angina pectoris: Secondary | ICD-10-CM | POA: Diagnosis not present

## 2018-08-21 DIAGNOSIS — Z79899 Other long term (current) drug therapy: Secondary | ICD-10-CM

## 2018-08-21 MED ORDER — SILDENAFIL CITRATE 50 MG PO TABS: 50.0000 mg | ORAL_TABLET | Freq: Every day | ORAL | 3 refills | Status: DC | PRN

## 2018-08-21 NOTE — Patient Instructions (Addendum)
We will check lab work today-cbc.cmp,lipid and tsh  We will schedule you for a CT   Follow up in 6 months

## 2018-08-22 LAB — LIPID PANEL
CHOLESTEROL TOTAL: 148 mg/dL (ref 100–199)
Chol/HDL Ratio: 4.2 ratio (ref 0.0–5.0)
HDL: 35 mg/dL — AB (ref >39)
LDL Calculated: 91 mg/dL (ref 0–99)
TRIGLYCERIDES: 108 mg/dL (ref 0–149)
VLDL Cholesterol Cal: 22 mg/dL (ref 5–40)

## 2018-08-22 LAB — COMPREHENSIVE METABOLIC PANEL
ALK PHOS: 66 IU/L (ref 39–117)
ALT: 15 IU/L (ref 0–44)
AST: 25 IU/L (ref 0–40)
Albumin/Globulin Ratio: 1.3 (ref 1.2–2.2)
Albumin: 4.7 g/dL (ref 3.6–4.8)
BILIRUBIN TOTAL: 0.7 mg/dL (ref 0.0–1.2)
BUN/Creatinine Ratio: 22 (ref 10–24)
BUN: 23 mg/dL (ref 8–27)
CHLORIDE: 100 mmol/L (ref 96–106)
CO2: 20 mmol/L (ref 20–29)
CREATININE: 1.04 mg/dL (ref 0.76–1.27)
Calcium: 9.5 mg/dL (ref 8.6–10.2)
GFR calc Af Amer: 89 mL/min/{1.73_m2} (ref >59)
GFR calc non Af Amer: 77 mL/min/{1.73_m2} (ref >59)
Globulin, Total: 3.6 g/dL (ref 1.5–4.5)
Glucose: 84 mg/dL (ref 65–99)
Potassium: 4.9 mmol/L (ref 3.5–5.2)
Sodium: 137 mmol/L (ref 134–144)
Total Protein: 8.3 g/dL (ref 6.0–8.5)

## 2018-08-22 LAB — CBC
Hematocrit: 44.7 % (ref 37.5–51.0)
Hemoglobin: 15.9 g/dL (ref 13.0–17.7)
MCH: 31.4 pg (ref 26.6–33.0)
MCHC: 35.6 g/dL (ref 31.5–35.7)
MCV: 88 fL (ref 79–97)
PLATELETS: 283 10*3/uL (ref 150–450)
RBC: 5.07 x10E6/uL (ref 4.14–5.80)
RDW: 13.5 % (ref 12.3–15.4)
WBC: 10.1 10*3/uL (ref 3.4–10.8)

## 2018-08-22 LAB — TSH: TSH: 1.22 u[IU]/mL (ref 0.450–4.500)

## 2018-08-23 ENCOUNTER — Other Ambulatory Visit: Payer: Self-pay

## 2018-08-23 MED ORDER — ATORVASTATIN CALCIUM 80 MG PO TABS: 80.0000 mg | ORAL_TABLET | Freq: Every day | ORAL | 6 refills | Status: DC

## 2018-09-02 ENCOUNTER — Encounter: Payer: Medicaid Other | Admitting: Internal Medicine

## 2018-09-10 ENCOUNTER — Ambulatory Visit (INDEPENDENT_AMBULATORY_CARE_PROVIDER_SITE_OTHER)
Admission: RE | Admit: 2018-09-10 | Discharge: 2018-09-10 | Disposition: A | Discharge status: Home / Self Care | Payer: Medicaid Other | Source: Ambulatory Visit | Attending: Cardiology | Admitting: Cardiology

## 2018-09-10 DIAGNOSIS — I712 Thoracic aortic aneurysm, without rupture, unspecified: Secondary | ICD-10-CM

## 2018-09-10 MED ORDER — IOPAMIDOL (ISOVUE-300) INJECTION 61%
100.0000 mL | Freq: Once | INTRAVENOUS | Status: AC | PRN
  Administered 2018-09-10: 100 mL via INTRAVENOUS

## 2018-09-11 ENCOUNTER — Encounter: Payer: Self-pay | Admitting: Internal Medicine

## 2018-09-11 ENCOUNTER — Ambulatory Visit (INDEPENDENT_AMBULATORY_CARE_PROVIDER_SITE_OTHER): Payer: Medicaid Other | Admitting: Internal Medicine

## 2018-09-11 VITALS — BP 114/74 | HR 80 | Ht 73.0 in | Wt 217.8 lb

## 2018-09-11 DIAGNOSIS — Z95810 Presence of automatic (implantable) cardiac defibrillator: Secondary | ICD-10-CM | POA: Diagnosis not present

## 2018-09-11 DIAGNOSIS — R39198 Other difficulties with micturition: Secondary | ICD-10-CM | POA: Diagnosis not present

## 2018-09-11 DIAGNOSIS — I2510 Atherosclerotic heart disease of native coronary artery without angina pectoris: Secondary | ICD-10-CM | POA: Diagnosis not present

## 2018-09-11 DIAGNOSIS — I5022 Chronic systolic (congestive) heart failure: Secondary | ICD-10-CM | POA: Diagnosis not present

## 2018-09-11 LAB — CUP PACEART INCLINIC DEVICE CHECK
Battery Voltage: 3.13 V
Brady Statistic RV Percent Paced: 0 %
Date Time Interrogation Session: 20191009161004
HIGH POWER IMPEDANCE MEASURED VALUE: 103 Ohm
Lead Channel Impedance Value: 637 Ohm
Lead Channel Sensing Intrinsic Amplitude: 24.2 mV
Lead Channel Sensing Intrinsic Amplitude: 4.6 mV
Lead Channel Setting Pacing Amplitude: 2.5 V
Lead Channel Setting Pacing Pulse Width: 0.4 ms
Lead Channel Setting Sensing Sensitivity: 0.8 mV
MDC IDC LEAD IMPLANT DT: 20150515
MDC IDC LEAD LOCATION: 753860
MDC IDC LEAD MODEL: 365500
MDC IDC LEAD SERIAL: 10579264
MDC IDC MSMT LEADCHNL RV PACING THRESHOLD AMPLITUDE: 0.6 V
MDC IDC MSMT LEADCHNL RV PACING THRESHOLD PULSEWIDTH: 0.4 ms
MDC IDC PG IMPLANT DT: 20150515
MDC IDC PG SERIAL: 60800912
Pulse Gen Model: 383594

## 2018-09-11 NOTE — Progress Notes (Signed)
HPI [name] returns today for followup of his ICD and CAD. [name] returns today for followup s/p  middle aged man with known CAD, s/p MI, with an EF 30% despite maximal medical therapy after revascularization over 2 years ago.  He has class 2. CHF symptoms. In the interim, no ICD shocks. He remains active.  No Known Allergies   Current Outpatient Medications  Medication Sig Dispense Refill  . acetaminophen (TYLENOL) 325 MG tablet Take 2 tablets (650 mg total) by mouth every 4 (four) hours as needed for headache or mild pain.    Marland Kitchen allopurinol (ZYLOPRIM) 300 MG tablet Take 1 tablet (300 mg total) by mouth daily. Needs to schedule appointment 30 tablet 1  . aspirin EC 81 MG tablet Take 81 mg by mouth daily.    Marland Kitchen atorvastatin (LIPITOR) 80 MG tablet Take 1 tablet (80 mg total) by mouth daily. 30 tablet 6  . budesonide-formoterol (SYMBICORT) 160-4.5 MCG/ACT inhaler Inhale 2 puffs into the lungs 2 (two) times daily. 1 Inhaler 6  . carvedilol (COREG) 3.125 MG tablet TAKE 1 TABLET BY MOUTH TWICE DAILY WITH A MEAL 60 tablet 6  . furosemide (LASIX) 40 MG tablet TAKE 1 TABLET BY MOUTH ONCE DAILY 90 tablet 1  . isosorbide mononitrate (IMDUR) 30 MG 24 hr tablet TAKE 1 TABLET BY MOUTH ONCE DAILY 90 tablet 3  . lisinopril (PRINIVIL,ZESTRIL) 5 MG tablet Take 5 mg by mouth daily.    . nitroGLYCERIN (NITROSTAT) 0.4 MG SL tablet DISSOLVE ONE TABLET UNDER THE TONGUE EVERY 5 MINUTES AS NEEDED FOR CHEST PAIN.  DO NOT EXCEED A TOTAL OF 3 DOSES IN 15 MINUTES 75 tablet 1  . Omega-3 Fatty Acids (FISH OIL PO) Take 1 capsule by mouth daily.    . sildenafil (VIAGRA) 50 MG tablet Take 1 tablet (50 mg total) by mouth daily as needed for erectile dysfunction. 10 tablet 3  . Spacer/Aero-Holding Chambers (AEROCHAMBER MV) inhaler Use as instructed 1 each 0  . spironolactone (ALDACTONE) 25 MG tablet TAKE ONE TABLET BY MOUTH AT BEDTIME 30 tablet 8  . tiotropium (SPIRIVA) 18 MCG inhalation capsule Place 1  capsule (18 mcg total) into inhaler and inhale daily. 30 capsule 12   No current facility-administered medications for this visit.      Past Medical History:  Diagnosis Date  . Acute systolic CHF (congestive heart failure) ([place])   . AICD (automatic cardioverter/defibrillator) present   . Anxiety   . Ascending aortic aneurysm ([place]) 08/31/2017  . Back pain 08/31/2017  . Basal cell carcinoma    left arm  . CAD (coronary artery disease)    a. 2000: s/p stent of RCA 2000 with BMS  b. 2015: STEMI s/p LHC with old occlusion of RCA and DESx2 to LAD  c. 01/05/62: complicated PCI on 3/2 for CTO of mid RCA with coronary perforation and cardiac tamponade requiring emergent pericardiocentesis     . Cardiac tamponade    a. 02/03/15 2/2 coronary perforation during CTO procedure. Sealed with graftmaster coated stent.   Marland Kitchen COPD (chronic obstructive pulmonary disease) ([place]) 08/31/2017  . Gout   . History of pneumonia   . HTN (hypertension)   . Hyperlipidemia   . Ischemic cardiomyopathy    a. 2D ECHO: EF 25-30%. Akinesis of the anteroseptal and  . Left main coronary artery disease   . Myocardial infarction ([place])   . Obesity   . S/P CABG x 2 09/02/2015   LIMA to LAD, SVG  to ramus intermediate branch, EVH via right thigh   . Shortness of breath dyspnea   . Tobacco abuse   . Urinary frequency     ROS:   All systems reviewed and negative except as noted in the HPI.   Past Surgical History:  Procedure Laterality Date  . CARDIAC CATHETERIZATION    . CARDIAC CATHETERIZATION N/A 06/14/2015   Procedure: Left Heart Cath and Coronary Angiography;  Surgeon: Lorretta Harp, MD;  Location: [place] CV LAB;  Service: Cardiovascular;  Laterality: N/A;  . CARDIAC CATHETERIZATION N/A 08/18/2015   Procedure: Intravascular Pressure Wire/FFR Study;  Surgeon: Peter M Martinique, MD;  Location: [place] CV LAB;  Service: Cardiovascular;  Laterality: N/A;  . COLONOSCOPY    . CORONARY ANGIOPLASTY  2000  . CORONARY  ANGIOPLASTY WITH STENT PLACEMENT  2015  . CORONARY ARTERY BYPASS GRAFT N/A 09/02/2015   Procedure: CORONARY ARTERY BYPASS GRAFTING (CABG) x 2 (LIMA-LAD, SVG-Intermediate) ENDOSCOPIC GREATER SAPHENOUS VEIN HARVEST RIGHT THIGH;  Surgeon: Rexene Alberts, MD;  Location: [place];  Service: Open Heart Surgery;  Laterality: N/A;  . FOOT SURGERY    . IMPLANTABLE CARDIOVERTER DEFIBRILLATOR IMPLANT N/A 04/17/2014   Procedure: IMPLANTABLE CARDIOVERTER DEFIBRILLATOR IMPLANT;  Surgeon: Evans Lance, MD;  Location: [facility] CATH LAB;  Service: Cardiovascular;  Laterality: N/A;  . LEFT HEART CATH AND CORS/GRAFTS ANGIOGRAPHY N/A 08/30/2017   Procedure: LEFT HEART CATH AND CORS/GRAFTS ANGIOGRAPHY;  Surgeon: Nelva Bush, MD;  Location: [place] CV LAB;  Service: Cardiovascular;  Laterality: N/A;  . LEFT HEART CATHETERIZATION WITH CORONARY ANGIOGRAM N/A 12/11/2013   Procedure: LEFT HEART CATHETERIZATION WITH CORONARY ANGIOGRAM;  Surgeon: Peter M Martinique, MD;  Location: [facility] CATH LAB;  Service: Cardiovascular;  Laterality: N/A;  . LEFT HEART CATHETERIZATION WITH CORONARY ANGIOGRAM N/A 01/05/2015   Procedure: LEFT HEART CATHETERIZATION WITH CORONARY ANGIOGRAM;  Surgeon: Peter M Martinique, MD;  Location: [facility] CATH LAB;  Service: Cardiovascular;  Laterality: N/A;  . PERCUTANEOUS CORONARY STENT INTERVENTION (PCI-S)  12/11/2013   Procedure: PERCUTANEOUS CORONARY STENT INTERVENTION (PCI-S);  Surgeon: Peter M Martinique, MD;  Location: [facility] CATH LAB;  Service: Cardiovascular;;  prov LAD and mid LAD  . PERCUTANEOUS CORONARY STENT INTERVENTION (PCI-S) N/A 02/03/2015   Procedure: PERCUTANEOUS CORONARY STENT INTERVENTION (PCI-S);  Surgeon: Jettie Booze, MD;  Location: [facility] CATH LAB;  Service: Cardiovascular;  Laterality: N/A;  . TEE WITHOUT CARDIOVERSION N/A 09/02/2015   Procedure: TRANSESOPHAGEAL ECHOCARDIOGRAM (TEE);  Surgeon: Rexene Alberts, MD;  Location: [place];  Service: Open Heart Surgery;  Laterality: N/A;     Family History  Problem  Relation Age of Onset  . CAD Father        PTCA  . Cancer Mother        LYMPHOMA     Social History   Socioeconomic History  . Marital status: Married    Spouse name: Not on file  . Number of children: 2  . Years of education: Not on file  . Highest education level: Not on file  Occupational History  . 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 Financial resource strain: Not on file  . Food insecurity:    Worry: Not on file    Inability: Not on file  . Transportation needs:    Medical: Not on file    Non-medical: Not on file  Tobacco Use  . Smoking status: Current Every Day Smoker    Packs/day: 0.50    Years: 35.00    Pack years:  17.50    Types: Cigarettes  . Smokeless tobacco: Never Used  . Tobacco comment: pt states he is currently smoking 3-5 cigarettes daily 10/03/17  Substance and Sexual Activity  . Alcohol use: No    Alcohol/week: 0.0 standard drinks  . Drug use: No  . Sexual activity: Not Currently  Lifestyle  . Physical activity:    Days per week: Not on file    Minutes per session: Not on file  . Stress: Not on file  Relationships  . Social connections:    Talks on phone: Not on file    Gets together: Not on file    Attends religious service: Not on file    Active member of club or organization: Not on file    Attends meetings of clubs or organizations: Not on file    Relationship status: Not on file  . Intimate partner violence:    Fear of current or ex partner: Not on file    Emotionally abused: Not on file    Physically abused: Not on file    Forced sexual activity: Not on file  Other Topics Concern  . Not on file  Social History Narrative  . Not on file     BP 114/74   Pulse 80   Ht 6\' 1"  (1.854 m)   Wt 217 lb 12.8 oz (98.8 kg)   SpO2 95%   BMI 28.74 kg/m   Physical Exam:  Well appearing NAD HEENT: Unremarkable Neck:  No JVD, no thyromegally Lymphatics:  No adenopathy Back:  No CVA tenderness Lungs:  Clear HEART:   Regular rate rhythm, no murmurs, no rubs, no clicks Abd:  soft, positive bowel sounds, no organomegally, no rebound, no guarding Ext:  2 plus pulses, no edema, no cyanosis, no clubbing Skin:  No rashes no nodules Neuro:  CN II through XII intact, motor grossly intact   DEVICE  Normal device function.  See PaceArt for details.   Assess/Plan: 1. ICD - his Biotronik VDD device is working normally. We will recheck in several months. 2. Chronic systolic heart failure - his symptoms remain class 2. No change in meds. 3. Dyslipidemia - he will continue his statin therapy and maintain a low sodium diet.  Mikle [email or web address].D.

## 2018-09-11 NOTE — Patient Instructions (Signed)
Medication Instructions:  Your physician recommends that you continue on your current medications as directed. Please refer to the Current Medication list given to you today.  Labwork: None ordered.  Testing/Procedures: None ordered.  Follow-Up: Your physician wants you to follow-up in: one year with Dr. [name].   You will receive a reminder letter in the mail two months in advance. If you don't receive a letter, please call our office to schedule the follow-up appointment.  Remote monitoring is used to monitor your ICD from home. This monitoring reduces the number of office visits required to check your device to one time per year. It allows [place] to keep an eye on the functioning of your device to ensure it is working properly. You are scheduled for a device check from home on 10/09/2018. You may send your transmission at any time that day. If you have a wireless device, the transmission will be sent automatically. After your physician reviews your transmission, you will receive a postcard with your next transmission date.  Any Other Special Instructions Will Be Listed Below (If Applicable).  If you need a refill on your cardiac medications before your next appointment, please call your pharmacy.

## 2018-09-13 ENCOUNTER — Telehealth: Payer: Self-pay | Admitting: Internal Medicine

## 2018-09-13 NOTE — Telephone Encounter (Signed)
Returned call to Pt.    Advised may take a week or 2 for [facility] to contact Pt to schedule appt.  Advised if Pt has not heard from urologist in 2 weeks call this nurse for follow up.

## 2018-09-13 NOTE — Telephone Encounter (Signed)
New message   Patient is calling to followup about referral for Urology. Please call to discuss.

## 2018-10-09 ENCOUNTER — Ambulatory Visit (INDEPENDENT_AMBULATORY_CARE_PROVIDER_SITE_OTHER): Payer: Medicaid Other | Admitting: *Deleted

## 2018-10-09 ENCOUNTER — Other Ambulatory Visit: Payer: Self-pay | Admitting: *Deleted

## 2018-10-09 DIAGNOSIS — I255 Ischemic cardiomyopathy: Secondary | ICD-10-CM

## 2018-10-09 DIAGNOSIS — I5022 Chronic systolic (congestive) heart failure: Secondary | ICD-10-CM

## 2018-10-09 MED ORDER — LISINOPRIL 5 MG PO TABS: 5.0000 mg | ORAL_TABLET | Freq: Every day | ORAL | 1 refills | Status: DC

## 2018-10-09 NOTE — Progress Notes (Signed)
Remote ICD transmission.   

## 2018-10-13 ENCOUNTER — Encounter: Payer: Self-pay | Admitting: Cardiology

## 2018-10-30 ENCOUNTER — Other Ambulatory Visit: Payer: Self-pay | Admitting: Cardiology

## 2018-12-14 LAB — CUP PACEART REMOTE DEVICE CHECK
Implantable Lead Implant Date: 20150515
Implantable Lead Model: 365500
Implantable Lead Serial Number: 10579264
Implantable Pulse Generator Implant Date: 20150515
MDC IDC LEAD LOCATION: 753860
MDC IDC PG SERIAL: 60800912
MDC IDC SESS DTM: 20200111103111

## 2019-01-08 ENCOUNTER — Ambulatory Visit (INDEPENDENT_AMBULATORY_CARE_PROVIDER_SITE_OTHER): Payer: Medicaid Other

## 2019-01-08 DIAGNOSIS — I255 Ischemic cardiomyopathy: Secondary | ICD-10-CM

## 2019-01-08 DIAGNOSIS — I5022 Chronic systolic (congestive) heart failure: Secondary | ICD-10-CM | POA: Diagnosis not present

## 2019-01-10 LAB — CUP PACEART REMOTE DEVICE CHECK
Date Time Interrogation Session: 20200207114430
Implantable Lead Location: 753860
MDC IDC LEAD IMPLANT DT: 20150515
MDC IDC LEAD MODEL: 365500
MDC IDC LEAD SERIAL: 10579264
MDC IDC PG IMPLANT DT: 20150515
Pulse Gen Model: 383594
Pulse Gen Serial Number: 60800912

## 2019-01-17 NOTE — Progress Notes (Signed)
Remote ICD transmission.   

## 2019-03-05 ENCOUNTER — Other Ambulatory Visit: Payer: Self-pay

## 2019-03-05 MED ORDER — LISINOPRIL 5 MG PO TABS: 5.0000 mg | ORAL_TABLET | Freq: Every day | ORAL | 3 refills | Status: DC

## 2019-03-05 MED ORDER — CARVEDILOL 3.125 MG PO TABS: ORAL_TABLET | ORAL | 3 refills | Status: DC

## 2019-03-05 MED ORDER — ALLOPURINOL 300 MG PO TABS: 300.0000 mg | ORAL_TABLET | Freq: Every day | ORAL | 3 refills | Status: DC

## 2019-03-05 MED ORDER — ATORVASTATIN CALCIUM 80 MG PO TABS: 80.0000 mg | ORAL_TABLET | Freq: Every day | ORAL | 3 refills | Status: DC

## 2019-03-05 MED ORDER — SPIRONOLACTONE 25 MG PO TABS: 25.0000 mg | ORAL_TABLET | Freq: Every day | ORAL | 3 refills | Status: DC

## 2019-03-05 MED ORDER — FUROSEMIDE 40 MG PO TABS: 40.0000 mg | ORAL_TABLET | Freq: Every day | ORAL | 3 refills | Status: DC

## 2019-03-05 MED ORDER — ISOSORBIDE MONONITRATE ER 30 MG PO TB24: 30.0000 mg | ORAL_TABLET | Freq: Every day | ORAL | 3 refills | Status: DC

## 2019-03-05 NOTE — Progress Notes (Signed)
Spoke to patient he stated he changed pharmacies.He requested 90 day refills on all medications sent to [facility].Refills sent to pharmacy.

## 2019-04-16 ENCOUNTER — Ambulatory Visit (INDEPENDENT_AMBULATORY_CARE_PROVIDER_SITE_OTHER): Payer: Medicaid Other | Admitting: *Deleted

## 2019-04-16 ENCOUNTER — Other Ambulatory Visit: Payer: Self-pay

## 2019-04-16 DIAGNOSIS — I5022 Chronic systolic (congestive) heart failure: Secondary | ICD-10-CM

## 2019-04-16 DIAGNOSIS — I255 Ischemic cardiomyopathy: Secondary | ICD-10-CM

## 2019-04-20 LAB — CUP PACEART REMOTE DEVICE CHECK
Date Time Interrogation Session: 20200517182217
Implantable Lead Implant Date: 20150515
Implantable Lead Location: 753860
Implantable Lead Model: 365500
Implantable Lead Serial Number: 10579264
Implantable Pulse Generator Implant Date: 20150515
Pulse Gen Model: 383594
Pulse Gen Serial Number: 60800912

## 2019-05-06 NOTE — Progress Notes (Signed)
Remote ICD transmission.   

## 2019-05-11 IMAGING — CT CT ANGIO CHEST
2 of 7 series · 18 of 46 positions shown · IV contrast (iopamidol)
Comparison: Prior CT scan of the chest 08/31/2017

CLINICAL DATA: 62-year-old male with a history of thoracic aortic
aneurysm

EXAM:
CT ANGIOGRAPHY CHEST WITH CONTRAST
TECHNIQUE: Multidetector CT imaging of the chest was performed using the
standard protocol during bolus administration of intravenous
contrast. Multiplanar CT image reconstructions and MIPs were
obtained to evaluate the vascular anatomy.
CONTRAST:  100mL [id number]-188 IOPAMIDOL ([id number]-[id number]) INJECTION 61%

[Series 4: aorta 3.0 i31f 2 · axial · 0.81mm/px · z∈[-363,-36]mm · 15 of 119 slices shown]
[im 5/119  lung]
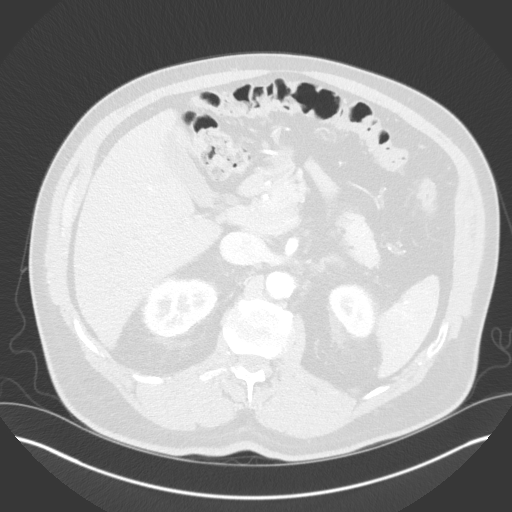
[im 14/119  soft-tissue]
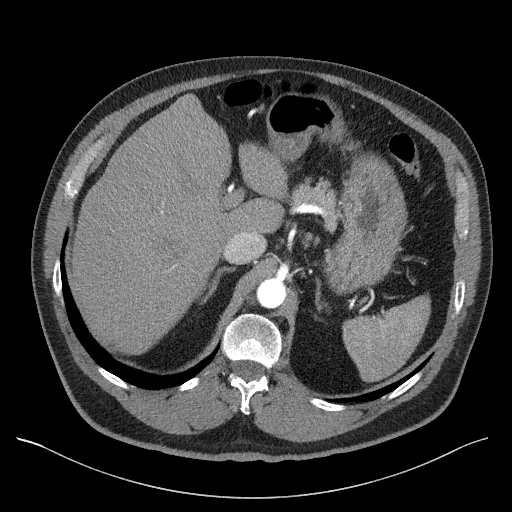
[im 23/119  lung]
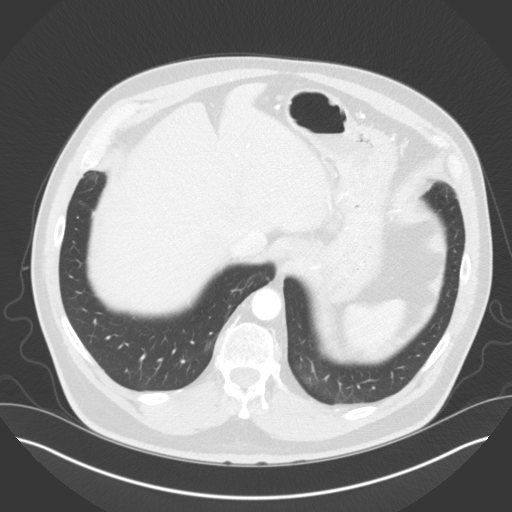
[im 28/119  soft-tissue]
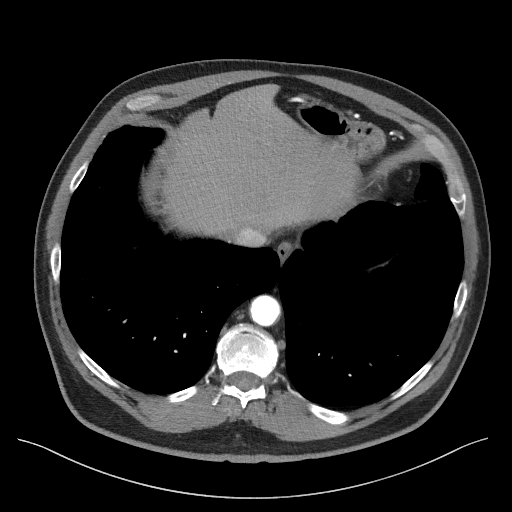
[im 37/119  lung]
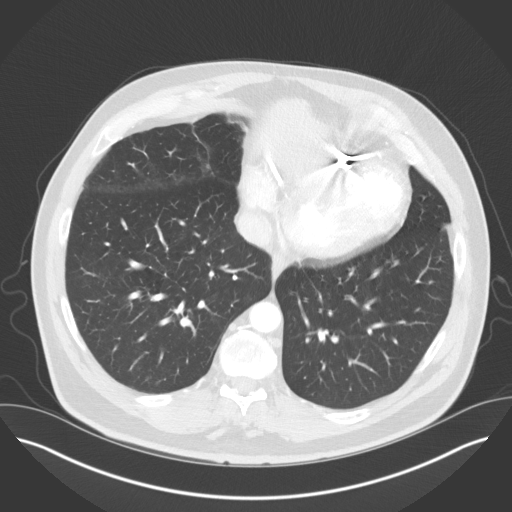
[im 46/119  soft-tissue]
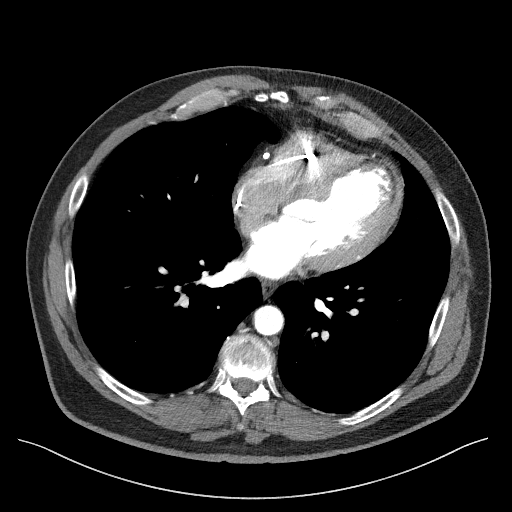
[im 50/119  lung]
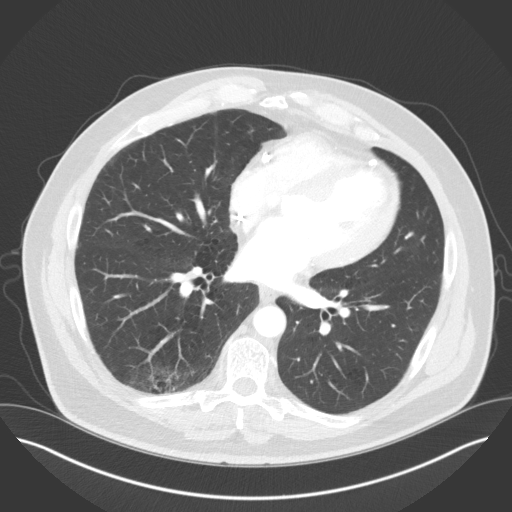
[im 60/119  soft-tissue]
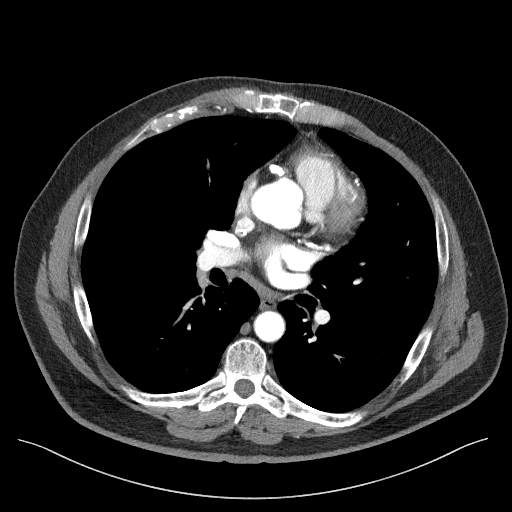
[im 69/119  lung]
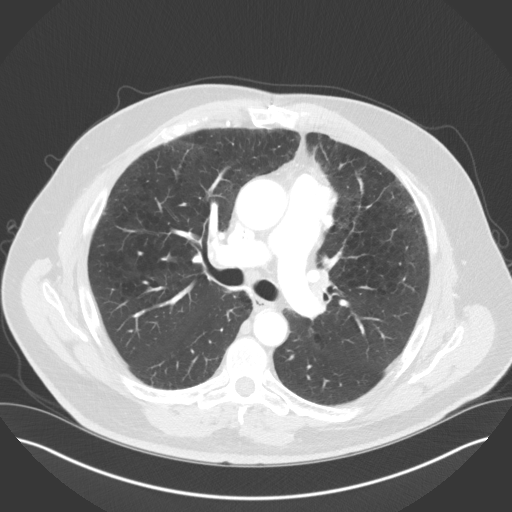
[im 73/119  soft-tissue]
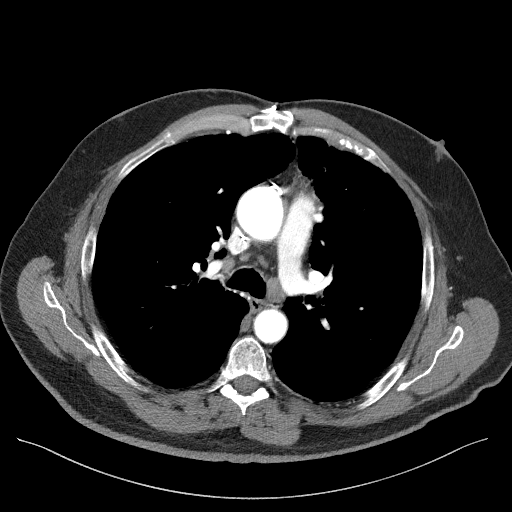
[im 82/119  lung]
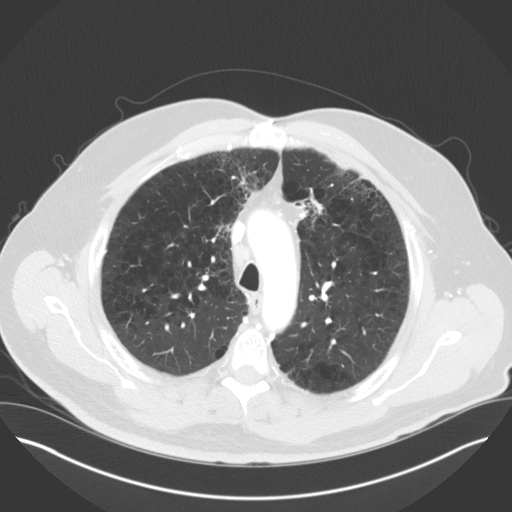
[im 91/119  soft-tissue]
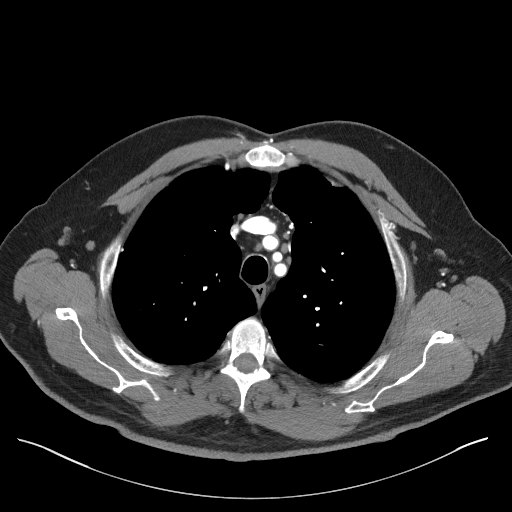
[im 96/119  lung]
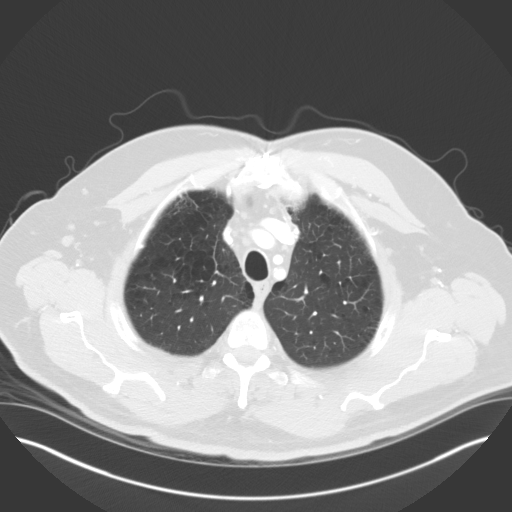
[im 105/119  soft-tissue]
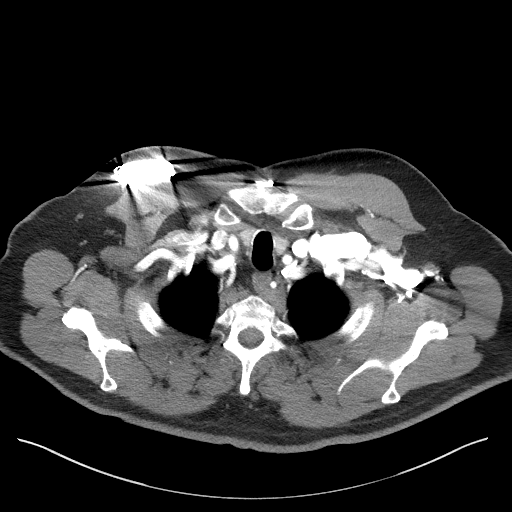
[im 114/119  lung]
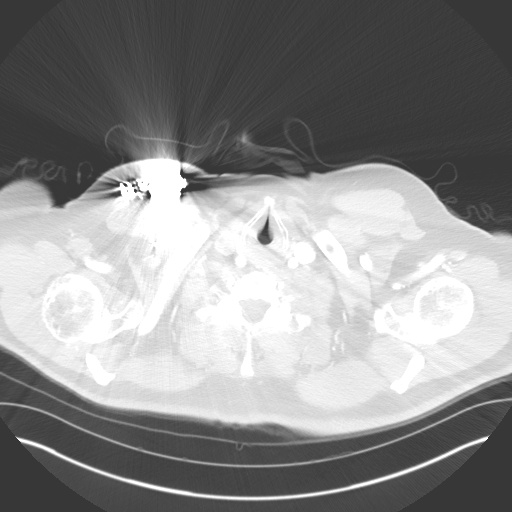

[Series 7: coronals · coronal · 0.73mm/px · 3 of 167 slices shown]
[im 42/167  soft-tissue]
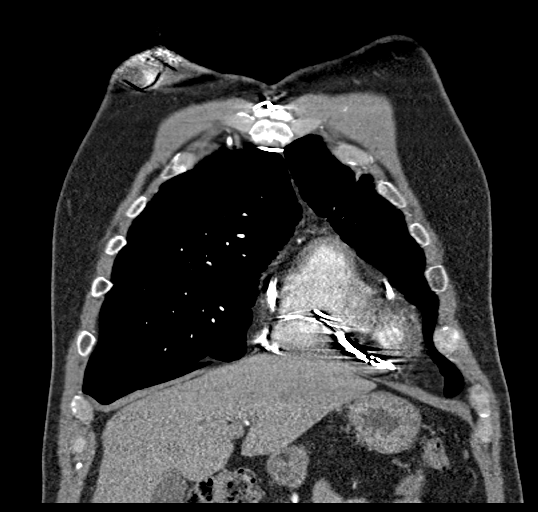
[im 84/167  soft-tissue]
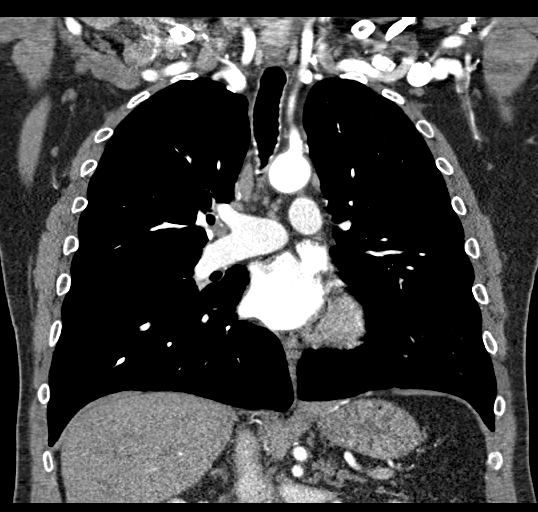
[im 125/167  soft-tissue]
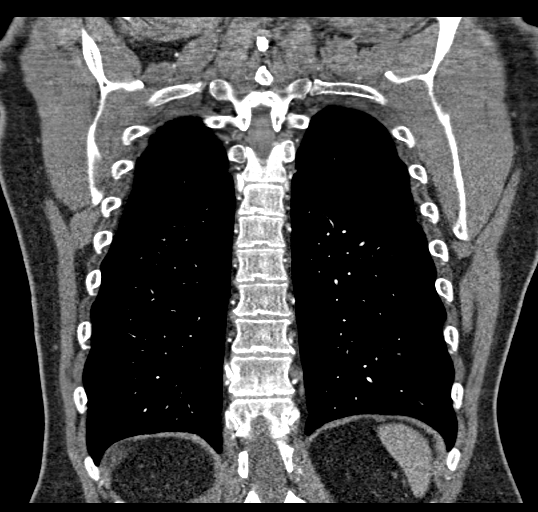

[18 of 46 positions shown; findings below may reference images not displayed]

FINDINGS: Cardiovascular: Adequate opacification of the thoracic aorta and
branch arteries. Conventional 3 vessel arch anatomy. Mild scattered
heterogeneous atherosclerotic calcifications. The aortic root is
within normal limits at 4.1 cm. Mild ectasia of the ascending
thoracic aorta with a maximal diameter of 3.9 cm. Extensive coronary
artery disease status post percutaneous coronary intervention and
coronary artery bypass grafting. The bypass graft is patent. The
heart is normal in size. Right subclavian approach cardiac rhythm
maintenance device. No pericardial effusion. Stable mild enlargement
of the main pulmonary artery at 3.2 cm.

Mediastinum/Nodes: Unremarkable CT appearance of the thyroid gland.
No suspicious mediastinal or hilar adenopathy. No soft tissue
mediastinal mass. The thoracic esophagus is unremarkable.

Lungs/Pleura: Moderate to advanced combined centrilobular and
paraseptal pulmonary emphysema. Stable small right lower lobe
pulmonary nodule. No suspicious pulmonary nodule, mass or acute
abnormality.

Upper Abdomen: Visualized upper abdominal organs are unremarkable.

Musculoskeletal: Stable small right upper pole simple renal cyst.

Review of the MIP images confirms the above findings.
IMPRESSION: 1. Mild ectasia of the ascending thoracic aorta with a maximal
diameter of 3.9 cm which may be within normal limits for this
individual. The previous measurement of 4.1 cm may have been
slightly exaggerated by motion related artifact.
2.  Emphysema. (NPUM4-R3H.K)
3. Coronary artery disease.
4.  Aortic Atherosclerosis (NPUM4-170.0)
5. Stable tiny pulmonary nodules.  No further follow-up required.

## 2019-06-01 NOTE — Progress Notes (Deleted)
Cardiology Office Note    Date:  06/01/2019   ID:  Kerry Mason, DOB Oct 09, 1956, MRN [id number]  PCP:  Velna Hatchet, MD  Cardiologist:  Dr. Martinique   No chief complaint on file.   History of Present Illness:  Kerry Mason is a 63 y.o. male with CAD s/p CABG and multiple PCI, chronic systolic HF s/p ICD, HLD and tobacco abuse. He had a remote stenting of RCA in 2000 and was lost to follow-up. He ended up having anterior STEMI in January 2015, cardiac catheterization showed occluded proximal RCA with collaterals and the occlusion of the proximal LAD RCA occlusion was felt to be chronic, he was treated with drug-eluting stent to the occluded LAD. EF by cath was 35%. He subsequently required ICD placement. He had Myoview in December 2015 that was high risk with large area of anterior, septal, apical scar and inferior apical ischemia, EF was noted to be 25%. He underwent repeat cardiac cath that showed patent stents in the LAD, but first diagonal had 80% stenosis, chronically occluded RCA with collaterals. Due to his anginal symptom, he underwent a CTO of RCA, procedure was complicated by perforation of mid RCA which resulted in cardiac tamponade and required emergent pericardiocentesis and removal of 1L of blood. Perforation was sealed with Graftmaster and covered stent to the mid RCA. Remainder of his RCA was stented with 4 additional drug-eluting stents. He ended up having pericarditis afterward. He did develop brief PAF during the period of pericarditis.   He was admitted in July 2016 with NSTEMI. Cardiac catheterization showed reocclusion of mid RCA with left-to-right collaterals, he was treated medically with addition of Imdur. He also had a 50% stenosis in the left main, FFR was abnormal, he was referred for CT surgery. He underwent CABG by Dr. Roxy Manns in 9/16 with LIMA to LAD, SVG to ramus, RCA was not bypassed due to small target and significant scarring from the prior pericarditis.   He  was admitted to the hospital on 08/29/2017 with chest pain. He eventually underwent cardiac catheterization on 08/30/2017 with Dr. Saunders Revel which showed widely patent LIMA to LAD and SVG to ramus, moderate to severely reduced left ventricular contraction, multiple abnormal vessels arising from the LIMA graft, mediastinal mass is a possibility. Otherwise continue medical therapy and a secondary prevention of CAD. CT of chest showed moderate to severe upper lobe central lobar emphysema, 4.1 cm ascending aortic aneurysm, left lower paratracheal lymphadenopathy likely reactive, several scattered 2-3 mm pulmonary nodule. He was referred to pulmonology service for COPD evaluation. Repeat CT in October 2019 showed no change in aorta.   ICD check on 04/16/19 showed normal device function without significant arrhythmia.    On follow up today he is doing very well. Notes chronic mild  SOB when walking up hill but this is unchanged. Walks daily unless the weather is too hot.  He denies any chest pain or palpitations. He still smokes occasionally. He does note some rib pain bilaterally when lying down at night. Wants to try Viagra for ED.    Past Medical History:  Diagnosis Date  . Acute systolic CHF (congestive heart failure) ([place])   . AICD (automatic cardioverter/defibrillator) present   . Anxiety   . Ascending aortic aneurysm ([place]) 08/31/2017  . Back pain 08/31/2017  . Basal cell carcinoma    left arm  . CAD (coronary artery disease)    a. 2000: s/p stent of RCA 2000 with BMS  b. 2015: STEMI s/p LHC with  old occlusion of RCA and DESx2 to LAD  c. 01/06/54: complicated PCI on 3/2 for CTO of mid RCA with coronary perforation and cardiac tamponade requiring emergent pericardiocentesis     . Cardiac tamponade    a. 02/03/15 2/2 coronary perforation during CTO procedure. Sealed with graftmaster coated stent.   Marland Kitchen COPD (chronic obstructive pulmonary disease) ([place]) 08/31/2017  . Gout   . History of pneumonia   . HTN  (hypertension)   . Hyperlipidemia   . Ischemic cardiomyopathy    a. 2D ECHO: EF 25-30%. Akinesis of the anteroseptal and  . Left main coronary artery disease   . Myocardial infarction ([place])   . Obesity   . 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 Shortness of breath dyspnea   . Tobacco abuse   . Urinary frequency     Past Surgical History:  Procedure Laterality Date  . CARDIAC CATHETERIZATION    . CARDIAC CATHETERIZATION N/A 06/14/2015   Procedure: Left Heart Cath and Coronary Angiography;  Surgeon: Lorretta Harp, MD;  Location: [place] [facility];  Service: Cardiovascular;  Laterality: N/A;  . CARDIAC CATHETERIZATION N/A 08/18/2015   Procedure: Intravascular Pressure Wire/FFR Study;  Surgeon: Peter M Martinique, MD;  Location: [place] [facility];  Service: Cardiovascular;  Laterality: N/A;  . COLONOSCOPY    . CORONARY ANGIOPLASTY  2000  . CORONARY ANGIOPLASTY WITH STENT PLACEMENT  2015  . CORONARY ARTERY BYPASS GRAFT N/A 09/02/2015   Procedure: CORONARY ARTERY BYPASS GRAFTING (CABG) x 2 (LIMA-LAD, SVG-Intermediate) ENDOSCOPIC GREATER SAPHENOUS VEIN HARVEST RIGHT THIGH;  Surgeon: Rexene Alberts, MD;  Location: [place];  Service: Open Heart Surgery;  Laterality: N/A;  . FOOT SURGERY    . IMPLANTABLE CARDIOVERTER DEFIBRILLATOR IMPLANT N/A 04/17/2014   Procedure: IMPLANTABLE CARDIOVERTER DEFIBRILLATOR IMPLANT;  Surgeon: Evans Lance, MD;  Location: [facility] C[facility] LAB;  Service: Cardiovascular;  Laterality: N/A;  . LEFT HEART CATH AND CORS/GRAFTS ANGIOGRAPHY N/A 08/30/2017   Procedure: LEFT HEART CATH AND CORS/GRAFTS ANGIOGRAPHY;  Surgeon: Nelva Bush, MD;  Location: [place] [facility];  Service: Cardiovascular;  Laterality: N/A;  . LEFT HEART CATHETERIZATION WITH CORONARY ANGIOGRAM N/A 12/11/2013   Procedure: LEFT HEART CATHETERIZATION WITH CORONARY ANGIOGRAM;  Surgeon: Peter M Martinique, MD;  Location: [facility] CATH LAB;  Service: Cardiovascular;   Laterality: N/A;  . LEFT HEART CATHETERIZATION WITH CORONARY ANGIOGRAM N/A 01/05/2015   Procedure: LEFT HEART CATHETERIZATION WITH CORONARY ANGIOGRAM;  Surgeon: Peter M Martinique, MD;  Location[facility] CATH LAB;  Service: Cardiovascular;  Laterality: N/A;  . PERCUTANEOUS CORONARY STENT INTERVENTION (PCI-S)  12/11/2013   Procedure: PERCUTANEOUS CORONARY STENT INTERVENTION (PCI-S);  Surgeon: Peter M Martinique, MD;  Location: [facility] CATH LAB;  Service: Cardiovascular;;  prov LAD and mid LAD  . PERCUTANEOUS CORONARY STENT INTERVENTION (PCI-S) N/A 02/03/2015   Procedure: PERCUTANEOUS CORONARY STENT INTERVENTION (PCI-S);  Surgeon: Jettie Booze, MD;  Location: [facility] CATH LAB;  Service: Cardiovascular;  Laterality: N/A;  . TEE WITHOUT CARDIOVERSION N/A 09/02/2015   Procedure: TRANSESOPHAGEAL ECHOCARDIOGRAM (TEE);  Surgeon: Rexene Alberts, MD;  Location: [place];  Service: Open Heart Surgery;  Laterality: N/A;    Current Medications: Outpatient Medications Prior to Visit  Medication Sig Dispense Refill  . acetaminophen (TYLENOL) 325 MG tablet Take 2 tablets (650 mg total) by mouth every 4 (four) hours as needed for headache or mild pain.    Marland Kitchen allopurinol (ZYLOPRIM) 300 MG tablet  Take 1 tablet (300 mg total) by mouth daily. 90 tablet 3  . aspirin EC 81 MG tablet Take 81 mg by mouth daily.    Marland Kitchen atorvastatin (LIPITOR) 80 MG tablet Take 1 tablet (80 mg total) by mouth daily. 90 tablet 3  . budesonide-formoterol (SYMBICORT) 160-4.5 MCG/ACT inhaler Inhale 2 puffs into the lungs 2 (two) times daily. 1 Inhaler 6  . carvedilol (COREG) 3.125 MG tablet TAKE 1 TABLET BY MOUTH TWICE DAILY WITH A MEAL 180 tablet 3  . furosemide (LASIX) 40 MG tablet Take 1 tablet (40 mg total) by mouth daily. 90 tablet 3  . isosorbide mononitrate (IMDUR) 30 MG 24 hr tablet Take 1 tablet (30 mg total) by mouth daily. 90 tablet 3  . lisinopril (PRINIVIL,ZESTRIL) 5 MG tablet Take 1 tablet (5 mg total) by mouth daily. 90 tablet 3  . nitroGLYCERIN  (NITROSTAT) 0.4 MG SL tablet DISSOLVE ONE TABLET UNDER THE TONGUE EVERY 5 MINUTES AS NEEDED FOR CHEST PAIN.  DO NOT EXCEED A TOTAL OF 3 DOSES IN 15 MINUTES 75 tablet 1  . Omega-3 Fatty Acids (FISH OIL PO) Take 1 capsule by mouth daily.    . sildenafil (VIAGRA) 50 MG tablet Take 1 tablet (50 mg total) by mouth daily as needed for erectile dysfunction. 10 tablet 3  . Spacer/Aero-Holding Chambers (AEROCHAMBER MV) inhaler Use as instructed 1 each 0  . spironolactone (ALDACTONE) 25 MG tablet Take 1 tablet (25 mg total) by mouth at bedtime. 90 tablet 3  . tiotropium (SPIRIVA) 18 MCG inhalation capsule Place 1 capsule (18 mcg total) into inhaler and inhale daily. 30 capsule 12   No facility-administered medications prior to visit.      Allergies:   Patient has no known allergies.   Social History   Socioeconomic History  . Marital status: Married    Spouse name: Not on file  . Number of children: 2  . Years of education: Not on file  . Highest education level: Not on file  Occupational History  . 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 Financial resource strain: Not on file  . Food insecurity    Worry: Not on file    Inability: Not on file  . Transportation needs    Medical: Not on file    Non-medical: Not on file  Tobacco Use  . Smoking status: Current Every Day Smoker    Packs/day: 0.50    Years: 35.00    Pack years: 17.50    Types: Cigarettes  . Smokeless tobacco: Never Used  . Tobacco comment: pt states he is currently smoking 3-5 cigarettes daily 10/03/17  Substance and Sexual Activity  . Alcohol use: No    Alcohol/week: 0.0 standard drinks  . Drug use: No  . Sexual activity: Not Currently  Lifestyle  . Physical activity    Days per week: Not on file    Minutes per session: Not on file  . Stress: Not on file  Relationships  . Social Herbalist on phone: Not on file    Gets together: Not on file    Attends religious service: Not on file     Active member of club or organization: Not on file    Attends meetings of clubs or organizations: Not on file    Relationship status: Not on file  Other Topics Concern  . Not on file  Social History Narrative  . Not on file     Family History:  The  patient's family history includes CAD in his father; Cancer in his mother.   ROS:   Please see the history of present illness.    ROS All other systems reviewed and are negative.   PHYSICAL EXAM:   VS:  There were no vitals taken for this visit.   GENERAL:  Well appearing WM in NAD HEENT:  PERRL, EOMI, sclera are clear. Oropharynx is clear. NECK:  No jugular venous distention, carotid upstroke brisk and symmetric, no bruits, no thyromegaly or adenopathy LUNGS:  Clear to auscultation bilaterally CHEST:  Unremarkable HEART:  RRR,  PMI not displaced or sustained,S1 and S2 within normal limits, no S3, no S4: no clicks, no rubs, no murmurs ABD:  Soft, nontender. BS +, no masses or bruits. No hepatomegaly, no splenomegaly EXT:  2 + pulses throughout, no edema, no cyanosis no clubbing SKIN:  Warm and dry.  No rashes NEURO:  Alert and oriented x 3. Cranial nerves II through XII intact. PSYCH:  Cognitively intact    Wt Readings from Last 3 Encounters:  09/11/18 217 lb 12.8 oz (98.8 kg)  08/21/18 213 lb 12.8 oz (97 kg)  10/03/17 214 lb (97.1 kg)      Studies/Labs Reviewed:   EKG:  EKG is ordered today.  NSR with old anterior infarct. I have personally reviewed and interpreted this study.   Recent Labs: 08/21/2018: ALT 15; BUN 23; Creatinine, Ser 1.04; Hemoglobin 15.9; Platelets 283; Potassium 4.9; Sodium 137; TSH 1.220   Lipid Panel    Component Value Date/Time   CHOL 148 08/21/2018 1130   TRIG 108 08/21/2018 1130   HDL 35 (L) 08/21/2018 1130   CHOLHDL 4.2 08/21/2018 1130   CHOLHDL 3.4 12/24/2015 0909   VLDL 16 12/24/2015 0909   LDLCALC 91 08/21/2018 1130    Additional studies/ records that were reviewed today include:    Pulmonary tests PFT 10/03/2017>> FEV1 2.57 (65%) TLC, 6.93 (91%), DLCO 60% PFT 08/31/15 >> FEV1 3.04 (75%), FEV1% 67, TLC 7.98 (104%), DLCO 61% CT chest 08/31/17 >> severe CAD, TAA 4.1 cm, mod/severe centrilobular emphysema, multiple nodules 2-3 mm CT chest 09/10/18: IMPRESSION: 1. Mild ectasia of the ascending thoracic aorta with a maximal diameter of 3.9 cm which may be within normal limits for this individual. The previous measurement of 4.1 cm may have been slightly exaggerated by motion related artifact. 2.  Emphysema. (ICD10-J43.9) 3. Coronary artery disease. 4.  Aortic Atherosclerosis (ICD10-170.0) 5. Stable tiny pulmonary nodules.  No further follow-up required.  Cath 08/30/2017 FINDINGS: 1. Stable native coronary artery disease with 50% distal LMCA and chronic total occlusion of ostial RCA. Distal RCA fills via left-to-right collaterals. 2. Widely patent LIMA to LAD and SVG to ramus. 3. Multiple abnormal vessels arising from the LIMA graft. Mediastinal mass is a consideration. 4. Normal left ventricular filling pressure. 5. Moderate to severely reduced left ventricular contraction.  RECOMMENDATIONS: 1. Recommend CT chest to evaluate for intrathoracic mass. 2. Continue medical therapy and secondary prevention of CAD.    ASSESSMENT:    No diagnosis found.   PLAN:   1. CAD s/p CABG: last cardiac cath in 2018.  No  anginal symptoms currently. Continue medical therapy.  2. Tobacco abuse: reinforced importance of complete smoking cessation.  3. Hyperlipidemia: Continue Lipitor 40 mg daily, will check fasting labs today.  4. Chronic systolic heart failure s/p ICD: appears well compensated today.continue medical therapy.  5. Ascending aortic aneurysm: Measuring 4.1 cm, unchanged in October 2019.   6. Pulmonary nodules: as  per pulmonary.    Medication Adjustments/Labs and Tests Ordered: Current medicines are reviewed at length with the patient today.  Concerns  regarding medicines are outlined above.  Medication changes, Labs and Tests ordered today are listed in the Patient Instructions below. There are no Patient Instructions on file for this visit.   Signed, Peter Martinique, MD  06/01/2019 9:24 AM    [facility] [address], [place], [place]  [address] Phone: [phone]; Fax: [phone]

## 2019-06-02 ENCOUNTER — Telehealth: Payer: Self-pay | Admitting: Cardiology

## 2019-06-02 NOTE — Telephone Encounter (Signed)
LVM , reminding him of his appt on 06-03-19 with Dr Martinique.

## 2019-06-03 ENCOUNTER — Ambulatory Visit: Payer: Medicaid Other | Admitting: Cardiology

## 2019-07-17 ENCOUNTER — Ambulatory Visit (INDEPENDENT_AMBULATORY_CARE_PROVIDER_SITE_OTHER): Payer: Medicaid Other | Admitting: *Deleted

## 2019-07-17 DIAGNOSIS — I255 Ischemic cardiomyopathy: Secondary | ICD-10-CM | POA: Diagnosis not present

## 2019-07-18 LAB — CUP PACEART REMOTE DEVICE CHECK
Date Time Interrogation Session: 20200814113548
Implantable Lead Implant Date: 20150515
Implantable Lead Location: 753860
Implantable Lead Model: 365500
Implantable Lead Serial Number: 10579264
Implantable Pulse Generator Implant Date: 20150515
Pulse Gen Model: 383594
Pulse Gen Serial Number: 60800912

## 2019-07-28 ENCOUNTER — Encounter: Payer: Self-pay | Admitting: Cardiology

## 2019-07-28 NOTE — Progress Notes (Signed)
Remote ICD transmission.   

## 2019-08-08 ENCOUNTER — Telehealth: Payer: Self-pay | Admitting: Emergency Medicine

## 2019-08-08 NOTE — Telephone Encounter (Signed)
Received [employer]otron[employer] alert for PVC's > 50 PVC/hr. Patient reports he has missed a few doses of Coreg and has been outside during the hottest part of the day. Denies SOB and CP. Encouraged to take medication regularly as directed and to avoid outside activity during hottest time of day. ED precautions given.

## 2019-10-13 ENCOUNTER — Telehealth: Payer: Self-pay | Admitting: Emergency Medicine

## 2019-10-13 NOTE — Telephone Encounter (Signed)
Patient had Biotron[employer] alert for increase PVC's. No change in patient condition. Reports he does feel his "heart skip" occasionally but no more than in the past. Overdue for 1 year follow up with Dr [name] Will send to scheduler to get him in with Dr [name].

## 2019-10-16 ENCOUNTER — Ambulatory Visit (INDEPENDENT_AMBULATORY_CARE_PROVIDER_SITE_OTHER): Payer: Medicaid Other | Admitting: *Deleted

## 2019-10-16 DIAGNOSIS — I255 Ischemic cardiomyopathy: Secondary | ICD-10-CM

## 2019-10-16 DIAGNOSIS — I5021 Acute systolic (congestive) heart failure: Secondary | ICD-10-CM

## 2019-10-18 LAB — CUP PACEART REMOTE DEVICE CHECK
Date Time Interrogation Session: 20201114071230
Implantable Lead Implant Date: 20150515
Implantable Lead Location: 753860
Implantable Lead Model: 365500
Implantable Lead Serial Number: 10579264
Implantable Pulse Generator Implant Date: 20150515
Pulse Gen Model: 383594
Pulse Gen Serial Number: 60800912

## 2019-11-07 NOTE — Progress Notes (Signed)
Remote ICD transmission.   

## 2020-01-01 ENCOUNTER — Ambulatory Visit: Payer: Medicaid Other | Attending: Internal Medicine

## 2020-01-01 DIAGNOSIS — Z20822 Contact with and (suspected) exposure to covid-19: Secondary | ICD-10-CM

## 2020-01-02 LAB — NOVEL CORONAVIRUS, NAA: SARS-CoV-2, NAA: NOT DETECTED

## 2020-01-15 ENCOUNTER — Ambulatory Visit (INDEPENDENT_AMBULATORY_CARE_PROVIDER_SITE_OTHER): Payer: Medicaid Other | Admitting: *Deleted

## 2020-01-15 DIAGNOSIS — I255 Ischemic cardiomyopathy: Secondary | ICD-10-CM

## 2020-01-15 LAB — CUP PACEART REMOTE DEVICE CHECK
Battery Voltage: 3.11 V
Brady Statistic RV Percent Paced: 0 %
Date Time Interrogation Session: 20210211074916
HighPow Impedance: 91 Ohm
Implantable Lead Implant Date: 20150515
Implantable Lead Location: 753860
Implantable Lead Model: 365
Implantable Lead Serial Number: 10579264
Implantable Pulse Generator Implant Date: 20150515
Lead Channel Impedance Value: 579 Ohm
Lead Channel Sensing Intrinsic Amplitude: 20 mV
Lead Channel Sensing Intrinsic Amplitude: 8 mV
Lead Channel Setting Pacing Amplitude: 2.5 V
Lead Channel Setting Pacing Pulse Width: 0.4 ms
Lead Channel Setting Sensing Sensitivity: 0.8 mV
Pulse Gen Model: 383594
Pulse Gen Serial Number: 60800912

## 2020-01-15 NOTE — Progress Notes (Signed)
ICD Remote  

## 2020-02-17 ENCOUNTER — Telehealth: Payer: Self-pay | Admitting: Cardiology

## 2020-02-17 ENCOUNTER — Encounter: Payer: Medicaid Other | Admitting: Internal Medicine

## 2020-02-17 NOTE — Telephone Encounter (Signed)
Pt has been exposed to [place] by his brother that lives in his home.. he does not have any COVID symptoms but set himself up for a COVID test tomorrow but he has also recently had the first COVID vaccine. He is concerned the vaccine will "make the test positive".. I advised him there is not a sure way of knowing.. he could mention to the nurse that does his test but if it positive he will still need to quarantine for optimal safety. He should be tested if he develops symptoms but may need to talk with his PCP when he thinks the right time to test is. Pt agreed.

## 2020-02-17 NOTE — Telephone Encounter (Signed)
New message     Pt is calling and says he has been  exposed to covid. He says he has had one shot of the vaccine and is due on the 30th for the second dose  He says he has a covid test tomorrow and is wondering if having the first dose will effect the results of the covid test     Please call back

## 2020-02-18 ENCOUNTER — Ambulatory Visit: Payer: Medicaid Other | Attending: Internal Medicine

## 2020-03-16 NOTE — Progress Notes (Signed)
Cardiology Office Note    Date:  03/18/2020   ID:  Kerry Mason, DOB 1956/04/17, MRN [id number]  PCP:  Kerry Hatchet, MD  Cardiologist:  Dr. Martinique   Chief Complaint  Patient presents with  . Coronary Artery Disease    History of Present Illness:  Kerry Mason is a 64 y.o. male with CAD s/p CABG and multiple PCI, chronic systolic HF s/p ICD, HLD and tobacco abuse. He had a remote stenting of RCA in 2000 and was lost to follow-up. He ended up having anterior STEMI in January 2015, cardiac catheterization showed occluded proximal RCA with collaterals and the occlusion of the proximal LAD RCA occlusion was felt to be chronic, he was treated with drug-eluting stent to the occluded LAD. EF by cath was 35%. He subsequently required ICD placement. He had Myoview in December 2015 that was high risk with large area of anterior, septal, apical scar and inferior apical ischemia, EF was noted to be 25%. He underwent repeat cardiac cath that showed patent stents in the LAD, but first diagonal had 80% stenosis, chronically occluded RCA with collaterals. Due to his anginal symptom, he underwent a CTO of RCA, procedure was complicated by perforation of mid RCA which resulted in cardiac tamponade and required emergent pericardiocentesis and removal of 1L of blood. Perforation was sealed with Graftmaster and covered stent to the mid RCA. Remainder of his RCA was stented with 4 additional drug-eluting stents. He ended up having pericarditis afterward. He did develop brief PAF during the period of pericarditis.   He was admitted in July 2016 with NSTEMI. Cardiac catheterization showed reocclusion of mid RCA with left-to-right collaterals, he was treated medically with addition of Imdur. He also had a 50% stenosis in the left main, FFR was abnormal, he was referred for CT surgery. He underwent CABG by Dr. Roxy Manns in 9/16 with LIMA to LAD, SVG to ramus, RCA was not bypassed due to small target and significant  scarring from the prior pericarditis.   He was admitted to the hospital on 08/29/2017 with chest pain. He eventually underwent cardiac catheterization on 08/30/2017 with Dr. Saunders Revel which showed widely patent LIMA to LAD and SVG to ramus, moderate to severely reduced left ventricular contraction, multiple abnormal vessels arising from the LIMA graft, mediastinal mass is a possibility. Otherwise continue medical therapy and a secondary prevention of CAD. CT of chest showed moderate to severe upper lobe central lobar emphysema, 4.1 cm ascending aortic aneurysm, left lower paratracheal lymphadenopathy likely reactive, several scattered 2-3 mm pulmonary nodule.  ICD check in Feb 2021 was stable. Last seen in September 2019.   On follow up today he is doing very well. He now is farming full time raising rabbits, chickens, and quail. Has rare palpitations. Sometimes feels tired. Has no chest pain or dyspnea. He is smoking but wants to try and quit again.      Past Medical History:  Diagnosis Date  . Acute systolic CHF (congestive heart failure) ([place])   . AICD (automatic cardioverter/defibrillator) present   . Anxiety   . Ascending aortic aneurysm ([place]) 08/31/2017  . Back pain 08/31/2017  . Basal cell carcinoma    left arm  . CAD (coronary artery disease)    a. 2000: s/p stent of RCA 2000 with BMS  b. 2015: STEMI s/p LHC with old occlusion of RCA and DESx2 to LAD  c. 123456: complicated PCI on 3/2 for CTO of mid RCA with coronary perforation and cardiac tamponade requiring emergent pericardiocentesis     .  Cardiac tamponade    a. 02/03/15 2/2 coronary perforation during CTO procedure. Sealed with graftmaster coated stent.   Marland Kitchen COPD (chronic obstructive pulmonary disease) ([place]) 08/31/2017  . Gout   . History of pneumonia   . HTN (hypertension)   . Hyperlipidemia   . Ischemic cardiomyopathy    a. 2D ECHO: EF 25-30%. Akinesis of the anteroseptal and  . Left main coronary artery disease   . Myocardial  infarction ([place])   . Obesity   . 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 Shortness of breath dyspnea   . Tobacco abuse   . Urinary frequency     Past Surgical History:  Procedure Laterality Date  . CARDIAC CATHETERIZATION    . CARDIAC CATHETERIZATION N/A 06/14/2015   Procedure: Left Heart Cath and Coronary Angiography;  Surgeon: Lorretta Harp, MD;  Location: [place] CV LAB;  Service: Cardiovascular;  Laterality: N/A;  . CARDIAC CATHETERIZATION N/A 08/18/2015   Procedure: Intravascular Pressure Wire/FFR Study;  Surgeon:  M Martinique, MD;  Location: [place] [facility];  Service: Cardiovascular;  Laterality: N/A;  . COLONOSCOPY    . CORONARY ANGIOPLASTY  2000  . CORONARY ANGIOPLASTY WITH STENT PLACEMENT  2015  . CORONARY ARTERY BYPASS GRAFT N/A 09/02/2015   Procedure: CORONARY ARTERY BYPASS GRAFTING (CABG) x 2 (LIMA-LAD, SVG-Intermediate) ENDOSCOPIC GREATER SAPHENOUS VEIN HARVEST RIGHT THIGH;  Surgeon: Rexene Alberts, MD;  Location: [place];  Service: Open Heart Surgery;  Laterality: N/A;  . FOOT SURGERY    . IMPLANTABLE CARDIOVERTER DEFIBRILLATOR IMPLANT N/A 04/17/2014   Procedure: IMPLANTABLE CARDIOVERTER DEFIBRILLATOR IMPLANT;  Surgeon: Evans Lance, MD;  Location: [facility] CATH LAB;  Service: Cardiovascular;  Laterality: N/A;  . LEFT HEART CATH AND CORS/GRAFTS ANGIOGRAPHY N/A 08/30/2017   Procedure: LEFT HEART CATH AND CORS/GRAFTS ANGIOGRAPHY;  Surgeon: Nelva Bush, MD;  Location: [place] [facility];  Service: Cardiovascular;  Laterality: N/A;  . LEFT HEART CATHETERIZATION WITH CORONARY ANGIOGRAM N/A 12/11/2013   Procedure: LEFT HEART CATHETERIZATION WITH CORONARY ANGIOGRAM;  Surgeon:  M Martinique, MD;  Location: [facility] CATH LAB;  Service: Cardiovascular;  Laterality: N/A;  . LEFT HEART CATHETERIZATION WITH CORONARY ANGIOGRAM N/A 01/05/2015   Procedure: LEFT HEART CATHETERIZATION WITH CORONARY ANGIOGRAM;  Surgeon:  M Martinique, MD;   Location: [facility] CATH LAB;  Service: Cardiovascular;  Laterality: N/A;  . PERCUTANEOUS CORONARY STENT INTERVENTION (PCI-S)  12/11/2013   Procedure: PERCUTANEOUS CORONARY STENT INTERVENTION (PCI-S);  Surgeon:  M Martinique, MD;  Location: [facility] CATH LAB;  Service: Cardiovascular;;  prov LAD and mid LAD  . PERCUTANEOUS CORONARY STENT INTERVENTION (PCI-S) N/A 02/03/2015   Procedure: PERCUTANEOUS CORONARY STENT INTERVENTION (PCI-S);  Surgeon: Jettie Booze, MD;  Location: [facility] CATH LAB;  Service: Cardiovascular;  Laterality: N/A;  . TEE WITHOUT CARDIOVERSION N/A 09/02/2015   Procedure: TRANSESOPHAGEAL ECHOCARDIOGRAM (TEE);  Surgeon: Rexene Alberts, MD;  Location: [place];  Service: Open Heart Surgery;  Laterality: N/A;    Current Medications: Outpatient Medications Prior to Visit  Medication Sig Dispense Refill  . acetaminophen (TYLENOL) 325 MG tablet Take 2 tablets (650 mg total) by mouth every 4 (four) hours as needed for headache or mild pain.    Marland Kitchen allopurinol (ZYLOPRIM) 300 MG tablet Take 1 tablet (300 mg total) by mouth daily. 90 tablet 3  . aspirin EC 81 MG tablet Take 81 mg by mouth daily.    . budesonide-formoterol (SYMBICORT) 160-4.5 MCG/ACT inhaler  Inhale 2 puffs into the lungs 2 (two) times daily. 1 Inhaler 6  . Omega-3 Fatty Acids (FISH OIL PO) Take 1 capsule by mouth daily.    . sildenafil (VIAGRA) 50 MG tablet Take 1 tablet (50 mg total) by mouth daily as needed for erectile dysfunction. 10 tablet 3  . Spacer/Aero-Holding Chambers (AEROCHAMBER MV) inhaler Use as instructed 1 each 0  . tiotropium (SPIRIVA) 18 MCG inhalation capsule Place 1 capsule (18 mcg total) into inhaler and inhale daily. 30 capsule 12  . atorvastatin (LIPITOR) 80 MG tablet Take 1 tablet (80 mg total) by mouth daily. 90 tablet 3  . carvedilol (COREG) 3.125 MG tablet TAKE 1 TABLET BY MOUTH TWICE DAILY WITH A MEAL 180 tablet 3  . furosemide (LASIX) 40 MG tablet Take 1 tablet (40 mg total) by mouth daily. 90 tablet 3  .  isosorbide mononitrate (IMDUR) 30 MG 24 hr tablet Take 1 tablet (30 mg total) by mouth daily. 90 tablet 3  . lisinopril (PRINIVIL,ZESTRIL) 5 MG tablet Take 1 tablet (5 mg total) by mouth daily. 90 tablet 3  . nitroGLYCERIN (NITROSTAT) 0.4 MG SL tablet DISSOLVE ONE TABLET UNDER THE TONGUE EVERY 5 MINUTES AS NEEDED FOR CHEST PAIN.  DO NOT EXCEED A TOTAL OF 3 DOSES IN 15 MINUTES 75 tablet 1  . spironolactone (ALDACTONE) 25 MG tablet Take 1 tablet (25 mg total) by mouth at bedtime. 90 tablet 3   No facility-administered medications prior to visit.     Allergies:   Patient has no known allergies.   Social History   Socioeconomic History  . Marital status: Married    Spouse name: Not on file  . Number of children: 2  . Years of education: Not on file  . Highest education level: Not on file  Occupational History  . 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 Smoking status: Current Every Day Smoker    Packs/day: 0.50    Years: 35.00    Pack years: 17.50    Types: Cigarettes  . Smokeless tobacco: Never Used  . Tobacco comment: pt states he is currently smoking 3-5 cigarettes daily 10/03/17  Substance and Sexual Activity  . Alcohol use: No    Alcohol/week: 0.0 standard drinks  . Drug use: No  . Sexual activity: Not Currently  Other Topics Concern  . Not on file  Social History Narrative  . Not on file   Social Determinants of Health   Financial Resource Strain:   . Difficulty of Paying Living Expenses:   Food Insecurity:   . Worried About Charity fundraiser in the Last Year:   . Arboriculturist in the Last Year:   Transportation Needs:   . Film/video editor (Medical):   Marland Kitchen Lack of Transportation (Non-Medical):   Physical Activity:   . Days of Exercise per Week:   . Minutes of Exercise per Session:   Stress:   . Feeling of Stress :   Social Connections:   . Frequency of Communication with Friends and Family:   . Frequency of Social Gatherings with Friends and  Family:   . Attends Religious Services:   . Active Member of Clubs or Organizations:   . Attends Archivist Meetings:   Marland Kitchen Marital Status:      Family History:  The patient's family history includes CAD in his father; Cancer in his mother.   ROS:   Please see the history of present illness.  ROS All other systems reviewed and are negative.   PHYSICAL EXAM:   VS:  BP 138/84   Pulse 70   Ht 6\' 1"  (1.854 m)   Wt 218 lb (98.9 kg)   SpO2 94%   BMI 28.76 kg/m    GENERAL:  Well appearing WM in NAD HEENT:  PERRL, EOMI, sclera are clear. Oropharynx is clear. NECK:  No jugular venous distention, carotid upstroke brisk and symmetric, no bruits, no thyromegaly or adenopathy LUNGS:  Clear to auscultation bilaterally CHEST:  Unremarkable HEART:  RRR,  PMI not displaced or sustained,S1 and S2 within normal limits, no S3, no S4: no clicks, no rubs, no murmurs ABD:  Soft, nontender. BS +, no masses or bruits. No hepatomegaly, no splenomegaly EXT:  2 + pulses throughout, no edema, no cyanosis no clubbing SKIN:  Warm and dry.  No rashes NEURO:  Alert and oriented x 3. Cranial nerves II through XII intact. PSYCH:  Cognitively intact    Wt Readings from Last 3 Encounters:  03/18/20 218 lb (98.9 kg)  09/11/18 217 lb 12.8 oz (98.8 kg)  08/21/18 213 lb 12.8 oz (97 kg)      Studies/Labs Reviewed:   EKG:  EKG is ordered today.  NSR with old anterior infarct. T wave inversion in the lateral leads. I have personally reviewed and interpreted this study.   Recent Labs: No results found for requested labs within last 8760 hours.   Lipid Panel    Component Value Date/Time   CHOL 148 08/21/2018 1130   TRIG 108 08/21/2018 1130   HDL 35 (L) 08/21/2018 1130   CHOLHDL 4.2 08/21/2018 1130   CHOLHDL 3.4 12/24/2015 0909   VLDL 16 12/24/2015 0909   LDLCALC 91 08/21/2018 1130    Additional studies/ records that were reviewed today include:   Pulmonary tests PFT 10/03/2017>> FEV1  2.57 (65%) TLC, 6.93 (91%), DLCO 60% PFT 08/31/15 >> FEV1 3.04 (75%), FEV1% 67, TLC 7.98 (104%), DLCO 61% CT chest 08/31/17 >> severe CAD, TAA 4.1 cm, mod/severe centrilobular emphysema, multiple nodules 2-3 mm  Cath 08/30/2017 FINDINGS: 1. Stable native coronary artery disease with 50% distal LMCA and chronic total occlusion of ostial RCA. Distal RCA fills via left-to-right collaterals. 2. Widely patent LIMA to LAD and SVG to ramus. 3. Multiple abnormal vessels arising from the LIMA graft. Mediastinal mass is a consideration. 4. Normal left ventricular filling pressure. 5. Moderate to severely reduced left ventricular contraction.  RECOMMENDATIONS: 1. Recommend CT chest to evaluate for intrathoracic mass. 2. Continue medical therapy and secondary prevention of CAD.    ASSESSMENT:    1. Chronic systolic heart failure ([place])   2. Hyperlipidemia, unspecified hyperlipidemia type   3. Coronary artery disease involving coronary bypass graft of native heart without angina pectoris   4. ICD (implantable cardioverter-defibrillator) in place      PLAN:   1. CAD s/p CABG: last cardiac cath in 2018.  No  anginal symptoms currently. Continue medical therapy.  2. Tobacco abuse: reinforced importance of complete smoking cessation. Chantix prescribed.   3. Hyperlipidemia: Continue Lipitor 40 mg daily, will check fasting labs today.  4. Chronic systolic heart failure s/p ICD: appears well compensated today.continue medical therapy.  5. Ascending aortic ectasia: Measuring 3.9 cm by last CT. No further monitoring needed  6. Pulmonary nodules: benign    Medication Adjustments/Labs and Tests Ordered: Current medicines are reviewed at length with the patient today.  Concerns regarding medicines are outlined above.  Medication changes, Labs and  Tests ordered today are listed in the Patient Instructions below. Patient Instructions  Start Chantix 0.5 mg bid for 4 weeks then 1 mg twice a day   Quit smoking      Signed,  Martinique, MD  03/18/2020 3:08 [facility] [address] [place], [place], [place]  [address] Phone: [phone]; Fax: [phone]

## 2020-03-18 ENCOUNTER — Encounter: Payer: Self-pay | Admitting: Cardiology

## 2020-03-18 ENCOUNTER — Ambulatory Visit: Payer: Medicaid Other | Admitting: Cardiology

## 2020-03-18 ENCOUNTER — Other Ambulatory Visit: Payer: Self-pay

## 2020-03-18 VITALS — BP 138/84 | HR 70 | Ht 73.0 in | Wt 218.0 lb

## 2020-03-18 DIAGNOSIS — I5022 Chronic systolic (congestive) heart failure: Secondary | ICD-10-CM

## 2020-03-18 DIAGNOSIS — E785 Hyperlipidemia, unspecified: Secondary | ICD-10-CM

## 2020-03-18 DIAGNOSIS — I25810 Atherosclerosis of coronary artery bypass graft(s) without angina pectoris: Secondary | ICD-10-CM | POA: Diagnosis not present

## 2020-03-18 DIAGNOSIS — Z95810 Presence of automatic (implantable) cardiac defibrillator: Secondary | ICD-10-CM | POA: Diagnosis not present

## 2020-03-18 MED ORDER — ISOSORBIDE MONONITRATE ER 30 MG PO TB24: 30.0000 mg | ORAL_TABLET | Freq: Every day | ORAL | 3 refills | Status: DC

## 2020-03-18 MED ORDER — SPIRONOLACTONE 25 MG PO TABS: 25.0000 mg | ORAL_TABLET | Freq: Every day | ORAL | 3 refills | Status: DC

## 2020-03-18 MED ORDER — VARENICLINE TARTRATE 0.5 MG PO TABS: 0.5000 mg | ORAL_TABLET | Freq: Two times a day (BID) | ORAL | 0 refills | Status: DC

## 2020-03-18 MED ORDER — CARVEDILOL 3.125 MG PO TABS: ORAL_TABLET | ORAL | 3 refills | Status: DC

## 2020-03-18 MED ORDER — NITROGLYCERIN 0.4 MG SL SUBL: SUBLINGUAL_TABLET | SUBLINGUAL | 1 refills | Status: DC

## 2020-03-18 MED ORDER — ATORVASTATIN CALCIUM 80 MG PO TABS: 80.0000 mg | ORAL_TABLET | Freq: Every day | ORAL | 3 refills | Status: DC

## 2020-03-18 MED ORDER — FUROSEMIDE 40 MG PO TABS: 40.0000 mg | ORAL_TABLET | Freq: Every day | ORAL | 3 refills | Status: DC

## 2020-03-18 MED ORDER — LISINOPRIL 5 MG PO TABS: 5.0000 mg | ORAL_TABLET | Freq: Every day | ORAL | 3 refills | Status: DC

## 2020-03-18 MED ORDER — VARENICLINE TARTRATE 1 MG PO TABS: 1.0000 mg | ORAL_TABLET | Freq: Two times a day (BID) | ORAL | 2 refills | Status: DC

## 2020-03-18 NOTE — Patient Instructions (Signed)
Start Chantix 0.5 mg bid for 4 weeks then 1 mg twice a day  Quit smoking

## 2020-03-19 LAB — HEPATIC FUNCTION PANEL
ALT: 9 IU/L (ref 0–44)
AST: 23 IU/L (ref 0–40)
Albumin: 4.5 g/dL (ref 3.8–4.8)
Alkaline Phosphatase: 80 IU/L (ref 39–117)
Bilirubin Total: 0.3 mg/dL (ref 0.0–1.2)
Bilirubin, Direct: 0.1 mg/dL (ref 0.00–0.40)
Total Protein: 7.8 g/dL (ref 6.0–8.5)

## 2020-03-19 LAB — LIPID PANEL
Chol/HDL Ratio: 5.3 ratio — ABNORMAL HIGH (ref 0.0–5.0)
Cholesterol, Total: 187 mg/dL (ref 100–199)
HDL: 35 mg/dL — ABNORMAL LOW (ref >39)
LDL Chol Calc (NIH): 118 mg/dL — ABNORMAL HIGH (ref 0–99)
Triglycerides: 195 mg/dL — ABNORMAL HIGH (ref 0–149)
VLDL Cholesterol Cal: 34 mg/dL (ref 5–40)

## 2020-03-19 LAB — BASIC METABOLIC PANEL
BUN/Creatinine Ratio: 16 (ref 10–24)
BUN: 14 mg/dL (ref 8–27)
CO2: 21 mmol/L (ref 20–29)
Calcium: 9.5 mg/dL (ref 8.6–10.2)
Chloride: 106 mmol/L (ref 96–106)
Creatinine, Ser: 0.85 mg/dL (ref 0.76–1.27)
GFR calc Af Amer: 107 mL/min/{1.73_m2} (ref >59)
GFR calc non Af Amer: 93 mL/min/{1.73_m2} (ref >59)
Glucose: 76 mg/dL (ref 65–99)
Potassium: 4.4 mmol/L (ref 3.5–5.2)
Sodium: 140 mmol/L (ref 134–144)

## 2020-03-23 ENCOUNTER — Other Ambulatory Visit: Payer: Self-pay

## 2020-03-23 ENCOUNTER — Encounter: Payer: Self-pay | Admitting: Internal Medicine

## 2020-03-23 ENCOUNTER — Ambulatory Visit: Payer: Medicaid Other | Admitting: Internal Medicine

## 2020-03-23 VITALS — BP 128/74 | HR 68 | Ht 73.0 in | Wt 216.0 lb

## 2020-03-23 DIAGNOSIS — I255 Ischemic cardiomyopathy: Secondary | ICD-10-CM

## 2020-03-23 DIAGNOSIS — I5021 Acute systolic (congestive) heart failure: Secondary | ICD-10-CM | POA: Diagnosis not present

## 2020-03-23 DIAGNOSIS — E785 Hyperlipidemia, unspecified: Secondary | ICD-10-CM | POA: Diagnosis not present

## 2020-03-23 DIAGNOSIS — Z95810 Presence of automatic (implantable) cardiac defibrillator: Secondary | ICD-10-CM

## 2020-03-23 LAB — CUP PACEART INCLINIC DEVICE CHECK
Brady Statistic RV Percent Paced: 0 %
Date Time Interrogation Session: 20210420153643
Implantable Lead Implant Date: 20150515
Implantable Lead Location: 753860
Implantable Lead Model: 365
Implantable Lead Serial Number: 10579264
Implantable Pulse Generator Implant Date: 20150515
Lead Channel Pacing Threshold Amplitude: 0.5 V
Lead Channel Pacing Threshold Pulse Width: 0.4 ms
Lead Channel Sensing Intrinsic Amplitude: 2.9 mV
Lead Channel Sensing Intrinsic Amplitude: 24.2 mV
Pulse Gen Model: 383594
Pulse Gen Serial Number: 60800912

## 2020-03-23 MED ORDER — CARVEDILOL 6.25 MG PO TABS: 6.2500 mg | ORAL_TABLET | Freq: Two times a day (BID) | ORAL | 3 refills | Status: DC

## 2020-03-23 NOTE — Progress Notes (Signed)
HPI Kerry Mason returns today for followup. He is a pleasant 64 yo man with CAD, s/p CABG, remote PCI's, and chronic systolic heart failure, s/p ICD Insertion. He has done well in the interim. He denies chest pain or sob. He is still smoking but trying to stop. He is not on oxygen but has significant COPD/emphysema.  No Known Allergies   Current Outpatient Medications  Medication Sig Dispense Refill  . acetaminophen (TYLENOL) 325 MG tablet Take 2 tablets (650 mg total) by mouth every 4 (four) hours as needed for headache or mild pain.    Marland Kitchen allopurinol (ZYLOPRIM) 300 MG tablet Take 1 tablet (300 mg total) by mouth daily. 90 tablet 3  . aspirin EC 81 MG tablet Take 81 mg by mouth daily.    Marland Kitchen atorvastatin (LIPITOR) 80 MG tablet Take 1 tablet (80 mg total) by mouth daily. 90 tablet 3  . budesonide-formoterol (SYMBICORT) 160-4.5 MCG/ACT inhaler Inhale 2 puffs into the lungs 2 (two) times daily. 1 Inhaler 6  . furosemide (LASIX) 40 MG tablet Take 1 tablet (40 mg total) by mouth daily. 90 tablet 3  . isosorbide mononitrate (IMDUR) 30 MG 24 hr tablet Take 1 tablet (30 mg total) by mouth daily. 90 tablet 3  . lisinopril (ZESTRIL) 5 MG tablet Take 1 tablet (5 mg total) by mouth daily. 90 tablet 3  . nitroGLYCERIN (NITROSTAT) 0.4 MG SL tablet DISSOLVE ONE TABLET UNDER THE TONGUE EVERY 5 MINUTES AS NEEDED FOR CHEST PAIN.  DO NOT EXCEED A TOTAL OF 3 DOSES IN 15 MINUTES 75 tablet 1  . Omega-3 Fatty Acids (FISH OIL PO) Take 1 capsule by mouth daily.    . sildenafil (VIAGRA) 50 MG tablet Take 1 tablet (50 mg total) by mouth daily as needed for erectile dysfunction. 10 tablet 3  . Spacer/Aero-Holding Chambers (AEROCHAMBER MV) inhaler Use as instructed 1 each 0  . spironolactone (ALDACTONE) 25 MG tablet Take 1 tablet (25 mg total) by mouth at bedtime. 90 tablet 3  . tiotropium (SPIRIVA) 18 MCG inhalation capsule Place 1 capsule (18 mcg total) into inhaler and inhale daily. 30 capsule 12  .  varenicline (CHANTIX CONTINUING MONTH PAK) 1 MG tablet Take 1 tablet (1 mg total) by mouth 2 (two) times daily. 60 tablet 2  . varenicline (CHANTIX) 0.5 MG tablet Take 1 tablet (0.5 mg total) by mouth 2 (two) times daily. 60 tablet 0  . carvedilol (COREG) 6.25 MG tablet Take 1 tablet (6.25 mg total) by mouth 2 (two) times daily. 180 tablet 3   No current facility-administered medications for this visit.     Past Medical History:  Diagnosis Date  . Acute systolic CHF (congestive heart failure) ([place])   . AICD (automatic cardioverter/defibrillator) present   . Anxiety   . Ascending aortic aneurysm ([place]) 08/31/2017  . Back pain 08/31/2017  . Basal cell carcinoma    left arm  . CAD (coronary artery disease)    a. 2000: s/p stent of RCA 2000 with BMS  b. 2015: STEMI s/p LHC with old occlusion of RCA and DESx2 to LAD  c. 123456: complicated PCI on 3/2 for CTO of mid RCA with coronary perforation and cardiac tamponade requiring emergent pericardiocentesis     . Cardiac tamponade    a. 02/03/15 2/2 coronary perforation during CTO procedure. Sealed with graftmaster coated stent.   Marland Kitchen COPD (chronic obstructive pulmonary disease) ([place]) 08/31/2017  . Gout   . History of pneumonia   .  HTN (hypertension)   . Hyperlipidemia   . Ischemic cardiomyopathy    a. 2D ECHO: EF 25-30%. Akinesis of the anteroseptal and  . Left main coronary artery disease   . Myocardial infarction ([place])   . Obesity   . 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 Shortness of breath dyspnea   . Tobacco abuse   . Urinary frequency     ROS:   All systems reviewed and negative except as noted in the HPI.   Past Surgical History:  Procedure Laterality Date  . CARDIAC CATHETERIZATION    . CARDIAC CATHETERIZATION N/A 06/14/2015   Procedure: Left Heart Cath and Coronary Angiography;  Surgeon: Lorretta Harp, MD;  Location: [place] CV LAB;  Service: Cardiovascular;   Laterality: N/A;  . CARDIAC CATHETERIZATION N/A 08/18/2015   Procedure: Intravascular Pressure Wire/FFR Study;  Surgeon: Peter M Martinique, MD;  Location: [place] CV LAB;  Service: Cardiovascular;  Laterality: N/A;  . COLONOSCOPY    . CORONARY ANGIOPLASTY  2000  . CORONARY ANGIOPLASTY WITH STENT PLACEMENT  2015  . CORONARY ARTERY BYPASS GRAFT N/A 09/02/2015   Procedure: CORONARY ARTERY BYPASS GRAFTING (CABG) x 2 (LIMA-LAD, SVG-Intermediate) ENDOSCOPIC GREATER SAPHENOUS VEIN HARVEST RIGHT THIGH;  Surgeon: Rexene Alberts, MD;  Location: [place];  Service: Open Heart Surgery;  Laterality: N/A;  . FOOT SURGERY    . IMPLANTABLE CARDIOVERTER DEFIBRILLATOR IMPLANT N/A 04/17/2014   Procedure: IMPLANTABLE CARDIOVERTER DEFIBRILLATOR IMPLANT;  Surgeon: Evans Lance, MD;  Location: [facility] CATH LAB;  Service: Cardiovascular;  Laterality: N/A;  . LEFT HEART CATH AND CORS/GRAFTS ANGIOGRAPHY N/A 08/30/2017   Procedure: LEFT HEART CATH AND CORS/GRAFTS ANGIOGRAPHY;  Surgeon: Nelva Bush, MD;  Location: [place] CV LAB;  Service: Cardiovascular;  Laterality: N/A;  . LEFT HEART CATHETERIZATION WITH CORONARY ANGIOGRAM N/A 12/11/2013   Procedure: LEFT HEART CATHETERIZATION WITH CORONARY ANGIOGRAM;  Surgeon: Peter M Martinique, MD;  Location: [facility] CATH LAB;  Service: Cardiovascular;  Laterality: N/A;  . LEFT HEART CATHETERIZATION WITH CORONARY ANGIOGRAM N/A 01/05/2015   Procedure: LEFT HEART CATHETERIZATION WITH CORONARY ANGIOGRAM;  Surgeon: Peter M Martinique, MD;  Location: [facility] CATH LAB;  Service: Cardiovascular;  Laterality: N/A;  . PERCUTANEOUS CORONARY STENT INTERVENTION (PCI-S)  12/11/2013   Procedure: PERCUTANEOUS CORONARY STENT INTERVENTION (PCI-S);  Surgeon: Peter M Martinique, MD;  Location: [facility] CATH LAB;  Service: Cardiovascular;;  prov LAD and mid LAD  . PERCUTANEOUS CORONARY STENT INTERVENTION (PCI-S) N/A 02/03/2015   Procedure: PERCUTANEOUS CORONARY STENT INTERVENTION (PCI-S);  Surgeon: Jettie Booze, MD;  Location: [facility]  CATH LAB;  Service: Cardiovascular;  Laterality: N/A;  . TEE WITHOUT CARDIOVERSION N/A 09/02/2015   Procedure: TRANSESOPHAGEAL ECHOCARDIOGRAM (TEE);  Surgeon: Rexene Alberts, MD;  Location: [place];  Service: Open Heart Surgery;  Laterality: N/A;     Family History  Problem Relation Age of Onset  . CAD Father        PTCA  . Cancer Mother        LYMPHOMA     Social History   Socioeconomic History  . Marital status: Married    Spouse name: Not on file  . Number of children: 2  . Years of education: Not on file  . Highest education level: Not on file  Occupational History  . 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 Smoking status: Current Every Day Smoker    Packs/day: 0.50  Years: 35.00    Pack years: 17.50    Types: Cigarettes  . Smokeless tobacco: Never Used  . Tobacco comment: pt states he is currently smoking 3-5 cigarettes daily 10/03/17  Substance and Sexual Activity  . Alcohol use: No    Alcohol/week: 0.0 standard drinks  . Drug use: No  . Sexual activity: Not Currently  Other Topics Concern  . Not on file  Social History Narrative  . Not on file   Social Determinants of Health   Financial Resource Strain:   . Difficulty of Paying Living Expenses:   Food Insecurity:   . Worried About Charity fundraiser in the Last Year:   . Arboriculturist in the Last Year:   Transportation Needs:   . Film/video editor (Medical):   Marland Kitchen Lack of Transportation (Non-Medical):   Physical Activity:   . Days of Exercise per Week:   . Minutes of Exercise per Session:   Stress:   . Feeling of Stress :   Social Connections:   . Frequency of Communication with Friends and Family:   . Frequency of Social Gatherings with Friends and Family:   . Attends Religious Services:   . Active Member of Clubs or Organizations:   . Attends Archivist Meetings:   Marland Kitchen Marital Status:   Intimate Partner Violence:   . Fear of Current or Ex-Partner:   . Emotionally  Abused:   Marland Kitchen Physically Abused:   . Sexually Abused:      BP 128/74   Pulse 68   Ht 6\' 1"  (1.854 m)   Wt 216 lb (98 kg)   SpO2 98%   BMI 28.50 kg/m   Physical Exam:  Well appearing but overweight man, NAD HEENT: Unremarkable Neck:  No JVD, no thyromegally Lymphatics:  No adenopathy Back:  No CVA tenderness Lungs:  Clear with no wheezes HEART:  Regular rate rhythm, no murmurs, no rubs, no clicks Abd:  soft, positive bowel sounds, no organomegally, no rebound, no guarding Ext:  2 plus pulses, no edema, no cyanosis, no clubbing Skin:  No rashes no nodules Neuro:  CN II through XII intact, motor grossly intact   DEVICE  Normal device function.  See PaceArt for details.   Assess/Plan: 1. ICD - his biotronik VDD ICD is working normally. No atrial fib and no VT 2. CAD - he denies anginal symptoms. He will continue his current meds. 3. Chronic systolic heart failure - his symptoms are class and he is on maximal medical therapy. 4. Tobacco abuse -he is encouraged to stop smoking.  Kerry Mason.D.

## 2020-03-23 NOTE — Patient Instructions (Addendum)
Medication Instructions:  Your physician has recommended you make the following change in your medication:   1.  INcrease your carvedilol--Take 6.25 mg one tablet by mouth twice a day  You may take 2 tablets of your 3.125 mg tablets twice a day until they are gone.  Labwork: None ordered.  Testing/Procedures: None ordered.  Follow-Up: Your physician wants you to follow-up in: one year with Dr. [name].   You will receive a reminder letter in the mail two months in advance. If you don't receive a letter, please call our office to schedule the follow-up appointment.  Remote monitoring is used to monitor your ICD from home. This monitoring reduces the number of office visits required to check your device to one time per year. It allows [place] to keep an eye on the functioning of your device to ensure it is working properly. You are scheduled for a device check from home on 04/15/2020. You may send your transmission at any time that day. If you have a wireless device, the transmission will be sent automatically. After your physician reviews your transmission, you will receive a postcard with your next transmission date.  Any Other Special Instructions Will Be Listed Below (If Applicable).  If you need a refill on your cardiac medications before your next appointment, please call your pharmacy.

## 2020-04-15 ENCOUNTER — Ambulatory Visit (INDEPENDENT_AMBULATORY_CARE_PROVIDER_SITE_OTHER): Payer: Medicaid Other | Admitting: *Deleted

## 2020-04-15 DIAGNOSIS — I255 Ischemic cardiomyopathy: Secondary | ICD-10-CM

## 2020-04-16 LAB — CUP PACEART REMOTE DEVICE CHECK
Date Time Interrogation Session: 20210514071111
Implantable Lead Implant Date: 20150515
Implantable Lead Location: 753860
Implantable Lead Model: 365
Implantable Lead Serial Number: 10579264
Implantable Pulse Generator Implant Date: 20150515
Pulse Gen Model: 383594
Pulse Gen Serial Number: 60800912

## 2020-04-19 NOTE — Progress Notes (Signed)
Remote ICD transmission.   

## 2020-04-27 ENCOUNTER — Ambulatory Visit: Payer: Medicaid Other

## 2020-06-01 ENCOUNTER — Ambulatory Visit: Payer: Medicaid Other

## 2020-06-01 NOTE — Progress Notes (Deleted)
Patient ID: Idan Prime                 DOB: [birth year]                    MRN: [id number]     HPI:  Kerry Mason is a 64 y.o. male patient referred to lipid clinic by Dr Martinique. PMH is significant for CAD s/p CABG and multiple PCI, MI in Jan 2015,  HFrEF, ICD placement, AAA, gout, and tobacco abuse.  Patient presents for lipid clinic for potential PCSK9i initiation and counseling.   Current Medications:  Atorvastatin 80mg  daily Omega-3 1000mg  daily  Intolerances: none  LDL goal: < 55mg /dL or 50% reduction  Diet:   Exercise:   Family History: The patient's family history includes CAD in his father; Cancer in his mother.   Social History: smoking? Denies alcohol use  Labs:  Past Medical History:  Diagnosis Date  . Acute systolic CHF (congestive heart failure) ([place])   . AICD (automatic cardioverter/defibrillator) present   . Anxiety   . Ascending aortic aneurysm ([place]) 08/31/2017  . Back pain 08/31/2017  . Basal cell carcinoma    left arm  . CAD (coronary artery disease)    a. 2000: s/p stent of RCA 2000 with BMS  b. 2015: STEMI s/p LHC with old occlusion of RCA and DESx2 to LAD  c. 01/12/50: complicated PCI on 3/2 for CTO of mid RCA with coronary perforation and cardiac tamponade requiring emergent pericardiocentesis     . Cardiac tamponade    a. 02/03/15 2/2 coronary perforation during CTO procedure. Sealed with graftmaster coated stent.   Marland Kitchen COPD (chronic obstructive pulmonary disease) ([place]) 08/31/2017  . Gout   . History of pneumonia   . HTN (hypertension)   . Hyperlipidemia   . Ischemic cardiomyopathy    a. 2D ECHO: EF 25-30%. Akinesis of the anteroseptal and  . Left main coronary artery disease   . Myocardial infarction ([place])   . Obesity   . 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 Shortness of breath dyspnea   . Tobacco abuse   . Urinary frequency     Current Outpatient Medications on File Prior to Visit    Medication Sig Dispense Refill  . acetaminophen (TYLENOL) 325 MG tablet Take 2 tablets (650 mg total) by mouth every 4 (four) hours as needed for headache or mild pain.    Marland Kitchen allopurinol (ZYLOPRIM) 300 MG tablet Take 1 tablet (300 mg total) by mouth daily. 90 tablet 3  . aspirin EC 81 MG tablet Take 81 mg by mouth daily.    Marland Kitchen atorvastatin (LIPITOR) 80 MG tablet Take 1 tablet (80 mg total) by mouth daily. 90 tablet 3  . budesonide-formoterol (SYMBICORT) 160-4.5 MCG/ACT inhaler Inhale 2 puffs into the lungs 2 (two) times daily. 1 Inhaler 6  . carvedilol (COREG) 6.25 MG tablet Take 1 tablet (6.25 mg total) by mouth 2 (two) times daily. 180 tablet 3  . furosemide (LASIX) 40 MG tablet Take 1 tablet (40 mg total) by mouth daily. 90 tablet 3  . isosorbide mononitrate (IMDUR) 30 MG 24 hr tablet Take 1 tablet (30 mg total) by mouth daily. 90 tablet 3  . lisinopril (ZESTRIL) 5 MG tablet Take 1 tablet (5 mg total) by mouth daily. 90 tablet 3  . nitroGLYCERIN (NITROSTAT) 0.4 MG SL tablet DISSOLVE ONE TABLET UNDER THE TONGUE EVERY 5  MINUTES AS NEEDED FOR CHEST PAIN.  DO NOT EXCEED A TOTAL OF 3 DOSES IN 15 MINUTES 75 tablet 1  . Omega-3 Fatty Acids (FISH OIL PO) Take 1 capsule by mouth daily.    . sildenafil (VIAGRA) 50 MG tablet Take 1 tablet (50 mg total) by mouth daily as needed for erectile dysfunction. 10 tablet 3  . Spacer/Aero-Holding Chambers (AEROCHAMBER MV) inhaler Use as instructed 1 each 0  . spironolactone (ALDACTONE) 25 MG tablet Take 1 tablet (25 mg total) by mouth at bedtime. 90 tablet 3  . tiotropium (SPIRIVA) 18 MCG inhalation capsule Place 1 capsule (18 mcg total) into inhaler and inhale daily. 30 capsule 12  . varenicline (CHANTIX CONTINUING MONTH PAK) 1 MG tablet Take 1 tablet (1 mg total) by mouth 2 (two) times daily. 60 tablet 2  . varenicline (CHANTIX) 0.5 MG tablet Take 1 tablet (0.5 mg total) by mouth 2 (two) times daily. 60 tablet 0   No current facility-administered medications on  file prior to visit.    No Known Allergies  No problem-specific Assessment & Plan notes found for this encounter.      Tristyn Demarest Rodriguez-Guzman PharmD, BCPS, [place] [address] [place] [address] 06/01/2020 8:20 AM

## 2020-06-09 ENCOUNTER — Encounter: Payer: Self-pay | Admitting: General Practice

## 2020-06-20 ENCOUNTER — Other Ambulatory Visit: Payer: Self-pay

## 2020-06-20 ENCOUNTER — Encounter (HOSPITAL_COMMUNITY): Payer: Self-pay

## 2020-06-20 ENCOUNTER — Emergency Department (HOSPITAL_COMMUNITY)
Admission: EM | Admit: 2020-06-20 | Discharge: 2020-06-21 | Disposition: A | Discharge status: Home / Self Care | Payer: Medicaid Other | Attending: Emergency Medicine | Admitting: Emergency Medicine

## 2020-06-20 DIAGNOSIS — I2510 Atherosclerotic heart disease of native coronary artery without angina pectoris: Secondary | ICD-10-CM | POA: Diagnosis not present

## 2020-06-20 DIAGNOSIS — J449 Chronic obstructive pulmonary disease, unspecified: Secondary | ICD-10-CM | POA: Diagnosis not present

## 2020-06-20 DIAGNOSIS — I714 Abdominal aortic aneurysm, without rupture, unspecified: Secondary | ICD-10-CM

## 2020-06-20 DIAGNOSIS — Z951 Presence of aortocoronary bypass graft: Secondary | ICD-10-CM | POA: Insufficient documentation

## 2020-06-20 DIAGNOSIS — F17210 Nicotine dependence, cigarettes, uncomplicated: Secondary | ICD-10-CM | POA: Insufficient documentation

## 2020-06-20 DIAGNOSIS — R109 Unspecified abdominal pain: Secondary | ICD-10-CM | POA: Diagnosis present

## 2020-06-20 DIAGNOSIS — I110 Hypertensive heart disease with heart failure: Secondary | ICD-10-CM | POA: Diagnosis not present

## 2020-06-20 DIAGNOSIS — Z85828 Personal history of other malignant neoplasm of skin: Secondary | ICD-10-CM | POA: Diagnosis not present

## 2020-06-20 DIAGNOSIS — I5021 Acute systolic (congestive) heart failure: Secondary | ICD-10-CM | POA: Insufficient documentation

## 2020-06-20 DIAGNOSIS — N289 Disorder of kidney and ureter, unspecified: Secondary | ICD-10-CM | POA: Insufficient documentation

## 2020-06-20 DIAGNOSIS — A09 Infectious gastroenteritis and colitis, unspecified: Secondary | ICD-10-CM

## 2020-06-20 LAB — COMPREHENSIVE METABOLIC PANEL
ALT: 14 U/L (ref 0–44)
AST: 28 U/L (ref 15–41)
Albumin: 4.3 g/dL (ref 3.5–5.0)
Alkaline Phosphatase: 66 U/L (ref 38–126)
Anion gap: 14 (ref 5–15)
BUN: 26 mg/dL — ABNORMAL HIGH (ref 8–23)
CO2: 18 mmol/L — ABNORMAL LOW (ref 22–32)
Calcium: 9.2 mg/dL (ref 8.9–10.3)
Chloride: 100 mmol/L (ref 98–111)
Creatinine, Ser: 2.15 mg/dL — ABNORMAL HIGH (ref 0.61–1.24)
GFR calc Af Amer: 36 mL/min — ABNORMAL LOW (ref >60)
GFR calc non Af Amer: 31 mL/min — ABNORMAL LOW (ref >60)
Glucose, Bld: 105 mg/dL — ABNORMAL HIGH (ref 70–99)
Potassium: 3.5 mmol/L (ref 3.5–5.1)
Sodium: 132 mmol/L — ABNORMAL LOW (ref 135–145)
Total Bilirubin: 1 mg/dL (ref 0.3–1.2)
Total Protein: 9.2 g/dL — ABNORMAL HIGH (ref 6.5–8.1)

## 2020-06-20 LAB — CBC
HCT: 52.5 % — ABNORMAL HIGH (ref 39.0–52.0)
Hemoglobin: 18.3 g/dL — ABNORMAL HIGH (ref 13.0–17.0)
MCH: 30.4 pg (ref 26.0–34.0)
MCHC: 34.9 g/dL (ref 30.0–36.0)
MCV: 87.4 fL (ref 80.0–100.0)
Platelets: 254 10*3/uL (ref 150–400)
RBC: 6.01 MIL/uL — ABNORMAL HIGH (ref 4.22–5.81)
RDW: 13.9 % (ref 11.5–15.5)
WBC: 16.3 10*3/uL — ABNORMAL HIGH (ref 4.0–10.5)
nRBC: 0 % (ref 0.0–0.2)

## 2020-06-20 LAB — URINALYSIS, ROUTINE W REFLEX MICROSCOPIC
Bilirubin Urine: NEGATIVE
Glucose, UA: NEGATIVE mg/dL
Hgb urine dipstick: NEGATIVE
Ketones, ur: NEGATIVE mg/dL
Leukocytes,Ua: NEGATIVE
Nitrite: NEGATIVE
Protein, ur: 100 mg/dL — AB
Specific Gravity, Urine: 1.017 (ref 1.005–1.030)
pH: 5 (ref 5.0–8.0)

## 2020-06-20 LAB — LIPASE, BLOOD: Lipase: 30 U/L (ref 11–51)

## 2020-06-20 MED ORDER — SODIUM CHLORIDE 0.9% FLUSH: 3.0000 mL | Freq: Once | INTRAVENOUS | Status: DC

## 2020-06-20 MED ORDER — ONDANSETRON 4 MG PO TBDP
4.0000 mg | ORAL_TABLET | Freq: Once | ORAL | Status: AC | PRN
  Administered 2020-06-20: 4 mg via ORAL
  Filled 2020-06-20: qty 1

## 2020-06-20 NOTE — ED Triage Notes (Signed)
Patient arrived with complaints of right flank pain and diarrhea since Friday. States he has had a fever and nauseated.

## 2020-06-21 ENCOUNTER — Emergency Department (HOSPITAL_COMMUNITY): Payer: Medicaid Other

## 2020-06-21 ENCOUNTER — Telehealth: Payer: Self-pay | Admitting: Cardiology

## 2020-06-21 LAB — BASIC METABOLIC PANEL
Anion gap: 12 (ref 5–15)
BUN: 31 mg/dL — ABNORMAL HIGH (ref 8–23)
CO2: 20 mmol/L — ABNORMAL LOW (ref 22–32)
Calcium: 8.5 mg/dL — ABNORMAL LOW (ref 8.9–10.3)
Chloride: 100 mmol/L (ref 98–111)
Creatinine, Ser: 1.77 mg/dL — ABNORMAL HIGH (ref 0.61–1.24)
GFR calc Af Amer: 46 mL/min — ABNORMAL LOW (ref >60)
GFR calc non Af Amer: 40 mL/min — ABNORMAL LOW (ref >60)
Glucose, Bld: 113 mg/dL — ABNORMAL HIGH (ref 70–99)
Potassium: 3.1 mmol/L — ABNORMAL LOW (ref 3.5–5.1)
Sodium: 132 mmol/L — ABNORMAL LOW (ref 135–145)

## 2020-06-21 LAB — C DIFFICILE QUICK SCREEN W PCR REFLEX
C Diff antigen: NEGATIVE
C Diff interpretation: NOT DETECTED
C Diff toxin: NEGATIVE

## 2020-06-21 MED ORDER — ONDANSETRON HCL 4 MG PO TABS
4.0000 mg | ORAL_TABLET | Freq: Three times a day (TID) | ORAL | 0 refills | Status: DC | PRN
Start: 2020-06-21 — End: 2020-07-15

## 2020-06-21 MED ORDER — CIPROFLOXACIN HCL 500 MG PO TABS
500.0000 mg | ORAL_TABLET | Freq: Two times a day (BID) | ORAL | 0 refills | Status: DC
Start: 2020-06-21 — End: 2020-07-15

## 2020-06-21 MED ORDER — FENTANYL CITRATE (PF) 100 MCG/2ML IJ SOLN
50.0000 ug | Freq: Once | INTRAMUSCULAR | Status: AC
  Administered 2020-06-21: 50 ug via INTRAVENOUS
  Filled 2020-06-21: qty 2

## 2020-06-21 MED ORDER — ONDANSETRON HCL 4 MG/2ML IJ SOLN
4.0000 mg | Freq: Once | INTRAMUSCULAR | Status: AC
  Administered 2020-06-21: 4 mg via INTRAVENOUS
  Filled 2020-06-21: qty 2

## 2020-06-21 MED ORDER — OXYCODONE-ACETAMINOPHEN 5-325 MG PO TABS: 1.0000 | ORAL_TABLET | Freq: Four times a day (QID) | ORAL | 0 refills | Status: DC | PRN

## 2020-06-21 MED ORDER — CIPROFLOXACIN IN D5W 400 MG/200ML IV SOLN
400.0000 mg | Freq: Once | INTRAVENOUS | Status: AC
  Administered 2020-06-21: 400 mg via INTRAVENOUS
  Filled 2020-06-21: qty 200

## 2020-06-21 MED ORDER — LACTATED RINGERS IV BOLUS
2000.0000 mL | Freq: Once | INTRAVENOUS | Status: AC
  Administered 2020-06-21: 2000 mL via INTRAVENOUS

## 2020-06-21 NOTE — ED Notes (Signed)
Pt unable to have a BM for GI PCRs at this time.

## 2020-06-21 NOTE — Telephone Encounter (Signed)
°  Patient was at [facility] last night and they have referred him to a vascular surgeon for an arterial aneurysm and he would prefer Dr Martinique to refer him to someone.

## 2020-06-21 NOTE — Telephone Encounter (Signed)
Left detailed message for patient that dr Martinique agrees with referral to [facility].

## 2020-06-21 NOTE — Telephone Encounter (Signed)
I would recommend he be seen by [facility] for AAA 4.9 cm.   Kele Barthelemy Martinique MD, [facility]

## 2020-06-21 NOTE — Telephone Encounter (Signed)
Forward to Dr Jordan  

## 2020-06-21 NOTE — ED Provider Notes (Signed)
Emergency Department Provider Note  I have reviewed the triage vital signs and the nursing notes.  HISTORY  Chief Complaint Abdominal Pain and Back Pain   HPI Kerry Mason is a 64 y.o. male with medical problems as documented below who presents to the emergency department today secondary to abdominal pain, right flank pain, diarrhea for 3 days and nausea for 3 days with some intermittent nonbloody nonbilious vomiting.  T-max at home was 102 on Friday 801 yesterday.  Persistently getting worse with decreased urine and darker urine.  No other associated symptoms.  No sick contacts.  No suspicious food intake.   No other associated or modifying symptoms.    Past Medical History:  Diagnosis Date  . Acute systolic CHF (congestive heart failure) ([place])   . AICD (automatic cardioverter/defibrillator) present   . Anxiety   . Ascending aortic aneurysm ([place]) 08/31/2017  . Back pain 08/31/2017  . Basal cell carcinoma    left arm  . CAD (coronary artery disease)    a. 2000: s/p stent of RCA 2000 with BMS  b. 2015: STEMI s/p LHC with old occlusion of RCA and DESx2 to LAD  c. 12/09/08: complicated PCI on 3/2 for CTO of mid RCA with coronary perforation and cardiac tamponade requiring emergent pericardiocentesis     . Cardiac tamponade    a. 02/03/15 2/2 coronary perforation during CTO procedure. Sealed with graftmaster coated stent.   Marland Kitchen COPD (chronic obstructive pulmonary disease) ([place]) 08/31/2017  . Gout   . History of pneumonia   . HTN (hypertension)   . Hyperlipidemia   . Ischemic cardiomyopathy    a. 2D ECHO: EF 25-30%. Akinesis of the anteroseptal and  . Left main coronary artery disease   . Myocardial infarction ([place])   . Obesity   . 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 Shortness of breath dyspnea   . Tobacco abuse   . Urinary frequency     Patient Active Problem List   Diagnosis Date Noted  . Urinary frequency   .  Obesity   . Myocardial infarction ([place])   . Ischemic cardiomyopathy   . History of pneumonia   . Cardiac tamponade   . CAD (coronary artery disease)   . Basal cell carcinoma   . AICD (automatic cardioverter/defibrillator) present   . Acute systolic CHF (congestive heart failure) ([place])   . Back pain 08/31/2017  . Ascending aortic aneurysm ([place]) 08/31/2017  . COPD (chronic obstructive pulmonary disease) ([place]) 08/31/2017  . Coronary artery disease involving native coronary artery of native heart with angina pectoris ([place])   . 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 CAD, multiple vessel 08/23/2015  . Left main coronary artery disease   . Unstable angina ([place])   . NSTEMI (non-ST elevated myocardial infarction) ([place])   . Chest pain 06/13/2015  . PAF- Amiodarone stopped, holding NSR 02/18/2015  . ISR RCA DES- plan medical Rx 02/08/2015  . CAD S/P multiple PCI- last March 9604 to RCA complicated by tamponade   . Anxiety   . Gout   . Pericardial effusion   . ICD in place 07/21/2014  . Chronic systolic CHF (congestive heart failure), NYHA class 3 ([place]) 12/14/2013  . Cardiomyopathy, ischemic-EF 25% 12/14/2013  . HTN (hypertension) 12/14/2013  . Hyperlipidemia 12/14/2013  . Tobacco abuse 12/11/2013    Past Surgical History:  Procedure Laterality Date  . CARDIAC CATHETERIZATION    .  CARDIAC CATHETERIZATION N/A 06/14/2015   Procedure: Left Heart Cath and Coronary Angiography;  Surgeon: Lorretta Harp, MD;  Location: [facility] CV LAB;  Service: Cardiovascular;  Laterality: N/A;  . CARDIAC CATHETERIZATION N/A 08/18/2015   Procedure: Intravascular Pressure Wire/FFR Study;  Surgeon: Peter M Martinique, MD;  Location: [facility] CV LAB;  Service: Cardiovascular;  Laterality: N/A;  . COLONOSCOPY    . CORONARY ANGIOPLASTY  2000  . CORONARY ANGIOPLASTY WITH STENT PLACEMENT  2015  . CORONARY ARTERY BYPASS GRAFT N/A 09/02/2015   Procedure: CORONARY ARTERY BYPASS GRAFTING (CABG) x 2 (LIMA-LAD, SVG-Intermediate)  ENDOSCOPIC GREATER SAPHENOUS VEIN HARVEST RIGHT THIGH;  Surgeon: Rexene Alberts, MD;  Location: [facility];  Service: Open Heart Surgery;  Laterality: N/A;  . FOOT SURGERY    . IMPLANTABLE CARDIOVERTER DEFIBRILLATOR IMPLANT N/A 04/17/2014   Procedure: IMPLANTABLE CARDIOVERTER DEFIBRILLATOR IMPLANT;  Surgeon: Evans Lance, MD;  Location: [facility] CATH LAB;  Service: Cardiovascular;  Laterality: N/A;  . LEFT HEART CATH AND CORS/GRAFTS ANGIOGRAPHY N/A 08/30/2017   Procedure: LEFT HEART CATH AND CORS/GRAFTS ANGIOGRAPHY;  Surgeon: Nelva Bush, MD;  Location: [facility] CV LAB;  Service: Cardiovascular;  Laterality: N/A;  . LEFT HEART CATHETERIZATION WITH CORONARY ANGIOGRAM N/A 12/11/2013   Procedure: LEFT HEART CATHETERIZATION WITH CORONARY ANGIOGRAM;  Surgeon: Peter M Martinique, MD;  Location: [facility] CATH LAB;  Service: Cardiovascular;  Laterality: N/A;  . LEFT HEART CATHETERIZATION WITH CORONARY ANGIOGRAM N/A 01/05/2015   Procedure: LEFT HEART CATHETERIZATION WITH CORONARY ANGIOGRAM;  Surgeon: Peter M Martinique, MD;  Location: [facility] CATH LAB;  Service: Cardiovascular;  Laterality: N/A;  . PERCUTANEOUS CORONARY STENT INTERVENTION (PCI-S)  12/11/2013   Procedure: PERCUTANEOUS CORONARY STENT INTERVENTION (PCI-S);  Surgeon: Peter M Martinique, MD;  Location: [facility] CATH LAB;  Service: Cardiovascular;;  prov LAD and mid LAD  . PERCUTANEOUS CORONARY STENT INTERVENTION (PCI-S) N/A 02/03/2015   Procedure: PERCUTANEOUS CORONARY STENT INTERVENTION (PCI-S);  Surgeon: Jettie Booze, MD;  Location: [facility] CATH LAB;  Service: Cardiovascular;  Laterality: N/A;  . TEE WITHOUT CARDIOVERSION N/A 09/02/2015   Procedure: TRANSESOPHAGEAL ECHOCARDIOGRAM (TEE);  Surgeon: Rexene Alberts, MD;  Location: [facility];  Service: Open Heart Surgery;  Laterality: N/A;    Current Outpatient Rx  . Order #: [id number] Class: No Print  . Order #: [id number] Class: Normal  . Order #: [id number] Class: Historical Med  . Order #: [id number] Class: Normal  . Order #:  [id number] Class: Normal  . Order #: [id number] Class: Normal  . Order #: [id number] Class: Normal  . Order #: [id number] Class: Normal  . Order #: [id number] Class: Normal  . Order #: [id number] Class: Print  . Order #: [id number] Class: Print  . Order #: [id number] Class: Normal  . Order #: [id number] Class: Normal  . Order #: [id number] Class: Print  . Order #: [id number] Class: Print    Allergies Patient has no known allergies.  Family History  Problem Relation Age of Onset  . CAD Father        PTCA  . Cancer Mother        LYMPHOMA    Social History Social History   Tobacco Use  . Smoking status: Current Every Day Smoker    Packs/day: 0.50    Years: 35.00    Pack years: 17.50    Types: Cigarettes  . Smokeless tobacco: Never Used  . Tobacco comment: pt states he is currently smoking 3-5 cigarettes daily 10/03/17  Vaping Use  . Vaping Use: Never used  Substance Use Topics  . Alcohol use: No    Alcohol/week: 0.0  standard drinks  . Drug use: No    Review of Systems  All other systems negative except as documented in the HPI. All pertinent positives and negatives as reviewed in the HPI. ____________________________________________  PHYSICAL EXAM:  VITAL SIGNS: ED Triage Vitals  Enc Vitals Group     BP 06/20/20 2224 122/80     Pulse Rate 06/20/20 2224 83     Resp 06/20/20 2224 15     Temp 06/20/20 2224 98.4 F (36.9 C)     Temp Source 06/20/20 2224 Oral     SpO2 06/20/20 2224 95 %     Weight 06/20/20 2224 208 lb 14.4 oz (94.8 kg)     Height 06/20/20 2224 6\' 1"  (1.854 m)    Constitutional: Alert and oriented. Well appearing and in no acute distress. Eyes: Conjunctivae are normal. PERRL. EOMI. Head: Atraumatic. Nose: No congestion/rhinnorhea. Mouth/Throat: Mucous membranes are dry.  Oropharynx non-erythematous. Neck: No stridor.  No meningeal signs.   Cardiovascular: Normal rate, regular rhythm. Good peripheral circulation. Grossly normal heart sounds.   Respiratory:  Normal respiratory effort.  No retractions. Lungs CTAB. Gastrointestinal: Soft and nontender. No distention.  Musculoskeletal: No lower extremity tenderness nor edema. No gross deformities of extremities. Neurologic:  Normal speech and language. No gross focal neurologic deficits are appreciated.  Skin:  Skin is warm, dry and intact. No rash noted.  ____________________________________________   LABS (all labs ordered are listed, but only abnormal results are displayed)  Labs Reviewed  COMPREHENSIVE METABOLIC PANEL - Abnormal; Notable for the following components:      Result Value   Sodium 132 (*)    CO2 18 (*)    Glucose, Bld 105 (*)    BUN 26 (*)    Creatinine, Ser 2.15 (*)    Total Protein 9.2 (*)    GFR calc non Af Amer 31 (*)    GFR calc Af Amer 36 (*)    All other components within normal limits  CBC - Abnormal; Notable for the following components:   WBC 16.3 (*)    RBC 6.01 (*)    Hemoglobin 18.3 (*)    HCT 52.5 (*)    All other components within normal limits  URINALYSIS, ROUTINE W REFLEX MICROSCOPIC - Abnormal; Notable for the following components:   Color, Urine AMBER (*)    APPearance CLOUDY (*)    Protein, ur 100 (*)    Bacteria, UA RARE (*)    All other components within normal limits  BASIC METABOLIC PANEL - Abnormal; Notable for the following components:   Sodium 132 (*)    Potassium 3.1 (*)    CO2 20 (*)    Glucose, Bld 113 (*)    BUN 31 (*)    Creatinine, Ser 1.77 (*)    Calcium 8.5 (*)    GFR calc non Af Amer 40 (*)    GFR calc Af Amer 46 (*)    All other components within normal limits  C DIFFICILE QUICK SCREEN W PCR REFLEX  URINE CULTURE  GASTROINTESTINAL PANEL BY PCR, STOOL (REPLACES STOOL CULTURE)  LIPASE, BLOOD   ____________________________________________  EKG   EKG Interpretation  Date/Time:  Sunday June 20 2020 23:23:31 EDT Ventricular Rate:  86 PR Interval:    QRS Duration: 107 QT Interval:  354 QTC Calculation: 424 R  Axis:   104 Text Interpretation: Sinus rhythm Left atrial enlargement Anterolateral infarct, age indeterminate 12 Lead; Mason-Likar ST changes in V2-6 similar to improved compared to september  2018 Confirmed by Merrily Pew [phone]) on 06/20/2020 11:33:33 PM      ____________________________________________  RADIOLOGY  CT Renal Stone Study  Result Date: 06/21/2020 CLINICAL DATA:  Right flank pain and diarrhea. EXAM: CT ABDOMEN AND PELVIS WITHOUT CONTRAST TECHNIQUE: Multidetector CT imaging of the abdomen and pelvis was performed following the standard protocol without IV contrast. COMPARISON:  None. FINDINGS: Lower chest: No acute abnormality. Hepatobiliary: No focal liver abnormality is seen. No gallstones, gallbladder wall thickening, or biliary dilatation. Pancreas: Unremarkable. No pancreatic ductal dilatation or surrounding inflammatory changes. Spleen: Normal in size without focal abnormality. Adrenals/Urinary Tract: Adrenal glands are unremarkable. Kidneys are normal, without renal calculi or hydronephrosis. A 2.0 cm exophytic cyst is seen along the posterior aspect of the mid left kidney. A 1.4 cm exophytic cyst is noted along the posterior aspect of the mid right kidney. Bladder is unremarkable. Stomach/Bowel: There is a small hiatal hernia. Appendix appears normal. No evidence of bowel wall thickening, distention, or inflammatory changes. Noninflamed diverticula are seen throughout the sigmoid colon. Vascular/Lymphatic: There is marked severity calcification of the abdominal aorta. 4.9 cm x 4.4 cm aneurysmal dilatation of the infrarenal abdominal aorta is also seen. No enlarged abdominal or pelvic lymph nodes. Reproductive: The prostate gland is mildly enlarged. Other: No abdominal wall hernia or abnormality. No abdominopelvic ascites. Musculoskeletal: There is grade 1 anterolisthesis of the L5 vertebral body on S1. IMPRESSION: 1. Noninflamed sigmoid diverticulosis. 2. 4.9 cm x 4.4 cm infrarenal  abdominal aortic aneurysm. 3. Small hiatal hernia. 4. Grade 1 anterolisthesis of the L5 vertebral body on S1. 5. Aortic atherosclerosis. Aortic Atherosclerosis (ICD10-I70.0). Electronically Signed   By: Virgina Norfolk M.D.   On: 06/21/2020 02:22   ____________________________________________  PROCEDURES  Procedure(s) performed:   Procedures ____________________________________________  INITIAL IMPRESSION / ASSESSMENT AND PLAN / ED COURSE   This patient presents to the ED for concern of diarrhea, weakness and vomiting with flank pain, this involves an extensive number of treatment options, and is a complaint that carries with it a high risk of complications and morbidity.  The differential diagnosis includes nephrolithiasis, pyelonephritis, renal abscess, colitis, enteritis, cholecystitis.     Lab Tests:   I Ordered, reviewed, and interpreted labs, which included urinalysis which was cloudy but no evidence of urine infection.  CBC which appeared to be hemoconcentrated with elevated white blood cell count and hemoglobin.  BMP which showed acute renal insufficiency compared to April.  GI pathogen and C. difficile panels were ordered but the GI panel was not resulted at time of discharge, C. difficile negative.  Medicines ordered:   Fentanyl which seemed to help with his pain only needed 1 dose  Zofran IV which was for nausea and he was able to tolerate p.o. fluids and solids after that  Cipro for presumed bacterial infection  2 L of lactated Ringer's for dehydration  Imaging Studies ordered:   I independently visualized and interpreted imaging CT stone study done because he had renal insufficiency cannot do IV contrast that showed a incidental abdominal aortic aneurysm which I discussed at length with him and his daughter on the phone to make sure that they knew they need to call vascular surgery to get follow-up for that and to return here for any passing out, worsening pain  or worsening back pain.  The rest of his CT was relatively unremarkable specifically not showing any cause for her symptoms here.    Additional history obtained:   Additional history obtained from daughter  Previous  records obtained and reviewed in epic  Consultations Obtained:   I consulted no one and discussed lab and imaging findings  Reevaluation:  After the interventions stated above, I reevaluated the patient and found that his pain was much better and he was tolerating p.o. pretty well.  His heart rate improved significantly as well.  He was not nearly as dry as he was previously.  He looks nontoxic and at this time I think he stable for discharge.  He will still need follow-up with his PCP to recheck his kidney function and he knows that.  A medical screening exam was performed and I feel the patient has had an appropriate workup for their chief complaint at this time and likelihood of emergent condition existing is low. They have been counseled on decision, discharge, follow up and which symptoms necessitate immediate return to the emergency department. They or their family verbally stated understanding and agreement with plan and discharged in stable condition.   ____________________________________________  FINAL CLINICAL IMPRESSION(S) / ED DIAGNOSES  Final diagnoses:  Abdominal aortic aneurysm (AAA) without rupture (HCC)  Diarrhea of infectious origin  Acute renal insufficiency    MEDICATIONS GIVEN DURING THIS VISIT:  Medications  sodium chloride flush (NS) 0.9 % injection 3 mL (has no administration in time range)  ondansetron (ZOFRAN-ODT) disintegrating tablet 4 mg (4 mg Oral Given 06/20/20 2320)  lactated ringers bolus 2,000 mL (0 mLs Intravenous Stopped 06/21/20 0557)  fentaNYL (SUBLIMAZE) injection 50 mcg (50 mcg Intravenous Given 06/21/20 0218)  ondansetron (ZOFRAN) injection 4 mg (4 mg Intravenous Given 06/21/20 0218)  ciprofloxacin (CIPRO) IVPB 400 mg (0 mg  Intravenous Stopped 06/21/20 0432)    NEW OUTPATIENT MEDICATIONS STARTED DURING THIS VISIT:  New Prescriptions   CIPROFLOXACIN (CIPRO) 500 MG TABLET    Take 1 tablet (500 mg total) by mouth 2 (two) times daily.   ONDANSETRON (ZOFRAN) 4 MG TABLET    Take 1 tablet (4 mg total) by mouth every 8 (eight) hours as needed for nausea or vomiting.   OXYCODONE-ACETAMINOPHEN (PERCOCET) 5-325 MG TABLET    Take 1 tablet by mouth every 6 (six) hours as needed for severe pain.    Note:  This note was prepared with assistance of Dragon voice recognition software. Occasional wrong-word or sound-a-like substitutions may have occurred due to the inherent limitations of voice recognition software.   Jojo Pehl, Corene Cornea, MD 06/21/20 [phone]

## 2020-06-22 LAB — GASTROINTESTINAL PANEL BY PCR, STOOL (REPLACES STOOL CULTURE)
Adenovirus F40/41: NOT DETECTED
Astrovirus: NOT DETECTED
Campylobacter species: DETECTED — AB
Cryptosporidium: NOT DETECTED
Cyclospora cayetanensis: NOT DETECTED
Entamoeba histolytica: NOT DETECTED
Enteroaggregative E coli (EAEC): NOT DETECTED
Enteropathogenic E coli (EPEC): NOT DETECTED
Enterotoxigenic E coli (ETEC): DETECTED — AB
Giardia lamblia: NOT DETECTED
Norovirus GI/GII: NOT DETECTED
Plesimonas shigelloides: NOT DETECTED
Rotavirus A: NOT DETECTED
Salmonella species: NOT DETECTED
Sapovirus (I, II, IV, and V): NOT DETECTED
Shiga like toxin producing E coli (STEC): NOT DETECTED
Shigella/Enteroinvasive E coli (EIEC): NOT DETECTED
Vibrio cholerae: NOT DETECTED
Vibrio species: NOT DETECTED
Yersinia enterocolitica: NOT DETECTED

## 2020-06-22 LAB — URINE CULTURE

## 2020-07-15 ENCOUNTER — Ambulatory Visit (INDEPENDENT_AMBULATORY_CARE_PROVIDER_SITE_OTHER): Payer: Medicaid Other | Admitting: *Deleted

## 2020-07-15 ENCOUNTER — Telehealth: Payer: Self-pay

## 2020-07-15 ENCOUNTER — Ambulatory Visit
Admission: RE | Admit: 2020-07-15 | Discharge: 2020-07-15 | Disposition: A | Discharge status: Home / Self Care | Payer: Medicaid Other | Source: Ambulatory Visit | Attending: Physician Assistant | Admitting: Physician Assistant

## 2020-07-15 ENCOUNTER — Other Ambulatory Visit: Payer: Self-pay

## 2020-07-15 VITALS — BP 160/95 | HR 72 | Temp 98.2°F | Resp 18

## 2020-07-15 DIAGNOSIS — Z95810 Presence of automatic (implantable) cardiac defibrillator: Secondary | ICD-10-CM

## 2020-07-15 DIAGNOSIS — R110 Nausea: Secondary | ICD-10-CM

## 2020-07-15 DIAGNOSIS — R197 Diarrhea, unspecified: Secondary | ICD-10-CM | POA: Diagnosis not present

## 2020-07-15 LAB — CUP PACEART REMOTE DEVICE CHECK
Battery Remaining Percentage: 52 %
Battery Voltage: 3.11 V
Brady Statistic RV Percent Paced: 0 %
Date Time Interrogation Session: 20210812074614
HighPow Impedance: 87 Ohm
Implantable Lead Implant Date: 20150515
Implantable Lead Location: 753860
Implantable Lead Model: 365
Implantable Lead Serial Number: 10579264
Implantable Pulse Generator Implant Date: 20150515
Lead Channel Impedance Value: 608 Ohm
Lead Channel Sensing Intrinsic Amplitude: 20 mV
Lead Channel Sensing Intrinsic Amplitude: 7.6 mV
Lead Channel Setting Pacing Amplitude: 2.5 V
Lead Channel Setting Pacing Pulse Width: 0.4 ms
Lead Channel Setting Sensing Sensitivity: 0.8 mV
Pulse Gen Model: 383594
Pulse Gen Serial Number: 60800912

## 2020-07-15 LAB — CBC WITH DIFFERENTIAL/PLATELET
Basophils Absolute: 0.1 10*3/uL (ref 0.0–0.2)
Basos: 1 %
EOS (ABSOLUTE): 0.4 10*3/uL (ref 0.0–0.4)
Eos: 4 %
Hematocrit: 44.9 % (ref 37.5–51.0)
Hemoglobin: 15.5 g/dL (ref 13.0–17.7)
Immature Grans (Abs): 0 10*3/uL (ref 0.0–0.1)
Immature Granulocytes: 0 %
Lymphocytes Absolute: 3.7 10*3/uL — ABNORMAL HIGH (ref 0.7–3.1)
Lymphs: 38 %
MCH: 30.2 pg (ref 26.6–33.0)
MCHC: 34.5 g/dL (ref 31.5–35.7)
MCV: 87 fL (ref 79–97)
Monocytes Absolute: 0.7 10*3/uL (ref 0.1–0.9)
Monocytes: 7 %
Neutrophils Absolute: 4.8 10*3/uL (ref 1.4–7.0)
Neutrophils: 50 %
Platelets: 285 10*3/uL (ref 150–450)
RBC: 5.14 x10E6/uL (ref 4.14–5.80)
RDW: 13.6 % (ref 11.6–15.4)
WBC: 9.6 10*3/uL (ref 3.4–10.8)

## 2020-07-15 LAB — BASIC METABOLIC PANEL
BUN/Creatinine Ratio: 8 — ABNORMAL LOW (ref 10–24)
BUN: 7 mg/dL — ABNORMAL LOW (ref 8–27)
CO2: 21 mmol/L (ref 20–29)
Calcium: 8.8 mg/dL (ref 8.6–10.2)
Chloride: 102 mmol/L (ref 96–106)
Creatinine, Ser: 0.91 mg/dL (ref 0.76–1.27)
GFR calc Af Amer: 103 mL/min/{1.73_m2} (ref >59)
GFR calc non Af Amer: 89 mL/min/{1.73_m2} (ref >59)
Glucose: 79 mg/dL (ref 65–99)
Potassium: 4.2 mmol/L (ref 3.5–5.2)
Sodium: 138 mmol/L (ref 134–144)

## 2020-07-15 MED ORDER — DICYCLOMINE HCL 20 MG PO TABS
20.0000 mg | ORAL_TABLET | Freq: Two times a day (BID) | ORAL | 0 refills | Status: DC
Start: 2020-07-15 — End: 2021-01-03

## 2020-07-15 MED ORDER — ONDANSETRON 4 MG PO TBDP: 4.0000 mg | ORAL_TABLET | Freq: Three times a day (TID) | ORAL | 0 refills | Status: DC | PRN

## 2020-07-15 NOTE — ED Provider Notes (Addendum)
[facility]    CSN: [id number] Arrival date & time: 07/15/20  1237      History   Chief Complaint Chief Complaint  Patient presents with  . appt 1 - diarrhea    HPI Kerry Mason is a 64 y.o. male.   64 year old male comes in for 2-day history of nausea, diarrhea.  He was seen at the ED 06/20/2020 for same symptoms.  States symptoms completely resolved prior to current symptom onset.  At that time, he was tested positive for E. coli and Campylobacter, and was put on ciprofloxacin.  During lab testing, creatinine was found to be elevated at 1.77.  Creatinine 3 months ago 0.85.  States since symptoms started, has had 10+ episodes of watery diarrhea, without obvious melena, hematochezia.  Nausea without vomiting.  Some abdominal discomfort.  T-max 100.1. unknown last colonoscopy.      Past Medical History:  Diagnosis Date  . Acute systolic CHF (congestive heart failure) ([place])   . AICD (automatic cardioverter/defibrillator) present   . Anxiety   . Ascending aortic aneurysm ([place]) 08/31/2017  . Back pain 08/31/2017  . Basal cell carcinoma    left arm  . CAD (coronary artery disease)    a. 2000: s/p stent of RCA 2000 with BMS  b. 2015: STEMI s/p LHC with old occlusion of RCA and DESx2 to LAD  c. 6/0/63: complicated PCI on 3/2 for CTO of mid RCA with coronary perforation and cardiac tamponade requiring emergent pericardiocentesis     . Cardiac tamponade    a. 02/03/15 2/2 coronary perforation during CTO procedure. Sealed with graftmaster coated stent.   Marland Kitchen COPD (chronic obstructive pulmonary disease) ([place]) 08/31/2017  . Gout   . History of pneumonia   . HTN (hypertension)   . Hyperlipidemia   . Ischemic cardiomyopathy    a. 2D ECHO: EF 25-30%. Akinesis of the anteroseptal and  . Left main coronary artery disease   . Myocardial infarction ([place])   . Obesity   . 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 Shortness of  breath dyspnea   . Tobacco abuse   . Urinary frequency     Patient Active Problem List   Diagnosis Date Noted  . Urinary frequency   . Obesity   . Myocardial infarction ([place])   . Ischemic cardiomyopathy   . History of pneumonia   . Cardiac tamponade   . CAD (coronary artery disease)   . Basal cell carcinoma   . AICD (automatic cardioverter/defibrillator) present   . Acute systolic CHF (congestive heart failure) ([place])   . Back pain 08/31/2017  . Ascending aortic aneurysm ([place]) 08/31/2017  . COPD (chronic obstructive pulmonary disease) ([place]) 08/31/2017  . Coronary artery disease involving native coronary artery of native heart with angina pectoris ([place])   . 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 CAD, multiple vessel 08/23/2015  . Left main coronary artery disease   . Unstable angina ([place])   . NSTEMI (non-ST elevated myocardial infarction) ([place])   . Chest pain 06/13/2015  . PAF- Amiodarone stopped, holding NSR 02/18/2015  . ISR RCA DES- plan medical Rx 02/08/2015  . CAD S/P multiple PCI- last March 0160 to RCA complicated by tamponade   . Anxiety   . Gout   . Pericardial effusion   . ICD in place 07/21/2014  . Chronic systolic CHF (congestive heart failure), NYHA class 3 ([place]) 12/14/2013  .  Cardiomyopathy, ischemic-EF 25% 12/14/2013  . HTN (hypertension) 12/14/2013  . Hyperlipidemia 12/14/2013  . Tobacco abuse 12/11/2013    Past Surgical History:  Procedure Laterality Date  . CARDIAC CATHETERIZATION    . CARDIAC CATHETERIZATION N/A 06/14/2015   Procedure: Left Heart Cath and Coronary Angiography;  Surgeon: Lorretta Harp, MD;  Location: [place] [facility];  Service: Cardiovascular;  Laterality: N/A;  . CARDIAC CATHETERIZATION N/A 08/18/2015   Procedure: Intravascular Pressure Wire/FFR Study;  Surgeon: Peter M Martinique, MD;  Location: [place] [facility];  Service: Cardiovascular;  Laterality: N/A;  . COLONOSCOPY    . CORONARY ANGIOPLASTY  2000  . CORONARY ANGIOPLASTY WITH STENT  PLACEMENT  2015  . CORONARY ARTERY BYPASS GRAFT N/A 09/02/2015   Procedure: CORONARY ARTERY BYPASS GRAFTING (CABG) x 2 (LIMA-LAD, SVG-Intermediate) ENDOSCOPIC GREATER SAPHENOUS VEIN HARVEST RIGHT THIGH;  Surgeon: Rexene Alberts, MD;  Location: [place];  Service: Open Heart Surgery;  Laterality: N/A;  . FOOT SURGERY    . IMPLANTABLE CARDIOVERTER DEFIBRILLATOR IMPLANT N/A 04/17/2014   Procedure: IMPLANTABLE CARDIOVERTER DEFIBRILLATOR IMPLANT;  Surgeon: Evans Lance, MD;  Location: [facility] CATH LAB;  Service: Cardiovascular;  Laterality: N/A;  . LEFT HEART CATH AND CORS/GRAFTS ANGIOGRAPHY N/A 08/30/2017   Procedure: LEFT HEART CATH AND CORS/GRAFTS ANGIOGRAPHY;  Surgeon: Nelva Bush, MD;  Location: [place] [facility];  Service: Cardiovascular;  Laterality: N/A;  . LEFT HEART CATHETERIZATION WITH CORONARY ANGIOGRAM N/A 12/11/2013   Procedure: LEFT HEART CATHETERIZATION WITH CORONARY ANGIOGRAM;  Surgeon: Peter M Martinique, MD;  Location: [facility] CATH LAB;  Service: Cardiovascular;  Laterality: N/A;  . LEFT HEART CATHETERIZATION WITH CORONARY ANGIOGRAM N/A 01/05/2015   Procedure: LEFT HEART CATHETERIZATION WITH CORONARY ANGIOGRAM;  Surgeon: Peter M Martinique, MD;  Location: [facility] CATH LAB;  Service: Cardiovascular;  Laterality: N/A;  . PERCUTANEOUS CORONARY STENT INTERVENTION (PCI-S)  12/11/2013   Procedure: PERCUTANEOUS CORONARY STENT INTERVENTION (PCI-S);  Surgeon: Peter M Martinique, MD;  Location: [facility] CATH LAB;  Service: Cardiovascular;;  prov LAD and mid LAD  . PERCUTANEOUS CORONARY STENT INTERVENTION (PCI-S) N/A 02/03/2015   Procedure: PERCUTANEOUS CORONARY STENT INTERVENTION (PCI-S);  Surgeon: Jettie Booze, MD;  Location: [facility] CATH LAB;  Service: Cardiovascular;  Laterality: N/A;  . TEE WITHOUT CARDIOVERSION N/A 09/02/2015   Procedure: TRANSESOPHAGEAL ECHOCARDIOGRAM (TEE);  Surgeon: Rexene Alberts, MD;  Location: [place];  Service: Open Heart Surgery;  Laterality: N/A;       Home Medications    Prior to Admission  medications   Medication Sig Start Date End Date Taking? Authorizing Provider  acetaminophen (TYLENOL) 325 MG tablet Take 2 tablets (650 mg total) by mouth every 4 (four) hours as needed for headache or mild pain. 06/16/15   Erlene Quan, PA-C  allopurinol (ZYLOPRIM) 300 MG tablet Take 1 tablet (300 mg total) by mouth daily. 03/05/19   Martinique, Peter M, MD  aspirin EC 81 MG tablet Take 81 mg by mouth daily.    [provider]  atorvastatin (LIPITOR) 80 MG tablet Take 1 tablet (80 mg total) by mouth daily. 03/18/20 06/21/20  Martinique, Peter M, MD  carvedilol (COREG) 6.25 MG tablet Take 1 tablet (6.25 mg total) by mouth 2 (two) times daily. 03/23/20   Evans Lance, MD  dicyclomine (BENTYL) 20 MG tablet Take 1 tablet (20 mg total) by mouth 2 (two) times daily. 07/15/20   Tasia Catchings, Aala Ransom V, PA-C  furosemide (LASIX) 40 MG tablet Take 1 tablet (40 mg total) by mouth daily. 03/18/20  Martinique, Peter M, MD  isosorbide mononitrate (IMDUR) 30 MG 24 hr tablet Take 1 tablet (30 mg total) by mouth daily. 03/18/20   Martinique, Peter M, MD  lisinopril (ZESTRIL) 5 MG tablet Take 1 tablet (5 mg total) by mouth daily. 03/18/20   Martinique, Peter M, MD  nitroGLYCERIN (NITROSTAT) 0.4 MG SL tablet DISSOLVE ONE TABLET UNDER THE TONGUE EVERY 5 MINUTES AS NEEDED FOR CHEST PAIN.  DO NOT EXCEED A TOTAL OF 3 DOSES IN 15 MINUTES 03/18/20   Martinique, Peter M, MD  ondansetron [facility] ODT) 4 MG disintegrating tablet Take 1 tablet (4 mg total) by mouth every 8 (eight) hours as needed for nausea or vomiting. 07/15/20   Tasia Catchings, Jacklyn Branan V, PA-C  sildenafil (VIAGRA) 50 MG tablet Take 1 tablet (50 mg total) by mouth daily as needed for erectile dysfunction. 08/21/18   Martinique, Peter M, MD  Spacer/Aero-Holding Chambers (AEROCHAMBER MV) inhaler Use as instructed 10/03/17   Magdalen Spatz, NP  spironolactone (ALDACTONE) 25 MG tablet Take 1 tablet (25 mg total) by mouth at bedtime. Patient taking differently: Take 25 mg by mouth daily after breakfast.  03/18/20    Martinique, Peter M, MD  budesonide-formoterol [facility]) 160-4.5 MCG/ACT inhaler Inhale 2 puffs into the lungs 2 (two) times daily. Patient not taking: Reported on 06/21/2020 09/05/17 06/21/20  Chesley Mires, MD  tiotropium (SPIRIVA) 18 MCG inhalation capsule Place 1 capsule (18 mcg total) into inhaler and inhale daily. Patient not taking: Reported on 06/21/2020 09/01/17 06/21/20  Isaiah Serge, NP    Family History Family History  Problem Relation Age of Onset  . CAD Father        PTCA  . Cancer Mother        LYMPHOMA    Social History Social History   Tobacco Use  . Smoking status: Current Every Day Smoker    Packs/day: 0.50    Years: 35.00    Pack years: 17.50    Types: Cigarettes  . Smokeless tobacco: Never Used  . Tobacco comment: pt states he is currently smoking 3-5 cigarettes daily 10/03/17  Vaping Use  . Vaping Use: Never used  Substance Use Topics  . Alcohol use: No    Alcohol/week: 0.0 standard drinks  . Drug use: No     Allergies   Patient has no known allergies.   Review of Systems Review of Systems  Reason unable to perform ROS: See HPI as above.     Physical Exam Triage Vital Signs ED Triage Vitals  Enc Vitals Group     BP 07/15/20 1319 (!) 160/95     Pulse Rate 07/15/20 1319 72     Resp 07/15/20 1319 18     Temp 07/15/20 1319 98.2 F (36.8 C)     Temp Source 07/15/20 1319 Oral     SpO2 07/15/20 1319 95 %     Weight --      Height --      Head Circumference --      Peak Flow --      Pain Score 07/15/20 1320 0     Pain Loc --      Pain Edu? --      Excl. in [place]? --    No data found.  Updated Vital Signs BP (!) 160/95 (BP Location: Left Arm)   Pulse 72   Temp 98.2 F (36.8 C) (Oral)   Resp 18   SpO2 95%   Physical Exam Constitutional:  General: He is not in acute distress.    Appearance: Normal appearance. He is not ill-appearing, toxic-appearing or diaphoretic.  HENT:     Head: Normocephalic and atraumatic.  Cardiovascular:       Rate and Rhythm: Normal rate and regular rhythm.  Pulmonary:     Effort: Pulmonary effort is normal. No respiratory distress.     Comments: LCTAB Abdominal:     General: Bowel sounds are increased.     Palpations: Abdomen is soft.     Tenderness: There is no abdominal tenderness. There is no right CVA tenderness, left CVA tenderness, guarding or rebound.  Musculoskeletal:     Cervical back: Normal range of motion and neck supple.  Skin:    General: Skin is warm and dry.  Neurological:     Mental Status: He is alert and oriented to person, place, and time.      UC Treatments / Results  Labs (all labs ordered are listed, but only abnormal results are displayed) Labs Reviewed  CBC WITH DIFFERENTIAL/PLATELET - Abnormal; Notable for the following components:      Result Value   Lymphocytes Absolute 3.7 (*)    All other components within normal limits   Narrative:    Performed at:  [address] [place], [place]  [id number] Lab Director: Rush Farmer MD, Phone:  [phone]  BASIC METABOLIC PANEL - Abnormal; Notable for the following components:   BUN 7 (*)    BUN/Creatinine Ratio 8 (*)    All other components within normal limits   Narrative:    Performed at:  [address] [place], [place]  [id number] Lab Director: Rush Farmer MD, Phone:  [phone]  GASTROINTESTINAL PANEL BY PCR, STOOL (REPLACES STOOL CULTURE)  C DIFFICILE QUICK SCREEN W PCR REFLEX    EKG   Radiology CUP PACEART REMOTE DEVICE CHECK  Result Date: 07/15/2020 Scheduled remote reviewed. Normal device function.  Next remote 91 days. Kathy Breach, RN, CCDS, [employer]   Procedures Procedures (including critical care time)  Medications Ordered in UC Medications - No data to display  Initial Impression / Assessment and Plan / UC Course  I have reviewed the triage vital signs and the nursing notes.  Pertinent labs & imaging results  that were available during my care of the patient were reviewed by me and considered in my medical decision making (see chart for details).    No alarming signs on exam.  Patient afebrile, nontoxic in appearance.  Able to ambulate on own without difficulty.  Abdomen soft, +BS, nontender to palpation. Patient does not currently have PCP, and creatinine has not been checked since discharge from ED.  Will recheck CBC, BMP. Given abx use, will check for c diff. Will also recheck stool sample. zofran and bentyl for symptomatic management. Push fluids. Return precautions given.  BMP with creatinine back to within normal limits.  WBC within normal limits.  Patient was unable to provide stool sample in office, stool kit was provided for patient to take home.  Will await stool testing for further management.  Otherwise if symptoms resolves with symptomatic management, we will not require stool sample. RN contacted patient of current results and plan. Please see RN notes.   Final Clinical Impressions(s) / UC Diagnoses   Final diagnoses:  Diarrhea, unspecified type  Nausea without vomiting    ED Prescriptions    Medication Sig Dispense Auth. Provider   dicyclomine (BENTYL) 20 MG tablet  Take 1 tablet (20 mg total) by mouth 2 (two) times daily. 20 tablet Alexyia Guarino V, PA-C   ondansetron (ZOFRAN ODT) 4 MG disintegrating tablet Take 1 tablet (4 mg total) by mouth every 8 (eight) hours as needed for nausea or vomiting. 20 tablet Ok Edwards, PA-C     PDMP not reviewed this encounter.   Ok Edwards, PA-C 07/15/20 [place], Demarri Elie V, PA-C 07/15/20 1725

## 2020-07-15 NOTE — ED Triage Notes (Signed)
Pt c/o diarrhea and nausea since last night. States had the same thing 2wks ago and was dx'd with e-coli

## 2020-07-15 NOTE — Discharge Instructions (Addendum)
CBC, BMP, stool samples ordered. Zofran for nausea and vomiting as needed. Bentyl for diarrhea. Keep hydrated, you urine should be clear to pale yellow in color. Bland diet, advance as tolerated. Monitor for any worsening of symptoms, nausea or vomiting not controlled by medication, worsening abdominal pain, fever, go to the emergency department for further evaluation needed. Otherwise, we will give you a call with lab results.

## 2020-07-16 ENCOUNTER — Other Ambulatory Visit: Payer: Self-pay | Admitting: *Deleted

## 2020-07-16 ENCOUNTER — Ambulatory Visit (INDEPENDENT_AMBULATORY_CARE_PROVIDER_SITE_OTHER): Payer: Medicaid Other | Admitting: Vascular Surgery

## 2020-07-16 ENCOUNTER — Encounter: Payer: Self-pay | Admitting: Vascular Surgery

## 2020-07-16 ENCOUNTER — Ambulatory Visit (HOSPITAL_COMMUNITY)
Admission: RE | Admit: 2020-07-16 | Discharge: 2020-07-16 | Disposition: A | Discharge status: Home / Self Care | Payer: Medicaid Other | Source: Ambulatory Visit | Attending: Vascular Surgery | Admitting: Vascular Surgery

## 2020-07-16 VITALS — BP 132/84 | HR 66 | Temp 98.5°F | Ht 73.0 in | Wt 214.8 lb

## 2020-07-16 DIAGNOSIS — I714 Abdominal aortic aneurysm, without rupture, unspecified: Secondary | ICD-10-CM

## 2020-07-16 NOTE — Progress Notes (Signed)
Patient ID: Kerry Mason, male   DOB: 1956/07/14, 64 y.o.   MRN: [id number]  Reason for Consult: New Patient (Initial Visit)   Referred by No ref. provider found  Subjective:     HPI:  Kerry Mason is a 64 y.o. male recently evaluated for kidney stone found to have an abdominal aortic aneurysm.  He does not have any previous knowledge of having an aneurysm or previous vascular surgical interventions.  He does have a history of coronary artery disease also had cardiac tamponade in the past.  States he does not have any new back or abdominal pain now.  No known family history of aneurysm disease.  Has never had any major abdominal surgery.  He is a current every day smoker.  Past Medical History:  Diagnosis Date  . Acute systolic CHF (congestive heart failure) ([place])   . AICD (automatic cardioverter/defibrillator) present   . Anxiety   . Ascending aortic aneurysm ([place]) 08/31/2017  . Back pain 08/31/2017  . Basal cell carcinoma    left arm  . CAD (coronary artery disease)    a. 2000: s/p stent of RCA 2000 with BMS  b. 2015: STEMI s/p LHC with old occlusion of RCA and DESx2 to LAD  c. 06/09/79: complicated PCI on 3/2 for CTO of mid RCA with coronary perforation and cardiac tamponade requiring emergent pericardiocentesis     . Cardiac tamponade    a. 02/03/15 2/2 coronary perforation during CTO procedure. Sealed with graftmaster coated stent.   Marland Kitchen COPD (chronic obstructive pulmonary disease) ([place]) 08/31/2017  . Gout   . History of pneumonia   . HTN (hypertension)   . Hyperlipidemia   . Ischemic cardiomyopathy    a. 2D ECHO: EF 25-30%. Akinesis of the anteroseptal and  . Left main coronary artery disease   . Myocardial infarction ([place])   . Obesity   . 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 Shortness of breath dyspnea   . Tobacco abuse   . Urinary frequency    Family History  Problem Relation Age of Onset  . CAD Father         PTCA  . Cancer Mother        LYMPHOMA   Past Surgical History:  Procedure Laterality Date  . CARDIAC CATHETERIZATION    . CARDIAC CATHETERIZATION N/A 06/14/2015   Procedure: Left Heart Cath and Coronary Angiography;  Surgeon: Lorretta Harp, MD;  Location: [place] [facility];  Service: Cardiovascular;  Laterality: N/A;  . CARDIAC CATHETERIZATION N/A 08/18/2015   Procedure: Intravascular Pressure Wire/FFR Study;  Surgeon: Peter M Martinique, MD;  Location: [place] [facility];  Service: Cardiovascular;  Laterality: N/A;  . COLONOSCOPY    . CORONARY ANGIOPLASTY  2000  . CORONARY ANGIOPLASTY WITH STENT PLACEMENT  2015  . CORONARY ARTERY BYPASS GRAFT N/A 09/02/2015   Procedure: CORONARY ARTERY BYPASS GRAFTING (CABG) x 2 (LIMA-LAD, SVG-Intermediate) ENDOSCOPIC GREATER SAPHENOUS VEIN HARVEST RIGHT THIGH;  Surgeon: Rexene Alberts, MD;  Location: [place];  Service: Open Heart Surgery;  Laterality: N/A;  . FOOT SURGERY    . IMPLANTABLE CARDIOVERTER DEFIBRILLATOR IMPLANT N/A 04/17/2014   Procedure: IMPLANTABLE CARDIOVERTER DEFIBRILLATOR IMPLANT;  Surgeon: Evans Lance, MD;  Location: [facility] CATH LAB;  Service: Cardiovascular;  Laterality: N/A;  . LEFT HEART CATH AND CORS/GRAFTS ANGIOGRAPHY N/A 08/30/2017   Procedure: LEFT HEART CATH AND CORS/GRAFTS ANGIOGRAPHY;  Surgeon: Nelva Bush,  MD;  Location: [place] [facility];  Service: Cardiovascular;  Laterality: N/A;  . LEFT HEART CATHETERIZATION WITH CORONARY ANGIOGRAM N/A 12/11/2013   Procedure: LEFT HEART CATHETERIZATION WITH CORONARY ANGIOGRAM;  Surgeon: Peter M Martinique, MD;  Location: [facility] CATH LAB;  Service: Cardiovascular;  Laterality: N/A;  . LEFT HEART CATHETERIZATION WITH CORONARY ANGIOGRAM N/A 01/05/2015   Procedure: LEFT HEART CATHETERIZATION WITH CORONARY ANGIOGRAM;  Surgeon: Peter M Martinique, MD;  Location: [facility] CATH LAB;  Service: Cardiovascular;  Laterality: N/A;  . PERCUTANEOUS CORONARY STENT INTERVENTION (PCI-S)  12/11/2013   Procedure: PERCUTANEOUS  CORONARY STENT INTERVENTION (PCI-S);  Surgeon: Peter M Martinique, MD;  Location: [facility] CATH LAB;  Service: Cardiovascular;;  prov LAD and mid LAD  . PERCUTANEOUS CORONARY STENT INTERVENTION (PCI-S) N/A 02/03/2015   Procedure: PERCUTANEOUS CORONARY STENT INTERVENTION (PCI-S);  Surgeon: Jettie Booze, MD;  Location: [facility] CATH LAB;  Service: Cardiovascular;  Laterality: N/A;  . TEE WITHOUT CARDIOVERSION N/A 09/02/2015   Procedure: TRANSESOPHAGEAL ECHOCARDIOGRAM (TEE);  Surgeon: Rexene Alberts, MD;  Location: [place];  Service: Open Heart Surgery;  Laterality: N/A;    Short Social History:  Social History   Tobacco Use  . Smoking status: Current Every Day Smoker    Packs/day: 0.50    Years: 35.00    Pack years: 17.50    Types: Cigarettes  . Smokeless tobacco: Never Used  Substance Use Topics  . Alcohol use: No    Alcohol/week: 0.0 standard drinks    No Known Allergies  Current Outpatient Medications  Medication Sig Dispense Refill  . acetaminophen (TYLENOL) 325 MG tablet Take 2 tablets (650 mg total) by mouth every 4 (four) hours as needed for headache or mild pain.    Marland Kitchen allopurinol (ZYLOPRIM) 300 MG tablet Take 1 tablet (300 mg total) by mouth daily. 90 tablet 3  . aspirin EC 81 MG tablet Take 81 mg by mouth daily.    Marland Kitchen atorvastatin (LIPITOR) 80 MG tablet Take 1 tablet (80 mg total) by mouth daily. 90 tablet 3  . carvedilol (COREG) 6.25 MG tablet Take 1 tablet (6.25 mg total) by mouth 2 (two) times daily. 180 tablet 3  . dicyclomine (BENTYL) 20 MG tablet Take 1 tablet (20 mg total) by mouth 2 (two) times daily. 20 tablet 0  . furosemide (LASIX) 40 MG tablet Take 1 tablet (40 mg total) by mouth daily. 90 tablet 3  . isosorbide mononitrate (IMDUR) 30 MG 24 hr tablet Take 1 tablet (30 mg total) by mouth daily. 90 tablet 3  . lisinopril (ZESTRIL) 5 MG tablet Take 1 tablet (5 mg total) by mouth daily. 90 tablet 3  . nitroGLYCERIN (NITROSTAT) 0.4 MG SL tablet DISSOLVE ONE TABLET UNDER THE TONGUE  EVERY 5 MINUTES AS NEEDED FOR CHEST PAIN.  DO NOT EXCEED A TOTAL OF 3 DOSES IN 15 MINUTES 75 tablet 1  . ondansetron (ZOFRAN ODT) 4 MG disintegrating tablet Take 1 tablet (4 mg total) by mouth every 8 (eight) hours as needed for nausea or vomiting. 20 tablet 0  . sildenafil (VIAGRA) 50 MG tablet Take 1 tablet (50 mg total) by mouth daily as needed for erectile dysfunction. 10 tablet 3  . Spacer/Aero-Holding Chambers (AEROCHAMBER MV) inhaler Use as instructed 1 each 0  . spironolactone (ALDACTONE) 25 MG tablet Take 1 tablet (25 mg total) by mouth at bedtime. (Patient taking differently: Take 25 mg by mouth daily after breakfast. ) 90 tablet 3   No current facility-administered medications for this visit.  Review of Systems  Constitutional:  Constitutional negative. HENT: HENT negative.  Eyes: Eyes negative.  Respiratory: Positive for shortness of breath.  Cardiovascular: Positive for irregular heartbeat.  GI: Gastrointestinal negative.  Musculoskeletal: Musculoskeletal negative.  Skin: Skin negative.  Neurological: Neurological negative. Hematologic: Hematologic/lymphatic negative.  Psychiatric: Psychiatric negative.        Objective:  Objective   Vitals:   07/16/20 0956  BP: 132/84  Pulse: 66  Temp: 98.5 F (36.9 C)  TempSrc: Skin  SpO2: 94%  Weight: 214 lb 12.8 oz (97.4 kg)  Height: 6\' 1"  (1.854 m)   Body mass index is 28.34 kg/m.  Physical Exam Constitutional:      Appearance: He is obese.  HENT:     Head: Normocephalic.     Nose:     Comments: Wearing a mask Eyes:     Pupils: Pupils are equal, round, and reactive to light.  Cardiovascular:     Pulses:          Radial pulses are 2+ on the right side and 2+ on the left side.       Femoral pulses are 1+ on the right side and 1+ on the left side. Pulmonary:     Effort: Pulmonary effort is normal.  Abdominal:     General: Abdomen is flat.     Palpations: Abdomen is soft. There is no mass.  Musculoskeletal:         General: Normal range of motion.     Right lower leg: Edema present.     Left lower leg: Edema present.  Skin:    General: Skin is warm.     Capillary Refill: Capillary refill takes less than 2 seconds.  Neurological:     General: No focal deficit present.     Mental Status: He is alert.  Psychiatric:        Mood and Affect: Mood normal.        Behavior: Behavior normal.        Thought Content: Thought content normal.        Judgment: Judgment normal.     Data: I have independently interpreted his abdominal aortic duplex which demonstrates greatest aortic diameter 4.87 cm.  We reviewed his CT scan together which does not have contrast but appears to be amenable to endovascular repair with greatest diameter 4.9 cm.     Assessment/Plan:     64 year old male with 4.9 cm abdominal aortic aneurysm.  This was a new finding on noncontrasted CT of his abdomen.  I discussed with him the expected growth as well as expected course of action possible need for repair in the future when he reaches 5.5 cm.  Until that time we will follow with every 76-month abdominal aortic aneurysm duplex and when he has reached 5.5 cm we will get a CT with contrast to plan repair.  At this time he does appear to be a candidate for endovascular aneurysm repair.  We discussed the signs and symptoms of rupture and that he would need to seek urgent medical attention he demonstrates good understanding.  Otherwise we will follow him up in 6 months.    Waynetta Sandy MD [facility]

## 2020-07-19 NOTE — Progress Notes (Signed)
Remote ICD transmission.   

## 2020-07-21 ENCOUNTER — Other Ambulatory Visit: Payer: Self-pay | Admitting: *Deleted

## 2020-07-21 DIAGNOSIS — I714 Abdominal aortic aneurysm, without rupture, unspecified: Secondary | ICD-10-CM

## 2020-10-14 ENCOUNTER — Ambulatory Visit (INDEPENDENT_AMBULATORY_CARE_PROVIDER_SITE_OTHER): Payer: Medicaid Other

## 2020-10-14 DIAGNOSIS — I255 Ischemic cardiomyopathy: Secondary | ICD-10-CM | POA: Diagnosis not present

## 2020-10-14 LAB — CUP PACEART REMOTE DEVICE CHECK
Battery Remaining Percentage: 50 %
Battery Voltage: 3.11 V
Brady Statistic RV Percent Paced: 0 %
Date Time Interrogation Session: 20211111080109
HighPow Impedance: 86 Ohm
Implantable Lead Implant Date: 20150515
Implantable Lead Location: 753860
Implantable Lead Model: 365
Implantable Lead Serial Number: 10579264
Implantable Pulse Generator Implant Date: 20150515
Lead Channel Impedance Value: 594 Ohm
Lead Channel Sensing Intrinsic Amplitude: 20 mV
Lead Channel Sensing Intrinsic Amplitude: 5.3 mV
Lead Channel Setting Pacing Amplitude: 2.5 V
Lead Channel Setting Pacing Pulse Width: 0.4 ms
Lead Channel Setting Sensing Sensitivity: 0.8 mV
Pulse Gen Model: 383594
Pulse Gen Serial Number: 60800912

## 2020-10-18 NOTE — Progress Notes (Signed)
Remote ICD transmission.   

## 2020-12-27 ENCOUNTER — Telehealth: Payer: Self-pay

## 2020-12-27 NOTE — Telephone Encounter (Signed)
Biotronik alert received for AT event. Concerning for AF w/RVR? Possibly undersensing? Patient reports he has felt well overall other than recently dealing with gout. Is currently taking taking zpack (started 6 days ago). Compliant with all medications including coreg 6.25 mg BID, Lasix 40 mg daily. No OAC on file. Patient reports he was not aware of event, was laying in his bed during this time. Advised patient I will forward to Dr. [name] and we will call with changes. ED precautions provided.

## 2021-01-03 ENCOUNTER — Emergency Department (HOSPITAL_COMMUNITY): Payer: Medicaid Other

## 2021-01-03 ENCOUNTER — Inpatient Hospital Stay (HOSPITAL_COMMUNITY)
Admission: EM | Admit: 2021-01-03 | Discharge: 2021-01-05 | DRG: 287 | Disposition: A | Discharge status: Home / Self Care | Payer: Medicaid Other | Attending: Internal Medicine | Admitting: Internal Medicine

## 2021-01-03 ENCOUNTER — Other Ambulatory Visit: Payer: Self-pay

## 2021-01-03 ENCOUNTER — Encounter (HOSPITAL_COMMUNITY): Payer: Self-pay | Admitting: Emergency Medicine

## 2021-01-03 DIAGNOSIS — Z85828 Personal history of other malignant neoplasm of skin: Secondary | ICD-10-CM | POA: Diagnosis not present

## 2021-01-03 DIAGNOSIS — I480 Paroxysmal atrial fibrillation: Secondary | ICD-10-CM

## 2021-01-03 DIAGNOSIS — Z951 Presence of aortocoronary bypass graft: Secondary | ICD-10-CM | POA: Diagnosis not present

## 2021-01-03 DIAGNOSIS — I110 Hypertensive heart disease with heart failure: Secondary | ICD-10-CM | POA: Diagnosis present

## 2021-01-03 DIAGNOSIS — I5042 Chronic combined systolic (congestive) and diastolic (congestive) heart failure: Secondary | ICD-10-CM | POA: Diagnosis present

## 2021-01-03 DIAGNOSIS — I2582 Chronic total occlusion of coronary artery: Secondary | ICD-10-CM | POA: Diagnosis present

## 2021-01-03 DIAGNOSIS — Z8249 Family history of ischemic heart disease and other diseases of the circulatory system: Secondary | ICD-10-CM

## 2021-01-03 DIAGNOSIS — I25110 Atherosclerotic heart disease of native coronary artery with unstable angina pectoris: Secondary | ICD-10-CM | POA: Diagnosis present

## 2021-01-03 DIAGNOSIS — I252 Old myocardial infarction: Secondary | ICD-10-CM | POA: Diagnosis not present

## 2021-01-03 DIAGNOSIS — Z95810 Presence of automatic (implantable) cardiac defibrillator: Secondary | ICD-10-CM

## 2021-01-03 DIAGNOSIS — F17210 Nicotine dependence, cigarettes, uncomplicated: Secondary | ICD-10-CM | POA: Diagnosis present

## 2021-01-03 DIAGNOSIS — I200 Unstable angina: Secondary | ICD-10-CM | POA: Diagnosis not present

## 2021-01-03 DIAGNOSIS — Z7982 Long term (current) use of aspirin: Secondary | ICD-10-CM | POA: Diagnosis not present

## 2021-01-03 DIAGNOSIS — Z79899 Other long term (current) drug therapy: Secondary | ICD-10-CM | POA: Diagnosis not present

## 2021-01-03 DIAGNOSIS — E785 Hyperlipidemia, unspecified: Secondary | ICD-10-CM | POA: Diagnosis present

## 2021-01-03 DIAGNOSIS — J449 Chronic obstructive pulmonary disease, unspecified: Secondary | ICD-10-CM | POA: Diagnosis present

## 2021-01-03 DIAGNOSIS — I10 Essential (primary) hypertension: Secondary | ICD-10-CM | POA: Diagnosis present

## 2021-01-03 DIAGNOSIS — I248 Other forms of acute ischemic heart disease: Secondary | ICD-10-CM | POA: Diagnosis not present

## 2021-01-03 DIAGNOSIS — Z807 Family history of other malignant neoplasms of lymphoid, hematopoietic and related tissues: Secondary | ICD-10-CM

## 2021-01-03 DIAGNOSIS — Z955 Presence of coronary angioplasty implant and graft: Secondary | ICD-10-CM

## 2021-01-03 DIAGNOSIS — I255 Ischemic cardiomyopathy: Secondary | ICD-10-CM | POA: Diagnosis present

## 2021-01-03 DIAGNOSIS — Z20822 Contact with and (suspected) exposure to covid-19: Secondary | ICD-10-CM | POA: Diagnosis present

## 2021-01-03 DIAGNOSIS — Z720 Tobacco use: Secondary | ICD-10-CM | POA: Diagnosis present

## 2021-01-03 DIAGNOSIS — M109 Gout, unspecified: Secondary | ICD-10-CM | POA: Diagnosis present

## 2021-01-03 LAB — CBC
HCT: 49.1 % (ref 39.0–52.0)
Hemoglobin: 16.9 g/dL (ref 13.0–17.0)
MCH: 30.5 pg (ref 26.0–34.0)
MCHC: 34.4 g/dL (ref 30.0–36.0)
MCV: 88.6 fL (ref 80.0–100.0)
Platelets: 262 10*3/uL (ref 150–400)
RBC: 5.54 MIL/uL (ref 4.22–5.81)
RDW: 14.5 % (ref 11.5–15.5)
WBC: 15.2 10*3/uL — ABNORMAL HIGH (ref 4.0–10.5)
nRBC: 0 % (ref 0.0–0.2)

## 2021-01-03 LAB — BASIC METABOLIC PANEL
Anion gap: 11 (ref 5–15)
BUN: 14 mg/dL (ref 8–23)
CO2: 23 mmol/L (ref 22–32)
Calcium: 9.1 mg/dL (ref 8.9–10.3)
Chloride: 100 mmol/L (ref 98–111)
Creatinine, Ser: 0.98 mg/dL (ref 0.61–1.24)
GFR, Estimated: >60 mL/min (ref >60)
Glucose, Bld: 111 mg/dL — ABNORMAL HIGH (ref 70–99)
Potassium: 4 mmol/L (ref 3.5–5.1)
Sodium: 134 mmol/L — ABNORMAL LOW (ref 135–145)

## 2021-01-03 LAB — TROPONIN I (HIGH SENSITIVITY)
Troponin I (High Sensitivity): 20 ng/L — ABNORMAL HIGH (ref <18)
Troponin I (High Sensitivity): 23 ng/L — ABNORMAL HIGH (ref <18)

## 2021-01-03 MED ORDER — ONDANSETRON HCL 4 MG/2ML IJ SOLN: 4.0000 mg | Freq: Four times a day (QID) | INTRAMUSCULAR | Status: DC | PRN

## 2021-01-03 MED ORDER — ALLOPURINOL 300 MG PO TABS
300.0000 mg | ORAL_TABLET | Freq: Every day | ORAL | Status: DC
  Administered 2021-01-05: 300 mg via ORAL
  Filled 2021-01-03 (×2): qty 1

## 2021-01-03 MED ORDER — LISINOPRIL 5 MG PO TABS
5.0000 mg | ORAL_TABLET | Freq: Every day | ORAL | Status: DC
  Administered 2021-01-04 – 2021-01-05 (×2): 5 mg via ORAL
  Filled 2021-01-03 (×2): qty 1

## 2021-01-03 MED ORDER — ASPIRIN EC 81 MG PO TBEC: 81.0000 mg | DELAYED_RELEASE_TABLET | Freq: Every day | ORAL | Status: DC

## 2021-01-03 MED ORDER — ACETAMINOPHEN 325 MG PO TABS
650.0000 mg | ORAL_TABLET | ORAL | Status: DC | PRN
  Administered 2021-01-04 (×2): 650 mg via ORAL
  Filled 2021-01-03 (×2): qty 2

## 2021-01-03 MED ORDER — ATORVASTATIN CALCIUM 40 MG PO TABS
80.0000 mg | ORAL_TABLET | Freq: Every day | ORAL | Status: DC
  Filled 2021-01-03: qty 2

## 2021-01-03 MED ORDER — IOHEXOL 350 MG/ML SOLN
75.0000 mL | Freq: Once | INTRAVENOUS | Status: AC | PRN
  Administered 2021-01-03: 75 mL via INTRAVENOUS

## 2021-01-03 MED ORDER — CARVEDILOL 3.125 MG PO TABS: 6.2500 mg | ORAL_TABLET | Freq: Two times a day (BID) | ORAL | Status: DC

## 2021-01-03 MED ORDER — SPIRONOLACTONE 25 MG PO TABS
25.0000 mg | ORAL_TABLET | Freq: Every day | ORAL | Status: DC
  Administered 2021-01-04 (×2): 25 mg via ORAL
  Filled 2021-01-03 (×3): qty 1

## 2021-01-03 MED ORDER — ISOSORBIDE MONONITRATE ER 30 MG PO TB24
30.0000 mg | ORAL_TABLET | Freq: Every day | ORAL | Status: DC
  Filled 2021-01-03: qty 1

## 2021-01-03 MED ORDER — NITROGLYCERIN 0.4 MG SL SUBL: 0.4000 mg | SUBLINGUAL_TABLET | SUBLINGUAL | Status: DC | PRN

## 2021-01-03 MED ORDER — FUROSEMIDE 40 MG PO TABS
40.0000 mg | ORAL_TABLET | Freq: Every day | ORAL | Status: DC
  Administered 2021-01-05: 40 mg via ORAL
  Filled 2021-01-03: qty 1

## 2021-01-03 NOTE — Telephone Encounter (Signed)
Refer to atrial fib clinic. GT

## 2021-01-03 NOTE — ED Triage Notes (Signed)
Pt BIB [facility] from home, c/o chest pain and shortness of breath that started this morning. Hx MI with stents.

## 2021-01-03 NOTE — ED Provider Notes (Signed)
[facility] EMERGENCY DEPARTMENT Provider Note   CSN: [id number] Arrival date & time: 01/03/21  1527     History Chief Complaint  Patient presents with  . Chest Pain    Kerry Mason is a 65 y.o. male.  65 year old male with a significant past medical history to include PCI multiple times and four-vessel CABG 4 years ago.  He is medically managed also for coronary artery disease.  States he had some chest pain associated with shortness of breath, nausea and diaphoresis today.  No vomiting or lightheadedness.  Patient states that it was kind of a cramping pain in his bilateral chest and his anterior chest.  It seemed to get better with nitroglycerin.  Patient states he has had heart attacks in the past he has had less severe symptoms.  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 fib episodes according to his cardiologist.  He states that he is pain-free at this time.  No fever or cough.  Still smokes   Chest Pain Associated symptoms: diaphoresis, fatigue and nausea        Past Medical History:  Diagnosis Date  . Acute systolic CHF (congestive heart failure) ([place])   . AICD (automatic cardioverter/defibrillator) present   . Anxiety   . Ascending aortic aneurysm ([place]) 08/31/2017  . Back pain 08/31/2017  . Basal cell carcinoma    left arm  . CAD (coronary artery disease)    a. 2000: s/p stent of RCA 2000 with BMS  b. 2015: STEMI s/p LHC with old occlusion of RCA and DESx2 to LAD  c. 4/0/98: complicated PCI on 3/2 for CTO of mid RCA with coronary perforation and cardiac tamponade requiring emergent pericardiocentesis     . Cardiac tamponade    a. 02/03/15 2/2 coronary perforation during CTO procedure. Sealed with graftmaster coated stent.   Marland Kitchen COPD (chronic obstructive pulmonary disease) ([place]) 08/31/2017  . Gout   . History of pneumonia   . HTN (hypertension)   . Hyperlipidemia   . Ischemic cardiomyopathy    a. 2D ECHO: EF 25-30%.  Akinesis of the anteroseptal and  . Left main coronary artery disease   . Myocardial infarction ([place])   . Obesity   . 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 Shortness of breath dyspnea   . Tobacco abuse   . Urinary frequency     Patient Active Problem List   Diagnosis Date Noted  . Urinary frequency   . Obesity   . Myocardial infarction ([place])   . Ischemic cardiomyopathy   . History of pneumonia   . Cardiac tamponade   . CAD (coronary artery disease)   . Basal cell carcinoma   . AICD (automatic cardioverter/defibrillator) present   . Acute systolic CHF (congestive heart failure) ([place])   . Back pain 08/31/2017  . Ascending aortic aneurysm ([place]) 08/31/2017  . COPD (chronic obstructive pulmonary disease) ([place]) 08/31/2017  . Coronary artery disease involving native coronary artery of native heart with angina pectoris ([place])   . 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 CAD, multiple vessel 08/23/2015  . Left main coronary artery disease   . Unstable angina ([place])   . NSTEMI (non-ST elevated myocardial infarction) ([place])   . Chest pain 06/13/2015  . PAF- Amiodarone stopped, holding NSR 02/18/2015  . ISR RCA DES- plan medical Rx 02/08/2015  . CAD S/P multiple PCI- last March  [id number] to RCA complicated by tamponade   . Anxiety   . Gout   . Pericardial effusion   . ICD in place 07/21/2014  . Chronic systolic CHF (congestive heart failure), NYHA class 3 ([place]) 12/14/2013  . Cardiomyopathy, ischemic-EF 25% 12/14/2013  . HTN (hypertension) 12/14/2013  . Hyperlipidemia 12/14/2013  . Tobacco abuse 12/11/2013    Past Surgical History:  Procedure Laterality Date  . CARDIAC CATHETERIZATION    . CARDIAC CATHETERIZATION N/A 06/14/2015   Procedure: Left Heart Cath and Coronary Angiography;  Surgeon: Lorretta Harp, MD;  Location: [facility] CV LAB;  Service: Cardiovascular;  Laterality: N/A;  . CARDIAC CATHETERIZATION N/A 08/18/2015   Procedure:  Intravascular Pressure Wire/FFR Study;  Surgeon: Peter M Martinique, MD;  Location: [facility] CV LAB;  Service: Cardiovascular;  Laterality: N/A;  . COLONOSCOPY    . CORONARY ANGIOPLASTY  2000  . CORONARY ANGIOPLASTY WITH STENT PLACEMENT  2015  . CORONARY ARTERY BYPASS GRAFT N/A 09/02/2015   Procedure: CORONARY ARTERY BYPASS GRAFTING (CABG) x 2 (LIMA-LAD, SVG-Intermediate) ENDOSCOPIC GREATER SAPHENOUS VEIN HARVEST RIGHT THIGH;  Surgeon: Rexene Alberts, MD;  Location: [facility];  Service: Open Heart Surgery;  Laterality: N/A;  . FOOT SURGERY    . IMPLANTABLE CARDIOVERTER DEFIBRILLATOR IMPLANT N/A 04/17/2014   Procedure: IMPLANTABLE CARDIOVERTER DEFIBRILLATOR IMPLANT;  Surgeon: Evans Lance, MD;  Location[facility] CATH LAB;  Service: Cardiovascular;  Laterality: N/A;  . LEFT HEART CATH AND CORS/GRAFTS ANGIOGRAPHY N/A 08/30/2017   Procedure: LEFT HEART CATH AND CORS/GRAFTS ANGIOGRAPHY;  Surgeon: Nelva Bush, MD;  Location: [facility] CV LAB;  Service: Cardiovascular;  Laterality: N/A;  . LEFT HEART CATHETERIZATION WITH CORONARY ANGIOGRAM N/A 12/11/2013   Procedure: LEFT HEART CATHETERIZATION WITH CORONARY ANGIOGRAM;  Surgeon: Peter M Martinique, MD;  [facility] C[facility] LAB;  Service: Cardiovascular;  Laterality: N/A;  . LEFT HEART CATHETERIZATION WITH CORONARY ANGIOGRAM N/A 01/05/2015   Procedure: LEFT HEART CATHETERIZATION WITH CORONARY ANGIOGRAM;  Surgeon: Peter M Martinique, MD;  Location: [facility] CATH LAB;  Service: Cardiovascular;  Laterality: N/A;  . PERCUTANEOUS CORONARY STENT INTERVENTION (PCI-S)  12/11/2013   Procedure: PERCUTANEOUS CORONARY STENT INTERVENTION (PCI-S);  Surgeon: Peter M Martinique, MD;  Location: [facility] CATH LAB;  Service: Cardiovascular;;  prov LAD and mid LAD  . PERCUTANEOUS CORONARY STENT INTERVENTION (PCI-S) N/A 02/03/2015   Procedure: PERCUTANEOUS CORONARY STENT INTERVENTION (PCI-S);  Surgeon: Jettie Booze, MD;  Location: [facility] CATH LAB;  Service: Cardiovascular;  Laterality: N/A;  . TEE WITHOUT  CARDIOVERSION N/A 09/02/2015   Procedure: TRANSESOPHAGEAL ECHOCARDIOGRAM (TEE);  Surgeon: Rexene Alberts, MD;  Location: [facility];  Service: Open Heart Surgery;  Laterality: N/A;       Family History  Problem Relation Age of Onset  . CAD Father        PTCA  . Cancer Mother        LYMPHOMA    Social History   Tobacco Use  . Smoking status: Current Every Day Smoker    Packs/day: 0.50    Years: 35.00    Pack years: 17.50    Types: Cigarettes  . Smokeless tobacco: Never Used  Vaping Use  . Vaping Use: Never used  Substance Use Topics  . Alcohol use: No    Alcohol/week: 0.0 standard drinks  . Drug use: No    Home Medications Prior to Admission medications   Medication Sig Start Date End Date Taking? Authorizing Provider  acetaminophen (TYLENOL) 325 MG tablet Take 2 tablets (650 mg total) by mouth  every 4 (four) hours as needed for headache or mild pain. 06/16/15   Erlene Quan, PA-C  allopurinol (ZYLOPRIM) 300 MG tablet Take 1 tablet (300 mg total) by mouth daily. 03/05/19   Martinique, Peter M, MD  aspirin EC 81 MG tablet Take 81 mg by mouth daily.    [provider]  atorvastatin (LIPITOR) 80 MG tablet Take 1 tablet (80 mg total) by mouth daily. 03/18/20 06/21/20  Martinique, Peter M, MD  carvedilol (COREG) 6.25 MG tablet Take 1 tablet (6.25 mg total) by mouth 2 (two) times daily. 03/23/20   Evans Lance, MD  dicyclomine (BENTYL) 20 MG tablet Take 1 tablet (20 mg total) by mouth 2 (two) times daily. 07/15/20   Tasia Catchings, Amy V, PA-C  furosemide (LASIX) 40 MG tablet Take 1 tablet (40 mg total) by mouth daily. 03/18/20   Martinique, Peter M, MD  isosorbide mononitrate (IMDUR) 30 MG 24 hr tablet Take 1 tablet (30 mg total) by mouth daily. 03/18/20   Martinique, Peter M, MD  lisinopril (ZESTRIL) 5 MG tablet Take 1 tablet (5 mg total) by mouth daily. 03/18/20   Martinique, Peter M, MD  nitroGLYCERIN (NITROSTAT) 0.4 MG SL tablet DISSOLVE ONE TABLET UNDER THE TONGUE EVERY 5 MINUTES AS NEEDED FOR CHEST PAIN.   DO NOT EXCEED A TOTAL OF 3 DOSES IN 15 MINUTES 03/18/20   Martinique, Peter M, MD  ondansetron (ZOFRAN ODT) 4 MG disintegrating tablet Take 1 tablet (4 mg total) by mouth every 8 (eight) hours as needed for nausea or vomiting. 07/15/20   Tasia Catchings, Amy V, PA-C  predniSONE (STERAPRED UNI-PAK 48 TAB) 10 MG (48) TBPK tablet Take by mouth as directed. 12/21/20   [provider]  sildenafil (VIAGRA) 50 MG tablet Take 1 tablet (50 mg total) by mouth daily as needed for erectile dysfunction. 08/21/18   Martinique, Peter M, MD  Spacer/Aero-Holding Chambers (AEROCHAMBER MV) inhaler Use as instructed 10/03/17   Magdalen Spatz, NP  spironolactone (ALDACTONE) 25 MG tablet Take 1 tablet (25 mg total) by mouth at bedtime. Patient taking differently: Take 25 mg by mouth daily after breakfast.  03/18/20   Martinique, Peter M, MD  budesonide-formoterol [facility]) 160-4.5 MCG/ACT inhaler Inhale 2 puffs into the lungs 2 (two) times daily. Patient not taking: Reported on 06/21/2020 09/05/17 06/21/20  Chesley Mires, MD  tiotropium (SPIRIVA) 18 MCG inhalation capsule Place 1 capsule (18 mcg total) into inhaler and inhale daily. Patient not taking: Reported on 06/21/2020 09/01/17 06/21/20  Isaiah Serge, NP    Allergies    Patient has no known allergies.  Review of Systems   Review of Systems  Constitutional: Positive for diaphoresis and fatigue.  Cardiovascular: Positive for chest pain.  Gastrointestinal: Positive for nausea.  All other systems reviewed and are negative.   Physical Exam Updated Vital Signs BP 127/88 (BP Location: Right Arm)   Pulse 73   Temp 98.3 F (36.8 C) (Oral)   Resp 17   Ht 6\' 1"  (1.854 m)   Wt 97.5 kg   SpO2 96%   BMI 28.37 kg/m   Physical Exam Vitals and nursing note reviewed.  Constitutional:      Appearance: He is well-developed and well-nourished.  HENT:     Head: Normocephalic and atraumatic.     Mouth/Throat:     Mouth: Mucous membranes are moist.     Pharynx: Oropharynx is clear.   Eyes:     Pupils: Pupils are equal, round, and reactive to light.  Cardiovascular:     Rate and Rhythm: Normal rate.  Pulmonary:     Effort: Pulmonary effort is normal. No respiratory distress.  Abdominal:     General: There is no distension or abdominal bruit.     Palpations: Abdomen is soft. There is no fluid wave, hepatomegaly or splenomegaly.  Musculoskeletal:        General: Normal range of motion.     Cervical back: Normal range of motion.     Right lower leg: No tenderness.     Left lower leg: No tenderness.  Skin:    General: Skin is warm and dry.  Neurological:     General: No focal deficit present.     Mental Status: He is alert.  Psychiatric:        Mood and Affect: Mood normal.     ED Results / Procedures / Treatments   Labs (all labs ordered are listed, but only abnormal results are displayed) Labs Reviewed  BASIC METABOLIC PANEL - Abnormal; Notable for the following components:      Result Value   Sodium 134 (*)    Glucose, Bld 111 (*)    All other components within normal limits  CBC - Abnormal; Notable for the following components:   WBC 15.2 (*)    All other components within normal limits  TROPONIN I (HIGH SENSITIVITY) - Abnormal; Notable for the following components:   Troponin I (High Sensitivity) 20 (*)    All other components within normal limits  TROPONIN I (HIGH SENSITIVITY) - Abnormal; Notable for the following components:   Troponin I (High Sensitivity) 23 (*)    All other components within normal limits    EKG EKG Interpretation  Date/Time:  Monday January 03 2021 15:20:07 EST Ventricular Rate:  85 PR Interval:  128 QRS Duration: 94 QT Interval:  338 QTC Calculation: 402 R Axis:   102 Text Interpretation: Normal sinus rhythm Rightward axis Possible Anteroseptal infarct , age undetermined Abnormal ECG No significant change since last tracing Confirmed by Merrily Pew [phone]) on 01/03/2021 5:28:18 PM   Radiology DG Chest 2  View  Result Date: 01/03/2021 CLINICAL DATA:  Chest pain and shortness of breath, onset this morning. EXAM: CHEST - 2 VIEW COMPARISON:  Most recent radiograph 08/29/2017.  Chest CT 09/10/2018 FINDINGS: Right-sided pacemaker in place. Median sternotomy. Stable hyperinflation and interstitial coarsening consistent with COPD. Normal heart size with stable mediastinal contours. Coronary artery calcifications/stents. No focal airspace disease. No pneumothorax or pleural effusion. No pulmonary edema. No acute osseous abnormalities are seen. IMPRESSION: 1. No acute chest findings. 2. COPD. Electronically Signed   By: Keith Rake M.D.   On: 01/03/2021 16:05   CT ANGIO CHEST AORTA W/CM & OR WO/CM  Result Date: 01/03/2021 CLINICAL DATA:  Chest pain EXAM: CT ANGIOGRAPHY CHEST WITH CONTRAST TECHNIQUE: Multidetector CT imaging of the chest was performed using the standard protocol during bolus administration of intravenous contrast. Multiplanar CT image reconstructions and MIPs were obtained to evaluate the vascular anatomy. CONTRAST:  44mL OMNIPAQUE IOHEXOL 350 MG/ML SOLN COMPARISON:  09/10/2018 CT, chest x-ray from earlier in the same day. FINDINGS: Cardiovascular: Mild dilatation of the ascending aorta to 4.1 cm is noted. Sinus of Valsalva measures approximately 4.3 cm. Sino-tubular junction measures approximately 3.1 cm. No dissection is identified. Coronary calcifications and stenting is noted. Pulmonary artery is within normal limits as visualized. Changes of prior coronary bypass grafting are seen. Defibrillator is noted. Mediastinum/Nodes: Thoracic inlet is within normal limits. Scattered small  mediastinal lymph nodes are noted but not significant by size criteria. These are likely reactive in nature. The esophagus as visualized is within normal limits. Lungs/Pleura: Lungs are well aerated bilaterally. Diffuse emphysematous changes are noted. No focal infiltrate or sizable effusion is seen. No parenchymal  nodules are noted. Upper Abdomen: Visualized upper abdomen is unremarkable with the exception of a right renal cyst. Musculoskeletal: Degenerative change of the thoracic spine is seen. No acute bony abnormality is noted. Review of the MIP images confirms the above findings. IMPRESSION: Mild dilatation of the ascending aorta as described. No dissection is noted. Recommend annual imaging followup by CTA or MRA. This recommendation follows 2010 ACCF/AHA/AATS/ACR/ASA/SCA/SCAI/SIR/STS/SVM Guidelines for the Diagnosis and Management of Patients with Thoracic Aortic Disease. Circulation. 2010; 121: Z610-R604. Aortic aneurysm NOS (ICD10-I71.9) Changes of prior coronary bypass grafting. No acute abnormality in the lungs. Aortic Atherosclerosis (ICD10-I70.0) and Emphysema (ICD10-J43.9). Electronically Signed   By: Inez Catalina M.D.   On: 01/03/2021 21:50    Procedures Procedures   Medications Ordered in ED Medications  iohexol (OMNIPAQUE) 350 MG/ML injection 75 mL (75 mLs Intravenous Contrast Given 01/03/21 2143)    ED Course  I have reviewed the triage vital signs and the nursing notes.  Pertinent labs & imaging results that were available during my care of the patient were reviewed by me and considered in my medical decision making (see chart for details).    MDM Rules/Calculators/A&P                         High concern for coronary disease without another obvious cause for symptoms.  Will wait for second troponin discussed with cardiology. secondt roponin unremarkable. Ct scan without significant change in aorta, doubt this as the cause. Cardiology consulted, will eval and discuss options for disposition.   Final Clinical Impression(s) / ED Diagnoses Final diagnoses:  None    Rx / DC Orders ED Discharge Orders    None       Artavis Cowie, Corene Cornea, MD 01/04/21 1351

## 2021-01-04 ENCOUNTER — Encounter (HOSPITAL_COMMUNITY)
Admission: EM | Disposition: A | Discharge status: Home / Self Care | Payer: Self-pay | Source: Home / Self Care | Attending: Internal Medicine

## 2021-01-04 ENCOUNTER — Inpatient Hospital Stay (HOSPITAL_COMMUNITY): Payer: Medicaid Other

## 2021-01-04 DIAGNOSIS — I248 Other forms of acute ischemic heart disease: Secondary | ICD-10-CM

## 2021-01-04 DIAGNOSIS — I200 Unstable angina: Secondary | ICD-10-CM

## 2021-01-04 DIAGNOSIS — I5042 Chronic combined systolic (congestive) and diastolic (congestive) heart failure: Secondary | ICD-10-CM

## 2021-01-04 DIAGNOSIS — I25110 Atherosclerotic heart disease of native coronary artery with unstable angina pectoris: Principal | ICD-10-CM

## 2021-01-04 HISTORY — PX: CORONARY BALLOON ANGIOPLASTY: CATH118233

## 2021-01-04 HISTORY — PX: LEFT HEART CATH AND CORONARY ANGIOGRAPHY: CATH118249

## 2021-01-04 LAB — LIPID PANEL
Cholesterol: 181 mg/dL (ref 0–200)
HDL: 42 mg/dL (ref >40)
LDL Cholesterol: 105 mg/dL — ABNORMAL HIGH (ref 0–99)
Total CHOL/HDL Ratio: 4.3 RATIO
Triglycerides: 169 mg/dL — ABNORMAL HIGH (ref <150)
VLDL: 34 mg/dL (ref 0–40)

## 2021-01-04 LAB — CBC
HCT: 48.7 % (ref 39.0–52.0)
Hemoglobin: 16.5 g/dL (ref 13.0–17.0)
MCH: 30.2 pg (ref 26.0–34.0)
MCHC: 33.9 g/dL (ref 30.0–36.0)
MCV: 89.2 fL (ref 80.0–100.0)
Platelets: 275 10*3/uL (ref 150–400)
RBC: 5.46 MIL/uL (ref 4.22–5.81)
RDW: 14.7 % (ref 11.5–15.5)
WBC: 16.3 10*3/uL — ABNORMAL HIGH (ref 4.0–10.5)
nRBC: 0 % (ref 0.0–0.2)

## 2021-01-04 LAB — BASIC METABOLIC PANEL
Anion gap: 11 (ref 5–15)
BUN: 16 mg/dL (ref 8–23)
CO2: 23 mmol/L (ref 22–32)
Calcium: 9 mg/dL (ref 8.9–10.3)
Chloride: 100 mmol/L (ref 98–111)
Creatinine, Ser: 0.91 mg/dL (ref 0.61–1.24)
GFR, Estimated: >60 mL/min (ref >60)
Glucose, Bld: 91 mg/dL (ref 70–99)
Potassium: 4 mmol/L (ref 3.5–5.1)
Sodium: 134 mmol/L — ABNORMAL LOW (ref 135–145)

## 2021-01-04 LAB — POCT ACTIVATED CLOTTING TIME
Activated Clotting Time: 315 seconds
Activated Clotting Time: 386 seconds

## 2021-01-04 LAB — ECHOCARDIOGRAM COMPLETE
Area-P 1/2: 2.34 cm2
Calc EF: 36.3 %
Height: 73 in
S' Lateral: 6.3 cm
Single Plane A2C EF: 24.4 %
Single Plane A4C EF: 45.1 %
Weight: 3440 oz

## 2021-01-04 LAB — SARS CORONAVIRUS 2 (TAT 6-24 HRS): SARS Coronavirus 2: NEGATIVE

## 2021-01-04 LAB — HEPARIN LEVEL (UNFRACTIONATED): Heparin Unfractionated: <0.1 IU/mL — ABNORMAL LOW (ref 0.30–0.70)

## 2021-01-04 LAB — PROTIME-INR
INR: 1.1 (ref 0.8–1.2)
Prothrombin Time: 13.5 seconds (ref 11.4–15.2)

## 2021-01-04 LAB — HIV ANTIBODY (ROUTINE TESTING W REFLEX): HIV Screen 4th Generation wRfx: NONREACTIVE

## 2021-01-04 SURGERY — LEFT HEART CATH AND CORONARY ANGIOGRAPHY
Anesthesia: LOCAL

## 2021-01-04 MED ORDER — SODIUM CHLORIDE 0.9 % WEIGHT BASED INFUSION: 3.0000 mL/kg/h | INTRAVENOUS | Status: DC

## 2021-01-04 MED ORDER — HYDRALAZINE HCL 20 MG/ML IJ SOLN: 10.0000 mg | INTRAMUSCULAR | Status: AC | PRN

## 2021-01-04 MED ORDER — FENTANYL CITRATE (PF) 100 MCG/2ML IJ SOLN
INTRAMUSCULAR | Status: DC | PRN
  Administered 2021-01-04: 25 ug via INTRAVENOUS

## 2021-01-04 MED ORDER — HEPARIN (PORCINE) IN NACL 1000-0.9 UT/500ML-% IV SOLN
INTRAVENOUS | Status: DC | PRN
  Administered 2021-01-04 (×2): 500 mL

## 2021-01-04 MED ORDER — ACETAMINOPHEN 325 MG PO TABS: 650.0000 mg | ORAL_TABLET | ORAL | Status: DC | PRN

## 2021-01-04 MED ORDER — MIDAZOLAM HCL 2 MG/2ML IJ SOLN
INTRAMUSCULAR | Status: DC | PRN
  Administered 2021-01-04: 2 mg via INTRAVENOUS

## 2021-01-04 MED ORDER — SODIUM CHLORIDE 0.9 % WEIGHT BASED INFUSION: 1.0000 mL/kg/h | INTRAVENOUS | Status: DC

## 2021-01-04 MED ORDER — HEPARIN (PORCINE) 25000 UT/250ML-% IV SOLN
1400.0000 [IU]/h | INTRAVENOUS | Status: DC
  Administered 2021-01-04: 1200 [IU]/h via INTRAVENOUS
  Filled 2021-01-04 (×2): qty 250

## 2021-01-04 MED ORDER — CARVEDILOL 6.25 MG PO TABS
6.2500 mg | ORAL_TABLET | Freq: Two times a day (BID) | ORAL | Status: DC
  Administered 2021-01-04 – 2021-01-05 (×3): 6.25 mg via ORAL
  Filled 2021-01-04: qty 1
  Filled 2021-01-04 (×2): qty 2
  Filled 2021-01-04: qty 1

## 2021-01-04 MED ORDER — LIDOCAINE HCL (PF) 1 % IJ SOLN
INTRAMUSCULAR | Status: AC
  Filled 2021-01-04: qty 30

## 2021-01-04 MED ORDER — SODIUM CHLORIDE 0.9% FLUSH: 3.0000 mL | INTRAVENOUS | Status: DC | PRN

## 2021-01-04 MED ORDER — FENTANYL CITRATE (PF) 100 MCG/2ML IJ SOLN
INTRAMUSCULAR | Status: AC
  Filled 2021-01-04: qty 2

## 2021-01-04 MED ORDER — VERAPAMIL HCL 2.5 MG/ML IV SOLN
INTRAVENOUS | Status: AC
  Filled 2021-01-04: qty 2

## 2021-01-04 MED ORDER — HEPARIN (PORCINE) 25000 UT/250ML-% IV SOLN
1200.0000 [IU]/h | INTRAVENOUS | Status: DC
  Administered 2021-01-04: 1200 [IU]/h via INTRAVENOUS
  Filled 2021-01-04 (×3): qty 250

## 2021-01-04 MED ORDER — HEPARIN SODIUM (PORCINE) 1000 UNIT/ML IJ SOLN
INTRAMUSCULAR | Status: AC
  Filled 2021-01-04: qty 1

## 2021-01-04 MED ORDER — HEPARIN (PORCINE) IN NACL 1000-0.9 UT/500ML-% IV SOLN
INTRAVENOUS | Status: AC
  Filled 2021-01-04: qty 1000

## 2021-01-04 MED ORDER — MIDAZOLAM HCL 2 MG/2ML IJ SOLN
INTRAMUSCULAR | Status: AC
  Filled 2021-01-04: qty 2

## 2021-01-04 MED ORDER — SODIUM CHLORIDE 0.9% FLUSH
3.0000 mL | Freq: Two times a day (BID) | INTRAVENOUS | Status: DC
  Administered 2021-01-04 – 2021-01-05 (×3): 3 mL via INTRAVENOUS

## 2021-01-04 MED ORDER — SODIUM CHLORIDE 0.9 % IV SOLN: INTRAVENOUS | Status: AC

## 2021-01-04 MED ORDER — ATORVASTATIN CALCIUM 80 MG PO TABS
80.0000 mg | ORAL_TABLET | Freq: Every day | ORAL | Status: DC
  Administered 2021-01-04 – 2021-01-05 (×3): 80 mg via ORAL
  Filled 2021-01-04: qty 2
  Filled 2021-01-04: qty 1

## 2021-01-04 MED ORDER — VERAPAMIL HCL 2.5 MG/ML IV SOLN
INTRAVENOUS | Status: DC | PRN
  Administered 2021-01-04: 10 mL via INTRA_ARTERIAL

## 2021-01-04 MED ORDER — SODIUM CHLORIDE 0.9 % IV SOLN: 250.0000 mL | INTRAVENOUS | Status: DC | PRN

## 2021-01-04 MED ORDER — LABETALOL HCL 5 MG/ML IV SOLN: 10.0000 mg | INTRAVENOUS | Status: AC | PRN

## 2021-01-04 MED ORDER — NITROGLYCERIN 1 MG/10 ML FOR IR/CATH LAB
INTRA_ARTERIAL | Status: AC
  Filled 2021-01-04: qty 10

## 2021-01-04 MED ORDER — CLOPIDOGREL BISULFATE 300 MG PO TABS
ORAL_TABLET | ORAL | Status: DC | PRN
  Administered 2021-01-04: 600 mg via ORAL

## 2021-01-04 MED ORDER — LIDOCAINE HCL (PF) 1 % IJ SOLN
INTRAMUSCULAR | Status: DC | PRN
  Administered 2021-01-04: 2 mL

## 2021-01-04 MED ORDER — ASPIRIN EC 81 MG PO TBEC
81.0000 mg | DELAYED_RELEASE_TABLET | Freq: Every day | ORAL | Status: DC
Start: 2021-01-05 — End: 2021-01-05
  Administered 2021-01-05: 81 mg via ORAL
  Filled 2021-01-04: qty 1

## 2021-01-04 MED ORDER — ONDANSETRON HCL 4 MG/2ML IJ SOLN: 4.0000 mg | Freq: Four times a day (QID) | INTRAMUSCULAR | Status: DC | PRN

## 2021-01-04 MED ORDER — CLOPIDOGREL BISULFATE 300 MG PO TABS
ORAL_TABLET | ORAL | Status: AC
  Filled 2021-01-04: qty 1

## 2021-01-04 MED ORDER — HEPARIN SODIUM (PORCINE) 1000 UNIT/ML IJ SOLN
INTRAMUSCULAR | Status: DC | PRN
  Administered 2021-01-04: 5000 [IU] via INTRAVENOUS
  Administered 2021-01-04: 2000 [IU] via INTRAVENOUS
  Administered 2021-01-04: 6000 [IU] via INTRAVENOUS

## 2021-01-04 MED ORDER — IOHEXOL 350 MG/ML SOLN
INTRAVENOUS | Status: DC | PRN
  Administered 2021-01-04: 130 mL

## 2021-01-04 MED ORDER — PERFLUTREN LIPID MICROSPHERE
1.0000 mL | INTRAVENOUS | Status: AC | PRN
  Administered 2021-01-04: 2 mL via INTRAVENOUS
  Filled 2021-01-04: qty 10

## 2021-01-04 MED ORDER — FENTANYL CITRATE (PF) 100 MCG/2ML IJ SOLN
INTRAMUSCULAR | Status: DC | PRN
  Administered 2021-01-04: 50 ug via INTRAVENOUS

## 2021-01-04 MED ORDER — ISOSORBIDE MONONITRATE ER 30 MG PO TB24
30.0000 mg | ORAL_TABLET | Freq: Every day | ORAL | Status: DC
  Administered 2021-01-04 – 2021-01-05 (×3): 30 mg via ORAL
  Filled 2021-01-04 (×2): qty 1

## 2021-01-04 MED ORDER — HEPARIN BOLUS VIA INFUSION
4000.0000 [IU] | Freq: Once | INTRAVENOUS | Status: AC
  Administered 2021-01-04: 4000 [IU] via INTRAVENOUS
  Filled 2021-01-04: qty 4000

## 2021-01-04 MED ORDER — ASPIRIN 81 MG PO CHEW
81.0000 mg | CHEWABLE_TABLET | ORAL | Status: AC
  Administered 2021-01-04: 81 mg via ORAL
  Filled 2021-01-04: qty 1

## 2021-01-04 SURGICAL SUPPLY — 22 items
BALLN SAPPHIRE 2.0X15 (BALLOONS) ×2
BALLN SAPPHIRE 2.5X15 (BALLOONS) ×2
BALLOON SAPPHIRE 2.0X15 (BALLOONS) IMPLANT
BALLOON SAPPHIRE 2.5X15 (BALLOONS) IMPLANT
CATH INFINITI 5 FR IM (CATHETERS) ×1 IMPLANT
CATH INFINITI 5FR JL5 (CATHETERS) ×1 IMPLANT
CATH INFINITI JR4 5F (CATHETERS) ×1 IMPLANT
CATH LAUNCHER 6FR AL2 (CATHETERS) IMPLANT
CATHETER LAUNCHER 6FR AL2 (CATHETERS) ×2
CATHETER LAUNCHER 8F EBU4.0 (CATHETERS) ×1 IMPLANT
DEVICE RAD COMP TR BAND LRG (VASCULAR PRODUCTS) ×1 IMPLANT
GLIDESHEATH SLEND SS 6F .021 (SHEATH) ×1 IMPLANT
GUIDEWIRE INQWIRE 1.5J.035X260 (WIRE) IMPLANT
INQWIRE 1.5J .035X260CM (WIRE) ×2
KIT ENCORE 26 ADVANTAGE (KITS) ×1 IMPLANT
KIT HEART LEFT (KITS) ×2 IMPLANT
KIT HEMO VALVE WATCHDOG (MISCELLANEOUS) ×1 IMPLANT
PACK CARDIAC CATHETERIZATION (CUSTOM PROCEDURE TRAY) ×2 IMPLANT
TRANSDUCER W/STOPCOCK (MISCELLANEOUS) ×2 IMPLANT
WIRE ASAHI PROWATER 180CM (WIRE) ×2 IMPLANT
WIRE FIGHTER CROSSING 190CM (WIRE) ×1 IMPLANT
WIRE HI TORQ BMW 190CM (WIRE) ×1 IMPLANT

## 2021-01-04 NOTE — Progress Notes (Signed)
ANTICOAGULATION CONSULT NOTE - Initial Consult  Pharmacy Consult for Heparin Indication: chest pain/ACS  No Known Allergies  Patient Measurements: Height: 6\' 1"  (185.4 cm) Weight: 97.5 kg (215 lb) IBW/kg (Calculated) : 79.9  Vital Signs: Temp: 98.2 F (36.8 C) (02/01 0254) Temp Source: Oral (02/01 0254) BP: 117/76 (02/01 0254) Pulse Rate: 72 (02/01 0254)  Labs: Recent Labs    01/03/21 1533 01/03/21 1951 01/04/21 0236  HGB 16.9  --  16.5  HCT 49.1  --  48.7  PLT 262  --  275  LABPROT  --   --  13.5  INR  --   --  1.1  CREATININE 0.98  --  0.91  TROPONINIHS 20* 23*  --     Estimated Creatinine Clearance: 100.8 mL/min (by C-G formula based on SCr of 0.91 mg/dL).   Medical History: Past Medical History:  Diagnosis Date  . Acute systolic CHF (congestive heart failure) ([place])   . AICD (automatic cardioverter/defibrillator) present   . Anxiety   . Ascending aortic aneurysm ([place]) 08/31/2017  . Back pain 08/31/2017  . Basal cell carcinoma    left arm  . CAD (coronary artery disease)    a. 2000: s/p stent of RCA 2000 with BMS  b. 2015: STEMI s/p LHC with old occlusion of RCA and DESx2 to LAD  c. 05/05/82: complicated PCI on 3/2 for CTO of mid RCA with coronary perforation and cardiac tamponade requiring emergent pericardiocentesis     . Cardiac tamponade    a. 02/03/15 2/2 coronary perforation during CTO procedure. Sealed with graftmaster coated stent.   Marland Kitchen COPD (chronic obstructive pulmonary disease) ([place]) 08/31/2017  . Gout   . History of pneumonia   . HTN (hypertension)   . Hyperlipidemia   . Ischemic cardiomyopathy    a. 2D ECHO: EF 25-30%. Akinesis of the anteroseptal and  . Left main coronary artery disease   . Myocardial infarction ([place])   . Obesity   . 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 Shortness of breath dyspnea   . Tobacco abuse   . Urinary frequency    Assessment: 65 y/o M with significant  cardiac history presents tot he ED with CP. Starting heparin. Troponin low/flat. CBC/renal function good. PTA meds reviewed.   Goal of Therapy:  Heparin level 0.3-0.7 units/ml Monitor platelets by anticoagulation protocol: Yes   Plan:  Heparin 4000 units BOLUS Start heparin drip at 1200 units/hr 0900 heparin level Daily CBC/heparin level Monitor for bleeding  Narda Bonds, PharmD, BCPS Clinical Pharmacist Phone: [phone]

## 2021-01-04 NOTE — Progress Notes (Signed)
  Echocardiogram 2D Echocardiogram with definity has been performed.  Darlina Sicilian M 01/04/2021, 9:03 AM

## 2021-01-04 NOTE — H&P (Signed)
Cardiology History & Physical    Patient ID: Kerry Mason MRN: [id number], DOB/AGE: 1955-12-18   Admit date: 01/03/2021  Primary Physician: Kathyrn Lass Primary Cardiologist: Peter Martinique, MD  Patient Profile   Kerry Mason is a 65 y.o. male with CAD s/p CABG and multiple PCI, chronic systolic HF s/p ICD, HLD and tobacco abuse who presents with chest pain that recurred today after initial relief with nitroglycerin.  History of Present Illness   65 year old male with complex CV history of CAD s/p CABG, HFrEF, ICM s/p ICD as below who presents with chest pain.  He reports that he rarely has any episodes of chest pain or requires nitroglycerin.  Today he was awakened by a squeezing bilateral pressure like pain and took two nitroglycerin.  The pain resolved and he went to work at [employer] where he did well until mid morning nad subsequently developed chest pain/pressure again so he decided to present to the emergency department.  He is chest pain free at this time after a third dose of nitroglycerin.  No fevers, chills, nausea, vomiting, shortness of breath, orthopnea, PND, or palpitations.  He hasn't noticed any worsening swelling.  He feels like this is definitely related to his heart and could be due to recurrent ischemia.  I discussed options with him including wait and see approach or expedited cardiac catheterization.  Given high risk coronary history he would like to proceed with graft angiography which I agreed with him is reasonable.  Of note, he was recently informed by Dr. Tanna Furry office that his DC-ICD idenitifed AF with subsqeuent referral.  He's not been started on anticoagulant.  Past Medical History   Pertinent prior CV history PCI RCA unclear context 2000 lost to follow-up Anterior STEMI 2015 w/ occluded pRCA and collateralization, PCI to pLAD LVEF 35% and underwent ICD placement 2015. Subsequent nuclear perfusion w/ large anterior, anteroseptal, apical scar and  inferior ischemia w/ LVEF 25% CTO RCA attent 2015 c/b mRCA perforation requiring emergent pericardiocentesis, sealed w/ graftmaster covered to [facility] and 4 DES 2016 NSTEMI w/ reoccluded mRCA and 50% LM, referred for CABG w/ LIMA --> LAD, SVG --> Ramus 2018 w/ repeat chest pain, LHC w/ patent LIMA to LAD ans SVG to ramus, ? Mediastinal mass?  Past Medical History:  Diagnosis Date  . Acute systolic CHF (congestive heart failure) ([place])   . AICD (automatic cardioverter/defibrillator) present   . Anxiety   . Ascending aortic aneurysm ([place]) 08/31/2017  . Back pain 08/31/2017  . Basal cell carcinoma    left arm  . CAD (coronary artery disease)    a. 2000: s/p stent of RCA 2000 with BMS  b. 2015: STEMI s/p LHC with old occlusion of RCA and DESx2 to LAD  c. [id number]: complicated PCI on 3/2 for CTO of mid RCA with coronary perforation and cardiac tamponade requiring emergent pericardiocentesis     . Cardiac tamponade    a. 02/03/15 2/2 coronary perforation during CTO procedure. Sealed with graftmaster coated stent.   Marland Kitchen COPD (chronic obstructive pulmonary disease) ([place]) 08/31/2017  . Gout   . History of pneumonia   . HTN (hypertension)   . Hyperlipidemia   . Ischemic cardiomyopathy    a. 2D ECHO: EF 25-30%. Akinesis of the anteroseptal and  . Left main coronary artery disease   . Myocardial infarction ([place])   . Obesity   . 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 Shortness of breath dyspnea   . Tobacco abuse   . Urinary frequency     Past Surgical History:  Procedure Laterality Date  . CARDIAC CATHETERIZATION    . CARDIAC CATHETERIZATION N/A 06/14/2015   Procedure: Left Heart Cath and Coronary Angiography;  Surgeon: Lorretta Harp, MD;  Location: [place] CV LAB;  Service: Cardiovascular;  Laterality: N/A;  . CARDIAC CATHETERIZATION N/A 08/18/2015   Procedure: Intravascular Pressure Wire/FFR Study;  Surgeon: Peter M Martinique, MD;  Location:  [place] CV LAB;  Service: Cardiovascular;  Laterality: N/A;  . COLONOSCOPY    . CORONARY ANGIOPLASTY  2000  . CORONARY ANGIOPLASTY WITH STENT PLACEMENT  2015  . CORONARY ARTERY BYPASS GRAFT N/A 09/02/2015   Procedure: CORONARY ARTERY BYPASS GRAFTING (CABG) x 2 (LIMA-LAD, SVG-Intermediate) ENDOSCOPIC GREATER SAPHENOUS VEIN HARVEST RIGHT THIGH;  Surgeon: Rexene Alberts, MD;  Location: [place];  Service: Open Heart Surgery;  Laterality: N/A;  . FOOT SURGERY    . IMPLANTABLE CARDIOVERTER DEFIBRILLATOR IMPLANT N/A 04/17/2014   Procedure: IMPLANTABLE CARDIOVERTER DEFIBRILLATOR IMPLANT;  Surgeon: Evans Lance, MD;  Location: [facility] CATH LAB;  Service: Cardiovascular;  Laterality: N/A;  . LEFT HEART CATH AND CORS/GRAFTS ANGIOGRAPHY N/A 08/30/2017   Procedure: LEFT HEART CATH AND CORS/GRAFTS ANGIOGRAPHY;  Surgeon: Nelva Bush, MD;  Location: [place] CV LAB;  Service: Cardiovascular;  Laterality: N/A;  . LEFT HEART CATHETERIZATION WITH CORONARY ANGIOGRAM N/A 12/11/2013   Procedure: LEFT HEART CATHETERIZATION WITH CORONARY ANGIOGRAM;  Surgeon: Peter M Martinique, MD;  Location: [facility] CATH LAB;  Service: Cardiovascular;  Laterality: N/A;  . LEFT HEART CATHETERIZATION WITH CORONARY ANGIOGRAM N/A 01/05/2015   Procedure: LEFT HEART CATHETERIZATION WITH CORONARY ANGIOGRAM;  Surgeon: Peter M Martinique, MD;  Location: [facility] CATH LAB;  Service: Cardiovascular;  Laterality: N/A;  . PERCUTANEOUS CORONARY STENT INTERVENTION (PCI-S)  12/11/2013   Procedure: PERCUTANEOUS CORONARY STENT INTERVENTION (PCI-S);  Surgeon: Peter M Martinique, MD;  Location: [facility] CATH LAB;  Service: Cardiovascular;;  prov LAD and mid LAD  . PERCUTANEOUS CORONARY STENT INTERVENTION (PCI-S) N/A 02/03/2015   Procedure: PERCUTANEOUS CORONARY STENT INTERVENTION (PCI-S);  Surgeon: Jettie Booze, MD;  Location: [facility] CATH LAB;  Service: Cardiovascular;  Laterality: N/A;  . TEE WITHOUT CARDIOVERSION N/A 09/02/2015   Procedure: TRANSESOPHAGEAL ECHOCARDIOGRAM (TEE);   Surgeon: Rexene Alberts, MD;  Location: [place];  Service: Open Heart Surgery;  Laterality: N/A;     Allergies No Known Allergies  Home Medications    Prior to Admission medications   Medication Sig Start Date End Date Taking? Authorizing Provider  allopurinol (ZYLOPRIM) 300 MG tablet Take 1 tablet (300 mg total) by mouth daily. 03/05/19  Yes Martinique, Peter M, MD  aspirin EC 81 MG tablet Take 81 mg by mouth daily.   Yes [provider]  carvedilol (COREG) 6.25 MG tablet Take 1 tablet (6.25 mg total) by mouth 2 (two) times daily. 03/23/20  Yes Evans Lance, MD  furosemide (LASIX) 40 MG tablet Take 1 tablet (40 mg total) by mouth daily. 03/18/20  Yes Martinique, Peter M, MD  isosorbide mononitrate (IMDUR) 30 MG 24 hr tablet Take 1 tablet (30 mg total) by mouth daily. 03/18/20  Yes Martinique, Peter M, MD  lisinopril (ZESTRIL) 5 MG tablet Take 1 tablet (5 mg total) by mouth daily. 03/18/20  Yes Martinique, Peter M, MD  nitroGLYCERIN (NITROSTAT) 0.4 MG SL tablet DISSOLVE ONE TABLET UNDER THE TONGUE EVERY 5 MINUTES AS NEEDED FOR CHEST PAIN.  DO  NOT EXCEED A TOTAL OF 3 DOSES IN 15 MINUTES 03/18/20  Yes Martinique, Peter M, MD  predniSONE (STERAPRED UNI-PAK 48 TAB) 10 MG (48) TBPK tablet Take by mouth as directed. 12/21/20  Yes [provider]  spironolactone (ALDACTONE) 25 MG tablet Take 1 tablet (25 mg total) by mouth at bedtime. Patient taking differently: Take 25 mg by mouth daily after breakfast. 03/18/20  Yes Martinique, Peter M, MD  acetaminophen (TYLENOL) 325 MG tablet Take 2 tablets (650 mg total) by mouth every 4 (four) hours as needed for headache or mild pain. 06/16/15   Erlene Quan, PA-C  atorvastatin (LIPITOR) 80 MG tablet Take 1 tablet (80 mg total) by mouth daily. 03/18/20 06/21/20  Martinique, Peter M, MD  sildenafil (VIAGRA) 50 MG tablet Take 1 tablet (50 mg total) by mouth daily as needed for erectile dysfunction. 08/21/18   Martinique, Peter M, MD  Spacer/Aero-Holding Chambers (AEROCHAMBER MV)  inhaler Use as instructed 10/03/17   Magdalen Spatz, NP  budesonide-formoterol [facility]) 160-4.5 MCG/ACT inhaler Inhale 2 puffs into the lungs 2 (two) times daily. Patient not taking: Reported on 06/21/2020 09/05/17 06/21/20  Chesley Mires, MD  tiotropium (SPIRIVA) 18 MCG inhalation capsule Place 1 capsule (18 mcg total) into inhaler and inhale daily. Patient not taking: Reported on 06/21/2020 09/01/17 06/21/20  Isaiah Serge, NP    Family History    Family History  Problem Relation Age of Onset  . CAD Father        PTCA  . Cancer Mother        LYMPHOMA   He indicated that his mother is deceased. He indicated that his father is deceased. He indicated that his maternal grandmother is deceased. He indicated that his maternal grandfather is deceased. He indicated that his paternal grandmother is deceased. He indicated that his paternal grandfather is deceased.   Social History    Social History   Socioeconomic History  . Marital status: Married    Spouse name: Not on file  . Number of children: 2  . Years of education: Not on file  . Highest education level: Not on file  Occupational History  . 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 Smoking status: Current Every Day Smoker    Packs/day: 0.50    Years: 35.00    Pack years: 17.50    Types: Cigarettes  . Smokeless tobacco: Never Used  Vaping Use  . Vaping Use: Never used  Substance and Sexual Activity  . Alcohol use: No    Alcohol/week: 0.0 standard drinks  . Drug use: No  . Sexual activity: Not Currently  Other Topics Concern  . Not on file  Social History Narrative  . Not on file   Social Determinants of Health   Financial Resource Strain: Not on file  Food Insecurity: Not on file  Transportation Needs: Not on file  Physical Activity: Not on file  Stress: Not on file  Social Connections: Not on file  Intimate Partner Violence: Not on file     Review of Systems    General:  No chills, fever, night  sweats or weight changes.  Cardiovascular:  No chest pain, dyspnea on exertion, edema, orthopnea, palpitations, paroxysmal nocturnal dyspnea. Dermatological: No rash, lesions/masses Respiratory: No cough, dyspnea Urologic: No hematuria, dysuria Abdominal:   No nausea, vomiting, diarrhea, bright red blood per rectum, melena, or hematemesis Neurologic:  No visual changes, wkns, changes in mental status. All other systems reviewed and are otherwise negative except  as noted above.  Physical Exam    BP 140/85 (BP Location: Right Arm)   Pulse 86   Temp 98.3 F (36.8 C) (Oral)   Resp 16   Ht 6\' 1"  (1.854 m)   Wt 97.5 kg   SpO2 97%   BMI 28.37 kg/m  General: Alert, NAD HEENT: Normal  Neck: No bruits or JVD. Lungs:  Resp regular and unlabored, CTA bilaterally. Heart: Regular rhythm, no s3, s4, or murmurs. Abdomen: Soft, non-tender, non-distended, BS +.  Extremities: Warm. No clubbing, cyanosis or edema. DP/PT/Radials 2+ and equal bilaterally. Psych: Normal affect. Neuro: Alert and oriented. No gross focal deficits. No abnormal movements.  Labs    Troponin [facility] Test) Troponin 20, 23   Lab Results  Component Value Date   WBC 15.2 (H) 01/03/2021   HGB 16.9 01/03/2021   HCT 49.1 01/03/2021   MCV 88.6 01/03/2021   PLT 262 01/03/2021    Recent Labs  Lab 01/03/21 1533  NA 134*  K 4.0  CL 100  CO2 23  BUN 14  CREATININE 0.98  CALCIUM 9.1  GLUCOSE 111*   Lab Results  Component Value Date   CHOL 187 03/18/2020   HDL 35 (L) 03/18/2020   LDLCALC 118 (H) 03/18/2020   TRIG 195 (H) 03/18/2020   Lab Results  Component Value Date   DDIMER 0.68 (H) 08/30/2017     Radiology Studies    DG Chest 2 View  Result Date: 01/03/2021 CLINICAL DATA:  Chest pain and shortness of breath, onset this morning. EXAM: CHEST - 2 VIEW COMPARISON:  Most recent radiograph 08/29/2017.  Chest CT 09/10/2018 FINDINGS: Right-sided pacemaker in place. Median sternotomy. Stable  hyperinflation and interstitial coarsening consistent with COPD. Normal heart size with stable mediastinal contours. Coronary artery calcifications/stents. No focal airspace disease. No pneumothorax or pleural effusion. No pulmonary edema. No acute osseous abnormalities are seen. IMPRESSION: 1. No acute chest findings. 2. COPD. Electronically Signed   By: Keith Rake M.D.   On: 01/03/2021 16:05   CT ANGIO CHEST AORTA W/CM & OR WO/CM  Result Date: 01/03/2021 CLINICAL DATA:  Chest pain EXAM: CT ANGIOGRAPHY CHEST WITH CONTRAST TECHNIQUE: Multidetector CT imaging of the chest was performed using the standard protocol during bolus administration of intravenous contrast. Multiplanar CT image reconstructions and MIPs were obtained to evaluate the vascular anatomy. CONTRAST:  14mL OMNIPAQUE IOHEXOL 350 MG/ML SOLN COMPARISON:  09/10/2018 CT, chest x-ray from earlier in the same day. FINDINGS: Cardiovascular: Mild dilatation of the ascending aorta to 4.1 cm is noted. Sinus of Valsalva measures approximately 4.3 cm. Sino-tubular junction measures approximately 3.1 cm. No dissection is identified. Coronary calcifications and stenting is noted. Pulmonary artery is within normal limits as visualized. Changes of prior coronary bypass grafting are seen. Defibrillator is noted. Mediastinum/Nodes: Thoracic inlet is within normal limits. Scattered small mediastinal lymph nodes are noted but not significant by size criteria. These are likely reactive in nature. The esophagus as visualized is within normal limits. Lungs/Pleura: Lungs are well aerated bilaterally. Diffuse emphysematous changes are noted. No focal infiltrate or sizable effusion is seen. No parenchymal nodules are noted. Upper Abdomen: Visualized upper abdomen is unremarkable with the exception of a right renal cyst. Musculoskeletal: Degenerative change of the thoracic spine is seen. No acute bony abnormality is noted. Review of the MIP images confirms the above  findings. IMPRESSION: Mild dilatation of the ascending aorta as described. No dissection is noted. Recommend annual imaging followup by CTA or MRA. This  recommendation follows 2010 ACCF/AHA/AATS/ACR/ASA/SCA/SCAI/SIR/STS/SVM Guidelines for the Diagnosis and Management of Patients with Thoracic Aortic Disease. Circulation. 2010; [id number]. Aortic aneurysm NOS (ICD10-I71.9) Changes of prior coronary bypass grafting. No acute abnormality in the lungs. Aortic Atherosclerosis (ICD10-I70.0) and Emphysema (ICD10-J43.9). Electronically Signed   By: Inez Catalina M.D.   On: 01/03/2021 21:50    ECG & Cardiac Imaging    Normal sinus rhythm with old anterior infarct with some nonspecific ST changes in the inferior leads, mild change from prior in July  TTE 08/25/15 Left ventricle: The cavity size was normal. Wall thickness was  increased in a pattern of mild LVH. Systolic function was  moderately to severely reduced. The estimated ejection fraction  was in the range of 30% to 35%. Abnormal GLPSS at -15%, inferior  predominence. Inferior, inferoapical, apical and distal septal  akinesis. Doppler parameters are consistent with abnormal left  ventricular relaxation (grade 1 diastolic dysfunction). The E/e&'  ratio is <8, suggesting normal LV filling pressure.  - Mitral valve: Calcified annulus. There was trivial regurgitation.  - Left atrium: The atrium was at the upper limits of normal in  size.  - Right ventricle: The cavity size was mildly dilated. Pacer wire  or catheter noted in right ventricle. Systolic function was  normal.  - Right atrium: The atrium was mildly dilated. Pacer wire or  catheter noted in right atrium.  - Inferior vena cava: The vessel was normal in size. The  respirophasic diameter changes were in the normal range (>= 50%),  consistent with normal central venous pressure.  Impressions:  - Compared to a prior echo in 02/2015, the EF has improved to   30-35%. There is akinesis of the apex, distal septum and  inferoapical walls. AICD leads are in place. There is no  pericardial effusion.   LHC and grafts 08/2017 Conclusions: 1. Stable native coronary artery disease, including 50% distal LMCA and 70% small D1 stenoses, as well as chronic total occlusion of the RCA. The distal RCA fills via left-to-right collaterals. 2. Widely patent LIMA to LAD and SVG to ramus. 3. Tangle of arteries supplying indeterminate structure in the mediastinum. A mass is a consideration. 4. Normal to low left ventricular filling pressure. 5. Moderate to severely reduced left ventricular contraction.  Recommendations: 1. Continue medical therapy and secondary prevention of coronary artery disease. 2. Recommend CT of the chest to evaluate for mediastinal mass, given tangle of abnormal arteries arising from the LIMA and patient's atypical symptoms.   Assessment & Plan   65 year old male with history of ischemic cardiomyopathy s/p CABG, LIMA --> LAD, SVG --> Ramus with occluded mRCA s/p CTO intervention and subsequently re-occluded, heart failure with reduced ejection fraction s/p dual-chamber ICD, recently diagnosed paroxysmal atrial fibrillation who is admitted for unstable angina.  Problem list Unstable angina Chronic CAD s/p CABG, mulitple prior PCI Chronic systolic and diastolic heart failure due to ischemic CAD History of Gout Hypertension  Unstable angina - ASA 325 and 81 mg daily - Atorvastatin 80 mg daily - Heparin infusion - Plan angiogram in morning, NPO @ MN - TTE - Nitroglycerin PRN; conitnue home imdur.  Sildenafil is on MAR cautioned against use with nitrates  #Chronic CAD s/p CABG, mulitple prior PCI - As above, continue aspirin, statin, nitrates PRN, home BB  Chronic systolic and diastolic heart failure due to ischemic CAD - Continue carvedilol 6.25 mg BID; titrate as tolearted - Continue lisionpril 5 mg daily increase if  tolerates beta blocekr increase -  Continue spironolactone 12.5 mg daily - Repeat TEE; no interval EF assessment since 2018 LV gram though symptoms stable  #HTN - As above  #gout - Continue home allopurinol  History of Gout Hypertension  Nutrition: NPO MN DVT ppx: Anticoagulated Advanced Care Planning: Full code   Signed, Delight Hoh, MD 01/04/2021, 12:18 AM

## 2021-01-04 NOTE — Progress Notes (Addendum)
Progress Note  Patient Name: Kerry Mason Date of Encounter: 01/04/2021  [facility] Cardiologist: Peter Martinique, MD   Subjective   Sitting up on the side of the bed. Still with mild chest pressure.   Inpatient Medications    Scheduled Meds: . allopurinol  300 mg Oral Daily  . aspirin EC  81 mg Oral Daily  . atorvastatin  80 mg Oral Daily  . carvedilol  6.25 mg Oral BID WC  . furosemide  40 mg Oral Daily  . isosorbide mononitrate  30 mg Oral Daily  . lisinopril  5 mg Oral Daily  . spironolactone  25 mg Oral QHS   Continuous Infusions: . heparin 1,200 Units/hr (01/04/21 0045)   PRN Meds: acetaminophen, nitroGLYCERIN, ondansetron (ZOFRAN) IV   Vital Signs    Vitals:   01/03/21 1907 01/03/21 2045 01/04/21 0011 01/04/21 0254  BP: (!) 141/92 127/88 140/85 117/76  Pulse: 78 73 86 72  Resp: 17 17 16  (!) 22  Temp: 98.4 F (36.9 C) 98.3 F (36.8 C) 98.3 F (36.8 C) 98.2 F (36.8 C)  TempSrc: Oral Oral Oral Oral  SpO2: 94% 96% 97% 94%  Weight:      Height:       No intake or output data in the 24 hours ending 01/04/21 0718 Last 3 Weights 01/03/2021 07/16/2020 06/20/2020  Weight (lbs) 215 lb 214 lb 12.8 oz 208 lb 14.4 oz  Weight (kg) 97.523 kg 97.433 kg 94.756 kg      Telemetry    SR - Personally Reviewed  ECG    No new tracing  Physical Exam   GEN: No acute distress.   Neck: No JVD Cardiac: RRR, no murmurs, rubs, or gallops.  Respiratory: Clear to auscultation bilaterally. GI: Soft, nontender, non-distended  MS: No edema; No deformity. Neuro:  Nonfocal  Psych: Normal affect   Labs    High Sensitivity Troponin:   Recent Labs  Lab 01/03/21 1533 01/03/21 1951  TROPONINIHS 20* 23*      Chemistry Recent Labs  Lab 01/03/21 1533 01/04/21 0236  NA 134* 134*  K 4.0 4.0  CL 100 100  CO2 23 23  GLUCOSE 111* 91  BUN 14 16  CREATININE 0.98 0.91  CALCIUM 9.1 9.0  GFRNONAA >60 >60  ANIONGAP 11 11     Hematology Recent Labs  Lab  01/03/21 1533 01/04/21 0236  WBC 15.2* 16.3*  RBC 5.54 5.46  HGB 16.9 16.5  HCT 49.1 48.7  MCV 88.6 89.2  MCH 30.5 30.2  MCHC 34.4 33.9  RDW 14.5 14.7  PLT 262 275    BNPNo results for input(s): BNP, PROBNP in the last 168 hours.   DDimer No results for input(s): DDIMER in the last 168 hours.   Radiology    DG Chest 2 View  Result Date: 01/03/2021 CLINICAL DATA:  Chest pain and shortness of breath, onset this morning. EXAM: CHEST - 2 VIEW COMPARISON:  Most recent radiograph 08/29/2017.  Chest CT 09/10/2018 FINDINGS: Right-sided pacemaker in place. Median sternotomy. Stable hyperinflation and interstitial coarsening consistent with COPD. Normal heart size with stable mediastinal contours. Coronary artery calcifications/stents. No focal airspace disease. No pneumothorax or pleural effusion. No pulmonary edema. No acute osseous abnormalities are seen. IMPRESSION: 1. No acute chest findings. 2. COPD. Electronically Signed   By: Keith Rake M.D.   On: 01/03/2021 16:05   CT ANGIO CHEST AORTA W/CM & OR WO/CM  Result Date: 01/03/2021 CLINICAL DATA:  Chest pain EXAM: CT ANGIOGRAPHY  CHEST WITH CONTRAST TECHNIQUE: Multidetector CT imaging of the chest was performed using the standard protocol during bolus administration of intravenous contrast. Multiplanar CT image reconstructions and MIPs were obtained to evaluate the vascular anatomy. CONTRAST:  36mL OMNIPAQUE IOHEXOL 350 MG/ML SOLN COMPARISON:  09/10/2018 CT, chest x-ray from earlier in the same day. FINDINGS: Cardiovascular: Mild dilatation of the ascending aorta to 4.1 cm is noted. Sinus of Valsalva measures approximately 4.3 cm. Sino-tubular junction measures approximately 3.1 cm. No dissection is identified. Coronary calcifications and stenting is noted. Pulmonary artery is within normal limits as visualized. Changes of prior coronary bypass grafting are seen. Defibrillator is noted. Mediastinum/Nodes: Thoracic inlet is within normal  limits. Scattered small mediastinal lymph nodes are noted but not significant by size criteria. These are likely reactive in nature. The esophagus as visualized is within normal limits. Lungs/Pleura: Lungs are well aerated bilaterally. Diffuse emphysematous changes are noted. No focal infiltrate or sizable effusion is seen. No parenchymal nodules are noted. Upper Abdomen: Visualized upper abdomen is unremarkable with the exception of a right renal cyst. Musculoskeletal: Degenerative change of the thoracic spine is seen. No acute bony abnormality is noted. Review of the MIP images confirms the above findings. IMPRESSION: Mild dilatation of the ascending aorta as described. No dissection is noted. Recommend annual imaging followup by CTA or MRA. This recommendation follows 2010 ACCF/AHA/AATS/ACR/ASA/SCA/SCAI/SIR/STS/SVM Guidelines for the Diagnosis and Management of Patients with Thoracic Aortic Disease. Circulation. 2010; 121: Q759-F638. Aortic aneurysm NOS (ICD10-I71.9) Changes of prior coronary bypass grafting. No acute abnormality in the lungs. Aortic Atherosclerosis (ICD10-I70.0) and Emphysema (ICD10-J43.9). Electronically Signed   By: Inez Catalina M.D.   On: 01/03/2021 21:50    Cardiac Studies   Cath: 08/2017  Conclusion  Conclusions: 1. Stable native coronary artery disease, including 50% distal LMCA and 70% small D1 stenoses, as well as chronic total occlusion of the RCA. The distal RCA fills via left-to-right collaterals. 2. Widely patent LIMA to LAD and SVG to ramus. 3. Tangle of arteries supplying indeterminate structure in the mediastinum. A mass is a consideration. 4. Normal to low left ventricular filling pressure. 5. Moderate to severely reduced left ventricular contraction.  Recommendations: 1. Continue medical therapy and secondary prevention of coronary artery disease. 2. Recommend CT of the chest to evaluate for mediastinal mass, given tangle of abnormal arteries arising from the  LIMA and patient's atypical symptoms.  Nelva Bush, MD [facility] Pager: [phone]  Diagnostic Dominance: Right    Echo: 08/2015  Study Conclusions   - Left ventricle: Systolic function was moderately to severely  reduced. The estimated ejection fraction was in the range of 30%  to 35%.  - Aortic valve: No evidence of vegetation. Valve area (VTI): 3  cm^2. Valve area (Vmax): 2.91 cm^2. Valve area (Vmean): 2.8 cm^2.  - Mitral valve: There was mild regurgitation.  - Left atrium: The atrium was dilated. No evidence of thrombus in  the appendage.  - Right atrium: No evidence of thrombus in the atrial cavity or  appendage.  - Atrial septum: There was a patent foramen ovale.   Patient Profile     65 y.o. male with CAD s/p 2vCABG '16 (cath '18 with patent LIMA-LAD, SVG-ramus, known occluded RCA with left to right collaterals)   chronic systolic HF s/p ICD '15, HTN, HLD and tobacco use who presented with chest pain.   Assessment & Plan    1. Unstable angina: symptoms are somewhat atypical but does have known coronary disease. hsTn with  low flat trend. Reasonable to proceed with cath today to refine anatomy as he will likely need [place] in the setting of new afib. -- remains on IV heparin, on ASA, statin, BB, Imdur and ACE -- The patient understands that risks included but are not limited to stroke (1 in 1000), death (1 in 19), kidney failure [usually temporary] (1 in 500), bleeding (1 in 200), allergic reaction [possibly serious] (1 in 200).   2. Chronic combined systolic and diastolic HF: s/p ICD which is followed by Dr. [name]. Last echo was 2016 with EF of 30-35%. No signs of volume overload on exam.  -- On coreg and lisinopril -- check echo -- consider switching over to [facility], possibly adding SGLT2 prior to discharge  3. HTN: blood pressures stable with current therapy  4. HLD: LDL 105 -- on high dose statin  5. New Afib?: notes indicate alert  received on 1/24 for detection of Afib from device. Plan per Dr. [name] was to refer to the afib clinic. No episodes on telemetry. -- CHA2DS2-VASc Score of 3 -- Address further recommendations regarding possible [place] pending cath results  For questions or updates, please contact [facility] Please consult [email or web address] for contact info under        Signed, Reino Bellis, NP  01/04/2021, 7:18 AM    Patient seen, examined. Available data reviewed. Agree with findings, assessment, and plan as outlined by Reino Bellis, NP.  Complex patient with multiple cardiovascular issues.  Please see full consult note written earlier this morning by Dr. Blossom Hoops.  I have obtained the patient's history again to assess the appropriateness of moving forward with cardiac catheterization.  His chest pain has some typical and atypical features.  A bedside echocardiogram is performed during my evaluation and it demonstrates progressive and severe LV systolic dysfunction with evidence of prior infarction of both the LAD and RCA territories.  The patient did have a component of nitroglycerin responsive chest pain.  The pain he is currently experiencing seems more musculoskeletal as it is right-sided and positional.  He is hemodynamically stable.  His last heart catheterization from 2018 is reviewed.  I agree that it is indicated and appropriate to proceed with cardiac catheterization, bypass graft angiography, and possible PCI.  The patient is agreeable.  I have reviewed procedural risks with him.  Further plans pending his heart catheterization results.  If he continues to demonstrate bypass graft patency of the mammary artery graft and the saphenous vein graft to circumflex territories, then ongoing medical therapy would be indicated.  He will require oral anticoagulation after cardiac catheterization because atrial fibrillation has been recently diagnosed on device monitoring.  Those outpatient notes were also reviewed.   Further plans/disposition pending his cardiac catheterization results.  Sherren Mocha, M.D. 01/04/2021 10:01 AM

## 2021-01-04 NOTE — Plan of Care (Signed)

## 2021-01-04 NOTE — Telephone Encounter (Signed)
Pt is currently admitted. Will follow course to help determine follow up for afib.

## 2021-01-04 NOTE — Progress Notes (Signed)
ANTICOAGULATION CONSULT NOTE - Follow-Up Consult  Pharmacy Consult for IV Heparin Indication: chest pain/ACS  No Known Allergies  Patient Measurements: Height: 6\' 1"  (185.4 cm) Weight: 97.5 kg (215 lb) IBW/kg (Calculated) : 79.9  Heparin Dosing Weight: 97.5 kg  Vital Signs: Temp: 98.5 F (36.9 C) (02/01 0942) Temp Source: Bladder (02/01 0942) BP: 123/93 (02/01 1302) Pulse Rate: 71 (02/01 1302)  Labs: Recent Labs    01/03/21 1533 01/03/21 1951 01/04/21 0236 01/04/21 1235  HGB 16.9  --  16.5  --   HCT 49.1  --  48.7  --   PLT 262  --  275  --   LABPROT  --   --  13.5  --   INR  --   --  1.1  --   HEPARINUNFRC  --   --   --  <0.10*  CREATININE 0.98  --  0.91  --   TROPONINIHS 20* 23*  --   --     Estimated Creatinine Clearance: 100.8 mL/min (by C-G formula based on SCr of 0.91 mg/dL).   Medical History: Past Medical History:  Diagnosis Date  . Acute systolic CHF (congestive heart failure) ([place])   . AICD (automatic cardioverter/defibrillator) present   . Anxiety   . Ascending aortic aneurysm ([place]) 08/31/2017  . Back pain 08/31/2017  . Basal cell carcinoma    left arm  . CAD (coronary artery disease)    a. 2000: s/p stent of RCA 2000 with BMS  b. 2015: STEMI s/p LHC with old occlusion of RCA and DESx2 to LAD  c. 01/12/55: complicated PCI on 3/2 for CTO of mid RCA with coronary perforation and cardiac tamponade requiring emergent pericardiocentesis     . Cardiac tamponade    a. 02/03/15 2/2 coronary perforation during CTO procedure. Sealed with graftmaster coated stent.   Marland Kitchen COPD (chronic obstructive pulmonary disease) ([place]) 08/31/2017  . Gout   . History of pneumonia   . HTN (hypertension)   . Hyperlipidemia   . Ischemic cardiomyopathy    a. 2D ECHO: EF 25-30%. Akinesis of the anteroseptal and  . Left main coronary artery disease   . Myocardial infarction ([place])   . Obesity   . 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 Shortness of breath dyspnea   . Tobacco abuse   . Urinary frequency    Assessment: 65 yr old male with significant cardiac history presented to the ED with CP, unstable angina.  Pharmacy was consulted to dose IV heparin.  Pt was on no anticoagulant PTA. H/H, platelet WNL, CrCl >100 ml/min.   Pt rec'd heparin 4000 units IV bolus X 1, followed by heparin infusion at 1200 units/hr; heparin infusion was stopped for cardiac cath this afternoon. Pharmacy is consulted to restart heparin 8 hrs after sheath pull (per cath procedure log, sheath was removed at 1525 PM this afternoon). Per RN, no bleeding issues post cath.  Goal of Therapy:  Heparin level 0.3-0.7 units/ml Monitor platelets by anticoagulation protocol: Yes   Plan:  Restart heparin infusion at 1200 units/hr at 23:30 PM tonight (~8 hrs after sheath pulled) Check heparin level 6 hrs after infusion restarted Monitor daily heparin level, CBC Monitor for bleeding  Gillermina Hu, PharmD, BCPS, [facility] Clinical Pharmacist

## 2021-01-04 NOTE — Telephone Encounter (Signed)
Called and spoke to [place] ([facility]), advised Dr. [name] would like for him to follow up in AF clinic. Advised to call with further questions or concerns.

## 2021-01-04 NOTE — H&P (View-Only) (Signed)
Progress Note  Patient Name: Kerry Mason Date of Encounter: 01/04/2021  [facility] Cardiologist: Peter Martinique, MD   Subjective   Sitting up on the side of the bed. Still with mild chest pressure.   Inpatient Medications    Scheduled Meds: . allopurinol  300 mg Oral Daily  . aspirin EC  81 mg Oral Daily  . atorvastatin  80 mg Oral Daily  . carvedilol  6.25 mg Oral BID WC  . furosemide  40 mg Oral Daily  . isosorbide mononitrate  30 mg Oral Daily  . lisinopril  5 mg Oral Daily  . spironolactone  25 mg Oral QHS   Continuous Infusions: . heparin 1,200 Units/hr (01/04/21 0045)   PRN Meds: acetaminophen, nitroGLYCERIN, ondansetron (ZOFRAN) IV   Vital Signs    Vitals:   01/03/21 1907 01/03/21 2045 01/04/21 0011 01/04/21 0254  BP: (!) 141/92 127/88 140/85 117/76  Pulse: 78 73 86 72  Resp: 17 17 16  (!) 22  Temp: 98.4 F (36.9 C) 98.3 F (36.8 C) 98.3 F (36.8 C) 98.2 F (36.8 C)  TempSrc: Oral Oral Oral Oral  SpO2: 94% 96% 97% 94%  Weight:      Height:       No intake or output data in the 24 hours ending 01/04/21 0718 Last 3 Weights 01/03/2021 07/16/2020 06/20/2020  Weight (lbs) 215 lb 214 lb 12.8 oz 208 lb 14.4 oz  Weight (kg) 97.523 kg 97.433 kg 94.756 kg      Telemetry    SR - Personally Reviewed  ECG    No new tracing  Physical Exam   GEN: No acute distress.   Neck: No JVD Cardiac: RRR, no murmurs, rubs, or gallops.  Respiratory: Clear to auscultation bilaterally. GI: Soft, nontender, non-distended  MS: No edema; No deformity. Neuro:  Nonfocal  Psych: Normal affect   Labs    High Sensitivity Troponin:   Recent Labs  Lab 01/03/21 1533 01/03/21 1951  TROPONINIHS 20* 23*      Chemistry Recent Labs  Lab 01/03/21 1533 01/04/21 0236  NA 134* 134*  K 4.0 4.0  CL 100 100  CO2 23 23  GLUCOSE 111* 91  BUN 14 16  CREATININE 0.98 0.91  CALCIUM 9.1 9.0  GFRNONAA >60 >60  ANIONGAP 11 11     Hematology Recent Labs  Lab  01/03/21 1533 01/04/21 0236  WBC 15.2* 16.3*  RBC 5.54 5.46  HGB 16.9 16.5  HCT 49.1 48.7  MCV 88.6 89.2  MCH 30.5 30.2  MCHC 34.4 33.9  RDW 14.5 14.7  PLT 262 275    BNPNo results for input(s): BNP, PROBNP in the last 168 hours.   DDimer No results for input(s): DDIMER in the last 168 hours.   Radiology    DG Chest 2 View  Result Date: 01/03/2021 CLINICAL DATA:  Chest pain and shortness of breath, onset this morning. EXAM: CHEST - 2 VIEW COMPARISON:  Most recent radiograph 08/29/2017.  Chest CT 09/10/2018 FINDINGS: Right-sided pacemaker in place. Median sternotomy. Stable hyperinflation and interstitial coarsening consistent with COPD. Normal heart size with stable mediastinal contours. Coronary artery calcifications/stents. No focal airspace disease. No pneumothorax or pleural effusion. No pulmonary edema. No acute osseous abnormalities are seen. IMPRESSION: 1. No acute chest findings. 2. COPD. Electronically Signed   By: Keith Rake M.D.   On: 01/03/2021 16:05   CT ANGIO CHEST AORTA W/CM & OR WO/CM  Result Date: 01/03/2021 CLINICAL DATA:  Chest pain EXAM: CT ANGIOGRAPHY  CHEST WITH CONTRAST TECHNIQUE: Multidetector CT imaging of the chest was performed using the standard protocol during bolus administration of intravenous contrast. Multiplanar CT image reconstructions and MIPs were obtained to evaluate the vascular anatomy. CONTRAST:  36mL OMNIPAQUE IOHEXOL 350 MG/ML SOLN COMPARISON:  09/10/2018 CT, chest x-ray from earlier in the same day. FINDINGS: Cardiovascular: Mild dilatation of the ascending aorta to 4.1 cm is noted. Sinus of Valsalva measures approximately 4.3 cm. Sino-tubular junction measures approximately 3.1 cm. No dissection is identified. Coronary calcifications and stenting is noted. Pulmonary artery is within normal limits as visualized. Changes of prior coronary bypass grafting are seen. Defibrillator is noted. Mediastinum/Nodes: Thoracic inlet is within normal  limits. Scattered small mediastinal lymph nodes are noted but not significant by size criteria. These are likely reactive in nature. The esophagus as visualized is within normal limits. Lungs/Pleura: Lungs are well aerated bilaterally. Diffuse emphysematous changes are noted. No focal infiltrate or sizable effusion is seen. No parenchymal nodules are noted. Upper Abdomen: Visualized upper abdomen is unremarkable with the exception of a right renal cyst. Musculoskeletal: Degenerative change of the thoracic spine is seen. No acute bony abnormality is noted. Review of the MIP images confirms the above findings. IMPRESSION: Mild dilatation of the ascending aorta as described. No dissection is noted. Recommend annual imaging followup by CTA or MRA. This recommendation follows 2010 ACCF/AHA/AATS/ACR/ASA/SCA/SCAI/SIR/STS/SVM Guidelines for the Diagnosis and Management of Patients with Thoracic Aortic Disease. Circulation. 2010; 121: Q759-F638. Aortic aneurysm NOS (ICD10-I71.9) Changes of prior coronary bypass grafting. No acute abnormality in the lungs. Aortic Atherosclerosis (ICD10-I70.0) and Emphysema (ICD10-J43.9). Electronically Signed   By: Inez Catalina M.D.   On: 01/03/2021 21:50    Cardiac Studies   Cath: 08/2017  Conclusion  Conclusions: 1. Stable native coronary artery disease, including 50% distal LMCA and 70% small D1 stenoses, as well as chronic total occlusion of the RCA. The distal RCA fills via left-to-right collaterals. 2. Widely patent LIMA to LAD and SVG to ramus. 3. Tangle of arteries supplying indeterminate structure in the mediastinum. A mass is a consideration. 4. Normal to low left ventricular filling pressure. 5. Moderate to severely reduced left ventricular contraction.  Recommendations: 1. Continue medical therapy and secondary prevention of coronary artery disease. 2. Recommend CT of the chest to evaluate for mediastinal mass, given tangle of abnormal arteries arising from the  LIMA and patient's atypical symptoms.  Nelva Bush, MD [facility] Pager: [phone]  Diagnostic Dominance: Right    Echo: 08/2015  Study Conclusions   - Left ventricle: Systolic function was moderately to severely  reduced. The estimated ejection fraction was in the range of 30%  to 35%.  - Aortic valve: No evidence of vegetation. Valve area (VTI): 3  cm^2. Valve area (Vmax): 2.91 cm^2. Valve area (Vmean): 2.8 cm^2.  - Mitral valve: There was mild regurgitation.  - Left atrium: The atrium was dilated. No evidence of thrombus in  the appendage.  - Right atrium: No evidence of thrombus in the atrial cavity or  appendage.  - Atrial septum: There was a patent foramen ovale.   Patient Profile     65 y.o. male with CAD s/p 2vCABG '16 (cath '18 with patent LIMA-LAD, SVG-ramus, known occluded RCA with left to right collaterals)   chronic systolic HF s/p ICD '15, HTN, HLD and tobacco use who presented with chest pain.   Assessment & Plan    1. Unstable angina: symptoms are somewhat atypical but does have known coronary disease. hsTn with  low flat trend. Reasonable to proceed with cath today to refine anatomy as he will likely need [place] in the setting of new afib. -- remains on IV heparin, on ASA, statin, BB, Imdur and ACE -- The patient understands that risks included but are not limited to stroke (1 in 1000), death (1 in 19), kidney failure [usually temporary] (1 in 500), bleeding (1 in 200), allergic reaction [possibly serious] (1 in 200).   2. Chronic combined systolic and diastolic HF: s/p ICD which is followed by Dr. [name]. Last echo was 2016 with EF of 30-35%. No signs of volume overload on exam.  -- On coreg and lisinopril -- check echo -- consider switching over to [facility], possibly adding SGLT2 prior to discharge  3. HTN: blood pressures stable with current therapy  4. HLD: LDL 105 -- on high dose statin  5. New Afib?: notes indicate alert  received on 1/24 for detection of Afib from device. Plan per Dr. [name] was to refer to the afib clinic. No episodes on telemetry. -- CHA2DS2-VASc Score of 3 -- Address further recommendations regarding possible [place] pending cath results  For questions or updates, please contact [facility] Please consult [email or web address] for contact info under        Signed, Reino Bellis, NP  01/04/2021, 7:18 AM    Patient seen, examined. Available data reviewed. Agree with findings, assessment, and plan as outlined by Reino Bellis, NP.  Complex patient with multiple cardiovascular issues.  Please see full consult note written earlier this morning by Dr. Blossom Hoops.  I have obtained the patient's history again to assess the appropriateness of moving forward with cardiac catheterization.  His chest pain has some typical and atypical features.  A bedside echocardiogram is performed during my evaluation and it demonstrates progressive and severe LV systolic dysfunction with evidence of prior infarction of both the LAD and RCA territories.  The patient did have a component of nitroglycerin responsive chest pain.  The pain he is currently experiencing seems more musculoskeletal as it is right-sided and positional.  He is hemodynamically stable.  His last heart catheterization from 2018 is reviewed.  I agree that it is indicated and appropriate to proceed with cardiac catheterization, bypass graft angiography, and possible PCI.  The patient is agreeable.  I have reviewed procedural risks with him.  Further plans pending his heart catheterization results.  If he continues to demonstrate bypass graft patency of the mammary artery graft and the saphenous vein graft to circumflex territories, then ongoing medical therapy would be indicated.  He will require oral anticoagulation after cardiac catheterization because atrial fibrillation has been recently diagnosed on device monitoring.  Those outpatient notes were also reviewed.   Further plans/disposition pending his cardiac catheterization results.  Sherren Mocha, M.D. 01/04/2021 10:01 AM

## 2021-01-04 NOTE — Interval H&P Note (Signed)
Cath Lab Visit (complete for each Cath Lab visit)  Clinical Evaluation Leading to the Procedure:   ACS: Yes.    Non-ACS:    Anginal Classification: CCS IV  Anti-ischemic medical therapy: Minimal Therapy (1 class of medications)  Non-Invasive Test Results: No non-invasive testing performed  Prior CABG: Previous CABG      History and Physical Interval Note:  01/04/2021 1:48 PM  Fuad Mroczka  has presented today for surgery, with the diagnosis of unstable angina.  The various methods of treatment have been discussed with the patient and family. After consideration of risks, benefits and other options for treatment, the patient has consented to  Procedure(s): LEFT HEART CATH AND CORONARY ANGIOGRAPHY (N/A) as a surgical intervention.  The patient's history has been reviewed, patient examined, no change in status, stable for surgery.  I have reviewed the patient's chart and labs.  Questions were answered to the patient's satisfaction.     Larae Grooms

## 2021-01-05 ENCOUNTER — Encounter (HOSPITAL_COMMUNITY): Payer: Self-pay | Admitting: Interventional Cardiology

## 2021-01-05 LAB — CBC
HCT: 48.9 % (ref 39.0–52.0)
Hemoglobin: 15.8 g/dL (ref 13.0–17.0)
MCH: 29.2 pg (ref 26.0–34.0)
MCHC: 32.3 g/dL (ref 30.0–36.0)
MCV: 90.4 fL (ref 80.0–100.0)
Platelets: 232 10*3/uL (ref 150–400)
RBC: 5.41 MIL/uL (ref 4.22–5.81)
RDW: 14.5 % (ref 11.5–15.5)
WBC: 12.6 10*3/uL — ABNORMAL HIGH (ref 4.0–10.5)
nRBC: 0 % (ref 0.0–0.2)

## 2021-01-05 LAB — BASIC METABOLIC PANEL
Anion gap: 11 (ref 5–15)
BUN: 17 mg/dL (ref 8–23)
CO2: 19 mmol/L — ABNORMAL LOW (ref 22–32)
Calcium: 8.6 mg/dL — ABNORMAL LOW (ref 8.9–10.3)
Chloride: 105 mmol/L (ref 98–111)
Creatinine, Ser: 0.83 mg/dL (ref 0.61–1.24)
GFR, Estimated: >60 mL/min (ref >60)
Glucose, Bld: 99 mg/dL (ref 70–99)
Potassium: 4 mmol/L (ref 3.5–5.1)
Sodium: 135 mmol/L (ref 135–145)

## 2021-01-05 LAB — HEPARIN LEVEL (UNFRACTIONATED): Heparin Unfractionated: <0.1 IU/mL — ABNORMAL LOW (ref 0.30–0.70)

## 2021-01-05 MED ORDER — NITROGLYCERIN 0.4 MG SL SUBL: SUBLINGUAL_TABLET | SUBLINGUAL | 2 refills | Status: DC

## 2021-01-05 MED ORDER — FUROSEMIDE 40 MG PO TABS: 40.0000 mg | ORAL_TABLET | Freq: Every day | ORAL | 1 refills | Status: DC

## 2021-01-05 MED ORDER — APIXABAN 5 MG PO TABS: 5.0000 mg | ORAL_TABLET | Freq: Two times a day (BID) | ORAL | 0 refills | Status: DC

## 2021-01-05 MED ORDER — LOSARTAN POTASSIUM 25 MG PO TABS: 25.0000 mg | ORAL_TABLET | Freq: Every day | ORAL | Status: DC

## 2021-01-05 MED ORDER — APIXABAN 5 MG PO TABS
5.0000 mg | ORAL_TABLET | Freq: Two times a day (BID) | ORAL | Status: DC
  Administered 2021-01-05: 5 mg via ORAL
  Filled 2021-01-05: qty 1

## 2021-01-05 MED ORDER — ATORVASTATIN CALCIUM 80 MG PO TABS: 80.0000 mg | ORAL_TABLET | Freq: Every day | ORAL | 1 refills | Status: DC

## 2021-01-05 MED ORDER — EZETIMIBE 10 MG PO TABS: 10.0000 mg | ORAL_TABLET | Freq: Every day | ORAL | 0 refills | Status: DC

## 2021-01-05 MED ORDER — CARVEDILOL 6.25 MG PO TABS: 6.2500 mg | ORAL_TABLET | Freq: Two times a day (BID) | ORAL | 3 refills | Status: DC

## 2021-01-05 MED ORDER — LISINOPRIL 5 MG PO TABS: 5.0000 mg | ORAL_TABLET | Freq: Every day | ORAL | Status: DC

## 2021-01-05 MED ORDER — EZETIMIBE 10 MG PO TABS
10.0000 mg | ORAL_TABLET | Freq: Every day | ORAL | Status: DC
  Administered 2021-01-05: 10 mg via ORAL
  Filled 2021-01-05: qty 1

## 2021-01-05 MED ORDER — ISOSORBIDE MONONITRATE ER 30 MG PO TB24: 30.0000 mg | ORAL_TABLET | Freq: Every day | ORAL | 1 refills | Status: DC

## 2021-01-05 NOTE — Discharge Summary (Addendum)
Discharge Summary    Patient ID: Romero Giunta MRN: [id number]; DOB: August 29, 1956  Admit date: 01/03/2021 Discharge date: 01/05/2021  Primary Care Provider: Pcp, No  Primary Cardiologist: Peter Martinique, MD  Primary Electrophysiologist:  None   Discharge Diagnoses    Principal Problem:   Unstable angina [facility]) Active Problems:   Tobacco abuse   HTN (hypertension)   Hyperlipidemia   PAF    Diagnostic Studies/Procedures    Cath: 01/04/21   LM lesion is 50% stenosed. Patent LIMA to LAD. Patent SVG to ramus.  2nd Diag lesion is 70% stenosed.  Mid RCA to Dist RCA lesion is 100% stenosed.  Prox RCA to Mid RCA lesion is 100% stenosed.  Ost RCA to Prox RCA lesion is 95% stenosed.  Ost Cx lesion is 80% stenosed. This territory is not bypassed, but it is a relatively small vessel. Unable to advance balloon after successfully guiding the wire though the ramus graft and retrograde to the ostial circumflex and across the lesion.  The left ventricular ejection fraction is 25-35% by visual estimate.  There is moderate to severe left ventricular systolic dysfunction.  LV end diastolic pressure is normal.  There is no aortic valve stenosis.   Medical therapy for CAD.  Unsuccessful attempt at PCI of ostial circumflex.  Restart IV heparin 8 hours post sheath pull.  OK to start [place] tomorrow for atrial fibrillation.  He was loaded with Plavix but will not continue if he is starting a DOAC.  If he is not starting a DOAC, DAPT would be reasonable for his CAD.    Diagnostic Dominance: Right    Echo: 01/04/21  IMPRESSIONS    1. There is no left ventricular thrombus (Definity contrast was used).  There is apical dyskinesis. There is severe inferior wall hypokinesis and  inferoseptal akinesis.. Left ventricular ejection fraction, by estimation,  is 25 to 30%. The left ventricle  has severely decreased function. The left ventricle demonstrates regional  wall motion abnormalities (see  scoring diagram/findings for description).  The left ventricular internal cavity size was moderately to severely  dilated. Left ventricular diastolic  parameters are consistent with Grade I diastolic dysfunction (impaired  relaxation).  2. Right ventricular systolic function is normal. The right ventricular  size is normal. There is normal pulmonary artery systolic pressure. The  estimated right ventricular systolic pressure is 123XX123 mmHg.  3. Left atrial size was severely dilated.  4. The mitral valve is normal in structure. Trivial mitral valve  regurgitation.  5. The aortic valve is tricuspid. There is mild calcification of the  aortic valve. There is mild thickening of the aortic valve. Aortic valve  regurgitation is not visualized. Mild aortic valve sclerosis is present,  with no evidence of aortic valve  stenosis.  6. Aortic dilatation noted. There is mild dilatation of the aortic root,  measuring 39 mm.  7. The inferior vena cava is normal in size with greater than 50%  respiratory variability, suggesting right atrial pressure of 3 mmHg.   Comparison(s): Prior images unable to be directly viewed, comparison made  by report only. The left ventricular function is worsened. Multiple  previous reports from 2016 describe wall motion abnormalities in either  the right coronary artery territory or  the LAD artery territory (although not at the same time). The apex was  previously described as akinetic. On the current study there is evidence  of prior infarction in both RCA and LAD territory with a frankly  dyskinetic apex.  _____________  History of Present Illness     Kerry Mason is a 65 y.o. male with PMH of CAD s/p 2vCABG '16 (cath '18 with patent LIMA-LAD, SVG-ramus, known occluded RCA with left to right collaterals)  chronic systolic HF s/p ICD '15, HTN, HLD and tobacco use who presented with chest pain.   He reported that he rarely has any episodes of chest pain or  requires nitroglycerin. The day of admission he was awakened by a squeezing bilateral pressure like pain and took two nitroglycerin.  The pain resolved and he went to work at [employer] where he did well until mid morning nad subsequently developed chest pain/pressure again so he decided to present to the emergency department.  He was chest pain free at the time of assessment after a third dose of nitroglycerin.  No fevers, chills, nausea, vomiting, shortness of breath, orthopnea, PND, or palpitations.  He hasn't noticed any worsening swelling.  He felt like this was definitely related to his heart and could be due to recurrent ischemia.  He was admitted with plans for cardiac cath.  Of note, he was recently informed by Dr. Tanna Furry office that his DC-ICD idenitifed AF with subsqeuent referral.  He's not been started on anticoagulant.  Hospital Course     1. Unstable angina: symptoms were somewhat atypical but with known coronary disease he was taken for cath. hsTn with low flat trend. Cath noted above with attempted but unsuccessful PCI of the osital LCx, patent LIMA-LAD and SVG- Ramus. Known occluded RCA. Plan to continue medical therapy. Was loaded with plavix in the cath lab, but will not continue as he did not have intervention and needs [place]. Continue ASA  2. Chronic combined systolic and diastolic HF: s/p ICD which is followed by Dr. [name]. Last echo was 2016 with EF of 30-35%. No signs of volume overload on exam during admission. Echo this admission showed an EF of 25-30% with severe inferior wall hypokinesis and inferoseptal hypokinesis.  -- On coreg and lisinopril prior to admission. Would plan to transition to [facility] as an outpatient at follow up as he received lisinopril prior to discharge. Does require a PA that was submitted prior to discharge  3. HTN: blood pressures stable with current therapy  4. HLD: LDL 105 -- on high dose statin, added Zetia prior to discharge  5. New  PAF: notes indicate alert received on 1/24 for detection of Afib from device. Plan per Dr. [name] was to refer to the afib clinic. No episodes on telemetry. -- CHA2DS2-VASc Score of 3. Will start on Eliquis 5mg  BID on discharge. Has follow up in the office arranged, if needed can readdress need for Afib clinic as an outpatient  General: Well developed, well nourished, male appearing in no acute distress. Head: Normocephalic, atraumatic.  Neck: Supple without bruits, JVD. Lungs:  Resp regular and unlabored, CTA. Heart: RRR, S1, S2, no S3, S4, or murmur; no rub. Abdomen: Soft, non-tender, non-distended with normoactive bowel sounds. No hepatomegaly. No rebound/guarding. No obvious abdominal masses. Extremities: No clubbing, cyanosis, edema. Distal pedal pulses are 2+ bilaterally. Left radial cath site stable without bruising or hematoma Neuro: Alert and oriented X 3. Moves all extremities spontaneously. Psych: Normal affect.   Did the patient have an acute coronary syndrome (MI, NSTEMI, STEMI, etc) this admission?:  No                               Did  the patient have a percutaneous coronary intervention (stent / angioplasty)?:  No.       _____________  Discharge Vitals Blood pressure 125/79, pulse 74, temperature 98.4 F (36.9 C), temperature source Oral, resp. rate 17, height 6\' 1"  (1.854 m), weight 97.5 kg, SpO2 96 %.  Filed Weights   01/03/21 1527  Weight: 97.5 kg    Labs & Radiologic Studies    CBC Recent Labs    01/04/21 0236 01/05/21 0259  WBC 16.3* 12.6*  HGB 16.5 15.8  HCT 48.7 48.9  MCV 89.2 90.4  PLT 275 [id number]   Basic Metabolic Panel Recent Labs    01/04/21 0236 01/05/21 0259  NA 134* 135  K 4.0 4.0  CL 100 105  CO2 23 19*  GLUCOSE 91 99  BUN 16 17  CREATININE 0.91 0.83  CALCIUM 9.0 8.6*   Liver Function Tests No results for input(s): AST, ALT, ALKPHOS, BILITOT, PROT, ALBUMIN in the last 72 hours. No results for input(s): LIPASE, AMYLASE in the last 72  hours. High Sensitivity Troponin:   Recent Labs  Lab 01/03/21 1533 01/03/21 1951  TROPONINIHS 20* 23*    BNP Invalid input(s): POCBNP D-Dimer No results for input(s): DDIMER in the last 72 hours. Hemoglobin A1C No results for input(s): HGBA1C in the last 72 hours. Fasting Lipid Panel Recent Labs    01/04/21 0236  CHOL 181  HDL 42  LDLCALC 105*  TRIG 169*  CHOLHDL 4.3   Thyroid Function Tests No results for input(s): TSH, T4TOTAL, T3FREE, THYROIDAB in the last 72 hours.  Invalid input(s): FREET3 _____________  DG Chest 2 View  Result Date: 01/03/2021 CLINICAL DATA:  Chest pain and shortness of breath, onset this morning. EXAM: CHEST - 2 VIEW COMPARISON:  Most recent radiograph 08/29/2017.  Chest CT 09/10/2018 FINDINGS: Right-sided pacemaker in place. Median sternotomy. Stable hyperinflation and interstitial coarsening consistent with COPD. Normal heart size with stable mediastinal contours. Coronary artery calcifications/stents. No focal airspace disease. No pneumothorax or pleural effusion. No pulmonary edema. No acute osseous abnormalities are seen. IMPRESSION: 1. No acute chest findings. 2. COPD. Electronically Signed   By: Keith Rake M.D.   On: 01/03/2021 16:05   CARDIAC CATHETERIZATION  Result Date: 01/04/2021  LM lesion is 50% stenosed. Patent LIMA to LAD. Patent SVG to ramus.  2nd Diag lesion is 70% stenosed.  Mid RCA to Dist RCA lesion is 100% stenosed.  Prox RCA to Mid RCA lesion is 100% stenosed.  Ost RCA to Prox RCA lesion is 95% stenosed.  Ost Cx lesion is 80% stenosed. This territory is not bypassed, but it is a relatively small vessel. Unable to advance balloon after successfully guiding the wire though the ramus graft and retrograde to the ostial circumflex and across the lesion.  The left ventricular ejection fraction is 25-35% by visual estimate.  There is moderate to severe left ventricular systolic dysfunction.  LV end diastolic pressure is normal.   There is no aortic valve stenosis.  Medical therapy for CAD.  Unsuccessful attempt at PCI of ostial circumflex.  Restart IV heparin 8 hours post sheath pull.  OK to start [place] tomorrow for atrial fibrillation.  He was loaded with Plavix but will not continue if he is starting a DOAC.  If he is not starting a DOAC, DAPT would be reasonable for his CAD.    CT ANGIO CHEST AORTA W/CM & OR WO/CM  Result Date: 01/03/2021 CLINICAL DATA:  Chest pain EXAM: CT ANGIOGRAPHY CHEST WITH CONTRAST TECHNIQUE:  Multidetector CT imaging of the chest was performed using the standard protocol during bolus administration of intravenous contrast. Multiplanar CT image reconstructions and MIPs were obtained to evaluate the vascular anatomy. CONTRAST:  48mL OMNIPAQUE IOHEXOL 350 MG/ML SOLN COMPARISON:  09/10/2018 CT, chest x-ray from earlier in the same day. FINDINGS: Cardiovascular: Mild dilatation of the ascending aorta to 4.1 cm is noted. Sinus of Valsalva measures approximately 4.3 cm. Sino-tubular junction measures approximately 3.1 cm. No dissection is identified. Coronary calcifications and stenting is noted. Pulmonary artery is within normal limits as visualized. Changes of prior coronary bypass grafting are seen. Defibrillator is noted. Mediastinum/Nodes: Thoracic inlet is within normal limits. Scattered small mediastinal lymph nodes are noted but not significant by size criteria. These are likely reactive in nature. The esophagus as visualized is within normal limits. Lungs/Pleura: Lungs are well aerated bilaterally. Diffuse emphysematous changes are noted. No focal infiltrate or sizable effusion is seen. No parenchymal nodules are noted. Upper Abdomen: Visualized upper abdomen is unremarkable with the exception of a right renal cyst. Musculoskeletal: Degenerative change of the thoracic spine is seen. No acute bony abnormality is noted. Review of the MIP images confirms the above findings. IMPRESSION: Mild dilatation of the  ascending aorta as described. No dissection is noted. Recommend annual imaging followup by CTA or MRA. This recommendation follows 2010 ACCF/AHA/AATS/ACR/ASA/SCA/SCAI/SIR/STS/SVM Guidelines for the Diagnosis and Management of Patients with Thoracic Aortic Disease. Circulation. 2010; [id number]. Aortic aneurysm NOS (ICD10-I71.9) Changes of prior coronary bypass grafting. No acute abnormality in the lungs. Aortic Atherosclerosis (ICD10-I70.0) and Emphysema (ICD10-J43.9). Electronically Signed   By: Inez Catalina M.D.   On: 01/03/2021 21:50   ECHOCARDIOGRAM COMPLETE  Result Date: 01/04/2021    ECHOCARDIOGRAM REPORT   Patient Name:   MYSHON ZWAHLEN Date of Exam: 01/04/2021 Medical Rec #:  [id number]      Height:       73.0 in Accession #:    [id number]     Weight:       215.0 lb Date of Birth:  1956-10-11      BSA:          2.219 m Patient Age:    65 years       BP:           118/77 mmHg Patient Gender: M              HR:           69 bpm. Exam Location:  Inpatient Procedure: 2D Echo, Color Doppler, Cardiac Doppler and Intracardiac            Opacification Agent Indications:    Acute ischemic heart disease, unspecified I24.9  History:        Patient has prior history of Echocardiogram examinations, most                 recent 08/25/2015. CHF, CAD and Previous Myocardial Infarction,                 COPD, Signs/Symptoms:Shortness of Breath; Risk                 Factors:Hypertension, Dyslipidemia and Current Smoker. Ischemic                 cardiomyopathy.  Sonographer:    Darlina Sicilian RDCS Referring Phys: [id number] [place]  1. There is no left ventricular thrombus (Definity contrast was used). There is apical dyskinesis. There is severe inferior wall hypokinesis and inferoseptal akinesis.Marland Kitchen Left  ventricular ejection fraction, by estimation, is 25 to 30%. The left ventricle has severely decreased function. The left ventricle demonstrates regional wall motion abnormalities (see scoring diagram/findings  for description). The left ventricular internal cavity size was moderately to severely dilated. Left ventricular diastolic parameters are consistent with Grade I diastolic dysfunction (impaired relaxation).  2. Right ventricular systolic function is normal. The right ventricular size is normal. There is normal pulmonary artery systolic pressure. The estimated right ventricular systolic pressure is 06.2 mmHg.  3. Left atrial size was severely dilated.  4. The mitral valve is normal in structure. Trivial mitral valve regurgitation.  5. The aortic valve is tricuspid. There is mild calcification of the aortic valve. There is mild thickening of the aortic valve. Aortic valve regurgitation is not visualized. Mild aortic valve sclerosis is present, with no evidence of aortic valve stenosis.  6. Aortic dilatation noted. There is mild dilatation of the aortic root, measuring 39 mm.  7. The inferior vena cava is normal in size with greater than 50% respiratory variability, suggesting right atrial pressure of 3 mmHg. Comparison(s): Prior images unable to be directly viewed, comparison made by report only. The left ventricular function is worsened. Multiple previous reports from 2016 describe wall motion abnormalities in either the right coronary artery territory or the LAD artery territory (although not at the same time). The apex was previously described as akinetic. On the current study there is evidence of prior infarction in both RCA and LAD territory with a frankly dyskinetic apex. FINDINGS  Left Ventricle: There is no left ventricular thrombus (Definity contrast was used). There is apical dyskinesis. There is severe inferior wall hypokinesis and inferoseptal akinesis. Left ventricular ejection fraction, by estimation, is 25 to 30%. The left ventricle has severely decreased function. The left ventricle demonstrates regional wall motion abnormalities. Definity contrast agent was given IV to delineate the left ventricular  endocardial borders. The left ventricular internal cavity size was moderately to severely dilated. There is no left ventricular hypertrophy. Abnormal (paradoxical) septal motion consistent with post-operative status. Left ventricular diastolic parameters are consistent with Grade I diastolic dysfunction (impaired relaxation). Normal left ventricular filling pressure.  LV Wall Scoring: The mid and distal anterior septum and entire apex are dyskinetic. The mid inferoseptal segment and basal inferoseptal segment are akinetic. The anterior wall, inferior wall, and basal anteroseptal segment are hypokinetic. The antero-lateral wall and posterior wall are normal. Right Ventricle: The right ventricular size is normal. No increase in right ventricular wall thickness. Right ventricular systolic function is normal. There is normal pulmonary artery systolic pressure. The tricuspid regurgitant velocity is 2.25 m/s, and  with an assumed right atrial pressure of 3 mmHg, the estimated right ventricular systolic pressure is 37.6 mmHg. Left Atrium: Left atrial size was severely dilated. Right Atrium: Right atrial size was normal in size. Pericardium: There is no evidence of pericardial effusion. Mitral Valve: The mitral valve is normal in structure. Trivial mitral valve regurgitation. Tricuspid Valve: The tricuspid valve is normal in structure. Tricuspid valve regurgitation is mild. Aortic Valve: The aortic valve is tricuspid. There is mild calcification of the aortic valve. There is mild thickening of the aortic valve. Aortic valve regurgitation is not visualized. Mild aortic valve sclerosis is present, with no evidence of aortic valve stenosis. Pulmonic Valve: The pulmonic valve was not well visualized. Pulmonic valve regurgitation is not visualized. Aorta: Aortic dilatation noted. There is mild dilatation of the aortic root, measuring 39 mm. Venous: The inferior vena cava is normal in  size with greater than 50% respiratory  variability, suggesting right atrial pressure of 3 mmHg. IAS/Shunts: No atrial level shunt detected by color flow Doppler. Additional Comments: A pacer wire is visualized.  LEFT VENTRICLE PLAX 2D LVIDd:         6.60 cm      Diastology LVIDs:         6.30 cm      LV e' medial:    4.24 cm/s LV PW:         0.60 cm      LV E/e' medial:  7.9 LV IVS:        1.16 cm      LV e' lateral:   4.57 cm/s LVOT diam:     2.60 cm      LV E/e' lateral: 7.3 LV SV:         75 LV SV Index:   34 LVOT Area:     5.31 cm  LV Volumes (MOD) LV vol d, MOD A2C: 262.0 ml LV vol d, MOD A4C: 293.0 ml LV vol s, MOD A2C: 198.0 ml LV vol s, MOD A4C: 161.0 ml LV SV MOD A2C:     64.0 ml LV SV MOD A4C:     293.0 ml LV SV MOD BP:      102.8 ml LEFT ATRIUM             Index       RIGHT ATRIUM           Index LA diam:        6.20 cm 2.79 cm/m  RA Area:     13.90 cm LA Vol (A2C):   65.8 ml 29.66 ml/m RA Volume:   30.50 ml  13.75 ml/m LA Vol (A4C):   68.9 ml 31.05 ml/m LA Biplane Vol: 67.4 ml 30.38 ml/m  AORTIC VALVE LVOT Vmax:   80.90 cm/s LVOT Vmean:  64.200 cm/s LVOT VTI:    0.142 m  AORTA Ao Root diam: 3.90 cm MITRAL VALVE               TRICUSPID VALVE MV Area (PHT): 2.34 cm    TR Peak grad:   20.2 mmHg MV Decel Time: 324 msec    TR Vmax:        225.00 cm/s MV E velocity: 33.40 cm/s MV A velocity: 70.70 cm/s  SHUNTS MV E/A ratio:  0.47        Systemic VTI:  0.14 m                            Systemic Diam: 2.60 cm Dani Gobble Croitoru MD Electronically signed by Sanda Klein MD Signature Date/Time: 01/04/2021/9:54:30 AM    Final    Disposition   Pt is being discharged home today in good condition.  Follow-up Plans & Appointments     Follow-up Information    Deberah Pelton, NP Follow up on 01/14/2021.   Specialty: Cardiology Why: at 9:15am for your follow up appt Contact information: [address] [place] [address] [phone]              Discharge Instructions    Call MD for:  redness, tenderness, or signs of  infection (pain, swelling, redness, odor or green/yellow discharge around incision site)   Complete by: As directed    Diet - low sodium heart healthy   Complete by: As directed    Discharge instructions  Complete by: As directed    Radial Site Care Refer to this sheet in the next few weeks. These instructions provide you with information on caring for yourself after your procedure. Your caregiver may also give you more specific instructions. Your treatment has been planned according to current medical practices, but problems sometimes occur. Call your caregiver if you have any problems or questions after your procedure. HOME CARE INSTRUCTIONS You may shower the day after the procedure.Remove the bandage (dressing) and gently wash the site with plain soap and water.Gently pat the site dry.  Do not apply powder or lotion to the site.  Do not submerge the affected site in water for 3 to 5 days.  Inspect the site at least twice daily.  Do not flex or bend the affected arm for 24 hours.  No lifting over 5 pounds (2.3 kg) for 5 days after your procedure.  Do not drive home if you are discharged the same day of the procedure. Have someone else drive you.  You may drive 24 hours after the procedure unless otherwise instructed by your caregiver.  What to expect: Any bruising will usually fade within 1 to 2 weeks.  Blood that collects in the tissue (hematoma) may be painful to the touch. It should usually decrease in size and tenderness within 1 to 2 weeks.  SEEK IMMEDIATE MEDICAL CARE IF: You have unusual pain at the radial site.  You have redness, warmth, swelling, or pain at the radial site.  You have drainage (other than a small amount of blood on the dressing).  You have chills.  You have a fever or persistent symptoms for more than 72 hours.  You have a fever and your symptoms suddenly get worse.  Your arm becomes pale, cool, tingly, or numb.  You have heavy bleeding from the site. Hold  pressure on the site.   If you notice any bleeding such as blood in stool, black tarry stools, blood in urine, nosebleeds or any other unusual bleeding, call your doctor immediately. It is not normal to have this kind of bleeding while on a blood thinner and usually indicates there is an underlying problem with one of your body systems that needs to be checked out.   Increase activity slowly   Complete by: As directed       Discharge Medications   Allergies as of 01/05/2021   No Known Allergies     Medication List    TAKE these medications   acetaminophen 325 MG tablet Commonly known as: TYLENOL Take 2 tablets (650 mg total) by mouth every 4 (four) hours as needed for headache or mild pain.   AeroChamber MV inhaler Use as instructed   allopurinol 300 MG tablet Commonly known as: ZYLOPRIM Take 1 tablet (300 mg total) by mouth daily.   apixaban 5 MG Tabs tablet Commonly known as: ELIQUIS Take 1 tablet (5 mg total) by mouth 2 (two) times daily.   aspirin EC 81 MG tablet Take 81 mg by mouth daily.   atorvastatin 80 MG tablet Commonly known as: LIPITOR Take 1 tablet (80 mg total) by mouth daily.   carvedilol 6.25 MG tablet Commonly known as: COREG Take 1 tablet (6.25 mg total) by mouth 2 (two) times daily.   ezetimibe 10 MG tablet Commonly known as: ZETIA Take 1 tablet (10 mg total) by mouth daily.   furosemide 40 MG tablet Commonly known as: LASIX Take 1 tablet (40 mg total) by mouth daily.   isosorbide  mononitrate 30 MG 24 hr tablet Commonly known as: IMDUR Take 1 tablet (30 mg total) by mouth daily.   lisinopril 5 MG tablet Commonly known as: ZESTRIL Take 1 tablet (5 mg total) by mouth daily.   nitroGLYCERIN 0.4 MG SL tablet Commonly known as: NITROSTAT DISSOLVE ONE TABLET UNDER THE TONGUE EVERY 5 MINUTES AS NEEDED FOR CHEST PAIN.  DO NOT EXCEED A TOTAL OF 3 DOSES IN 15 MINUTES   predniSONE 10 MG (48) Tbpk tablet Commonly known as: STERAPRED UNI-PAK 48  TAB Take by mouth as directed.   sildenafil 50 MG tablet Commonly known as: Viagra Take 1 tablet (50 mg total) by mouth daily as needed for erectile dysfunction.   spironolactone 25 MG tablet Commonly known as: ALDACTONE Take 1 tablet (25 mg total) by mouth at bedtime. What changed: when to take this          Outstanding Labs/Studies   N/a   Duration of Discharge Encounter   Greater than 30 minutes including physician time.  Signed, Reino Bellis, NP 01/05/2021, 10:25 AM   Patient seen, examined. Available data reviewed. Agree with findings, assessment, and plan as outlined by Reino Bellis, NP.  The patient is independently interviewed and examined.  I have personally reviewed his cardiac catheterization films from yesterday's procedure.  He is alert, oriented, in no distress.  JVP is normal.  Lung fields are clear with mildly diminished breath sounds in the bases but no crackles or rhonchi.  Heart is regular rate and rhythm with no murmurs or gallops.  Abdomen is soft and nontender.  Extremities have no edema.  The left radial artery catheterization site is clear without hematoma or ecchymosis.  The patient is medically stable for discharge from the hospital today.  He will continue with medical therapy for his ischemic cardiomyopathy and coronary artery disease.  He will be started on apixaban because of recently diagnosed paroxysmal atrial fibrillation.  Zetia has been added as his LDL cholesterol remains above goal.  As outlined above, consider transition from lisinopril to [facility] at outpatient follow-up.  Sherren Mocha, M.D. 01/05/2021 10:46 AM

## 2021-01-05 NOTE — TOC Benefit Eligibility Note (Addendum)
Patient Advocate Encounter   Received notification that prior authorization for Entresto 24-26 tablets is required.   PA submitted on 01/05/2021 Key [id number] Status is pending      Received notification from [facility] on 01/06/2021 that the request for prior authorization for Entresto 24-26 teblets has been denied.       Lyndel Safe, CPhT Certified Pharmacy Technician- Transitions of [place] [facility] Stewardship Team Direct Number: [phone]  Fax: [phone]

## 2021-01-05 NOTE — Plan of Care (Signed)
Problem: Education: Goal: Knowledge of General Education information will improve Description: Including pain rating scale, medication(s)/side effects and non-pharmacologic comfort measures 01/05/2021 1024 by Camillia Herter, RN Outcome: Adequate for Discharge 01/05/2021 0733 by Camillia Herter, RN Outcome: Progressing   Problem: Health Behavior/Discharge Planning: Goal: Ability to manage health-related needs will improve 01/05/2021 1024 by Camillia Herter, RN Outcome: Adequate for Discharge 01/05/2021 0347 by Camillia Herter, RN Outcome: Progressing   Problem: Clinical Measurements: Goal: Ability to maintain clinical measurements within normal limits will improve 01/05/2021 1024 by Camillia Herter, RN Outcome: Adequate for Discharge 01/05/2021 4259 by Camillia Herter, RN Outcome: Progressing Goal: Will remain free from infection 01/05/2021 1024 by Camillia Herter, RN Outcome: Adequate for Discharge 01/05/2021 0733 by Camillia Herter, RN Outcome: Progressing Goal: Diagnostic test results will improve 01/05/2021 1024 by Camillia Herter, RN Outcome: Adequate for Discharge 01/05/2021 5638 by Camillia Herter, RN Outcome: Progressing Goal: Respiratory complications will improve 01/05/2021 1024 by Camillia Herter, RN Outcome: Adequate for Discharge 01/05/2021 7564 by Camillia Herter, RN Outcome: Progressing Goal: Cardiovascular complication will be avoided 01/05/2021 1024 by Camillia Herter, RN Outcome: Adequate for Discharge 01/05/2021 0733 by Camillia Herter, RN Outcome: Progressing   Problem: Activity: Goal: Risk for activity intolerance will decrease 01/05/2021 1024 by Camillia Herter, RN Outcome: Adequate for Discharge 01/05/2021 [phone] by Camillia Herter, RN Outcome: Progressing   Problem: Nutrition: Goal: Adequate nutrition will be maintained 01/05/2021 1024 by Camillia Herter, RN Outcome: Adequate for Discharge 01/05/2021 0733 by Camillia Herter, RN Outcome: Progressing   Problem: Coping: Goal: Level of anxiety  will decrease 01/05/2021 1024 by Camillia Herter, RN Outcome: Adequate for Discharge 01/05/2021 0733 by Camillia Herter, RN Outcome: Progressing   Problem: Elimination: Goal: Will not experience complications related to bowel motility 01/05/2021 1024 by Camillia Herter, RN Outcome: Adequate for Discharge 01/05/2021 0733 by Camillia Herter, RN Outcome: Progressing Goal: Will not experience complications related to urinary retention 01/05/2021 1024 by Camillia Herter, RN Outcome: Adequate for Discharge 01/05/2021 0733 by Camillia Herter, RN Outcome: Progressing   Problem: Pain Managment: Goal: General experience of comfort will improve 01/05/2021 1024 by Camillia Herter, RN Outcome: Adequate for Discharge 01/05/2021 0733 by Camillia Herter, RN Outcome: Progressing   Problem: Safety: Goal: Ability to remain free from injury will improve 01/05/2021 1024 by Camillia Herter, RN Outcome: Adequate for Discharge 01/05/2021 5188 by Camillia Herter, RN Outcome: Progressing   Problem: Skin Integrity: Goal: Risk for impaired skin integrity will decrease 01/05/2021 1024 by Camillia Herter, RN Outcome: Adequate for Discharge 01/05/2021 0733 by Camillia Herter, RN Outcome: Progressing   Problem: Education: Goal: Understanding of CV disease, CV risk reduction, and recovery process will improve 01/05/2021 1024 by Camillia Herter, RN Outcome: Adequate for Discharge 01/05/2021 0733 by Camillia Herter, RN Outcome: Progressing Goal: Individualized Educational Video(s) 01/05/2021 1024 by Camillia Herter, RN Outcome: Adequate for Discharge 01/05/2021 [phone] by Camillia Herter, RN Outcome: Progressing   Problem: Activity: Goal: Ability to return to baseline activity level will improve 01/05/2021 1024 by Camillia Herter, RN Outcome: Adequate for Discharge 01/05/2021 0630 by Camillia Herter, RN Outcome: Progressing   Problem: Cardiovascular: Goal: Ability to achieve and maintain adequate cardiovascular perfusion will improve 01/05/2021 1024  by Camillia Herter, RN Outcome: Adequate for Discharge 01/05/2021 0733 by Camillia Herter, RN Outcome: Progressing Goal: Vascular access  site(s) Level 0-1 will be maintained 01/05/2021 1024 by Camillia Herter, RN Outcome: Adequate for Discharge 01/05/2021 [phone] by Camillia Herter, RN Outcome: Progressing   Problem: Health Behavior/Discharge Planning: Goal: Ability to safely manage health-related needs after discharge will improve 01/05/2021 1024 by Camillia Herter, RN Outcome: Adequate for Discharge 01/05/2021 [place] by Camillia Herter, RN Outcome: Progressing

## 2021-01-05 NOTE — Telephone Encounter (Signed)
Pt started on DOAC to see Coletta Memos 2/11 for hospital follow up. Follow up in [facility] as needed.

## 2021-01-05 NOTE — Research (Addendum)
Error

## 2021-01-05 NOTE — Progress Notes (Signed)
CARDIAC REHAB PHASE I   PRE:  Rate/Rhythm: 81 SR  BP:  Supine:   Sitting: 125/79  Standing:    SaO2: 96%RA  MODE:  Ambulation: 470 ft   POST:  Rate/Rhythm: 76  BP:  Supine:   Sitting: 128.80  Standing:    SaO2: 97%RA  [id number] Pt walked 470 ft on RA with steady gait. No CP. Gave pt low sodium and heart healthy diets. Encouraged watching sodium and weighing daily due to low EF. Encouraged 2000 mg sodium restriction. Encouraged walking as tolerated. Reviewed NTG use. Pt has attended CRP 2 [place] before. Knows if he wants to attend again he can discuss with cardiologist as he had unsuccessful attempt. Pt stated he does not feel that he needs to attend at this time but will discuss with Dr [place]ique if he needs program in future. Gave smoking cessation handout and encouraged to call [phone] if needed. Pt has quit several times for 3 to 6 years.  Pt voiced understanding of all ed.    Graylon Good, RN BSN  01/05/2021 9:43 AM

## 2021-01-05 NOTE — Discharge Instructions (Addendum)
Information on my medicine - ELIQUIS (apixaban)  Why was Eliquis prescribed for you? Eliquis was prescribed for you to reduce the risk of a blood clot forming that can cause a stroke if you have a medical condition called atrial fibrillation (a type of irregular heartbeat).  What do You need to know about Eliquis ? Take your Eliquis TWICE DAILY - one tablet in the morning and one tablet in the evening with or without food. If you have difficulty swallowing the tablet whole please discuss with your pharmacist how to take the medication safely.  Take Eliquis exactly as prescribed by your doctor and DO NOT stop taking Eliquis without talking to the doctor who prescribed the medication.  Stopping may increase your risk of developing a stroke.  Refill your prescription before you run out.  After discharge, you should have regular check-up appointments with your healthcare provider that is prescribing your Eliquis.  In the future your dose may need to be changed if your kidney function or weight changes by a significant amount or as you get older.  What do you do if you miss a dose? If you miss a dose, take it as soon as you remember on the same day and resume taking twice daily.  Do not take more than one dose of ELIQUIS at the same time to make up a missed dose.  Important Safety Information A possible side effect of Eliquis is bleeding. You should call your healthcare provider right away if you experience any of the following: ? Bleeding from an injury or your nose that does not stop. ? Unusual colored urine (red or dark brown) or unusual colored stools (red or black). ? Unusual bruising for unknown reasons. ? A serious fall or if you hit your head (even if there is no bleeding).  Some medicines may interact with Eliquis and might increase your risk of bleeding or clotting while on Eliquis. To help avoid this, consult your healthcare provider or pharmacist prior to using any new  prescription or non-prescription medications, including herbals, vitamins, non-steroidal anti-inflammatory drugs (NSAIDs) and supplements.  This website has more information on Eliquis (apixaban): http://www.eliquis.com/eliquis/home   DO NOT TAKE SILDENAFIL (VIAGRA) AND SUBLINGUAL NITROGLYCERIN AT THE SAME TIME - HAVE TO SEPARATE BY 24 HOURS - CAN CAUSE LOW BLOOD PRESSURE IF TAKEN TOGETHER!

## 2021-01-05 NOTE — Plan of Care (Signed)
°  Problem: Clinical Measurements: °Goal: Respiratory complications will improve °Outcome: Progressing °Goal: Cardiovascular complication will be avoided °Outcome: Progressing °  °Problem: Activity: °Goal: Risk for activity intolerance will decrease °Outcome: Progressing °  °

## 2021-01-05 NOTE — Progress Notes (Addendum)
ANTICOAGULATION CONSULT NOTE - Follow-Up Consult  Pharmacy Consult for IV Heparin Indication: chest pain/ACS  No Known Allergies  Patient Measurements: Height: 6\' 1"  (185.4 cm) Weight: 97.5 kg (215 lb) IBW/kg (Calculated) : 79.9  Heparin Dosing Weight: 97.5 kg  Vital Signs: Temp: 98.3 F (36.8 C) (02/02 0512) Temp Source: Oral (02/02 0512) BP: 126/81 (02/02 0512) Pulse Rate: 85 (02/02 0512)  Labs: Recent Labs    01/03/21 1533 01/03/21 1951 01/04/21 0236 01/04/21 1235 01/05/21 0259  HGB 16.9  --  16.5  --  15.8  HCT 49.1  --  48.7  --  48.9  PLT 262  --  275  --  232  LABPROT  --   --  13.5  --   --   INR  --   --  1.1  --   --   HEPARINUNFRC  --   --   --  <0.10* <0.10*  CREATININE 0.98  --  0.91  --  0.83  TROPONINIHS 20* 23*  --   --   --     Estimated Creatinine Clearance: 110.5 mL/min (by C-G formula based on SCr of 0.83 mg/dL).   Medical History: Past Medical History:  Diagnosis Date  . Acute systolic CHF (congestive heart failure) ([place])   . AICD (automatic cardioverter/defibrillator) present   . Anxiety   . Ascending aortic aneurysm ([place]) 08/31/2017  . Back pain 08/31/2017  . Basal cell carcinoma    left arm  . CAD (coronary artery disease)    a. 2000: s/p stent of RCA 2000 with BMS  b. 2015: STEMI s/p LHC with old occlusion of RCA and DESx2 to LAD  c. 2/0/25: complicated PCI on 3/2 for CTO of mid RCA with coronary perforation and cardiac tamponade requiring emergent pericardiocentesis     . Cardiac tamponade    a. 02/03/15 2/2 coronary perforation during CTO procedure. Sealed with graftmaster coated stent.   Marland Kitchen COPD (chronic obstructive pulmonary disease) ([place]) 08/31/2017  . Gout   . History of pneumonia   . HTN (hypertension)   . Hyperlipidemia   . Ischemic cardiomyopathy    a. 2D ECHO: EF 25-30%. Akinesis of the anteroseptal and  . Left main coronary artery disease   . Myocardial infarction ([place])   . Obesity   . 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 Shortness of breath dyspnea   . Tobacco abuse   . Urinary frequency    Assessment: 65 yr old male with significant cardiac history presented to the ED with CP, unstable angina.  Pharmacy was consulted to dose IV heparin for AFib.  Pt was on no anticoagulant PTA.  Underwent cardiac cath on 2/2 with unsuccessful attempt at PCI of ost Cx. Heparin level remains undetectable, on 1200 units/hr. Hgb 15.8, plt 232. No s/sx of bleeding or infusion issues.   Goal of Therapy:  Heparin level 0.3-0.7 units/ml Monitor platelets by anticoagulation protocol: Yes   Plan:  Increase heparin infusion to 1400 units/hr Check heparin level 6 hrs  Monitor daily heparin level, CBC Monitor for bleeding  Antonietta Jewel, PharmD, [place] Pharmacist  Phone: [phone] 01/05/2021 7:33 AM  Please check AMION for all [place] phone numbers After 10:00 PM, call [place] to transition to apixaban from heparin infusion - discontinue heparin infusion and start apixaban 5 mg BID (age<80, Scr<1.5, wt >60 kg). Will monitor CBC  and for s/sx of bleeding.  Antonietta Jewel, PharmD, [place] Clinical Pharmacist

## 2021-01-05 NOTE — Progress Notes (Signed)
TR BAND REMOVAL  LOCATION:    left radial  DEFLATED PER PROTOCOL:    Yes.    TIME BAND OFF / DRESSING APPLIED:   2030   SITE UPON ARRIVAL:    Level 0  SITE AFTER BAND REMOVAL:    Level 0  CIRCULATION SENSATION AND MOVEMENT:    Within Normal Limits   Yes.    COMMENTS:    Pt.tolerated well

## 2021-01-05 NOTE — Plan of Care (Signed)

## 2021-01-13 ENCOUNTER — Ambulatory Visit (INDEPENDENT_AMBULATORY_CARE_PROVIDER_SITE_OTHER): Payer: Medicaid Other

## 2021-01-13 DIAGNOSIS — I255 Ischemic cardiomyopathy: Secondary | ICD-10-CM

## 2021-01-13 DIAGNOSIS — I5022 Chronic systolic (congestive) heart failure: Secondary | ICD-10-CM

## 2021-01-13 LAB — CUP PACEART REMOTE DEVICE CHECK
Battery Remaining Percentage: 47 %
Battery Voltage: 3.1 V
Brady Statistic RV Percent Paced: 0 %
Date Time Interrogation Session: 20220210094837
HighPow Impedance: 91 Ohm
Implantable Lead Implant Date: 20150515
Implantable Lead Location: 753860
Implantable Lead Model: 365
Implantable Lead Serial Number: 10579264
Implantable Pulse Generator Implant Date: 20150515
Lead Channel Impedance Value: 666 Ohm
Lead Channel Sensing Intrinsic Amplitude: 20 mV
Lead Channel Sensing Intrinsic Amplitude: 8 mV
Lead Channel Setting Pacing Amplitude: 2.5 V
Lead Channel Setting Pacing Pulse Width: 0.4 ms
Lead Channel Setting Sensing Sensitivity: 0.8 mV
Pulse Gen Model: 383594
Pulse Gen Serial Number: 60800912

## 2021-01-13 NOTE — Progress Notes (Deleted)
Cardiology Clinic Note   Patient Name: Kerry Mason Date of Encounter: 01/13/2021  Primary Care Provider:  Pcp, No Primary Cardiologist:  Peter Martinique, MD  Patient Profile    Kerry Mason 65 year old male presents the clinic today for follow-up evaluation of his coronary artery disease.  Past Medical History    Past Medical History:  Diagnosis Date  . Acute systolic CHF (congestive heart failure) ([place])   . AICD (automatic cardioverter/defibrillator) present   . Anxiety   . Ascending aortic aneurysm ([place]) 08/31/2017  . Back pain 08/31/2017  . Basal cell carcinoma    left arm  . CAD (coronary artery disease)    a. 2000: s/p stent of RCA 2000 with BMS  b. 2015: STEMI s/p LHC with old occlusion of RCA and DESx2 to LAD  c. 04/05/60: complicated PCI on 3/2 for CTO of mid RCA with coronary perforation and cardiac tamponade requiring emergent pericardiocentesis     . Cardiac tamponade    a. 02/03/15 2/2 coronary perforation during CTO procedure. Sealed with graftmaster coated stent.   Marland Kitchen COPD (chronic obstructive pulmonary disease) ([place]) 08/31/2017  . Gout   . History of pneumonia   . HTN (hypertension)   . Hyperlipidemia   . Ischemic cardiomyopathy    a. 2D ECHO: EF 25-30%. Akinesis of the anteroseptal and  . Left main coronary artery disease   . Myocardial infarction ([place])   . Obesity   . 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 Shortness of breath dyspnea   . Tobacco abuse   . Urinary frequency    Past Surgical History:  Procedure Laterality Date  . CARDIAC CATHETERIZATION    . CARDIAC CATHETERIZATION N/A 06/14/2015   Procedure: Left Heart Cath and Coronary Angiography;  Surgeon: Lorretta Harp, MD;  Location: [place] [facility];  Service: Cardiovascular;  Laterality: N/A;  . CARDIAC CATHETERIZATION N/A 08/18/2015   Procedure: Intravascular Pressure Wire/FFR Study;  Surgeon: Peter M Martinique, MD;  Location: [place] [facility];  Service: Cardiovascular;  Laterality: N/A;  . COLONOSCOPY    . CORONARY ANGIOPLASTY  2000  . CORONARY ANGIOPLASTY WITH STENT PLACEMENT  2015  . CORONARY ARTERY BYPASS GRAFT N/A 09/02/2015   Procedure: CORONARY ARTERY BYPASS GRAFTING (CABG) x 2 (LIMA-LAD, SVG-Intermediate) ENDOSCOPIC GREATER SAPHENOUS VEIN HARVEST RIGHT THIGH;  Surgeon: Rexene Alberts, MD;  Location: [place];  Service: Open Heart Surgery;  Laterality: N/A;  . CORONARY BALLOON ANGIOPLASTY N/A 01/04/2021   Procedure: CORONARY BALLOON ANGIOPLASTY;  Surgeon: Jettie Booze, MD;  Location: [place] [facility];  Service: Cardiovascular;  Laterality: N/A;  . FOOT SURGERY    . IMPLANTABLE CARDIOVERTER DEFIBRILLATOR IMPLANT N/A 04/17/2014   Procedure: IMPLANTABLE CARDIOVERTER DEFIBRILLATOR IMPLANT;  Surgeon: Evans Lance, MD;  [facility] C[facility] LAB;  Service: Cardiovascular;  Laterality: N/A;  . LEFT HEART CATH AND CORONARY ANGIOGRAPHY N/A 01/04/2021   Procedure: LEFT HEART CATH AND CORONARY ANGIOGRAPHY;  Surgeon: Jettie Booze, MD;  Location: [place] [facility];  Service: Cardiovascular;  Laterality: N/A;  . LEFT HEART CATH AND CORS/GRAFTS ANGIOGRAPHY N/A 08/30/2017   Procedure: LEFT HEART CATH AND CORS/GRAFTS ANGIOGRAPHY;  Surgeon: Nelva Bush, MD;  Location: [place] [facility];  Service: Cardiovascular;  Laterality: N/A;  . LEFT HEART CATHETERIZATION WITH CORONARY ANGIOGRAM N/A 12/11/2013   Procedure: LEFT HEART CATHETERIZATION WITH CORONARY ANGIOGRAM;  Surgeon: Peter M Martinique, MD;  Location: [facility]  CATH LAB;  Service: Cardiovascular;  Laterality: N/A;  . LEFT HEART CATHETERIZATION WITH CORONARY ANGIOGRAM N/A 01/05/2015   Procedure: LEFT HEART CATHETERIZATION WITH CORONARY ANGIOGRAM;  Surgeon: Peter M Martinique, MD;  Location: [facility] CATH LAB;  Service: Cardiovascular;  Laterality: N/A;  . PERCUTANEOUS CORONARY STENT INTERVENTION (PCI-S)  12/11/2013   Procedure: PERCUTANEOUS CORONARY STENT INTERVENTION (PCI-S);  Surgeon: Peter M Martinique,  MD;  Location: [facility] CATH LAB;  Service: Cardiovascular;;  prov LAD and mid LAD  . PERCUTANEOUS CORONARY STENT INTERVENTION (PCI-S) N/A 02/03/2015   Procedure: PERCUTANEOUS CORONARY STENT INTERVENTION (PCI-S);  Surgeon: Jettie Booze, MD;  Location: [facility] CATH LAB;  Service: Cardiovascular;  Laterality: N/A;  . TEE WITHOUT CARDIOVERSION N/A 09/02/2015   Procedure: TRANSESOPHAGEAL ECHOCARDIOGRAM (TEE);  Surgeon: Rexene Alberts, MD;  Location: [place];  Service: Open Heart Surgery;  Laterality: N/A;    Allergies  No Known Allergies  History of Present Illness    Mr. Reppond has a PMH of coronary artery disease status post two-vessel CABG 2016, cardiac catheterization 2018 with patent LIMA-LAD, SVG-ramus, and known occluded RCA with left-to-right collaterals, chronic systolic heart failure status post ICD 2015, hypertension, hyperlipidemia, and tobacco use.  He presented to the emergency department with chest pain 01/04/21.  He reported that he had awakened that morning with bilateral squeezing pressure in his chest and took 2 nitroglycerin.  The pain resolved and he went to work at [employer].  He did well until mid morning until he again developed chest pain/pressure.  He was chest pain-free on assessment and after taking a third nitroglycerin.  His echocardiogram showed an LVEF of 25-30%, G1 DD, severely dilated left atria, trivial mitral valve regurgitation, mild calcification of the aortic valve, and aortic root dilation measuring 39 mm.  He underwent cardiac catheterization 01/04/2021.  There was an unsuccessful attempt at PCI of the ostial circumflex.  Medical management was recommended.  He presents the clinic today for follow-up evaluation and states***  *** denies chest pain, shortness of breath, lower extremity edema, fatigue, palpitations, melena, hematuria, hemoptysis, diaphoresis, weakness, presyncope, syncope, orthopnea, and PND.   Home Medications    Prior to Admission  medications   Medication Sig Start Date End Date Taking? Authorizing Provider  acetaminophen (TYLENOL) 325 MG tablet Take 2 tablets (650 mg total) by mouth every 4 (four) hours as needed for headache or mild pain. 06/16/15   Erlene Quan, PA-C  allopurinol (ZYLOPRIM) 300 MG tablet Take 1 tablet (300 mg total) by mouth daily. 03/05/19   Martinique, Peter M, MD  apixaban (ELIQUIS) 5 MG TABS tablet Take 1 tablet (5 mg total) by mouth 2 (two) times daily. 01/05/21   Cheryln Manly, NP  aspirin EC 81 MG tablet Take 81 mg by mouth daily.    [provider]  atorvastatin (LIPITOR) 80 MG tablet Take 1 tablet (80 mg total) by mouth daily. 01/05/21 07/04/21  Cheryln Manly, NP  carvedilol (COREG) 6.25 MG tablet Take 1 tablet (6.25 mg total) by mouth 2 (two) times daily. 01/05/21   Cheryln Manly, NP  ezetimibe (ZETIA) 10 MG tablet Take 1 tablet (10 mg total) by mouth daily. 01/05/21   Cheryln Manly, NP  furosemide (LASIX) 40 MG tablet Take 1 tablet (40 mg total) by mouth daily. 01/05/21   Cheryln Manly, NP  isosorbide mononitrate (IMDUR) 30 MG 24 hr tablet Take 1 tablet (30 mg total) by mouth daily. 01/05/21   Cheryln Manly, NP  lisinopril (ZESTRIL) 5 MG tablet Take 1 tablet (5 mg total) by mouth daily. 03/18/20   Martinique, Peter M, MD  nitroGLYCERIN (NITROSTAT) 0.4 MG SL tablet DISSOLVE ONE TABLET UNDER THE TONGUE EVERY 5 MINUTES AS NEEDED FOR CHEST PAIN.  DO NOT EXCEED A TOTAL OF 3 DOSES IN 15 MINUTES 01/05/21   Reino Bellis B, NP  predniSONE (STERAPRED UNI-PAK 48 TAB) 10 MG (48) TBPK tablet Take by mouth as directed. 12/21/20   [provider]  sildenafil (VIAGRA) 50 MG tablet Take 1 tablet (50 mg total) by mouth daily as needed for erectile dysfunction. 08/21/18   Martinique, Peter M, MD  Spacer/Aero-Holding Chambers (AEROCHAMBER MV) inhaler Use as instructed 10/03/17   Magdalen Spatz, NP  spironolactone (ALDACTONE) 25 MG tablet Take 1 tablet (25 mg total) by mouth at bedtime. Patient  taking differently: Take 25 mg by mouth daily after breakfast. 03/18/20   Martinique, Peter M, MD  budesonide-formoterol [facility]) 160-4.5 MCG/ACT inhaler Inhale 2 puffs into the lungs 2 (two) times daily. Patient not taking: Reported on 06/21/2020 09/05/17 06/21/20  Chesley Mires, MD  tiotropium (SPIRIVA) 18 MCG inhalation capsule Place 1 capsule (18 mcg total) into inhaler and inhale daily. Patient not taking: Reported on 06/21/2020 09/01/17 06/21/20  Isaiah Serge, NP    Family History    Family History  Problem Relation Age of Onset  . CAD Father        PTCA  . Cancer Mother        LYMPHOMA   He indicated that his mother is deceased. He indicated that his father is deceased. He indicated that his maternal grandmother is deceased. He indicated that his maternal grandfather is deceased. He indicated that his paternal grandmother is deceased. He indicated that his paternal grandfather is deceased.  Social History    Social History   Socioeconomic History  . Marital status: Married    Spouse name: Not on file  . Number of children: 2  . Years of education: Not on file  . Highest education level: Not on file  Occupational History  . 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 Smoking status: Current Every Day Smoker    Packs/day: 0.50    Years: 35.00    Pack years: 17.50    Types: Cigarettes  . Smokeless tobacco: Never Used  Vaping Use  . Vaping Use: Never used  Substance and Sexual Activity  . Alcohol use: No    Alcohol/week: 0.0 standard drinks  . Drug use: No  . Sexual activity: Not Currently  Other Topics Concern  . Not on file  Social History Narrative  . Not on file   Social Determinants of Health   Financial Resource Strain: Not on file  Food Insecurity: Not on file  Transportation Needs: Not on file  Physical Activity: Not on file  Stress: Not on file  Social Connections: Not on file  Intimate Partner Violence: Not on file     Review of Systems     General:  No chills, fever, night sweats or weight changes.  Cardiovascular:  No chest pain, dyspnea on exertion, edema, orthopnea, palpitations, paroxysmal nocturnal dyspnea. Dermatological: No rash, lesions/masses Respiratory: No cough, dyspnea Urologic: No hematuria, dysuria Abdominal:   No nausea, vomiting, diarrhea, bright red blood per rectum, melena, or hematemesis Neurologic:  No visual changes, wkns, changes in mental status. All other systems reviewed and are otherwise negative except as noted above.  Physical Exam    VS:  There were no vitals taken for this visit. , BMI There is no height or weight on file to calculate BMI. GEN: Well nourished, well developed, in no acute distress. HEENT: normal. Neck: Supple, no JVD, carotid bruits, or masses. Cardiac: RRR, no murmurs, rubs, or gallops. No clubbing, cyanosis, edema.  Radials/DP/PT 2+ and equal bilaterally.  Respiratory:  Respirations regular and unlabored, clear to auscultation bilaterally. GI: Soft, nontender, nondistended, BS + x 4. MS: no deformity or atrophy. Skin: warm and dry, no rash. Neuro:  Strength and sensation are intact. Psych: Normal affect.  Accessory Clinical Findings    Recent Labs: 06/20/2020: ALT 14 01/05/2021: BUN 17; Creatinine, Ser 0.83; Hemoglobin 15.8; Platelets 232; Potassium 4.0; Sodium 135   Recent Lipid Panel    Component Value Date/Time   CHOL 181 01/04/2021 0236   CHOL 187 03/18/2020 1522   TRIG 169 (H) 01/04/2021 0236   HDL 42 01/04/2021 0236   HDL 35 (L) 03/18/2020 1522   CHOLHDL 4.3 01/04/2021 0236   VLDL 34 01/04/2021 0236   LDLCALC 105 (H) 01/04/2021 0236   LDLCALC 118 (H) 03/18/2020 1522    ECG personally reviewed by me today- *** - No acute changes  Echocardiogram 01/04/2021 IMPRESSIONS    1. There is no left ventricular thrombus (Definity contrast was used).  There is apical dyskinesis. There is severe inferior wall hypokinesis and  inferoseptal akinesis.. Left  ventricular ejection fraction, by estimation,  is 25 to 30%. The left ventricle  has severely decreased function. The left ventricle demonstrates regional  wall motion abnormalities (see scoring diagram/findings for description).  The left ventricular internal cavity size was moderately to severely  dilated. Left ventricular diastolic  parameters are consistent with Grade I diastolic dysfunction (impaired  relaxation).  2. Right ventricular systolic function is normal. The right ventricular  size is normal. There is normal pulmonary artery systolic pressure. The  estimated right ventricular systolic pressure is 34.9 mmHg.  3. Left atrial size was severely dilated.  4. The mitral valve is normal in structure. Trivial mitral valve  regurgitation.  5. The aortic valve is tricuspid. There is mild calcification of the  aortic valve. There is mild thickening of the aortic valve. Aortic valve  regurgitation is not visualized. Mild aortic valve sclerosis is present,  with no evidence of aortic valve  stenosis.  6. Aortic dilatation noted. There is mild dilatation of the aortic root,  measuring 39 mm.  7. The inferior vena cava is normal in size with greater than 50%  respiratory variability, suggesting right atrial pressure of 3 mmHg.   Comparison(s): Prior images unable to be directly viewed, comparison made  by report only. The left ventricular function is worsened. Multiple  previous reports from 2016 describe wall motion abnormalities in either  the right coronary artery territory or  the LAD artery territory (although not at the same time). The apex was  previously described as akinetic. On the current study there is evidence  of prior infarction in both RCA and LAD territory with a frankly  dyskinetic apex.   Cardiac catheterization 01/04/2021  Cath: 01/04/21   LM lesion is 50% stenosed. Patent LIMA to LAD. Patent SVG to ramus.  2nd Diag lesion is 70% stenosed.  Mid RCA to  Dist RCA lesion is 100% stenosed.  Prox RCA to Mid RCA lesion is 100% stenosed.  Ost RCA to Prox RCA lesion is 95% stenosed.  Ost Cx lesion is 80% stenosed. This territory is not bypassed, but it  is a relatively small vessel. Unable to advance balloon after successfully guiding the wire though the ramus graft and retrograde to the ostial circumflex and across the lesion.  The left ventricular ejection fraction is 25-35% by visual estimate.  There is moderate to severe left ventricular systolic dysfunction.  LV end diastolic pressure is normal.  There is no aortic valve stenosis.  Medical therapy for CAD. Unsuccessful attempt at PCI of ostial circumflex. Restart IV heparin 8 hours post sheath pull. OK to start [place] tomorrow for atrial fibrillation. He was loaded with Plavix but will not continue if he is starting a DOAC. If he is not starting a DOAC, DAPT would be reasonable for his CAD.   Diagnostic Dominance: Right     Assessment & Plan   1.  Coronary artery disease/unstable angina-no chest pain today.  Underwent cardiac catheterization 01/04/2021.  It was an unsuccessful attempt at PCI of the ostial circumflex.  He was noted to have patent LIMA-LAD, SVG-ramus, and had a known occluded RCA. Continue aspirin, apixaban, carvedilol, atorvastatin, Zetia, Imdur, lisinopril, nitroglycerin, spironolactone Heart healthy low-sodium diet-salty 6 given Increase physical activity as tolerated  Chronic combined systolic and diastolic CHF-no increased DOE or activity intolerance today.  Status post ICD followed by Dr. [name].  Echocardiogram showed LVEF of 25-30%, with severe inferior wall hypokinesis and inferior septal hypokinesis.  Prior authorization for [facility] submitted prior to discharge. Continue carvedilol, spironolactone Start Entresto-24/26 Stop lisinopril Heart healthy low-sodium diet-salty 6 given Increase physical activity as tolerated Order BMP in 1 week. Plan to  repeat echocardiogram once medication has been optimized for 1 month.  Paroxysmal atrial fibrillation-heart rate today ***.  Identified via device alert 12/27/2020.  Dr. [name] plan to refer to A. fib clinic.  No episodes of atrial fibrillation identified during hospital admission.  CHA2DS2-VASc score 3.  No bleeding issues. Continue apixaban, aspirin Avoid triggers caffeine, chocolate, EtOH, dehydration etc. Follow-up with A. fib clinic Essential hypertension-BP today***.  Well-controlled at home. Continue carvedilol, spironolactone Start Entresto Heart healthy low-sodium diet-salty 6 given Increase physical activity as tolerated  Hyperlipidemia-01/04/2021: Cholesterol 181; HDL 42; LDL Cholesterol 105; Triglycerides 169; VLDL 34 Continue atorvastatin, Zetia Heart healthy low-sodium diet-salty 6 given Increase physical activity as tolerated Repeat fasting lipid and LFTs in 4 weeks.  Disposition: Follow-up with Dr. Martinique or me in 1 month.  Jossie Ng. Taleigha Pinson NP-C    01/13/2021, 6:40 AM [address] Office [phone] Fax [phone]  Notice: This dictation was prepared with Dragon dictation along with smaller phrase technology. Any transcriptional errors that result from this process are unintentional and may not be corrected upon review.  I spent***minutes examining this patient, reviewing medications, and using patient centered shared decision making involving her cardiac care.  Prior to her visit I spent greater than 20 minutes reviewing her past medical history,  medications, and prior cardiac tests.

## 2021-01-14 ENCOUNTER — Ambulatory Visit: Payer: Medicaid Other | Admitting: General Practice

## 2021-01-17 NOTE — Progress Notes (Unsigned)
Cardiology Office Note:    Date:  01/18/2021   ID:  Kerry Mason, DOB [birth year], MRN [id number]  PCP:  Pcp, No  Cardiologist:  Peter Martinique, MD   Referring MD: No ref. provider found   Chief Complaint  Patient presents with  . Follow-up  post cath New Afib  History of Present Illness:    Kerry Mason is a 65 y.o. male with a hx of CAD s/p CABG x 2 and multiple PCI, chronic systolic heart failure s/p ICD 2015, HTN, and tobacco use. Remote stenting of RCA in 2000, then lost to follow up. Anterior STEMI Jan 2015 treated with DES to LAD. EF by cath was 35% and ICD was placed. Abnormal nuclear stress test 11/2014 led to heart cath and subsequent CTO of RCA. Procedure was complicated by perforation of mid RCA and cardiac tamponade that required emergent pericardiocentesis with removal of 1L of blood. Perforation sealed and covered with stent to the mid RCA; remainder of RCA wa sstented with 4 additional DES. Pt suffered pericarditis after this PCI and developed PAF. He had a NSTEMI 06/2015 with reocclusion of mid RCA, treated medically with imdur. Left main FFR was abnormal and he was referred to CT surgery. CABG x 2 08/2015 with LIMA-LAD and SVG-ramus. RCA not bypassed. He had recurrence of chest pain 08/2017 leading to heart cath that showed patent LIMA-LAD, SVG-ramus, and occluded RCA with L-R collaterals. There were multiple abnormal vessels arising from LIMA graft, mediastinal mass was a possibility. CT chest showed 4.1 cm AAA. He was last seen by Dr. 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 He follows with [facility] for abdominal aortic aneurysm 4.9 cm.   He presented to [facility] with chest pain. Symptoms somewhat atypical with flat HST. Given his known CAD, heart cath was repeated. PCI attempted of ostial LCx, but was unsuccessful. He was loaded with plavix in the cath lab, but this was not continued given his need for DOAC.   He presents today for cath follow up. He states he has  lost the majority of his energy. No syncope, but feels like he has no energy. He does not report symptoms consistent with orthostatic hypotension. He reports resolution of his palpitations. He denies active bleeding.   He also reports that he is unable to sleep more than 2 hrs nightly since his heart cath. This is likely contributing to his lack of energy, but may need a sleep study.    Past Medical History:  Diagnosis Date  . Acute systolic CHF (congestive heart failure) ([place])   . AICD (automatic cardioverter/defibrillator) present   . Anxiety   . Ascending aortic aneurysm ([place]) 08/31/2017  . Back pain 08/31/2017  . Basal cell carcinoma    left arm  . CAD (coronary artery disease)    a. 2000: s/p stent of RCA 2000 with BMS  b. 2015: STEMI s/p LHC with old occlusion of RCA and DESx2 to LAD  c. 07/10/85: complicated PCI on 3/2 for CTO of mid RCA with coronary perforation and cardiac tamponade requiring emergent pericardiocentesis     . Cardiac tamponade    a. 02/03/15 2/2 coronary perforation during CTO procedure. Sealed with graftmaster coated stent.   Marland Kitchen COPD (chronic obstructive pulmonary disease) ([place]) 08/31/2017  . Gout   . History of pneumonia   . HTN (hypertension)   . Hyperlipidemia   . Ischemic cardiomyopathy    a. 2D ECHO: EF 25-30%. Akinesis of the anteroseptal and  . Left main  coronary artery disease   . Myocardial infarction ([place])   . Obesity   . 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 Shortness of breath dyspnea   . Tobacco abuse   . Urinary frequency     Past Surgical History:  Procedure Laterality Date  . CARDIAC CATHETERIZATION    . CARDIAC CATHETERIZATION N/A 06/14/2015   Procedure: Left Heart Cath and Coronary Angiography;  Surgeon: Lorretta Harp, MD;  Location: [place] [facility];  Service: Cardiovascular;  Laterality: N/A;  . CARDIAC CATHETERIZATION N/A 08/18/2015   Procedure: Intravascular Pressure Wire/FFR  Study;  Surgeon: Peter M Martinique, MD;  Location: [place] [facility];  Service: Cardiovascular;  Laterality: N/A;  . COLONOSCOPY    . CORONARY ANGIOPLASTY  2000  . CORONARY ANGIOPLASTY WITH STENT PLACEMENT  2015  . CORONARY ARTERY BYPASS GRAFT N/A 09/02/2015   Procedure: CORONARY ARTERY BYPASS GRAFTING (CABG) x 2 (LIMA-LAD, SVG-Intermediate) ENDOSCOPIC GREATER SAPHENOUS VEIN HARVEST RIGHT THIGH;  Surgeon: Rexene Alberts, MD;  Location: [place];  Service: Open Heart Surgery;  Laterality: N/A;  . CORONARY BALLOON ANGIOPLASTY N/A 01/04/2021   Procedure: CORONARY BALLOON ANGIOPLASTY;  Surgeon: Jettie Booze, MD;  Location: [place] [facility];  Service: Cardiovascular;  Laterality: N/A;  . FOOT SURGERY    . IMPLANTABLE CARDIOVERTER DEFIBRILLATOR IMPLANT N/A 04/17/2014   Procedure: IMPLANTABLE CARDIOVERTER DEFIBRILLATOR IMPLANT;  Surgeon: Evans Lance, MD;  Location: [facility] CATH LAB;  Service: Cardiovascular;  Laterality: N/A;  . LEFT HEART CATH AND CORONARY ANGIOGRAPHY N/A 01/04/2021   Procedure: LEFT HEART CATH AND CORONARY ANGIOGRAPHY;  Surgeon: Jettie Booze, MD;  Location: [place] [facility];  Service: Cardiovascular;  Laterality: N/A;  . LEFT HEART CATH AND CORS/GRAFTS ANGIOGRAPHY N/A 08/30/2017   Procedure: LEFT HEART CATH AND CORS/GRAFTS ANGIOGRAPHY;  Surgeon: Nelva Bush, MD;  Location: [place] [facility];  Service: Cardiovascular;  Laterality: N/A;  . LEFT HEART CATHETERIZATION WITH CORONARY ANGIOGRAM N/A 12/11/2013   Procedure: LEFT HEART CATHETERIZATION WITH CORONARY ANGIOGRAM;  Surgeon: Peter M Martinique, MD;  Location: [facility] CATH LAB;  Service: Cardiovascular;  Laterality: N/A;  . LEFT HEART CATHETERIZATION WITH CORONARY ANGIOGRAM N/A 01/05/2015   Procedure: LEFT HEART CATHETERIZATION WITH CORONARY ANGIOGRAM;  Surgeon: Peter M Martinique, MD;  Location: [facility] CATH LAB;  Service: Cardiovascular;  Laterality: N/A;  . PERCUTANEOUS CORONARY STENT INTERVENTION (PCI-S)  12/11/2013   Procedure: PERCUTANEOUS  CORONARY STENT INTERVENTION (PCI-S);  Surgeon: Peter M Martinique, MD;  Location: [facility] CATH LAB;  Service: Cardiovascular;;  prov LAD and mid LAD  . PERCUTANEOUS CORONARY STENT INTERVENTION (PCI-S) N/A 02/03/2015   Procedure: PERCUTANEOUS CORONARY STENT INTERVENTION (PCI-S);  Surgeon: Jettie Booze, MD;  Location: [facility] CATH LAB;  Service: Cardiovascular;  Laterality: N/A;  . TEE WITHOUT CARDIOVERSION N/A 09/02/2015   Procedure: TRANSESOPHAGEAL ECHOCARDIOGRAM (TEE);  Surgeon: Rexene Alberts, MD;  Location: [facility];  Service: Open Heart Surgery;  Laterality: N/A;    Current Medications: Current Meds  Medication Sig  . acetaminophen (TYLENOL) 325 MG tablet Take 2 tablets (650 mg total) by mouth every 4 (four) hours as needed for headache or mild pain.  Marland Kitchen allopurinol (ZYLOPRIM) 300 MG tablet Take 1 tablet (300 mg total) by mouth daily.  Marland Kitchen apixaban (ELIQUIS) 5 MG TABS tablet Take 1 tablet (5 mg total) by mouth 2 (two) times daily.  Marland Kitchen aspirin EC 81 MG tablet Take 81 mg by mouth  daily.  . atorvastatin (LIPITOR) 80 MG tablet Take 1 tablet (80 mg total) by mouth daily.  . carvedilol (COREG) 6.25 MG tablet Take 1 tablet (6.25 mg total) by mouth 2 (two) times daily.  Marland Kitchen ezetimibe (ZETIA) 10 MG tablet Take 1 tablet (10 mg total) by mouth daily.  . furosemide (LASIX) 40 MG tablet Take 1 tablet (40 mg total) by mouth daily.  . isosorbide mononitrate (IMDUR) 30 MG 24 hr tablet Take 1 tablet (30 mg total) by mouth daily.  Marland Kitchen lisinopril (ZESTRIL) 5 MG tablet Take 1 tablet (5 mg total) by mouth daily.  . nitroGLYCERIN (NITROSTAT) 0.4 MG SL tablet DISSOLVE ONE TABLET UNDER THE TONGUE EVERY 5 MINUTES AS NEEDED FOR CHEST PAIN.  DO NOT EXCEED A TOTAL OF 3 DOSES IN 15 MINUTES  . sildenafil (VIAGRA) 50 MG tablet Take 1 tablet (50 mg total) by mouth daily as needed for erectile dysfunction.  Marland Kitchen Spacer/Aero-Holding Chambers (AEROCHAMBER MV) inhaler Use as instructed  . spironolactone (ALDACTONE) 25 MG tablet Take 25 mg by mouth  daily. 1Tablet Daily  . [DISCONTINUED] predniSONE (STERAPRED UNI-PAK 48 TAB) 10 MG (48) TBPK tablet Take by mouth as directed.   Current Facility-Administered Medications for the 01/18/21 encounter (Office Visit) with Ledora Bottcher, PA  Medication  . [START ON 01/19/2021] nicotine (NICODERM CQ - dosed in mg/24 hr) patch 14 mg     Allergies:   Patient has no known allergies.   Social History   Socioeconomic History  . Marital status: Married    Spouse name: Not on file  . Number of children: 2  . Years of education: Not on file  . Highest education level: Not on file  Occupational History  . 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 Smoking status: Current Every Day Smoker    Packs/day: 0.50    Years: 35.00    Pack years: 17.50    Types: Cigarettes  . Smokeless tobacco: Never Used  Vaping Use  . Vaping Use: Never used  Substance and Sexual Activity  . Alcohol use: No    Alcohol/week: 0.0 standard drinks  . Drug use: No  . Sexual activity: Not Currently  Other Topics Concern  . Not on file  Social History Narrative  . Not on file   Social Determinants of Health   Financial Resource Strain: Not on file  Food Insecurity: Not on file  Transportation Needs: Not on file  Physical Activity: Not on file  Stress: Not on file  Social Connections: Not on file     Family History: The patient's family history includes CAD in his father; Cancer in his mother.  ROS:   Please see the history of present illness.     All other systems reviewed and are negative.  EKGs/Labs/Other Studies Reviewed:    The following studies were reviewed today:  Cath: 01/04/21   LM lesion is 50% stenosed. Patent LIMA to LAD. Patent SVG to ramus.  2nd Diag lesion is 70% stenosed.  Mid RCA to Dist RCA lesion is 100% stenosed.  Prox RCA to Mid RCA lesion is 100% stenosed.  Ost RCA to Prox RCA lesion is 95% stenosed.  Ost Cx lesion is 80% stenosed. This territory is not  bypassed, but it is a relatively small vessel. Unable to advance balloon after successfully guiding the wire though the ramus graft and retrograde to the ostial circumflex and across the lesion.  The left ventricular ejection fraction is 25-35% by visual estimate.  There is moderate to severe left ventricular systolic dysfunction.  LV end diastolic pressure is normal.  There is no aortic valve stenosis.  Medical therapy for CAD. Unsuccessful attempt at PCI of ostial circumflex. Restart IV heparin 8 hours post sheath pull. OK to start [place] tomorrow for atrial fibrillation. He was loaded with Plavix but will not continue if he is starting a DOAC. If he is not starting a DOAC, DAPT would be reasonable for his CAD.   Diagnostic Dominance: Right    Echo: 01/04/21  IMPRESSIONS    1. There is no left ventricular thrombus (Definity contrast was used).  There is apical dyskinesis. There is severe inferior wall hypokinesis and  inferoseptal akinesis.. Left ventricular ejection fraction, by estimation,  is 25 to 30%. The left ventricle  has severely decreased function. The left ventricle demonstrates regional  wall motion abnormalities (see scoring diagram/findings for description).  The left ventricular internal cavity size was moderately to severely  dilated. Left ventricular diastolic  parameters are consistent with Grade I diastolic dysfunction (impaired  relaxation).  2. Right ventricular systolic function is normal. The right ventricular  size is normal. There is normal pulmonary artery systolic pressure. The  estimated right ventricular systolic pressure is 16.1 mmHg.  3. Left atrial size was severely dilated.  4. The mitral valve is normal in structure. Trivial mitral valve  regurgitation.  5. The aortic valve is tricuspid. There is mild calcification of the  aortic valve. There is mild thickening of the aortic valve. Aortic valve  regurgitation is not visualized.  Mild aortic valve sclerosis is present,  with no evidence of aortic valve  stenosis.  6. Aortic dilatation noted. There is mild dilatation of the aortic root,  measuring 39 mm.  7. The inferior vena cava is normal in size with greater than 50%  respiratory variability, suggesting right atrial pressure of 3 mmHg.    EKG:  EKG is not ordered today.    Recent Labs: 06/20/2020: ALT 14 01/05/2021: BUN 17; Creatinine, Ser 0.83; Hemoglobin 15.8; Platelets 232; Potassium 4.0; Sodium 135  Recent Lipid Panel    Component Value Date/Time   CHOL 181 01/04/2021 0236   CHOL 187 03/18/2020 1522   TRIG 169 (H) 01/04/2021 0236   HDL 42 01/04/2021 0236   HDL 35 (L) 03/18/2020 1522   CHOLHDL 4.3 01/04/2021 0236   VLDL 34 01/04/2021 0236   LDLCALC 105 (H) 01/04/2021 0236   LDLCALC 118 (H) 03/18/2020 1522    Physical Exam:    VS:  BP 118/70   Pulse 78   Ht 6\' 1"  (1.854 m)   Wt 224 lb (101.6 kg)   SpO2 95%   BMI 29.55 kg/m     Wt Readings from Last 3 Encounters:  01/18/21 224 lb (101.6 kg)  01/03/21 215 lb (97.5 kg)  07/16/20 214 lb 12.8 oz (97.4 kg)     GEN: Well nourished, well developed in no acute distress HEENT: Normal NECK: No JVD; No carotid bruits LYMPHATICS: No lymphadenopathy CARDIAC: RRR, no murmurs, rubs, gallops RESPIRATORY:  Clear to auscultation without rales, wheezing or rhonchi  ABDOMEN: Soft, non-tender, non-distended MUSCULOSKELETAL:  No edema; No deformity  SKIN: Warm and dry NEUROLOGIC:  Alert and oriented x 3 PSYCHIATRIC:  Normal affect   ASSESSMENT:    1. Coronary artery disease involving native coronary artery of native heart with angina pectoris ([place])   2. Ischemic cardiomyopathy   3. ICD (implantable cardioverter-defibrillator) in place   4. Chronic systolic heart  failure ([place])   5. PAF (paroxysmal atrial fibrillation) ([place])   6. Chronic anticoagulation   7. Hyperlipidemia, unspecified hyperlipidemia type   8. S/P CABG x 2   9. Smoker   10.  Fatigue, unspecified type    PLAN:    In order of problems listed above:  Coronary artery disease involving native coronary artery of native heart with angina pectoris ([place]) - Plan: nicotine (NICODERM CQ - dosed in mg/24 hr) patch 14 mg, Comprehensive metabolic panel, CBC, Magnesium, TSH, Vitamin D (25 hydroxy)  Ischemic cardiomyopathy  ICD (implantable cardioverter-defibrillator) in place  Chronic systolic heart failure (H[facility])  PAF (paroxysmal atrial fibrillation) ([facility])  Chronic anticoagulation  Hyperlipidemia, unspecified hyperlipidemia type  S/P CABG x 2  Smoker  Fatigue, unspecified type   CAD s/p CABG x 2, multiple PCI CTO of RCA complicated by perforation, tamponade, and pericarditis Unsuccessful PCI of Cx - 01/04/21 - continue ASA - no intervention, so plavix was not continued - continue imdur - no further chest pain   Paroxysmal atrial fibrillation - recently identified on ICD - started on eliquis 5 mg BID This patients CHA2DS2-VASc Score and unadjusted Ischemic Stroke Rate (% per year) is equal to 3.2 % stroke rate/year from a score of 3 (CHF, CAD, HTN)   Fatigue and poor sleep - since starting eliquis and since his heart cath - will collect CBC, BMP, Mg, vit D, and TSH - I have recommended melatonin 3-6 mg   Chronic systolic and diastolic heart failure - echo with EF 30-35% - recommendations to transition to entresto - PA was submitted prior to discharge but this was denied - will continue lisinopril and lasix - I will message PharmD to see if there is a reason PA was denied for entresto    Hypertension - continue coreg, imdur, lisinopril, lasix, and spiro   Hyperlipidemia with LDL goal < 70 01/04/2021: Cholesterol 181; HDL 42; LDL Cholesterol 105; Triglycerides 169; VLDL 34 - continue statin - zetia was added at discharge - repeat lipids in about 6 weeks   Smoker - he is interested in cessation, does not want to do chantix again - I will  prescribe 14 mg patch - also provide 1 800 quit now  Resource     Medication Adjustments/Labs and Tests Ordered: Current medicines are reviewed at length with the patient today.  Concerns regarding medicines are outlined above.  Orders Placed This Encounter  Procedures  . Comprehensive metabolic panel  . CBC  . Magnesium  . TSH  . Vitamin D (25 hydroxy)   Meds ordered this encounter  Medications  . nicotine (NICODERM CQ - dosed in mg/24 hr) patch 14 mg    Signed, Ledora Bottcher, [place]  01/18/2021 3:11 PM    [facility]

## 2021-01-18 ENCOUNTER — Ambulatory Visit (INDEPENDENT_AMBULATORY_CARE_PROVIDER_SITE_OTHER): Payer: Medicaid Other | Admitting: Physician Assistant

## 2021-01-18 ENCOUNTER — Other Ambulatory Visit: Payer: Self-pay

## 2021-01-18 ENCOUNTER — Encounter: Payer: Self-pay | Admitting: Physician Assistant

## 2021-01-18 VITALS — BP 118/70 | HR 78 | Ht 73.0 in | Wt 224.0 lb

## 2021-01-18 DIAGNOSIS — Z951 Presence of aortocoronary bypass graft: Secondary | ICD-10-CM

## 2021-01-18 DIAGNOSIS — I5022 Chronic systolic (congestive) heart failure: Secondary | ICD-10-CM | POA: Diagnosis not present

## 2021-01-18 DIAGNOSIS — I25119 Atherosclerotic heart disease of native coronary artery with unspecified angina pectoris: Secondary | ICD-10-CM

## 2021-01-18 DIAGNOSIS — Z95810 Presence of automatic (implantable) cardiac defibrillator: Secondary | ICD-10-CM | POA: Diagnosis not present

## 2021-01-18 DIAGNOSIS — R5383 Other fatigue: Secondary | ICD-10-CM

## 2021-01-18 DIAGNOSIS — E785 Hyperlipidemia, unspecified: Secondary | ICD-10-CM

## 2021-01-18 DIAGNOSIS — I255 Ischemic cardiomyopathy: Secondary | ICD-10-CM

## 2021-01-18 DIAGNOSIS — I480 Paroxysmal atrial fibrillation: Secondary | ICD-10-CM

## 2021-01-18 DIAGNOSIS — F17200 Nicotine dependence, unspecified, uncomplicated: Secondary | ICD-10-CM

## 2021-01-18 DIAGNOSIS — Z7901 Long term (current) use of anticoagulants: Secondary | ICD-10-CM

## 2021-01-18 MED ORDER — NICOTINE 7 MG/24HR TD PT24: 14.0000 mg | MEDICATED_PATCH | Freq: Every day | TRANSDERMAL | Status: DC

## 2021-01-18 NOTE — Patient Instructions (Signed)
Medication Instructions:  Start Nicotine Patch 14 mg (1 patch placed on the skin Daily) *If you need a refill on your cardiac medications before your next appointment, please call your pharmacy*   Lab Work: CBC,CMP,Magnesium,TSH, Vitamin D If you have labs (blood work) drawn today and your tests are completely normal, you will receive your results only by: Marland Kitchen MyChart Message (if you have MyChart) OR . A paper copy in the mail If you have any lab test that is abnormal or we need to change your treatment, we will call you to review the results.   Testing/Procedures: None    Follow-Up: At [facility], you and your health needs are our priority.  As part of our continuing mission to provide you with exceptional heart care, we have created designated Provider Care Teams.  These Care Teams include your primary Cardiologist (physician) and Advanced Practice Providers (APPs -  Physician Assistants and Nurse Practitioners) who all work together to provide you with the care you need, when you need it.  Your next appointment:   2-3 weeks   The format for your next appointment:   In Person  Provider:   Peter Martinique, MD   Other Instructions 3-6 mg Melatonin At 8 PM for Sleep

## 2021-01-19 LAB — COMPREHENSIVE METABOLIC PANEL
ALT: 15 IU/L (ref 0–44)
AST: 15 IU/L (ref 0–40)
Albumin/Globulin Ratio: 1.4 (ref 1.2–2.2)
Albumin: 4.4 g/dL (ref 3.8–4.8)
Alkaline Phosphatase: 74 IU/L (ref 44–121)
BUN/Creatinine Ratio: 18 (ref 10–24)
BUN: 17 mg/dL (ref 8–27)
Bilirubin Total: 0.3 mg/dL (ref 0.0–1.2)
CO2: 23 mmol/L (ref 20–29)
Calcium: 9.9 mg/dL (ref 8.6–10.2)
Chloride: 100 mmol/L (ref 96–106)
Creatinine, Ser: 0.94 mg/dL (ref 0.76–1.27)
GFR calc Af Amer: 99 mL/min/{1.73_m2} (ref >59)
GFR calc non Af Amer: 85 mL/min/{1.73_m2} (ref >59)
Globulin, Total: 3.1 g/dL (ref 1.5–4.5)
Glucose: 73 mg/dL (ref 65–99)
Potassium: 5.2 mmol/L (ref 3.5–5.2)
Sodium: 139 mmol/L (ref 134–144)
Total Protein: 7.5 g/dL (ref 6.0–8.5)

## 2021-01-19 LAB — CBC
Hematocrit: 48.7 % (ref 37.5–51.0)
Hemoglobin: 16.8 g/dL (ref 13.0–17.7)
MCH: 30.3 pg (ref 26.6–33.0)
MCHC: 34.5 g/dL (ref 31.5–35.7)
MCV: 88 fL (ref 79–97)
Platelets: 318 10*3/uL (ref 150–450)
RBC: 5.54 x10E6/uL (ref 4.14–5.80)
RDW: 13.2 % (ref 11.6–15.4)
WBC: 10.8 10*3/uL (ref 3.4–10.8)

## 2021-01-19 LAB — MAGNESIUM: Magnesium: 1.4 mg/dL — ABNORMAL LOW (ref 1.6–2.3)

## 2021-01-19 LAB — TSH: TSH: 2.08 u[IU]/mL (ref 0.450–4.500)

## 2021-01-19 LAB — VITAMIN D 25 HYDROXY (VIT D DEFICIENCY, FRACTURES): Vit D, 25-Hydroxy: 12.9 ng/mL — ABNORMAL LOW (ref 30.0–100.0)

## 2021-01-19 NOTE — Progress Notes (Signed)
Remote ICD transmission.   

## 2021-01-21 ENCOUNTER — Other Ambulatory Visit: Payer: Self-pay | Admitting: *Deleted

## 2021-01-21 MED ORDER — MAGNESIUM OXIDE -MG SUPPLEMENT 200 MG PO TABS: 200.0000 mg | ORAL_TABLET | Freq: Two times a day (BID) | ORAL | 0 refills | Status: DC

## 2021-02-07 NOTE — Progress Notes (Deleted)
Cardiology Office Note:    Date:  02/07/2021   ID:  Kerry Mason, DOB 1956-05-22, MRN [id number]  PCP:  Pcp, No  Cardiologist:  Sheena Simonis Martinique, MD   Referring MD: No ref. provider found   No chief complaint on file. post cath [place]ib  History of Present Illness:    Kerry Mason is a 65 y.o. male with a hx of CAD s/p CABG x 2 and multiple PCI, chronic systolic heart failure s/p ICD 2015, HTN, and tobacco use. Remote stenting of RCA in 2000, then lost to follow up. Anterior STEMI Jan 2015 treated with DES to LAD. EF by cath was 35% and ICD was placed. Abnormal nuclear stress test 11/2014 led to heart cath and subsequent CTO of RCA. Procedure was complicated by perforation of mid RCA and cardiac tamponade that required emergent pericardiocentesis with removal of 1L of blood. Perforation sealed and covered with stent to the mid RCA; remainder of RCA wa sstented with 4 additional DES. Pt suffered pericarditis after this PCI and developed PAF. He had a NSTEMI 06/2015 with reocclusion of mid RCA, treated medically with imdur. Left main FFR was abnormal and he was referred to CT surgery. CABG x 2 08/2015 with LIMA-LAD and SVG-ramus. RCA not bypassed. He had recurrence of chest pain 08/2017 leading to heart cath that showed patent LIMA-LAD, SVG-ramus, and occluded RCA with L-R collaterals. There were multiple abnormal vessels arising from LIMA graft, mediastinal mass was a possibility. CT chest showed 4.1 cm AAA. He was  He follows with [facility] for abdominal aortic aneurysm 4.9 cm.   He presented to [facility] in February 2022 with chest pain. Symptoms somewhat atypical with flat HST. Given his known CAD, heart cath was repeated. This showed patent LIMA to the LAD and SVG to ramus. PCI attempted of ostial LCx, but was unsuccessful. RCA was chronically occluded. He was loaded with plavix in the cath lab, but this was not continued given his need for DOAC.   He presents today for cath follow up. He states he has lost  the majority of his energy. No syncope, but feels like he has no energy. He does not report symptoms consistent with orthostatic hypotension. He reports resolution of his palpitations. He denies active bleeding.   He also reports that he is unable to sleep more than 2 hrs nightly since his heart cath. This is likely contributing to his lack of energy, but may need a sleep study.    Past Medical History:  Diagnosis Date  . Acute systolic CHF (congestive heart failure) ([place])   . AICD (automatic cardioverter/defibrillator) present   . Anxiety   . Ascending aortic aneurysm ([place]) 08/31/2017  . Back pain 08/31/2017  . Basal cell carcinoma    left arm  . CAD (coronary artery disease)    a. 2000: s/p stent of RCA 2000 with BMS  b. 2015: STEMI s/p LHC with old occlusion of RCA and DESx2 to LAD  c. 06/09/40: complicated PCI on 3/2 for CTO of mid RCA with coronary perforation and cardiac tamponade requiring emergent pericardiocentesis     . Cardiac tamponade    a. 02/03/15 2/2 coronary perforation during CTO procedure. Sealed with graftmaster coated stent.   Marland Kitchen COPD (chronic obstructive pulmonary disease) ([place]) 08/31/2017  . Gout   . History of pneumonia   . HTN (hypertension)   . Hyperlipidemia   . Ischemic cardiomyopathy    a. 2D ECHO: EF 25-30%. Akinesis of the anteroseptal and  . Left  main coronary artery disease   . Myocardial infarction ([place])   . Obesity   . 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 Shortness of breath dyspnea   . Tobacco abuse   . Urinary frequency     Past Surgical History:  Procedure Laterality Date  . CARDIAC CATHETERIZATION    . CARDIAC CATHETERIZATION N/A 06/14/2015   Procedure: Left Heart Cath and Coronary Angiography;  Surgeon: Lorretta Harp, MD;  Location: [place] [facility];  Service: Cardiovascular;  Laterality: N/A;  . CARDIAC CATHETERIZATION N/A 08/18/2015   Procedure: Intravascular Pressure Wire/FFR  Study;  Surgeon: Tyreesha Maharaj M Martinique, MD;  Location: [place] [facility];  Service: Cardiovascular;  Laterality: N/A;  . COLONOSCOPY    . CORONARY ANGIOPLASTY  2000  . CORONARY ANGIOPLASTY WITH STENT PLACEMENT  2015  . CORONARY ARTERY BYPASS GRAFT N/A 09/02/2015   Procedure: CORONARY ARTERY BYPASS GRAFTING (CABG) x 2 (LIMA-LAD, SVG-Intermediate) ENDOSCOPIC GREATER SAPHENOUS VEIN HARVEST RIGHT THIGH;  Surgeon: Rexene Alberts, MD;  Location: [place];  Service: Open Heart Surgery;  Laterality: N/A;  . CORONARY BALLOON ANGIOPLASTY N/A 01/04/2021   Procedure: CORONARY BALLOON ANGIOPLASTY;  Surgeon: Jettie Booze, MD;  Location: [place] [facility];  Service: Cardiovascular;  Laterality: N/A;  . FOOT SURGERY    . IMPLANTABLE CARDIOVERTER DEFIBRILLATOR IMPLANT N/A 04/17/2014   Procedure: IMPLANTABLE CARDIOVERTER DEFIBRILLATOR IMPLANT;  Surgeon: Evans Lance, MD;  Location: [facility] CATH LAB;  Service: Cardiovascular;  Laterality: N/A;  . LEFT HEART CATH AND CORONARY ANGIOGRAPHY N/A 01/04/2021   Procedure: LEFT HEART CATH AND CORONARY ANGIOGRAPHY;  Surgeon: Jettie Booze, MD;  Location: [place] [facility];  Service: Cardiovascular;  Laterality: N/A;  . LEFT HEART CATH AND CORS/GRAFTS ANGIOGRAPHY N/A 08/30/2017   Procedure: LEFT HEART CATH AND CORS/GRAFTS ANGIOGRAPHY;  Surgeon: Nelva Bush, MD;  Location: [place] [facility];  Service: Cardiovascular;  Laterality: N/A;  . LEFT HEART CATHETERIZATION WITH CORONARY ANGIOGRAM N/A 12/11/2013   Procedure: LEFT HEART CATHETERIZATION WITH CORONARY ANGIOGRAM;  Surgeon: Maryfrances Portugal M Martinique, MD;  Location: [facility] CATH LAB;  Service: Cardiovascular;  Laterality: N/A;  . LEFT HEART CATHETERIZATION WITH CORONARY ANGIOGRAM N/A 01/05/2015   Procedure: LEFT HEART CATHETERIZATION WITH CORONARY ANGIOGRAM;  Surgeon: Vannie Hilgert M Martinique, MD;  Location: [facility] CATH LAB;  Service: Cardiovascular;  Laterality: N/A;  . PERCUTANEOUS CORONARY STENT INTERVENTION (PCI-S)  12/11/2013   Procedure: PERCUTANEOUS  CORONARY STENT INTERVENTION (PCI-S);  Surgeon: Noboru Bidinger M Martinique, MD;  Location: [facility] CATH LAB;  Service: Cardiovascular;;  prov LAD and mid LAD  . PERCUTANEOUS CORONARY STENT INTERVENTION (PCI-S) N/A 02/03/2015   Procedure: PERCUTANEOUS CORONARY STENT INTERVENTION (PCI-S);  Surgeon: Jettie Booze, MD;  Location: [facility] CATH LAB;  Service: Cardiovascular;  Laterality: N/A;  . TEE WITHOUT CARDIOVERSION N/A 09/02/2015   Procedure: TRANSESOPHAGEAL ECHOCARDIOGRAM (TEE);  Surgeon: Rexene Alberts, MD;  Location: [place];  Service: Open Heart Surgery;  Laterality: N/A;    Current Medications: No outpatient medications have been marked as taking for the 02/09/21 encounter (Appointment) with Martinique, Briann Sarchet M, MD.   Current Facility-Administered Medications for the 02/09/21 encounter (Appointment) with Martinique, Lukus Binion M, MD  Medication  . nicotine (NICODERM CQ - dosed in mg/24 hr) patch 14 mg     Allergies:   Patient has no known allergies.   Social History   Socioeconomic History  . Marital status: Married    Spouse name:  Not on file  . Number of children: 2  . Years of education: Not on file  . Highest education level: Not on file  Occupational History  . 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 Smoking status: Current Every Day Smoker    Packs/day: 0.50    Years: 35.00    Pack years: 17.50    Types: Cigarettes  . Smokeless tobacco: Never Used  Vaping Use  . Vaping Use: Never used  Substance and Sexual Activity  . Alcohol use: No    Alcohol/week: 0.0 standard drinks  . Drug use: No  . Sexual activity: Not Currently  Other Topics Concern  . Not on file  Social History Narrative  . Not on file   Social Determinants of Health   Financial Resource Strain: Not on file  Food Insecurity: Not on file  Transportation Needs: Not on file  Physical Activity: Not on file  Stress: Not on file  Social Connections: Not on file     Family History: The patient's family history includes  CAD in his father; Cancer in his mother.  ROS:   Please see the history of present illness.     All other systems reviewed and are negative.  EKGs/Labs/Other Studies Reviewed:    The following studies were reviewed today:  Cath: 01/04/21   LM lesion is 50% stenosed. Patent LIMA to LAD. Patent SVG to ramus.  2nd Diag lesion is 70% stenosed.  Mid RCA to Dist RCA lesion is 100% stenosed.  Prox RCA to Mid RCA lesion is 100% stenosed.  Ost RCA to Prox RCA lesion is 95% stenosed.  Ost Cx lesion is 80% stenosed. This territory is not bypassed, but it is a relatively small vessel. Unable to advance balloon after successfully guiding the wire though the ramus graft and retrograde to the ostial circumflex and across the lesion.  The left ventricular ejection fraction is 25-35% by visual estimate.  There is moderate to severe left ventricular systolic dysfunction.  LV end diastolic pressure is normal.  There is no aortic valve stenosis.  Medical therapy for CAD. Unsuccessful attempt at PCI of ostial circumflex. Restart IV heparin 8 hours post sheath pull. OK to start [place] tomorrow for atrial fibrillation. He was loaded with Plavix but will not continue if he is starting a DOAC. If he is not starting a DOAC, DAPT would be reasonable for his CAD.   Diagnostic Dominance: Right    Echo: 01/04/21  IMPRESSIONS    1. There is no left ventricular thrombus (Definity contrast was used).  There is apical dyskinesis. There is severe inferior wall hypokinesis and  inferoseptal akinesis.. Left ventricular ejection fraction, by estimation,  is 25 to 30%. The left ventricle  has severely decreased function. The left ventricle demonstrates regional  wall motion abnormalities (see scoring diagram/findings for description).  The left ventricular internal cavity size was moderately to severely  dilated. Left ventricular diastolic  parameters are consistent with Grade I diastolic  dysfunction (impaired  relaxation).  2. Right ventricular systolic function is normal. The right ventricular  size is normal. There is normal pulmonary artery systolic pressure. The  estimated right ventricular systolic pressure is 27.7 mmHg.  3. Left atrial size was severely dilated.  4. The mitral valve is normal in structure. Trivial mitral valve  regurgitation.  5. The aortic valve is tricuspid. There is mild calcification of the  aortic valve. There is mild thickening of the aortic valve. Aortic valve  regurgitation is  not visualized. Mild aortic valve sclerosis is present,  with no evidence of aortic valve  stenosis.  6. Aortic dilatation noted. There is mild dilatation of the aortic root,  measuring 39 mm.  7. The inferior vena cava is normal in size with greater than 50%  respiratory variability, suggesting right atrial pressure of 3 mmHg.    EKG:  EKG is not ordered today.    Recent Labs: 01/18/2021: ALT 15; BUN 17; Creatinine, Ser 0.94; Hemoglobin 16.8; Magnesium 1.4; Platelets 318; Potassium 5.2; Sodium 139; TSH 2.080  Recent Lipid Panel    Component Value Date/Time   CHOL 181 01/04/2021 0236   CHOL 187 03/18/2020 1522   TRIG 169 (H) 01/04/2021 0236   HDL 42 01/04/2021 0236   HDL 35 (L) 03/18/2020 1522   CHOLHDL 4.3 01/04/2021 0236   VLDL 34 01/04/2021 0236   LDLCALC 105 (H) 01/04/2021 0236   LDLCALC 118 (H) 03/18/2020 1522    Physical Exam:    VS:  There were no vitals taken for this visit.    Wt Readings from Last 3 Encounters:  01/18/21 224 lb (101.6 kg)  01/03/21 215 lb (97.5 kg)  07/16/20 214 lb 12.8 oz (97.4 kg)     GEN: Well nourished, well developed in no acute distress HEENT: Normal NECK: No JVD; No carotid bruits LYMPHATICS: No lymphadenopathy CARDIAC: RRR, no murmurs, rubs, gallops RESPIRATORY:  Clear to auscultation without rales, wheezing or rhonchi  ABDOMEN: Soft, non-tender, non-distended MUSCULOSKELETAL:  No edema; No deformity   SKIN: Warm and dry NEUROLOGIC:  Alert and oriented x 3 PSYCHIATRIC:  Normal affect   ASSESSMENT:    No diagnosis found. PLAN:    In order of problems listed above:  No diagnosis found.   1. CAD s/p CABG x 2, multiple PCI CTO of RCA complicated by perforation, tamponade, and pericarditis Unsuccessful PCI of Cx - 01/04/21 - continue ASA - no intervention, so plavix was not continued - continue imdur - no further chest pain   2. Paroxysmal atrial fibrillation - recently identified on ICD - started on eliquis 5 mg BID This patients CHA2DS2-VASc Score and unadjusted Ischemic Stroke Rate (% per year) is equal to 3.2 % stroke rate/year from a score of 3 (CHF, CAD, HTN)   3. Fatigue and poor sleep - since starting eliquis and since his heart cath - will collect CBC, BMP, Mg, vit D, and TSH - I have recommended melatonin 3-6 mg   4. Chronic systolic and diastolic heart failure - echo with EF 30-35% - recommendations to transition to entresto - PA was submitted prior to discharge but this was denied - will continue lisinopril and lasix - I will message PharmD to see if there is a reason PA was denied for entresto    5. Hypertension - continue coreg, imdur, lisinopril, lasix, and spiro   6. Hyperlipidemia with LDL goal < 70 01/04/2021: Cholesterol 181; HDL 42; LDL Cholesterol 105; Triglycerides 169; VLDL 34 - continue statin - zetia was added at discharge - repeat lipids in about 6 weeks   7. Smoker - he is interested in cessation, does not want to do chantix again - I will prescribe 14 mg patch - also provide 1 800 quit now  Resource     Medication Adjustments/Labs and Tests Ordered: Current medicines are reviewed at length with the patient today.  Concerns regarding medicines are outlined above.  No orders of the defined types were placed in this encounter.  No orders of  the defined types were placed in this encounter.   Signed, Jilda Kress Martinique, MD  02/07/2021  12:45 PM    [place]

## 2021-02-08 NOTE — Progress Notes (Signed)
Virtual Visit via Telephone Note   This visit type was conducted due to national recommendations for restrictions regarding the COVID-19 Pandemic (e.g. social distancing) in an effort to limit this patient's exposure and mitigate transmission in our community.  Due to his co-morbid illnesses, this patient is at least at moderate risk for complications without adequate follow up.  This format is felt to be most appropriate for this patient at this time.  The patient did not have access to video technology/had technical difficulties with video requiring transitioning to audio format only (telephone).  All issues noted in this document were discussed and addressed.  No physical exam could be performed with this format.  Please refer to the patient's chart for his  consent to telehealth for [facility].    Date:  02/09/2021   ID:  Kerry Mason, DOB 1956-11-17, MRN [id number] The patient was identified using 2 identifiers.  Patient Location: Home Provider Location: Home Office   PCP:  Pcp, No   [place]  Cardiologist:  Yanique Mulvihill Martinique, MD  Advanced Practice Provider:  No care team member to display Electrophysiologist:  None      Evaluation Performed:  Follow-Up Visit  Chief Complaint:  CAD  History of Present Illness:    Kerry Mason is a 65 y.o. male with a hx of CAD s/p CABG x 2 and multiple PCI, chronic systolic heart failure s/p ICD 2015, HTN, and tobacco use. Remote stenting of RCA in 2000, then lost to follow up. Anterior STEMI Jan 2015 treated with DES to LAD. EF by cath was 35% and ICD was placed. Abnormal nuclear stress test 11/2014 led to heart cath and subsequent CTO of RCA. Procedure was complicated by perforation of mid RCA and cardiac tamponade that required emergent pericardiocentesis with removal of 1L of blood. Perforation sealed and covered with stent to the mid RCA; remainder of RCA wa sstented with 4 additional DES. Pt suffered pericarditis  after this PCI and developed PAF. He had a NSTEMI 06/2015 with reocclusion of mid RCA, treated medically with imdur. Left main FFR was abnormal and he was referred to CT surgery. CABG x 2 08/2015 with LIMA-LAD and SVG-ramus. RCA not bypassed. He had recurrence of chest pain 08/2017 leading to heart cath that showed patent LIMA-LAD, SVG-ramus, and occluded RCA with L-R collaterals. There were multiple abnormal vessels arising from LIMA graft, mediastinal mass was a possibility. CT chest showed 4.1 cm AAA. He was last seen by Dr. 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 He follows with [facility] for abdominal aortic aneurysm 4.9 cm.   He presented to [facility] in February with chest pain. Symptoms somewhat atypical with flat HST. Given his known CAD, heart cath was repeated. PCI attempted of ostial LCx, but was unsuccessful.  He was loaded with plavix in the cath lab, but this was not continued given his need for DOAC.  On follow up today he reports no further chest pain. No dyspnea. Still has significant fatigue. On follow up lab work he was noted to have low magnesium level of 1.4 and low Vit D. He has started OTC magnesium 400 mg daily and Vit D 2000 units daily. He is now on Zetia for hypercholesterolemia. He was denied PA for [facility] prior to DC for unclear reasons. He has not stopped smoking but wants to try Chantix again.   The patient does not have symptoms concerning for COVID-19 infection (fever, chills, cough, or new shortness of breath).  Past Medical History:  Diagnosis Date  . Acute systolic CHF (congestive heart failure) ([place])   . AICD (automatic cardioverter/defibrillator) present   . Anxiety   . Ascending aortic aneurysm ([place]) 08/31/2017  . Back pain 08/31/2017  . Basal cell carcinoma    left arm  . CAD (coronary artery disease)    a. 2000: s/p stent of RCA 2000 with BMS  b. 2015: STEMI s/p LHC with old occlusion of RCA and DESx2 to LAD  c. 06/03/05: complicated PCI on 3/2 for  CTO of mid RCA with coronary perforation and cardiac tamponade requiring emergent pericardiocentesis     . Cardiac tamponade    a. 02/03/15 2/2 coronary perforation during CTO procedure. Sealed with graftmaster coated stent.   Marland Kitchen COPD (chronic obstructive pulmonary disease) ([place]) 08/31/2017  . Gout   . History of pneumonia   . HTN (hypertension)   . Hyperlipidemia   . Ischemic cardiomyopathy    a. 2D ECHO: EF 25-30%. Akinesis of the anteroseptal and  . Left main coronary artery disease   . Myocardial infarction ([place])   . Obesity   . 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 Shortness of breath dyspnea   . Tobacco abuse   . Urinary frequency    Past Surgical History:  Procedure Laterality Date  . CARDIAC CATHETERIZATION    . CARDIAC CATHETERIZATION N/A 06/14/2015   Procedure: Left Heart Cath and Coronary Angiography;  Surgeon: Lorretta Harp, MD;  Location: [place] CV LAB;  Service: Cardiovascular;  Laterality: N/A;  . CARDIAC CATHETERIZATION N/A 08/18/2015   Procedure: Intravascular Pressure Wire/FFR Study;  Surgeon: Aamira Bischoff M Martinique, MD;  Location: [place] CV LAB;  Service: Cardiovascular;  Laterality: N/A;  . COLONOSCOPY    . CORONARY ANGIOPLASTY  2000  . CORONARY ANGIOPLASTY WITH STENT PLACEMENT  2015  . CORONARY ARTERY BYPASS GRAFT N/A 09/02/2015   Procedure: CORONARY ARTERY BYPASS GRAFTING (CABG) x 2 (LIMA-LAD, SVG-Intermediate) ENDOSCOPIC GREATER SAPHENOUS VEIN HARVEST RIGHT THIGH;  Surgeon: Rexene Alberts, MD;  Location: [place];  Service: Open Heart Surgery;  Laterality: N/A;  . CORONARY BALLOON ANGIOPLASTY N/A 01/04/2021   Procedure: CORONARY BALLOON ANGIOPLASTY;  Surgeon: Jettie Booze, MD;  Location: [place] CV LAB;  Service: Cardiovascular;  Laterality: N/A;  . FOOT SURGERY    . IMPLANTABLE CARDIOVERTER DEFIBRILLATOR IMPLANT N/A 04/17/2014   Procedure: IMPLANTABLE CARDIOVERTER DEFIBRILLATOR IMPLANT;  Surgeon:  Evans Lance, MD;  Location: [facility] CATH LAB;  Service: Cardiovascular;  Laterality: N/A;  . LEFT HEART CATH AND CORONARY ANGIOGRAPHY N/A 01/04/2021   Procedure: LEFT HEART CATH AND CORONARY ANGIOGRAPHY;  Surgeon: Jettie Booze, MD;  Location: [place] CV LAB;  Service: Cardiovascular;  Laterality: N/A;  . LEFT HEART CATH AND CORS/GRAFTS ANGIOGRAPHY N/A 08/30/2017   Procedure: LEFT HEART CATH AND CORS/GRAFTS ANGIOGRAPHY;  Surgeon: Nelva Bush, MD;  Location: [place] CV LAB;  Service: Cardiovascular;  Laterality: N/A;  . LEFT HEART CATHETERIZATION WITH CORONARY ANGIOGRAM N/A 12/11/2013   Procedure: LEFT HEART CATHETERIZATION WITH CORONARY ANGIOGRAM;  Surgeon: Keonna Raether M Martinique, MD;  Location: [facility] CATH LAB;  Service: Cardiovascular;  Laterality: N/A;  . LEFT HEART CATHETERIZATION WITH CORONARY ANGIOGRAM N/A 01/05/2015   Procedure: LEFT HEART CATHETERIZATION WITH CORONARY ANGIOGRAM;  Surgeon: Jilleen Essner M Martinique, MD;  Location: [facility] CATH LAB;  Service: Cardiovascular;  Laterality: N/A;  . PERCUTANEOUS CORONARY STENT INTERVENTION (PCI-S)  12/11/2013  Procedure: PERCUTANEOUS CORONARY STENT INTERVENTION (PCI-S);  Surgeon: Teaghan Formica M Martinique, MD;  Location[facility] CATH LAB;  Service: Cardiovascular;;  prov LAD and mid LAD  . PERCUTANEOUS CORONARY STENT INTERVENTION (PCI-S) N/A 02/03/2015   Procedure: PERCUTANEOUS CORONARY STENT INTERVENTION (PCI-S);  Surgeon: Jettie Booze, MD;  Location: [facility] CATH LAB;  Service: Cardiovascular;  Laterality: N/A;  . TEE WITHOUT CARDIOVERSION N/A 09/02/2015   Procedure: TRANSESOPHAGEAL ECHOCARDIOGRAM (TEE);  Surgeon: Rexene Alberts, MD;  Location: [place];  Service: Open Heart Surgery;  Laterality: N/A;     Current Meds  Medication Sig  . acetaminophen (TYLENOL) 325 MG tablet Take 2 tablets (650 mg total) by mouth every 4 (four) hours as needed for headache or mild pain.  Marland Kitchen allopurinol (ZYLOPRIM) 300 MG tablet Take 1 tablet (300 mg total) by mouth daily.  Marland Kitchen apixaban (ELIQUIS) 5  MG TABS tablet Take 1 tablet (5 mg total) by mouth 2 (two) times daily.  Marland Kitchen aspirin EC 81 MG tablet Take 81 mg by mouth daily.  Marland Kitchen atorvastatin (LIPITOR) 80 MG tablet Take 1 tablet (80 mg total) by mouth daily.  . carvedilol (COREG) 6.25 MG tablet Take 1 tablet (6.25 mg total) by mouth 2 (two) times daily.  . cholecalciferol (VITAMIN D3) 25 MCG (1000 UNIT) tablet Take 1,000 Units by mouth daily.  Marland Kitchen ezetimibe (ZETIA) 10 MG tablet Take 1 tablet (10 mg total) by mouth daily.  . furosemide (LASIX) 40 MG tablet Take 1 tablet (40 mg total) by mouth daily.  . isosorbide mononitrate (IMDUR) 30 MG 24 hr tablet Take 1 tablet (30 mg total) by mouth daily.  Marland Kitchen lisinopril (ZESTRIL) 5 MG tablet Take 1 tablet (5 mg total) by mouth daily.  . Magnesium Oxide 200 MG TABS Take 1 tablet (200 mg total) by mouth 2 (two) times daily.  . nitroGLYCERIN (NITROSTAT) 0.4 MG SL tablet DISSOLVE ONE TABLET UNDER THE TONGUE EVERY 5 MINUTES AS NEEDED FOR CHEST PAIN.  DO NOT EXCEED A TOTAL OF 3 DOSES IN 15 MINUTES  . spironolactone (ALDACTONE) 25 MG tablet Take 25 mg by mouth daily. 1Tablet Daily  . [DISCONTINUED] sildenafil (VIAGRA) 50 MG tablet Take 1 tablet (50 mg total) by mouth daily as needed for erectile dysfunction.   Current Facility-Administered Medications for the 02/09/21 encounter (Video Visit) with Martinique, Garrus Gauthreaux M, MD  Medication  . nicotine (NICODERM CQ - dosed in mg/24 hr) patch 14 mg     Allergies:   Patient has no known allergies.   Social History   Tobacco Use  . Smoking status: Current Every Day Smoker    Packs/day: 0.50    Years: 35.00    Pack years: 17.50    Types: Cigarettes  . Smokeless tobacco: Never Used  Vaping Use  . Vaping Use: Never used  Substance Use Topics  . Alcohol use: No    Alcohol/week: 0.0 standard drinks  . Drug use: No     Family Hx: The patient's family history includes CAD in his father; Cancer in his mother.  ROS:   Please see the history of present illness.    All  other systems reviewed and are negative.   Prior CV studies:   The following studies were reviewed today:  Cath: 01/04/21   LM lesion is 50% stenosed. Patent LIMA to LAD. Patent SVG to ramus.  2nd Diag lesion is 70% stenosed.  Mid RCA to Dist RCA lesion is 100% stenosed.  Prox RCA to Mid RCA lesion is 100% stenosed.  Ost RCA to Prox RCA lesion is 95% stenosed.  Ost Cx lesion is 80% stenosed. This territory is not bypassed, but it is a relatively small vessel. Unable to advance balloon after successfully guiding the wire though the ramus graft and retrograde to the ostial circumflex and across the lesion.  The left ventricular ejection fraction is 25-35% by visual estimate.  There is moderate to severe left ventricular systolic dysfunction.  LV end diastolic pressure is normal.  There is no aortic valve stenosis.  Medical therapy for CAD. Unsuccessful attempt at PCI of ostial circumflex. Restart IV heparin 8 hours post sheath pull. OK to start [place] tomorrow for atrial fibrillation. He was loaded with Plavix but will not continue if he is starting a DOAC. If he is not starting a DOAC, DAPT would be reasonable for his CAD.   Diagnostic Dominance: Right    Echo: 01/04/21  IMPRESSIONS    1. There is no left ventricular thrombus (Definity contrast was used).  There is apical dyskinesis. There is severe inferior wall hypokinesis and  inferoseptal akinesis.. Left ventricular ejection fraction, by estimation,  is 25 to 30%. The left ventricle  has severely decreased function. The left ventricle demonstrates regional  wall motion abnormalities (see scoring diagram/findings for description).  The left ventricular internal cavity size was moderately to severely  dilated. Left ventricular diastolic  parameters are consistent with Grade I diastolic dysfunction (impaired  relaxation).  2. Right ventricular systolic function is normal. The right ventricular  size is  normal. There is normal pulmonary artery systolic pressure. The  estimated right ventricular systolic pressure is 98.3 mmHg.  3. Left atrial size was severely dilated.  4. The mitral valve is normal in structure. Trivial mitral valve  regurgitation.  5. The aortic valve is tricuspid. There is mild calcification of the  aortic valve. There is mild thickening of the aortic valve. Aortic valve  regurgitation is not visualized. Mild aortic valve sclerosis is present,  with no evidence of aortic valve  stenosis.  6. Aortic dilatation noted. There is mild dilatation of the aortic root,  measuring 39 mm.  7. The inferior vena cava is normal in size with greater than 50%  respiratory variability, suggesting right atrial pressure of 3 mmHg.    Labs/Other Tests and Data Reviewed:    EKG:  No ECG reviewed.  Recent Labs: 01/18/2021: ALT 15; BUN 17; Creatinine, Ser 0.94; Hemoglobin 16.8; Magnesium 1.4; Platelets 318; Potassium 5.2; Sodium 139; TSH 2.080   Recent Lipid Panel Lab Results  Component Value Date/Time   CHOL 181 01/04/2021 02:36 AM   CHOL 187 03/18/2020 03:22 PM   TRIG 169 (H) 01/04/2021 02:36 AM   HDL 42 01/04/2021 02:36 AM   HDL 35 (L) 03/18/2020 03:22 PM   CHOLHDL 4.3 01/04/2021 02:36 AM   LDLCALC 105 (H) 01/04/2021 02:36 AM   LDLCALC 118 (H) 03/18/2020 03:22 PM    Wt Readings from Last 3 Encounters:  02/09/21 213 lb (96.6 kg)  01/18/21 224 lb (101.6 kg)  01/03/21 215 lb (97.5 kg)     Risk Assessment/Calculations:      Objective:    Vital Signs:  BP (!) 157/97   Pulse 69   Ht 6\' 1"  (1.854 m)   Wt 213 lb (96.6 kg)   BMI 28.10 kg/m    VITAL SIGNS:  reviewed  ASSESSMENT & PLAN:    1. CAD s/p CABG x 2, multiple PCI. Patent LIMA to the LAD, SVG to ramus CTO of  RCA complicated by perforation, tamponade, and pericarditis Unsuccessful PCI of Cx - 01/04/21- I personally reviewed films. The angulation into the LCx is not favorable for PCI and it looks like the  LCx is adequately supplied by the SVG to ramus - he has no further chest pain - continue ASA - continue imdur   2. Paroxysmal atrial fibrillation - recently identified on ICD - started on eliquis 5 mg BID This patients CHA2DS2-VASc Score and unadjusted Ischemic Stroke Rate (% per year) is equal to 3.2 % stroke rate/year from a score of 3 (CHF, CAD, HTN)   3. Fatigue and poor sleep - will repeat lab in a couple of weeks to check Mg and Vit D levels again.    4. Chronic systolic and diastolic heart failure - echo with EF 30-35% - recommendations to transition to entresto - PA was submitted prior to discharge but this was denied- unclear why. - will continue lisinopril and lasix - I will message PharmD to see if there is a reason PA was denied for entresto - may need to resubmit. If we can get approved he will need to stop ACEi 3 days prior to starting.    5. Hypertension - continue coreg, imdur, lisinopril, lasix, and spiro   6. Hyperlipidemia with LDL goal < 70 01/04/2021: Cholesterol 181; HDL 42; LDL Cholesterol 105; Triglycerides 169; VLDL 34 - continue statin - zetia was added at discharge - repeat lipids in 2 weeks. If still not at goal will refer to lipid clinic   7. Smoker - he is interested in cessation - willing to try Chantix again.       COVID-19 Education: The signs and symptoms of COVID-19 were discussed with the patient and how to seek care for testing (follow up with PCP or arrange E-visit).  The importance of social distancing was discussed today.  Time:   Today, I have spent 20 minutes with the patient with telehealth technology discussing the above problems.     Medication Adjustments/Labs and Tests Ordered: Current medicines are reviewed at length with the patient today.  Concerns regarding medicines are outlined above.   Tests Ordered: No orders of the defined types were placed in this encounter.   Medication Changes: No orders of the  defined types were placed in this encounter.   Follow Up:  In Person in 3 month(s)  Signed, Reola Buckles Martinique, MD  02/09/2021 8:59 AM    [facility]

## 2021-02-09 ENCOUNTER — Ambulatory Visit: Payer: Medicaid Other | Admitting: Cardiology

## 2021-02-09 ENCOUNTER — Telehealth: Payer: Self-pay | Admitting: Cardiology

## 2021-02-09 ENCOUNTER — Telehealth: Payer: Self-pay

## 2021-02-09 ENCOUNTER — Telehealth (INDEPENDENT_AMBULATORY_CARE_PROVIDER_SITE_OTHER): Payer: Medicaid Other | Admitting: Cardiology

## 2021-02-09 ENCOUNTER — Encounter: Payer: Self-pay | Admitting: Cardiology

## 2021-02-09 VITALS — BP 157/97 | HR 69 | Ht 73.0 in | Wt 213.0 lb

## 2021-02-09 DIAGNOSIS — E785 Hyperlipidemia, unspecified: Secondary | ICD-10-CM

## 2021-02-09 DIAGNOSIS — Z95810 Presence of automatic (implantable) cardiac defibrillator: Secondary | ICD-10-CM | POA: Diagnosis not present

## 2021-02-09 DIAGNOSIS — I25119 Atherosclerotic heart disease of native coronary artery with unspecified angina pectoris: Secondary | ICD-10-CM | POA: Diagnosis not present

## 2021-02-09 DIAGNOSIS — I5022 Chronic systolic (congestive) heart failure: Secondary | ICD-10-CM

## 2021-02-09 DIAGNOSIS — Z951 Presence of aortocoronary bypass graft: Secondary | ICD-10-CM

## 2021-02-09 DIAGNOSIS — F17200 Nicotine dependence, unspecified, uncomplicated: Secondary | ICD-10-CM

## 2021-02-09 DIAGNOSIS — I255 Ischemic cardiomyopathy: Secondary | ICD-10-CM

## 2021-02-09 MED ORDER — CHANTIX STARTING MONTH PAK 0.5 MG X 11 & 1 MG X 42 PO TABS: ORAL_TABLET | ORAL | 0 refills | Status: DC

## 2021-02-09 MED ORDER — VARENICLINE TARTRATE 1 MG PO TABS: 1.0000 mg | ORAL_TABLET | Freq: Two times a day (BID) | ORAL | 1 refills | Status: DC

## 2021-02-09 NOTE — Telephone Encounter (Signed)
Add on for tomorrow 03/10 with PharmD at 3:00pm

## 2021-02-09 NOTE — Patient Instructions (Addendum)
We will check with our pharm D about prior approval for Entresto. Scheduler will call back with Pharmacist appointment   We will try Chantix again for smoking cessation  We will arrange follow up lab work in 2 weeks to check magnesium, Vit D and lipids.  Lab order enclosed   Follow up in office in 3 months.   Monday 05/30/21  At 10:20 am

## 2021-02-09 NOTE — Addendum Note (Signed)
Addended by: Kathyrn Lass on: 02/09/2021 11:26 AM   Modules accepted: Orders

## 2021-02-09 NOTE — Telephone Encounter (Signed)
Opened in error

## 2021-02-09 NOTE — Telephone Encounter (Signed)
  Patient Consent for Virtual Visit         Kerry Mason has provided verbal consent on 02/09/2021 for a virtual visit (video or telephone).   CONSENT FOR VIRTUAL VISIT FOR:  Kerry Mason  By participating in this virtual visit I agree to the following:  I hereby voluntarily request, consent and authorize [facility] and its employed or contracted physicians, Engineer, materials, nurse practitioners or other licensed health care professionals (the Practitioner), to provide me with telemedicine health care services (the "Services") as deemed necessary by the treating Practitioner. I acknowledge and consent to receive the Services by the Practitioner via telemedicine. I understand that the telemedicine visit will involve communicating with the Practitioner through live audiovisual communication technology and the disclosure of certain medical information by electronic transmission. I acknowledge that I have been given the opportunity to request an in-person assessment or other available alternative prior to the telemedicine visit and am voluntarily participating in the telemedicine visit.  I understand that I have the right to withhold or withdraw my consent to the use of telemedicine in the course of my care at any time, without affecting my right to future care or treatment, and that the Practitioner or I may terminate the telemedicine visit at any time. I understand that I have the right to inspect all information obtained and/or recorded in the course of the telemedicine visit and may receive copies of available information for a reasonable fee.  I understand that some of the potential risks of receiving the Services via telemedicine include:  Marland Kitchen Delay or interruption in medical evaluation due to technological equipment failure or disruption; . Information transmitted may not be sufficient (e.g. poor resolution of images) to allow for appropriate medical decision making by the Practitioner;  and/or  . In rare instances, security protocols could fail, causing a breach of personal health information.  Furthermore, I acknowledge that it is my responsibility to provide information about my medical history, conditions and care that is complete and accurate to the best of my ability. I acknowledge that Practitioner's advice, recommendations, and/or decision may be based on factors not within their control, such as incomplete or inaccurate data provided by me or distortions of diagnostic images or specimens that may result from electronic transmissions. I understand that the practice of medicine is not an exact science and that Practitioner makes no warranties or guarantees regarding treatment outcomes. I acknowledge that a copy of this consent can be made available to me via my patient portal ([place]), or I can request a printed copy by calling the office of [place].    I understand that my insurance will be billed for this visit.   I have read or had this consent read to me. . I understand the contents of this consent, which adequately explains the benefits and risks of the Services being provided via telemedicine.  . I have been provided ample opportunity to ask questions regarding this consent and the Services and have had my questions answered to my satisfaction. . I give my informed consent for the services to be provided through the use of telemedicine in my medical care

## 2021-02-10 ENCOUNTER — Other Ambulatory Visit: Payer: Self-pay

## 2021-02-10 ENCOUNTER — Ambulatory Visit (INDEPENDENT_AMBULATORY_CARE_PROVIDER_SITE_OTHER): Payer: Medicaid Other | Admitting: Pharmacist Clinician (PhC)/ Clinical Pharmacy Specialist

## 2021-02-10 DIAGNOSIS — I5021 Acute systolic (congestive) heart failure: Secondary | ICD-10-CM | POA: Diagnosis not present

## 2021-02-10 MED ORDER — ENTRESTO 24-26 MG PO TABS: 1.0000 | ORAL_TABLET | Freq: Two times a day (BID) | ORAL | 5 refills | Status: DC

## 2021-02-10 NOTE — Patient Instructions (Signed)
Once you get insurance (Part D- Medicare) figured out, then please give [place] a call.  Kristin/Raquel at [phone]  Check your blood pressure at home daily and keep record of the readings.  Take your BP meds as follows:  STOP TAKING LISINOPRIL.  START ENTRESTO 24/26 MG TWICE DAILY WITH THE FIRST DOSE ON Friday EVENING (MARCH 11)  Bring all of your meds, your BP cuff and your record of home blood pressures to your next appointment.  Exercise as you're able, try to walk approximately 30 minutes per day.  Keep salt intake to a minimum, especially watch canned and prepared boxed foods.  Eat more fresh fruits and vegetables and fewer canned items.  Avoid eating in fast food restaurants.    HOW TO TAKE YOUR BLOOD PRESSURE: . Rest 5 minutes before taking your blood pressure. .  Don't smoke or drink caffeinated beverages for at least 30 minutes before. . Take your blood pressure before (not after) you eat. . Sit comfortably with your back supported and both feet on the floor (don't cross your legs). . Elevate your arm to heart level on a table or a desk. . Use the proper sized cuff. It should fit smoothly and snugly around your bare upper arm. There should be enough room to slip a fingertip under the cuff. The bottom edge of the cuff should be 1 inch above the crease of the elbow. . Ideally, take 3 measurements at one sitting and record the average.

## 2021-02-10 NOTE — Assessment & Plan Note (Addendum)
Patient with HFrEF currently on lisinopril, spironolactone and carvedilol.  Will have him discontinue lisinopril and start with first dose of Entresto 24/26 tomorrow evening.  He will be going onto Medicare as of April 1, but as of yet does not have a supplement or part D Plan.  Had him meet with Westley Hummer LSW to get information on SHIIP to help with medicare supplements.  Gave 2 week sample of Entresto, as well as 30 day free card, thus giving [place] about 6 weeks to determine his new insurance.   Once he has a new Medicare D plan, he will call the office so we can determine co-pay affordability.    Asked that patient check home BP readings once daily and gave instructions for proper technique.  If his home BP readings drop too low, he knows to call the office, we may need to decrease spironolactone dose.  Also reviewed information about starting SGLT-2 inhibitors, but will hold off for now, until he determines insurance.

## 2021-02-10 NOTE — Progress Notes (Signed)
02/10/2021 [place] Aug 04, 1956 [id number]   HPI:  Kerry Mason is a 65 y.o. male patient of Dr Martinique, with a PMH below who presents today for heart failure medication titration.  Mr  Bordas was hospitalized in January for unstable angina at which time a cath showed blockage of the mid to distal RCA as well as the proximal to mid RCA. Attempt was made to treat an 80% ost circumflex lesion, however the PCI was unsuccessful.  An echocardiogram at that hospitalization showed EF to be 25-30%.  [name] was prescribed, however a prior authorization to his insurance company was denied, for reasons unknown to Kerry Mason.  He is in the office today to determine if we are able to clarify reasoning and get approval for [facility].    Patient is in the office to discuss Entresto.  He will be 65 next month, and thus will start Medicare as of April 1 (3 weeks from now).  He is feeling well overall, although admits to tiring easily with much activity.  He isn't sure why his insurance denied Entresto earlier, as he notes that he doesn't have a copay for most of his current medications.    Past Medical History: ASCVD Stent to RCA 2000, anterior STEMI 12/2013 with DES to LAD; 11/2014 had CTO of RCA (stent x 5), NSTEMI 7.2016, CABG x2 08/2015   hypertension Controlled with HF medications  hyperlipidemia LDL 105 - on atorvastatin 80 mg   PAF CHADS2-VASc score 3 (chf, htn, cad)     Blood Pressure Goal:  130/80  Current Medications: lisinopril 5 mg qd, carvedilol 6.25 mg bid, spironolactone 25 mg qd  Family Hx:  Father died at 88 - Parkinson's, mother cancer at 25; 1 brother w/o cardiac issues; children healthy  Social Hx: current smoker, started back after last MI - currently about 1/2 ppd just recently prescribed chantix. No alochol; 1 cup coffee daily  Diet: mostly home cooked meals, lives alone; lots of leftovers, no added salt, no fried foods  Exercise: not much activity, admits to tiring easily,  although does get about 10,000 steps per day  Home BP readings: none  Intolerances: nkda  Labs: 2/22; Na 139, K 5.2, Glu 73, BUN 17, SCr 0.94, GFR 85   Wt Readings from Last 3 Encounters:  02/10/21 227 lb 8 oz (103.2 kg)  02/09/21 213 lb (96.6 kg)  01/18/21 224 lb (101.6 kg)   BP Readings from Last 3 Encounters:  02/10/21 104/62  02/09/21 (!) 157/97  01/18/21 118/70   Pulse Readings from Last 3 Encounters:  02/10/21 76  02/09/21 69  01/18/21 78    Current Outpatient Medications  Medication Sig Dispense Refill  . acetaminophen (TYLENOL) 325 MG tablet Take 2 tablets (650 mg total) by mouth every 4 (four) hours as needed for headache or mild pain.    Marland Kitchen allopurinol (ZYLOPRIM) 300 MG tablet Take 1 tablet (300 mg total) by mouth daily. 90 tablet 3  . apixaban (ELIQUIS) 5 MG TABS tablet Take 1 tablet (5 mg total) by mouth 2 (two) times daily. 180 tablet 0  . aspirin EC 81 MG tablet Take 81 mg by mouth daily.    Marland Kitchen atorvastatin (LIPITOR) 80 MG tablet Take 1 tablet (80 mg total) by mouth daily. 90 tablet 1  . carvedilol (COREG) 6.25 MG tablet Take 1 tablet (6.25 mg total) by mouth 2 (two) times daily. 180 tablet 3  . cholecalciferol (VITAMIN D3) 25 MCG (1000 UNIT) tablet Take 1,000 Units  by mouth daily.    Marland Kitchen ezetimibe (ZETIA) 10 MG tablet Take 1 tablet (10 mg total) by mouth daily. 90 tablet 0  . furosemide (LASIX) 40 MG tablet Take 1 tablet (40 mg total) by mouth daily. 90 tablet 1  . isosorbide mononitrate (IMDUR) 30 MG 24 hr tablet Take 1 tablet (30 mg total) by mouth daily. 90 tablet 1  . Magnesium Oxide 200 MG TABS Take 1 tablet (200 mg total) by mouth 2 (two) times daily. 60 tablet 0  . nitroGLYCERIN (NITROSTAT) 0.4 MG SL tablet DISSOLVE ONE TABLET UNDER THE TONGUE EVERY 5 MINUTES AS NEEDED FOR CHEST PAIN.  DO NOT EXCEED A TOTAL OF 3 DOSES IN 15 MINUTES 25 tablet 2  . sacubitril-valsartan (ENTRESTO) 24-26 MG Take 1 tablet by mouth 2 (two) times daily. 60 tablet 5  . spironolactone  (ALDACTONE) 25 MG tablet Take 25 mg by mouth daily. 1Tablet Daily    . varenicline (CHANTIX CONTINUING MONTH PAK) 1 MG tablet Take 1 tablet (1 mg total) by mouth 2 (two) times daily. 60 tablet 1  . varenicline (CHANTIX STARTING MONTH PAK) 0.5 MG X 11 & 1 MG X 42 tablet Take one 0.5 mg tablet by mouth once daily for 3 days, then increase to one 0.5 mg tablet twice daily for 4 days, then increase to one 1 mg tablet twice daily. 53 tablet 0   Current Facility-Administered Medications  Medication Dose Route Frequency Provider Last Rate Last Admin  . nicotine (NICODERM CQ - dosed in mg/24 hr) patch 14 mg  14 mg Transdermal Daily Duke, Tami Lin, PA        No Known Allergies  Past Medical History:  Diagnosis Date  . Acute systolic CHF (congestive heart failure) ([place])   . AICD (automatic cardioverter/defibrillator) present   . Anxiety   . Ascending aortic aneurysm ([place]) 08/31/2017  . Back pain 08/31/2017  . Basal cell carcinoma    left arm  . CAD (coronary artery disease)    a. 2000: s/p stent of RCA 2000 with BMS  b. 2015: STEMI s/p LHC with old occlusion of RCA and DESx2 to LAD  c. 06/04/52: complicated PCI on 3/2 for CTO of mid RCA with coronary perforation and cardiac tamponade requiring emergent pericardiocentesis     . Cardiac tamponade    a. 02/03/15 2/2 coronary perforation during CTO procedure. Sealed with graftmaster coated stent.   Marland Kitchen COPD (chronic obstructive pulmonary disease) ([place]) 08/31/2017  . Gout   . History of pneumonia   . HTN (hypertension)   . Hyperlipidemia   . Ischemic cardiomyopathy    a. 2D ECHO: EF 25-30%. Akinesis of the anteroseptal and  . Left main coronary artery disease   . Myocardial infarction ([place])   . Obesity   . 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 Shortness of breath dyspnea   . Tobacco abuse   . Urinary frequency     Blood pressure 104/62, pulse 76, resp. rate 15, height 6\' 1"  (1.854 m),  weight 227 lb 8 oz (103.2 kg), SpO2 94 %.  Acute systolic CHF (congestive heart failure) ([place]) Patient with HFrEF currently on lisinopril, spironolactone and carvedilol.  Will have him discontinue lisinopril and start with first dose of Entresto 24/26 tomorrow evening.  He will be going onto Medicare as of April 1, but as of yet does not have a supplement or part D Plan.  Had him meet with Westley Hummer LSW to get information on SHIIP to help with medicare supplements.  Gave 2 week sample of Entresto, as well as 30 day free card, thus giving Kerry Mason about 6 weeks to determine his new insurance.   Once he has a new Medicare D plan, he will call the office so we can determine co-pay affordability.    Asked that patient check home BP readings once daily and gave instructions for proper technique.  If his home BP readings drop too low, he knows to call the office, we may need to decrease spironolactone dose.  Also reviewed information about starting SGLT-2 inhibitors, but will hold off for now, until he determines insurance.     Tommy Medal PharmD CPP [facility] [address] [place], [place] [address] [phone]

## 2021-02-10 NOTE — Progress Notes (Signed)
Heart and Vascular Care Navigation  02/10/2021  Kerry Mason 1956-11-10 [id number]  Reason for Referral:  Pt w/ Medicare enrollment questions                                                                                                    Assessment:                                     LCSW met with pt prior to end of appointment w/ pharmacy team. Pt provided w/ SHIIP information to inquire about enrollment and next steps for ensuring he has appropriate coverage to continue to see specialty care and get his medications covered. LCSW also provided my number for any additional questions/concerns.   HRT/VAS Care Coordination    Patients Home Cardiology Office [place] Team Social Worker   Social Worker Name: Margarito Liner [place], [phone]   Living arrangements for the past 2 months Single Family Home   Patient Current Insurance Coverage Medicaid   Patient Has Concern With Paying Medical Bills No   Does Patient Have Prescription Coverage? Yes   Home Assistive Devices/Equipment None      Social History:                                                                             SDOH Screenings   Alcohol Screen: Not on file  Depression (PHQ2-9): Not on file  Financial Resource Strain: Not on file  Food Insecurity: Not on file  Housing: Not on file  Physical Activity: Not on file  Social Connections: Not on file  Stress: Not on file  Tobacco Use: High Risk   Smoking Tobacco Use: Current Every Day Smoker   Smokeless Tobacco Use: Never Used  Transportation Needs: Not on file    Other Care Navigation Interventions:     Patient Referred to: [facility]   Follow-up plan:   LCSW provided pt with my name and number. Pt given [employer]P number to speak with their coordinator about upcoming enrollments and benefits. I remain avialable

## 2021-02-11 ENCOUNTER — Other Ambulatory Visit: Payer: Self-pay

## 2021-03-10 ENCOUNTER — Telehealth: Payer: Self-pay

## 2021-03-10 NOTE — Telephone Encounter (Signed)
Prior authorization for [employer] completed. Key [id number].Waiting on approval.

## 2021-04-12 ENCOUNTER — Other Ambulatory Visit: Payer: Self-pay | Admitting: Cardiology

## 2021-04-14 ENCOUNTER — Ambulatory Visit (INDEPENDENT_AMBULATORY_CARE_PROVIDER_SITE_OTHER): Payer: Medicaid Other

## 2021-04-14 DIAGNOSIS — I5021 Acute systolic (congestive) heart failure: Secondary | ICD-10-CM

## 2021-04-14 DIAGNOSIS — I255 Ischemic cardiomyopathy: Secondary | ICD-10-CM

## 2021-04-14 LAB — CUP PACEART REMOTE DEVICE CHECK
Date Time Interrogation Session: 20220512102212
Implantable Lead Implant Date: 20150515
Implantable Lead Location: 753860
Implantable Lead Model: 365
Implantable Lead Serial Number: 10579264
Implantable Pulse Generator Implant Date: 20150515
Pulse Gen Model: 383594
Pulse Gen Serial Number: 60800912

## 2021-05-06 NOTE — Progress Notes (Signed)
Remote ICD transmission.   

## 2021-05-26 NOTE — Progress Notes (Deleted)
Cardiology Office Note   Date:  05/26/2021   ID:  Kerry Mason, DOB 01/18/1956, MRN [id number]  PCP:  Pcp, No  Cardiologist:   Kohler Pellerito Martinique, MD   No chief complaint on file.     History of Present Illness: Kerry Mason is a 65 y.o. male who presents for follow up CAD. He has a hx of CAD s/p CABG x 2 and multiple PCI, chronic systolic heart failure s/p ICD 2015, HTN, and tobacco use. Remote stenting of RCA in 2000, then lost to follow up. Anterior STEMI Jan 2015 treated with DES to LAD. EF by cath was 35% and ICD was placed. Abnormal nuclear stress test 11/2014 led to heart cath and subsequent CTO of RCA. Procedure was complicated by perforation of mid RCA and cardiac tamponade that required emergent pericardiocentesis with removal of 1L of blood. Perforation sealed and covered with stent to the mid RCA; remainder of RCA wa sstented with 4 additional DES. Pt suffered pericarditis after this PCI and developed PAF. He had a NSTEMI 06/2015 with reocclusion of mid RCA, treated medically with imdur. Left main FFR was abnormal and he was referred to CT surgery. CABG x 2 08/2015 with LIMA-LAD and SVG-ramus. RCA not bypassed. He had recurrence of chest pain 08/2017 leading to heart cath that showed patent LIMA-LAD, SVG-ramus, and occluded RCA with L-R collaterals. There were multiple abnormal vessels arising from LIMA graft, mediastinal mass was a possibility. CT chest showed 4.1 cm AAA. He was last seen by Dr. 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 He follows with [facility] for abdominal aortic aneurysm 4.9 cm.   He presented to [facility] in February with chest pain. Symptoms somewhat atypical with flat HST. Given his known CAD, heart cath was repeated. PCI attempted of ostial LCx, but was unsuccessful.  He was loaded with plavix in the cath lab, but this was not continued given his need for DOAC.   On follow up today he reports no further chest pain. No dyspnea. Still has significant  fatigue. On follow up lab work he was noted to have low magnesium level of 1.4 and low Vit D. He has started OTC magnesium 400 mg daily and Vit D 2000 units daily. He is now on Zetia for hypercholesterolemia. He was denied PA for [facility] prior to DC for unclear reasons. He has not stopped smoking but wants to try Chantix again.    Past Medical History:  Diagnosis Date   Acute systolic CHF (congestive heart failure) (HCC)    AICD (automatic cardioverter/defibrillator) present    Anxiety    Ascending aortic aneurysm ([place]) 08/31/2017   Back pain 08/31/2017   Basal cell carcinoma    left arm   CAD (coronary artery disease)    a. 2000: s/p stent of RCA 2000 with BMS  b. 2015: STEMI s/p LHC with old occlusion of RCA and DESx2 to LAD  c. 03/05/69: complicated PCI on 3/2 for CTO of mid RCA with coronary perforation and cardiac tamponade requiring emergent pericardiocentesis      Cardiac tamponade    a. 02/03/15 2/2 coronary perforation during CTO procedure. Sealed with graftmaster coated stent.    COPD (chronic obstructive pulmonary disease) ([place]) 08/31/2017   Gout    History of pneumonia    HTN (hypertension)    Hyperlipidemia    Ischemic cardiomyopathy    a. 2D ECHO: EF 25-30%. Akinesis of the anteroseptal and   Left main coronary artery disease  Myocardial infarction ([place])    Obesity    S/P CABG x 2 09/02/2015   LIMA to LAD, SVG to ramus intermediate branch, EVH via right thigh    Shortness of breath dyspnea    Tobacco abuse    Urinary frequency     Past Surgical History:  Procedure Laterality Date   CARDIAC CATHETERIZATION     CARDIAC CATHETERIZATION N/A 06/14/2015   Procedure: Left Heart Cath and Coronary Angiography;  Surgeon: Lorretta Harp, MD;  Location: [place] CV LAB;  Service: Cardiovascular;  Laterality: N/A;   CARDIAC CATHETERIZATION N/A 08/18/2015   Procedure: Intravascular Pressure Wire/FFR Study;  Surgeon: Isamar Wellbrock M Martinique, MD;  Location: [place] CV [facility];  Service:  Cardiovascular;  Laterality: N/A;   COLONOSCOPY     CORONARY ANGIOPLASTY  2000   CORONARY ANGIOPLASTY WITH STENT PLACEMENT  2015   CORONARY ARTERY BYPASS GRAFT N/A 09/02/2015   Procedure: CORONARY ARTERY BYPASS GRAFTING (CABG) x 2 (LIMA-LAD, SVG-Intermediate) ENDOSCOPIC GREATER SAPHENOUS VEIN HARVEST RIGHT THIGH;  Surgeon: Rexene Alberts, MD;  Location: [place];  Service: Open Heart Surgery;  Laterality: N/A;   CORONARY BALLOON ANGIOPLASTY N/A 01/04/2021   Procedure: CORONARY BALLOON ANGIOPLASTY;  Surgeon: Jettie Booze, MD;  Location: [place] [facility];  Service: Cardiovascular;  Laterality: N/A;   FOOT SURGERY     IMPLANTABLE CARDIOVERTER DEFIBRILLATOR IMPLANT N/A 04/17/2014   Procedure: IMPLANTABLE CARDIOVERTER DEFIBRILLATOR IMPLANT;  Surgeon: Evans Lance, MD;  Location: [facility] CATH LAB;  Service: Cardiovascular;  Laterality: N/A;   LEFT HEART CATH AND CORONARY ANGIOGRAPHY N/A 01/04/2021   Procedure: LEFT HEART CATH AND CORONARY ANGIOGRAPHY;  Surgeon: Jettie Booze, MD;  Location: [place] [facility];  Service: Cardiovascular;  Laterality: N/A;   LEFT HEART CATH AND CORS/GRAFTS ANGIOGRAPHY N/A 08/30/2017   Procedure: LEFT HEART CATH AND CORS/GRAFTS ANGIOGRAPHY;  Surgeon: Nelva Bush, MD;  Location: [place] [facility];  Service: Cardiovascular;  Laterality: N/A;   LEFT HEART CATHETERIZATION WITH CORONARY ANGIOGRAM N/A 12/11/2013   Procedure: LEFT HEART CATHETERIZATION WITH CORONARY ANGIOGRAM;  Surgeon: Bradan Congrove M Martinique, MD;  Location: [facility] CATH LAB;  Service: Cardiovascular;  Laterality: N/A;   LEFT HEART CATHETERIZATION WITH CORONARY ANGIOGRAM N/A 01/05/2015   Procedure: LEFT HEART CATHETERIZATION WITH CORONARY ANGIOGRAM;  Surgeon: Keiron Iodice M Martinique, MD;  Location: [facility] CATH LAB;  Service: Cardiovascular;  Laterality: N/A;   PERCUTANEOUS CORONARY STENT INTERVENTION (PCI-S)  12/11/2013   Procedure: PERCUTANEOUS CORONARY STENT INTERVENTION (PCI-S);  Surgeon: Darrall Strey M Martinique, MD;  Location: [facility] CATH  LAB;  Service: Cardiovascular;;  prov LAD and mid LAD   PERCUTANEOUS CORONARY STENT INTERVENTION (PCI-S) N/A 02/03/2015   Procedure: PERCUTANEOUS CORONARY STENT INTERVENTION (PCI-S);  Surgeon: Jettie Booze, MD;  Location: [facility] CATH LAB;  Service: Cardiovascular;  Laterality: N/A;   TEE WITHOUT CARDIOVERSION N/A 09/02/2015   Procedure: TRANSESOPHAGEAL ECHOCARDIOGRAM (TEE);  Surgeon: Rexene Alberts, MD;  Location: [place];  Service: Open Heart Surgery;  Laterality: N/A;     Current Outpatient Medications  Medication Sig Dispense Refill   ezetimibe (ZETIA) 10 MG tablet TAKE 1 TABLET BY MOUTH DAILY. 90 tablet 3   acetaminophen (TYLENOL) 325 MG tablet Take 2 tablets (650 mg total) by mouth every 4 (four) hours as needed for headache or mild pain.     allopurinol (ZYLOPRIM) 300 MG tablet Take 1 tablet (300 mg total) by mouth daily. 90 tablet 3   apixaban (ELIQUIS) 5 MG TABS tablet Take 1 tablet (5 mg total) by  mouth 2 (two) times daily. 180 tablet 0   aspirin EC 81 MG tablet Take 81 mg by mouth daily.     atorvastatin (LIPITOR) 80 MG tablet Take 1 tablet (80 mg total) by mouth daily. 90 tablet 1   carvedilol (COREG) 6.25 MG tablet Take 1 tablet (6.25 mg total) by mouth 2 (two) times daily. 180 tablet 3   cholecalciferol (VITAMIN D3) 25 MCG (1000 UNIT) tablet Take 1,000 Units by mouth daily.     furosemide (LASIX) 40 MG tablet Take 1 tablet (40 mg total) by mouth daily. 90 tablet 1   isosorbide mononitrate (IMDUR) 30 MG 24 hr tablet Take 1 tablet (30 mg total) by mouth daily. 90 tablet 1   Magnesium Oxide 200 MG TABS Take 1 tablet (200 mg total) by mouth 2 (two) times daily. 60 tablet 0   nitroGLYCERIN (NITROSTAT) 0.4 MG SL tablet DISSOLVE ONE TABLET UNDER THE TONGUE EVERY 5 MINUTES AS NEEDED FOR CHEST PAIN.  DO NOT EXCEED A TOTAL OF 3 DOSES IN 15 MINUTES 25 tablet 2   sacubitril-valsartan (ENTRESTO) 24-26 MG Take 1 tablet by mouth 2 (two) times daily. 60 tablet 5   spironolactone (ALDACTONE) 25 MG  tablet TAKE 1 TABLET (25 MG TOTAL) BY MOUTH AT BEDTIME. 90 tablet 3   varenicline (CHANTIX CONTINUING MONTH PAK) 1 MG tablet Take 1 tablet (1 mg total) by mouth 2 (two) times daily. 60 tablet 1   varenicline (CHANTIX STARTING MONTH PAK) 0.5 MG X 11 & 1 MG X 42 tablet Take one 0.5 mg tablet by mouth once daily for 3 days, then increase to one 0.5 mg tablet twice daily for 4 days, then increase to one 1 mg tablet twice daily. 53 tablet 0   Current Facility-Administered Medications  Medication Dose Route Frequency Provider Last Rate Last Admin   nicotine (NICODERM CQ - dosed in mg/24 hr) patch 14 mg  14 mg Transdermal Daily Duke, Tami Lin, PA        Allergies:   Patient has no known allergies.    Social History:  The patient  reports that he has been smoking cigarettes. He has a 17.50 pack-year smoking history. He has never used smokeless tobacco. He reports that he does not drink alcohol and does not use drugs.   Family History:  The patient's family history includes CAD in his father; Cancer in his mother.    ROS:  Please see the history of present illness.   Otherwise, review of systems are positive for none.   All other systems are reviewed and negative.    PHYSICAL EXAM: VS:  There were no vitals taken for this visit. , BMI There is no height or weight on file to calculate BMI. GEN: Well nourished, well developed, in no acute distress HEENT: normal Neck: no JVD, carotid bruits, or masses Cardiac: RRR; no murmurs, rubs, or gallops,no edema  Respiratory:  clear to auscultation bilaterally, normal work of breathing GI: soft, nontender, nondistended, + BS MS: no deformity or atrophy Skin: warm and dry, no rash Neuro:  Strength and sensation are intact Psych: euthymic mood, full affect   EKG:  EKG {ACTION; IS/IS SNK:[id number]} ordered today. The ekg ordered today demonstrates ***   Recent Labs: 01/18/2021: ALT 15; BUN 17; Creatinine, Ser 0.94; Hemoglobin 16.8; Magnesium 1.4;  Platelets 318; Potassium 5.2; Sodium 139; TSH 2.080    Lipid Panel    Component Value Date/Time   CHOL 181 01/04/2021 0236   CHOL 187 03/18/2020 1522  TRIG 169 (H) 01/04/2021 0236   HDL 42 01/04/2021 0236   HDL 35 (L) 03/18/2020 1522   CHOLHDL 4.3 01/04/2021 0236   VLDL 34 01/04/2021 0236   LDLCALC 105 (H) 01/04/2021 0236   LDLCALC 118 (H) 03/18/2020 1522      Wt Readings from Last 3 Encounters:  02/10/21 227 lb 8 oz (103.2 kg)  02/09/21 213 lb (96.6 kg)  01/18/21 224 lb (101.6 kg)      Other studies Reviewed: Additional studies/ records that were reviewed today include: .  Cath: 01/04/21   LM lesion is 50% stenosed. Patent LIMA to LAD. Patent SVG to ramus. 2nd Diag lesion is 70% stenosed. Mid RCA to Dist RCA lesion is 100% stenosed. Prox RCA to Mid RCA lesion is 100% stenosed. Ost RCA to Prox RCA lesion is 95% stenosed. Ost Cx lesion is 80% stenosed. This territory is not bypassed, but it is a relatively small vessel. Unable to advance balloon after successfully guiding the wire though the ramus graft and retrograde to the ostial circumflex and across the lesion. The left ventricular ejection fraction is 25-35% by visual estimate. There is moderate to severe left ventricular systolic dysfunction. LV end diastolic pressure is normal. There is no aortic valve stenosis.   Medical therapy for CAD.  Unsuccessful attempt at PCI of ostial circumflex.  Restart IV heparin 8 hours post sheath pull.  OK to start [place] tomorrow for atrial fibrillation.  He was loaded with Plavix but will not continue if he is starting a DOAC.  If he is not starting a DOAC, DAPT would be reasonable for his CAD.     Diagnostic Dominance: Right      Echo: 01/04/21   IMPRESSIONS     1. There is no left ventricular thrombus (Definity contrast was used).  There is apical dyskinesis. There is severe inferior wall hypokinesis and  inferoseptal akinesis.. Left ventricular ejection fraction, by  estimation,  is 25 to 30%. The left ventricle  has severely decreased function. The left ventricle demonstrates regional  wall motion abnormalities (see scoring diagram/findings for description).  The left ventricular internal cavity size was moderately to severely  dilated. Left ventricular diastolic  parameters are consistent with Grade I diastolic dysfunction (impaired  relaxation).   2. Right ventricular systolic function is normal. The right ventricular  size is normal. There is normal pulmonary artery systolic pressure. The  estimated right ventricular systolic pressure is 57.0 mmHg.   3. Left atrial size was severely dilated.   4. The mitral valve is normal in structure. Trivial mitral valve  regurgitation.   5. The aortic valve is tricuspid. There is mild calcification of the  aortic valve. There is mild thickening of the aortic valve. Aortic valve  regurgitation is not visualized. Mild aortic valve sclerosis is present,  with no evidence of aortic valve  stenosis.   6. Aortic dilatation noted. There is mild dilatation of the aortic root,  measuring 39 mm.   7. The inferior vena cava is normal in size with greater than 50%  respiratory variability, suggesting right atrial pressure of 3 mmHg.         ASSESSMENT AND PLAN:  1.  1. CAD s/p CABG x 2, multiple PCI. Patent LIMA to the LAD, SVG to ramus CTO of RCA complicated by perforation, tamponade, and pericarditis Unsuccessful PCI of Cx - 01/04/21- I personally reviewed films. The angulation into the LCx is not favorable for PCI and it looks like the LCx is  adequately supplied by the SVG to ramus - he has no further chest pain - continue ASA - continue imdur     2. Paroxysmal atrial fibrillation - recently identified on ICD - started on eliquis 5 mg BID This patients CHA2DS2-VASc Score and unadjusted Ischemic Stroke Rate (% per year) is equal to 3.2 % stroke rate/year from a score of 3 (CHF, CAD, HTN)     3. Fatigue and  poor sleep - will repeat lab in a couple of weeks to check Mg and Vit D levels again.      4. Chronic systolic and diastolic heart failure - echo with EF 30-35% - recommendations to transition to entresto - PA was submitted prior to discharge but this was denied- unclear why. - will continue lisinopril and lasix - I will message PharmD to see if there is a reason PA was denied for entresto - may need to resubmit. If we can get approved he will need to stop ACEi 3 days prior to starting.      5. Hypertension - continue coreg, imdur, lisinopril, lasix, and spiro     6. Hyperlipidemia with LDL goal < 70 01/04/2021: Cholesterol 181; HDL 42; LDL Cholesterol 105; Triglycerides 169; VLDL 34 - continue statin - zetia was added at discharge - repeat lipids in 2 weeks. If still not at goal will refer to lipid clinic     7. Smoker - he is interested in cessation - willing to try Chantix again.       Current medicines are reviewed at length with the patient today.  The patient {ACTIONS; HAS/DOES NOT HAVE:19233} concerns regarding medicines.  The following changes have been made:  {PLAN; NO CHANGE:13088:s}  Labs/ tests ordered today include: *** No orders of the defined types were placed in this encounter.    Disposition:   FU with *** in {gen number 2-53:664403} {Days to years:10300}  Signed, Skyler Dusing Martinique, MD  05/26/2021 8:47 AM    [facility] [address], [place], [place], [address] Phone [phone], Fax [phone]

## 2021-05-30 ENCOUNTER — Ambulatory Visit: Payer: Medicaid Other | Admitting: Cardiology

## 2021-07-14 ENCOUNTER — Ambulatory Visit (INDEPENDENT_AMBULATORY_CARE_PROVIDER_SITE_OTHER): Payer: Medicaid Other

## 2021-07-14 DIAGNOSIS — I255 Ischemic cardiomyopathy: Secondary | ICD-10-CM

## 2021-07-15 LAB — CUP PACEART REMOTE DEVICE CHECK
Date Time Interrogation Session: 20220811065520
Implantable Lead Implant Date: 20150515
Implantable Lead Location: 753860
Implantable Lead Model: 365
Implantable Lead Serial Number: 10579264
Implantable Pulse Generator Implant Date: 20150515
Pulse Gen Model: 383594
Pulse Gen Serial Number: 60800912

## 2021-08-05 NOTE — Progress Notes (Signed)
Remote ICD transmission.   

## 2021-10-04 ENCOUNTER — Telehealth: Payer: Self-pay | Admitting: *Deleted

## 2021-10-04 NOTE — Telephone Encounter (Signed)
   [facility] Pre-operative Risk Assessment    Patient Name: Kerry Mason  DOB: Nov 28, 1956 MRN: [id number]    Request for surgical clearance:  What type of surgery is being performed? Cataract extraction by PE, IOL left then  Right   When is this surgery scheduled? 10/20/21  What type of clearance is required (medical clearance vs. Pharmacy clearance to hold med vs. Both)? Both   Are there any medications that need to be held prior to surgery and how long?  Eliquis , aspirin   Practice name and name of physician performing surgery? [facility], Dr Julian Reil   at [facility]  What is the office phone number? [phone] x [phone]   7.   What is the office fax number? [phone]   8.   Anesthesia type (None, local, MAC, general) ? IV sedation   Raiford Simmonds 10/04/2021, 4:09 PM  _________________________________________________________________   (provider comments below)

## 2021-10-04 NOTE — Telephone Encounter (Signed)
   Patient Name: Kerry Mason  DOB: 1956-09-25 MRN: [id number]  Primary Cardiologist: Peter Martinique, MD  Chart reviewed as part of pre-operative protocol coverage. Cataract extractions are recognized in guidelines as low risk surgeries that do not typically require specific preoperative testing or holding of blood thinner therapy. Therefore, given past medical history and time since last visit, based on ACC/AHA guidelines, Leovardo Alber would be at acceptable risk for the planned procedure without further cardiovascular testing.   I will route this recommendation to the requesting party via Epic fax function and remove from pre-op pool.  Please call with questions.  Tami Lin Aalivia Mcgraw, PA 10/04/2021, 5:10 PM

## 2021-10-06 ENCOUNTER — Telehealth: Payer: Self-pay

## 2021-10-06 NOTE — Telephone Encounter (Signed)
   [facility] Pre-operative Risk Assessment    Patient Name: Kerry Mason  DOB: 12/26/1955 MRN: [id number]  HEARTCARE STAFF:  - IMPORTANT!!!!!! Under Visit Info/Reason for Call, type in Other and utilize the format Clearance MM/DD/YY or Clearance TBD. Do not use dashes or single digits. - Please review there is not already an duplicate clearance open for this procedure. - If request is for dental extraction, please clarify the # of teeth to be extracted. - If the patient is currently at the dentist's office, call Pre-Op Callback Staff (MA/nurse) to input urgent request.  - If the patient is not currently in the dentist office, please route to the Pre-Op pool.  Request for surgical clearance:  What type of surgery is being performed? Cataract Extraction By PE, IOL-Left then Right   When is this surgery scheduled? 10/20/2021  What type of clearance is required (medical clearance vs. Pharmacy clearance to hold med vs. Both)? Medical   Are there any medications that need to be held prior to surgery and how long? None   Practice name and name of physician performing surgery? [facility] What is the office phone number? [phone]   7.   What is the office fax number? [phone]  8.   Anesthesia type (None, local, MAC, general) ? Unknown    Kerry Mason 10/06/2021, 4:21 PM  _________________________________________________________________   (provider comments below)

## 2021-10-06 NOTE — Telephone Encounter (Signed)
   Patient Name: Kerry Mason  DOB: 1956/01/02 MRN: [id number]  Primary Cardiologist: Peter Martinique, MD  Chart reviewed as part of pre-operative protocol coverage. Cataract extractions are recognized in guidelines as low risk surgeries that do not typically require specific preoperative testing or holding of blood thinner therapy. Therefore, given past medical history and time since last visit, based on ACC/AHA guidelines, [name] would be at acceptable risk for the planned procedure without further cardiovascular testing.   I will route this recommendation to the requesting party via Epic fax function and remove from pre-op pool.  Please call with questions.  Tami Lin Sanaz Scarlett, PA 10/06/2021, 5:30 PM

## 2021-10-13 ENCOUNTER — Ambulatory Visit (INDEPENDENT_AMBULATORY_CARE_PROVIDER_SITE_OTHER): Payer: Medicaid Other

## 2021-10-13 DIAGNOSIS — I255 Ischemic cardiomyopathy: Secondary | ICD-10-CM

## 2021-10-14 LAB — CUP PACEART REMOTE DEVICE CHECK
Date Time Interrogation Session: 20221111093953
Implantable Lead Implant Date: 20150515
Implantable Lead Location: 753860
Implantable Lead Model: 365
Implantable Lead Serial Number: 10579264
Implantable Pulse Generator Implant Date: 20150515
Pulse Gen Model: 383594
Pulse Gen Serial Number: 60800912

## 2021-10-18 ENCOUNTER — Other Ambulatory Visit: Payer: Self-pay

## 2021-10-18 DIAGNOSIS — I714 Abdominal aortic aneurysm, without rupture, unspecified: Secondary | ICD-10-CM

## 2021-10-24 NOTE — Progress Notes (Signed)
Remote ICD transmission.   

## 2021-11-03 ENCOUNTER — Telehealth: Payer: Self-pay | Admitting: Cardiology

## 2021-11-03 DIAGNOSIS — I255 Ischemic cardiomyopathy: Secondary | ICD-10-CM

## 2021-11-03 DIAGNOSIS — Z95810 Presence of automatic (implantable) cardiac defibrillator: Secondary | ICD-10-CM

## 2021-11-03 MED ORDER — ATORVASTATIN CALCIUM 80 MG PO TABS: 80.0000 mg | ORAL_TABLET | Freq: Every day | ORAL | 3 refills | Status: DC

## 2021-11-03 MED ORDER — FUROSEMIDE 40 MG PO TABS: 40.0000 mg | ORAL_TABLET | Freq: Every day | ORAL | 1 refills | Status: DC

## 2021-11-03 MED ORDER — CARVEDILOL 6.25 MG PO TABS: 6.2500 mg | ORAL_TABLET | Freq: Two times a day (BID) | ORAL | 3 refills | Status: DC

## 2021-11-03 MED ORDER — LISINOPRIL 5 MG PO TABS: 5.0000 mg | ORAL_TABLET | Freq: Every day | ORAL | 3 refills | Status: DC

## 2021-11-03 MED ORDER — ALLOPURINOL 300 MG PO TABS: 300.0000 mg | ORAL_TABLET | Freq: Every day | ORAL | 3 refills | Status: DC

## 2021-11-03 MED ORDER — SPIRONOLACTONE 25 MG PO TABS: 25.0000 mg | ORAL_TABLET | Freq: Every day | ORAL | 3 refills | Status: DC

## 2021-11-03 MED ORDER — ISOSORBIDE MONONITRATE ER 30 MG PO TB24: 30.0000 mg | ORAL_TABLET | Freq: Every day | ORAL | 3 refills | Status: DC

## 2021-11-03 MED ORDER — EZETIMIBE 10 MG PO TABS: 10.0000 mg | ORAL_TABLET | Freq: Every day | ORAL | 3 refills | Status: DC

## 2021-11-03 MED ORDER — SILDENAFIL CITRATE 50 MG PO TABS: ORAL_TABLET | ORAL | 3 refills | Status: DC

## 2021-11-03 NOTE — Telephone Encounter (Signed)
Viagra refill sent to pharmacy.

## 2021-11-03 NOTE — Telephone Encounter (Signed)
Pt c/o medication issue:  1. Name of Medication: apixaban (ELIQUIS) 5 MG TABS tablet  2. How are you currently taking this medication (dosage and times per day)? Take 1 tablet (5 mg total) by mouth 2 (two) times daily.  3. Are you having a reaction (difficulty breathing--STAT)? no  4. What is your medication issue? Pts medication is really expensive... wanted to see if there are any additional options  Pt c/o medication issue:  1. Name of Medication: all medications  2. How are you currently taking this medication (dosage and times per day)? N/a  3. Are you having a reaction (difficulty breathing--STAT)? N/a  4. What is your medication issue? Pt has switched pharmacies and would like all of his medications to go to [facility], [place], [place] [address] / Phone: [phone]

## 2021-11-03 NOTE — Telephone Encounter (Signed)
OK to fill viagra 50 mg. Take 1-2 as needed prn  Niobe Dick Martinique MD, [facility]

## 2021-11-03 NOTE — Telephone Encounter (Signed)
Spoke to patient he stated he is changing pharmacies to [facility] on [address].Stated he never started on Entresto.He is taking Lisinopril 5 mg daily.He will discuss taking Entresto with Dr.Jordan next week at his appointment.He stated Eliquis is too expensive.Advised I will mail him patient assistance paperwork.He will bring back next week. He also wants a Viagra refill.He does not know mg.I will send message to [place].

## 2021-11-04 ENCOUNTER — Other Ambulatory Visit: Payer: Self-pay

## 2021-11-07 ENCOUNTER — Other Ambulatory Visit: Payer: Self-pay

## 2021-11-07 ENCOUNTER — Ambulatory Visit (INDEPENDENT_AMBULATORY_CARE_PROVIDER_SITE_OTHER): Payer: Medicare Other | Admitting: Physician Assistant

## 2021-11-07 ENCOUNTER — Ambulatory Visit (HOSPITAL_COMMUNITY)
Admission: RE | Admit: 2021-11-07 | Discharge: 2021-11-07 | Disposition: A | Discharge status: Home / Self Care | Payer: Medicare Other | Source: Ambulatory Visit | Attending: Physician Assistant | Admitting: Physician Assistant

## 2021-11-07 VITALS — BP 128/80 | HR 59 | Temp 98.2°F | Resp 20 | Ht 73.0 in | Wt 232.5 lb

## 2021-11-07 DIAGNOSIS — I714 Abdominal aortic aneurysm, without rupture, unspecified: Secondary | ICD-10-CM

## 2021-11-07 DIAGNOSIS — I70212 Atherosclerosis of native arteries of extremities with intermittent claudication, left leg: Secondary | ICD-10-CM

## 2021-11-07 MED ORDER — APIXABAN 5 MG PO TABS: 5.0000 mg | ORAL_TABLET | Freq: Two times a day (BID) | ORAL | 3 refills | Status: DC

## 2021-11-07 NOTE — Progress Notes (Signed)
Office Note     CC:  follow up Requesting Provider:  No ref. provider found  HPI: Kerry Mason is a 65 y.o. (07/05/1956) male who presents for surveillance follow up of AAA. His aneurysm was found incidentally in 2021 on CT scan evaluation for kidney stones. His CT scan initially showed a 4.9 cm aneurysm. Duplex showing approximately the same at 4.87 cm at the time of his initial visit in August of 2021 with Dr. Donzetta Matters. He has not had any associated back or abdominal pain. He has no family history of AAA.   He does report claudication symptoms in LLE. Says this occurs sometimes after a couple steps and other times he can ambulate without discomfort at all. He describes it as a tightness or pain in the calf. He says if he walks more flat footed it helps. He does not get the same symptoms in his right leg. He denies any pain at rest and no non healing wounds. He continues to smoke 1/2 ppd.   The pt is on a statin for cholesterol management. Also takes Zetia The pt is on a daily aspirin.   Other AC:  Eliquis The pt is on ACE, BB, Diuretic for hypertension.   The pt is not diabetic.   Tobacco hx:  current, 1/2 ppd  Past Medical History:  Diagnosis Date   Acute systolic CHF (congestive heart failure) (HCC)    AICD (automatic cardioverter/defibrillator) present    Anxiety    Ascending aortic aneurysm 08/31/2017   Back pain 08/31/2017   Basal cell carcinoma    left arm   CAD (coronary artery disease)    a. 2000: s/p stent of RCA 2000 with BMS  b. 2015: STEMI s/p LHC with old occlusion of RCA and DESx2 to LAD  c. 04/05/60: complicated PCI on 3/2 for CTO of mid RCA with coronary perforation and cardiac tamponade requiring emergent pericardiocentesis      Cardiac tamponade    a. 02/03/15 2/2 coronary perforation during CTO procedure. Sealed with graftmaster coated stent.    COPD (chronic obstructive pulmonary disease) ([place]) 08/31/2017   Gout    History of pneumonia    HTN (hypertension)     Hyperlipidemia    Ischemic cardiomyopathy    a. 2D ECHO: EF 25-30%. 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   LIMA to LAD, SVG to ramus intermediate branch, EVH via right thigh    Shortness of breath dyspnea    Tobacco abuse    Urinary frequency     Past Surgical History:  Procedure Laterality Date   CARDIAC CATHETERIZATION     CARDIAC CATHETERIZATION N/A 06/14/2015   Procedure: Left Heart Cath and Coronary Angiography;  Surgeon: Lorretta Harp, MD;  Location: [place] [facility];  Service: Cardiovascular;  Laterality: N/A;   CARDIAC CATHETERIZATION N/A 08/18/2015   Procedure: Intravascular Pressure Wire/FFR Study;  Surgeon: Peter M Martinique, MD;  Location: [place] [facility];  Service: Cardiovascular;  Laterality: N/A;   COLONOSCOPY     CORONARY ANGIOPLASTY  2000   CORONARY ANGIOPLASTY WITH STENT PLACEMENT  2015   CORONARY ARTERY BYPASS GRAFT N/A 09/02/2015   Procedure: CORONARY ARTERY BYPASS GRAFTING (CABG) x 2 (LIMA-LAD, SVG-Intermediate) ENDOSCOPIC GREATER SAPHENOUS VEIN HARVEST RIGHT THIGH;  Surgeon: Rexene Alberts, MD;  Location: [place];  Service: Open Heart Surgery;  Laterality: N/A;  CORONARY BALLOON ANGIOPLASTY N/A 01/04/2021   Procedure: CORONARY BALLOON ANGIOPLASTY;  Surgeon: Jettie Booze, MD;  Location: [place] [facility];  Service: Cardiovascular;  Laterality: N/A;   FOOT SURGERY     IMPLANTABLE CARDIOVERTER DEFIBRILLATOR IMPLANT N/A 04/17/2014   Procedure: IMPLANTABLE CARDIOVERTER DEFIBRILLATOR IMPLANT;  Surgeon: Evans Lance, MD;  Location: [facility] CATH LAB;  Service: Cardiovascular;  Laterality: N/A;   LEFT HEART CATH AND CORONARY ANGIOGRAPHY N/A 01/04/2021   Procedure: LEFT HEART CATH AND CORONARY ANGIOGRAPHY;  Surgeon: Jettie Booze, MD;  Location: [place] [facility];  Service: Cardiovascular;  Laterality: N/A;   LEFT HEART CATH AND CORS/GRAFTS ANGIOGRAPHY N/A  08/30/2017   Procedure: LEFT HEART CATH AND CORS/GRAFTS ANGIOGRAPHY;  Surgeon: Nelva Bush, MD;  Location: [place] [facility];  Service: Cardiovascular;  Laterality: N/A;   LEFT HEART CATHETERIZATION WITH CORONARY ANGIOGRAM N/A 12/11/2013   Procedure: LEFT HEART CATHETERIZATION WITH CORONARY ANGIOGRAM;  Surgeon: Peter M Martinique, MD;  Location: [facility] CATH LAB;  Service: Cardiovascular;  Laterality: N/A;   LEFT HEART CATHETERIZATION WITH CORONARY ANGIOGRAM N/A 01/05/2015   Procedure: LEFT HEART CATHETERIZATION WITH CORONARY ANGIOGRAM;  Surgeon: Peter M Martinique, MD;  Location: [facility] CATH LAB;  Service: Cardiovascular;  Laterality: N/A;   PERCUTANEOUS CORONARY STENT INTERVENTION (PCI-S)  12/11/2013   Procedure: PERCUTANEOUS CORONARY STENT INTERVENTION (PCI-S);  Surgeon: Peter M Martinique, MD;  Location: [facility] CATH LAB;  Service: Cardiovascular;;  prov LAD and mid LAD   PERCUTANEOUS CORONARY STENT INTERVENTION (PCI-S) N/A 02/03/2015   Procedure: PERCUTANEOUS CORONARY STENT INTERVENTION (PCI-S);  Surgeon: Jettie Booze, MD;  Location: [facility] CATH LAB;  Service: Cardiovascular;  Laterality: N/A;   TEE WITHOUT CARDIOVERSION N/A 09/02/2015   Procedure: TRANSESOPHAGEAL ECHOCARDIOGRAM (TEE);  Surgeon: Rexene Alberts, MD;  Location: [place];  Service: Open Heart Surgery;  Laterality: N/A;    Social History   Socioeconomic History   Marital status: Married    Spouse name: Not on file   Number of children: 2   Years of education: Not on file   Highest education level: Not on file  Occupational History   Occupation: [employer]  Tobacco Use   Smoking status: Every Day    Packs/day: 0.50    Years: 35.00    Pack years: 17.50    Types: Cigarettes   Smokeless tobacco: Never  Vaping Use   Vaping Use: Never used  Substance and Sexual Activity   Alcohol use: No    Alcohol/week: 0.0 standard drinks   Drug use: No   Sexual activity: Not Currently  Other Topics Concern   Not on file  Social History  Narrative   Not on file   Social Determinants of Health   Financial Resource Strain: Not on file  Food Insecurity: Not on file  Transportation Needs: Not on file  Physical Activity: Not on file  Stress: Not on file  Social Connections: Not on file  Intimate Partner Violence: Not on file    Family History  Problem Relation Age of Onset   CAD Father        PTCA   Cancer Mother        LYMPHOMA    Current Outpatient Medications  Medication Sig Dispense Refill   acetaminophen (TYLENOL) 325 MG tablet Take 2 tablets (650 mg total) by mouth every 4 (four) hours as needed for headache or mild pain.     allopurinol (ZYLOPRIM) 300 MG tablet Take 1 tablet (300 mg total) by mouth daily. [place]  tablet 3   aspirin EC 81 MG tablet Take 81 mg by mouth daily.     atorvastatin (LIPITOR) 80 MG tablet Take 1 tablet (80 mg total) by mouth daily. 90 tablet 3   carvedilol (COREG) 6.25 MG tablet Take 1 tablet (6.25 mg total) by mouth 2 (two) times daily. 180 tablet 3   cholecalciferol (VITAMIN D3) 25 MCG (1000 UNIT) tablet Take 1,000 Units by mouth daily.     ezetimibe (ZETIA) 10 MG tablet Take 1 tablet (10 mg total) by mouth daily. 90 tablet 3   furosemide (LASIX) 40 MG tablet Take 1 tablet (40 mg total) by mouth daily. 90 tablet 1   isosorbide mononitrate (IMDUR) 30 MG 24 hr tablet Take 1 tablet (30 mg total) by mouth daily. 90 tablet 3   lisinopril (ZESTRIL) 5 MG tablet Take 1 tablet (5 mg total) by mouth daily. 90 tablet 3   Magnesium Oxide 200 MG TABS Take 1 tablet (200 mg total) by mouth 2 (two) times daily. 60 tablet 0   nitroGLYCERIN (NITROSTAT) 0.4 MG SL tablet DISSOLVE ONE TABLET UNDER THE TONGUE EVERY 5 MINUTES AS NEEDED FOR CHEST PAIN.  DO NOT EXCEED A TOTAL OF 3 DOSES IN 15 MINUTES 25 tablet 2   ofloxacin (OCUFLOX) 0.3 % ophthalmic solution Place into the right eye.     prednisoLONE acetate (PRED FORTE) 1 % ophthalmic suspension Place into the right eye.     sildenafil (VIAGRA) 50 MG tablet  Take 1 to 2 tablets as needed 10 tablet 3   spironolactone (ALDACTONE) 25 MG tablet Take 1 tablet (25 mg total) by mouth at bedtime. 90 tablet 3   varenicline (CHANTIX CONTINUING MONTH PAK) 1 MG tablet Take 1 tablet (1 mg total) by mouth 2 (two) times daily. 60 tablet 1   varenicline (CHANTIX STARTING MONTH PAK) 0.5 MG X 11 & 1 MG X 42 tablet Take one 0.5 mg tablet by mouth once daily for 3 days, then increase to one 0.5 mg tablet twice daily for 4 days, then increase to one 1 mg tablet twice daily. 53 tablet 0   apixaban (ELIQUIS) 5 MG TABS tablet Take 1 tablet (5 mg total) by mouth 2 (two) times daily. 180 tablet 3   Current Facility-Administered Medications  Medication Dose Route Frequency Provider Last Rate Last Admin   nicotine (NICODERM CQ - dosed in mg/24 hr) patch 14 mg  14 mg Transdermal Daily Duke, Tami Lin, PA        No Known Allergies   REVIEW OF SYSTEMS:  [X]  denotes positive finding, [ ]  denotes negative finding Cardiac  Comments:  Chest pain or chest pressure:    Shortness of breath upon exertion:    Short of breath when lying flat:    Irregular heart rhythm:        Vascular    Pain in calf, thigh, or hip brought on by ambulation:    Pain in feet at night that wakes you up from your sleep:     Blood clot in your veins:    Leg swelling:         Pulmonary    Oxygen at home:    Productive cough:     Wheezing:         Neurologic    Sudden weakness in arms or legs:     Sudden numbness in arms or legs:     Sudden onset of difficulty speaking or slurred speech:    Temporary loss of  vision in one eye:     Problems with dizziness:         Gastrointestinal    Blood in stool:     Vomited blood:         Genitourinary    Burning when urinating:     Blood in urine:        Psychiatric    Major depression:         Hematologic    Bleeding problems:    Problems with blood clotting too easily:        Skin    Rashes or ulcers:        Constitutional    Fever  or chills:      PHYSICAL EXAMINATION:  Vitals:   11/07/21 0847  BP: 128/80  Pulse: (!) 59  Resp: 20  Temp: 98.2 F (36.8 C)  TempSrc: Temporal  SpO2: 95%  Weight: 232 lb 8 oz (105.5 kg)  Height: 6\' 1"  (1.854 m)    General:  WDWN in NAD; vital signs documented above Gait: Normal HENT: WNL, normocephalic Pulmonary: normal non-labored breathing , without wheezing Cardiac: regular HR, without  Murmurs without carotid bruit Abdomen: distended, soft, NT, no masses. No palpable AAA Vascular Exam/Pulses:  Right Left  Radial 2+ (normal) 2+ (normal)  Femoral 2+ (normal) 2+ (normal)  Popliteal Not palpable Not palpable  DP Not palpable Not palpable  PT Not palpable Not palpable  Doppler Dp/ PT/ Pero signals bilaterally. Feet warm and well perfused Extremities: without ischemic changes, without Gangrene , without cellulitis; without open wounds;  Musculoskeletal: no muscle wasting or atrophy  Neurologic: A&O X 3;  No focal weakness or paresthesias are detected Psychiatric:  The pt has Normal affect.   Non-Invasive Vascular Imaging:   Abdominal Aorta Findings:  +-----------+-------+----------+----------+--------+--------+--------+  Location   AP (cm)Trans (cm)PSV (cm/s)WaveformThrombusComments  +-----------+-------+----------+----------+--------+--------+--------+  Proximal   2.16   2.10      71                                  +-----------+-------+----------+----------+--------+--------+--------+  Mid        2.23   2.23      113                                 +-----------+-------+----------+----------+--------+--------+--------+  Distal     4.88   5.13      96                                  +-----------+-------+----------+----------+--------+--------+--------+  RT CIA Prox1.4    1.2       176                                 +-----------+-------+----------+----------+--------+--------+--------+  LT CIA Prox1.6    1.5       11                                   +-----------+-------+----------+----------+--------+--------+--------+   Summary:  Abdominal Aorta: The largest aortic diameter remains essentially unchanged compared to prior exam. Previous diameter measurement was 4.9 cm obtained on 07/16/2020.    ASSESSMENT/PLAN:: 65 y.o. male here  for follow up for surveillance follow up of AAA. He remains without any back pain or abdominal pain. He does have some LLE claudication symptoms. These are not currently lifestyle limiting. Clinically he has three vessel doppler signals in BLE. His duplex today shows slight increase in the greatest diameter of the AAA at 5.1 cm up from 4.9 cm. Re discussed that he would possibly need a repair in the future when it reaches 5.5 cm.  Until that time we will follow with every 21-month with abdominal aortic aneurysm duplex and when he has reached 5.5 cm we will get a CT with contrast to plan repair - Discussed importance of adequate blood pressure control - Encouraged smoking cessation -We re discussed the signs and symptoms of rupture and that he would need to seek urgent medical attention he demonstrates good understanding.   - He will follow him up in 6 months with repeat Aortic Duplex will also get ABI   Karoline Caldwell, PA-C [facility] [phone]  Clinic MD:   Trula Slade

## 2021-11-08 NOTE — Progress Notes (Signed)
Cardiology Office Note    Date:  11/11/2021   ID:  Kerry Mason, DOB Jan 22, 1956, MRN [id number]  PCP:  Pcp, No  Cardiologist:  Dr. Martinique   Chief Complaint  Patient presents with   Coronary Artery Disease    History of Present Illness:  Kerry Mason is a 65 y.o. male with CAD s/p CABG and multiple PCI, chronic systolic HF s/p ICD, HLD and tobacco abuse. He had a remote stenting of RCA in 2000 and was lost to follow-up. He ended up having anterior STEMI in January 2015, cardiac catheterization showed occluded proximal RCA with collaterals and the occlusion of the proximal LAD RCA occlusion was felt to be chronic, he was treated with drug-eluting stent to the occluded LAD. EF by cath was 35%. He subsequently required ICD placement. He had Myoview in December 2015 that was high risk with large area of anterior, septal, apical scar and inferior apical ischemia, EF was noted to be 25%. He underwent repeat cardiac cath that showed patent stents in the LAD, but first diagonal had 80% stenosis, chronically occluded RCA with collaterals. Due to his anginal symptom, he underwent a CTO of RCA, procedure was complicated by perforation of mid RCA which resulted in cardiac tamponade and required emergent pericardiocentesis and removal of 1L of blood. Perforation was sealed with Graftmaster and covered stent to the mid RCA. Remainder of his RCA was stented with 4 additional drug-eluting stents. He ended up having pericarditis afterward. He did develop brief PAF during the period of pericarditis.   He was admitted in July 2016 with NSTEMI. Cardiac catheterization showed reocclusion of mid RCA with left-to-right collaterals, he was treated medically with addition of Imdur. He also had a 50% stenosis in the left main, FFR was abnormal, he was referred for CT surgery. He underwent CABG by Dr. Roxy Manns in 9/16 with LIMA to LAD, SVG to ramus, RCA was not bypassed due to small target and significant scarring from the  prior pericarditis.   He was admitted to the hospital on 08/29/2017 with chest pain. He eventually underwent cardiac catheterization on 08/30/2017 with Dr. Saunders Revel which showed widely patent LIMA to LAD and SVG to ramus, moderate to severely reduced left ventricular contraction, multiple abnormal vessels arising from the LIMA graft, mediastinal mass is a possibility. Otherwise continue medical therapy and a secondary prevention of CAD. CT of chest showed moderate to severe upper lobe central lobar emphysema, 4.1 cm ascending aortic aneurysm, left lower paratracheal lymphadenopathy likely reactive, several scattered 2-3 mm pulmonary nodule.  He presented to [facility] in February with chest pain. Symptoms somewhat atypical with flat HST. Given his known CAD, heart cath was repeated. PCI attempted of ostial LCx, but was unsuccessful.  He was loaded with plavix in the cath lab, but this was not continued given his need for DOAC.   Was recently seen by [facility] for follow up of AAA. Measured 5.1 cm. Noted left calf claudication and they are planneding to check ABIs on follow up visit.    He now is farming full time raising rabbits, chickens, and quail. Has rare palpitations. States he was camping over a month ago. He had just finished eating and felt faint and disoriented. Diaphoresis.  This was apparently before his ICD check in November which showed no arrhythmia. Has no chest pain or dyspnea. He is smoking but wants to try and quit again. No success with Chantix.    Past Medical History:  Diagnosis Date   Acute systolic CHF (  congestive heart failure) ([place])    AICD (automatic cardioverter/defibrillator) present    Anxiety    Ascending aortic aneurysm 08/31/2017   Back pain 08/31/2017   Basal cell carcinoma    left arm   CAD (coronary artery disease)    a. 2000: s/p stent of RCA 2000 with BMS  b. 2015: STEMI s/p LHC with old occlusion of RCA and DESx2 to LAD  c. 06/04/23: complicated PCI on 3/2 for CTO of mid RCA with  coronary perforation and cardiac tamponade requiring emergent pericardiocentesis      Cardiac tamponade    a. 02/03/15 2/2 coronary perforation during CTO procedure. Sealed with graftmaster coated stent.    COPD (chronic obstructive pulmonary disease) ([place]) 08/31/2017   Gout    History of pneumonia    HTN (hypertension)    Hyperlipidemia    Ischemic cardiomyopathy    a. 2D ECHO: EF 25-30%. Akinesis of the anteroseptal and   Left main coronary artery disease    Myocardial infarction ([place])    Obesity    S/P CABG x 2 09/02/2015   LIMA to LAD, SVG to ramus intermediate branch, EVH via right thigh    Shortness of breath dyspnea    Tobacco abuse    Urinary frequency     Past Surgical History:  Procedure Laterality Date   CARDIAC CATHETERIZATION     CARDIAC CATHETERIZATION N/A 06/14/2015   Procedure: Left Heart Cath and Coronary Angiography;  Surgeon: Lorretta Harp, MD;  Location: [facility] [facility];  Service: Cardiovascular;  Laterality: N/A;   CARDIAC CATHETERIZATION N/A 08/18/2015   Procedure: Intravascular Pressure Wire/FFR Study;  Surgeon: Ladean Steinmeyer M Martinique, MD;  Location: [place] [facility];  Service: Cardiovascular;  Laterality: N/A;   COLONOSCOPY     CORONARY ANGIOPLASTY  2000   CORONARY ANGIOPLASTY WITH STENT PLACEMENT  2015   CORONARY ARTERY BYPASS GRAFT N/A 09/02/2015   Procedure: CORONARY ARTERY BYPASS GRAFTING (CABG) x 2 (LIMA-LAD, SVG-Intermediate) ENDOSCOPIC GREATER SAPHENOUS VEIN HARVEST RIGHT THIGH;  Surgeon: Rexene Alberts, MD;  Location: [facility];  Service: Open Heart Surgery;  Laterality: N/A;   CORONARY BALLOON ANGIOPLASTY N/A 01/04/2021   Procedure: CORONARY BALLOON ANGIOPLASTY;  Surgeon: Jettie Booze, MD;  Location: [place] [facility];  Service: Cardiovascular;  Laterality: N/A;   FOOT SURGERY     IMPLANTABLE CARDIOVERTER DEFIBRILLATOR IMPLANT N/A 04/17/2014   Procedure: IMPLANTABLE CARDIOVERTER DEFIBRILLATOR IMPLANT;  Surgeon: Evans Lance, MD;  Location: [facility] CATH  LAB;  Service: Cardiovascular;  Laterality: N/A;   LEFT HEART CATH AND CORONARY ANGIOGRAPHY N/A 01/04/2021   Procedure: LEFT HEART CATH AND CORONARY ANGIOGRAPHY;  Surgeon: Jettie Booze, MD;  Location: [place] [facility];  Service: Cardiovascular;  Laterality: N/A;   LEFT HEART CATH AND CORS/GRAFTS ANGIOGRAPHY N/A 08/30/2017   Procedure: LEFT HEART CATH AND CORS/GRAFTS ANGIOGRAPHY;  Surgeon: Nelva Bush, MD;  Location: [place] [facility];  Service: Cardiovascular;  Laterality: N/A;   LEFT HEART CATHETERIZATION WITH CORONARY ANGIOGRAM N/A 12/11/2013   Procedure: LEFT HEART CATHETERIZATION WITH CORONARY ANGIOGRAM;  Surgeon: Quinta Eimer M Martinique, MD;  Location: [facility] C[facility] LAB;  Service: Cardiovascular;  Laterality: N/A;   LEFT HEART CATHETERIZATION WITH CORONARY ANGIOGRAM N/A 01/05/2015   Procedure: LEFT HEART CATHETERIZATION WITH CORONARY ANGIOGRAM;  Surgeon: Krissia Schreier M Martinique, MD;  Location: [facility] CATH LAB;  Service: Cardiovascular;  Laterality: N/A;   PERCUTANEOUS CORONARY STENT INTERVENTION (PCI-S)  12/11/2013   Procedure: PERCUTANEOUS CORONARY STENT INTERVENTION (PCI-S);  Surgeon: Sonoma Firkus M Martinique,  MD;  Location: [place] CATH LAB;  Service: Cardiovascular;;  prov LAD and mid LAD   PERCUTANEOUS CORONARY STENT INTERVENTION (PCI-S) N/A 02/03/2015   Procedure: PERCUTANEOUS CORONARY STENT INTERVENTION (PCI-S);  Surgeon: Jettie Booze, MD;  Location: [facility] CATH LAB;  Service: Cardiovascular;  Laterality: N/A;   TEE WITHOUT CARDIOVERSION N/A 09/02/2015   Procedure: TRANSESOPHAGEAL ECHOCARDIOGRAM (TEE);  Surgeon: Rexene Alberts, MD;  Location: [place];  Service: Open Heart Surgery;  Laterality: N/A;    Current Medications: Outpatient Medications Prior to Visit  Medication Sig Dispense Refill   acetaminophen (TYLENOL) 325 MG tablet Take 2 tablets (650 mg total) by mouth every 4 (four) hours as needed for headache or mild pain.     allopurinol (ZYLOPRIM) 300 MG tablet Take 1 tablet (300 mg total) by mouth daily. 90 tablet  3   apixaban (ELIQUIS) 5 MG TABS tablet Take 1 tablet (5 mg total) by mouth 2 (two) times daily. 180 tablet 3   aspirin EC 81 MG tablet Take 81 mg by mouth daily.     atorvastatin (LIPITOR) 80 MG tablet Take 1 tablet (80 mg total) by mouth daily. 90 tablet 3   carvedilol (COREG) 6.25 MG tablet Take 1 tablet (6.25 mg total) by mouth 2 (two) times daily. 180 tablet 3   cholecalciferol (VITAMIN D3) 25 MCG (1000 UNIT) tablet Take 1,000 Units by mouth daily.     ezetimibe (ZETIA) 10 MG tablet Take 1 tablet (10 mg total) by mouth daily. 90 tablet 3   furosemide (LASIX) 40 MG tablet Take 1 tablet (40 mg total) by mouth daily. 90 tablet 1   isosorbide mononitrate (IMDUR) 30 MG 24 hr tablet Take 1 tablet (30 mg total) by mouth daily. 90 tablet 3   lisinopril (ZESTRIL) 5 MG tablet Take 1 tablet (5 mg total) by mouth daily. 90 tablet 3   Magnesium Oxide 200 MG TABS Take 1 tablet (200 mg total) by mouth 2 (two) times daily. 60 tablet 0   nitroGLYCERIN (NITROSTAT) 0.4 MG SL tablet DISSOLVE ONE TABLET UNDER THE TONGUE EVERY 5 MINUTES AS NEEDED FOR CHEST PAIN.  DO NOT EXCEED A TOTAL OF 3 DOSES IN 15 MINUTES 25 tablet 2   ofloxacin (OCUFLOX) 0.3 % ophthalmic solution Place into the right eye.     prednisoLONE acetate (PRED FORTE) 1 % ophthalmic suspension Place into the right eye.     sildenafil (VIAGRA) 50 MG tablet Take 1 to 2 tablets as needed 10 tablet 3   spironolactone (ALDACTONE) 25 MG tablet Take 1 tablet (25 mg total) by mouth at bedtime. 90 tablet 3   varenicline (CHANTIX STARTING MONTH PAK) 0.5 MG X 11 & 1 MG X 42 tablet Take one 0.5 mg tablet by mouth once daily for 3 days, then increase to one 0.5 mg tablet twice daily for 4 days, then increase to one 1 mg tablet twice daily. 53 tablet 0   varenicline (CHANTIX CONTINUING MONTH PAK) 1 MG tablet Take 1 tablet (1 mg total) by mouth 2 (two) times daily. (Patient not taking: Reported on 11/11/2021) 60 tablet 1   Facility-Administered Medications Prior to  Visit  Medication Dose Route Frequency Provider Last Rate Last Admin   nicotine (NICODERM CQ - dosed in mg/24 hr) patch 14 mg  14 mg Transdermal Daily Duke, Tami Lin, PA         Allergies:   Patient has no known allergies.   Social History   Socioeconomic History   Marital status: Married  Spouse name: Not on file   Number of children: 2   Years of education: Not on file   Highest education level: Not on file  Occupational History   Occupation: [employer]  Tobacco Use   Smoking status: Every Day    Packs/day: 0.50    Years: 35.00    Pack years: 17.50    Types: Cigarettes   Smokeless tobacco: Never  Vaping Use   Vaping Use: Never used  Substance and Sexual Activity   Alcohol use: No    Alcohol/week: 0.0 standard drinks   Drug use: No   Sexual activity: Not Currently  Other Topics Concern   Not on file  Social History Narrative   Not on file   Social Determinants of Health   Financial Resource Strain: Not on file  Food Insecurity: Not on file  Transportation Needs: Not on file  Physical Activity: Not on file  Stress: Not on file  Social Connections: Not on file     Family History:  The patient's family history includes CAD in his father; Cancer in his mother.   ROS:   Please see the history of present illness.    ROS All other systems reviewed and are negative.   PHYSICAL EXAM:   VS:  BP 120/80 (BP Location: Left Arm)   Pulse 63   Ht 6\' 1"  (1.854 m)   Wt 230 lb 3.2 oz (104.4 kg)   SpO2 94%   BMI 30.37 kg/m    GENERAL:  Well appearing WM in NAD HEENT:  PERRL, EOMI, sclera are clear. Oropharynx is clear. NECK:  No jugular venous distention, carotid upstroke brisk and symmetric, no bruits, no thyromegaly or adenopathy LUNGS:  Clear to auscultation bilaterally CHEST:  Unremarkable HEART:  RRR,  PMI not displaced or sustained,S1 and S2 within normal limits, no S3, no S4: no clicks, no rubs, no murmurs ABD:  Soft, nontender. BS +, no  masses or bruits. No hepatomegaly, no splenomegaly EXT:  2 + pulses throughout, no edema, no cyanosis no clubbing SKIN:  Warm and dry.  No rashes NEURO:  Alert and oriented x 3. Cranial nerves II through XII intact. PSYCH:  Cognitively intact    Wt Readings from Last 3 Encounters:  11/11/21 230 lb 3.2 oz (104.4 kg)  11/07/21 232 lb 8 oz (105.5 kg)  02/10/21 227 lb 8 oz (103.2 kg)      Studies/Labs Reviewed:   EKG:  EKG is ordered today.  NSR with old anterior infarct. T wave inversion in the lateral leads. I have personally reviewed and interpreted this study.   Recent Labs: 01/18/2021: ALT 15; BUN 17; Creatinine, Ser 0.94; Hemoglobin 16.8; Magnesium 1.4; Platelets 318; Potassium 5.2; Sodium 139; TSH 2.080   Lipid Panel    Component Value Date/Time   CHOL 181 01/04/2021 0236   CHOL 187 03/18/2020 1522   TRIG 169 (H) 01/04/2021 0236   HDL 42 01/04/2021 0236   HDL 35 (L) 03/18/2020 1522   CHOLHDL 4.3 01/04/2021 0236   VLDL 34 01/04/2021 0236   LDLCALC 105 (H) 01/04/2021 0236   LDLCALC 118 (H) 03/18/2020 1522    Additional studies/ records that were reviewed today include:   Pulmonary tests PFT 10/03/2017>> FEV1 2.57 (65%) TLC, 6.93 (91%), DLCO 60% PFT 08/31/15 >> FEV1 3.04 (75%), FEV1% 67, TLC 7.98 (104%), DLCO 61% CT chest 08/31/17 >> severe CAD, TAA 4.1 cm, mod/severe centrilobular emphysema, multiple nodules 2-3 mm  Cath 08/30/2017 FINDINGS: Stable native coronary artery  disease with 50% distal LMCA and chronic total occlusion of ostial RCA. Distal RCA fills via left-to-right collaterals. Widely patent LIMA to LAD and SVG to ramus. Multiple abnormal vessels arising from the LIMA graft. Mediastinal mass is a consideration. Normal left ventricular filling pressure. Moderate to severely reduced left ventricular contraction.   RECOMMENDATIONS: Recommend CT chest to evaluate for intrathoracic mass. Continue medical therapy and secondary prevention of CAD.  Cath:  01/04/21   LM lesion is 50% stenosed. Patent LIMA to LAD. Patent SVG to ramus. 2nd Diag lesion is 70% stenosed. Mid RCA to Dist RCA lesion is 100% stenosed. Prox RCA to Mid RCA lesion is 100% stenosed. Ost RCA to Prox RCA lesion is 95% stenosed. Ost Cx lesion is 80% stenosed. This territory is not bypassed, but it is a relatively small vessel. Unable to advance balloon after successfully guiding the wire though the ramus graft and retrograde to the ostial circumflex and across the lesion. The left ventricular ejection fraction is 25-35% by visual estimate. There is moderate to severe left ventricular systolic dysfunction. LV end diastolic pressure is normal. There is no aortic valve stenosis.   Medical therapy for CAD.  Unsuccessful attempt at PCI of ostial circumflex.  Restart IV heparin 8 hours post sheath pull.  OK to start [place] tomorrow for atrial fibrillation.  He was loaded with Plavix but will not continue if he is starting a DOAC.  If he is not starting a DOAC, DAPT would be reasonable for his CAD.     Diagnostic Dominance: Right     Echo: 01/04/21   IMPRESSIONS     1. There is no left ventricular thrombus (Definity contrast was used).  There is apical dyskinesis. There is severe inferior wall hypokinesis and  inferoseptal akinesis.. Left ventricular ejection fraction, by estimation,  is 25 to 30%. The left ventricle  has severely decreased function. The left ventricle demonstrates regional  wall motion abnormalities (see scoring diagram/findings for description).  The left ventricular internal cavity size was moderately to severely  dilated. Left ventricular diastolic  parameters are consistent with Grade I diastolic dysfunction (impaired  relaxation).   2. Right ventricular systolic function is normal. The right ventricular  size is normal. There is normal pulmonary artery systolic pressure. The  estimated right ventricular systolic pressure is 06.2 mmHg.   3. Left atrial  size was severely dilated.   4. The mitral valve is normal in structure. Trivial mitral valve  regurgitation.   5. The aortic valve is tricuspid. There is mild calcification of the  aortic valve. There is mild thickening of the aortic valve. Aortic valve  regurgitation is not visualized. Mild aortic valve sclerosis is present,  with no evidence of aortic valve  stenosis.   6. Aortic dilatation noted. There is mild dilatation of the aortic root,  measuring 39 mm.   7. The inferior vena cava is normal in size with greater than 50%  respiratory variability, suggesting right atrial pressure of 3 mmHg.    ASSESSMENT:    1. Coronary artery disease involving native coronary artery of native heart with angina pectoris ([place])   2. Chronic systolic heart failure ([place])   3. S/P CABG x 2   4. ICD (implantable cardioverter-defibrillator) in place      PLAN:   1. CAD s/p CABG x 2, multiple PCI. Patent LIMA to the LAD, SVG to ramus CTO of RCA complicated by perforation, tamponade, and pericarditis Unsuccessful PCI of Cx - 01/04/21- I personally reviewed  films. The angulation into the LCx is not favorable for PCI and it looks like the LCx is adequately supplied by the SVG to ramus - he has no further chest pain - continue ASA - continue imdur     2. Paroxysmal atrial fibrillation - started on eliquis 5 mg BID- he is having difficulty affording Eliquis. Will fill out patient assistance forms. If still unable to afford may have to switch to Coumadin. This patients CHA2DS2-VASc Score and unadjusted Ischemic Stroke Rate (% per year) is equal to 3.2 % stroke rate/year from a score of 3 (CHF, CAD, HTN)     3.  Chronic systolic and diastolic heart failure - echo with EF 30-35% - recommendations to transition to entresto - PA was submitted prior to discharge but this was denied- unclear why. - will continue lisinopril and lasix, on Coreg - Denied Entresto by insurance.      5. Hypertension -  continue coreg, imdur, lisinopril, lasix, and spiro     6. Hyperlipidemia with LDL goal < 70 01/04/2021: Cholesterol 181; HDL 42; LDL Cholesterol 105; Triglycerides 169; VLDL 34 - continue statin - zetia was added at discharge - repeat lipids in 2 weeks. If still not at goal will refer to lipid clinic     7. Smoker - he is interested in cessation - failed Chantix. - will try Wellbutrin 150 mg daily.   8. AAA. 5.1 cm. Followed by [facility]. Also with claudication. FU dopplers.     Medication Adjustments/Labs and Tests Ordered: Current medicines are reviewed at length with the patient today.  Concerns regarding medicines are outlined above.  Medication changes, Labs and Tests ordered today are listed in the Patient Instructions below. Patient Instructions  Medication Instructions:  Start Wellbutrin 150 mg daily Continue all other medications  *If you need a refill on your cardiac medications before your next appointment, please call your pharmacy*   Lab Work: None ordered   Testing/Procedures: None ordered   Follow-Up: At [facility], you and your health needs are our priority.  As part of our continuing mission to provide you with exceptional heart care, we have created designated Provider Care Teams.  These Care Teams include your primary Cardiologist (physician) and Advanced Practice Providers (APPs -  Physician Assistants and Nurse Practitioners) who all work together to provide you with the care you need, when you need it.  We recommend signing up for the patient portal called "MyChart".  Sign up information is provided on this After Visit Summary.  MyChart is used to connect with patients for Virtual Visits (Telemedicine).  Patients are able to view lab/test results, encounter notes, upcoming appointments, etc.  Non-urgent messages can be sent to your provider as well.   To learn more about what you can do with MyChart, go to [email or web address].      Your next  appointment:  6 months    The format for your next appointment: Office     Provider:  Dr.Alyria Krack     Signed, Alvita Fana Martinique, MD  11/11/2021 5:01 PM    [facility] [address], [place], [place]  [address] Phone: [phone]; Fax: [phone]

## 2021-11-09 ENCOUNTER — Other Ambulatory Visit: Payer: Self-pay | Admitting: *Deleted

## 2021-11-09 DIAGNOSIS — I7121 Aneurysm of the ascending aorta, without rupture: Secondary | ICD-10-CM

## 2021-11-11 ENCOUNTER — Other Ambulatory Visit: Payer: Self-pay | Admitting: Cardiology

## 2021-11-11 ENCOUNTER — Encounter: Payer: Self-pay | Admitting: Cardiology

## 2021-11-11 ENCOUNTER — Ambulatory Visit (INDEPENDENT_AMBULATORY_CARE_PROVIDER_SITE_OTHER): Payer: Medicare Other | Admitting: Cardiology

## 2021-11-11 ENCOUNTER — Other Ambulatory Visit: Payer: Self-pay

## 2021-11-11 VITALS — BP 120/80 | HR 63 | Ht 73.0 in | Wt 230.2 lb

## 2021-11-11 DIAGNOSIS — I5022 Chronic systolic (congestive) heart failure: Secondary | ICD-10-CM

## 2021-11-11 DIAGNOSIS — Z951 Presence of aortocoronary bypass graft: Secondary | ICD-10-CM

## 2021-11-11 DIAGNOSIS — I255 Ischemic cardiomyopathy: Secondary | ICD-10-CM

## 2021-11-11 DIAGNOSIS — Z95810 Presence of automatic (implantable) cardiac defibrillator: Secondary | ICD-10-CM

## 2021-11-11 DIAGNOSIS — I25119 Atherosclerotic heart disease of native coronary artery with unspecified angina pectoris: Secondary | ICD-10-CM | POA: Diagnosis not present

## 2021-11-11 MED ORDER — SILDENAFIL CITRATE 50 MG PO TABS: 50.0000 mg | ORAL_TABLET | Freq: Every day | ORAL | 3 refills | Status: DC | PRN

## 2021-11-11 MED ORDER — BUPROPION HCL ER (XL) 150 MG PO TB24: 150.0000 mg | ORAL_TABLET | Freq: Every day | ORAL | 0 refills | Status: DC

## 2021-11-11 NOTE — Patient Instructions (Signed)
Medication Instructions:  Start Wellbutrin 150 mg daily Continue all other medications  *If you need a refill on your cardiac medications before your next appointment, please call your pharmacy*   Lab Work: None ordered   Testing/Procedures: None ordered   Follow-Up: At [facility], you and your health needs are our priority.  As part of our continuing mission to provide you with exceptional heart care, we have created designated Provider Care Teams.  These Care Teams include your primary Cardiologist (physician) and Advanced Practice Providers (APPs -  Physician Assistants and Nurse Practitioners) who all work together to provide you with the care you need, when you need it.  We recommend signing up for the patient portal called "MyChart".  Sign up information is provided on this After Visit Summary.  MyChart is used to connect with patients for Virtual Visits (Telemedicine).  Patients are able to view lab/test results, encounter notes, upcoming appointments, etc.  Non-urgent messages can be sent to your provider as well.   To learn more about what you can do with MyChart, go to [email or web address].      Your next appointment:  6 months    The format for your next appointment: Office     Provider:  Dr.Jordan

## 2021-11-14 ENCOUNTER — Other Ambulatory Visit: Payer: Self-pay | Admitting: Cardiology

## 2021-11-15 ENCOUNTER — Other Ambulatory Visit: Payer: Self-pay

## 2021-11-24 ENCOUNTER — Emergency Department (HOSPITAL_COMMUNITY): Payer: Medicare Other

## 2021-11-24 ENCOUNTER — Emergency Department (HOSPITAL_COMMUNITY)
Admission: EM | Admit: 2021-11-24 | Discharge: 2021-11-24 | Disposition: A | Discharge status: Home / Self Care | Payer: Medicare Other | Attending: Emergency Medicine | Admitting: Emergency Medicine

## 2021-11-24 ENCOUNTER — Other Ambulatory Visit: Payer: Self-pay

## 2021-11-24 ENCOUNTER — Other Ambulatory Visit: Payer: Self-pay | Admitting: Cardiology

## 2021-11-24 DIAGNOSIS — Z7982 Long term (current) use of aspirin: Secondary | ICD-10-CM | POA: Diagnosis not present

## 2021-11-24 DIAGNOSIS — F17210 Nicotine dependence, cigarettes, uncomplicated: Secondary | ICD-10-CM | POA: Insufficient documentation

## 2021-11-24 DIAGNOSIS — Z7951 Long term (current) use of inhaled steroids: Secondary | ICD-10-CM | POA: Diagnosis not present

## 2021-11-24 DIAGNOSIS — S99922A Unspecified injury of left foot, initial encounter: Secondary | ICD-10-CM | POA: Insufficient documentation

## 2021-11-24 DIAGNOSIS — M25572 Pain in left ankle and joints of left foot: Secondary | ICD-10-CM | POA: Diagnosis not present

## 2021-11-24 DIAGNOSIS — Z85828 Personal history of other malignant neoplasm of skin: Secondary | ICD-10-CM | POA: Diagnosis not present

## 2021-11-24 DIAGNOSIS — H532 Diplopia: Secondary | ICD-10-CM | POA: Diagnosis not present

## 2021-11-24 DIAGNOSIS — Z7901 Long term (current) use of anticoagulants: Secondary | ICD-10-CM | POA: Diagnosis not present

## 2021-11-24 DIAGNOSIS — Z95810 Presence of automatic (implantable) cardiac defibrillator: Secondary | ICD-10-CM | POA: Insufficient documentation

## 2021-11-24 DIAGNOSIS — Z955 Presence of coronary angioplasty implant and graft: Secondary | ICD-10-CM | POA: Diagnosis not present

## 2021-11-24 DIAGNOSIS — J449 Chronic obstructive pulmonary disease, unspecified: Secondary | ICD-10-CM | POA: Diagnosis not present

## 2021-11-24 DIAGNOSIS — Z79899 Other long term (current) drug therapy: Secondary | ICD-10-CM | POA: Diagnosis not present

## 2021-11-24 DIAGNOSIS — R42 Dizziness and giddiness: Secondary | ICD-10-CM | POA: Insufficient documentation

## 2021-11-24 DIAGNOSIS — R531 Weakness: Secondary | ICD-10-CM | POA: Diagnosis not present

## 2021-11-24 DIAGNOSIS — I5021 Acute systolic (congestive) heart failure: Secondary | ICD-10-CM | POA: Diagnosis not present

## 2021-11-24 DIAGNOSIS — W19XXXA Unspecified fall, initial encounter: Secondary | ICD-10-CM | POA: Insufficient documentation

## 2021-11-24 DIAGNOSIS — I110 Hypertensive heart disease with heart failure: Secondary | ICD-10-CM | POA: Diagnosis not present

## 2021-11-24 DIAGNOSIS — I25119 Atherosclerotic heart disease of native coronary artery with unspecified angina pectoris: Secondary | ICD-10-CM | POA: Diagnosis not present

## 2021-11-24 LAB — CBC
HCT: 44.5 % (ref 39.0–52.0)
Hemoglobin: 15.1 g/dL (ref 13.0–17.0)
MCH: 30.4 pg (ref 26.0–34.0)
MCHC: 33.9 g/dL (ref 30.0–36.0)
MCV: 89.5 fL (ref 80.0–100.0)
Platelets: 259 10*3/uL (ref 150–400)
RBC: 4.97 MIL/uL (ref 4.22–5.81)
RDW: 13.4 % (ref 11.5–15.5)
WBC: 11.9 10*3/uL — ABNORMAL HIGH (ref 4.0–10.5)
nRBC: 0 % (ref 0.0–0.2)

## 2021-11-24 LAB — BASIC METABOLIC PANEL
Anion gap: 7 (ref 5–15)
BUN: 15 mg/dL (ref 8–23)
CO2: 25 mmol/L (ref 22–32)
Calcium: 8.8 mg/dL — ABNORMAL LOW (ref 8.9–10.3)
Chloride: 104 mmol/L (ref 98–111)
Creatinine, Ser: 1.06 mg/dL (ref 0.61–1.24)
GFR, Estimated: >60 mL/min (ref >60)
Glucose, Bld: 144 mg/dL — ABNORMAL HIGH (ref 70–99)
Potassium: 4 mmol/L (ref 3.5–5.1)
Sodium: 136 mmol/L (ref 135–145)

## 2021-11-24 LAB — URINALYSIS, ROUTINE W REFLEX MICROSCOPIC
Bilirubin Urine: NEGATIVE
Glucose, UA: NEGATIVE mg/dL
Hgb urine dipstick: NEGATIVE
Ketones, ur: NEGATIVE mg/dL
Leukocytes,Ua: NEGATIVE
Nitrite: NEGATIVE
Protein, ur: NEGATIVE mg/dL
Specific Gravity, Urine: 1.02 (ref 1.005–1.030)
pH: 5 (ref 5.0–8.0)

## 2021-11-24 NOTE — ED Notes (Signed)
Patient transported to X-ray 

## 2021-11-24 NOTE — ED Notes (Signed)
Pt has 1+ swelling of left ankle, 2+ left pedal pulse, cap refill less than 3 sec, pt able to wiggle toes, sensation intact.

## 2021-11-24 NOTE — Discharge Instructions (Signed)
You have been seen and discharged from the emergency department.  Your blood work is reassuring.  EKG was normal.  MRI of the brain showed no signs of stroke.  X-ray did not show fracture.  Rest, elevate and ice the foot.  You most likely suffered a mild sprain.  In regards to the dizziness that sounds vertiginous, follow-up with ENT.  Follow-up with your primary provider for reevaluation and further care. Take home medications as prescribed. If you have any worsening symptoms or further concerns for your health please return to an emergency department for further evaluation.

## 2021-11-24 NOTE — ED Provider Notes (Signed)
[facility] EMERGENCY DEPARTMENT Provider Note   CSN: [id number] Arrival date & time: 11/24/21  7782     History Chief Complaint  Patient presents with   Dizziness    Kerry Mason is a 65 y.o. male.  HPI  65 year old male with past medical history of recent cataract surgery, HTN, HLD, CAD status post MI and CABG, COPD, ascending aortic aneurysm that is being monitored as an outpatient presents the emergency department with left ankle/foot pain stemming from a fall.  Patient states for the last 2 weeks he has been having intermittent dizziness.  He describes it as sudden onset, associated with position change or head turning, room spinning sensation.  Self resolved, goes back to baseline.  Currently he has no active dizziness.  Denies any chest pain or palpitations.  Patient recently had cataract surgery and since the surgery has been noticing horizontal double vision at distance.  He also feels like he has intermittent weakness involving the left calf.  Patient states this morning when he got up he had dizziness with sudden position change and was stepping at the same time and rolled his left ankle.  He has now having pain along the lateral side of the left ankle.  He denies any facial droop, speech change, focal ongoing weakness/numbness.  Past Medical History:  Diagnosis Date   Acute systolic CHF (congestive heart failure) (HCC)    AICD (automatic cardioverter/defibrillator) present    Anxiety    Ascending aortic aneurysm 08/31/2017   Back pain 08/31/2017   Basal cell carcinoma    left arm   CAD (coronary artery disease)    a. 2000: s/p stent of RCA 2000 with BMS  b. 2015: STEMI s/p LHC with old occlusion of RCA and DESx2 to LAD  c. 03/05/34: complicated PCI on 3/2 for CTO of mid RCA with coronary perforation and cardiac tamponade requiring emergent pericardiocentesis      Cardiac tamponade    a. 02/03/15 2/2 coronary perforation during CTO procedure. Sealed with  graftmaster coated stent.    COPD (chronic obstructive pulmonary disease) ([place]) 08/31/2017   Gout    History of pneumonia    HTN (hypertension)    Hyperlipidemia    Ischemic cardiomyopathy    a. 2D ECHO: EF 25-30%. Akinesis of the anteroseptal and   Left main coronary artery disease    Myocardial infarction ([place])    Obesity    S/P CABG x 2 09/02/2015   LIMA to LAD, SVG to ramus intermediate branch, EVH via right thigh    Shortness of breath dyspnea    Tobacco abuse    Urinary frequency     Patient Active Problem List   Diagnosis Date Noted   Chronic anticoagulation 01/18/2021   Urinary frequency    Obesity    Myocardial infarction [facility])    Ischemic cardiomyopathy    History of pneumonia    Cardiac tamponade    CAD (coronary artery disease)    Basal cell carcinoma    AICD (automatic cardioverter/defibrillator) present    Acute systolic CHF (congestive heart failure) ([place])    Back pain 08/31/2017   Ascending aortic aneurysm 08/31/2017   COPD (chronic obstructive pulmonary disease) ([place]) 08/31/2017   Coronary artery disease involving native coronary artery of native heart with angina pectoris (HCC)    S/P CABG x 2 09/02/2015   CAD, multiple vessel 08/23/2015   Left main coronary artery disease    Unstable angina (HCC)    NSTEMI (non-ST elevated  myocardial infarction) ([place])    Chest pain 06/13/2015   PAF- Amiodarone stopped, holding NSR 02/18/2015   ISR RCA DES- plan medical Rx 02/08/2015   CAD S/P multiple PCI- last March 4287 to RCA complicated by tamponade    Anxiety    Gout    Pericardial effusion    ICD in place 68/10/5725   Chronic systolic CHF (congestive heart failure), NYHA class 3 ([place]) 12/14/2013   Cardiomyopathy, ischemic-EF 25% 12/14/2013   HTN (hypertension) 12/14/2013   Hyperlipidemia 12/14/2013   Tobacco abuse 12/11/2013    Past Surgical History:  Procedure Laterality Date   CARDIAC CATHETERIZATION     CARDIAC CATHETERIZATION N/A 06/14/2015    Procedure: Left Heart Cath and Coronary Angiography;  Surgeon: Lorretta Harp, MD;  Location: [facility] CV LAB;  Service: Cardiovascular;  Laterality: N/A;   CARDIAC CATHETERIZATION N/A 08/18/2015   Procedure: Intravascular Pressure Wire/FFR Study;  Surgeon: Peter M Martinique, MD;  Location: [place] CV LAB;  Service: Cardiovascular;  Laterality: N/A;   COLONOSCOPY     CORONARY ANGIOPLASTY  2000   CORONARY ANGIOPLASTY WITH STENT PLACEMENT  2015   CORONARY ARTERY BYPASS GRAFT N/A 09/02/2015   Procedure: CORONARY ARTERY BYPASS GRAFTING (CABG) x 2 (LIMA-LAD, SVG-Intermediate) ENDOSCOPIC GREATER SAPHENOUS VEIN HARVEST RIGHT THIGH;  Surgeon: Rexene Alberts, MD;  Location: [facility];  Service: Open Heart Surgery;  Laterality: N/A;   CORONARY BALLOON ANGIOPLASTY N/A 01/04/2021   Procedure: CORONARY BALLOON ANGIOPLASTY;  Surgeon: Jettie Booze, MD;  Location: [facility] CV LAB;  Service: Cardiovascular;  Laterality: N/A;   FOOT SURGERY     IMPLANTABLE CARDIOVERTER DEFIBRILLATOR IMPLANT N/A 04/17/2014   Procedure: IMPLANTABLE CARDIOVERTER DEFIBRILLATOR IMPLANT;  Surgeon: Evans Lance, MD;  Location[facility] CATH LAB;  Service: Cardiovascular;  Laterality: N/A;   LEFT HEART CATH AND CORONARY ANGIOGRAPHY N/A 01/04/2021   Procedure: LEFT HEART CATH AND CORONARY ANGIOGRAPHY;  Surgeon: Jettie Booze, MD;  Location: [place] CV LAB;  Service: Cardiovascular;  Laterality: N/A;   LEFT HEART CATH AND CORS/GRAFTS ANGIOGRAPHY N/A 08/30/2017   Procedure: LEFT HEART CATH AND CORS/GRAFTS ANGIOGRAPHY;  Surgeon: Nelva Bush, MD;  Location: [facility] CV LAB;  Service: Cardiovascular;  Laterality: N/A;   LEFT HEART CATHETERIZATION WITH CORONARY ANGIOGRAM N/A 12/11/2013   Procedure: LEFT HEART CATHETERIZATION WITH CORONARY ANGIOGRAM;  Surgeon: Peter M Martinique, MD;  Location: [facility] CATH LAB;  Service: Cardiovascular;  Laterality: N/A;   LEFT HEART CATHETERIZATION WITH CORONARY ANGIOGRAM N/A 01/05/2015   Procedure:  LEFT HEART CATHETERIZATION WITH CORONARY ANGIOGRAM;  Surgeon: Peter M Martinique, MD;  Location: [facility] CATH LAB;  Service: Cardiovascular;  Laterality: N/A;   PERCUTANEOUS CORONARY STENT INTERVENTION (PCI-S)  12/11/2013   Procedure: PERCUTANEOUS CORONARY STENT INTERVENTION (PCI-S);  Surgeon: Peter M Martinique, MD;  Location: [facility] CATH LAB;  Service: Cardiovascular;;  prov LAD and mid LAD   PERCUTANEOUS CORONARY STENT INTERVENTION (PCI-S) N/A 02/03/2015   Procedure: PERCUTANEOUS CORONARY STENT INTERVENTION (PCI-S);  Surgeon: Jettie Booze, MD;  Location: [facility] CATH LAB;  Service: Cardiovascular;  Laterality: N/A;   TEE WITHOUT CARDIOVERSION N/A 09/02/2015   Procedure: TRANSESOPHAGEAL ECHOCARDIOGRAM (TEE);  Surgeon: Rexene Alberts, MD;  Location: [facility];  Service: Open Heart Surgery;  Laterality: N/A;       Family History  Problem Relation Age of Onset   CAD Father        PTCA   Cancer Mother        LYMPHOMA    Social History  Tobacco Use   Smoking status: Every Day    Packs/day: 0.50    Years: 35.00    Pack years: 17.50    Types: Cigarettes   Smokeless tobacco: Never  Vaping Use   Vaping Use: Never used  Substance Use Topics   Alcohol use: No    Alcohol/week: 0.0 standard drinks   Drug use: No    Home Medications Prior to Admission medications   Medication Sig Start Date End Date Taking? Authorizing Provider  acetaminophen (TYLENOL) 325 MG tablet Take 2 tablets (650 mg total) by mouth every 4 (four) hours as needed for headache or mild pain. 06/16/15  Yes Kilroy, Luke K, PA-C  allopurinol (ZYLOPRIM) 300 MG tablet Take 1 tablet (300 mg total) by mouth daily. 11/03/21  Yes Martinique, Peter M, MD  apixaban (ELIQUIS) 5 MG TABS tablet Take 1 tablet (5 mg total) by mouth 2 (two) times daily. 11/07/21  Yes Martinique, Peter M, MD  aspirin EC 81 MG tablet Take 81 mg by mouth daily.   Yes [provider]  atorvastatin (LIPITOR) 80 MG tablet Take 1 tablet (80 mg total) by mouth daily. 11/03/21  05/02/22 Yes Martinique, Peter M, MD  buPROPion (WELLBUTRIN XL) 150 MG 24 hr tablet Take 1 tablet (150 mg total) by mouth daily. 11/11/21  Yes Martinique, Peter M, MD  carvedilol (COREG) 6.25 MG tablet Take 1 tablet (6.25 mg total) by mouth 2 (two) times daily. 11/03/21  Yes Martinique, Peter M, MD  cholecalciferol (VITAMIN D3) 25 MCG (1000 UNIT) tablet Take 1,000 Units by mouth daily.   Yes Duke, Tami Lin, PA  ezetimibe (ZETIA) 10 MG tablet Take 1 tablet (10 mg total) by mouth daily. 11/03/21  Yes Martinique, Peter M, MD  furosemide (LASIX) 40 MG tablet Take 1 tablet (40 mg total) by mouth daily. 11/03/21  Yes Martinique, Peter M, MD  isosorbide mononitrate (IMDUR) 30 MG 24 hr tablet Take 1 tablet (30 mg total) by mouth daily. 11/03/21  Yes Martinique, Peter M, MD  lisinopril (ZESTRIL) 5 MG tablet Take 1 tablet (5 mg total) by mouth daily. 11/03/21 02/01/22 Yes Martinique, Peter M, MD  nitroGLYCERIN (NITROSTAT) 0.4 MG SL tablet DISSOLVE ONE TABLET UNDER THE TONGUE EVERY 5 MINUTES AS NEEDED FOR CHEST PAIN.  DO NOT EXCEED A TOTAL OF 3 DOSES IN 15 MINUTES Patient taking differently: Place 0.4 mg under the tongue See admin instructions. DISSOLVE ONE TABLET UNDER THE TONGUE EVERY 5 MINUTES AS NEEDED FOR CHEST PAIN.  DO NOT EXCEED A TOTAL OF 3 DOSES IN 15 MINUTES 01/05/21  Yes Reino Bellis B, NP  ofloxacin (OCUFLOX) 0.3 % ophthalmic solution Place 1 drop into both eyes in the morning and at bedtime. 10/31/21  Yes [provider]  prednisoLONE acetate (PRED FORTE) 1 % ophthalmic suspension Place 1 drop into the right eye 2 (two) times daily. 10/31/21  Yes [provider]  sildenafil (VIAGRA) 50 MG tablet TAKE 1 TABLET BY MOUTH DAILY AS NEEDED FOR ERECTILE DYSFUNCTION Patient taking differently: Take 50 mg by mouth as needed for erectile dysfunction. 11/14/21  Yes Martinique, Peter M, MD  spironolactone (ALDACTONE) 25 MG tablet Take 1 tablet (25 mg total) by mouth at bedtime. 11/03/21  Yes Martinique, Peter M, MD  Magnesium  Oxide 200 MG TABS Take 1 tablet (200 mg total) by mouth 2 (two) times daily. Patient not taking: Reported on 11/24/2021 01/21/21   Ledora Bottcher, PA  budesonide-formoterol [facility]) 160-4.5 MCG/ACT inhaler Inhale 2 puffs into the  lungs 2 (two) times daily. Patient not taking: Reported on 06/21/2020 09/05/17 06/21/20  Chesley Mires, MD  tiotropium (SPIRIVA) 18 MCG inhalation capsule Place 1 capsule (18 mcg total) into inhaler and inhale daily. Patient not taking: Reported on 06/21/2020 09/01/17 06/21/20  Isaiah Serge, NP    Allergies    Patient has no known allergies.  Review of Systems   Review of Systems  Constitutional:  Positive for fatigue. Negative for chills and fever.  HENT:  Negative for congestion.   Eyes:  Positive for visual disturbance.  Respiratory:  Negative for shortness of breath.   Cardiovascular:  Negative for chest pain.  Gastrointestinal:  Negative for abdominal pain, diarrhea and vomiting.  Genitourinary:  Negative for dysuria.  Musculoskeletal:  Negative for back pain and neck pain.  Skin:  Negative for rash.  Neurological:  Positive for dizziness and headaches.   Physical Exam Updated Vital Signs BP (!) 94/54    Pulse (!) 57    Temp 98.4 F (36.9 C) (Oral)    Resp 15    SpO2 94%   Physical Exam Vitals and nursing note reviewed.  Constitutional:      General: He is not in acute distress.    Appearance: Normal appearance.  HENT:     Head: Normocephalic.     Mouth/Throat:     Mouth: Mucous membranes are moist.  Eyes:     Extraocular Movements: Extraocular movements intact.     Pupils: Pupils are equal, round, and reactive to light.  Cardiovascular:     Rate and Rhythm: Normal rate.  Pulmonary:     Effort: Pulmonary effort is normal. No respiratory distress.  Abdominal:     Palpations: Abdomen is soft.     Tenderness: There is no abdominal tenderness.  Musculoskeletal:     Cervical back: No rigidity.     Comments: Tenderness to palpation along  the fifth meta tarsal, no obvious deformity, foot is neurovascularly intact  Skin:    General: Skin is warm.  Neurological:     General: No focal deficit present.     Mental Status: He is alert and oriented to person, place, and time. Mental status is at baseline.     Cranial Nerves: No cranial nerve deficit.  Psychiatric:        Mood and Affect: Mood normal.    ED Results / Procedures / Treatments   Labs (all labs ordered are listed, but only abnormal results are displayed) Labs Reviewed  BASIC METABOLIC PANEL - Abnormal; Notable for the following components:      Result Value   Glucose, Bld 144 (*)    Calcium 8.8 (*)    All other components within normal limits  CBC - Abnormal; Notable for the following components:   WBC 11.9 (*)    All other components within normal limits  URINALYSIS, ROUTINE W REFLEX MICROSCOPIC  CBG MONITORING, ED    EKG EKG Interpretation  Date/Time:  Thursday November 24 2021 08:58:19 EST Ventricular Rate:  71 PR Interval:  154 QRS Duration: 102 QT Interval:  366 QTC Calculation: 397 R Axis:   94 Text Interpretation: Normal sinus rhythm Rightward axis Anteroseptal infarct , age undetermined Abnormal ECG Similar to previous Confirmed by Lavenia Atlas [phone]) on 11/24/2021 10:54:37 AM  Radiology DG Foot Complete Left  Result Date: 11/24/2021 CLINICAL DATA:  Inversion injury, pain of the fifth metatarsal EXAM: LEFT FOOT - COMPLETE 3+ VIEW COMPARISON:  None. FINDINGS: There is no evidence of fracture or  dislocation. There is no evidence of arthropathy or other focal bone abnormality. Soft tissues are unremarkable. IMPRESSION: No fracture or dislocation of the left foot with specific attention to the fifth metatarsal. Electronically Signed   By: Delanna Ahmadi M.D.   On: 11/24/2021 11:21    Procedures Procedures   Medications Ordered in ED Medications - No data to display  ED Course  I have reviewed the triage vital signs and the nursing  notes.  Pertinent labs & imaging results that were available during my care of the patient were reviewed by me and considered in my medical decision making (see chart for details).    MDM Rules/Calculators/A&P                           65 year old male presents the emergency department with fall, left foot injury, intermittent dizziness and double vision at distance.  Recent cataract surgery.  Vital signs are stable on arrival, he appears neuro intact, normal visual fields.  No facial droop or other acute neurodeficit.  No dizziness at this time.  No cardiac symptoms.  It is possible that since the cataract surgery patient's vision is improved that he is just now noticing distant double vision that may not be acute however when combined with the dizziness and focal weakness of the left leg that is resolved we will pursue MRI to rule out CVA.  Patient also endorses tinnitus and possible decreased hearing specifically in the right ear, if MRI is negative we will plan for outpatient ENT evaluation in regards to vertigo/tinnitus.  X-ray shows no acute fracture, most likely mild sprain of the left foot.  MRI shows no findings of stroke.  I think the patient is most suffering from vertigo and a primary eye issue that is being followed with ophthalmology as an outpatient.  He has been ambulatory, feels well.  No need for further emergent evaluation/admission.  Patient at this time appears safe and stable for discharge and will be treated as an outpatient.  Discharge plan and strict return to ED precautions discussed, patient verbalizes understanding and agreement.     Final Clinical Impression(s) / ED Diagnoses Final diagnoses:  None    Rx / DC Orders ED Discharge Orders     None        Lorelle Gibbs, DO 11/24/21 1625

## 2021-11-24 NOTE — ED Triage Notes (Signed)
Pt. Stated, Kerry Mason been having dizziness for the last week, and it might be because Ive had some cataract surgery , but yesterday I fell when I had a dizzy spell and might have broke my foot. It hurts on the ankle and otter part of my foot.

## 2021-11-24 NOTE — ED Notes (Signed)
Pt is still in MRI at this time.  

## 2021-11-25 ENCOUNTER — Telehealth (HOSPITAL_BASED_OUTPATIENT_CLINIC_OR_DEPARTMENT_OTHER): Payer: Self-pay | Admitting: Emergency Medicine

## 2021-11-25 MED ORDER — MECLIZINE HCL 25 MG PO TABS: 25.0000 mg | ORAL_TABLET | Freq: Three times a day (TID) | ORAL | 0 refills | Status: DC | PRN

## 2021-11-25 NOTE — Telephone Encounter (Signed)
Patient with vertigo symptoms. Prescription for meclizine did not successfully go through. Will resend today.

## 2021-12-13 ENCOUNTER — Other Ambulatory Visit: Payer: Self-pay | Admitting: Cardiology

## 2021-12-14 ENCOUNTER — Other Ambulatory Visit: Payer: Self-pay

## 2021-12-14 ENCOUNTER — Ambulatory Visit (INDEPENDENT_AMBULATORY_CARE_PROVIDER_SITE_OTHER): Payer: Medicare Other | Admitting: Nurse Practitioner

## 2021-12-14 ENCOUNTER — Encounter: Payer: Self-pay | Admitting: Nurse Practitioner

## 2021-12-14 VITALS — BP 113/73 | HR 62 | Temp 98.4°F | Ht 72.0 in | Wt 227.9 lb

## 2021-12-14 DIAGNOSIS — I2510 Atherosclerotic heart disease of native coronary artery without angina pectoris: Secondary | ICD-10-CM | POA: Diagnosis not present

## 2021-12-14 DIAGNOSIS — R5383 Other fatigue: Secondary | ICD-10-CM | POA: Insufficient documentation

## 2021-12-14 DIAGNOSIS — R42 Dizziness and giddiness: Secondary | ICD-10-CM | POA: Insufficient documentation

## 2021-12-14 DIAGNOSIS — R7301 Impaired fasting glucose: Secondary | ICD-10-CM

## 2021-12-14 DIAGNOSIS — Z125 Encounter for screening for malignant neoplasm of prostate: Secondary | ICD-10-CM

## 2021-12-14 DIAGNOSIS — J449 Chronic obstructive pulmonary disease, unspecified: Secondary | ICD-10-CM | POA: Diagnosis not present

## 2021-12-14 DIAGNOSIS — Z1211 Encounter for screening for malignant neoplasm of colon: Secondary | ICD-10-CM

## 2021-12-14 DIAGNOSIS — Z0000 Encounter for general adult medical examination without abnormal findings: Secondary | ICD-10-CM

## 2021-12-14 DIAGNOSIS — I2583 Coronary atherosclerosis due to lipid rich plaque: Secondary | ICD-10-CM

## 2021-12-14 DIAGNOSIS — Z7689 Persons encountering health services in other specified circumstances: Secondary | ICD-10-CM

## 2021-12-14 MED ORDER — BUDESONIDE-FORMOTEROL FUMARATE 160-4.5 MCG/ACT IN AERO: 2.0000 | INHALATION_SPRAY | Freq: Two times a day (BID) | RESPIRATORY_TRACT | 6 refills | Status: DC

## 2021-12-14 NOTE — Progress Notes (Signed)
New Patient Office Visit  Subjective:  Patient ID: Kerry Mason, male    DOB: [birth year]  Age: 66 y.o. MRN: [id number]  CC:  Chief Complaint  Patient presents with   New Patient (Initial Visit)    HPI Kerry Mason presents to establish new primary care provider. He states that he has been having dizzy spells. States that dizziness is worse when he moves his head back and forth. He states that symptoms started around the same time as he had surgery for cataract. He states that first eye was around the first week of November. He states that one evening, he woke up and got out of bed, he twisted his ankle. He did go to the ER. The did MRI of the head which was normal. The patient will be having an initial visit with ENT provider next week for further evaluation.  The patient states that he does have extensive cardiac history. Sees his cardiologist on routine basis. He has dfibrillator, has several stents, and has had bypass surgery.  The patient is concerned about growth on his testicle. He states that he had vasectomy in 1985. Initially noted the growth then. Did have this evaluated. Was told this was vascular growth on outside of testicle and that it was benign. He states that he feels as though this may be getting larger. Is afraid that it is interfering with his ability to achieve erection and worsening the urinary frequency and urgency he feels.  He is due for routine, fasting labs He would like referral for screening colonoscopy.   Past Medical History:  Diagnosis Date   Acute systolic CHF (congestive heart failure) (HCC)    AICD (automatic cardioverter/defibrillator) present    Anxiety    Ascending aortic aneurysm 08/31/2017   Back pain 08/31/2017   Basal cell carcinoma    left arm   CAD (coronary artery disease)    a. 2000: s/p stent of RCA 2000 with BMS  b. 2015: STEMI s/p LHC with old occlusion of RCA and DESx2 to LAD  c. 04/04/76: complicated PCI on 3/2 for CTO of mid RCA with  coronary perforation and cardiac tamponade requiring emergent pericardiocentesis      Cardiac tamponade    a. 02/03/15 2/2 coronary perforation during CTO procedure. Sealed with graftmaster coated stent.    COPD (chronic obstructive pulmonary disease) ([place]) 08/31/2017   Gout    History of pneumonia    HTN (hypertension)    Hyperlipidemia    Ischemic cardiomyopathy    a. 2D ECHO: EF 25-30%. 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   LIMA to LAD, SVG to ramus intermediate branch, EVH via right thigh    Shortness of breath dyspnea    Tobacco abuse    Urinary frequency     Past Surgical History:  Procedure Laterality Date   CARDIAC CATHETERIZATION     CARDIAC CATHETERIZATION N/A 06/14/2015   Procedure: Left Heart Cath and Coronary Angiography;  Surgeon: Lorretta Harp, MD;  Location: [place];  Service: Cardiovascular;  Laterality: N/A;   CARDIAC CATHETERIZATION N/A 08/18/2015   Procedure: Intravascular Pressure Wire/FFR Study;  Surgeon: Peter M Martinique, MD;  Location: [place] [facility];  Service: Cardiovascular;  Laterality: N/A;   COLONOSCOPY     CORONARY ANGIOPLASTY  2000   CORONARY ANGIOPLASTY WITH STENT PLACEMENT  2015  CORONARY ARTERY BYPASS GRAFT N/A 09/02/2015   Procedure: CORONARY ARTERY BYPASS GRAFTING (CABG) x 2 (LIMA-LAD, SVG-Intermediate) ENDOSCOPIC GREATER SAPHENOUS VEIN HARVEST RIGHT THIGH;  Surgeon: Rexene Alberts, MD;  Location: [facility];  Service: Open Heart Surgery;  Laterality: N/A;   CORONARY BALLOON ANGIOPLASTY N/A 01/04/2021   Procedure: CORONARY BALLOON ANGIOPLASTY;  Surgeon: Jettie Booze, MD;  Location: [place] [facility];  Service: Cardiovascular;  Laterality: N/A;   FOOT SURGERY     IMPLANTABLE CARDIOVERTER DEFIBRILLATOR IMPLANT N/A 04/17/2014   Procedure: IMPLANTABLE CARDIOVERTER DEFIBRILLATOR IMPLANT;  Surgeon: Evans Lance, MD;  Location: [facility] CATH  LAB;  Service: Cardiovascular;  Laterality: N/A;   LEFT HEART CATH AND CORONARY ANGIOGRAPHY N/A 01/04/2021   Procedure: LEFT HEART CATH AND CORONARY ANGIOGRAPHY;  Surgeon: Jettie Booze, MD;  Location: [facility] [facility];  Service: Cardiovascular;  Laterality: N/A;   LEFT HEART CATH AND CORS/GRAFTS ANGIOGRAPHY N/A 08/30/2017   Procedure: LEFT HEART CATH AND CORS/GRAFTS ANGIOGRAPHY;  Surgeon: Nelva Bush, MD;  Location: [facility];  Service: Cardiovascular;  Laterality: N/A;   LEFT HEART CATHETERIZATION WITH CORONARY ANGIOGRAM N/A 12/11/2013   Procedure: LEFT HEART CATHETERIZATION WITH CORONARY ANGIOGRAM;  Surgeon: Peter M Martinique, MD;  Location: [facility] CATH LAB;  Service: Cardiovascular;  Laterality: N/A;   LEFT HEART CATHETERIZATION WITH CORONARY ANGIOGRAM N/A 01/05/2015   Procedure: LEFT HEART CATHETERIZATION WITH CORONARY ANGIOGRAM;  Surgeon: Peter M Martinique, MD;  Location: [facility] CATH LAB;  Service: Cardiovascular;  Laterality: N/A;   PERCUTANEOUS CORONARY STENT INTERVENTION (PCI-S)  12/11/2013   Procedure: PERCUTANEOUS CORONARY STENT INTERVENTION (PCI-S);  Surgeon: Peter M Martinique, MD;  Location: [facility] CATH LAB;  Service: Cardiovascular;;  prov LAD and mid LAD   PERCUTANEOUS CORONARY STENT INTERVENTION (PCI-S) N/A 02/03/2015   Procedure: PERCUTANEOUS CORONARY STENT INTERVENTION (PCI-S);  Surgeon: Jettie Booze, MD;  Location: [facility] CATH LAB;  Service: Cardiovascular;  Laterality: N/A;   TEE WITHOUT CARDIOVERSION N/A 09/02/2015   Procedure: TRANSESOPHAGEAL ECHOCARDIOGRAM (TEE);  Surgeon: Rexene Alberts, MD;  Location: [facility];  Service: Open Heart Surgery;  Laterality: N/A;    Family History  Problem Relation Age of Onset   CAD Father        PTCA   Cancer Mother        LYMPHOMA    Social History   Socioeconomic History   Marital status: Divorced    Spouse name: Not on file   Number of children: 2   Years of education: Not on file   Highest education level: Not on file  Occupational  History   Occupation: Pension scheme manager auction  Tobacco Use   Smoking status: Every Day    Packs/day: 0.50    Years: 35.00    Pack years: 17.50    Types: Cigarettes   Smokeless tobacco: Never  Vaping Use   Vaping Use: Never used  Substance and Sexual Activity   Alcohol use: No    Alcohol/week: 0.0 standard drinks   Drug use: No   Sexual activity: Not Currently  Other Topics Concern   Not on file  Social History Narrative   Not on file   Social Determinants of Health   Financial Resource Strain: Not on file  Food Insecurity: Not on file  Transportation Needs: Not on file  Physical Activity: Not on file  Stress: Not on file  Social Connections: Not on file  Intimate Partner Violence: Not on file    ROS Review of Systems  Constitutional:  Negative  for activity change, chills, fatigue and fever.  HENT:  Negative for congestion, postnasal drip, rhinorrhea, sinus pressure, sinus pain, sneezing and sore throat.   Eyes: Negative.   Respiratory:  Negative for cough, shortness of breath and wheezing.        Patient does have history of COPD   Cardiovascular:  Negative for chest pain and palpitations.       Extensive cardiac history. Is followed by cardiology.   Gastrointestinal:  Negative for constipation, diarrhea, nausea and vomiting.  Endocrine: Negative for cold intolerance, heat intolerance, polydipsia and polyuria.  Genitourinary:  Positive for frequency and urgency. Negative for dysuria.       Erectilee dysfunction.   Musculoskeletal:  Negative for back pain and myalgias.  Skin:  Negative for rash.  Allergic/Immunologic: Negative for environmental allergies.  Neurological:  Positive for dizziness. Negative for weakness and headaches.  Psychiatric/Behavioral:  The patient is not nervous/anxious.    Objective:   Today's Vitals   12/14/21 0831  BP: 113/73  Pulse: 62  Temp: 98.4 F (36.9 C)  SpO2: 96%  Weight: 227 lb 14.4 oz (103.4 kg)  Height: 6' (1.829 m)    Body mass index is 30.91 kg/m.   Physical Exam Vitals and nursing note reviewed.  Constitutional:      Appearance: Normal appearance. He is well-developed.  HENT:     Head: Normocephalic and atraumatic.     Right Ear: Tympanic membrane, ear canal and external ear normal.     Left Ear: Tympanic membrane, ear canal and external ear normal.     Nose: Nose normal.     Mouth/Throat:     Mouth: Mucous membranes are moist.     Pharynx: Oropharynx is clear.  Eyes:     Pupils: Pupils are equal, round, and reactive to light.  Neck:     Vascular: No carotid bruit.  Cardiovascular:     Rate and Rhythm: Normal rate and regular rhythm.     Pulses: Normal pulses.     Heart sounds: Normal heart sounds.  Pulmonary:     Effort: Pulmonary effort is normal.     Breath sounds: Normal breath sounds.  Abdominal:     Palpations: Abdomen is soft.  Musculoskeletal:        General: Normal range of motion.     Cervical back: Normal range of motion and neck supple.  Lymphadenopathy:     Cervical: No cervical adenopathy.  Skin:    General: Skin is warm and dry.     Capillary Refill: Capillary refill takes less than 2 seconds.  Neurological:     General: No focal deficit present.     Mental Status: He is alert and oriented to person, place, and time.  Psychiatric:        Mood and Affect: Mood normal.        Behavior: Behavior normal.        Thought Content: Thought content normal.        Judgment: Judgment normal.    Assessment & Plan:  1. Encounter to establish care Appointment today to establish new primary care provider    2. Dizziness Reviewed labs and MRI done in ER which were essentially normal. Will get carotid doppler study for further evaluation. He should keep initial consultation with ENT as scheduled  - US Carotid Bilateral; Future  3. Coronary artery disease due to lipid rich plaque Patient does have extensive cardiovascular history with high cholesterol. Will get carotid  doppler for further  evaluation.  - US Carotid Bilateral; Future  4. Chronic obstructive pulmonary disease, unspecified COPD type ([place]) Renew symbicort. Use rescue inhaler as needed and as prescribed  - budesonide-formoterol (SYMBICORT) 160-4.5 MCG/ACT inhaler; Inhale 2 puffs into the lungs 2 (two) times daily.  Dispense: 1 each; Refill: 6  5. Impaired fasting glucose Check HgbA1c with routine, fasting labs  - Hemoglobin A1c; Future - Hemoglobin A1c  6. Other fatigue Check TSH and CBC.  - TSH; Future - TSH - CBC; Future - CBC  7. Screening for prostate cancer Check PSA - PSA; Future - PSA  8. Screening for colon cancer Refer for screening colonoscopy today.  - Ambulatory referral to Gastroenterology  9. Healthcare maintenance Routine , fasting labs drawn during today's visit . - Lipid panel; Future - TSH; Future - Comp Met (CMET); Future - PSA; Future - Comp Met (CMET) - TSH - Lipid panel - PSA    Problem List Items Addressed This Visit       Cardiovascular and Mediastinum   CAD (coronary artery disease)   Relevant Orders   US Carotid Bilateral     Respiratory   COPD (chronic obstructive pulmonary disease) (HCC)   Relevant Medications   budesonide-formoterol (SYMBICORT) 160-4.5 MCG/ACT inhaler     Endocrine   Impaired fasting glucose   Relevant Orders   Hemoglobin A1c     Other   Dizziness   Relevant Orders   US Carotid Bilateral   Other fatigue   Relevant Orders   TSH   CBC   Other Visit Diagnoses     Encounter to establish care    -  Primary   Screening for prostate cancer       Relevant Orders   PSA   Screening for colon cancer       Relevant Orders   Ambulatory referral to Gastroenterology   Healthcare maintenance       Relevant Orders   Lipid panel   TSH   Comp Met (CMET)   PSA       Outpatient Encounter Medications as of 12/14/2021  Medication Sig   acetaminophen (TYLENOL) 325 MG tablet Take 2 tablets (650 mg total) by mouth  every 4 (four) hours as needed for headache or mild pain.   allopurinol (ZYLOPRIM) 300 MG tablet Take 1 tablet (300 mg total) by mouth daily.   apixaban (ELIQUIS) 5 MG TABS tablet Take 1 tablet (5 mg total) by mouth 2 (two) times daily.   aspirin EC 81 MG tablet Take 81 mg by mouth daily.   atorvastatin (LIPITOR) 80 MG tablet Take 1 tablet (80 mg total) by mouth daily.   carvedilol (COREG) 6.25 MG tablet Take 1 tablet (6.25 mg total) by mouth 2 (two) times daily.   cholecalciferol (VITAMIN D3) 25 MCG (1000 UNIT) tablet Take 1,000 Units by mouth daily.   ezetimibe (ZETIA) 10 MG tablet Take 1 tablet (10 mg total) by mouth daily.   furosemide (LASIX) 40 MG tablet Take 1 tablet (40 mg total) by mouth daily.   isosorbide mononitrate (IMDUR) 30 MG 24 hr tablet Take 1 tablet (30 mg total) by mouth daily.   ketorolac (ACULAR) 0.5 % ophthalmic solution 1 drop 4 (four) times daily.   lisinopril (ZESTRIL) 5 MG tablet Take 1 tablet (5 mg total) by mouth daily.   Magnesium Oxide 200 MG TABS Take 1 tablet (200 mg total) by mouth 2 (two) times daily.   meclizine (ANTIVERT) 25 MG tablet Take 1 tablet (25 mg  total) by mouth 3 (three) times daily as needed for dizziness.   nitroGLYCERIN (NITROSTAT) 0.4 MG SL tablet DISSOLVE ONE TABLET UNDER THE TONGUE EVERY 5 MINUTES AS NEEDED FOR CHEST PAIN.  DO NOT EXCEED A TOTAL OF 3 DOSES IN 15 MINUTES (Patient taking differently: Place 0.4 mg under the tongue See admin instructions. DISSOLVE ONE TABLET UNDER THE TONGUE EVERY 5 MINUTES AS NEEDED FOR CHEST PAIN.  DO NOT EXCEED A TOTAL OF 3 DOSES IN 15 MINUTES)   ofloxacin (OCUFLOX) 0.3 % ophthalmic solution Place 1 drop into both eyes in the morning and at bedtime.   prednisoLONE acetate (PRED FORTE) 1 % ophthalmic suspension Place 1 drop into the right eye 2 (two) times daily.   sildenafil (VIAGRA) 50 MG tablet TAKE 1 TABLET BY MOUTH DAILY AS NEEDED FOR ERECTILE DYSFUNCTION.   spironolactone (ALDACTONE) 25 MG tablet Take 1  tablet (25 mg total) by mouth at bedtime.   [DISCONTINUED] buPROPion (WELLBUTRIN XL) 150 MG 24 hr tablet Take 1 tablet (150 mg total) by mouth daily.   budesonide-formoterol (SYMBICORT) 160-4.5 MCG/ACT inhaler Inhale 2 puffs into the lungs 2 (two) times daily.   [DISCONTINUED] budesonide-formoterol (SYMBICORT) 160-4.5 MCG/ACT inhaler Inhale 2 puffs into the lungs 2 (two) times daily. (Patient not taking: Reported on 06/21/2020)   [DISCONTINUED] tiotropium (SPIRIVA) 18 MCG inhalation capsule Place 1 capsule (18 mcg total) into inhaler and inhale daily. (Patient not taking: Reported on 06/21/2020)   Facility-Administered Encounter Medications as of 12/14/2021  Medication   nicotine (NICODERM CQ - dosed in mg/24 hr) patch 14 mg    Follow-up: Return in about 3 weeks (around 01/04/2022) for medicare wellness.   [name], NP

## 2021-12-15 ENCOUNTER — Telehealth: Payer: Self-pay | Admitting: Cardiology

## 2021-12-15 DIAGNOSIS — I5022 Chronic systolic (congestive) heart failure: Secondary | ICD-10-CM

## 2021-12-15 DIAGNOSIS — I25119 Atherosclerotic heart disease of native coronary artery with unspecified angina pectoris: Secondary | ICD-10-CM

## 2021-12-15 LAB — CBC
Hematocrit: 46 % (ref 37.5–51.0)
Hemoglobin: 15.3 g/dL (ref 13.0–17.7)
MCH: 29.3 pg (ref 26.6–33.0)
MCHC: 33.3 g/dL (ref 31.5–35.7)
MCV: 88 fL (ref 79–97)
Platelets: 307 10*3/uL (ref 150–450)
RBC: 5.23 x10E6/uL (ref 4.14–5.80)
RDW: 13.6 % (ref 11.6–15.4)
WBC: 9.4 10*3/uL (ref 3.4–10.8)

## 2021-12-15 LAB — COMPREHENSIVE METABOLIC PANEL
ALT: 17 IU/L (ref 0–44)
AST: 20 IU/L (ref 0–40)
Albumin/Globulin Ratio: 1.3 (ref 1.2–2.2)
Albumin: 4.2 g/dL (ref 3.8–4.8)
Alkaline Phosphatase: 94 IU/L (ref 44–121)
BUN/Creatinine Ratio: 14 (ref 10–24)
BUN: 14 mg/dL (ref 8–27)
Bilirubin Total: 0.5 mg/dL (ref 0.0–1.2)
CO2: 22 mmol/L (ref 20–29)
Calcium: 9 mg/dL (ref 8.6–10.2)
Chloride: 105 mmol/L (ref 96–106)
Creatinine, Ser: 0.98 mg/dL (ref 0.76–1.27)
Globulin, Total: 3.2 g/dL (ref 1.5–4.5)
Glucose: 95 mg/dL (ref 70–99)
Potassium: 5 mmol/L (ref 3.5–5.2)
Sodium: 139 mmol/L (ref 134–144)
Total Protein: 7.4 g/dL (ref 6.0–8.5)
eGFR: 86 mL/min/{1.73_m2} (ref >59)

## 2021-12-15 LAB — TSH: TSH: 2.5 u[IU]/mL (ref 0.450–4.500)

## 2021-12-15 LAB — PSA: Prostate Specific Ag, Serum: 1.4 ng/mL (ref 0.0–4.0)

## 2021-12-15 LAB — LIPID PANEL
Chol/HDL Ratio: 3.1 ratio (ref 0.0–5.0)
Cholesterol, Total: 90 mg/dL — ABNORMAL LOW (ref 100–199)
HDL: 29 mg/dL — ABNORMAL LOW (ref >39)
LDL Chol Calc (NIH): 46 mg/dL (ref 0–99)
Triglycerides: 70 mg/dL (ref 0–149)
VLDL Cholesterol Cal: 15 mg/dL (ref 5–40)

## 2021-12-15 LAB — HEMOGLOBIN A1C
Est. average glucose Bld gHb Est-mCnc: 123 mg/dL
Hgb A1c MFr Bld: 5.9 % — ABNORMAL HIGH (ref 4.8–5.6)

## 2021-12-15 NOTE — Telephone Encounter (Signed)
Pt is needing to speak with Dr. Morrison Old nurse in regards to his medications... please advise

## 2021-12-15 NOTE — Progress Notes (Signed)
Labs good thus far. Will discuss with patient at visit 01/04/2022

## 2021-12-15 NOTE — Telephone Encounter (Signed)
Returned call to patient left message on personal voice mail to call back. 

## 2021-12-19 ENCOUNTER — Telehealth: Payer: Self-pay | Admitting: *Deleted

## 2021-12-19 ENCOUNTER — Telehealth: Payer: Self-pay

## 2021-12-19 ENCOUNTER — Other Ambulatory Visit: Payer: Self-pay

## 2021-12-19 DIAGNOSIS — Z1211 Encounter for screening for malignant neoplasm of colon: Secondary | ICD-10-CM

## 2021-12-19 MED ORDER — APIXABAN 5 MG PO TABS: 5.0000 mg | ORAL_TABLET | Freq: Two times a day (BID) | ORAL | 11 refills | Status: DC

## 2021-12-19 MED ORDER — NA SULFATE-K SULFATE-MG SULF 17.5-3.13-1.6 GM/177ML PO SOLN: 354.0000 mL | Freq: Once | ORAL | 0 refills | Status: AC

## 2021-12-19 MED ORDER — ENTRESTO 24-26 MG PO TABS: 1.0000 | ORAL_TABLET | Freq: Two times a day (BID) | ORAL | 6 refills | Status: DC

## 2021-12-19 NOTE — Telephone Encounter (Signed)
he is on eliquis we need to know how long to stop it before his procedure on 01/03/2022 and we need clearance if he can have procedure he has a defibrillator to he is scheduled for a colonoscopy at [facility] with general anathesia with DR Vicente Males    Pre-operative Risk Assessment    Patient Name: Kerry Mason  DOB: December 11, 1955 MRN: [id number]      Request for Surgical Clearance    Procedure:   COLONOSCOPY  Date of Surgery:  Clearance 01/03/22                                 Surgeon:  DR. Bailey Mech [name] Surgeon's Group or Practice Name:  [facility] Phone number:  [phone] Fax number:  [phone]   Type of Clearance Requested:   - Medical  - Pharmacy:  Hold Apixaban (Eliquis)     Type of Anesthesia:  General    Additional requests/questions:    Kerry Mason   12/19/2021, 1:35 PM

## 2021-12-19 NOTE — Telephone Encounter (Signed)
Patient with diagnosis of afib on Eliquis for anticoagulation.    Procedure:  COLONOSCOPY Date of procedure: 01/03/22  CHA2DS2-VASc Score = 4   This indicates a 4.8% annual risk of stroke. The patient's score is based upon: CHF History: 1 HTN History: 1 Diabetes History: 0 Stroke History: 0 Vascular Disease History: 1 Age Score: 1 Gender Score: 0     CrCl 93 ml/min  Per office protocol, patient can hold Eliquis for 1-2 days prior to procedure.

## 2021-12-19 NOTE — Telephone Encounter (Signed)
Spoke to patient Dr.Jordan's advice given.Advised to stop Lisinopril and start Entresto 24/26 mg twice a day in 3 days.Bmet to be done in 2 weeks.Lab order mailed.Scheduler will call back with a PharmD appointment in 3 to 4 weeks.

## 2021-12-19 NOTE — Telephone Encounter (Signed)
Spoke with pt, he has gotten his insurance straightened out and is now able to afford the eliquis and needs a prescription sent to the pharmacy. New script sent to the pharmacy. He also reports there had been a discussion in the past about entresto and now that he has new insurance he is willing to start if that is what dr Martinique wants. Aware will forward to dr Martinique to advise.

## 2021-12-19 NOTE — Telephone Encounter (Signed)
That's great. Yes I would like to try and switch to [facility]. He will need to be off lisinopril for at least 3 days before starting but then start 24/26 mg bid. Check BMET in 1-2 weeks and follow up with pharmacy for titration in 3-4 weeks.  Kazuto Sevey Martinique MD, [facility]

## 2021-12-19 NOTE — Telephone Encounter (Signed)
Called patient he stated our process doesn't work for him he needs to know a time to be there so he can get a driver to drive him called endo to see what we can do waiting for endo to call me back

## 2021-12-19 NOTE — Progress Notes (Signed)
Gastroenterology Pre-Procedure Review  Request Date: 01/03/2022 @6 :45AM  Requesting Physician: Dr. Vicente Males  PATIENT REVIEW QUESTIONS: The patient responded to the following health history questions as indicated:    1. Are you having any GI issues? no 2. Do you have a personal history of Polyps? no 3. Do you have a family history of Colon Cancer or Polyps? no 4. Diabetes Mellitus? no 5. Joint replacements in the past 12 months?no 6. Major health problems in the past 3 months?no 7. Any artificial heart valves, MVP, or defibrillator?yes (defibulater)    MEDICATIONS & ALLERGIES:    Patient reports the following regarding taking any anticoagulation/antiplatelet therapy:   Plavix, Coumadin, Eliquis, Xarelto, Lovenox, Pradaxa, Brilinta, or Effient? yes (eliquis) Aspirin? yes (81 mg)  Patient confirms/reports the following medications:  Current Outpatient Medications  Medication Sig Dispense Refill   acetaminophen (TYLENOL) 325 MG tablet Take 2 tablets (650 mg total) by mouth every 4 (four) hours as needed for headache or mild pain.     allopurinol (ZYLOPRIM) 300 MG tablet Take 1 tablet (300 mg total) by mouth daily. 90 tablet 3   apixaban (ELIQUIS) 5 MG TABS tablet Take 1 tablet (5 mg total) by mouth 2 (two) times daily. 180 tablet 3   aspirin EC 81 MG tablet Take 81 mg by mouth daily.     atorvastatin (LIPITOR) 80 MG tablet Take 1 tablet (80 mg total) by mouth daily. 90 tablet 3   budesonide-formoterol (SYMBICORT) 160-4.5 MCG/ACT inhaler Inhale 2 puffs into the lungs 2 (two) times daily. 1 each 6   buPROPion (WELLBUTRIN XL) 150 MG 24 hr tablet TAKE 1 TABLET BY MOUTH EVERY DAY 90 tablet 0   carvedilol (COREG) 6.25 MG tablet Take 1 tablet (6.25 mg total) by mouth 2 (two) times daily. 180 tablet 3   cholecalciferol (VITAMIN D3) 25 MCG (1000 UNIT) tablet Take 1,000 Units by mouth daily.     ezetimibe (ZETIA) 10 MG tablet Take 1 tablet (10 mg total) by mouth daily. 90 tablet 3   furosemide (LASIX)  40 MG tablet Take 1 tablet (40 mg total) by mouth daily. 90 tablet 1   isosorbide mononitrate (IMDUR) 30 MG 24 hr tablet Take 1 tablet (30 mg total) by mouth daily. 90 tablet 3   ketorolac (ACULAR) 0.5 % ophthalmic solution 1 drop 4 (four) times daily.     lisinopril (ZESTRIL) 5 MG tablet Take 1 tablet (5 mg total) by mouth daily. 90 tablet 3   Magnesium Oxide 200 MG TABS Take 1 tablet (200 mg total) by mouth 2 (two) times daily. 60 tablet 0   meclizine (ANTIVERT) 25 MG tablet Take 1 tablet (25 mg total) by mouth 3 (three) times daily as needed for dizziness. 30 tablet 0   nitroGLYCERIN (NITROSTAT) 0.4 MG SL tablet DISSOLVE ONE TABLET UNDER THE TONGUE EVERY 5 MINUTES AS NEEDED FOR CHEST PAIN.  DO NOT EXCEED A TOTAL OF 3 DOSES IN 15 MINUTES (Patient taking differently: Place 0.4 mg under the tongue See admin instructions. DISSOLVE ONE TABLET UNDER THE TONGUE EVERY 5 MINUTES AS NEEDED FOR CHEST PAIN.  DO NOT EXCEED A TOTAL OF 3 DOSES IN 15 MINUTES) 25 tablet 2   ofloxacin (OCUFLOX) 0.3 % ophthalmic solution Place 1 drop into both eyes in the morning and at bedtime.     prednisoLONE acetate (PRED FORTE) 1 % ophthalmic suspension Place 1 drop into the right eye 2 (two) times daily.     sildenafil (VIAGRA) 50 MG tablet TAKE 1 TABLET  BY MOUTH DAILY AS NEEDED FOR ERECTILE DYSFUNCTION. 10 tablet 3   spironolactone (ALDACTONE) 25 MG tablet Take 1 tablet (25 mg total) by mouth at bedtime. 90 tablet 3   Current Facility-Administered Medications  Medication Dose Route Frequency Provider Last Rate Last Admin   nicotine (NICODERM CQ - dosed in mg/24 hr) patch 14 mg  14 mg Transdermal Daily Duke, Tami Lin, PA        Patient confirms/reports the following allergies:  No Known Allergies  No orders of the defined types were placed in this encounter.   AUTHORIZATION INFORMATION Primary Insurance: 1D#: Group #:  Secondary Insurance: 1D#: Group #:  SCHEDULE INFORMATION: Date:  01/03/2022 Time: Location:[facility]

## 2021-12-20 ENCOUNTER — Telehealth: Payer: Self-pay

## 2021-12-20 NOTE — Telephone Encounter (Signed)
° °  Primary Cardiologist: Peter Martinique, MD  Chart reviewed as part of pre-operative protocol coverage. Given past medical history and time since last visit, based on ACC/AHA guidelines, Jasim Segler would be at acceptable risk for the planned procedure without further cardiovascular testing.   Patient with diagnosis of afib on Eliquis for anticoagulation.     Procedure:  COLONOSCOPY Date of procedure: 01/03/22   CHA2DS2-VASc Score = 4   This indicates a 4.8% annual risk of stroke. The patient's score is based upon: CHF History: 1 HTN History: 1 Diabetes History: 0 Stroke History: 0 Vascular Disease History: 1 Age Score: 1 Gender Score: 0     CrCl 93 ml/min   Per office protocol, patient can hold Eliquis for 1-2 days prior to procedure.  I will route this recommendation to the requesting party via Epic fax function and remove from pre-op pool.  Please call with questions.  Jossie Ng. Tnia Anglada NP-C    12/20/2021, 8:57 AM [place] [address] Office [phone] Fax [phone]

## 2021-12-20 NOTE — Telephone Encounter (Signed)
CALLED PATIENT NO ANSWER LEFT VOICEMAIL FOR A CALL BACK To let him know he can stop his eliquis 2 days before his procedure per heartcare

## 2021-12-21 ENCOUNTER — Ambulatory Visit
Admission: RE | Admit: 2021-12-21 | Discharge: 2021-12-21 | Disposition: A | Discharge status: Home / Self Care | Payer: Medicare Other | Source: Ambulatory Visit | Attending: Nurse Practitioner | Admitting: Nurse Practitioner

## 2021-12-21 DIAGNOSIS — R42 Dizziness and giddiness: Secondary | ICD-10-CM

## 2021-12-21 DIAGNOSIS — I2510 Atherosclerotic heart disease of native coronary artery without angina pectoris: Secondary | ICD-10-CM

## 2021-12-21 NOTE — Progress Notes (Signed)
Moderate carotid atherosclerosis. <50% stenosis bilaterally. Discuss with patient at visit 01/04/2022

## 2021-12-22 ENCOUNTER — Ambulatory Visit (INDEPENDENT_AMBULATORY_CARE_PROVIDER_SITE_OTHER): Payer: Medicare Other | Admitting: Pharmacist Clinician (PhC)/ Clinical Pharmacy Specialist

## 2021-12-22 ENCOUNTER — Other Ambulatory Visit: Payer: Self-pay | Admitting: Nurse Practitioner

## 2021-12-22 ENCOUNTER — Telehealth: Payer: Self-pay | Admitting: Nurse Practitioner

## 2021-12-22 ENCOUNTER — Other Ambulatory Visit: Payer: Self-pay

## 2021-12-22 DIAGNOSIS — I5022 Chronic systolic (congestive) heart failure: Secondary | ICD-10-CM | POA: Diagnosis not present

## 2021-12-22 DIAGNOSIS — Z1211 Encounter for screening for malignant neoplasm of colon: Secondary | ICD-10-CM

## 2021-12-22 DIAGNOSIS — I2583 Coronary atherosclerosis due to lipid rich plaque: Secondary | ICD-10-CM

## 2021-12-22 DIAGNOSIS — I2510 Atherosclerotic heart disease of native coronary artery without angina pectoris: Secondary | ICD-10-CM

## 2021-12-22 MED ORDER — SACUBITRIL-VALSARTAN 24-26 MG PO TABS: 1.0000 | ORAL_TABLET | Freq: Two times a day (BID) | ORAL | 6 refills | Status: DC

## 2021-12-22 NOTE — Telephone Encounter (Signed)
Hey. I have placed a new referral to [facility] for this patient. Looks like he is already scheduled through [facility] for colonoscopy 01/03/2022. Will he call them to cancel?

## 2021-12-22 NOTE — Progress Notes (Signed)
12/23/2021 Kerry Mason [birth year] [id number]   HPI:  Kerry Mason is a 66 y.o. male patient of Dr Martinique, with a [place] below who presents today for heart failure medication titration.  He was seen by Dr. Martinique last month, and it was noted that he had been denied Entresto by insurer.  He was continued on lisinopril at that time.  Today he is in the office for GDMT management.  He reports feeling well and has no concerns about his medications at this time.  States he took last dose of lisinopril 4-5 days ago.    Past Medical History: CAD S/p CABG and multiple PCI (remote stenting of RCA in 2000, STEMI (1/15), NSTEMI (7/16)  hyperlipidemia 1/23 - LDL 46 on atorvastatin 80  AAA Followed by [facility], measured 5.1 cm  hypertension Controlled with HF medications  gout Now on allopurinol     Blood Pressure Goal:  130/80  Current Medications: Entresto 24/26 mg bid, carvedilol 6.25 mg bid, spironolactone 25 mg qd  Family Hx:  neither parent with heart disease mom died at 77 - leukemia, dad at 44 Parkinson's, brother doesn't go to MD; son 67 now with high cholesterol  Social Hx: smokes about 1/2 ppd; no alcohol, 1 cup coffee each morning  Diet: mix of home and out; occasional fast food; some fresh veggies   Exercise: walks all day, usually 10,000- 14,000 steps by evening  Home BP readings: checks occasionally, no written information with him today  Intolerances: nkda  Labs: 1/23:  Na 139, K 5, Glu 95, BUN 14, SCr 0.98, GFR 86   Wt Readings from Last 3 Encounters:  12/22/21 231 lb (104.8 kg)  12/14/21 227 lb 14.4 oz (103.4 kg)  11/11/21 230 lb 3.2 oz (104.4 kg)   BP Readings from Last 3 Encounters:  12/22/21 120/78  12/14/21 113/73  11/24/21 126/74   Pulse Readings from Last 3 Encounters:  12/22/21 80  12/14/21 62  11/24/21 61    Current Outpatient Medications  Medication Sig Dispense Refill   acetaminophen (TYLENOL) 325 MG tablet Take 2 tablets (650 mg total) by mouth  every 4 (four) hours as needed for headache or mild pain.     allopurinol (ZYLOPRIM) 300 MG tablet Take 1 tablet (300 mg total) by mouth daily. 90 tablet 3   apixaban (ELIQUIS) 5 MG TABS tablet Take 1 tablet (5 mg total) by mouth 2 (two) times daily. 60 tablet 11   aspirin EC 81 MG tablet Take 81 mg by mouth daily.     atorvastatin (LIPITOR) 80 MG tablet Take 1 tablet (80 mg total) by mouth daily. 90 tablet 3   budesonide-formoterol (SYMBICORT) 160-4.5 MCG/ACT inhaler Inhale 2 puffs into the lungs 2 (two) times daily. 1 each 6   buPROPion (WELLBUTRIN XL) 150 MG 24 hr tablet TAKE 1 TABLET BY MOUTH EVERY DAY 90 tablet 0   carvedilol (COREG) 6.25 MG tablet Take 1 tablet (6.25 mg total) by mouth 2 (two) times daily. 180 tablet 3   cholecalciferol (VITAMIN D3) 25 MCG (1000 UNIT) tablet Take 1,000 Units by mouth daily.     ezetimibe (ZETIA) 10 MG tablet Take 1 tablet (10 mg total) by mouth daily. 90 tablet 3   furosemide (LASIX) 40 MG tablet Take 1 tablet (40 mg total) by mouth daily. 90 tablet 1   isosorbide mononitrate (IMDUR) 30 MG 24 hr tablet Take 1 tablet (30 mg total) by mouth daily. 90 tablet 3   ketorolac (ACULAR) 0.5 %  ophthalmic solution 1 drop 4 (four) times daily.     nitroGLYCERIN (NITROSTAT) 0.4 MG SL tablet DISSOLVE ONE TABLET UNDER THE TONGUE EVERY 5 MINUTES AS NEEDED FOR CHEST PAIN.  DO NOT EXCEED A TOTAL OF 3 DOSES IN 15 MINUTES 25 tablet 2   ofloxacin (OCUFLOX) 0.3 % ophthalmic solution Place 1 drop into both eyes in the morning and at bedtime.     prednisoLONE acetate (PRED FORTE) 1 % ophthalmic suspension Place 1 drop into the right eye 2 (two) times daily.     sacubitril-valsartan (ENTRESTO) 24-26 MG Take 1 tablet by mouth 2 (two) times daily. 60 tablet 6   sildenafil (VIAGRA) 50 MG tablet TAKE 1 TABLET BY MOUTH DAILY AS NEEDED FOR ERECTILE DYSFUNCTION. 10 tablet 3   spironolactone (ALDACTONE) 25 MG tablet Take 1 tablet (25 mg total) by mouth at bedtime. 90 tablet 3   Magnesium  Oxide 200 MG TABS Take 1 tablet (200 mg total) by mouth 2 (two) times daily. (Patient not taking: Reported on 12/22/2021) 60 tablet 0   Current Facility-Administered Medications  Medication Dose Route Frequency Provider Last Rate Last Admin   nicotine (NICODERM CQ - dosed in mg/24 hr) patch 14 mg  14 mg Transdermal Daily Duke, Tami Lin, PA        No Known Allergies  Past Medical History:  Diagnosis Date   Acute systolic CHF (congestive heart failure) (HCC)    AICD (automatic cardioverter/defibrillator) present    Anxiety    Ascending aortic aneurysm 08/31/2017   Back pain 08/31/2017   Basal cell carcinoma    left arm   CAD (coronary artery disease)    a. 2000: s/p stent of RCA 2000 with BMS  b. 2015: STEMI s/p LHC with old occlusion of RCA and DESx2 to LAD  c. 02/04/18: complicated PCI on 3/2 for CTO of mid RCA with coronary perforation and cardiac tamponade requiring emergent pericardiocentesis      Cardiac tamponade    a. 02/03/15 2/2 coronary perforation during CTO procedure. Sealed with graftmaster coated stent.    COPD (chronic obstructive pulmonary disease) ([place]) 08/31/2017   Gout    History of pneumonia    HTN (hypertension)    Hyperlipidemia    Ischemic cardiomyopathy    a. 2D ECHO: EF 25-30%. Akinesis of the anteroseptal and   Left main coronary artery disease    Myocardial infarction ([place])    Obesity    S/P CABG x 2 09/02/2015   LIMA to LAD, SVG to ramus intermediate branch, EVH via right thigh    Shortness of breath dyspnea    Tobacco abuse    Urinary frequency     Blood pressure 120/78, pulse 80, resp. rate 14, height 6\' 1"  (1.854 m), weight 231 lb (104.8 kg), SpO2 96 %.  Chronic systolic CHF (congestive heart failure), NYHA class 3 ([place]) Patient with HFrEF currently not on full GDMT.  Reviewed medications needed for best outcomes with HF.  Patient will start Entresto 24/26 mg bid today as his last dose of lisinopril was > 72 hours ago.  Also discussed addition of  Farxiga or Jardiance, but will wait until next visit to be sure cost of each branded drug will be acceptable.  He will need to go to the lab in 2 weeks to check kidney function and we will see him back in 4 weeks for further medication titration.  Unsure why he was previously denied Entresto, as usually PA not required.  Will give him  free 30 day coupon and we can submit prescription to pharmacy for insurance coverage at next appointment.     Tommy Medal PharmD CPP [facility] [address] [place], [place] [address] [phone]

## 2021-12-22 NOTE — Telephone Encounter (Signed)
Called pt he is advised of his knew referral that was placed

## 2021-12-22 NOTE — Telephone Encounter (Signed)
Patient called and stated Gertie Fey just reached out to him for his colonoscopy however they were in [place] and he wants to be referred to a gastro office in [place]. Please advise. [phone]

## 2021-12-22 NOTE — Patient Instructions (Addendum)
Return for a a follow up appointment Thursday Feb 16 at 1:30 pm  Go to the lab first week in February to check kidney function and electrolytes  Check your blood pressure at home 3-4 days per week and keep record of the readings.  Take your meds as follows:  Start Entresto 24/26 mg twice daily  Continue with all other medications.  Bring all of your meds, your BP cuff and your record of home blood pressures to your next appointment.  Exercise as youre able, try to walk approximately 30 minutes per day.  Keep salt intake to a minimum, especially watch canned and prepared boxed foods.  Eat more fresh fruits and vegetables and fewer canned items.  Avoid eating in fast food restaurants.    HOW TO TAKE YOUR BLOOD PRESSURE: Rest 5 minutes before taking your blood pressure.  Dont smoke or drink caffeinated beverages for at least 30 minutes before. Take your blood pressure before (not after) you eat. Sit comfortably with your back supported and both feet on the floor (dont cross your legs). Elevate your arm to heart level on a table or a desk. Use the proper sized cuff. It should fit smoothly and snugly around your bare upper arm. There should be enough room to slip a fingertip under the cuff. The bottom edge of the cuff should be 1 inch above the crease of the elbow. Ideally, take 3 measurements at one sitting and record the average.

## 2021-12-23 ENCOUNTER — Encounter: Payer: Self-pay | Admitting: Pharmacist Clinician (PhC)/ Clinical Pharmacy Specialist

## 2021-12-23 NOTE — Assessment & Plan Note (Addendum)
Patient with HFrEF currently not on full GDMT.  Reviewed medications needed for best outcomes with HF.  Patient will start Entresto 24/26 mg bid today as his last dose of lisinopril was > 72 hours ago.  Also discussed addition of Farxiga or Jardiance, but will wait until next visit to be sure cost of each branded drug will be acceptable.  He will need to go to the lab in 2 weeks to check kidney function and we will see him back in 4 weeks for further medication titration.  Unsure why he was previously denied Entresto, as usually PA not required.  Will give him free 30 day coupon and we can submit prescription to pharmacy for insurance coverage at next appointment.

## 2021-12-27 ENCOUNTER — Telehealth: Payer: Self-pay

## 2021-12-27 ENCOUNTER — Encounter: Payer: Self-pay | Admitting: Physician Assistant

## 2021-12-27 NOTE — Telephone Encounter (Signed)
Called patient with blood thinner clearance he has chose [facility] to do his procedure now and canceled ours I called endo and canceled

## 2022-01-03 ENCOUNTER — Ambulatory Visit: Admit: 2022-01-03 | Payer: Medicare Other | Admitting: Gastroenterology

## 2022-01-03 SURGERY — COLONOSCOPY WITH PROPOFOL
Anesthesia: General

## 2022-01-04 ENCOUNTER — Other Ambulatory Visit: Payer: Self-pay

## 2022-01-04 ENCOUNTER — Encounter: Payer: Self-pay | Admitting: Nurse Practitioner

## 2022-01-04 ENCOUNTER — Ambulatory Visit (INDEPENDENT_AMBULATORY_CARE_PROVIDER_SITE_OTHER): Payer: Medicare Other | Admitting: Nurse Practitioner

## 2022-01-04 VITALS — BP 93/58 | HR 65 | Temp 97.9°F | Ht 73.0 in | Wt 233.4 lb

## 2022-01-04 DIAGNOSIS — I2510 Atherosclerotic heart disease of native coronary artery without angina pectoris: Secondary | ICD-10-CM

## 2022-01-04 DIAGNOSIS — M109 Gout, unspecified: Secondary | ICD-10-CM | POA: Diagnosis not present

## 2022-01-04 DIAGNOSIS — Z0000 Encounter for general adult medical examination without abnormal findings: Secondary | ICD-10-CM | POA: Diagnosis not present

## 2022-01-04 DIAGNOSIS — J449 Chronic obstructive pulmonary disease, unspecified: Secondary | ICD-10-CM

## 2022-01-04 DIAGNOSIS — I2583 Coronary atherosclerosis due to lipid rich plaque: Secondary | ICD-10-CM

## 2022-01-04 MED ORDER — ALLOPURINOL 300 MG PO TABS: 300.0000 mg | ORAL_TABLET | Freq: Two times a day (BID) | ORAL | 3 refills | Status: DC

## 2022-01-04 MED ORDER — TIOTROPIUM BROMIDE MONOHYDRATE 18 MCG IN CAPS: 18.0000 ug | ORAL_CAPSULE | Freq: Every day | RESPIRATORY_TRACT | 12 refills | Status: DC

## 2022-01-04 MED ORDER — COLCHICINE 0.6 MG PO TABS: ORAL_TABLET | ORAL | 2 refills | Status: DC

## 2022-01-04 NOTE — Progress Notes (Signed)
Subjective:   Sanjay Broadfoot is a 66 y.o. male who presents for Medicare Annual/Subsequent preventive examination today, he is having gout flare in left knee. He states that pain is behind the knee cap and feels like it's being pushed forward. He is able to walk, but bending his left leg is very difficult.  He states that he feels well. Cardiologist started him on Entresto. Now that he has been feeling better since his body has adjust to taking this .  He is scheduled to see urologist later today.  Saw eye doctor earlier this week.  Had routine fasting labs done prior to this visit.     Review of Systems    Review of Systems  Constitutional:  Negative for fever, malaise/fatigue and weight loss.  HENT:  Negative for congestion, ear discharge, ear pain, hearing loss and sore throat.   Eyes: Negative.   Respiratory:  Negative for cough, shortness of breath and wheezing.   Cardiovascular:  Negative for chest pain and orthopnea.  Gastrointestinal:  Negative for abdominal pain, blood in stool, constipation, diarrhea, nausea and vomiting.  Genitourinary:  Negative for dysuria, flank pain, frequency and urgency.  Musculoskeletal:  Positive for joint pain and myalgias. Negative for back pain.       Left knee   Skin:  Negative for itching and rash.  Neurological:  Negative for dizziness, weakness and headaches.  Psychiatric/Behavioral:  Negative for depression.          Objective:    Today's Vitals   01/04/22 0835  BP: (!) 93/58  Pulse: 65  Temp: 97.9 F (36.6 C)  SpO2: 96%  Weight: 233 lb 6.4 oz (105.9 kg)  Height: 6\' 1"  (1.854 m)   Body mass index is 30.79 kg/m.  Physical Exam Vitals and nursing note reviewed.  Constitutional:      Appearance: Normal appearance. He is well-developed.  HENT:     Head: Normocephalic and atraumatic.     Right Ear: Tympanic membrane, ear canal and external ear normal.     Left Ear: Tympanic membrane, ear canal and external ear normal.      Nose: Nose normal.     Mouth/Throat:     Mouth: Mucous membranes are moist.     Pharynx: Oropharynx is clear.  Eyes:     Extraocular Movements: Extraocular movements intact.     Conjunctiva/sclera: Conjunctivae normal.     Pupils: Pupils are equal, round, and reactive to light.  Neck:     Vascular: No carotid bruit.  Cardiovascular:     Rate and Rhythm: Normal rate and regular rhythm.     Pulses: Normal pulses.     Heart sounds: Normal heart sounds.  Pulmonary:     Effort: Pulmonary effort is normal.     Breath sounds: Normal breath sounds.  Abdominal:     General: Bowel sounds are normal. There is no distension.     Palpations: Abdomen is soft. There is no mass.     Tenderness: There is no abdominal tenderness. There is no guarding or rebound.     Hernia: No hernia is present.  Musculoskeletal:        General: Normal range of motion.     Cervical back: Normal range of motion and neck supple.     Comments: Tenderness  with palpation of left knee. Mild swelling of medial and inferior aspect of that knee. Ability to flex and extend the left knee is limited due to pain.   Lymphadenopathy:  Cervical: No cervical adenopathy.  Skin:    General: Skin is warm and dry.     Capillary Refill: Capillary refill takes less than 2 seconds.  Neurological:     General: No focal deficit present.     Mental Status: He is alert and oriented to person, place, and time.  Psychiatric:        Mood and Affect: Mood normal.        Behavior: Behavior normal.        Thought Content: Thought content normal.        Judgment: Judgment normal.       Advanced Directives 11/24/2021 01/04/2021 06/20/2020 08/29/2017 08/29/2017 06/14/2017 09/02/2015  Does Patient Have a Medical Advance Directive? No No No No No No No  Would patient like information on creating a medical advance directive? No - Patient declined No - Patient declined No - Patient declined No - Patient declined No - Patient declined - No - patient  declined information  Pre-existing out of facility DNR order (yellow form or pink MOST form) - - - - - - -    Current Medications (verified) Outpatient Encounter Medications as of 01/04/2022  Medication Sig   acetaminophen (TYLENOL) 325 MG tablet Take 2 tablets (650 mg total) by mouth every 4 (four) hours as needed for headache or mild pain.   apixaban (ELIQUIS) 5 MG TABS tablet Take 1 tablet (5 mg total) by mouth 2 (two) times daily.   aspirin EC 81 MG tablet Take 81 mg by mouth daily.   atorvastatin (LIPITOR) 80 MG tablet Take 1 tablet (80 mg total) by mouth daily.   budesonide-formoterol (SYMBICORT) 160-4.5 MCG/ACT inhaler Inhale 2 puffs into the lungs 2 (two) times daily.   buPROPion (WELLBUTRIN XL) 150 MG 24 hr tablet TAKE 1 TABLET BY MOUTH EVERY DAY   carvedilol (COREG) 6.25 MG tablet Take 1 tablet (6.25 mg total) by mouth 2 (two) times daily.   cholecalciferol (VITAMIN D3) 25 MCG (1000 UNIT) tablet Take 1,000 Units by mouth daily.   colchicine 0.6 MG tablet Take 2 tablet po one time.  Then take 1 tablet after one hour. Then take 1 tablet daily for 5 days.   ezetimibe (ZETIA) 10 MG tablet Take 1 tablet (10 mg total) by mouth daily.   furosemide (LASIX) 40 MG tablet Take 1 tablet (40 mg total) by mouth daily.   isosorbide mononitrate (IMDUR) 30 MG 24 hr tablet Take 1 tablet (30 mg total) by mouth daily.   ketorolac (ACULAR) 0.5 % ophthalmic solution 1 drop 4 (four) times daily.   Na Sulfate-K Sulfate-Mg Sulf 17.5-3.13-1.6 GM/177ML SOLN SMARTSIG:354 Milliliter(s) By Mouth Once   nitroGLYCERIN (NITROSTAT) 0.4 MG SL tablet DISSOLVE ONE TABLET UNDER THE TONGUE EVERY 5 MINUTES AS NEEDED FOR CHEST PAIN.  DO NOT EXCEED A TOTAL OF 3 DOSES IN 15 MINUTES   ofloxacin (OCUFLOX) 0.3 % ophthalmic solution Place 1 drop into both eyes in the morning and at bedtime.   prednisoLONE acetate (PRED FORTE) 1 % ophthalmic suspension Place 1 drop into the right eye 2 (two) times daily.   sacubitril-valsartan  (ENTRESTO) 24-26 MG Take 1 tablet by mouth 2 (two) times daily.   sildenafil (VIAGRA) 50 MG tablet TAKE 1 TABLET BY MOUTH DAILY AS NEEDED FOR ERECTILE DYSFUNCTION.   spironolactone (ALDACTONE) 25 MG tablet Take 1 tablet (25 mg total) by mouth at bedtime.   [DISCONTINUED] allopurinol (ZYLOPRIM) 300 MG tablet Take 1 tablet (300 mg total) by mouth daily.  allopurinol (ZYLOPRIM) 300 MG tablet Take 1 tablet (300 mg total) by mouth 2 (two) times daily.   Magnesium Oxide 200 MG TABS Take 1 tablet (200 mg total) by mouth 2 (two) times daily. (Patient not taking: Reported on 12/22/2021)   tiotropium (SPIRIVA) 18 MCG inhalation capsule Place 1 capsule (18 mcg total) into inhaler and inhale daily.   [DISCONTINUED] tiotropium (SPIRIVA) 18 MCG inhalation capsule Place 1 capsule (18 mcg total) into inhaler and inhale daily.   Facility-Administered Encounter Medications as of 01/04/2022  Medication   nicotine (NICODERM CQ - dosed in mg/24 hr) patch 14 mg    Allergies (verified) Patient has no known allergies.   History: Past Medical History:  Diagnosis Date   Acute systolic CHF (congestive heart failure) (HCC)    AICD (automatic cardioverter/defibrillator) present    Anxiety    Ascending aortic aneurysm 08/31/2017   Back pain 08/31/2017   Basal cell carcinoma    left arm   CAD (coronary artery disease)    a. 2000: s/p stent of RCA 2000 with BMS  b. 2015: STEMI s/p LHC with old occlusion of RCA and DESx2 to LAD  c. 02/07/63: complicated PCI on 3/2 for CTO of mid RCA with coronary perforation and cardiac tamponade requiring emergent pericardiocentesis      Cardiac tamponade    a. 02/03/15 2/2 coronary perforation during CTO procedure. Sealed with graftmaster coated stent.    COPD (chronic obstructive pulmonary disease) ([place]) 08/31/2017   Gout    History of pneumonia    HTN (hypertension)    Hyperlipidemia    Ischemic cardiomyopathy    a. 2D ECHO: EF 25-30%. 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   LIMA to LAD, SVG to ramus intermediate branch, EVH via right thigh    Shortness of breath dyspnea    Tobacco abuse    Urinary frequency    Past Surgical History:  Procedure Laterality Date   CARDIAC CATHETERIZATION     CARDIAC CATHETERIZATION N/A 06/14/2015   Procedure: Left Heart Cath and Coronary Angiography;  Surgeon: Lorretta Harp, MD;  Location: [place] [facility];  Service: Cardiovascular;  Laterality: N/A;   CARDIAC CATHETERIZATION N/A 08/18/2015   Procedure: Intravascular Pressure Wire/FFR Study;  Surgeon: Peter M Martinique, MD;  Location: [place] [facility];  Service: Cardiovascular;  Laterality: N/A;   COLONOSCOPY     CORONARY ANGIOPLASTY  2000   CORONARY ANGIOPLASTY WITH STENT PLACEMENT  2015   CORONARY ARTERY BYPASS GRAFT N/A 09/02/2015   Procedure: CORONARY ARTERY BYPASS GRAFTING (CABG) x 2 (LIMA-LAD, SVG-Intermediate) ENDOSCOPIC GREATER SAPHENOUS VEIN HARVEST RIGHT THIGH;  Surgeon: Rexene Alberts, MD;  Location: [place];  Service: Open Heart Surgery;  Laterality: N/A;   CORONARY BALLOON ANGIOPLASTY N/A 01/04/2021   Procedure: CORONARY BALLOON ANGIOPLASTY;  Surgeon: Jettie Booze, MD;  Location: [place] [facility];  Service: Cardiovascular;  Laterality: N/A;   FOOT SURGERY     IMPLANTABLE CARDIOVERTER DEFIBRILLATOR IMPLANT N/A 04/17/2014   Procedure: IMPLANTABLE CARDIOVERTER DEFIBRILLATOR IMPLANT;  Surgeon: Evans Lance, MD;  Location: [facility] CATH LAB;  Service: Cardiovascular;  Laterality: N/A;   LEFT HEART CATH AND CORONARY ANGIOGRAPHY N/A 01/04/2021   Procedure: LEFT HEART CATH AND CORONARY ANGIOGRAPHY;  Surgeon: Jettie Booze, MD;  Location: [place] [facility];  Service: Cardiovascular;  Laterality: N/A;   LEFT HEART  CATH AND CORS/GRAFTS ANGIOGRAPHY N/A 08/30/2017   Procedure: LEFT HEART CATH AND CORS/GRAFTS ANGIOGRAPHY;  Surgeon: Nelva Bush, MD;  Location: [place]  [facility];  Service: Cardiovascular;  Laterality: N/A;   LEFT HEART CATHETERIZATION WITH CORONARY ANGIOGRAM N/A 12/11/2013   Procedure: LEFT HEART CATHETERIZATION WITH CORONARY ANGIOGRAM;  Surgeon: Peter M Martinique, MD;  Location: [facility] CATH LAB;  Service: Cardiovascular;  Laterality: N/A;   LEFT HEART CATHETERIZATION WITH CORONARY ANGIOGRAM N/A 01/05/2015   Procedure: LEFT HEART CATHETERIZATION WITH CORONARY ANGIOGRAM;  Surgeon: Peter M Martinique, MD;  Location: [facility] CATH LAB;  Service: Cardiovascular;  Laterality: N/A;   PERCUTANEOUS CORONARY STENT INTERVENTION (PCI-S)  12/11/2013   Procedure: PERCUTANEOUS CORONARY STENT INTERVENTION (PCI-S);  Surgeon: Peter M Martinique, MD;  Location: [facility] CATH LAB;  Service: Cardiovascular;;  prov LAD and mid LAD   PERCUTANEOUS CORONARY STENT INTERVENTION (PCI-S) N/A 02/03/2015   Procedure: PERCUTANEOUS CORONARY STENT INTERVENTION (PCI-S);  Surgeon: Jettie Booze, MD;  Location: [facility] CATH LAB;  Service: Cardiovascular;  Laterality: N/A;   TEE WITHOUT CARDIOVERSION N/A 09/02/2015   Procedure: TRANSESOPHAGEAL ECHOCARDIOGRAM (TEE);  Surgeon: Rexene Alberts, MD;  Location: [place];  Service: Open Heart Surgery;  Laterality: N/A;   Family History  Problem Relation Age of Onset   CAD Father        PTCA   Cancer Mother        LYMPHOMA   Social History   Socioeconomic History   Marital status: Divorced    Spouse name: Not on file   Number of children: 2   Years of education: Not on file   Highest education level: Not on file  Occupational History   Occupation: Pension scheme manager auction  Tobacco Use   Smoking status: Every Day    Packs/day: 0.50    Years: 35.00    Pack years: 17.50    Types: Cigarettes   Smokeless tobacco: Never  Vaping Use   Vaping Use: Never used  Substance and Sexual Activity   Alcohol use: No    Alcohol/week: 0.0 standard drinks   Drug use: No   Sexual activity: Not Currently  Other Topics Concern   Not on file  Social History Narrative   Not on  file   Social Determinants of Health   Financial Resource Strain: Not on file  Food Insecurity: Not on file  Transportation Needs: Not on file  Physical Activity: Not on file  Stress: Not on file  Social Connections: Not on file    Tobacco Counseling Patient is a current, every day smoker. Currently taking wellbutrin to help him with smoking cessation.   Diabetic?no  Activities of Daily Living In your present state of health, do you have any difficulty performing the following activities: 01/04/2022 12/14/2021  Hearing? N Y  Vision? N N  Difficulty concentrating or making decisions? N N  Walking or climbing stairs? Y Y  Dressing or bathing? N N  Doing errands, shopping? N N  Some recent data might be hidden    Patient Care Team: [name], NP as PCP - General (Family Medicine) Martinique, Peter M, MD as PCP - Cardiology (Cardiology)  Indicate any recent Medical Services you may have received from other than [facility] providers in the past year (date may be approximate).     Assessment:  1. Encounter for Medicare annual wellness exam Annual Medicare wellness visit today   2. Acute gouty arthropathy Start colchicine for acute gout. Increase allopurinol to 300mg  twice daily to reduce  severity and frequency of gout flares  - allopurinol (ZYLOPRIM) 300 MG tablet; Take 1 tablet (300 mg total) by mouth 2 (two) times daily.  Dispense: 180 tablet; Refill: 3 - colchicine 0.6 MG tablet; Take 2 tablet po one time.  Then take 1 tablet after one hour. Then take 1 tablet daily for 5 days.  Dispense: 8 tablet; Refill: 2  3. Chronic obstructive pulmonary disease, unspecified COPD type ([place]) Generally stable. Renew spiriva. Use daily along with symbicort twice daily. Use rescue inhaler as needed and as prescribed . - tiotropium (SPIRIVA) 18 MCG inhalation capsule; Place 1 capsule (18 mcg total) into inhaler and inhale daily.  Dispense: 30 capsule; Refill: 12  4. Coronary artery disease due  to lipid rich plaque Stable. Doing well with entresto. Continue regular visits with cardiology as scheduled.    Hearing/Vision screen No results found.   Depression Screen PHQ 2/9 Scores 01/04/2022 12/14/2021 11/01/2015 03/17/2015 03/31/2014  PHQ - 2 Score 0 0 0 0 0  PHQ- 9 Score 0 1 - - -    Fall Risk Fall Risk  01/04/2022 12/14/2021  Falls in the past year? 0 0  Number falls in past yr: 0 0  Injury with Fall? 0 0  Follow up Falls evaluation completed Falls evaluation completed    FALL RISK PREVENTION PERTAINING TO THE HOME:  Any stairs in or around the home? Yes  If so, are there any without handrails? No  Home free of loose throw rugs in walkways, pet beds, electrical cords, etc? Yes  Adequate lighting in your home to reduce risk of falls? Yes   ASSISTIVE DEVICES UTILIZED TO PREVENT FALLS:  Life alert? No  Use of a cane, walker or w/c? Yes  Grab bars in the bathroom? Yes  Shower chair or bench in shower? No  Elevated toilet seat or a handicapped toilet? No   TIMED UP AND GO:  Was the test performed? Yes .  Length of time to ambulate 10 feet: 10 sec.   Gait slow and steady without use of assistive device  Cognitive Function:     6CIT Screen 01/04/2022  What Year? 0 points  What month? 0 points  What time? 0 points  Count back from 20 0 points  Months in reverse 0 points  Repeat phrase 0 points  Total Score 0    Immunizations Immunization History  Administered Date(s) Administered   Influenza,inj,Quad PF,6+ Mos 09/14/2015, 08/30/2017   Moderna Sars-Covid-2 Vaccination 02/03/2020, 03/02/2020, 11/25/2020   Pneumococcal Polysaccharide-23 12/12/2013, 08/30/2017    TDAP status: Due, Education has been provided regarding the importance of this vaccine. Advised may receive this vaccine at local pharmacy or Health Dept. Aware to provide a copy of the vaccination record if obtained from local pharmacy or Health Dept. Verbalized acceptance and understanding.  Flu Vaccine  status: Up to date  Pneumococcal vaccine status: Due, Education has been provided regarding the importance of this vaccine. Advised may receive this vaccine at local pharmacy or Health Dept. Aware to provide a copy of the vaccination record if obtained from local pharmacy or Health Dept. Verbalized acceptance and understanding.  Covid-19 vaccine status: Completed vaccines  Qualifies for Shingles Vaccine? Yes   Zostavax completed No   Shingrix Completed?: No.    Education has been provided regarding the importance of this vaccine. Patient has been advised to call insurance company to determine out of pocket expense if they have not yet received this vaccine. Advised may also receive vaccine at local  pharmacy or Health Dept. Verbalized acceptance and understanding.  Screening Tests Health Maintenance  Topic Date Due   Hepatitis C Screening  Never done   Zoster Vaccines- Shingrix (1 of 2) Never done   COLONOSCOPY (Pts 45-8yrs Insurance coverage will need to be confirmed)  Never done   Pneumonia Vaccine 57+ Years old (3 - PCV) 08/30/2018   INFLUENZA VACCINE  07/04/2021   COVID-19 Vaccine (4 - Booster for Moderna series) 01/20/2022 (Originally 01/20/2021)   TETANUS/TDAP  01/04/2023 (Originally 03/22/1975)   HIV Screening  Completed   HPV VACCINES  Aged Out    Health Maintenance  Health Maintenance Due  Topic Date Due   Hepatitis C Screening  Never done   Zoster Vaccines- Shingrix (1 of 2) Never done   COLONOSCOPY (Pts 45-29yrs Insurance coverage will need to be confirmed)  Never done   Pneumonia Vaccine 16+ Years old (3 - PCV) 08/30/2018   INFLUENZA VACCINE  07/04/2021    Colorectal cancer screening: Referral to GI placed 12/14/2021. Pt aware the office will call re: appt.  Lung Cancer Screening: (Low Dose CT Chest recommended if Age 46-80 years, 30 pack-year currently smoking OR have quit w/in 15years.) does not qualify.   Lung Cancer Screening Referral: no  Additional  Screening:  Hepatitis C Screening: does not qualify; Completed no  Vision Screening: Recommended annual ophthalmology exams for early detection of glaucoma and other disorders of the eye. Is the patient up to date with their annual eye exam?  Yes  Who is the provider or what is the name of the office in which the patient attends annual eye exams? [facility] If pt is not established with a provider, would they like to be referred to a provider to establish care? No .   Dental Screening: Recommended annual dental exams for proper oral hygiene  Community Resource Referral / Chronic Care Management: CRR required this visit?  No   CCM required this visit?  No      Plan:     I have personally reviewed and noted the following in the patients chart:   Medical and social history Use of alcohol, tobacco or illicit drugs  Current medications and supplements including opioid prescriptions. Patient is not currently taking opioid prescriptions. Functional ability and status Nutritional status Physical activity Advanced directives List of other physicians Hospitalizations, surgeries, and ER visits in previous 12 months Vitals Screenings to include cognitive, depression, and falls Referrals and appointments  In addition, I have reviewed and discussed with patient certain preventive protocols, quality metrics, and best practice recommendations. A written personalized care plan for preventive services as well as general preventive health recommendations were provided to patient.     Leretha Pol, FNP-c   01/04/2022

## 2022-01-06 ENCOUNTER — Other Ambulatory Visit: Payer: Self-pay

## 2022-01-06 DIAGNOSIS — E875 Hyperkalemia: Secondary | ICD-10-CM

## 2022-01-06 LAB — BASIC METABOLIC PANEL
BUN/Creatinine Ratio: 19 (ref 10–24)
BUN: 20 mg/dL (ref 8–27)
CO2: 24 mmol/L (ref 20–29)
Calcium: 9.7 mg/dL (ref 8.6–10.2)
Chloride: 100 mmol/L (ref 96–106)
Creatinine, Ser: 1.03 mg/dL (ref 0.76–1.27)
Glucose: 89 mg/dL (ref 70–99)
Potassium: 5.5 mmol/L — ABNORMAL HIGH (ref 3.5–5.2)
Sodium: 137 mmol/L (ref 134–144)
eGFR: 81 mL/min/{1.73_m2} (ref >59)

## 2022-01-12 ENCOUNTER — Ambulatory Visit (INDEPENDENT_AMBULATORY_CARE_PROVIDER_SITE_OTHER): Payer: Medicaid Other

## 2022-01-12 DIAGNOSIS — I255 Ischemic cardiomyopathy: Secondary | ICD-10-CM

## 2022-01-12 LAB — CUP PACEART REMOTE DEVICE CHECK
Date Time Interrogation Session: 20230209085313
Implantable Lead Implant Date: 20150515
Implantable Lead Location: 753860
Implantable Lead Model: 365
Implantable Lead Serial Number: 10579264
Implantable Pulse Generator Implant Date: 20150515
Pulse Gen Model: 383594
Pulse Gen Serial Number: 60800912

## 2022-01-13 ENCOUNTER — Encounter: Payer: Self-pay | Admitting: Physician Assistant

## 2022-01-13 ENCOUNTER — Ambulatory Visit (INDEPENDENT_AMBULATORY_CARE_PROVIDER_SITE_OTHER): Payer: Medicare Other | Admitting: Physician Assistant

## 2022-01-13 VITALS — BP 90/60 | HR 77 | Ht 73.0 in | Wt 228.0 lb

## 2022-01-13 DIAGNOSIS — Z95810 Presence of automatic (implantable) cardiac defibrillator: Secondary | ICD-10-CM

## 2022-01-13 DIAGNOSIS — Z951 Presence of aortocoronary bypass graft: Secondary | ICD-10-CM

## 2022-01-13 DIAGNOSIS — Z7901 Long term (current) use of anticoagulants: Secondary | ICD-10-CM

## 2022-01-13 DIAGNOSIS — Z1211 Encounter for screening for malignant neoplasm of colon: Secondary | ICD-10-CM

## 2022-01-13 DIAGNOSIS — I5021 Acute systolic (congestive) heart failure: Secondary | ICD-10-CM

## 2022-01-13 NOTE — Progress Notes (Signed)
Agree with assessment and plan as outlined.  

## 2022-01-13 NOTE — Progress Notes (Signed)
Chief Complaint: Discuss colonoscopy in a patient on chronic anticoagulation  HPI:     Kerry Mason is a 66 year old male with a past medical history of CAD status post stenting in 2000 and 2015 as well as 2016, COPD, status post defibrillator, CHF (01/04/2021 echo with LVEF 25-30%), MI and COPD, who was referred to me by [name], NP for consideration of a colonoscopy.    01/04/2021 attempted cardiac catheterization was unsuccessful, patient Reca to be on medical therapy for CAD and DOAC for A-fib.    12/14/2021 CBC and CMP normal.    12/22/2021 visit with cardiology for CHF.  At that time seem to be doing well.    Today, the patient presents to clinic and tells me that he does not think he has ever had screening for colon cancer.  He wants to have a colonoscopy.  Discussed that he is continuing to follow with cardiology but seems to be fairly stable at the moment.  Denies any chest pain, shortness of breath or acute GI complaints or concerns.    Denies fever, chills, weight loss, blood in his stool, change in bowel habits or symptoms of abdominal pain.  Past Medical History:  Diagnosis Date   Acute systolic CHF (congestive heart failure) (HCC)    AICD (automatic cardioverter/defibrillator) present    Anxiety    Ascending aortic aneurysm 08/31/2017   Back pain 08/31/2017   Basal cell carcinoma    left arm   CAD (coronary artery disease)    a. 2000: s/p stent of RCA 2000 with BMS  b. 2015: STEMI s/p LHC with old occlusion of RCA and DESx2 to LAD  c. 05/05/43: complicated PCI on 3/2 for CTO of mid RCA with coronary perforation and cardiac tamponade requiring emergent pericardiocentesis      Cardiac tamponade    a. 02/03/15 2/2 coronary perforation during CTO procedure. Sealed with graftmaster coated stent.    COPD (chronic obstructive pulmonary disease) ([place]) 08/31/2017   Gout    History of pneumonia    HTN (hypertension)    Hyperlipidemia    Ischemic cardiomyopathy    a. 2D ECHO: EF  25-30%. 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   LIMA to LAD, SVG to ramus intermediate branch, EVH via right thigh    Shortness of breath dyspnea    Tobacco abuse    Urinary frequency     Past Surgical History:  Procedure Laterality Date   CARDIAC CATHETERIZATION     CARDIAC CATHETERIZATION N/A 06/14/2015   Procedure: Left Heart Cath and Coronary Angiography;  Surgeon: Lorretta Harp, MD;  Location: [facility] CV LAB;  Service: Cardiovascular;  Laterality: N/A;   CARDIAC CATHETERIZATION N/A 08/18/2015   Procedure: Intravascular Pressure Wire/FFR Study;  Surgeon: Peter M Martinique, MD;  Location: [place] CV LAB;  Service: Cardiovascular;  Laterality: N/A;   COLONOSCOPY     CORONARY ANGIOPLASTY  2000   CORONARY ANGIOPLASTY WITH STENT PLACEMENT  2015   CORONARY ARTERY BYPASS GRAFT N/A 09/02/2015   Procedure: CORONARY ARTERY BYPASS GRAFTING (CABG) x 2 (LIMA-LAD, SVG-Intermediate) ENDOSCOPIC GREATER SAPHENOUS VEIN HARVEST RIGHT THIGH;  Surgeon: Rexene Alberts, MD;  Location: [place];  Service: Open Heart Surgery;  Laterality: N/A;   CORONARY BALLOON ANGIOPLASTY N/A 01/04/2021   Procedure: CORONARY BALLOON ANGIOPLASTY;  Surgeon: Jettie Booze, MD;  Location: [facility]  INVASIVE CV LAB;  Service: Cardiovascular;  Laterality: N/A;   FOOT SURGERY     IMPLANTABLE CARDIOVERTER DEFIBRILLATOR IMPLANT N/A 04/17/2014   Procedure: IMPLANTABLE CARDIOVERTER DEFIBRILLATOR IMPLANT;  Surgeon: Evans Lance, MD;  Location: [facility] CATH LAB;  Service: Cardiovascular;  Laterality: N/A;   LEFT HEART CATH AND CORONARY ANGIOGRAPHY N/A 01/04/2021   Procedure: LEFT HEART CATH AND CORONARY ANGIOGRAPHY;  Surgeon: Jettie Booze, MD;  Location: [place] CV LAB;  Service: Cardiovascular;  Laterality: N/A;   LEFT HEART CATH AND CORS/GRAFTS ANGIOGRAPHY N/A 08/30/2017   Procedure: LEFT HEART CATH AND CORS/GRAFTS ANGIOGRAPHY;   Surgeon: Nelva Bush, MD;  Location: [place] CV LAB;  Service: Cardiovascular;  Laterality: N/A;   LEFT HEART CATHETERIZATION WITH CORONARY ANGIOGRAM N/A 12/11/2013   Procedure: LEFT HEART CATHETERIZATION WITH CORONARY ANGIOGRAM;  Surgeon: Peter M Martinique, MD;  Location: [facility] CATH LAB;  Service: Cardiovascular;  Laterality: N/A;   LEFT HEART CATHETERIZATION WITH CORONARY ANGIOGRAM N/A 01/05/2015   Procedure: LEFT HEART CATHETERIZATION WITH CORONARY ANGIOGRAM;  Surgeon: Peter M Martinique, MD;  Location: [facility] CATH LAB;  Service: Cardiovascular;  Laterality: N/A;   PERCUTANEOUS CORONARY STENT INTERVENTION (PCI-S)  12/11/2013   Procedure: PERCUTANEOUS CORONARY STENT INTERVENTION (PCI-S);  Surgeon: Peter M Martinique, MD;  Location: [facility] CATH LAB;  Service: Cardiovascular;;  prov LAD and mid LAD   PERCUTANEOUS CORONARY STENT INTERVENTION (PCI-S) N/A 02/03/2015   Procedure: PERCUTANEOUS CORONARY STENT INTERVENTION (PCI-S);  Surgeon: Jettie Booze, MD;  Location: [facility] CATH LAB;  Service: Cardiovascular;  Laterality: N/A;   TEE WITHOUT CARDIOVERSION N/A 09/02/2015   Procedure: TRANSESOPHAGEAL ECHOCARDIOGRAM (TEE);  Surgeon: Rexene Alberts, MD;  Location: [place];  Service: Open Heart Surgery;  Laterality: N/A;    Current Outpatient Medications  Medication Sig Dispense Refill   acetaminophen (TYLENOL) 325 MG tablet Take 2 tablets (650 mg total) by mouth every 4 (four) hours as needed for headache or mild pain.     allopurinol (ZYLOPRIM) 300 MG tablet Take 1 tablet (300 mg total) by mouth 2 (two) times daily. 180 tablet 3   apixaban (ELIQUIS) 5 MG TABS tablet Take 1 tablet (5 mg total) by mouth 2 (two) times daily. 60 tablet 11   aspirin EC 81 MG tablet Take 81 mg by mouth daily.     atorvastatin (LIPITOR) 80 MG tablet Take 1 tablet (80 mg total) by mouth daily. 90 tablet 3   budesonide-formoterol (SYMBICORT) 160-4.5 MCG/ACT inhaler Inhale 2 puffs into the lungs 2 (two) times daily. 1 each 6   buPROPion (WELLBUTRIN  XL) 150 MG 24 hr tablet TAKE 1 TABLET BY MOUTH EVERY DAY 90 tablet 0   carvedilol (COREG) 6.25 MG tablet Take 1 tablet (6.25 mg total) by mouth 2 (two) times daily. 180 tablet 3   cholecalciferol (VITAMIN D3) 25 MCG (1000 UNIT) tablet Take 1,000 Units by mouth daily.     colchicine 0.6 MG tablet Take 2 tablet po one time.  Then take 1 tablet after one hour. Then take 1 tablet daily for 5 days. 8 tablet 2   ezetimibe (ZETIA) 10 MG tablet Take 1 tablet (10 mg total) by mouth daily. 90 tablet 3   furosemide (LASIX) 40 MG tablet Take 1 tablet (40 mg total) by mouth daily. 90 tablet 1   isosorbide mononitrate (IMDUR) 30 MG 24 hr tablet Take 1 tablet (30 mg total) by mouth daily. 90 tablet 3   ketorolac (ACULAR) 0.5 % ophthalmic solution 1 drop 4 (four) times daily.  Magnesium Oxide 200 MG TABS Take 1 tablet (200 mg total) by mouth 2 (two) times daily. (Patient not taking: Reported on 12/22/2021) 60 tablet 0   Na Sulfate-K Sulfate-Mg Sulf 17.5-3.13-1.6 GM/177ML SOLN SMARTSIG:354 Milliliter(s) By Mouth Once     nitroGLYCERIN (NITROSTAT) 0.4 MG SL tablet DISSOLVE ONE TABLET UNDER THE TONGUE EVERY 5 MINUTES AS NEEDED FOR CHEST PAIN.  DO NOT EXCEED A TOTAL OF 3 DOSES IN 15 MINUTES 25 tablet 2   ofloxacin (OCUFLOX) 0.3 % ophthalmic solution Place 1 drop into both eyes in the morning and at bedtime.     prednisoLONE acetate (PRED FORTE) 1 % ophthalmic suspension Place 1 drop into the right eye 2 (two) times daily.     sacubitril-valsartan (ENTRESTO) 24-26 MG Take 1 tablet by mouth 2 (two) times daily. 60 tablet 6   sildenafil (VIAGRA) 50 MG tablet TAKE 1 TABLET BY MOUTH DAILY AS NEEDED FOR ERECTILE DYSFUNCTION. 10 tablet 3   spironolactone (ALDACTONE) 25 MG tablet Take 1/2 tablet ( 12.5 mg ) daily     tiotropium (SPIRIVA) 18 MCG inhalation capsule Place 1 capsule (18 mcg total) into inhaler and inhale daily. 30 capsule 12   Current Facility-Administered Medications  Medication Dose Route Frequency Provider  Last Rate Last Admin   nicotine (NICODERM CQ - dosed in mg/24 hr) patch 14 mg  14 mg Transdermal Daily Duke, Tami Lin, PA        Allergies as of 01/13/2022   (No Known Allergies)    Family History  Problem Relation Age of Onset   CAD Father        PTCA   Cancer Mother        LYMPHOMA    Social History   Socioeconomic History   Marital status: Divorced    Spouse name: Not on file   Number of children: 2   Years of education: Not on file   Highest education level: Not on file  Occupational History   Occupation: Pension scheme manager auction  Tobacco Use   Smoking status: Every Day    Packs/day: 0.50    Years: 35.00    Pack years: 17.50    Types: Cigarettes   Smokeless tobacco: Never  Vaping Use   Vaping Use: Never used  Substance and Sexual Activity   Alcohol use: No    Alcohol/week: 0.0 standard drinks   Drug use: No   Sexual activity: Not Currently  Other Topics Concern   Not on file  Social History Narrative   Not on file   Social Determinants of Health   Financial Resource Strain: Not on file  Food Insecurity: Not on file  Transportation Needs: Not on file  Physical Activity: Not on file  Stress: Not on file  Social Connections: Not on file  Intimate Partner Violence: Not on file    Review of Systems:    Constitutional: No weight loss, fever or chills Skin: No rash  Cardiovascular: No chest pain Respiratory: No SOB  Gastrointestinal: See HPI and otherwise negative Genitourinary: No dysuria  Neurological: No headache, dizziness or syncope Musculoskeletal: No new muscle or joint pain Hematologic: No bleeding or bruising Psychiatric: No history of depression or anxiety   Physical Exam:  Vital signs: BP 90/60    Pulse 77    Ht 6\' 1"  (1.854 m)    Wt 228 lb (103.4 kg)    BMI 30.08 kg/m    Constitutional:   Pleasant Caucasian male appears to be in NAD, Well developed, Well  nourished, alert and cooperative Head:  Normocephalic and atraumatic. Eyes:    PEERL, EOMI. No icterus. Conjunctiva pink. Ears:  Normal auditory acuity. Neck:  Supple Throat: Oral cavity and pharynx without inflammation, swelling or lesion.  Respiratory: Respirations even and unlabored. Lungs clear to auscultation bilaterally.   No wheezes, crackles, or rhonchi.  Cardiovascular: Normal S1, S2. No MRG. Regular rate and rhythm. No peripheral edema, cyanosis or pallor.  Gastrointestinal:  Soft, nondistended, nontender. No rebound or guarding. Normal bowel sounds. No appreciable masses or hepatomegaly. Rectal:  Not performed.  Msk:  Symmetrical without gross deformities. Without edema, no deformity or joint abnormality.  Neurologic:  Alert and  oriented x4;  grossly normal neurologically.  Skin:   Dry and intact without significant lesions or rashes. Psychiatric: Demonstrates good judgement and reason without abnormal affect or behaviors.  RELEVANT LABS AND IMAGING: CBC    Component Value Date/Time   WBC 9.4 12/14/2021 0919   WBC 11.9 (H) 11/24/2021 0918   RBC 5.23 12/14/2021 0919   RBC 4.97 11/24/2021 0918   HGB 15.3 12/14/2021 0919   HCT 46.0 12/14/2021 0919   PLT 307 12/14/2021 0919   MCV 88 12/14/2021 0919   MCH 29.3 12/14/2021 0919   MCH 30.4 11/24/2021 0918   MCHC 33.3 12/14/2021 0919   MCHC 33.9 11/24/2021 0918   RDW 13.6 12/14/2021 0919   LYMPHSABS 3.7 (H) 07/15/2020 1353   MONOABS 0.6 12/24/2015 0909   EOSABS 0.4 07/15/2020 1353   BASOSABS 0.1 07/15/2020 1353    CMP     Component Value Date/Time   NA 137 01/05/2022 1256   K 5.5 (H) 01/05/2022 1256   CL 100 01/05/2022 1256   CO2 24 01/05/2022 1256   GLUCOSE 89 01/05/2022 1256   GLUCOSE 144 (H) 11/24/2021 0918   BUN 20 01/05/2022 1256   CREATININE 1.03 01/05/2022 1256   CREATININE 0.93 12/24/2015 0909   CALCIUM 9.7 01/05/2022 1256   PROT 7.4 12/14/2021 0919   ALBUMIN 4.2 12/14/2021 0919   AST 20 12/14/2021 0919   ALT 17 12/14/2021 0919   ALKPHOS 94 12/14/2021 0919   BILITOT 0.5  12/14/2021 0919   GFRNONAA >60 11/24/2021 0918   GFRAA 99 01/18/2021 1519    Assessment: 1.  Screening for colorectal cancer: Patient has never had screening for colorectal cancer 2.  History of CAD status post CABG: On Eliquis 3.  CHF: Last EF 2022 was 25-30% 4.  Status post ICD  Plan: 1.  We discussed other screening modalities today including Cologuard.  Patient is leery that any one of these could have a false negative rate.  He wants to have a colonoscopy.  Explained that due to his ejection fraction this will need to be in the hospital.  Did explain that we are running 3 to 4 months out on the screening procedures.  He verbalized understanding.  Patient was placed on a wait list for screening colonoscopy at the hospital due to decreased ejection fraction. 2.  While we are waiting to schedule patient's procedure we will go ahead and send cardiac clearance to Dr. Martinique.  Also recommend the patient hold his Eliquis for 2 days prior to time of the procedure.  We will communicate that with Dr. Martinique as well and ensure this is acceptable for him. 3.  Patient to follow in clinic per recommendations after procedure above. Assigned to Dr. Havery Moros today.  Ellouise Newer, PA-C [facility] 01/13/2022, 10:34 AM  Cc: [name], NP

## 2022-01-13 NOTE — Patient Instructions (Signed)
We will contact you when an opening comes up at the hospital for a colonoscopy. At this time all slots are booked through the month of April.  BMI:  If you are age 66 or older, your body mass index should be between 23-30. Your Body mass index is 30.08 kg/m. If this is out of the aforementioned range listed, please consider follow up with your Primary Care Provider.  If you are age 93 or younger, your body mass index should be between 19-25. Your Body mass index is 30.08 kg/m. If this is out of the aformentioned range listed, please consider follow up with your Primary Care Provider.   MY CHART:  The  GI providers would like to encourage you to use [facility] to communicate with providers for non-urgent requests or questions.  Due to long hold times on the telephone, sending your provider a message by [facility] may be a faster and more efficient way to get a response.  Please allow 48 business hours for a response.  Please remember that this is for non-urgent requests.   Thank you for trusting me with your gastrointestinal care!    Ellouise Newer, [place]

## 2022-01-17 NOTE — Progress Notes (Signed)
Remote ICD transmission.   

## 2022-01-19 ENCOUNTER — Ambulatory Visit: Payer: Medicare Other

## 2022-01-21 ENCOUNTER — Other Ambulatory Visit: Payer: Self-pay | Admitting: Cardiology

## 2022-01-23 NOTE — Telephone Encounter (Signed)
This is Dr. Jordan's pt. °

## 2022-01-24 ENCOUNTER — Other Ambulatory Visit: Payer: Self-pay | Admitting: Urology

## 2022-01-24 ENCOUNTER — Telehealth: Payer: Self-pay | Admitting: Cardiology

## 2022-01-24 NOTE — Telephone Encounter (Signed)
° °  Pre-operative Risk Assessment    Patient Name: Kerry Mason  DOB: [birth year] MRN: [id number]      Request for Surgical Clearance    Procedure:   right spermatocelectomy  Date of Surgery:  Clearance 02/21/22                                 Surgeon:  Dr. Sherrell Puller Group or Practice Name:  [facility] Phone number:  [phone] Fax number:  [phone]   Type of Clearance Requested:   - Medical  - Pharmacy:  Hold Apixaban (Eliquis) and Aspirin - Up to Cardiology how many days prior   Type of Anesthesia:  General    Additional requests/questions:    Justin Mend   01/24/2022, 3:47 PM

## 2022-01-25 NOTE — Telephone Encounter (Signed)
° °  Patient Name: Kerry Mason  DOB: Apr 22, 1956 MRN: [id number]  Primary Cardiologist: Peter Martinique, MD  Chart reviewed as part of pre-operative protocol coverage.  66 year old male with a history of CAD s/p CABG x2 and multiple PCI's, chronic systolic heart failure s/p ICD (EF 30-35%), paroxysmal atrial fibrillation on Eliquis, AAA, hypertension, hyperlipidemia and tobacco use.   Cath in September 2018 showed widely patent LIMA to LAD and SVG to ramus, moderate to severely reduced left ventricular contraction, multiple abnormal vessels arising from the LIMA graft. Most recent cath in February 2022 resulted in unsuccessful PCI of ostia LCx. Medical therapy was recommended.   Follows with [facility] for AAA, most recently measuring 5.1 cm.  He is due for repeat imaging in June 2023.  Received surgical clearance request for right spermatocelectomy scheduled for 02/21/2022. Surgeon is requesting to hold Eliquis and aspirin prior to surgery. Per pharmacy protocol, patient may hold Eliquis for 2 days prior to procedure.  Dr. Martinique, please advise on holding aspirin prior to surgery.  Thank you.   Lenna Sciara, NP 01/25/2022, 10:30 AM

## 2022-01-25 NOTE — Telephone Encounter (Signed)
Patient with diagnosis of A Fib on Eliquis for anticoagulation.    Procedure:  right spermatocelectomy Date of procedure: 02/21/22   CHA2DS2-VASc Score = 4  This indicates a 4.8% annual risk of stroke. The patient's score is based upon: CHF History: 1 HTN History: 1 Diabetes History: 0 Stroke History: 0 Vascular Disease History: 1 Age Score: 1 Gender Score: 0   CrCl 191mL/min Platelet count 307K  Per office protocol, patient can hold Eliquis for 2 days prior to procedure.

## 2022-01-27 NOTE — Telephone Encounter (Signed)
° °  Primary Cardiologist: Peter Martinique, MD  Chart reviewed as part of pre-operative protocol coverage. Given past medical history and time since last visit, based on ACC/AHA guidelines, Kerry Mason would be at acceptable risk for the planned procedure without further cardiovascular testing.   Patient with diagnosis of A Fib on Eliquis for anticoagulation.     Procedure:  right spermatocelectomy Date of procedure: 02/21/22     CHA2DS2-VASc Score = 4  This indicates a 4.8% annual risk of stroke. The patient's score is based upon: CHF History: 1 HTN History: 1 Diabetes History: 0 Stroke History: 0 Vascular Disease History: 1 Age Score: 1 Gender Score: 0   CrCl 156mL/min Platelet count 307K   Per office protocol, patient can hold Eliquis for 2 days prior to procedure.    His aspirin will need to be continued throughout his surgery without interruption.  I will route this recommendation to the requesting party via Epic fax function and remove from pre-op pool.  Please call with questions.  Jossie Ng. Kerry Tietje NP-C    01/27/2022, 9:14 AM [place] [place] [address] Office [phone] Fax [phone]

## 2022-01-31 ENCOUNTER — Encounter: Payer: Self-pay | Admitting: Cardiology

## 2022-02-07 NOTE — Progress Notes (Addendum)
Anesthesia Review:  PCP: Leretha Pol, NP  LOV 12/14/21  Cardiologist : DR Peter Martinique  LOV 11/11/21  Clearance 01/24/22- Jesse Cleaver,NP  Chest x-ray : EKG : 11/25/21  Last Device Check- 11/11/21  Echo : 01/04/21  11/07/21- Vascular Studies  Stress test: Cardiac Cath :  01/24/21  Activity level:  Sleep Study/ CPAP : Fasting Blood Sugar :      / Checks Blood Sugar -- times a day:   Blood Thinner/ Instructions /Last Dose: ASA / Instructions/ Last Dose :   Called pt at 200pm when not at preop appt.  Pt stated he was at cardiology office of DR Martinique having some labs done and seeing MD.  Instructed pt to call [phone] when completed appt at [facility] and we would review med hx and preop instructionsi via phone.  PT voiced understanding.   Eliqiuis-  81 mg aspirin  11/24/21- IN ED for dizziness  Completed med hx via phone on 02/13/22.  Pt to run a few errands and then call preop nurse once he arrives home to review preop instructions.  PT to call preop nurse at [phone].

## 2022-02-07 NOTE — Progress Notes (Addendum)
DUE TO COVID-19 ONLY ONE VISITOR IS ALLOWED TO COME WITH YOU AND STAY IN THE WAITING ROOM ONLY DURING PRE OP AND PROCEDURE DAY OF SURGERY.  2 VISITOR  MAY VISIT WITH YOU AFTER SURGERY IN YOUR PRIVATE ROOM DURING VISITING HOURS ONLY! YOU MAY HAVE ONE PERSON SPEND THE NITE WITH YOU IN YOUR ROOM AFTER SURGERY.       Your procedure is scheduled on:               02/21/22   Report to [facility] Main  Entrance   Report to admitting at     0745             AM DO NOT [place], PICTURE ID OR WALLET DAY OF SURGERY.      Call this number if you have problems the morning of surgery [phone]    REMEMBER: NO  SOLID FOODS , CANDY, GUM OR MINTS AFTER [place] .       Marland Kitchen CLEAR LIQUIDS UNTIL     0700AM            DAY OF SURGERY.       CLEAR LIQUID DIET   Foods Allowed      WATER BLACK COFFEE ( SUGAR OK, NO MILK, CREAM OR CREAMER) REGULAR AND DECAF  TEA ( SUGAR OK NO MILK, CREAM, OR CREAMER) REGULAR AND DECAF  PLAIN JELLO ( NO RED)  FRUIT ICES ( NO RED, NO FRUIT PULP)  POPSICLES ( NO RED)  JUICE- APPLE, WHITE GRAPE AND WHITE CRANBERRY  SPORT DRINK LIKE GATORADE ( NO RED)  CLEAR BROTH ( VEGETABLE , CHICKEN OR BEEF)                                                                     BRUSH YOUR TEETH MORNING OF SURGERY AND RINSE YOUR MOUTH OUT, NO CHEWING GUM CANDY OR MINTS.     Take these medicines the morning of surgery with A SIP OF WATER:   allopurinol, inhalers as usual and bring, eye drops as usual, wellbutrin , coreg, imdur    DO NOT TAKE ANY DIABETIC MEDICATIONS DAY OF YOUR SURGERY                               You may not have any metal on your body including hair pins and              piercings  Do not wear jewelry, make-up, lotions, powders or perfumes, deodorant             Do not wear nail polish on your fingernails.              IF YOU ARE A MALE AND WANT TO SHAVE UNDER ARMS OR LEGS PRIOR TO SURGERY YOU MUST DO SO AT LEAST 48 HOURS  PRIOR TO SURGERY.              Men may shave face and neck.   Do not bring valuables to the hospital. [place].  Contacts, dentures or  bridgework may not be worn into surgery.  Leave suitcase in the car. After surgery it may be brought to your room.     Patients discharged the day of surgery will not be allowed to drive home. IF YOU ARE HAVING SURGERY AND GOING HOME THE SAME DAY, YOU MUST HAVE AN ADULT TO DRIVE YOU HOME AND BE WITH YOU FOR 24 HOURS. YOU MAY GO HOME BY TAXI OR UBER OR ORTHERWISE, BUT AN ADULT MUST ACCOMPANY YOU HOME AND STAY WITH YOU FOR 24 HOURS.                Please read over the following fact sheets you were given: _____________________________________________________________________  [facility] - Preparing for Surgery Before surgery, you can play an important role.  Because skin is not sterile, your skin needs to be as free of germs as possible.  You can reduce the number of germs on your skin by washing with CHG (chlorahexidine gluconate) soap before surgery.  CHG is an antiseptic cleaner which kills germs and bonds with the skin to continue killing germs even after washing. Please DO NOT use if you have an allergy to CHG or antibacterial soaps.  If your skin becomes reddened/irritated stop using the CHG and inform your nurse when you arrive at Short Stay. Do not shave (including legs and underarms) for at least 48 hours prior to the first CHG shower.  You may shave your face/neck. Please follow these instructions carefully:  1.  Shower with CHG Soap the night before surgery and the  morning of Surgery.  2.  If you choose to wash your hair, wash your hair first as usual with your  normal  shampoo.  3.  After you shampoo, rinse your hair and body thoroughly to remove the  shampoo.                           4.  Use CHG as you would any other liquid soap.  You can apply chg directly  to the skin and wash                        Gently with a scrungie or clean washcloth.  5.  Apply the CHG Soap to your body ONLY FROM THE NECK DOWN.   Do not use on face/ open                           Wound or open sores. Avoid contact with eyes, ears mouth and genitals (private parts).                       Wash face,  Genitals (private parts) with your normal soap.             6.  Wash thoroughly, paying special attention to the area where your surgery  will be performed.  7.  Thoroughly rinse your body with warm water from the neck down.  8.  DO NOT shower/wash with your normal soap after using and rinsing off  the CHG Soap.                9.  Pat yourself dry with a clean towel.            10.  Wear clean pajamas.            11.  Place clean sheets  on your bed the night of your first shower and do not  sleep with pets. Day of Surgery : Do not apply any lotions/deodorants the morning of surgery.  Please wear clean clothes to the hospital/surgery center.  FAILURE TO FOLLOW THESE INSTRUCTIONS MAY RESULT IN THE CANCELLATION OF YOUR SURGERY PATIENT SIGNATURE_________________________________  NURSE SIGNATURE__________________________________  ________________________________________________________________________

## 2022-02-13 ENCOUNTER — Encounter (HOSPITAL_COMMUNITY): Payer: Self-pay

## 2022-02-13 ENCOUNTER — Other Ambulatory Visit: Payer: Self-pay

## 2022-02-13 ENCOUNTER — Encounter (HOSPITAL_COMMUNITY)
Admission: RE | Admit: 2022-02-13 | Discharge: 2022-02-13 | Disposition: A | Discharge status: Home / Self Care | Payer: Medicare Other | Source: Ambulatory Visit | Attending: Urology | Admitting: Urology

## 2022-02-13 DIAGNOSIS — Z7901 Long term (current) use of anticoagulants: Secondary | ICD-10-CM | POA: Insufficient documentation

## 2022-02-13 DIAGNOSIS — N4341 Spermatocele of epididymis, single: Secondary | ICD-10-CM | POA: Diagnosis not present

## 2022-02-13 DIAGNOSIS — I4891 Unspecified atrial fibrillation: Secondary | ICD-10-CM | POA: Insufficient documentation

## 2022-02-13 DIAGNOSIS — Z951 Presence of aortocoronary bypass graft: Secondary | ICD-10-CM | POA: Diagnosis not present

## 2022-02-13 DIAGNOSIS — I255 Ischemic cardiomyopathy: Secondary | ICD-10-CM | POA: Insufficient documentation

## 2022-02-13 DIAGNOSIS — Z95810 Presence of automatic (implantable) cardiac defibrillator: Secondary | ICD-10-CM | POA: Insufficient documentation

## 2022-02-13 DIAGNOSIS — I110 Hypertensive heart disease with heart failure: Secondary | ICD-10-CM | POA: Insufficient documentation

## 2022-02-13 DIAGNOSIS — Z01818 Encounter for other preprocedural examination: Secondary | ICD-10-CM

## 2022-02-13 DIAGNOSIS — Z01812 Encounter for preprocedural laboratory examination: Secondary | ICD-10-CM | POA: Diagnosis present

## 2022-02-13 DIAGNOSIS — F17210 Nicotine dependence, cigarettes, uncomplicated: Secondary | ICD-10-CM | POA: Diagnosis not present

## 2022-02-13 DIAGNOSIS — J449 Chronic obstructive pulmonary disease, unspecified: Secondary | ICD-10-CM | POA: Diagnosis not present

## 2022-02-13 DIAGNOSIS — I5022 Chronic systolic (congestive) heart failure: Secondary | ICD-10-CM | POA: Insufficient documentation

## 2022-02-13 DIAGNOSIS — I2510 Atherosclerotic heart disease of native coronary artery without angina pectoris: Secondary | ICD-10-CM | POA: Insufficient documentation

## 2022-02-14 ENCOUNTER — Encounter: Payer: Self-pay | Admitting: Internal Medicine

## 2022-02-14 LAB — BASIC METABOLIC PANEL
BUN/Creatinine Ratio: 22 (ref 10–24)
BUN: 19 mg/dL (ref 8–27)
CO2: 24 mmol/L (ref 20–29)
Calcium: 10 mg/dL (ref 8.6–10.2)
Chloride: 98 mmol/L (ref 96–106)
Creatinine, Ser: 0.88 mg/dL (ref 0.76–1.27)
Glucose: 85 mg/dL (ref 70–99)
Potassium: 4.7 mmol/L (ref 3.5–5.2)
Sodium: 137 mmol/L (ref 134–144)
eGFR: 95 mL/min/{1.73_m2} (ref >59)

## 2022-02-14 NOTE — Progress Notes (Signed)
PERIOPERATIVE PRESCRIPTION FOR IMPLANTED CARDIAC DEVICE PROGRAMMING ? ?Patient Information: ?Name:  Kerry Mason  ?DOB:  14-Apr-1956  ?MRN:  [id number]  ?  ?Planned Procedure:  right spermatocelectomy  ?Surgeon:  Dr Reginia Forts  ?Date of Procedure:   02/21/22  ?Cautery will be used.  ?Position during surgery:  ? ?Please send documentation back to:  ?[facility] (Fax # [phone])  ?Device Information: ? ?Clinic EP Physician:  Cristopher Peru, MD  ? ?Device Type:  Defibrillator ?Manufacturer and Phone #:  [employer]: [phone] ?Pacemaker Dependent?:  No ?Date of Last Device Check:  01/12/2022 Normal Device Function?:  Yes.   ? ?Electrophysiologist's Recommendations: ? ?Have magnet available. ?Provide continuous ECG monitoring when magnet is used or reprogramming is to be performed.  ?Procedure should not interfere with device function.  No device programming or magnet placement needed. ? ?Per Device Clinic Standing Orders, ?Wanda Plump, RN  ?2:25 PM 02/14/2022  ?

## 2022-02-15 ENCOUNTER — Other Ambulatory Visit: Payer: Self-pay | Admitting: Cardiology

## 2022-02-15 ENCOUNTER — Other Ambulatory Visit (HOSPITAL_COMMUNITY): Payer: Medicare Other

## 2022-02-15 ENCOUNTER — Telehealth: Payer: Self-pay

## 2022-02-15 NOTE — Progress Notes (Signed)
Anesthesia Chart Review ? ? Case: [id number] Date/Time: 02/21/22 0945  ? Procedure: RIGHT SPERMATOCELECTOMY (Right)  ? Anesthesia type: General  ? Pre-op diagnosis: RIGHT SPERMATOCELE  ? Location: [facility] / [facility]  ? Surgeons: Irine Seal, MD  ? ?  ? ? ?DISCUSSION:65 y.o. smoker with h/o HTN, CAD (CABG), ischemic cardiomyopathy with EF 25-30%, AICD in place (device orders in 02/14/2022 progress note), a-fib (on Eliquis), COPD, right spermatocele scheduled for above procedure 02/21/2022 with Dr. Irine Seal.  ? ?Per cardiology preoperative evaluation 01/27/2022, "Chart reviewed as part of pre-operative protocol coverage. Given past medical history and time since last visit, based on ACC/AHA guidelines, Kerry Mason would be at acceptable risk for the planned procedure without further cardiovascular testing.  ?  ?Patient with diagnosis of A Fib on Eliquis for anticoagulation.  " ? ?Pt advised to hold Eliquis 2 days prior to procedure.  ? ?Anticipate pt can proceed with planned procedure barring acute status change.   ?VS: There were no vitals taken for this visit. ? ?PROVIDERS: ?[name], NP is PCP  ? ?Primary Cardiologist: Peter Martinique, MD ? ?LABS: Labs reviewed: Acceptable for surgery. ?(all labs ordered are listed, but only abnormal results are displayed) ? ?Labs Reviewed - No data to display ? ? ?IMAGES: ? ? ?EKG: ?11/25/2021 ?Rate 71 bpm  ?NSR ?RAD ?Anteroseptal infarct age undetermined ? ?CV: ?Cardiac Cath 01/04/21 ?LM lesion is 50% stenosed. Patent LIMA to LAD. Patent SVG to ramus. ?2nd Diag lesion is 70% stenosed. ?Mid RCA to Dist RCA lesion is 100% stenosed. ?Prox RCA to Mid RCA lesion is 100% stenosed. ?Ost RCA to Prox RCA lesion is 95% stenosed. ?Ost Cx lesion is 80% stenosed. This territory is not bypassed, but it is a relatively small vessel. Unable to advance balloon after successfully guiding the wire though the ramus graft and retrograde to the ostial circumflex and across the lesion. ?The  left ventricular ejection fraction is 25-35% by visual estimate. ?There is moderate to severe left ventricular systolic dysfunction. ?LV end diastolic pressure is normal. ?There is no aortic valve stenosis. ?  ?Medical therapy for CAD.  Unsuccessful attempt at PCI of ostial circumflex.  Restart IV heparin 8 hours post sheath pull.  OK to start [place] tomorrow for atrial fibrillation.  He was loaded with Plavix but will not continue if he is starting a DOAC.  If he is not starting a DOAC, DAPT would be reasonable for his CAD.   ?  ? ?Echo 01/04/21 ?1. There is no left ventricular thrombus (Definity contrast was used).  ?There is apical dyskinesis. There is severe inferior wall hypokinesis and  ?inferoseptal akinesis.. Left ventricular ejection fraction, by estimation,  ?is 25 to 30%. The left ventricle  ?has severely decreased function. The left ventricle demonstrates regional  ?wall motion abnormalities (see scoring diagram/findings for description).  ?The left ventricular internal cavity size was moderately to severely  ?dilated. Left ventricular diastolic  ?parameters are consistent with Grade I diastolic dysfunction (impaired  ?relaxation).  ? 2. Right ventricular systolic function is normal. The right ventricular  ?size is normal. There is normal pulmonary artery systolic pressure. The  ?estimated right ventricular systolic pressure is 50.3 mmHg.  ? 3. Left atrial size was severely dilated.  ? 4. The mitral valve is normal in structure. Trivial mitral valve  ?regurgitation.  ? 5. The aortic valve is tricuspid. There is mild calcification of the  ?aortic valve. There is mild thickening of the aortic valve. Aortic  valve  ?regurgitation is not visualized. Mild aortic valve sclerosis is present,  ?with no evidence of aortic valve  ?stenosis.  ? 6. Aortic dilatation noted. There is mild dilatation of the aortic root,  ?measuring 39 mm.  ? 7. The inferior vena cava is normal in size with greater than 50%  ?respiratory  variability, suggesting right atrial pressure of 3 mmHg. ?Past Medical History:  ?Diagnosis Date  ? Acute systolic CHF (congestive heart failure) ([place])   ? AICD (automatic cardioverter/defibrillator) present   ? Anxiety   ? Ascending aortic aneurysm 08/31/2017  ? Back pain 08/31/2017  ? Basal cell carcinoma   ? left arm  ? CAD (coronary artery disease)   ? a. 2000: s/p stent of RCA 2000 with BMS  b. 2015: STEMI s/p LHC with old occlusion of RCA and DESx2 to LAD  c. 02/06/44: complicated PCI on 3/2 for CTO of mid RCA with coronary perforation and cardiac tamponade requiring emergent pericardiocentesis     ? Cardiac tamponade   ? a. 02/03/15 2/2 coronary perforation during CTO procedure. Sealed with graftmaster coated stent.   ? COPD (chronic obstructive pulmonary disease) ([place]) 08/31/2017  ? Gout   ? History of pneumonia   ? HTN (hypertension)   ? Hyperlipidemia   ? Ischemic cardiomyopathy   ? a. 2D ECHO: EF 25-30%. Akinesis of the anteroseptal and  ? Left main coronary artery disease   ? Myocardial infarction [facility])   ? Obesity   ? S/P CABG x 2 09/02/2015  ? LIMA to LAD, SVG to ramus intermediate branch, EVH via right thigh   ? Shortness of breath dyspnea   ? Tobacco abuse   ? Urinary frequency   ? ? ?Past Surgical History:  ?Procedure Laterality Date  ? CARDIAC CATHETERIZATION    ? CARDIAC CATHETERIZATION N/A 06/14/2015  ? Procedure: Left Heart Cath and Coronary Angiography;  Surgeon: Lorretta Harp, MD;  Location: [place] [facility];  Service: Cardiovascular;  Laterality: N/A;  ? CARDIAC CATHETERIZATION N/A 08/18/2015  ? Procedure: Intravascular Pressure Wire/FFR Study;  Surgeon: Peter M Martinique, MD;  Location: [place] [facility];  Service: Cardiovascular;  Laterality: N/A;  ? COLONOSCOPY    ? CORONARY ANGIOPLASTY  2000  ? CORONARY ANGIOPLASTY WITH STENT PLACEMENT  2015  ? CORONARY ARTERY BYPASS GRAFT N/A 09/02/2015  ? Procedure: CORONARY ARTERY BYPASS GRAFTING (CABG) x 2 (LIMA-LAD, SVG-Intermediate) ENDOSCOPIC GREATER  SAPHENOUS VEIN HARVEST RIGHT THIGH;  Surgeon: Rexene Alberts, MD;  Location: [place];  Service: Open Heart Surgery;  Laterality: N/A;  ? CORONARY BALLOON ANGIOPLASTY N/A 01/04/2021  ? Procedure: CORONARY BALLOON ANGIOPLASTY;  Surgeon: Jettie Booze, MD;  Location: [place] [facility];  Service: Cardiovascular;  Laterality: N/A;  ? FOOT SURGERY    ? IMPLANTABLE CARDIOVERTER DEFIBRILLATOR IMPLANT N/A 04/17/2014  ? Procedure: IMPLANTABLE CARDIOVERTER DEFIBRILLATOR IMPLANT;  Surgeon: Evans Lance, MD;  Location: [facility] CATH LAB;  Service: Cardiovascular;  Laterality: N/A;  ? LEFT HEART CATH AND CORONARY ANGIOGRAPHY N/A 01/04/2021  ? Procedure: LEFT HEART CATH AND CORONARY ANGIOGRAPHY;  Surgeon: Jettie Booze, MD;  Location: [place] [facility];  Service: Cardiovascular;  Laterality: N/A;  ? LEFT HEART CATH AND CORS/GRAFTS ANGIOGRAPHY N/A 08/30/2017  ? Procedure: LEFT HEART CATH AND CORS/GRAFTS ANGIOGRAPHY;  Surgeon: Nelva Bush, MD;  Location: [place] [facility];  Service: Cardiovascular;  Laterality: N/A;  ? LEFT HEART CATHETERIZATION WITH CORONARY ANGIOGRAM N/A 12/11/2013  ? Procedure: LEFT HEART CATHETERIZATION WITH CORONARY ANGIOGRAM;  Surgeon: Peter M Martinique, MD;  Location: [facility] CATH LAB;  Service: Cardiovascular;  Laterality: N/A;  ? LEFT HEART CATHETERIZATION WITH CORONARY ANGIOGRAM N/A 01/05/2015  ? Procedure: LEFT HEART CATHETERIZATION WITH CORONARY ANGIOGRAM;  Surgeon: Peter M Martinique, MD;  Location: [facility] CATH LAB;  Service: Cardiovascular;  Laterality: N/A;  ? PERCUTANEOUS CORONARY STENT INTERVENTION (PCI-S)  12/11/2013  ? Procedure: PERCUTANEOUS CORONARY STENT INTERVENTION (PCI-S);  Surgeon: Peter M Martinique, MD;  Location: [facility] CATH LAB;  Service: Cardiovascular;;  prov LAD and mid LAD  ? PERCUTANEOUS CORONARY STENT INTERVENTION (PCI-S) N/A 02/03/2015  ? Procedure: PERCUTANEOUS CORONARY STENT INTERVENTION (PCI-S);  Surgeon: Jettie Booze, MD;  Location: [facility] CATH LAB;  Service: Cardiovascular;  Laterality: N/A;   ? TEE WITHOUT CARDIOVERSION N/A 09/02/2015  ? Procedure: TRANSESOPHAGEAL ECHOCARDIOGRAM (TEE);  Surgeon: Rexene Alberts, MD;  Location: [place];  Service: Open Heart Surgery;  Laterality: N/A;  ? ? ?MEDIC

## 2022-02-15 NOTE — Telephone Encounter (Signed)
Spoke to patient recent lab results given.Patient stated he will be having surgery with Dr.Wrenn 02/21/22.He wanted to know when to hold Eliquis.Surgical clearance was done 01/24/22.Advised to hold Eliquis 2 days before surgery.Advised to continue Aspirin. ?

## 2022-02-15 NOTE — Anesthesia Preprocedure Evaluation (Addendum)
Anesthesia Evaluation  ?Patient identified by MRN, date of birth, ID band ?Patient awake ? ? ? ?Reviewed: ?Allergy & Precautions, NPO status , Patient's Chart, lab work & pertinent test results ? ?Airway ?Mallampati: II ? ?TM Distance: >3 FB ?Neck ROM: Full ? ? ? Dental ? ?(+) Poor Dentition, Chipped, Loose, Dental Advisory Given,  ?  ?Pulmonary ?COPD, Current Smoker,  ?  ?Pulmonary exam normal ?breath sounds clear to auscultation ? ? ? ? ? ? Cardiovascular ?hypertension, + CAD, + Past MI, + Cardiac Stents and + CABG  ?Normal cardiovascular exam+ Cardiac Defibrillator ? ?Rhythm:Regular Rate:Normal ? ?There is no left ventricular thrombus (Definity contrast was used).  ?There is apical dyskinesis. There is severe inferior wall hypokinesis and  ?inferoseptal akinesis.. Left ventricular ejection fraction, by estimation,  ?is 25 to 30%. The left ventricle  ?has severely decreased function. The left ventricle demonstrates regional  ?wall motion abnormalities (see scoring diagram/findings for description).  ?The left ventricular internal cavity size was moderately to severely  ?dilated. Left ventricular diastolic  ?parameters are consistent with Grade I diastolic dysfunction (impaired  ?relaxation).  ??2. Right ventricular systolic function is normal. The right ventricular  ?size is normal. There is normal pulmonary artery systolic pressure. The  ?estimated right ventricular systolic pressure is 12.2 mmHg.  ??3. Left atrial size was severely dilated.  ??4. The mitral valve is normal in structure. Trivial mitral valve  ?regurgitation.  ??5. The aortic valve is tricuspid. There is mild calcification of the  ?aortic valve. There is mild thickening of the aortic valve. Aortic valve  ?regurgitation is not visualized. Mild aortic valve sclerosis is present,  ?with no evidence of aortic valve  ?stenosis.  ??6. Aortic dilatation noted. There is mild dilatation of the aortic root,  ?measuring 39  mm.  ?  ?Neuro/Psych ?negative neurological ROS ? negative psych ROS  ? GI/Hepatic ?negative GI ROS, Neg liver ROS,   ?Endo/Other  ?negative endocrine ROS ? Renal/GU ?negative Renal ROS  ?negative genitourinary ?  ?Musculoskeletal ?negative musculoskeletal ROS ?(+)  ? Abdominal ?  ?Peds ?negative pediatric ROS ?(+)  Hematology ?negative hematology ROS ?(+)   ?Anesthesia Other Findings ? ? Reproductive/Obstetrics ?negative OB ROS ? ?  ? ? ? ? ? ? ? ? ? ? ? ? ? ?  ?  ? ? ? ? ? ?Anesthesia Physical ?Anesthesia Plan ? ?ASA: 4 ? ?Anesthesia Plan: General  ? ?Post-op Pain Management: Minimal or no pain anticipated  ? ?Induction: Intravenous ? ?PONV Risk Score and Plan: 1 and Ondansetron and Treatment may vary due to age or medical condition ? ?Airway Management Planned: LMA ? ?Additional Equipment:  ? ?Intra-op Plan:  ? ?Post-operative Plan: Extubation in OR ? ?Informed Consent: I have reviewed the patients History and Physical, chart, labs and discussed the procedure including the risks, benefits and alternatives for the proposed anesthesia with the patient or authorized representative who has indicated his/her understanding and acceptance.  ? ? ? ?Dental advisory given ? ?Plan Discussed with: CRNA and Surgeon ? ?Anesthesia Plan Comments: (See PAT note 02/13/2022, Konrad Felix Ward, PA-C)  ? ? ? ? ? ?Anesthesia Quick Evaluation ? ?

## 2022-02-16 ENCOUNTER — Other Ambulatory Visit: Payer: Self-pay | Admitting: Nurse Practitioner

## 2022-02-16 ENCOUNTER — Other Ambulatory Visit: Payer: Self-pay

## 2022-02-16 ENCOUNTER — Ambulatory Visit (INDEPENDENT_AMBULATORY_CARE_PROVIDER_SITE_OTHER): Payer: Medicare Other | Admitting: Pharmacist Clinician (PhC)/ Clinical Pharmacy Specialist

## 2022-02-16 ENCOUNTER — Telehealth: Payer: Self-pay | Admitting: Nurse Practitioner

## 2022-02-16 DIAGNOSIS — I5022 Chronic systolic (congestive) heart failure: Secondary | ICD-10-CM

## 2022-02-16 DIAGNOSIS — I255 Ischemic cardiomyopathy: Secondary | ICD-10-CM | POA: Diagnosis not present

## 2022-02-16 MED ORDER — COLCHICINE 0.6 MG PO TABS: ORAL_TABLET | ORAL | 2 refills | Status: DC

## 2022-02-16 MED ORDER — PREDNISONE 10 MG (21) PO TBPK: ORAL_TABLET | ORAL | 0 refills | Status: DC

## 2022-02-16 NOTE — Telephone Encounter (Signed)
Patient states he is having a Gout flare up and is requesting an rx increase on the colchicine and some prednisone be sent to his pharmacy. AS, CMA ?

## 2022-02-16 NOTE — Telephone Encounter (Signed)
Patient has been made aware. AS, CMA ?

## 2022-02-16 NOTE — Progress Notes (Signed)
Renewed short term prescription for colchecine and will do 6 day prednisone taper. Both sent to [facility] [address].  ?

## 2022-02-16 NOTE — Patient Instructions (Signed)
Return for a a follow up appointment Thursday April 20 at 2:30 p. ? ?Check your blood pressure at home several times each week and keep record of the readings. ? ?Take your BP meds as follows: ? Stop spironolactone for 2 weeks.  If you are gout free for at least a week, re-start spironolactone with 1/2 tablet only on Mondays and Fridays ? Continue with all other mediations ? ?Bring all of your meds, your BP cuff and your record of home blood pressures to your next appointment.  Exercise as you?re able, try to walk approximately 30 minutes per day.  Keep salt intake to a minimum, especially watch canned and prepared boxed foods.  Eat more fresh fruits and vegetables and fewer canned items.  Avoid eating in fast food restaurants.  ? ? HOW TO TAKE YOUR BLOOD PRESSURE: ?Rest 5 minutes before taking your blood pressure. ? Don?t smoke or drink caffeinated beverages for at least 30 minutes before. ?Take your blood pressure before (not after) you eat. ?Sit comfortably with your back supported and both feet on the floor (don?t cross your legs). ?Elevate your arm to heart level on a table or a desk. ?Use the proper sized cuff. It should fit smoothly and snugly around your bare upper arm. There should be enough room to slip a fingertip under the cuff. The bottom edge of the cuff should be 1 inch above the crease of the elbow. ?Ideally, take 3 measurements at one sitting and record the average. ? ? ?

## 2022-02-16 NOTE — Telephone Encounter (Signed)
Renewed short term prescription for colchecine and will do 6 day prednisone taper. I cannot do more colchecine than that becase he is also on coreg. Both sent to [facility] [address].

## 2022-02-16 NOTE — Progress Notes (Signed)
? ? ? ?02/20/2022 ?Raja Dave ?Oct 19, 1956 ?[id number] ? ? ?HPI:  Kerry Mason is a 66 y.o. male patient of Dr Martinique, with a [place] below who presents today for heart failure medication titration.  He was seen by Dr. Martinique last month, and it was noted that he had been denied Entresto by insurer.  He was continued on lisinopril at that time.  Today he is in the office for GDMT follow up.  At his last visit we started him on Entresto 24/26 mg and cleared up the insurance issues.  An Echo in 2/22 showed his EF to be at 25-30% ? ?Today he returns for further titration.  Since then he has been plagued by 3-4 gout flares - rotating between knees, ankles and toes.  He also notes that his BP has been "all over the board", mostly 105-120, but did have an episode where it spiked to 190 (probably gout pain related).  We currently have him on 3 of the 4 GDMT medications.  Waiting on SGLT2 inhibitor due to cost concerns ? ?Past Medical History: ?CAD S/p CABG and multiple PCI (remote stenting of RCA in 2000, STEMI (1/15), NSTEMI (7/16)  ?hyperlipidemia 1/23 - LDL 46 on atorvastatin 80  ?AAA Followed by [facility], measured 5.1 cm  ?hypertension Controlled with HF medications  ?gout Now on allopurinol  ?  ? ?Blood Pressure Goal:  130/80 ? ?Current Medications: Entresto 24/26 mg bid, carvedilol 6.25 mg bid, spironolactone 25 mg qd ? ?Family Hx:  neither parent with heart disease mom died at 22 - leukemia, dad at 49 Parkinson's, brother doesn't go to [place]; son 72 now with high cholesterol ? ?Social Hx: smokes about 1/2 ppd; no alcohol, 1 cup coffee each morning ? ?Diet: mix of home and out; occasional fast food; some fresh veggies  ? ?Exercise: walks all day, usually 10,000- 14,000 steps by evening ? ?Home BP readings: checks occasionally, no written information with him today ? ?Intolerances: nkda ? ?Labs: 1/23:  Na 139, K 5, Glu 95, BUN 14, SCr 0.98, GFR 86 ? ? ?Wt Readings from Last 3 Encounters:  ?02/16/22 228 lb (103.4 kg)  ?01/13/22  228 lb (103.4 kg)  ?01/04/22 233 lb 6.4 oz (105.9 kg)  ? ?BP Readings from Last 3 Encounters:  ?02/16/22 98/68  ?01/13/22 90/60  ?01/04/22 (!) 93/58  ? ?Pulse Readings from Last 3 Encounters:  ?01/13/22 77  ?01/04/22 65  ?12/22/21 80  ? ? ?Current Outpatient Medications  ?Medication Sig Dispense Refill  ? acetaminophen (TYLENOL) 325 MG tablet Take 2 tablets (650 mg total) by mouth every 4 (four) hours as needed for headache or mild pain.    ? allopurinol (ZYLOPRIM) 300 MG tablet Take 1 tablet (300 mg total) by mouth 2 (two) times daily. 180 tablet 3  ? apixaban (ELIQUIS) 5 MG TABS tablet Take 1 tablet (5 mg total) by mouth 2 (two) times daily. 60 tablet 11  ? APPLE CIDER VINEGAR PO Take 1 capsule by mouth daily.    ? Ascorbic Acid (VITAMIN C) 1000 MG tablet Take 1,000 mg by mouth daily.    ? aspirin EC 81 MG tablet Take 81 mg by mouth daily.    ? atorvastatin (LIPITOR) 80 MG tablet Take 1 tablet (80 mg total) by mouth daily. 90 tablet 3  ? budesonide-formoterol (SYMBICORT) 160-4.5 MCG/ACT inhaler Inhale 2 puffs into the lungs 2 (two) times daily. 1 each 6  ? buPROPion (WELLBUTRIN XL) 150 MG 24 hr tablet TAKE 1 TABLET BY  MOUTH EVERY DAY 90 tablet 0  ? Calcium Carbonate-Vit D-Min (CALCIUM 1200 PO) Take 1 capsule by mouth daily.    ? carvedilol (COREG) 6.25 MG tablet Take 1 tablet (6.25 mg total) by mouth 2 (two) times daily. 180 tablet 3  ? cholecalciferol (VITAMIN D3) 25 MCG (1000 UNIT) tablet Take 1,000 Units by mouth daily.    ? colchicine 0.6 MG tablet Take 2 tablet po one time.  Then take 1 tablet after one hour. Then take 1 tablet daily for 5 days. 8 tablet 2  ? ezetimibe (ZETIA) 10 MG tablet Take 1 tablet (10 mg total) by mouth daily. 90 tablet 3  ? furosemide (LASIX) 40 MG tablet Take 1 tablet (40 mg total) by mouth daily. 90 tablet 1  ? isosorbide mononitrate (IMDUR) 30 MG 24 hr tablet Take 1 tablet (30 mg total) by mouth daily. 90 tablet 3  ? Magnesium Oxide 200 MG TABS Take 1 tablet (200 mg total) by mouth  2 (two) times daily. (Patient taking differently: Take 200 mg by mouth daily.) 60 tablet 0  ? Multiple Vitamin (MULTIVITAMIN) tablet Take 1 tablet by mouth daily.    ? Na Sulfate-K Sulfate-Mg Sulf 17.5-3.13-1.6 GM/177ML SOLN SMARTSIG:354 Milliliter(s) By Mouth Once    ? nitroGLYCERIN (NITROSTAT) 0.4 MG SL tablet DISSOLVE 1 TABLET UNDER KTHE TONGUE EVERY 5 MINUTES AS NEEDED FOR CHEST PAIN. MAX 3 DOSES/15 MIN 25 tablet 1  ? OMEGA-3 FATTY ACIDS PO Take 1 tablet by mouth daily.    ? predniSONE (STERAPRED UNI-PAK 21 TAB) 10 MG (21) TBPK tablet 6 day taper - take by mouth as directed for 6 days 21 tablet 0  ? sacubitril-valsartan (ENTRESTO) 24-26 MG Take 1 tablet by mouth 2 (two) times daily. 60 tablet 6  ? sildenafil (VIAGRA) 50 MG tablet TAKE 1 TABLET BY MOUTH DAILY AS NEEDED FOR ERECTILE DYSFUNCTION. 10 tablet 3  ? spironolactone (ALDACTONE) 25 MG tablet Take 25 mg by mouth daily.    ? tamsulosin (FLOMAX) 0.4 MG CAPS capsule Take 0.4 mg by mouth daily.    ? tiotropium (SPIRIVA) 18 MCG inhalation capsule Place 1 capsule (18 mcg total) into inhaler and inhale daily. 30 capsule 12  ? TURMERIC PO Take 1 capsule by mouth daily.    ? ?Current Facility-Administered Medications  ?Medication Dose Route Frequency Provider Last Rate Last Admin  ? nicotine (NICODERM CQ - dosed in mg/24 hr) patch 14 mg  14 mg Transdermal Daily Duke, Tami Lin, PA      ? ? ?No Known Allergies ? ?Past Medical History:  ?Diagnosis Date  ? Acute systolic CHF (congestive heart failure) ([place])   ? AICD (automatic cardioverter/defibrillator) present   ? Anxiety   ? Ascending aortic aneurysm 08/31/2017  ? Back pain 08/31/2017  ? Basal cell carcinoma   ? left arm  ? CAD (coronary artery disease)   ? a. 2000: s/p stent of RCA 2000 with BMS  b. 2015: STEMI s/p LHC with old occlusion of RCA and DESx2 to LAD  c. 03/04/73: complicated PCI on 3/2 for CTO of mid RCA with coronary perforation and cardiac tamponade requiring emergent pericardiocentesis     ?  Cardiac tamponade   ? a. 02/03/15 2/2 coronary perforation during CTO procedure. Sealed with graftmaster coated stent.   ? COPD (chronic obstructive pulmonary disease) ([place]) 08/31/2017  ? Gout   ? History of pneumonia   ? HTN (hypertension)   ? Hyperlipidemia   ? Ischemic cardiomyopathy   ? a.  2D ECHO: EF 25-30%. Akinesis of the anteroseptal and  ? Left main coronary artery disease   ? Myocardial infarction [facility])   ? Obesity   ? S/P CABG x 2 09/02/2015  ? LIMA to LAD, SVG to ramus intermediate branch, EVH via right thigh   ? Shortness of breath dyspnea   ? Tobacco abuse   ? Urinary frequency   ? ? ?Blood pressure 98/68, height '6\' 1"'$  (1.854 m), weight 228 lb (103.4 kg). ? ?Chronic systolic CHF (congestive heart failure), NYHA class 3 ([place]) ?Ptient with HFrEF currently on Entresto, carvedilol and spironolactone.  Because of the ongoing issues with gout will stop the spironolactone for now to determine if this is the cause.  After 2 weeks gout free I suggested a trial of spironolactone 12.5 mg twice weekly.   We need to continue holding off on SGLT2 for now, but consider at next appointment if gout resolved.  We will see him back in a month for follow up.  ? ?Tommy Medal PharmD CPP [facility] ?[place] ?[address] ?[place], [place] [address] ?[phone] ?

## 2022-02-20 ENCOUNTER — Encounter: Payer: Self-pay | Admitting: Pharmacist Clinician (PhC)/ Clinical Pharmacy Specialist

## 2022-02-20 NOTE — H&P (Signed)
I have swelling in my scrotum.  ?HPI: Kerry Mason is a 66 year-old male established patient who is here for scrotal swelling. ? ?02/14/2022: 66 year old male presents today for preoperative appointment prior to undergoing spermatocelectomy on 02/21/2022 with his urologist. He has had no changes to his locations or to his health history. He denies voiding changes. He denies dysuria and gross hematuria. He has had no chest pain or shortness of breath.  ? ?Kerry Mason is a 66 yo male who presents for a right scrotal mass, LUTS and ED. He had a vasectomy remotely. He felt a lump in 2000 and he had an [place] that showed a possible venous lesion. He has had further enlargement over the years but has no pain. It is the size of another testicle. He has a several year history of ED. He was abstinent for about 15 years and then about 5 years ago noted increased difficult. Now he can have some tumescence but it is mild. He has tried sildenafil '20mg'$  but never took more than one tab and had no benefit. He has no penile pain or curve. He has a good libido. He has moderate LUTS with a reduced stream and frequency. He has nocturia x 2. He has occasional urgency. He has had no treatment for the LUTS. His recent PSA is 1.4. He is on isosorbide chronically and took sildenafil while on that drug.  ? ?  ?ALLERGIES: No Known Allergies ?  ? ?MEDICATIONS: Allopurinol 300 mg tablet  ?Ketorolac Tromethamine 0.5 % drops  ?Tamsulosin Hcl 0.4 mg capsule 1 capsule PO Daily  ?Acetaminophen  ?Aspirin Ec 81 mg tablet, delayed release  ?Atorvastatin Calcium 80 mg tablet  ?Bupropion Hcl Sr 150 mg tablet, extended release 12 hr  ?Carvedilol 6.25 mg tablet  ?Cholecalciferol  ?Colchicine 0.6 mg tablet  ?Eliquis 5 mg tablet  ?Entresto 24 mg-26 mg tablet  ?Ezetimibe 10 mg tablet  ?Furosemide 40 mg tablet  ?Isosorbide Dinitrate 30 mg tablet  ?Magnesium Oxide  ?Nitroglycerin  ?Ofloxacin 0.3 % drops  ?Prednisolone Acetate 1 % suspension, drops  ?Sildenafil Citrate 50  mg tablet  ?Spiriva Handihaler  ?Spironolactone 25 mg tablet  ?Symbicort 160 mcg-4.5 mcg/actuation hfa aerosol with adapter  ?  ? ?GU PSH: No GU PSH   ?   ?PSH Notes: Bypass surgery- 2018  ? ?NON-GU PSH: No Non-GU PSH   ? 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 I discussed consideration of daily tadalafil for the ED and LUTS but he is on isosorbide, so I will need to get clearance for that from Dr. Martinique. If we don't get clearance for that, it would be worthwhile to consider an alpha blocker. I have reviewed the side effects for both. , - 01/04/2022 ?BPH w/LUTS, He has moderate LUTS. I discussed consideration of daily tadalafil for the ED and LUTS but he is on isosorbide, so I will need to get clearance for that from Dr. Martinique. If we don't get clearance for that, it would be worthwhile to consider an alpha blocker. I have reviewed the side effects for both. - 01/04/2022 ?ED due to arterial insufficiency, He has severe ED and didn't respond to a single dose of sildenafil '20mg'$ . I discussed tadalafil as noted above, but the isosorbide is a contraindication so I will query Dr. Martinique on that. I discussed Shockwave therapy, Vacuum therapy, injections and IPP. I will let him know what Dr. Martinique says and have given him handouts to review. - 01/04/2022 ?Nocturia -  01/04/2022 ?Urinary Frequency - 01/04/2022 ?Urinary Urgency - 01/04/2022 ?Weak Urinary Stream - 01/04/2022 ?  ?   ?PMH Notes: Heart attack  ? ?NON-GU PMH: Gout ?Heart disease, unspecified ?Hypercholesterolemia ?Hypertension ?Right heart failure, unspecified ?  ? ?FAMILY HISTORY: Death In The Family Father - Other ?Death In The Family Mother - Other ?Parkinson's Disease - Father  ? ?SOCIAL HISTORY: Marital Status: Divorced ?Preferred Language: [place]; Ethnicity: Not Hispanic Or Latino; Race: White ?Current Smoking Status: Patient smokes.  ? ?Tobacco Use Assessment Completed: Used Tobacco in last 30 days? ?Does not use smokeless tobacco. ?Has never  drank.  ?Does not use drugs. ?Drinks 1 caffeinated drink per day. 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 ?  ?  Notes: He is trying to quit.   ? ?REVIEW OF SYSTEMS:    ?GU Review Male:   Patient reports erection problems. Patient denies frequent urination, hard to postpone urination, burning/ pain with urination, get up at night to urinate, leakage of urine, stream starts and stops, trouble starting your stream, have to strain to urinate , and penile pain.  ?Gastrointestinal (Upper):   Patient denies nausea, vomiting, and indigestion/ heartburn.  ?Gastrointestinal (Lower):   Patient denies diarrhea and constipation.  ?Constitutional:   Patient denies fever, night sweats, weight loss, and fatigue.  ?Skin:   Patient denies skin rash/ lesion and itching.  ?Cardiovascular:   Patient denies chest pains and leg swelling.  ?Respiratory:   Patient denies cough and shortness of breath.  ?Musculoskeletal:   Patient denies back pain and joint pain.  ?Neurological:   Patient denies headaches and dizziness.  ?Psychologic:   Patient denies depression and anxiety.  ? ?VITAL SIGNS:    ?  02/14/2022 02:07 PM  ?BP 100/66 mmHg  ?Pulse 71 /min  ?Temperature 97.1 F / 36.1 C  ? ?MULTI-SYSTEM PHYSICAL EXAMINATION:    ?Constitutional: Well-nourished. No physical deformities. Normally developed. Good grooming.  ?Respiratory: Normal breath sounds. No labored breathing, no use of accessory muscles.   ?Cardiovascular: Regular rate and rhythm. No murmur, no gallop. Normal temperature, normal extremity pulses, no swelling, no varicosities.   ?Skin: No paleness, no jaundice, no cyanosis. No lesion, no ulcer, no rash.  ?Neurologic / Psychiatric: Oriented to time, oriented to place, oriented to person. No depression, no anxiety, no agitation.  ?Gastrointestinal: No mass, no tenderness, no rigidity, non obese abdomen.  ? ?  ?Complexity of Data:  ?Source Of History:  Patient  ?Records Review:   Previous Doctor  Records, Previous Patient Records  ?Urine Test Review:   Urinalysis  ? 02/14/22  ?Urinalysis  ?Urine Appearance Clear   ?Urine Color Yellow   ?Urine Glucose Neg mg/dL  ?Urine Bilirubin Neg mg/dL  ?Urine Ketones Neg mg/dL  ?Urine Specific Gravity 1.015   ?Urine Blood Neg ery/uL  ?Urine pH 6.0   ?Urine Protein Neg mg/dL  ?Urine Urobilinogen 0.2 mg/dL  ?Urine Nitrites Neg   ?Urine Leukocyte Esterase Neg leu/uL  ? ?PROCEDURES:    ? ?     Urinalysis ?Dipstick Dipstick Cont'd  ?Color: Yellow Bilirubin: Neg mg/dL  ?Appearance: Clear Ketones: Neg mg/dL  ?Specific Gravity: 1.015 Blood: Neg ery/uL  ?pH: 6.0 Protein: Neg mg/dL  ?Glucose: Neg mg/dL Urobilinogen: 0.2 mg/dL  ?  Nitrites: Neg  ?  Leukocyte Esterase: Neg leu/uL  ? ? ?ASSESSMENT:  ?    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 ?PLAN:    ? ?  Schedule ?Return Visit/Planned Activity: Keep Scheduled Appointment  ? ? ?      Document ?Letter(s):  Created for Patient: Clinical Summary  ? ? ?     Notes:   Urinalysis is negative. He understands to notify our office with any changes to his health history or to his medications prior to his scheduled surgery. All of the questions he had today were answered to the best of my ability. I did advise he pick up underwear that we will help with elevation and scrotal support. He will keep his upcoming surgery as scheduled on 02/21/2022. He may call the office with any concerns in the interval.  ? ?     Next Appointment:    ?  Next Appointment: 02/21/2022 10:00 AM  ?  Appointment Type: Surgery   ?  Location: [facility] [phone]  ?  Provider: Irine Seal, M.D.  ?  Reason for Visit: [place]  ?  ? ? ?

## 2022-02-20 NOTE — Assessment & Plan Note (Signed)
Ptient with HFrEF currently on Entresto, carvedilol and spironolactone.  Because of the ongoing issues with gout will stop the spironolactone for now to determine if this is the cause.  After 2 weeks gout free I suggested a trial of spironolactone 12.5 mg twice weekly.   We need to continue holding off on SGLT2 for now, but consider at next appointment if gout resolved.  We will see him back in a month for follow up.  ?

## 2022-02-21 ENCOUNTER — Other Ambulatory Visit: Payer: Self-pay

## 2022-02-21 ENCOUNTER — Ambulatory Visit (HOSPITAL_COMMUNITY)
Admission: RE | Admit: 2022-02-21 | Discharge: 2022-02-21 | Disposition: A | Discharge status: Home / Self Care | Payer: Medicare Other | Attending: Urology | Admitting: Urology

## 2022-02-21 ENCOUNTER — Encounter (HOSPITAL_COMMUNITY)
Admission: RE | Disposition: A | Discharge status: Home / Self Care | Payer: Self-pay | Source: Home / Self Care | Attending: Urology

## 2022-02-21 ENCOUNTER — Ambulatory Visit (HOSPITAL_BASED_OUTPATIENT_CLINIC_OR_DEPARTMENT_OTHER): Payer: Medicare Other | Admitting: Anesthesiology

## 2022-02-21 ENCOUNTER — Ambulatory Visit (HOSPITAL_COMMUNITY): Payer: Medicare Other | Admitting: Physician Assistant

## 2022-02-21 ENCOUNTER — Encounter (HOSPITAL_COMMUNITY): Payer: Self-pay | Admitting: Urology

## 2022-02-21 DIAGNOSIS — I10 Essential (primary) hypertension: Secondary | ICD-10-CM

## 2022-02-21 DIAGNOSIS — I700 Atherosclerosis of aorta: Secondary | ICD-10-CM | POA: Insufficient documentation

## 2022-02-21 DIAGNOSIS — N4340 Spermatocele of epididymis, unspecified: Secondary | ICD-10-CM

## 2022-02-21 DIAGNOSIS — F17200 Nicotine dependence, unspecified, uncomplicated: Secondary | ICD-10-CM | POA: Insufficient documentation

## 2022-02-21 DIAGNOSIS — Z6830 Body mass index (BMI) 30.0-30.9, adult: Secondary | ICD-10-CM | POA: Insufficient documentation

## 2022-02-21 DIAGNOSIS — N503 Cyst of epididymis: Secondary | ICD-10-CM | POA: Insufficient documentation

## 2022-02-21 DIAGNOSIS — Z95810 Presence of automatic (implantable) cardiac defibrillator: Secondary | ICD-10-CM | POA: Diagnosis not present

## 2022-02-21 DIAGNOSIS — Z955 Presence of coronary angioplasty implant and graft: Secondary | ICD-10-CM | POA: Diagnosis not present

## 2022-02-21 DIAGNOSIS — N4341 Spermatocele of epididymis, single: Secondary | ICD-10-CM | POA: Insufficient documentation

## 2022-02-21 DIAGNOSIS — Z7951 Long term (current) use of inhaled steroids: Secondary | ICD-10-CM | POA: Diagnosis not present

## 2022-02-21 DIAGNOSIS — I2510 Atherosclerotic heart disease of native coronary artery without angina pectoris: Secondary | ICD-10-CM | POA: Diagnosis not present

## 2022-02-21 DIAGNOSIS — Z951 Presence of aortocoronary bypass graft: Secondary | ICD-10-CM | POA: Diagnosis not present

## 2022-02-21 DIAGNOSIS — J449 Chronic obstructive pulmonary disease, unspecified: Secondary | ICD-10-CM | POA: Insufficient documentation

## 2022-02-21 DIAGNOSIS — I252 Old myocardial infarction: Secondary | ICD-10-CM | POA: Insufficient documentation

## 2022-02-21 DIAGNOSIS — E669 Obesity, unspecified: Secondary | ICD-10-CM | POA: Diagnosis not present

## 2022-02-21 DIAGNOSIS — Z01818 Encounter for other preprocedural examination: Secondary | ICD-10-CM

## 2022-02-21 HISTORY — PX: SPERMATOCELECTOMY: SHX2420

## 2022-02-21 LAB — BASIC METABOLIC PANEL
Anion gap: 7 (ref 5–15)
BUN: 22 mg/dL (ref 8–23)
CO2: 23 mmol/L (ref 22–32)
Calcium: 8.8 mg/dL — ABNORMAL LOW (ref 8.9–10.3)
Chloride: 105 mmol/L (ref 98–111)
Creatinine, Ser: 0.67 mg/dL (ref 0.61–1.24)
GFR, Estimated: >60 mL/min (ref >60)
Glucose, Bld: 88 mg/dL (ref 70–99)
Potassium: 4 mmol/L (ref 3.5–5.1)
Sodium: 135 mmol/L (ref 135–145)

## 2022-02-21 LAB — CBC
HCT: 41 % (ref 39.0–52.0)
Hemoglobin: 13.5 g/dL (ref 13.0–17.0)
MCH: 31 pg (ref 26.0–34.0)
MCHC: 32.9 g/dL (ref 30.0–36.0)
MCV: 94.3 fL (ref 80.0–100.0)
Platelets: 293 10*3/uL (ref 150–400)
RBC: 4.35 MIL/uL (ref 4.22–5.81)
RDW: 16.2 % — ABNORMAL HIGH (ref 11.5–15.5)
WBC: 15.5 10*3/uL — ABNORMAL HIGH (ref 4.0–10.5)
nRBC: 0 % (ref 0.0–0.2)

## 2022-02-21 SURGERY — EXCISION, SPERMATOCELE
Anesthesia: General | Laterality: Right

## 2022-02-21 MED ORDER — SODIUM CHLORIDE 0.9% FLUSH: 3.0000 mL | Freq: Two times a day (BID) | INTRAVENOUS | Status: DC

## 2022-02-21 MED ORDER — BUPIVACAINE HCL 0.25 % IJ SOLN
INTRAMUSCULAR | Status: DC | PRN
  Administered 2022-02-21: 9 mL

## 2022-02-21 MED ORDER — PHENYLEPHRINE 40 MCG/ML (10ML) SYRINGE FOR IV PUSH (FOR BLOOD PRESSURE SUPPORT)
PREFILLED_SYRINGE | INTRAVENOUS | Status: AC
  Filled 2022-02-21: qty 10

## 2022-02-21 MED ORDER — ACETAMINOPHEN 10 MG/ML IV SOLN: 1000.0000 mg | Freq: Once | INTRAVENOUS | Status: DC | PRN

## 2022-02-21 MED ORDER — BUPIVACAINE HCL 0.25 % IJ SOLN
INTRAMUSCULAR | Status: AC
  Filled 2022-02-21: qty 1

## 2022-02-21 MED ORDER — LIDOCAINE HCL (CARDIAC) PF 100 MG/5ML IV SOSY
PREFILLED_SYRINGE | INTRAVENOUS | Status: DC | PRN
Start: 2022-02-21 — End: 2022-02-21
  Administered 2022-02-21: 50 mg via INTRAVENOUS

## 2022-02-21 MED ORDER — EPHEDRINE 5 MG/ML INJ
INTRAVENOUS | Status: AC
  Filled 2022-02-21: qty 5

## 2022-02-21 MED ORDER — DEXMEDETOMIDINE (PRECEDEX) IN NS 20 MCG/5ML (4 MCG/ML) IV SYRINGE
PREFILLED_SYRINGE | INTRAVENOUS | Status: DC | PRN
  Administered 2022-02-21: 8 ug via INTRAVENOUS

## 2022-02-21 MED ORDER — HYDROCODONE-ACETAMINOPHEN 5-325 MG PO TABS: 1.0000 | ORAL_TABLET | Freq: Four times a day (QID) | ORAL | 0 refills | Status: DC | PRN

## 2022-02-21 MED ORDER — MIDAZOLAM HCL 2 MG/2ML IJ SOLN
INTRAMUSCULAR | Status: AC
  Filled 2022-02-21: qty 2

## 2022-02-21 MED ORDER — ONDANSETRON HCL 4 MG/2ML IJ SOLN
INTRAMUSCULAR | Status: DC | PRN
  Administered 2022-02-21: 4 mg via INTRAVENOUS

## 2022-02-21 MED ORDER — FENTANYL CITRATE (PF) 100 MCG/2ML IJ SOLN
INTRAMUSCULAR | Status: AC
  Filled 2022-02-21: qty 2

## 2022-02-21 MED ORDER — DEXAMETHASONE SODIUM PHOSPHATE 10 MG/ML IJ SOLN
INTRAMUSCULAR | Status: AC
  Filled 2022-02-21: qty 1

## 2022-02-21 MED ORDER — PHENYLEPHRINE HCL-NACL 20-0.9 MG/250ML-% IV SOLN: INTRAVENOUS | Status: DC | PRN

## 2022-02-21 MED ORDER — PROPOFOL 10 MG/ML IV BOLUS
INTRAVENOUS | Status: DC | PRN
  Administered 2022-02-21: 110 mg via INTRAVENOUS

## 2022-02-21 MED ORDER — CEFAZOLIN SODIUM-DEXTROSE 2-4 GM/100ML-% IV SOLN
2.0000 g | INTRAVENOUS | Status: AC
Start: 2022-02-21 — End: 2022-02-21
  Administered 2022-02-21: 2 g via INTRAVENOUS
  Filled 2022-02-21: qty 100

## 2022-02-21 MED ORDER — ORAL CARE MOUTH RINSE: 15.0000 mL | Freq: Once | OROMUCOSAL | Status: AC

## 2022-02-21 MED ORDER — PROPOFOL 10 MG/ML IV BOLUS
INTRAVENOUS | Status: AC
  Filled 2022-02-21: qty 20

## 2022-02-21 MED ORDER — PHENYLEPHRINE HCL-NACL 20-0.9 MG/250ML-% IV SOLN
INTRAVENOUS | Status: DC | PRN
  Administered 2022-02-21: 120 ug via INTRAVENOUS
  Administered 2022-02-21: 80 ug via INTRAVENOUS
  Administered 2022-02-21: 40 ug via INTRAVENOUS
  Administered 2022-02-21: 80 ug via INTRAVENOUS
  Administered 2022-02-21 (×2): 100 ug via INTRAVENOUS

## 2022-02-21 MED ORDER — EPHEDRINE SULFATE (PRESSORS) 50 MG/ML IJ SOLN
INTRAMUSCULAR | Status: DC | PRN
  Administered 2022-02-21: 10 mg via INTRAVENOUS
  Administered 2022-02-21 (×2): 5 mg via INTRAVENOUS
  Administered 2022-02-21: 10 mg via INTRAVENOUS

## 2022-02-21 MED ORDER — ONDANSETRON HCL 4 MG/2ML IJ SOLN: 4.0000 mg | Freq: Once | INTRAMUSCULAR | Status: DC | PRN

## 2022-02-21 MED ORDER — FENTANYL CITRATE PF 50 MCG/ML IJ SOSY: 25.0000 ug | PREFILLED_SYRINGE | INTRAMUSCULAR | Status: DC | PRN

## 2022-02-21 MED ORDER — ONDANSETRON HCL 4 MG/2ML IJ SOLN
INTRAMUSCULAR | Status: AC
  Filled 2022-02-21: qty 2

## 2022-02-21 MED ORDER — FENTANYL CITRATE (PF) 100 MCG/2ML IJ SOLN
INTRAMUSCULAR | Status: DC | PRN
  Administered 2022-02-21: 100 ug via INTRAVENOUS
  Administered 2022-02-21: 25 ug via INTRAVENOUS

## 2022-02-21 MED ORDER — CHLORHEXIDINE GLUCONATE 0.12 % MT SOLN
15.0000 mL | Freq: Once | OROMUCOSAL | Status: AC
Start: 2022-02-21 — End: 2022-02-21
  Administered 2022-02-21: 15 mL via OROMUCOSAL

## 2022-02-21 MED ORDER — MIDAZOLAM HCL 5 MG/5ML IJ SOLN
INTRAMUSCULAR | Status: DC | PRN
  Administered 2022-02-21: 2 mg via INTRAVENOUS

## 2022-02-21 MED ORDER — OXYCODONE HCL 5 MG PO TABS: 5.0000 mg | ORAL_TABLET | Freq: Once | ORAL | Status: DC | PRN

## 2022-02-21 MED ORDER — OXYCODONE HCL 5 MG/5ML PO SOLN: 5.0000 mg | Freq: Once | ORAL | Status: DC | PRN

## 2022-02-21 MED ORDER — ETOMIDATE 2 MG/ML IV SOLN
INTRAVENOUS | Status: DC | PRN
  Administered 2022-02-21: 8 mg via INTRAVENOUS

## 2022-02-21 MED ORDER — DEXAMETHASONE SODIUM PHOSPHATE 10 MG/ML IJ SOLN
INTRAMUSCULAR | Status: DC | PRN
  Administered 2022-02-21: 10 mg via INTRAVENOUS

## 2022-02-21 MED ORDER — ETOMIDATE 2 MG/ML IV SOLN
INTRAVENOUS | Status: AC
  Filled 2022-02-21: qty 10

## 2022-02-21 MED ORDER — DEXMEDETOMIDINE (PRECEDEX) IN NS 20 MCG/5ML (4 MCG/ML) IV SYRINGE
PREFILLED_SYRINGE | INTRAVENOUS | Status: AC
  Filled 2022-02-21: qty 5

## 2022-02-21 MED ORDER — LACTATED RINGERS IV SOLN: INTRAVENOUS | Status: DC

## 2022-02-21 MED ORDER — 0.9 % SODIUM CHLORIDE (POUR BTL) OPTIME
TOPICAL | Status: DC | PRN
Start: 2022-02-21 — End: 2022-02-21
  Administered 2022-02-21: 1000 mL

## 2022-02-21 SURGICAL SUPPLY — 31 items
APL SKNCLS STERI-STRIP NONHPOA (GAUZE/BANDAGES/DRESSINGS) ×1
BAG COUNTER SPONGE SURGICOUNT (BAG) IMPLANT
BAG SPNG CNTER NS LX DISP (BAG)
BENZOIN TINCTURE PRP APPL 2/3 (GAUZE/BANDAGES/DRESSINGS) ×3 IMPLANT
BNDG GAUZE ELAST 4 BULKY (GAUZE/BANDAGES/DRESSINGS) ×3 IMPLANT
DRAIN PENROSE 0.25X18 (DRAIN) IMPLANT
DRAIN PENROSE 0.5X18 (DRAIN) IMPLANT
DRAPE LAPAROTOMY T 98X78 PEDS (DRAPES) ×3 IMPLANT
ELECT PENCIL ROCKER SW 15FT (MISCELLANEOUS) ×3 IMPLANT
ELECT REM PT RETURN 15FT ADLT (MISCELLANEOUS) ×3 IMPLANT
GAUZE 4X4 16PLY ~~LOC~~+RFID DBL (SPONGE) ×3 IMPLANT
GAUZE SPONGE 4X4 12PLY STRL (GAUZE/BANDAGES/DRESSINGS) ×3 IMPLANT
GLOVE SURG POLYISO LF SZ8 (GLOVE) ×3 IMPLANT
GOWN STRL REUS W/ TWL XL LVL3 (GOWN DISPOSABLE) ×2 IMPLANT
GOWN STRL REUS W/TWL XL LVL3 (GOWN DISPOSABLE) ×2
KIT BASIN OR (CUSTOM PROCEDURE TRAY) ×3 IMPLANT
KIT TURNOVER KIT A (KITS) IMPLANT
NEEDLE HYPO 22GX1.5 SAFETY (NEEDLE) ×3 IMPLANT
NS IRRIG 1000ML POUR BTL (IV SOLUTION) IMPLANT
PACK GENERAL/GYN (CUSTOM PROCEDURE TRAY) ×3 IMPLANT
SOL PREP POV-IOD 4OZ 10% (MISCELLANEOUS) ×3 IMPLANT
SPIKE FLUID TRANSFER (MISCELLANEOUS) ×3 IMPLANT
SUPPORT SCROTAL LG STRP (MISCELLANEOUS) ×3 IMPLANT
SUT CHROMIC 3 0 SH 27 (SUTURE) ×6 IMPLANT
SUT CHROMIC 4 0 SH 27 (SUTURE) IMPLANT
SUT VIC AB 3-0 SH 27 (SUTURE) ×2
SUT VIC AB 3-0 SH 27XBRD (SUTURE) ×2 IMPLANT
SUT VICRYL 0 TIES 12 18 (SUTURE) ×3 IMPLANT
SYR CONTROL 10ML LL (SYRINGE) ×3 IMPLANT
TOWEL OR 17X26 10 PK STRL BLUE (TOWEL DISPOSABLE) ×3 IMPLANT
WATER STERILE IRR 1000ML POUR (IV SOLUTION) IMPLANT

## 2022-02-21 NOTE — Addendum Note (Signed)
Addendum  created 02/21/22 1208 by Jonna Munro, CRNA  ? Flowsheet accepted  ?  ?

## 2022-02-21 NOTE — Anesthesia Postprocedure Evaluation (Signed)
Anesthesia Post Note ? ?Patient: Kerry Mason ? ?Procedure(s) Performed: RIGHT SPERMATOCELECTOMY (Right) ? ?  ? ?Patient location during evaluation: PACU ?Anesthesia Type: General ?Level of consciousness: awake and alert ?Pain management: pain level controlled ?Vital Signs Assessment: post-procedure vital signs reviewed and stable ?Respiratory status: spontaneous breathing, nonlabored ventilation, respiratory function stable and patient connected to nasal cannula oxygen ?Cardiovascular status: blood pressure returned to baseline and stable ?Postop Assessment: no apparent nausea or vomiting ?Anesthetic complications: no ? ? ?No notable events documented. ? ?Last Vitals:  ?Vitals:  ? 02/21/22 1130 02/21/22 1145  ?BP: 119/72 121/76  ?Pulse: 61 (!) 58  ?Resp: 19 20  ?Temp:    ?SpO2: 98% 94%  ?  ?Last Pain:  ?Vitals:  ? 02/21/22 1126  ?TempSrc:   ?PainSc: 4   ? ? ?  ?  ?  ?  ?  ?  ? ?Jonita Hirota S ? ? ? ? ?

## 2022-02-21 NOTE — Interval H&P Note (Signed)
History and Physical Interval Note: ? ?02/21/2022 ?9:46 AM ? ?Kerry Mason  has presented today for surgery, with the diagnosis of RIGHT SPERMATOCELE.  The various methods of treatment have been discussed with the patient and family. After consideration of risks, benefits and other options for treatment, the patient has consented to  Procedure(s): ?RIGHT SPERMATOCELECTOMY (Right) as a surgical intervention.  The patient's history has been reviewed, patient examined, no change in status, stable for surgery.  I have reviewed the patient's chart and labs.  Questions were answered to the patient's satisfaction.   ? ? ?Irine Seal ? ? ?

## 2022-02-21 NOTE — Transfer of Care (Signed)
Immediate Anesthesia Transfer of Care Note ? ?Patient: Kerry Mason ? ?Procedure(s) Performed: RIGHT SPERMATOCELECTOMY (Right) ? ?Patient Location: PACU ? ?Anesthesia Type:General ? ?Level of Consciousness: awake, alert , oriented and patient cooperative ? ?Airway & Oxygen Therapy: Patient Spontanous Breathing and Patient connected to face mask oxygen ? ?Post-op Assessment: Report given to RN, Post -op Vital signs reviewed and stable and Patient moving all extremities X 4 ? ?Post vital signs: Reviewed and stable ? ?Last Vitals:  ?Vitals Value Taken Time  ?BP 138/82 02/21/22 1126  ?Temp    ?Pulse 58 02/21/22 1130  ?Resp 13 02/21/22 1130  ?SpO2 98 % 02/21/22 1130  ?Vitals shown include unvalidated device data. ? ?Last Pain:  ?Vitals:  ? 02/21/22 0832  ?TempSrc:   ?PainSc: 0-No pain  ?   ? ?  ? ?Complications: No notable events documented. ?

## 2022-02-21 NOTE — Anesthesia Procedure Notes (Signed)
Procedure Name: LMA Insertion ?Date/Time: 02/21/2022 10:11 AM ?Performed by: Jonna Munro, CRNA ?Pre-anesthesia Checklist: Patient identified, Emergency Drugs available, Suction available, Patient being monitored and Timeout performed ?Patient Re-evaluated:Patient Re-evaluated prior to induction ?Oxygen Delivery Method: Circle system utilized ?Preoxygenation: Pre-oxygenation with 100% oxygen ?Induction Type: IV induction ?LMA: LMA inserted ?LMA Size: 4.0 ?Number of attempts: 1 ?Placement Confirmation: positive ETCO2 and breath sounds checked- equal and bilateral ?Tube secured with: Tape ?Dental Injury: Teeth and Oropharynx as per pre-operative assessment  ? ? ? ? ?

## 2022-02-21 NOTE — Op Note (Addendum)
Procedure: Right spermatocelectomy. ? ?Preop diagnosis: Right spermatocele. ? ?Postop diagnosis: Same. ? ?Surgeon: Dr. Irine Seal. ? ?Anesthesia: General. ? ?Specimen: Spermatocele sac. ? ?Drains: None. ? ?EBL: 3 mL. ? ?Complications: None. ? ?Indications: The patient is a 66 year old male who has a symptomatic 3 to 4 cm right spermatocele and he is elected spermatocelectomy. ? ?Procedure: He was taken operating room was given 2 g of Ancef.  A general anesthetic was induced.  He was placed in the supine position and fitted with PAS hose.  His scrotum was clipped.  He was prepped with Betadine solution and draped in usual sterile fashion. ? ?A right anterior oblique scrotal incision was made along the skin lines approximately 4 cm in length with a knife.  The dartos was then incised with the Bovie and the testicle within the tunica vaginalis was delivered from the wound.  The tunica was opened and there was a small amount of hydrocele fluid.  A Bovie was used to increase the opening in the hydrocele sac which was then imbricated behind the testicle exposing the large superior spermatocele. ? ?The spermatocele sac was then dissected away from the testicle and cord structures using the Bovie primarily the epididymis was dissected off of the testicle and approximately two thirds of the epididymis was excised along with the spermatocele sac.  The epididymal stump was clamped and ligated with a 3-0 silk suture ligature.  Once the sac had been freed from the testicle, the rete testes was oversewn with interrupted 3-0 chromic figure-of-eight sutures as were a couple of small veins that were oozing.  Hemostasis was further achieved with the Bovie.  The testicular blood supply remained intact.  Once the spermatocelectomy was complete, 3 mL of quarter percent Marcaine without epinephrine were gently injected into the cord for cord block.  The tunica vaginalis was then secured over the area of the spermatocelectomy to reduce  scarring but I did not try to restore the tunica vaginalis.  The testicle was then returned to the right hemiscrotum and once hemostasis was secured.  The dartos was closed using a running 3-0 chromic suture.  An additional 6 mL of quarter percent Marcaine were infiltrated adjacent to the skin edges.  The skin was then closed using a running vertical mattress 4-0 chromic suture.  A dressing of 4 x 4's, fluff Kerlix and athletic supporter was applied after the wound being clean.  Sponge instrument needle counts were correct.  His anesthetic was reversed and he was moved to recovery room in stable condition.  There were no complications. ?

## 2022-02-21 NOTE — Discharge Instructions (Addendum)
You may resume the aspirin and eliquis on Friday morning if there is no evidence of bleeding.  ?

## 2022-02-22 ENCOUNTER — Encounter (HOSPITAL_COMMUNITY): Payer: Self-pay | Admitting: Urology

## 2022-02-22 LAB — SURGICAL PATHOLOGY

## 2022-03-02 ENCOUNTER — Telehealth: Payer: Self-pay

## 2022-03-02 NOTE — Telephone Encounter (Signed)
Request for surgical clearance:     Endoscopy Procedure ? ?What type of surgery is being performed?     colonoscopy ? ?When is this surgery scheduled?     TBD ? ?What type of clearance is required ?   Pharmacy ? ?Are there any medications that need to be held prior to surgery and how long? Eliquis 2 day ? ?Practice name and name of physician performing surgery?      [facility] ? ?What is your office phone and fax number?      Phone- [phone]  Fax- [phone] ? ?Anesthesia type (None, local, MAC, general) ?       [facility] ? ?

## 2022-03-03 ENCOUNTER — Other Ambulatory Visit: Payer: Self-pay

## 2022-03-03 ENCOUNTER — Telehealth: Payer: Self-pay | Admitting: *Deleted

## 2022-03-03 ENCOUNTER — Telehealth: Payer: Self-pay

## 2022-03-03 DIAGNOSIS — Z1211 Encounter for screening for malignant neoplasm of colon: Secondary | ICD-10-CM

## 2022-03-03 NOTE — Telephone Encounter (Signed)
Alert received from [employer] for increased PVC's.   ?Outreach made to Kerry Mason.  He denies an increase in noted palpitations. ?Patient is overdue for follow up to see Dr. [name] and is requesting colonoscopy clearance. ?Kerry Mason scheduled to see GT 03/10/2022 to discuss uptick in PVC's and clear for colonoscopy. ?Kerry Mason indicates understanding. ? ? ? ? ? ?  ?

## 2022-03-03 NOTE — Telephone Encounter (Signed)
Preoperative team, please contact this patient and set up a phone call appointment for further cardiac evaluation.  Thank you for your help. ? ?Jossie Ng. Mikaili Flippin NP-C ? ?  ?03/03/2022, 8:10 AM ?[place] ?[address] ?Office [phone] Fax [phone] ? ?

## 2022-03-03 NOTE — Telephone Encounter (Signed)
Pt is agreeable to plan of care for tele pre op appt 03/06/22 @ 1 pm. Med rec and consent are done ?

## 2022-03-03 NOTE — Telephone Encounter (Signed)
Called pt to offer him an appt for his colonoscopy on 03/09/22. I told pt that we have a slot on 03/09/22 at [facility] at 8:30 am. Pt is aware that he will need to arrive at [facility] with a care partner by 7 am. I told pt that he has his cardiology appt on 4/3 and if he is cleared then we can proceed as scheduled. Pt knows that if cardiology does not clear him then we will need to cancel his appt. Pt knows that he will hold Eliquis 2 days prior to his colonoscopy. Pt reports that he has access to his my chart. I told pt that I will send his instructions to his my chart for his review. Pt reports that he still has the prep that was given to him when he was in the office. Pt verbalized understanding of all information and had no concerns at the end of this call. ? ?Ambulatory referral to GI in epic. ?

## 2022-03-03 NOTE — Telephone Encounter (Signed)
Patient with diagnosis of afib on Eliquis for anticoagulation.   ? ?Procedure: colonoscopy ?Date of procedure: TBD ? ?CHA2DS2-VASc Score = 4  ?This indicates a 4.8% annual risk of stroke. ?The patient's score is based upon: ?CHF History: 1 ?HTN History: 1 ?Diabetes History: 0 ?Stroke History: 0 ?Vascular Disease History: 1 ?Age Score: 1 ?Gender Score: 0 ?  ?CrCl >167m/min ?Platelet count 293K ? ?Per office protocol, patient can hold Eliquis for 2 days prior to procedure as requested.   ?

## 2022-03-03 NOTE — Telephone Encounter (Signed)
----- Message from Yetta Flock, MD sent at 03/03/2022  3:19 PM EDT ----- ?Regarding: RE: List for hospital colon ?Okay thanks. Need to await his cardiology visit first.  ? ?----- Message ----- ?From: Cardell Peach I, CMA ?Sent: 03/03/2022   2:16 PM EDT ?To: Yetta Flock, MD, # ?Subject: RE: List for hospital colon                   ? ?He has appointment with Cardiology 4/3. ?----- Message ----- ?From: Yetta Flock, MD ?Sent: 03/02/2022   3:37 PM EDT ?To: Levin Erp, PA, # ?Subject: RE: List for hospital colon                   ? ?He is on our list. We are booking about 3 months out for screening. I don't think this is urgent. ? ?That being said, Imelda Dandridge if our other patient cancels for next week Harlow Mares?) then we could see if we could fit in this patient. He would need to hold Eliquis for 2 days. JLL has he been cleared by cardiology to proceed? ? ?Thanks ? ?----- Message ----- ?From: Levin Erp, PA ?Sent: 03/02/2022   8:23 AM EDT ?To: Yetta Flock, MD, Eleanora Neighbor, CMA ?Subject: FW: List for hospital colon                   ? ?Please just make sure to update the patient. ? ?Dr. Havery Moros- not sure if someone else could do this? ? ?Thanks-JLL ?----- Message ----- ?From: Noralyn Pick, NP ?Sent: 03/01/2022   8:30 PM EDT ?To: Levin Erp, PA, # ?Subject: RE: List for hospital colon                   ? ?Anderson Malta, I am forwarding this msg to you as you saw the patient recently.  ?----- Message ----- ?From: Cardell Peach I, CMA ?Sent: 02/28/2022   9:40 AM EDT ?To: Noralyn Pick, NP ?Subject: FW: List for hospital colon                   ? ?FYI ?----- Message ----- ?From: Roetta Sessions, CMA ?Sent: 02/27/2022   5:05 PM EDT ?To: Eleanora Neighbor, CMA ?Subject: RE: List for hospital colon                   ? ?[facility] has other patient's on the waitlist that have to be offered that date first. ?Thank you ? ?Jan ?----- Message  ----- ?From: Cardell Peach I, CMA ?Sent: 02/24/2022  10:08 AM EDT ?To: Roetta Sessions, CMA ?Subject: FW: List for hospital colon                   ? ?Can this patient be done 6/19? ?----- Message ----- ?From: Marlon Pel, RN ?Sent: 01/19/2022  12:28 PM EDT ?To: Eleanora Neighbor, CMA ?Subject: RE: List for hospital colon                   ? ?This has not been created yet.  Will need to keep that for now.  Once the list has all the criteria we will let all know.  ?----- Message ----- ?From: Cardell Peach I, CMA ?Sent: 01/13/2022  11:03 AM EST ?To: Marlon Pel, RN ?Subject: List for hospital colon                       ? ?  Patient needs added to [facility] colon cancer screening at [facility] wait list for screening. ? ? ? ? ? ? ? ? ? ? ?

## 2022-03-03 NOTE — Telephone Encounter (Signed)
Pt is agreeable to plan of care for tele pre op appt 03/06/22 @ 1 pm. Med rec and consent are done.  ? ?  ?Patient Consent for Virtual Visit  ? ? ?   ? ?Kerry Mason has provided verbal consent on 03/03/2022 for a virtual visit (video or telephone). ? ? ?CONSENT FOR VIRTUAL VISIT FOR:  Kerry Mason  ?By participating in this virtual visit I agree to the following: ? ?I hereby voluntarily request, consent and authorize [facility] and its employed or contracted physicians, physician assistants, nurse practitioners or other licensed health care professionals (the Practitioner), to provide me with telemedicine health care services (the ?Services") as deemed necessary by the treating Practitioner. I acknowledge and consent to receive the Services by the Practitioner via telemedicine. I understand that the telemedicine visit will involve communicating with the Practitioner through live audiovisual communication technology and the disclosure of certain medical information by electronic transmission. I acknowledge that I have been given the opportunity to request an in-person assessment or other available alternative prior to the telemedicine visit and am voluntarily participating in the telemedicine visit. ? ?I understand that I have the right to withhold or withdraw my consent to the use of telemedicine in the course of my care at any time, without affecting my right to future care or treatment, and that the Practitioner or I may terminate the telemedicine visit at any time. I understand that I have the right to inspect all information obtained and/or recorded in the course of the telemedicine visit and may receive copies of available information for a reasonable fee.  I understand that some of the potential risks of receiving the Services via telemedicine include:  ?Delay or interruption in medical evaluation due to technological equipment failure or disruption; ?Information transmitted may not be sufficient (e.g.  poor resolution of images) to allow for appropriate medical decision making by the Practitioner; and/or  ?In rare instances, security protocols could fail, causing a breach of personal health information. ? ?Furthermore, I acknowledge that it is my responsibility to provide information about my medical history, conditions and care that is complete and accurate to the best of my ability. I acknowledge that Practitioner's advice, recommendations, and/or decision may be based on factors not within their control, such as incomplete or inaccurate data provided by me or distortions of diagnostic images or specimens that may result from electronic transmissions. I understand that the practice of medicine is not an exact science and that Practitioner makes no warranties or guarantees regarding treatment outcomes. I acknowledge that a copy of this consent can be made available to me via my patient portal ([facility]), or I can request a printed copy by calling the office of [facility].   ? ?I understand that my insurance will be billed for this visit.  ? ?I have read or had this consent read to me. ?I understand the contents of this consent, which adequately explains the benefits and risks of the Services being provided via telemedicine.  ?I have been provided ample opportunity to ask questions regarding this consent and the Services and have had my questions answered to my satisfaction. ?I give my informed consent for the services to be provided through the use of telemedicine in my medical care ? ? ? ?

## 2022-03-06 ENCOUNTER — Ambulatory Visit (INDEPENDENT_AMBULATORY_CARE_PROVIDER_SITE_OTHER): Payer: Medicare Other | Admitting: Physician Assistant

## 2022-03-06 DIAGNOSIS — Z01810 Encounter for preprocedural cardiovascular examination: Secondary | ICD-10-CM

## 2022-03-06 NOTE — Telephone Encounter (Signed)
Patient had pre-op telehealth visit today and was advised of the following: ? ?1.  Preoperative Cardiovascular Risk Assessment: ?            -Patient has upcoming colonoscopy.  He denies any recent chest pain or worsening dyspnea.  He underwent a recent procedure and tolerated well without complication.  He may hold Eliquis for 2 days, he may also hold turmeric and fish oil for 7 days prior to the procedure and restart as soon as possible afterward at his GI doctor's discretion. ? ?Patient has been notified to hold his Eliquis 2 days before procedure. ?

## 2022-03-06 NOTE — Progress Notes (Signed)
? ?Virtual Visit via Telephone Note  ? ?This visit type was conducted due to national recommendations for restrictions regarding the COVID-19 Pandemic (e.g. social distancing) in an effort to limit this patient's exposure and mitigate transmission in our community.  Due to his co-morbid illnesses, this patient is at least at moderate risk for complications without adequate follow up.  This format is felt to be most appropriate for this patient at this time.  The patient did not have access to video technology/had technical difficulties with video requiring transitioning to audio format only (telephone).  All issues noted in this document were discussed and addressed.  No physical exam could be performed with this format.  Please refer to the patient's chart for his  consent to telehealth for [facility]. ? ?Evaluation Performed:  Preoperative cardiovascular risk assessment ?_____________  ? ?Date:  03/06/2022  ? ?Patient ID:  Kerry Mason, DOB 12-Apr-1956, MRN [id number] ?Patient Location:  ?Home ?Provider location:   ?Office ? ?Primary Care Provider:  [name], NP ?Primary Cardiologist:  Peter Martinique, MD ? ?Chief Complaint  ?  ?66 y.o. y/o male with a h/o CAD s/p CABG x 2 with multiple PCIs, chronic systolic heart failure s/p ICD, PAF on Eliquis, AAA, hypertension, hyperlipidemia and tobacco use, who is pending colonoscopy, and presents today for telephonic preoperative cardiovascular risk assessment. ? ?Past Medical History  ?  ?Past Medical History:  ?Diagnosis Date  ? Acute systolic CHF (congestive heart failure) ([place])   ? AICD (automatic cardioverter/defibrillator) present   ? Anxiety   ? Ascending aortic aneurysm 08/31/2017  ? Back pain 08/31/2017  ? Basal cell carcinoma   ? left arm  ? CAD (coronary artery disease)   ? a. 2000: s/p stent of RCA 2000 with BMS  b. 2015: STEMI s/p LHC with old occlusion of RCA and DESx2 to LAD  c. 01/04/18: complicated PCI on 3/2 for CTO of mid RCA with coronary  perforation and cardiac tamponade requiring emergent pericardiocentesis     ? Cardiac tamponade   ? a. 02/03/15 2/2 coronary perforation during CTO procedure. Sealed with graftmaster coated stent.   ? COPD (chronic obstructive pulmonary disease) ([place]) 08/31/2017  ? Gout   ? History of pneumonia   ? HTN (hypertension)   ? Hyperlipidemia   ? Ischemic cardiomyopathy   ? a. 2D ECHO: EF 25-30%. Akinesis of the anteroseptal and  ? Left main coronary artery disease   ? Myocardial infarction [facility])   ? Obesity   ? S/P CABG x 2 09/02/2015  ? LIMA to LAD, SVG to ramus intermediate branch, EVH via right thigh   ? Shortness of breath dyspnea   ? Tobacco abuse   ? Urinary frequency   ? ?Past Surgical History:  ?Procedure Laterality Date  ? CARDIAC CATHETERIZATION    ? CARDIAC CATHETERIZATION N/A 06/14/2015  ? Procedure: Left Heart Cath and Coronary Angiography;  Surgeon: Lorretta Harp, MD;  Location: [place] CV LAB;  Service: Cardiovascular;  Laterality: N/A;  ? CARDIAC CATHETERIZATION N/A 08/18/2015  ? Procedure: Intravascular Pressure Wire/FFR Study;  Surgeon: Peter M Martinique, MD;  Location: [place] CV LAB;  Service: Cardiovascular;  Laterality: N/A;  ? COLONOSCOPY    ? CORONARY ANGIOPLASTY  2000  ? CORONARY ANGIOPLASTY WITH STENT PLACEMENT  2015  ? CORONARY ARTERY BYPASS GRAFT N/A 09/02/2015  ? Procedure: CORONARY ARTERY BYPASS GRAFTING (CABG) x 2 (LIMA-LAD, SVG-Intermediate) ENDOSCOPIC GREATER SAPHENOUS VEIN HARVEST RIGHT THIGH;  Surgeon: Rexene Alberts, MD;  Location: [facility] OR;  Service: Open Heart Surgery;  Laterality: N/A;  ? CORONARY BALLOON ANGIOPLASTY N/A 01/04/2021  ? Procedure: CORONARY BALLOON ANGIOPLASTY;  Surgeon: Jettie Booze, MD;  Location: [place] CV LAB;  Service: Cardiovascular;  Laterality: N/A;  ? FOOT SURGERY    ? IMPLANTABLE CARDIOVERTER DEFIBRILLATOR IMPLANT N/A 04/17/2014  ? Procedure: IMPLANTABLE CARDIOVERTER DEFIBRILLATOR IMPLANT;  Surgeon: Evans Lance, MD;  Location: [facility] CATH LAB;   Service: Cardiovascular;  Laterality: N/A;  ? LEFT HEART CATH AND CORONARY ANGIOGRAPHY N/A 01/04/2021  ? Procedure: LEFT HEART CATH AND CORONARY ANGIOGRAPHY;  Surgeon: Jettie Booze, MD;  Location: [place] CV LAB;  Service: Cardiovascular;  Laterality: N/A;  ? LEFT HEART CATH AND CORS/GRAFTS ANGIOGRAPHY N/A 08/30/2017  ? Procedure: LEFT HEART CATH AND CORS/GRAFTS ANGIOGRAPHY;  Surgeon: Nelva Bush, MD;  Location: [place] CV LAB;  Service: Cardiovascular;  Laterality: N/A;  ? LEFT HEART CATHETERIZATION WITH CORONARY ANGIOGRAM N/A 12/11/2013  ? Procedure: LEFT HEART CATHETERIZATION WITH CORONARY ANGIOGRAM;  Surgeon: Peter M Martinique, MD;  Location: [facility] CATH LAB;  Service: Cardiovascular;  Laterality: N/A;  ? LEFT HEART CATHETERIZATION WITH CORONARY ANGIOGRAM N/A 01/05/2015  ? Procedure: LEFT HEART CATHETERIZATION WITH CORONARY ANGIOGRAM;  Surgeon: Peter M Martinique, MD;  Location: [facility] CATH LAB;  Service: Cardiovascular;  Laterality: N/A;  ? PERCUTANEOUS CORONARY STENT INTERVENTION (PCI-S)  12/11/2013  ? Procedure: PERCUTANEOUS CORONARY STENT INTERVENTION (PCI-S);  Surgeon: Peter M Martinique, MD;  Location: [facility] CATH LAB;  Service: Cardiovascular;;  prov LAD and mid LAD  ? PERCUTANEOUS CORONARY STENT INTERVENTION (PCI-S) N/A 02/03/2015  ? Procedure: PERCUTANEOUS CORONARY STENT INTERVENTION (PCI-S);  Surgeon: Jettie Booze, MD;  Location: [facility] CATH LAB;  Service: Cardiovascular;  Laterality: N/A;  ? SPERMATOCELECTOMY Right 02/21/2022  ? Procedure: RIGHT SPERMATOCELECTOMY;  Surgeon: Irine Seal, MD;  Location: [facility] ORS;  Service: Urology;  Laterality: Right;  ? TEE WITHOUT CARDIOVERSION N/A 09/02/2015  ? Procedure: TRANSESOPHAGEAL ECHOCARDIOGRAM (TEE);  Surgeon: Rexene Alberts, MD;  Location: [place];  Service: Open Heart Surgery;  Laterality: N/A;  ? ? ?Allergies ? ?No Known Allergies ? ?History of Present Illness  ?  ?Kerry Mason is a 66 y.o. male who presents via Engineer, civil (consulting) for a telehealth visit today.   Pt was last seen in cardiology clinic on 11/11/2021 by Dr. Martinique.  At that time Peterson Mathey was doing well.  The patient is now pending colonoscopy.  Since his last visit, he has been doing well without exertional chest pain or worsening dyspnea.  He recently underwent a different procedure and tolerated it well without complication. ? ? ?Home Medications  ?  ?Prior to Admission medications   ?Medication Sig Start Date End Date Taking? Authorizing Provider  ?acetaminophen (TYLENOL) 325 MG tablet Take 2 tablets (650 mg total) by mouth every 4 (four) hours as needed for headache or mild pain. 06/16/15   Erlene Quan, PA-C  ?allopurinol (ZYLOPRIM) 300 MG tablet Take 1 tablet (300 mg total) by mouth 2 (two) times daily. 01/04/22   [name], NP  ?apixaban (ELIQUIS) 5 MG TABS tablet Take 1 tablet (5 mg total) by mouth 2 (two) times daily. 12/19/21   Martinique, Peter M, MD  ?APPLE CIDER VINEGAR PO Take 1 capsule by mouth daily.    [provider]  ?Ascorbic Acid (VITAMIN C) 1000 MG tablet Take 1,000 mg by mouth daily.    [provider]  ?aspirin EC 81 MG tablet Take 81 mg by mouth daily.  [provider]  ?atorvastatin (LIPITOR) 80 MG tablet Take 1 tablet (80 mg total) by mouth daily. 11/03/21 05/02/22  Martinique, Peter M, MD  ?budesonide-formoterol [facility]) 160-4.5 MCG/ACT inhaler Inhale 2 puffs into the lungs 2 (two) times daily. 12/14/21   [name], NP  ?buPROPion (WELLBUTRIN XL) 150 MG 24 hr tablet TAKE 1 TABLET BY MOUTH EVERY DAY 12/14/21   Martinique, Peter M, MD  ?Calcium Carbonate-Vit D-Min (CALCIUM 1200 PO) Take 1 capsule by mouth daily.    [provider]  ?carvedilol (COREG) 6.25 MG tablet Take 1 tablet (6.25 mg total) by mouth 2 (two) times daily. 11/03/21   Martinique, Peter M, MD  ?cholecalciferol (VITAMIN D3) 25 MCG (1000 UNIT) tablet Take 1,000 Units by mouth daily.    Ledora Bottcher, PA  ?colchicine 0.6 MG tablet Take 2 tablet po one time.  Then take 1 tablet  after one hour. Then take 1 tablet daily for 5 days. 02/16/22   [name], NP  ?ezetimibe (ZETIA) 10 MG tablet Take 1 tablet (10 mg total) by mouth daily. 11/03/21   Martinique, Peter M, MD  ?furosemide (LASIX)

## 2022-03-06 NOTE — Telephone Encounter (Signed)
See separate virtual telephone visit note.   ? ?Patient has upcoming colonoscopy.  He denies any recent chest pain or worsening dyspnea.  He underwent a recent procedure and tolerated well without complication.  He may hold Eliquis for 2 days, he may also hold turmeric and fish oil for 7 days prior to the procedure and restart as soon as possible afterward at his GI doctor's discretion ?

## 2022-03-07 ENCOUNTER — Encounter (HOSPITAL_COMMUNITY): Payer: Self-pay | Admitting: Gastroenterology

## 2022-03-09 ENCOUNTER — Other Ambulatory Visit: Payer: Self-pay

## 2022-03-09 ENCOUNTER — Ambulatory Visit (HOSPITAL_COMMUNITY)
Admission: RE | Admit: 2022-03-09 | Discharge: 2022-03-09 | Disposition: A | Discharge status: Home / Self Care | Payer: Medicare Other | Attending: Gastroenterology | Admitting: Gastroenterology

## 2022-03-09 ENCOUNTER — Ambulatory Visit (HOSPITAL_COMMUNITY): Payer: Medicare Other | Admitting: Certified Registered Nurse Anesthetist

## 2022-03-09 ENCOUNTER — Ambulatory Visit (HOSPITAL_BASED_OUTPATIENT_CLINIC_OR_DEPARTMENT_OTHER): Payer: Medicare Other | Admitting: Certified Registered Nurse Anesthetist

## 2022-03-09 ENCOUNTER — Encounter (HOSPITAL_COMMUNITY): Payer: Self-pay | Admitting: Gastroenterology

## 2022-03-09 ENCOUNTER — Encounter (HOSPITAL_COMMUNITY)
Admission: RE | Disposition: A | Discharge status: Home / Self Care | Payer: Self-pay | Source: Home / Self Care | Attending: Gastroenterology

## 2022-03-09 DIAGNOSIS — Z7951 Long term (current) use of inhaled steroids: Secondary | ICD-10-CM | POA: Diagnosis not present

## 2022-03-09 DIAGNOSIS — K635 Polyp of colon: Secondary | ICD-10-CM

## 2022-03-09 DIAGNOSIS — K5730 Diverticulosis of large intestine without perforation or abscess without bleeding: Secondary | ICD-10-CM | POA: Diagnosis not present

## 2022-03-09 DIAGNOSIS — Z955 Presence of coronary angioplasty implant and graft: Secondary | ICD-10-CM | POA: Insufficient documentation

## 2022-03-09 DIAGNOSIS — I252 Old myocardial infarction: Secondary | ICD-10-CM | POA: Insufficient documentation

## 2022-03-09 DIAGNOSIS — I2510 Atherosclerotic heart disease of native coronary artery without angina pectoris: Secondary | ICD-10-CM | POA: Insufficient documentation

## 2022-03-09 DIAGNOSIS — D126 Benign neoplasm of colon, unspecified: Secondary | ICD-10-CM

## 2022-03-09 DIAGNOSIS — Z951 Presence of aortocoronary bypass graft: Secondary | ICD-10-CM | POA: Insufficient documentation

## 2022-03-09 DIAGNOSIS — I509 Heart failure, unspecified: Secondary | ICD-10-CM | POA: Diagnosis not present

## 2022-03-09 DIAGNOSIS — Z1211 Encounter for screening for malignant neoplasm of colon: Secondary | ICD-10-CM

## 2022-03-09 DIAGNOSIS — Z79899 Other long term (current) drug therapy: Secondary | ICD-10-CM | POA: Insufficient documentation

## 2022-03-09 DIAGNOSIS — I110 Hypertensive heart disease with heart failure: Secondary | ICD-10-CM | POA: Diagnosis not present

## 2022-03-09 DIAGNOSIS — Z95810 Presence of automatic (implantable) cardiac defibrillator: Secondary | ICD-10-CM | POA: Diagnosis not present

## 2022-03-09 DIAGNOSIS — Z7901 Long term (current) use of anticoagulants: Secondary | ICD-10-CM | POA: Insufficient documentation

## 2022-03-09 DIAGNOSIS — F17210 Nicotine dependence, cigarettes, uncomplicated: Secondary | ICD-10-CM | POA: Diagnosis not present

## 2022-03-09 DIAGNOSIS — K648 Other hemorrhoids: Secondary | ICD-10-CM | POA: Insufficient documentation

## 2022-03-09 DIAGNOSIS — J449 Chronic obstructive pulmonary disease, unspecified: Secondary | ICD-10-CM | POA: Diagnosis not present

## 2022-03-09 HISTORY — PX: POLYPECTOMY: SHX5525

## 2022-03-09 HISTORY — PX: COLONOSCOPY WITH PROPOFOL: SHX5780

## 2022-03-09 SURGERY — COLONOSCOPY WITH PROPOFOL
Anesthesia: Monitor Anesthesia Care

## 2022-03-09 MED ORDER — LACTATED RINGERS IV SOLN
INTRAVENOUS | Status: AC | PRN
  Administered 2022-03-09: 10 mL/h via INTRAVENOUS

## 2022-03-09 MED ORDER — SODIUM CHLORIDE 0.9 % IV SOLN: INTRAVENOUS | Status: DC

## 2022-03-09 MED ORDER — PROPOFOL 500 MG/50ML IV EMUL
INTRAVENOUS | Status: DC | PRN
Start: 2022-03-09 — End: 2022-03-09
  Administered 2022-03-09: 125 ug/kg/min via INTRAVENOUS

## 2022-03-09 MED ORDER — PHENYLEPHRINE 40 MCG/ML (10ML) SYRINGE FOR IV PUSH (FOR BLOOD PRESSURE SUPPORT)
PREFILLED_SYRINGE | INTRAVENOUS | Status: DC | PRN
  Administered 2022-03-09: 120 ug via INTRAVENOUS

## 2022-03-09 MED ORDER — PROPOFOL 10 MG/ML IV BOLUS
INTRAVENOUS | Status: DC | PRN
  Administered 2022-03-09: 20 mg via INTRAVENOUS
  Administered 2022-03-09: 40 mg via INTRAVENOUS

## 2022-03-09 SURGICAL SUPPLY — 22 items

## 2022-03-09 NOTE — Anesthesia Preprocedure Evaluation (Signed)
Anesthesia Evaluation  ?Patient identified by MRN, date of birth, ID band ?Patient awake ? ? ? ?Reviewed: ?Allergy & Precautions, NPO status , Patient's Chart, lab work & pertinent test results ? ?Airway ?Mallampati: II ? ?TM Distance: >3 FB ?Neck ROM: Full ? ? ? Dental ?no notable dental hx. ? ?  ?Pulmonary ?neg pulmonary ROS, Current Smoker and Patient abstained from smoking.,  ?  ?Pulmonary exam normal ?breath sounds clear to auscultation ? ? ? ? ? ? Cardiovascular ?hypertension, + CAD, + Past MI, + Cardiac Stents, + CABG, + Peripheral Vascular Disease and +CHF  ?Normal cardiovascular exam+ Cardiac Defibrillator ? ?Rhythm:Regular Rate:Normal ? ? ?  ?Neuro/Psych ?negative neurological ROS ? negative psych ROS  ? GI/Hepatic ?negative GI ROS, Neg liver ROS,   ?Endo/Other  ?negative endocrine ROS ? Renal/GU ?negative Renal ROS  ?negative genitourinary ?  ?Musculoskeletal ?negative musculoskeletal ROS ?(+)  ? Abdominal ?  ?Peds ?negative pediatric ROS ?(+)  Hematology ?negative hematology ROS ?(+)   ?Anesthesia Other Findings ? ? Reproductive/Obstetrics ?negative OB ROS ? ?  ? ? ? ? ? ? ? ? ? ? ? ? ? ?  ?  ? ? ? ? ? ? ? ? ?Anesthesia Physical ?Anesthesia Plan ? ?ASA: 4 ? ?Anesthesia Plan: MAC  ? ?Post-op Pain Management:   ? ?Induction: Intravenous ? ?PONV Risk Score and Plan: 1 and Propofol infusion and Treatment may vary due to age or medical condition ? ?Airway Management Planned: Nasal Cannula ? ?Additional Equipment:  ? ?Intra-op Plan:  ? ?Post-operative Plan:  ? ?Informed Consent: I have reviewed the patients History and Physical, chart, labs and discussed the procedure including the risks, benefits and alternatives for the proposed anesthesia with the patient or authorized representative who has indicated his/her understanding and acceptance.  ? ? ? ?Dental advisory given ? ?Plan Discussed with: CRNA and Surgeon ? ?Anesthesia Plan Comments:   ? ? ? ? ? ? ?Anesthesia Quick  Evaluation ? ?

## 2022-03-09 NOTE — H&P (Signed)
[facility] History and Physical ? ? ?Primary Care Physician:  [name], NP ? ? ?Reason for Procedure:   Colon cancer screening ? ?Plan:    colonoscopy ? ? ? ? ?HPI: Kerry Mason is a 66 y.o. male  here for colonoscopy screening - first time exam. Patient denies any bowel symptoms at this time on a routine basis. No family history of colon cancer known. Otherwise feels well without any cardiopulmonary symptoms today. He has PMH as below, CAD, COPD, CHF - on Eliquis, has been held for 2 days.  ? ?I have discussed risks / benefits of colonoscopy and anesthesia with him and he wishes to proceed. Further recommendations pending the results. ? ? ?Past Medical History:  ?Diagnosis Date  ? Acute systolic CHF (congestive heart failure) ([place])   ? AICD (automatic cardioverter/defibrillator) present   ? Anxiety   ? Ascending aortic aneurysm ([place]) 08/31/2017  ? Back pain 08/31/2017  ? Basal cell carcinoma   ? left arm  ? CAD (coronary artery disease)   ? a. 2000: s/p stent of RCA 2000 with BMS  b. 2015: STEMI s/p LHC with old occlusion of RCA and DESx2 to LAD  c. 07/10/75: complicated PCI on 3/2 for CTO of mid RCA with coronary perforation and cardiac tamponade requiring emergent pericardiocentesis     ? Cardiac tamponade   ? a. 02/03/15 2/2 coronary perforation during CTO procedure. Sealed with graftmaster coated stent.   ? COPD (chronic obstructive pulmonary disease) ([place]) 08/31/2017  ? Gout   ? History of pneumonia   ? HTN (hypertension)   ? Hyperlipidemia   ? Ischemic cardiomyopathy   ? a. 2D ECHO: EF 25-30%. Akinesis of the anteroseptal and  ? Left main coronary artery disease   ? Myocardial infarction [facility])   ? Obesity   ? S/P CABG x 2 09/02/2015  ? LIMA to LAD, SVG to ramus intermediate branch, EVH via right thigh   ? Shortness of breath dyspnea   ? Tobacco abuse   ? Urinary frequency   ? ? ?Past Surgical History:  ?Procedure Laterality Date  ? CARDIAC CATHETERIZATION    ? CARDIAC CATHETERIZATION N/A  06/14/2015  ? Procedure: Left Heart Cath and Coronary Angiography;  Surgeon: Lorretta Harp, MD;  Location: [place] CV LAB;  Service: Cardiovascular;  Laterality: N/A;  ? CARDIAC CATHETERIZATION N/A 08/18/2015  ? Procedure: Intravascular Pressure Wire/FFR Study;  Surgeon: Peter M Martinique, MD;  Location: [place] CV LAB;  Service: Cardiovascular;  Laterality: N/A;  ? COLONOSCOPY    ? CORONARY ANGIOPLASTY  2000  ? CORONARY ANGIOPLASTY WITH STENT PLACEMENT  2015  ? CORONARY ARTERY BYPASS GRAFT N/A 09/02/2015  ? Procedure: CORONARY ARTERY BYPASS GRAFTING (CABG) x 2 (LIMA-LAD, SVG-Intermediate) ENDOSCOPIC GREATER SAPHENOUS VEIN HARVEST RIGHT THIGH;  Surgeon: Rexene Alberts, MD;  Location: [place];  Service: Open Heart Surgery;  Laterality: N/A;  ? CORONARY BALLOON ANGIOPLASTY N/A 01/04/2021  ? Procedure: CORONARY BALLOON ANGIOPLASTY;  Surgeon: Jettie Booze, MD;  Location: [place] CV LAB;  Service: Cardiovascular;  Laterality: N/A;  ? FOOT SURGERY    ? IMPLANTABLE CARDIOVERTER DEFIBRILLATOR IMPLANT N/A 04/17/2014  ? Procedure: IMPLANTABLE CARDIOVERTER DEFIBRILLATOR IMPLANT;  Surgeon: Evans Lance, MD;  Location: [facility] CATH LAB;  Service: Cardiovascular;  Laterality: N/A;  ? LEFT HEART CATH AND CORONARY ANGIOGRAPHY N/A 01/04/2021  ? Procedure: LEFT HEART CATH AND CORONARY ANGIOGRAPHY;  Surgeon: Jettie Booze, MD;  Location: [place] CV LAB;  Service: Cardiovascular;  Laterality: N/A;  ? LEFT HEART CATH AND CORS/GRAFTS ANGIOGRAPHY N/A 08/30/2017  ? Procedure: LEFT HEART CATH AND CORS/GRAFTS ANGIOGRAPHY;  Surgeon: Nelva Bush, MD;  Location: [place] CV LAB;  Service: Cardiovascular;  Laterality: N/A;  ? LEFT HEART CATHETERIZATION WITH CORONARY ANGIOGRAM N/A 12/11/2013  ? Procedure: LEFT HEART CATHETERIZATION WITH CORONARY ANGIOGRAM;  Surgeon: Peter M Martinique, MD;  Location: [facility] CATH LAB;  Service: Cardiovascular;  Laterality: N/A;  ? LEFT HEART CATHETERIZATION WITH CORONARY ANGIOGRAM N/A 01/05/2015  ?  Procedure: LEFT HEART CATHETERIZATION WITH CORONARY ANGIOGRAM;  Surgeon: Peter M Martinique, MD;  Location: [facility] CATH LAB;  Service: Cardiovascular;  Laterality: N/A;  ? PERCUTANEOUS CORONARY STENT INTERVENTION (PCI-S)  12/11/2013  ? Procedure: PERCUTANEOUS CORONARY STENT INTERVENTION (PCI-S);  Surgeon: Peter M Martinique, MD;  Location: [facility] CATH LAB;  Service: Cardiovascular;;  prov LAD and mid LAD  ? PERCUTANEOUS CORONARY STENT INTERVENTION (PCI-S) N/A 02/03/2015  ? Procedure: PERCUTANEOUS CORONARY STENT INTERVENTION (PCI-S);  Surgeon: Jettie Booze, MD;  Location: [facility] CATH LAB;  Service: Cardiovascular;  Laterality: N/A;  ? SPERMATOCELECTOMY Right 02/21/2022  ? Procedure: RIGHT SPERMATOCELECTOMY;  Surgeon: Irine Seal, MD;  Location: [facility]S;  Service: Urology;  Laterality: Right;  ? TEE WITHOUT CARDIOVERSION N/A 09/02/2015  ? Procedure: TRANSESOPHAGEAL ECHOCARDIOGRAM (TEE);  Surgeon: Rexene Alberts, MD;  Location: [place];  Service: Open Heart Surgery;  Laterality: N/A;  ? ? ?Prior to Admission medications   ?Medication Sig Start Date End Date Taking? Authorizing Provider  ?allopurinol (ZYLOPRIM) 300 MG tablet Take 1 tablet (300 mg total) by mouth 2 (two) times daily. 01/04/22  Yes [name], NP  ?APPLE CIDER VINEGAR PO Take 1 capsule by mouth daily.   Yes [provider]  ?Ascorbic Acid (VITAMIN C) 1000 MG tablet Take 1,000 mg by mouth daily.   Yes [provider]  ?aspirin EC 81 MG tablet Take 81 mg by mouth daily.   Yes [provider]  ?atorvastatin (LIPITOR) 80 MG tablet Take 1 tablet (80 mg total) by mouth daily. 11/03/21 05/02/22 Yes Martinique, Peter M, MD  ?budesonide-formoterol [facility]) 160-4.5 MCG/ACT inhaler Inhale 2 puffs into the lungs 2 (two) times daily. 12/14/21  Yes [name], NP  ?buPROPion (WELLBUTRIN XL) 150 MG 24 hr tablet TAKE 1 TABLET BY MOUTH EVERY DAY 12/14/21  Yes Martinique, Peter M, MD  ?Calcium Carbonate-Vit D-Min (CALCIUM 1200 PO) Take 1 capsule by mouth daily.    Yes [provider]  ?carvedilol (COREG) 6.25 MG tablet Take 1 tablet (6.25 mg total) by mouth 2 (two) times daily. 11/03/21  Yes Martinique, Peter M, MD  ?cholecalciferol (VITAMIN D3) 25 MCG (1000 UNIT) tablet Take 1,000 Units by mouth daily.   Yes [facility], Tami Lin, PA  ?colchicine 0.6 MG tablet Take 2 tablet po one time.  Then take 1 tablet after one hour. Then take 1 tablet daily for 5 days. 02/16/22  Yes [name], NP  ?ezetimibe (ZETIA) 10 MG tablet Take 1 tablet (10 mg total) by mouth daily. 11/03/21  Yes Martinique, Peter M, MD  ?furosemide (LASIX) 40 MG tablet Take 1 tablet (40 mg total) by mouth daily. 11/03/21  Yes Martinique, Peter M, MD  ?HYDROcodone-acetaminophen (NORCO/VICODIN) 5-325 MG tablet Take 1 tablet by mouth every 6 (six) hours as needed for moderate pain. 02/21/22 02/21/23 Yes Irine Seal, MD  ?isosorbide mononitrate (IMDUR) 30 MG 24 hr tablet Take 1 tablet (30 mg total) by mouth daily. 11/03/21  Yes Martinique, Peter M, MD  ?  Magnesium Oxide 200 MG TABS Take 1 tablet (200 mg total) by mouth 2 (two) times daily. ?Patient taking differently: Take 200 mg by mouth daily. 01/21/21  Yes [facility], Tami Lin, PA  ?Multiple Vitamin (MULTIVITAMIN) tablet Take 1 tablet by mouth daily.   Yes [provider]  ?Na Sulfate-K Sulfate-Mg Sulf 17.5-3.13-1.6 GM/177ML SOLN  12/19/21  Yes [provider]  ?OMEGA-3 FATTY ACIDS PO Take 1 tablet by mouth daily.   Yes [provider]  ?sacubitril-valsartan (ENTRESTO) 24-26 MG Take 1 tablet by mouth 2 (two) times daily. 12/22/21  Yes Martinique, Peter M, MD  ?spironolactone (ALDACTONE) 25 MG tablet Take 25 mg by mouth daily. 01/06/22  Yes Martinique, Peter M, MD  ?tamsulosin (FLOMAX) 0.4 MG CAPS capsule Take 0.4 mg by mouth daily. 01/04/22  Yes [provider]  ?tiotropium (SPIRIVA) 18 MCG inhalation capsule Place 1 capsule (18 mcg total) into inhaler and inhale daily. 01/04/22  Yes [name], NP  ?TURMERIC PO Take 1 capsule by mouth daily.    Yes [provider]  ?acetaminophen (TYLENOL) 325 MG tablet Take 2 tablets (650 mg total) by mouth every 4 (four) hours as needed for headache or mild pain. 06/16/15   Erlene Quan, PA-C  ?apix

## 2022-03-09 NOTE — Op Note (Addendum)
[facility] ?Patient Name: Kerry Mason ?Procedure Date: 03/09/2022 ?MRN: [id number] ?Attending MD: Carlota Raspberry. Havery Moros , MD ?Date of Birth: 06-Aug-1956 ?CSN: [id number] ?Age: 66 ?Admit Type: Outpatient ?Procedure:                Colonoscopy ?Indications:              Screening for colorectal malignant neoplasm, This  ?                          is the patient's first colonoscopy ?Providers:                Carlota Raspberry. Havery Moros, MD, Ladoris Gene, RN, Cindee Salt  ?                          Teaching laboratory technician ?Referring MD:              ?Medicines:                Monitored Anesthesia Care ?Complications:            No immediate complications. Estimated blood loss:  ?                          Minimal. ?Estimated Blood Loss:     Estimated blood loss was minimal. ?Procedure:                Pre-Anesthesia Assessment: ?                          - Prior to the procedure, a History and Physical  ?                          was performed, and patient medications and  ?                          allergies were reviewed. The patient's tolerance of  ?                          previous anesthesia was also reviewed. The risks  ?                          and benefits of the procedure and the sedation  ?                          options and risks were discussed with the patient.  ?                          All questions were answered, and informed consent  ?                          was obtained. Prior Anticoagulants: The patient has  ?                          taken Eliquis (apixaban), last dose was 2 days  ?                          prior  to procedure. ASA Grade Assessment: IV - A  ?                          patient with severe systemic disease that is a  ?                          constant threat to life. After reviewing the risks  ?                          and benefits, the patient was deemed in  ?                          satisfactory condition to undergo the procedure. ?                          After obtaining  informed consent, the colonoscope  ?                          was passed under direct vision. Throughout the  ?                          procedure, the patient's blood pressure, pulse, and  ?                          oxygen saturations were monitored continuously. The  ?                          CF-HQ190L ([id number]) Olympus colonoscope was  ?                          introduced through the anus and advanced to the the  ?                          cecum, identified by appendiceal orifice and  ?                          ileocecal valve. The colonoscopy was performed  ?                          without difficulty. The patient tolerated the  ?                          procedure well. The quality of the bowel  ?                          preparation was adequate. The ileocecal valve,  ?                          appendiceal orifice, and rectum were photographed. ?Scope In: 8:29:57 AM ?Scope Out: 8:46:22 AM ?Scope Withdrawal Time: 0 hours 12 minutes 37 seconds  ?Total Procedure Duration: 0 hours 16 minutes 25 seconds  ?Findings: ?     The perianal and digital rectal examinations were normal. ?     Many medium-mouthed diverticula were found in the  entire colon - severe  ?     diverticulosis in the left colon, mild in transverse and right. ?     A 3 mm polyp was found in the descending colon. The polyp was sessile.  ?     The polyp was removed with a cold snare. Resection and retrieval were  ?     complete. ?     Internal hemorrhoids were found during retroflexion. ?     There was some superficial erythema in the distal rectum most c/w bowel  ?     prep artifact. The exam was otherwise without abnormality. ?Impression:               - Diverticulosis in the entire examined colon, high  ?                          burden in the left colon. ?                          - One 3 mm polyp in the descending colon, removed  ?                          with a cold snare. Resected and retrieved. ?                          - Internal  hemorrhoids. ?                          - The examination was otherwise normal. ?Moderate Sedation: ?     No moderate sedation, case performed with MAC ?Recommendation:           - Patient has a contact number available for  ?                          emergencies. The signs and symptoms of potential  ?                          delayed complications were discussed with the  ?                          patient. Return to normal activities tomorrow.  ?                          Written discharge instructions were provided to the  ?                          patient. ?                          - Resume previous diet. ?                          - Continue present medications. ?                          - Resume Eliquis tomorrow ?                          -  Await pathology results. ?Procedure Code(s):        --- Professional --- ?                          [phone], Colonoscopy, flexible; with removal of  ?                          tumor(s), polyp(s), or other lesion(s) by snare  ?                          technique ?Diagnosis Code(s):        --- Professional --- ?                          Z12.11, Encounter for screening for malignant  ?                          neoplasm of colon ?                          K64.8, Other hemorrhoids ?                          K63.5, Polyp of colon ?                          K57.30, Diverticulosis of large intestine without  ?                          perforation or abscess without bleeding ?CPT copyright 2019 American Medical Association. All rights reserved. ?The codes documented in this report are preliminary and upon coder review may  ?be revised to meet current compliance requirements. ?Carlota Raspberry. Kerry Skillen, MD ?03/09/2022 8:52:50 AM ?This report has been signed electronically. ?Number of Addenda: 0 ?

## 2022-03-09 NOTE — Anesthesia Postprocedure Evaluation (Signed)
Anesthesia Post Note ? ?Patient: Kerry Mason ? ?Procedure(Mason) Performed: COLONOSCOPY WITH PROPOFOL ?POLYPECTOMY ? ?  ? ?Patient location during evaluation: PACU ?Anesthesia Type: MAC ?Level of consciousness: awake and alert ?Pain management: pain level controlled ?Vital Signs Assessment: post-procedure vital signs reviewed and stable ?Respiratory status: spontaneous breathing, nonlabored ventilation, respiratory function stable and patient connected to nasal cannula oxygen ?Cardiovascular status: stable and blood pressure returned to baseline ?Postop Assessment: no apparent nausea or vomiting ?Anesthetic complications: no ? ? ?No notable events documented. ? ?Last Vitals:  ?Vitals:  ? 03/09/22 0908 03/09/22 0911  ?BP:  120/70  ?Pulse: 66 (!) 59  ?Resp: 18 17  ?Temp:    ?SpO2: 96% 95%  ?  ?Last Pain:  ?Vitals:  ? 03/09/22 0855  ?TempSrc: Oral  ?PainSc:   ? ? ?  ?  ?  ?  ?  ?  ? ?Kerry Mason ? ? ? ? ?

## 2022-03-09 NOTE — Transfer of Care (Signed)
Immediate Anesthesia Transfer of Care Note ? ?Patient: Kerry Mason ? ?Procedure(s) Performed: COLONOSCOPY WITH PROPOFOL ?POLYPECTOMY ? ?Patient Location: Endoscopy Unit ? ?Anesthesia Type:MAC ? ?Level of Consciousness: drowsy and patient cooperative ? ?Airway & Oxygen Therapy: Patient Spontanous Breathing and Patient connected to face mask oxygen ? ?Post-op Assessment: Report given to RN and Post -op Vital signs reviewed and stable ? ?Post vital signs: Reviewed and stable ? ?Last Vitals:  ?Vitals Value Taken Time  ?BP    ?Temp    ?Pulse 80 03/09/22 0855  ?Resp 20 03/09/22 0855  ?SpO2 95 % 03/09/22 0855  ?Vitals shown include unvalidated device data. ? ?Last Pain:  ?Vitals:  ? 03/09/22 0744  ?TempSrc: Oral  ?PainSc: 0-No pain  ?   ? ?  ? ?Complications: No notable events documented. ?

## 2022-03-10 ENCOUNTER — Ambulatory Visit (INDEPENDENT_AMBULATORY_CARE_PROVIDER_SITE_OTHER): Payer: Medicare Other | Admitting: Internal Medicine

## 2022-03-10 ENCOUNTER — Encounter (HOSPITAL_COMMUNITY): Payer: Self-pay | Admitting: Gastroenterology

## 2022-03-10 VITALS — BP 98/68 | HR 70 | Ht 73.0 in | Wt 229.2 lb

## 2022-03-10 DIAGNOSIS — I493 Ventricular premature depolarization: Secondary | ICD-10-CM

## 2022-03-10 DIAGNOSIS — Z95810 Presence of automatic (implantable) cardiac defibrillator: Secondary | ICD-10-CM

## 2022-03-10 DIAGNOSIS — I255 Ischemic cardiomyopathy: Secondary | ICD-10-CM | POA: Diagnosis not present

## 2022-03-10 DIAGNOSIS — I5022 Chronic systolic (congestive) heart failure: Secondary | ICD-10-CM

## 2022-03-10 LAB — SURGICAL PATHOLOGY

## 2022-03-10 NOTE — Progress Notes (Signed)
? ? ? ? ?HPI ?[name] returns today for followup. He is a pleasant 66 yo man with CAD, s/p CABG, remote PCI's, and chronic systolic heart failure, s/p [place] VDD ICD Insertion. He has done well in the interim. He denies chest pain or sob. He is still smoking but trying to stop. He is not on oxygen but has significant COPD/emphysema. He is pending colonoscopy.  ? ?No Known Allergies ? ? ?Current Outpatient Medications  ?Medication Sig Dispense Refill  ? acetaminophen (TYLENOL) 325 MG tablet Take 2 tablets (650 mg total) by mouth every 4 (four) hours as needed for headache or mild pain.    ? allopurinol (ZYLOPRIM) 300 MG tablet Take 1 tablet (300 mg total) by mouth 2 (two) times daily. 180 tablet 3  ? apixaban (ELIQUIS) 5 MG TABS tablet Take 1 tablet (5 mg total) by mouth 2 (two) times daily. 60 tablet 11  ? APPLE CIDER VINEGAR PO Take 1 capsule by mouth daily.    ? Ascorbic Acid (VITAMIN C) 1000 MG tablet Take 1,000 mg by mouth daily.    ? aspirin EC 81 MG tablet Take 81 mg by mouth daily.    ? atorvastatin (LIPITOR) 80 MG tablet Take 1 tablet (80 mg total) by mouth daily. 90 tablet 3  ? budesonide-formoterol (SYMBICORT) 160-4.5 MCG/ACT inhaler Inhale 2 puffs into the lungs 2 (two) times daily. 1 each 6  ? buPROPion (WELLBUTRIN XL) 150 MG 24 hr tablet TAKE 1 TABLET BY MOUTH EVERY DAY 90 tablet 0  ? Calcium Carbonate-Vit D-Min (CALCIUM 1200 PO) Take 1 capsule by mouth daily.    ? carvedilol (COREG) 6.25 MG tablet Take 1 tablet (6.25 mg total) by mouth 2 (two) times daily. 180 tablet 3  ? cholecalciferol (VITAMIN D3) 25 MCG (1000 UNIT) tablet Take 1,000 Units by mouth daily.    ? colchicine 0.6 MG tablet Take 2 tablet po one time.  Then take 1 tablet after one hour. Then take 1 tablet daily for 5 days. 8 tablet 2  ? ezetimibe (ZETIA) 10 MG tablet Take 1 tablet (10 mg total) by mouth daily. 90 tablet 3  ? furosemide (LASIX) 40 MG tablet Take 1 tablet (40 mg total) by mouth daily. 90 tablet 1  ? isosorbide  mononitrate (IMDUR) 30 MG 24 hr tablet Take 1 tablet (30 mg total) by mouth daily. 90 tablet 3  ? Magnesium Oxide 200 MG TABS Take 1 tablet (200 mg total) by mouth 2 (two) times daily. (Patient taking differently: Take 200 mg by mouth daily.) 60 tablet 0  ? Multiple Vitamin (MULTIVITAMIN) tablet Take 1 tablet by mouth daily.    ? nitroGLYCERIN (NITROSTAT) 0.4 MG SL tablet DISSOLVE 1 TABLET UNDER KTHE TONGUE EVERY 5 MINUTES AS NEEDED FOR CHEST PAIN. MAX 3 DOSES/15 MIN 25 tablet 1  ? OMEGA-3 FATTY ACIDS PO Take 1 tablet by mouth daily.    ? sacubitril-valsartan (ENTRESTO) 24-26 MG Take 1 tablet by mouth 2 (two) times daily. 60 tablet 6  ? sildenafil (VIAGRA) 50 MG tablet TAKE 1 TABLET BY MOUTH DAILY AS NEEDED FOR ERECTILE DYSFUNCTION. 10 tablet 3  ? spironolactone (ALDACTONE) 25 MG tablet Take 25 mg by mouth daily.    ? tamsulosin (FLOMAX) 0.4 MG CAPS capsule Take 0.4 mg by mouth daily.    ? tiotropium (SPIRIVA) 18 MCG inhalation capsule Place 1 capsule (18 mcg total) into inhaler and inhale daily. 30 capsule 12  ? TURMERIC PO Take 1 capsule by mouth daily.    ? ?  Current Facility-Administered Medications  ?Medication Dose Route Frequency Provider Last Rate Last Admin  ? nicotine (NICODERM CQ - dosed in mg/24 hr) patch 14 mg  14 mg Transdermal Daily Duke, Tami Lin, PA      ? ? ? ?Past Medical History:  ?Diagnosis Date  ? Acute systolic CHF (congestive heart failure) ([place])   ? AICD (automatic cardioverter/defibrillator) present   ? Anxiety   ? Ascending aortic aneurysm ([place]) 08/31/2017  ? Back pain 08/31/2017  ? Basal cell carcinoma   ? left arm  ? CAD (coronary artery disease)   ? a. 2000: s/p stent of RCA 2000 with BMS  b. 2015: STEMI s/p LHC with old occlusion of RCA and DESx2 to LAD  c. 12/09/15: complicated PCI on 3/2 for CTO of mid RCA with coronary perforation and cardiac tamponade requiring emergent pericardiocentesis     ? Cardiac tamponade   ? a. 02/03/15 2/2 coronary perforation during CTO procedure. Sealed  with graftmaster coated stent.   ? COPD (chronic obstructive pulmonary disease) ([place]) 08/31/2017  ? Gout   ? History of pneumonia   ? HTN (hypertension)   ? Hyperlipidemia   ? Ischemic cardiomyopathy   ? a. 2D ECHO: EF 25-30%. Akinesis of the anteroseptal and  ? Left main coronary artery disease   ? Myocardial infarction [facility])   ? Obesity   ? S/P CABG x 2 09/02/2015  ? LIMA to LAD, SVG to ramus intermediate branch, EVH via right thigh   ? Shortness of breath dyspnea   ? Tobacco abuse   ? Urinary frequency   ? ? ?ROS: ? ? All systems reviewed and negative except as noted in the HPI. ? ? ?Past Surgical History:  ?Procedure Laterality Date  ? CARDIAC CATHETERIZATION    ? CARDIAC CATHETERIZATION N/A 06/14/2015  ? Procedure: Left Heart Cath and Coronary Angiography;  Surgeon: Lorretta Harp, MD;  Location: [place] [facility];  Service: Cardiovascular;  Laterality: N/A;  ? CARDIAC CATHETERIZATION N/A 08/18/2015  ? Procedure: Intravascular Pressure Wire/FFR Study;  Surgeon: Peter M Martinique, MD;  Location: [place] [facility];  Service: Cardiovascular;  Laterality: N/A;  ? COLONOSCOPY    ? COLONOSCOPY WITH PROPOFOL N/A 03/09/2022  ? Procedure: COLONOSCOPY WITH PROPOFOL;  Surgeon: Yetta Flock, MD;  Location: [facility];  Service: Gastroenterology;  Laterality: N/A;  ? CORONARY ANGIOPLASTY  2000  ? CORONARY ANGIOPLASTY WITH STENT PLACEMENT  2015  ? CORONARY ARTERY BYPASS GRAFT N/A 09/02/2015  ? Procedure: CORONARY ARTERY BYPASS GRAFTING (CABG) x 2 (LIMA-LAD, SVG-Intermediate) ENDOSCOPIC GREATER SAPHENOUS VEIN HARVEST RIGHT THIGH;  Surgeon: Rexene Alberts, MD;  Location: [place];  Service: Open Heart Surgery;  Laterality: N/A;  ? CORONARY BALLOON ANGIOPLASTY N/A 01/04/2021  ? Procedure: CORONARY BALLOON ANGIOPLASTY;  Surgeon: Jettie Booze, MD;  Location: [place] [facility];  Service: Cardiovascular;  Laterality: N/A;  ? FOOT SURGERY    ? IMPLANTABLE CARDIOVERTER DEFIBRILLATOR IMPLANT N/A 04/17/2014  ? Procedure:  IMPLANTABLE CARDIOVERTER DEFIBRILLATOR IMPLANT;  Surgeon: Evans Lance, MD;  Location: [facility] CATH LAB;  Service: Cardiovascular;  Laterality: N/A;  ? LEFT HEART CATH AND CORONARY ANGIOGRAPHY N/A 01/04/2021  ? Procedure: LEFT HEART CATH AND CORONARY ANGIOGRAPHY;  Surgeon: Jettie Booze, MD;  Location: [place] [facility];  Service: Cardiovascular;  Laterality: N/A;  ? LEFT HEART CATH AND CORS/GRAFTS ANGIOGRAPHY N/A 08/30/2017  ? Procedure: LEFT HEART CATH AND CORS/GRAFTS ANGIOGRAPHY;  Surgeon: Nelva Bush, MD;  Location: [place] [facility];  Service: Cardiovascular;  Laterality: N/A;  ? LEFT HEART CATHETERIZATION WITH CORONARY ANGIOGRAM N/A 12/11/2013  ? Procedure: LEFT HEART CATHETERIZATION WITH CORONARY ANGIOGRAM;  Surgeon: Peter M Martinique, MD;  Location: [facility] CATH LAB;  Service: Cardiovascular;  Laterality: N/A;  ? LEFT HEART CATHETERIZATION WITH CORONARY ANGIOGRAM N/A 01/05/2015  ? Procedure: LEFT HEART CATHETERIZATION WITH CORONARY ANGIOGRAM;  Surgeon: Peter M Martinique, MD;  Location: [facility] CATH LAB;  Service: Cardiovascular;  Laterality: N/A;  ? PERCUTANEOUS CORONARY STENT INTERVENTION (PCI-S)  12/11/2013  ? Procedure: PERCUTANEOUS CORONARY STENT INTERVENTION (PCI-S);  Surgeon: Peter M Martinique, MD;  Location: [facility] CATH LAB;  Service: Cardiovascular;;  prov LAD and mid LAD  ? PERCUTANEOUS CORONARY STENT INTERVENTION (PCI-S) N/A 02/03/2015  ? Procedure: PERCUTANEOUS CORONARY STENT INTERVENTION (PCI-S);  Surgeon: Jettie Booze, MD;  Location: [facility] CATH LAB;  Service: Cardiovascular;  Laterality: N/A;  ? POLYPECTOMY  03/09/2022  ? Procedure: POLYPECTOMY;  Surgeon: Yetta Flock, MD;  Location: [facility];  Service: Gastroenterology;;  ? SPERMATOCELECTOMY Right 02/21/2022  ? Procedure: RIGHT SPERMATOCELECTOMY;  Surgeon: Irine Seal, MD;  Location: [facility];  Service: Urology;  Laterality: Right;  ? TEE WITHOUT CARDIOVERSION N/A 09/02/2015  ? Procedure: TRANSESOPHAGEAL ECHOCARDIOGRAM (TEE);  Surgeon: Rexene Alberts, MD;   Location: [place];  Service: Open Heart Surgery;  Laterality: N/A;  ? ? ? ?Family History  ?Problem Relation Age of Onset  ? CAD Father   ?     PTCA  ? Cancer Mother   ?     LYMPHOMA  ? ? ? ?Social History  ?

## 2022-03-10 NOTE — Patient Instructions (Addendum)
Medication Instructions:  ?Your physician recommends that you continue on your current medications as directed. Please refer to the Current Medication list given to you today. ? ?Labwork: ?None ordered. ? ?Testing/Procedures: ?None ordered. ? ?Follow-Up: ?Your physician wants you to follow-up in: one year with Cristopher Peru, MD or one of the following Advanced Practice Providers on your designated Care Team:   ?Tommye Standard, PA-C ?Legrand Como "Jonni Sanger" Kirkwood, PA-C ?You will receive a reminder letter in the mail two months in advance. If you don't receive a letter, please call our office to schedule the follow-up appointment. ? ?Remote monitoring is used to monitor your ICD from home. This monitoring reduces the number of office visits required to check your device to one time per year. It allows [place] to keep an eye on the functioning of your device to ensure it is working properly. You are scheduled for a device check from home on 04/13/2022. You may send your transmission at any time that day. If you have a wireless device, the transmission will be sent automatically. After your physician reviews your transmission, you will receive a postcard with your next transmission date. ? ?Any Other Special Instructions Will Be Listed Below (If Applicable). ? ?If you need a refill on your cardiac medications before your next appointment, please call your pharmacy.  ? ? ? ? ?

## 2022-03-13 ENCOUNTER — Other Ambulatory Visit: Payer: Self-pay | Admitting: Cardiology

## 2022-03-23 ENCOUNTER — Encounter: Payer: Self-pay | Admitting: Pharmacist

## 2022-03-23 ENCOUNTER — Ambulatory Visit (INDEPENDENT_AMBULATORY_CARE_PROVIDER_SITE_OTHER): Payer: Medicare Other | Admitting: Pharmacist

## 2022-03-23 VITALS — BP 106/70 | HR 73 | Ht 73.0 in | Wt 231.8 lb

## 2022-03-23 DIAGNOSIS — I10 Essential (primary) hypertension: Secondary | ICD-10-CM

## 2022-03-23 DIAGNOSIS — Z720 Tobacco use: Secondary | ICD-10-CM

## 2022-03-23 DIAGNOSIS — F17200 Nicotine dependence, unspecified, uncomplicated: Secondary | ICD-10-CM

## 2022-03-23 DIAGNOSIS — I5022 Chronic systolic (congestive) heart failure: Secondary | ICD-10-CM

## 2022-03-23 DIAGNOSIS — I255 Ischemic cardiomyopathy: Secondary | ICD-10-CM | POA: Diagnosis not present

## 2022-03-23 MED ORDER — NICOTINE 21 MG/24HR TD PT24: 21.0000 mg | MEDICATED_PATCH | Freq: Every day | TRANSDERMAL | 2 refills | Status: DC

## 2022-03-23 MED ORDER — EMPAGLIFLOZIN 10 MG PO TABS: 10.0000 mg | ORAL_TABLET | Freq: Every day | ORAL | 1 refills | Status: DC

## 2022-03-23 MED ORDER — NICOTINE POLACRILEX 4 MG MT LOZG: 4.0000 mg | LOZENGE | OROMUCOSAL | 2 refills | Status: DC | PRN

## 2022-03-23 NOTE — Patient Instructions (Addendum)
It was nice meeting you today ? ?We would like to keep your blood pressure less than 130/80 ? ?Please continue your: ? ?Entresto 24-'26mg'$  twice a day ?Carvedilol 6.'25mg'$  twice a day ?Spironolactone '25mg'$  twice a week ? ?We will add on a new medication called Jardiance '10mg'$ .  Take 1 tablet in the morning ? ?I will also add nicotine patches and nicotine lozenges to help quit smoking ? ?You can also call the [place] quit line ? ?Please call with any questions! ? ?Karren Cobble, PharmD, BCACP, [place], CPP ?[address], [address] ?[place], [place], [address] ?Phone: [phone], Fax: [phone]  ? ? ?

## 2022-03-23 NOTE — Progress Notes (Signed)
Patient ID: Kerry Mason                 DOB: 1956/09/16                      MRN: [id number]     HPI: Kerry Mason is a 66 y.o. male referred by Dr. Martinique to pharmacy clinic for HF medication management. PMH is significant for CHF, CAD, COPD, smoking, NSTEMI, and A fib. Most recent LVEF 25-30% on 01/04/21.  Patient presents today in good spirits.  Denies chest pain, swellings, SOB, fatigue, or lightheadedness. Reports he feels very well.  Able to complete all ADLs and is going fishing this weekend.  At last PharmD visit, spironolactone was held due to concern it was causing gout flares.  Gout resolved after discontinuing. Now takes spironolactone twice weekly and does not want to increase frequency.  Has reduced salt intake.  In January 2023 Dr Martinique started patient on bupropion for smoking cessation. Unforunately he reports it was ineffective and he continues to smoke 1/2 pack per day. Would like to quit. Reports he smokes when he he is not doing anything. However if he is busy he rarely smokes.  Current CHF meds:  Entresto 24-'26mg'$  BID Spironolactone '25mg'$  twice weekly Carvedilol 6.'25mg'$  BID Furosemide '40mg'$  daily Isosorbide '30mg'$  daily  BP goal: <130/80  Wt Readings from Last 3 Encounters:  03/23/22 231 lb 12.8 oz (105.1 kg)  03/10/22 229 lb 3.2 oz (104 kg)  03/09/22 220 lb (99.8 kg)   BP Readings from Last 3 Encounters:  03/10/22 98/68  03/09/22 120/70  02/21/22 108/62   Pulse Readings from Last 3 Encounters:  03/10/22 70  03/09/22 (!) 59  02/21/22 63    Renal function: CrCl cannot be calculated (Patient's most recent lab result is older than the maximum 21 days allowed.).  Past Medical History:  Diagnosis Date   Acute systolic CHF (congestive heart failure) (HCC)    AICD (automatic cardioverter/defibrillator) present    Anxiety    Ascending aortic aneurysm ([place]) 08/31/2017   Back pain 08/31/2017   Basal cell carcinoma    left arm   CAD (coronary artery disease)     a. 2000: s/p stent of RCA 2000 with BMS  b. 2015: STEMI s/p LHC with old occlusion of RCA and DESx2 to LAD  c. 03/10/95: complicated PCI on 3/2 for CTO of mid RCA with coronary perforation and cardiac tamponade requiring emergent pericardiocentesis      Cardiac tamponade    a. 02/03/15 2/2 coronary perforation during CTO procedure. Sealed with graftmaster coated stent.    COPD (chronic obstructive pulmonary disease) ([place]) 08/31/2017   Gout    History of pneumonia    HTN (hypertension)    Hyperlipidemia    Ischemic cardiomyopathy    a. 2D ECHO: EF 25-30%. 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     allopurinol (ZYLOPRIM) 300 MG tablet  Take 1 tablet (300 mg total) by mouth 2 (two) times daily. 180 tablet 3   apixaban (ELIQUIS) 5 MG TABS tablet Take 1 tablet (5 mg total) by mouth 2 (two) times daily. 60 tablet 11   APPLE CIDER VINEGAR PO Take 1 capsule by mouth daily.     Ascorbic Acid (VITAMIN C) 1000 MG tablet Take 1,000 mg by mouth daily.     aspirin EC 81 MG tablet Take 81 mg by mouth daily.     atorvastatin (LIPITOR) 80 MG tablet Take 1 tablet (80 mg total) by mouth daily. 90 tablet 3   budesonide-formoterol (SYMBICORT) 160-4.5 MCG/ACT inhaler Inhale 2 puffs into the lungs 2 (two) times daily. 1 each 6   buPROPion (WELLBUTRIN XL) 150 MG 24 hr tablet TAKE 1 TABLET BY MOUTH EVERY DAY (Patient not taking: Reported on 03/23/2022) 90 tablet 0   Calcium Carbonate-Vit D-Min (CALCIUM 1200 PO) Take 1 capsule by mouth daily.     carvedilol  (COREG) 6.25 MG tablet Take 1 tablet (6.25 mg total) by mouth 2 (two) times daily. 180 tablet 3   cholecalciferol (VITAMIN D3) 25 MCG (1000 UNIT) tablet Take 1,000 Units by mouth daily.     colchicine 0.6 MG tablet Take 2 tablet po one time.  Then take 1 tablet after one hour. Then take 1 tablet daily for 5 days. 8 tablet 2   ezetimibe (ZETIA) 10 MG tablet Take 1 tablet (10 mg total) by mouth daily. 90 tablet 3   furosemide (LASIX) 40 MG tablet Take 1 tablet (40 mg total) by mouth daily. 90 tablet 1   isosorbide mononitrate (IMDUR) 30 MG 24 hr tablet Take 1 tablet (30 mg total) by mouth daily. 90 tablet 3   Magnesium Oxide 200 MG TABS Take 1 tablet (200 mg total) by mouth 2 (two) times daily. (Patient taking differently: Take 200 mg by mouth daily.) 60 tablet 0   Multiple Vitamin (MULTIVITAMIN) tablet Take 1 tablet by mouth daily.     nitroGLYCERIN (NITROSTAT) 0.4 MG SL tablet DISSOLVE 1 TABLET UNDER KTHE TONGUE EVERY 5 MINUTES AS NEEDED FOR CHEST PAIN. MAX 3 DOSES/15 MIN 25 tablet 1   OMEGA-3 FATTY ACIDS PO Take 1 tablet by mouth daily.     sacubitril-valsartan (ENTRESTO) 24-26 MG Take 1 tablet by mouth 2 (two) times daily. 60 tablet 6   sildenafil (VIAGRA) 50 MG tablet TAKE 1 TABLET BY MOUTH DAILY AS NEEDED FOR ERECTILE DYSFUNCTION. 10 tablet 3   spironolactone (ALDACTONE) 25 MG tablet Take 25 mg by mouth daily.     tamsulosin (FLOMAX) 0.4 MG CAPS capsule Take 0.4 mg by mouth daily.     tiotropium (SPIRIVA) 18 MCG inhalation capsule Place 1 capsule (18 mcg total) into inhaler and inhale daily. 30 capsule 12   TURMERIC PO Take 1 capsule by mouth daily.     Current Facility-Administered Medications on File Prior to Visit  Medication Dose Route Frequency Provider Last Rate Last Admin   nicotine (NICODERM CQ - dosed in mg/24 hr) patch 14 mg  14 mg Transdermal Daily Duke, Tami Lin, PA        No Known Allergies   Assessment/Plan:  1. CHF -  Patient BP in room 106/70 which is at goal of  <130/80 and patient feels well and is pleased with his treatment.  Would like to continue GDMT. Will start Jardiance '10mg'$  once daily in the morning. Counseled on possible adverse effects.  Discussed smoking cessation. Since bupropion was ineffective, patient  would like to try NRT.  Patient prefers to try nicotine patch and lozenge and prefers to try high dose. Counseled patient on correct use of patches and lozenges.  Also gave patient information for [facility].  Patient does not think he needs further follow up.  Start Jardiance '10mg'$  daily Start Nicotine patch '21mg'$  daily Start nicotine lozenge '4mg'$  PRN  Continue:  Entresto 24-'26mg'$  BID Spironolactone '25mg'$  twice weekly Carvedilol 6.'25mg'$  BID Furosemide '40mg'$  daily  Karren Cobble, PharmD, [place], [place], [place], [place], [place], [address] Phone: [phone], Fax: [phone]  Isosorbide '30mg'$  daily

## 2022-03-28 ENCOUNTER — Other Ambulatory Visit: Payer: Self-pay

## 2022-03-28 DIAGNOSIS — Z0000 Encounter for general adult medical examination without abnormal findings: Secondary | ICD-10-CM

## 2022-03-29 ENCOUNTER — Ambulatory Visit (INDEPENDENT_AMBULATORY_CARE_PROVIDER_SITE_OTHER): Payer: Medicare Other | Admitting: Nurse Practitioner

## 2022-03-29 ENCOUNTER — Encounter: Payer: Self-pay | Admitting: Nurse Practitioner

## 2022-03-29 VITALS — BP 96/59 | HR 67 | Temp 97.7°F | Ht 72.84 in | Wt 229.4 lb

## 2022-03-29 DIAGNOSIS — I2583 Coronary atherosclerosis due to lipid rich plaque: Secondary | ICD-10-CM

## 2022-03-29 DIAGNOSIS — E875 Hyperkalemia: Secondary | ICD-10-CM | POA: Diagnosis not present

## 2022-03-29 DIAGNOSIS — F5101 Primary insomnia: Secondary | ICD-10-CM

## 2022-03-29 DIAGNOSIS — M109 Gout, unspecified: Secondary | ICD-10-CM | POA: Diagnosis not present

## 2022-03-29 DIAGNOSIS — J449 Chronic obstructive pulmonary disease, unspecified: Secondary | ICD-10-CM

## 2022-03-29 DIAGNOSIS — I2510 Atherosclerotic heart disease of native coronary artery without angina pectoris: Secondary | ICD-10-CM

## 2022-03-29 MED ORDER — TRAZODONE HCL 50 MG PO TABS: 25.0000 mg | ORAL_TABLET | Freq: Every evening | ORAL | 3 refills | Status: DC | PRN

## 2022-03-29 MED ORDER — COLCHICINE 0.6 MG PO TABS: ORAL_TABLET | ORAL | 2 refills | Status: DC

## 2022-03-29 NOTE — Progress Notes (Signed)
Established patient visit ? ? ?Patient: Kerry Mason   DOB: 1956/11/03   66 y.o. Male  MRN: [id number] ?Visit Date: 03/29/2022 ? ? ?Chief Complaint  ?Patient presents with  ? Follow-up  ? ?Subjective  ?  ?HPI  ?Presenting for routine follow up.  ?-history of gout, CHF, and COPD ?-mot recent labs showing elevated potassium. Recheck this today.  ?-had screening colonoscopy since last seen.  ?-trouble sleeping - sometimes up until 2:30 in the morning before he is able to get to sleep. Unsure of reason why. Does not feel particularly stressed or anxious.  ? ? ?Medications: ?Outpatient Medications Prior to Visit  ?Medication Sig  ? acetaminophen (TYLENOL) 325 MG tablet Take 2 tablets (650 mg total) by mouth every 4 (four) hours as needed for headache or mild pain.  ? allopurinol (ZYLOPRIM) 300 MG tablet Take 1 tablet (300 mg total) by mouth 2 (two) times daily.  ? apixaban (ELIQUIS) 5 MG TABS tablet Take 1 tablet (5 mg total) by mouth 2 (two) times daily.  ? APPLE CIDER VINEGAR PO Take 1 capsule by mouth daily.  ? Ascorbic Acid (VITAMIN C) 1000 MG tablet Take 1,000 mg by mouth daily.  ? aspirin EC 81 MG tablet Take 81 mg by mouth daily.  ? atorvastatin (LIPITOR) 80 MG tablet Take 1 tablet (80 mg total) by mouth daily.  ? budesonide-formoterol (SYMBICORT) 160-4.5 MCG/ACT inhaler Inhale 2 puffs into the lungs 2 (two) times daily.  ? buPROPion (WELLBUTRIN XL) 150 MG 24 hr tablet TAKE 1 TABLET BY MOUTH EVERY DAY  ? Calcium Carbonate-Vit D-Min (CALCIUM 1200 PO) Take 1 capsule by mouth daily.  ? carvedilol (COREG) 6.25 MG tablet Take 1 tablet (6.25 mg total) by mouth 2 (two) times daily.  ? cholecalciferol (VITAMIN D3) 25 MCG (1000 UNIT) tablet Take 1,000 Units by mouth daily.  ? empagliflozin (JARDIANCE) 10 MG TABS tablet Take 1 tablet (10 mg total) by mouth daily.  ? ezetimibe (ZETIA) 10 MG tablet Take 1 tablet (10 mg total) by mouth daily.  ? furosemide (LASIX) 40 MG tablet Take 1 tablet (40 mg total) by mouth daily.  ?  isosorbide mononitrate (IMDUR) 30 MG 24 hr tablet Take 1 tablet (30 mg total) by mouth daily.  ? Magnesium Oxide 200 MG TABS Take 1 tablet (200 mg total) by mouth 2 (two) times daily. (Patient taking differently: Take 200 mg by mouth daily.)  ? Multiple Vitamin (MULTIVITAMIN) tablet Take 1 tablet by mouth daily.  ? nicotine (NICODERM CQ - DOSED IN MG/24 HOURS) 21 mg/24hr patch Place 1 patch (21 mg total) onto the skin daily.  ? nicotine polacrilex (COMMIT) 4 MG lozenge Take 1 lozenge (4 mg total) by mouth as needed for smoking cessation.  ? nitroGLYCERIN (NITROSTAT) 0.4 MG SL tablet DISSOLVE 1 TABLET UNDER KTHE TONGUE EVERY 5 MINUTES AS NEEDED FOR CHEST PAIN. MAX 3 DOSES/15 MIN  ? OMEGA-3 FATTY ACIDS PO Take 1 tablet by mouth daily.  ? sacubitril-valsartan (ENTRESTO) 24-26 MG Take 1 tablet by mouth 2 (two) times daily.  ? sildenafil (VIAGRA) 50 MG tablet TAKE 1 TABLET BY MOUTH DAILY AS NEEDED FOR ERECTILE DYSFUNCTION.  ? spironolactone (ALDACTONE) 25 MG tablet Take 25 mg by mouth daily. Twice weekly only  ? tamsulosin (FLOMAX) 0.4 MG CAPS capsule Take 0.4 mg by mouth daily.  ? tiotropium (SPIRIVA) 18 MCG inhalation capsule Place 1 capsule (18 mcg total) into inhaler and inhale daily.  ? TURMERIC PO Take 1 capsule by mouth daily.  ? [  DISCONTINUED] colchicine 0.6 MG tablet Take 2 tablet po one time.  Then take 1 tablet after one hour. Then take 1 tablet daily for 5 days.  ? ?No facility-administered medications prior to visit.  ? ? ?Review of Systems  ?Constitutional:  Positive for fatigue. Negative for activity change, chills and fever.  ?HENT:  Negative for congestion, postnasal drip, rhinorrhea, sinus pressure, sinus pain, sneezing and sore throat.   ?Eyes: Negative.   ?Respiratory:  Negative for cough, shortness of breath and wheezing.   ?Cardiovascular:  Negative for chest pain and palpitations.  ?Gastrointestinal:  Negative for constipation, diarrhea, nausea and vomiting.  ?Endocrine: Negative for cold  intolerance, heat intolerance, polydipsia and polyuria.  ?Genitourinary:  Negative for dysuria, frequency and urgency.  ?Musculoskeletal:  Negative for back pain and myalgias.  ?Skin:  Negative for rash.  ?Allergic/Immunologic: Negative for environmental allergies.  ?Neurological:  Negative for dizziness, weakness and headaches.  ?Psychiatric/Behavioral:  Positive for sleep disturbance. The patient is not nervous/anxious.   ? ?Last CBC ?Lab Results  ?Component Value Date  ? WBC 15.5 (H) 02/21/2022  ? HGB 13.5 02/21/2022  ? HCT 41.0 02/21/2022  ? MCV 94.3 02/21/2022  ? MCH 31.0 02/21/2022  ? RDW 16.2 (H) 02/21/2022  ? PLT 293 02/21/2022  ? ?Last metabolic panel ?Lab Results  ?Component Value Date  ? GLUCOSE 95 03/29/2022  ? NA 140 03/29/2022  ? K 4.5 03/29/2022  ? CL 103 03/29/2022  ? CO2 23 03/29/2022  ? BUN 16 03/29/2022  ? CREATININE 0.95 03/29/2022  ? EGFR 88 03/29/2022  ? CALCIUM 9.3 03/29/2022  ? PROT 7.4 12/14/2021  ? ALBUMIN 4.2 12/14/2021  ? LABGLOB 3.2 12/14/2021  ? AGRATIO 1.3 12/14/2021  ? BILITOT 0.5 12/14/2021  ? ALKPHOS 94 12/14/2021  ? AST 20 12/14/2021  ? ALT 17 12/14/2021  ? ANIONGAP 7 02/21/2022  ? ?Last lipids ?Lab Results  ?Component Value Date  ? CHOL 90 (L) 12/14/2021  ? HDL 29 (L) 12/14/2021  ? LDLCALC 46 12/14/2021  ? TRIG 70 12/14/2021  ? CHOLHDL 3.1 12/14/2021  ? ?Last hemoglobin A1c ?Lab Results  ?Component Value Date  ? HGBA1C 5.9 (H) 12/14/2021  ? ?Last thyroid functions ?Lab Results  ?Component Value Date  ? TSH 2.500 12/14/2021  ? ?  ? ? Objective  ?  ? ?Today's Vitals  ? 03/29/22 1018  ?BP: (!) 96/59  ?Pulse: 67  ?Temp: 97.7 ?F (36.5 ?C)  ?SpO2: 94%  ?Weight: 229 lb 6.4 oz (104.1 kg)  ?Height: 6' 0.84" (1.85 m)  ? ?Body mass index is 30.4 kg/m?.  ? ?BP Readings from Last 3 Encounters:  ?03/29/22 (!) 96/59  ?03/23/22 106/70  ?03/10/22 98/68  ?  ?Wt Readings from Last 3 Encounters:  ?03/29/22 229 lb 6.4 oz (104.1 kg)  ?03/23/22 231 lb 12.8 oz (105.1 kg)  ?03/10/22 229 lb 3.2 oz (104 kg)   ?  ?Physical Exam ?Vitals and nursing note reviewed.  ?Constitutional:   ?   Appearance: Normal appearance. He is well-developed.  ?HENT:  ?   Head: Normocephalic and atraumatic.  ?Eyes:  ?   Pupils: Pupils are equal, round, and reactive to light.  ?Cardiovascular:  ?   Rate and Rhythm: Normal rate and regular rhythm.  ?   Pulses: Normal pulses.  ?   Heart sounds: Normal heart sounds.  ?Pulmonary:  ?   Effort: Pulmonary effort is normal.  ?   Breath sounds: Normal breath sounds.  ?Abdominal:  ?  Palpations: Abdomen is soft.  ?Musculoskeletal:     ?   General: Normal range of motion.  ?   Cervical back: Normal range of motion and neck supple.  ?Lymphadenopathy:  ?   Cervical: No cervical adenopathy.  ?Skin: ?   General: Skin is warm and dry.  ?   Capillary Refill: Capillary refill takes less than 2 seconds.  ?Neurological:  ?   General: No focal deficit present.  ?   Mental Status: He is alert and oriented to person, place, and time.  ?Psychiatric:     ?   Mood and Affect: Mood normal.     ?   Behavior: Behavior normal.     ?   Thought Content: Thought content normal.     ?   Judgment: Judgment normal.  ?  ? ?Results for orders placed or performed in visit on 03/29/22  ?Uric acid  ?Result Value Ref Range  ? Uric Acid 2.9 (L) 3.8 - 8.4 mg/dL  ?Basic Metabolic Panel (BMET)  ?Result Value Ref Range  ? Glucose 95 70 - 99 mg/dL  ? BUN 16 8 - 27 mg/dL  ? Creatinine, Ser 0.95 0.76 - 1.27 mg/dL  ? eGFR 88 >59 mL/min/1.73  ? BUN/Creatinine Ratio 17 10 - 24  ? Sodium 140 134 - 144 mmol/L  ? Potassium 4.5 3.5 - 5.2 mmol/L  ? Chloride 103 96 - 106 mmol/L  ? CO2 23 20 - 29 mmol/L  ? Calcium 9.3 8.6 - 10.2 mg/dL  ? ? Assessment & Plan  ?  ?1. Primary insomnia ?Start trazodone 50 mg tablets.  May take 1/2 to 1 tablet at night as needed. ?- traZODone (DESYREL) 50 MG tablet; Take 0.5-1 tablets (25-50 mg total) by mouth at bedtime as needed for sleep.  Dispense: 30 tablet; Refill: 3 ? ?2. Hyperkalemia ?Check BMP during today's  visit. ?- Basic Metabolic Panel (BMET); Future ?- Uric acid; Future ?- Uric acid ?- Basic Metabolic Panel (BMET) ? ?3. Acute gouty arthropathy ?4 bouts of acute gout, patient may take colchicine 0.6 mg.  Patient to c

## 2022-03-30 ENCOUNTER — Encounter: Payer: Self-pay | Admitting: Nurse Practitioner

## 2022-03-30 LAB — BASIC METABOLIC PANEL
BUN/Creatinine Ratio: 17 (ref 10–24)
BUN: 16 mg/dL (ref 8–27)
CO2: 23 mmol/L (ref 20–29)
Calcium: 9.3 mg/dL (ref 8.6–10.2)
Chloride: 103 mmol/L (ref 96–106)
Creatinine, Ser: 0.95 mg/dL (ref 0.76–1.27)
Glucose: 95 mg/dL (ref 70–99)
Potassium: 4.5 mmol/L (ref 3.5–5.2)
Sodium: 140 mmol/L (ref 134–144)
eGFR: 88 mL/min/{1.73_m2} (ref >59)

## 2022-03-30 LAB — URIC ACID: Uric Acid: 2.9 mg/dL — ABNORMAL LOW (ref 3.8–8.4)

## 2022-04-02 DIAGNOSIS — E875 Hyperkalemia: Secondary | ICD-10-CM | POA: Insufficient documentation

## 2022-04-02 DIAGNOSIS — F5101 Primary insomnia: Secondary | ICD-10-CM | POA: Insufficient documentation

## 2022-04-04 ENCOUNTER — Other Ambulatory Visit: Payer: Self-pay | Admitting: Cardiology

## 2022-04-13 ENCOUNTER — Ambulatory Visit (INDEPENDENT_AMBULATORY_CARE_PROVIDER_SITE_OTHER): Payer: Medicaid Other

## 2022-04-13 DIAGNOSIS — I255 Ischemic cardiomyopathy: Secondary | ICD-10-CM

## 2022-04-13 LAB — CUP PACEART REMOTE DEVICE CHECK
Battery Remaining Percentage: 33 %
Battery Voltage: 3.04 V
Brady Statistic RV Percent Paced: 0 %
Date Time Interrogation Session: 20230509094319
HighPow Impedance: 87 Ohm
Implantable Lead Implant Date: 20150515
Implantable Lead Location: 753860
Implantable Lead Model: 365
Implantable Lead Serial Number: 10579264
Implantable Pulse Generator Implant Date: 20150515
Lead Channel Impedance Value: 651 Ohm
Lead Channel Sensing Intrinsic Amplitude: 19.7 mV
Lead Channel Sensing Intrinsic Amplitude: 7.2 mV
Lead Channel Setting Pacing Amplitude: 2.5 V
Lead Channel Setting Pacing Pulse Width: 0.4 ms
Lead Channel Setting Sensing Sensitivity: 0.8 mV
Pulse Gen Model: 383594
Pulse Gen Serial Number: 60800912

## 2022-04-20 NOTE — Progress Notes (Signed)
Remote ICD transmission.   

## 2022-04-21 ENCOUNTER — Other Ambulatory Visit: Payer: Self-pay | Admitting: Nurse Practitioner

## 2022-04-21 DIAGNOSIS — F5101 Primary insomnia: Secondary | ICD-10-CM

## 2022-05-11 ENCOUNTER — Other Ambulatory Visit: Payer: Self-pay | Admitting: Cardiology

## 2022-05-15 NOTE — Progress Notes (Deleted)
Cardiology Office Note    Date:  05/15/2022   ID:  Kerry Mason, DOB 1956/01/12, MRN [id number]  PCP:  [name], NP  Cardiologist:  Dr. Martinique   No chief complaint on file.   History of Present Illness:  Kerry Mason is a 66 y.o. male with CAD s/p CABG and multiple PCI, chronic systolic HF s/p ICD, HLD and tobacco abuse. He had a remote stenting of RCA in 2000 and was lost to follow-up. He ended up having anterior STEMI in January 2015, cardiac catheterization showed occluded proximal RCA with collaterals and the occlusion of the proximal LAD RCA occlusion was felt to be chronic, he was treated with drug-eluting stent to the occluded LAD. EF by cath was 35%. He subsequently required ICD placement. He had Myoview in December 2015 that was high risk with large area of anterior, septal, apical scar and inferior apical ischemia, EF was noted to be 25%. He underwent repeat cardiac cath that showed patent stents in the LAD, but first diagonal had 80% stenosis, chronically occluded RCA with collaterals. Due to his anginal symptom, he underwent a CTO of RCA, procedure was complicated by perforation of mid RCA which resulted in cardiac tamponade and required emergent pericardiocentesis and removal of 1L of blood. Perforation was sealed with Graftmaster and covered stent to the mid RCA. Remainder of his RCA was stented with 4 additional drug-eluting stents. He ended up having pericarditis afterward. He did develop brief PAF during the period of pericarditis.   He was admitted in July 2016 with NSTEMI. Cardiac catheterization showed reocclusion of mid RCA with left-to-right collaterals, he was treated medically with addition of Imdur. He also had a 50% stenosis in the left main, FFR was abnormal, he was referred for CT surgery. He underwent CABG by Dr. Roxy Manns in 9/16 with LIMA to LAD, SVG to ramus, RCA was not bypassed due to small target and significant scarring from the prior pericarditis.   He  was admitted to the hospital on 08/29/2017 with chest pain. He eventually underwent cardiac catheterization on 08/30/2017 with Dr. Saunders Revel which showed widely patent LIMA to LAD and SVG to ramus, moderate to severely reduced left ventricular contraction, multiple abnormal vessels arising from the LIMA graft, mediastinal mass is a possibility. Otherwise continue medical therapy and a secondary prevention of CAD. CT of chest showed moderate to severe upper lobe central lobar emphysema, 4.1 cm ascending aortic aneurysm, left lower paratracheal lymphadenopathy likely reactive, several scattered 2-3 mm pulmonary nodule.  He [facility] to [facility] in [facility] with chest pain. Symptoms somewhat atypical with flat HST. Given his known CAD, heart cath was repeated. PCI attempted of ostial LCx, but was unsuccessful.  He was loaded with plavix in the cath lab, but this was not continued given his need for DOAC.   Was recently seen by [facility] for follow up of AAA. Measured 5.1 cm. Noted left calf claudication and they are planneding to check ABIs on follow up visit.   He was started on aldactone but this resulted in gout flair. Dose reduced to twice a week. He was approved for [facility] and has been on this.   He now is farming full time raising rabbits, chickens, and quail. Has rare palpitations. States he was camping over a month ago. He had just finished eating and felt faint and disoriented. Diaphoresis.  This was apparently before his ICD check in November which showed no arrhythmia. Has no chest pain or dyspnea. He is smoking but wants  to try and quit again. No success with Chantix.    Past Medical History:  Diagnosis Date   Acute systolic CHF (congestive heart failure) (HCC)    AICD (automatic cardioverter/defibrillator) present    Anxiety    Ascending aortic aneurysm ([place]) 08/31/2017   Back pain 08/31/2017   Basal cell carcinoma    left arm   CAD (coronary artery disease)    a. 2000: s/p stent of RCA 2000 with BMS   b. 2015: STEMI s/p LHC with old occlusion of RCA and DESx2 to LAD  c. 02/05/45: complicated PCI on 3/2 for CTO of mid RCA with coronary perforation and cardiac tamponade requiring emergent pericardiocentesis      Cardiac tamponade    a. 02/03/15 2/2 coronary perforation during CTO procedure. Sealed with graftmaster coated stent.    COPD (chronic obstructive pulmonary disease) ([place]) 08/31/2017   Gout    History of pneumonia    HTN (hypertension)    Hyperlipidemia    Ischemic cardiomyopathy    a. 2D ECHO: EF 25-30%. Akinesis of the anteroseptal and   Left main coronary artery disease    Myocardial infarction ([place])    Obesity    S/P CABG x 2 09/02/2015   LIMA to LAD, SVG to ramus intermediate branch, EVH via right thigh    Shortness of breath dyspnea    Tobacco abuse    Urinary frequency     Past Surgical History:  Procedure Laterality Date   CARDIAC CATHETERIZATION     CARDIAC CATHETERIZATION N/A 06/14/2015   Procedure: Left Heart Cath and Coronary Angiography;  Surgeon: Lorretta Harp, MD;  Location: [place] [facility];  Service: Cardiovascular;  Laterality: N/A;   CARDIAC CATHETERIZATION N/A 08/18/2015   Procedure: Intravascular Pressure Wire/FFR Study;  Surgeon: Prateek Knipple M Martinique, MD;  Location: [place] [facility];  Service: Cardiovascular;  Laterality: N/A;   COLONOSCOPY     COLONOSCOPY WITH PROPOFOL N/A 03/09/2022   Procedure: COLONOSCOPY WITH PROPOFOL;  Surgeon: Yetta Flock, MD;  Location: [facility];  Service: Gastroenterology;  Laterality: N/A;   CORONARY ANGIOPLASTY  2000   CORONARY ANGIOPLASTY WITH STENT PLACEMENT  2015   CORONARY ARTERY BYPASS GRAFT N/A 09/02/2015   Procedure: CORONARY ARTERY BYPASS GRAFTING (CABG) x 2 (LIMA-LAD, SVG-Intermediate) ENDOSCOPIC GREATER SAPHENOUS VEIN HARVEST RIGHT THIGH;  Surgeon: Rexene Alberts, MD;  Location: [facility];  Service: Open Heart Surgery;  Laterality: N/A;   CORONARY BALLOON ANGIOPLASTY N/A 01/04/2021   Procedure: CORONARY BALLOON  ANGIOPLASTY;  Surgeon: Jettie Booze, MD;  Location: [place] [facility];  Service: Cardiovascular;  Laterality: N/A;   FOOT SURGERY     IMPLANTABLE CARDIOVERTER DEFIBRILLATOR IMPLANT N/A 04/17/2014   Procedure: IMPLANTABLE CARDIOVERTER DEFIBRILLATOR IMPLANT;  Surgeon: Evans Lance, MD;  Location: [facility] CATH LAB;  Service: Cardiovascular;  Laterality: N/A;   LEFT HEART CATH AND CORONARY ANGIOGRAPHY N/A 01/04/2021   Procedure: LEFT HEART CATH AND CORONARY ANGIOGRAPHY;  Surgeon: Jettie Booze, MD;  Location: [place] [facility];  Service: Cardiovascular;  Laterality: N/A;   LEFT HEART CATH AND CORS/GRAFTS ANGIOGRAPHY N/A 08/30/2017   Procedure: LEFT HEART CATH AND CORS/GRAFTS ANGIOGRAPHY;  Surgeon: Nelva Bush, MD;  Location: [place] [facility];  Service: Cardiovascular;  Laterality: N/A;   LEFT HEART CATHETERIZATION WITH CORONARY ANGIOGRAM N/A 12/11/2013   Procedure: LEFT HEART CATHETERIZATION WITH CORONARY ANGIOGRAM;  Surgeon: Syniyah Bourne M Martinique, MD;  Location: [facility] CATH LAB;  Service: Cardiovascular;  Laterality: N/A;   LEFT HEART CATHETERIZATION  WITH CORONARY ANGIOGRAM N/A 01/05/2015   Procedure: LEFT HEART CATHETERIZATION WITH CORONARY ANGIOGRAM;  Surgeon: Shaquille Murdy M Martinique, MD;  Location: [facility] CATH LAB;  Service: Cardiovascular;  Laterality: N/A;   PERCUTANEOUS CORONARY STENT INTERVENTION (PCI-S)  12/11/2013   Procedure: PERCUTANEOUS CORONARY STENT INTERVENTION (PCI-S);  Surgeon: Randie Bloodgood M Martinique, MD;  Location: [facility] CATH LAB;  Service: Cardiovascular;;  prov LAD and mid LAD   PERCUTANEOUS CORONARY STENT INTERVENTION (PCI-S) N/A 02/03/2015   Procedure: PERCUTANEOUS CORONARY STENT INTERVENTION (PCI-S);  Surgeon: Jettie Booze, MD;  Location: [facility] CATH LAB;  Service: Cardiovascular;  Laterality: N/A;   POLYPECTOMY  03/09/2022   Procedure: POLYPECTOMY;  Surgeon: Yetta Flock, MD;  Location: [facility];  Service: Gastroenterology;;   SPERMATOCELECTOMY Right 02/21/2022   Procedure: RIGHT  SPERMATOCELECTOMY;  Surgeon: Irine Seal, MD;  [facility]: [facility];  Service: Urology;  Laterality: Right;   TEE WITHOUT CARDIOVERSION N/A 09/02/2015   Procedure: TRANSESOPHAGEAL ECHOCARDIOGRAM (TEE);  Surgeon: Rexene Alberts, MD;  Location: [place];  Service: Open Heart Surgery;  Laterality: N/A;    Current Medications: Outpatient Medications Prior to Visit  Medication Sig Dispense Refill   acetaminophen (TYLENOL) 325 MG tablet Take 2 tablets (650 mg total) by mouth every 4 (four) hours as needed for headache or mild pain.     allopurinol (ZYLOPRIM) 300 MG tablet Take 1 tablet (300 mg total) by mouth 2 (two) times daily. 180 tablet 3   apixaban (ELIQUIS) 5 MG TABS tablet Take 1 tablet (5 mg total) by mouth 2 (two) times daily. 60 tablet 11   APPLE CIDER VINEGAR PO Take 1 capsule by mouth daily.     Ascorbic Acid (VITAMIN C) 1000 MG tablet Take 1,000 mg by mouth daily.     aspirin EC 81 MG tablet Take 81 mg by mouth daily.     atorvastatin (LIPITOR) 80 MG tablet Take 1 tablet (80 mg total) by mouth daily. 90 tablet 3   budesonide-formoterol (SYMBICORT) 160-4.5 MCG/ACT inhaler Inhale 2 puffs into the lungs 2 (two) times daily. 1 each 6   buPROPion (WELLBUTRIN XL) 150 MG 24 hr tablet TAKE 1 TABLET BY MOUTH EVERY DAY 90 tablet 0   Calcium Carbonate-Vit D-Min (CALCIUM 1200 PO) Take 1 capsule by mouth daily.     carvedilol (COREG) 6.25 MG tablet Take 1 tablet (6.25 mg total) by mouth 2 (two) times daily. 180 tablet 3   cholecalciferol (VITAMIN D3) 25 MCG (1000 UNIT) tablet Take 1,000 Units by mouth daily.     colchicine 0.6 MG tablet Take 2 tablet po one time.  Then take 1 tablet after one hour. Then take 1 tablet daily for 5 days. 40 tablet 2   empagliflozin (JARDIANCE) 10 MG TABS tablet Take 1 tablet (10 mg total) by mouth daily. 90 tablet 1   ezetimibe (ZETIA) 10 MG tablet Take 1 tablet (10 mg total) by mouth daily. 90 tablet 3   furosemide (LASIX) 40 MG tablet TAKE 1 TABLET BY MOUTH EVERY DAY 90  tablet 1   isosorbide mononitrate (IMDUR) 30 MG 24 hr tablet Take 1 tablet (30 mg total) by mouth daily. 90 tablet 3   Magnesium Oxide 200 MG TABS Take 1 tablet (200 mg total) by mouth 2 (two) times daily. (Patient taking differently: Take 200 mg by mouth daily.) 60 tablet 0   Multiple Vitamin (MULTIVITAMIN) tablet Take 1 tablet by mouth daily.     nicotine (NICODERM CQ - DOSED IN MG/24 HOURS) 21 mg/24hr patch Place 1  patch (21 mg total) onto the skin daily. 28 patch 2   nicotine polacrilex (COMMIT) 4 MG lozenge Take 1 lozenge (4 mg total) by mouth as needed for smoking cessation. 100 tablet 2   nitroGLYCERIN (NITROSTAT) 0.4 MG SL tablet DISSOLVE 1 TABLET UNDER KTHE TONGUE EVERY 5 MINUTES AS NEEDED FOR CHEST PAIN. MAX 3 DOSES/15 MIN 25 tablet 2   OMEGA-3 FATTY ACIDS PO Take 1 tablet by mouth daily.     sacubitril-valsartan (ENTRESTO) 24-26 MG Take 1 tablet by mouth 2 (two) times daily. 60 tablet 6   sildenafil (VIAGRA) 50 MG tablet TAKE 1 TABLET BY MOUTH DAILY AS NEEDED FOR ERECTILE DYSFUNCTION. 10 tablet 3   spironolactone (ALDACTONE) 25 MG tablet Take 25 mg by mouth daily. Twice weekly only     tamsulosin (FLOMAX) 0.4 MG CAPS capsule Take 0.4 mg by mouth daily.     tiotropium (SPIRIVA) 18 MCG inhalation capsule Place 1 capsule (18 mcg total) into inhaler and inhale daily. 30 capsule 12   traZODone (DESYREL) 50 MG tablet Take 0.5-1 tablets (25-50 mg total) by mouth at bedtime as needed for sleep. 30 tablet 3   TURMERIC PO Take 1 capsule by mouth daily.     No facility-administered medications prior to visit.     Allergies:   Patient has no known allergies.   Social History   Socioeconomic History   Marital status: Divorced    Spouse name: Not on file   Number of children: 2   Years of education: Not on file   Highest education level: Not on file  Occupational History   Occupation: [employer]  Tobacco Use   Smoking status: Every Day    Packs/day: 0.50    Years: 35.00     Total pack years: 17.50    Types: Cigarettes   Smokeless tobacco: Never  Vaping Use   Vaping Use: Never used  Substance and Sexual Activity   Alcohol use: No    Alcohol/week: 0.0 standard drinks of alcohol   Drug use: No   Sexual activity: Not Currently  Other Topics Concern   Not on file  Social History Narrative   Not on file   Social Determinants of Health   Financial Resource Strain: Not on file  Food Insecurity: Not on file  Transportation Needs: Not on file  Physical Activity: Not on file  Stress: Not on file  Social Connections: Not on file     Family History:  The patient's family history includes CAD in his father; Cancer in his mother.   ROS:   Please see the history of present illness.    ROS All other systems reviewed and are negative.   PHYSICAL EXAM:   VS:  There were no vitals taken for this visit.   GENERAL:  Well appearing WM in NAD HEENT:  PERRL, EOMI, sclera are clear. Oropharynx is clear. NECK:  No jugular venous distention, carotid upstroke brisk and symmetric, no bruits, no thyromegaly or adenopathy LUNGS:  Clear to auscultation bilaterally CHEST:  Unremarkable HEART:  RRR,  PMI not displaced or sustained,S1 and S2 within normal limits, no S3, no S4: no clicks, no rubs, no murmurs ABD:  Soft, nontender. BS +, no masses or bruits. No hepatomegaly, no splenomegaly EXT:  2 + pulses throughout, no edema, no cyanosis no clubbing SKIN:  Warm and dry.  No rashes NEURO:  Alert and oriented x 3. Cranial nerves II through XII intact. PSYCH:  Cognitively intact  Wt Readings from Last 3 Encounters:  03/29/22 229 lb 6.4 oz (104.1 kg)  03/23/22 231 lb 12.8 oz (105.1 kg)  03/10/22 229 lb 3.2 oz (104 kg)      Studies/Labs Reviewed:   EKG:  EKG is ordered today.  NSR with old anterior infarct. T wave inversion in the lateral leads. I have personally reviewed and interpreted this study.   Recent Labs: 12/14/2021: ALT 17; TSH 2.500 02/21/2022:  Hemoglobin 13.5; Platelets 293 03/29/2022: BUN 16; Creatinine, Ser 0.95; Potassium 4.5; Sodium 140   Lipid Panel    Component Value Date/Time   CHOL 90 (L) 12/14/2021 0919   TRIG 70 12/14/2021 0919   HDL 29 (L) 12/14/2021 0919   CHOLHDL 3.1 12/14/2021 0919   CHOLHDL 4.3 01/04/2021 0236   VLDL 34 01/04/2021 0236   LDLCALC 46 12/14/2021 0919    Additional studies/ records that were reviewed today include:   Pulmonary tests PFT 10/03/2017>> FEV1 2.57 (65%) TLC, 6.93 (91%), DLCO 60% PFT 08/31/15 >> FEV1 3.04 (75%), FEV1% 67, TLC 7.98 (104%), DLCO 61% CT chest 08/31/17 >> severe CAD, TAA 4.1 cm, mod/severe centrilobular emphysema, multiple nodules 2-3 mm  Cath 08/30/2017 FINDINGS: Stable native coronary artery disease with 50% distal LMCA and chronic total occlusion of ostial RCA. Distal RCA fills via left-to-right collaterals. Widely patent LIMA to LAD and SVG to ramus. Multiple abnormal vessels arising from the LIMA graft. Mediastinal mass is a consideration. Normal left ventricular filling pressure. Moderate to severely reduced left ventricular contraction.   RECOMMENDATIONS: Recommend CT chest to evaluate for intrathoracic mass. Continue medical therapy and secondary prevention of CAD.  Cath: 01/04/21   LM lesion is 50% stenosed. Patent LIMA to LAD. Patent SVG to ramus. 2nd Diag lesion is 70% stenosed. Mid RCA to Dist RCA lesion is 100% stenosed. Prox RCA to Mid RCA lesion is 100% stenosed. Ost RCA to Prox RCA lesion is 95% stenosed. Ost Cx lesion is 80% stenosed. This territory is not bypassed, but it is a relatively small vessel. Unable to advance balloon after successfully guiding the wire though the ramus graft and retrograde to the ostial circumflex and across the lesion. The left ventricular ejection fraction is 25-35% by visual estimate. There is moderate to severe left ventricular systolic dysfunction. LV end diastolic pressure is normal. There is no aortic valve  stenosis.   Medical therapy for CAD.  Unsuccessful attempt at PCI of ostial circumflex.  Restart IV heparin 8 hours post sheath pull.  OK to start Hadar tomorrow for atrial fibrillation.  He was loaded with Plavix but will not continue if he is starting a DOAC.  If he is not starting a DOAC, DAPT would be reasonable for his CAD.     Diagnostic Dominance: Right     Echo: 01/04/21   IMPRESSIONS     1. There is no left ventricular thrombus (Definity contrast was used).  There is apical dyskinesis. There is severe inferior wall hypokinesis and  inferoseptal akinesis.. Left ventricular ejection fraction, by estimation,  is 25 to 30%. The left ventricle  has severely decreased function. The left ventricle demonstrates regional  wall motion abnormalities (see scoring diagram/findings for description).  The left ventricular internal cavity size was moderately to severely  dilated. Left ventricular diastolic  parameters are consistent with Grade I diastolic dysfunction (impaired  relaxation).   2. Right ventricular systolic function is normal. The right ventricular  size is normal. There is normal pulmonary artery systolic pressure. The  estimated right ventricular  systolic pressure is 07.6 mmHg.   3. Left atrial size was severely dilated.   4. The mitral valve is normal in structure. Trivial mitral valve  regurgitation.   5. The aortic valve is tricuspid. There is mild calcification of the  aortic valve. There is mild thickening of the aortic valve. Aortic valve  regurgitation is not visualized. Mild aortic valve sclerosis is present,  with no evidence of aortic valve  stenosis.   6. Aortic dilatation noted. There is mild dilatation of the aortic root,  measuring 39 mm.   7. The inferior vena cava is normal in size with greater than 50%  respiratory variability, suggesting right atrial pressure of 3 mmHg.    ASSESSMENT:    No diagnosis found.    PLAN:   1. CAD s/p CABG x 2,  multiple PCI. Patent LIMA to the LAD, SVG to ramus CTO of RCA complicated by perforation, tamponade, and pericarditis Unsuccessful PCI of Cx - 01/04/21- I personally reviewed films. The angulation into the LCx is not favorable for PCI and it looks like the LCx is adequately supplied by the SVG to ramus - he has no further chest pain - continue ASA - continue imdur     2. Paroxysmal atrial fibrillation - started on eliquis 5 mg BID- he is having difficulty affording Eliquis. Will fill out patient assistance forms. If still unable to afford may have to switch to Coumadin. This patients CHA2DS2-VASc Score and unadjusted Ischemic Stroke Rate (% per year) is equal to 3.2 % stroke rate/year from a score of 3 (CHF, CAD, HTN)     3.  Chronic systolic and diastolic heart failure - echo with EF 30-35% - recommendations to transition to entresto - PA was submitted prior to discharge but this was denied- unclear why. - will continue lisinopril and lasix, on Coreg - Denied Entresto by insurance.      5. Hypertension - continue coreg, imdur, lisinopril, lasix, and spiro     6. Hyperlipidemia with LDL goal < 70 01/04/2021: Cholesterol 181; HDL 42; LDL Cholesterol 105; Triglycerides 169; VLDL 34 - continue statin - zetia was added at discharge - repeat lipids in 2 weeks. If still not at goal will refer to lipid clinic     7. Smoker - he is interested in cessation - failed Chantix. - will try Wellbutrin 150 mg daily.   8. AAA. 5.1 cm. Followed by [facility]. Also with claudication. FU dopplers.     Medication Adjustments/Labs and Tests Ordered: Current medicines are reviewed at length with the patient today.  Concerns regarding medicines are outlined above.  Medication changes, Labs and Tests ordered today are listed in the Patient Instructions below. There are no Patient Instructions on file for this visit.     Signed, Shaeleigh Graw Martinique, MD  05/15/2022 10:51 AM    [address] [place], [place], [place]  [address] Phone: [phone]; Fax: [phone]

## 2022-05-18 ENCOUNTER — Ambulatory Visit: Payer: Medicare Other | Admitting: Cardiology

## 2022-05-19 ENCOUNTER — Ambulatory Visit: Payer: Medicare Other

## 2022-05-19 ENCOUNTER — Encounter (HOSPITAL_COMMUNITY): Payer: Medicare Other

## 2022-05-19 ENCOUNTER — Other Ambulatory Visit (HOSPITAL_COMMUNITY): Payer: Medicare Other

## 2022-05-20 ENCOUNTER — Other Ambulatory Visit: Payer: Self-pay | Admitting: Nurse Practitioner

## 2022-05-20 DIAGNOSIS — F5101 Primary insomnia: Secondary | ICD-10-CM

## 2022-05-26 ENCOUNTER — Other Ambulatory Visit: Payer: Self-pay | Admitting: *Deleted

## 2022-05-26 DIAGNOSIS — I7121 Aneurysm of the ascending aorta, without rupture: Secondary | ICD-10-CM

## 2022-05-26 DIAGNOSIS — I714 Abdominal aortic aneurysm, without rupture, unspecified: Secondary | ICD-10-CM

## 2022-05-26 DIAGNOSIS — I70212 Atherosclerosis of native arteries of extremities with intermittent claudication, left leg: Secondary | ICD-10-CM

## 2022-05-28 NOTE — Progress Notes (Deleted)
Cardiology Office Note    Date:  05/28/2022   ID:  Kerry Mason, DOB [birth year], MRN [id number]  PCP:  [name], NP  Cardiologist:  Dr. Martinique   No chief complaint on file.   History of Present Illness:  Kerry Mason is a 65 y.o. male with CAD s/p CABG and multiple PCI, chronic systolic HF s/p ICD, HLD and tobacco abuse. He had a remote stenting of RCA in 2000 and was lost to follow-up. He ended up having anterior STEMI in January 2015, cardiac catheterization showed occluded proximal RCA with collaterals and the occlusion of the proximal LAD RCA occlusion was felt to be chronic, he was treated with drug-eluting stent to the occluded LAD. EF by cath was 35%. He subsequently required ICD placement. He had Myoview in December 2015 that was high risk with large area of anterior, septal, apical scar and inferior apical ischemia, EF was noted to be 25%. He underwent repeat cardiac cath that showed patent stents in the LAD, but first diagonal had 80% stenosis, chronically occluded RCA with collaterals. Due to his anginal symptom, he underwent a CTO of RCA, procedure was complicated by perforation of mid RCA which resulted in cardiac tamponade and required emergent pericardiocentesis and removal of 1L of blood. Perforation was sealed with Graftmaster and covered stent to the mid RCA. Remainder of his RCA was stented with 4 additional drug-eluting stents. He ended up having pericarditis afterward. He did develop brief PAF during the period of pericarditis.   He was admitted in July 2016 with NSTEMI. Cardiac catheterization showed reocclusion of mid RCA with left-to-right collaterals, he was treated medically with addition of Imdur. He also had a 50% stenosis in the left main, FFR was abnormal, he was referred for CT surgery. He underwent CABG by Dr. Roxy Manns in 9/16 with LIMA to LAD, SVG to ramus, RCA was not bypassed due to small target and significant scarring from the prior pericarditis.   He  was admitted to the hospital on 08/29/2017 with chest pain. He eventually underwent cardiac catheterization on 08/30/2017 with Dr. Saunders Revel which showed widely patent LIMA to LAD and SVG to ramus, moderate to severely reduced left ventricular contraction, multiple abnormal vessels arising from the LIMA graft, mediastinal mass is a possibility. Otherwise continue medical therapy and a secondary prevention of CAD. CT of chest showed moderate to severe upper lobe central lobar emphysema, 4.1 cm ascending aortic aneurysm, left lower paratracheal lymphadenopathy likely reactive, several scattered 2-3 mm pulmonary nodule.  He presented to [facility] in February with chest pain. Symptoms somewhat atypical with flat HST. Given his known CAD, heart cath was repeated. PCI attempted of ostial LCx, but was unsuccessful.  He was loaded with plavix in the cath lab, but this was not continued given his need for DOAC.   Was recently seen by [facility] for follow up of AAA. Measured 5.1 cm. Noted left calf claudication and they are planneding to check ABIs on follow up visit.   He was started on aldactone but this resulted in gout flair. Dose reduced to twice a week. He was approved for [facility] and has been on this.   He now is farming full time raising rabbits, chickens, and quail. Has rare palpitations. States he was camping over a month ago. He had just finished eating and felt faint and disoriented. Diaphoresis.  This was apparently before his ICD check in November which showed no arrhythmia. Has no chest pain or dyspnea. He is smoking but wants  to try and quit again. No success with Chantix.    Past Medical History:  Diagnosis Date   Acute systolic CHF (congestive heart failure) (HCC)    AICD (automatic cardioverter/defibrillator) present    Anxiety    Ascending aortic aneurysm ([place]) 08/31/2017   Back pain 08/31/2017   Basal cell carcinoma    left arm   CAD (coronary artery disease)    a. 2000: s/p stent of RCA 2000 with BMS   b. 2015: STEMI s/p LHC with old occlusion of RCA and DESx2 to LAD  c. 06/07/09: complicated PCI on 3/2 for CTO of mid RCA with coronary perforation and cardiac tamponade requiring emergent pericardiocentesis      Cardiac tamponade    a. 02/03/15 2/2 coronary perforation during CTO procedure. Sealed with graftmaster coated stent.    COPD (chronic obstructive pulmonary disease) ([place]) 08/31/2017   Gout    History of pneumonia    HTN (hypertension)    Hyperlipidemia    Ischemic cardiomyopathy    a. 2D ECHO: EF 25-30%. Akinesis of the anteroseptal and   Left main coronary artery disease    Myocardial infarction ([place])    Obesity    S/P CABG x 2 09/02/2015   LIMA to LAD, SVG to ramus intermediate branch, EVH via right thigh    Shortness of breath dyspnea    Tobacco abuse    Urinary frequency     Past Surgical History:  Procedure Laterality Date   CARDIAC CATHETERIZATION     CARDIAC CATHETERIZATION N/A 06/14/2015   Procedure: Left Heart Cath and Coronary Angiography;  Surgeon: Lorretta Harp, MD;  Location: [facility];  Service: Cardiovascular;  Laterality: N/A;   CARDIAC CATHETERIZATION N/A 08/18/2015   Procedure: Intravascular Pressure Wire/FFR Study;  Surgeon: Dayani Winbush M Martinique, MD;  Location: [facility];  Service: Cardiovascular;  Laterality: N/A;   COLONOSCOPY     COLONOSCOPY WITH PROPOFOL N/A 03/09/2022   Procedure: COLONOSCOPY WITH PROPOFOL;  Surgeon: Yetta Flock, MD;  Location: [facility];  Service: Gastroenterology;  Laterality: N/A;   CORONARY ANGIOPLASTY  2000   CORONARY ANGIOPLASTY WITH STENT PLACEMENT  2015   CORONARY ARTERY BYPASS GRAFT N/A 09/02/2015   Procedure: CORONARY ARTERY BYPASS GRAFTING (CABG) x 2 (LIMA-LAD, SVG-Intermediate) ENDOSCOPIC GREATER SAPHENOUS VEIN HARVEST RIGHT THIGH;  Surgeon: Rexene Alberts, MD;  Location: [facility];  Service: Open Heart Surgery;  Laterality: N/A;   CORONARY BALLOON ANGIOPLASTY N/A 01/04/2021   Procedure: CORONARY BALLOON  ANGIOPLASTY;  Surgeon: Jettie Booze, MD;  Location: [facility];  Service: Cardiovascular;  Laterality: N/A;   FOOT SURGERY     IMPLANTABLE CARDIOVERTER DEFIBRILLATOR IMPLANT N/A 04/17/2014   Procedure: IMPLANTABLE CARDIOVERTER DEFIBRILLATOR IMPLANT;  Surgeon: Evans Lance, MD;  Location: [facility];  Service: Cardiovascular;  Laterality: N/A;   LEFT HEART CATH AND CORONARY ANGIOGRAPHY N/A 01/04/2021   Procedure: LEFT HEART CATH AND CORONARY ANGIOGRAPHY;  Surgeon: Jettie Booze, MD;  Location: [facility];  Service: Cardiovascular;  Laterality: N/A;   LEFT HEART CATH AND CORS/GRAFTS ANGIOGRAPHY N/A 08/30/2017   Procedure: LEFT HEART CATH AND CORS/GRAFTS ANGIOGRAPHY;  Surgeon: Nelva Bush, MD;  Location: [facility];  Service: Cardiovascular;  Laterality: N/A;   LEFT HEART CATHETERIZATION WITH CORONARY ANGIOGRAM N/A 12/11/2013   Procedure: LEFT HEART CATHETERIZATION WITH CORONARY ANGIOGRAM;  Surgeon: Leighanna Kirn M Martinique, MD;  Location: [facility] C[facility] LAB;  Service: Cardiovascular;  Laterality: N/A;   LEFT HEART CATHETERIZATION  WITH CORONARY ANGIOGRAM N/A 01/05/2015   Procedure: LEFT HEART CATHETERIZATION WITH CORONARY ANGIOGRAM;  Surgeon: Roiza Wiedel M Martinique, MD;  Location: [facility];  Service: Cardiovascular;  Laterality: N/A;   PERCUTANEOUS CORONARY STENT INTERVENTION (PCI-S)  12/11/2013   Procedure: PERCUTANEOUS CORONARY STENT INTERVENTION (PCI-S);  Surgeon: Halla Chopp M Martinique, MD;  Location: [facility] CATH LAB;  Service: Cardiovascular;;  prov LAD and mid LAD   PERCUTANEOUS CORONARY STENT INTERVENTION (PCI-S) N/A 02/03/2015   Procedure: PERCUTANEOUS CORONARY STENT INTERVENTION (PCI-S);  Surgeon: Jettie Booze, MD;  Location: [facility] C[facility] LAB;  Service: Cardiovascular;  Laterality: N/A;   POLYPECTOMY  03/09/2022   Procedure: POLYPECTOMY;  Surgeon: Yetta Flock, MD;  Location: [facility];  Service: Gastroenterology;;   SPERMATOCELECTOMY Right 02/21/2022   Procedure: RIGHT  SPERMATOCELECTOMY;  Surgeon: Irine Seal, MD;  Location: [facility];  Service: Urology;  Laterality: Right;   TEE WITHOUT CARDIOVERSION N/A 09/02/2015   Procedure: TRANSESOPHAGEAL ECHOCARDIOGRAM (TEE);  Surgeon: Rexene Alberts, MD;  Location: [facility];  Service: Open Heart Surgery;  Laterality: N/A;    Current Medications: Outpatient Medications Prior to Visit  Medication Sig Dispense Refill   acetaminophen (TYLENOL) 325 MG tablet Take 2 tablets (650 mg total) by mouth every 4 (four) hours as needed for headache or mild pain.     allopurinol (ZYLOPRIM) 300 MG tablet Take 1 tablet (300 mg total) by mouth 2 (two) times daily. 180 tablet 3   apixaban (ELIQUIS) 5 MG TABS tablet Take 1 tablet (5 mg total) by mouth 2 (two) times daily. 60 tablet 11   APPLE CIDER VINEGAR PO Take 1 capsule by mouth daily.     Ascorbic Acid (VITAMIN C) 1000 MG tablet Take 1,000 mg by mouth daily.     aspirin EC 81 MG tablet Take 81 mg by mouth daily.     atorvastatin (LIPITOR) 80 MG tablet Take 1 tablet (80 mg total) by mouth daily. 90 tablet 3   budesonide-formoterol (SYMBICORT) 160-4.5 MCG/ACT inhaler Inhale 2 puffs into the lungs 2 (two) times daily. 1 each 6   buPROPion (WELLBUTRIN XL) 150 MG 24 hr tablet TAKE 1 TABLET BY MOUTH EVERY DAY 90 tablet 0   Calcium Carbonate-Vit D-Min (CALCIUM 1200 PO) Take 1 capsule by mouth daily.     carvedilol (COREG) 6.25 MG tablet Take 1 tablet (6.25 mg total) by mouth 2 (two) times daily. 180 tablet 3   cholecalciferol (VITAMIN D3) 25 MCG (1000 UNIT) tablet Take 1,000 Units by mouth daily.     colchicine 0.6 MG tablet Take 2 tablet po one time.  Then take 1 tablet after one hour. Then take 1 tablet daily for 5 days. 40 tablet 2   empagliflozin (JARDIANCE) 10 MG TABS tablet Take 1 tablet (10 mg total) by mouth daily. 90 tablet 1   ezetimibe (ZETIA) 10 MG tablet Take 1 tablet (10 mg total) by mouth daily. 90 tablet 3   furosemide (LASIX) 40 MG tablet TAKE 1 TABLET BY MOUTH EVERY DAY 90  tablet 1   isosorbide mononitrate (IMDUR) 30 MG 24 hr tablet Take 1 tablet (30 mg total) by mouth daily. 90 tablet 3   Magnesium Oxide 200 MG TABS Take 1 tablet (200 mg total) by mouth 2 (two) times daily. (Patient taking differently: Take 200 mg by mouth daily.) 60 tablet 0   Multiple Vitamin (MULTIVITAMIN) tablet Take 1 tablet by mouth daily.     nicotine (NICODERM CQ - DOSED IN MG/24 HOURS) 21 mg/24hr patch Place 1  patch (21 mg total) onto the skin daily. 28 patch 2   nicotine polacrilex (COMMIT) 4 MG lozenge Take 1 lozenge (4 mg total) by mouth as needed for smoking cessation. 100 tablet 2   nitroGLYCERIN (NITROSTAT) 0.4 MG SL tablet DISSOLVE 1 TABLET UNDER KTHE TONGUE EVERY 5 MINUTES AS NEEDED FOR CHEST PAIN. MAX 3 DOSES/15 MIN 25 tablet 2   OMEGA-3 FATTY ACIDS PO Take 1 tablet by mouth daily.     sacubitril-valsartan (ENTRESTO) 24-26 MG Take 1 tablet by mouth 2 (two) times daily. 60 tablet 6   sildenafil (VIAGRA) 50 MG tablet TAKE 1 TABLET BY MOUTH DAILY AS NEEDED FOR ERECTILE DYSFUNCTION. 10 tablet 3   spironolactone (ALDACTONE) 25 MG tablet Take 25 mg by mouth daily. Twice weekly only     tamsulosin (FLOMAX) 0.4 MG CAPS capsule Take 0.4 mg by mouth daily.     tiotropium (SPIRIVA) 18 MCG inhalation capsule Place 1 capsule (18 mcg total) into inhaler and inhale daily. 30 capsule 12   traZODone (DESYREL) 50 MG tablet TAKE 0.5-1 TABLETS BY MOUTH AT BEDTIME AS NEEDED FOR SLEEP. 90 tablet 2   TURMERIC PO Take 1 capsule by mouth daily.     No facility-administered medications prior to visit.     Allergies:   Patient has no known allergies.   Social History   Socioeconomic History   Marital status: Divorced    Spouse name: Not on file   Number of children: 2   Years of education: Not on file   Highest education level: Not on file  Occupational History   Occupation: [employer]  Tobacco Use   Smoking status: Every Day    Packs/day: 0.50    Years: 35.00    Total pack  years: 17.50    Types: Cigarettes   Smokeless tobacco: Never  Vaping Use   Vaping Use: Never used  Substance and Sexual Activity   Alcohol use: No    Alcohol/week: 0.0 standard drinks of alcohol   Drug use: No   Sexual activity: Not Currently  Other Topics Concern   Not on file  Social History Narrative   Not on file   Social Determinants of Health   Financial Resource Strain: Not on file  Food Insecurity: Not on file  Transportation Needs: Not on file  Physical Activity: Not on file  Stress: Not on file  Social Connections: Not on file     Family History:  The patient's family history includes CAD in his father; Cancer in his mother.   ROS:   Please see the history of present illness.    ROS All other systems reviewed and are negative.   PHYSICAL EXAM:   VS:  There were no vitals taken for this visit.   GENERAL:  Well appearing WM in NAD HEENT:  PERRL, EOMI, sclera are clear. Oropharynx is clear. NECK:  No jugular venous distention, carotid upstroke brisk and symmetric, no bruits, no thyromegaly or adenopathy LUNGS:  Clear to auscultation bilaterally CHEST:  Unremarkable HEART:  RRR,  PMI not displaced or sustained,S1 and S2 within normal limits, no S3, no S4: no clicks, no rubs, no murmurs ABD:  Soft, nontender. BS +, no masses or bruits. No hepatomegaly, no splenomegaly EXT:  2 + pulses throughout, no edema, no cyanosis no clubbing SKIN:  Warm and dry.  No rashes NEURO:  Alert and oriented x 3. Cranial nerves II through XII intact. PSYCH:  Cognitively intact    Wt Readings from  Last 3 Encounters:  03/29/22 229 lb 6.4 oz (104.1 kg)  03/23/22 231 lb 12.8 oz (105.1 kg)  03/10/22 229 lb 3.2 oz (104 kg)      Studies/Labs Reviewed:   EKG:  EKG is ordered today.  NSR with old anterior infarct. T wave inversion in the lateral leads. I have personally reviewed and interpreted this study.   Recent Labs: 12/14/2021: ALT 17; TSH 2.500 02/21/2022: Hemoglobin 13.5;  Platelets 293 03/29/2022: BUN 16; Creatinine, Ser 0.95; Potassium 4.5; Sodium 140   Lipid Panel    Component Value Date/Time   CHOL 90 (L) 12/14/2021 0919   TRIG 70 12/14/2021 0919   HDL 29 (L) 12/14/2021 0919   CHOLHDL 3.1 12/14/2021 0919   CHOLHDL 4.3 01/04/2021 0236   VLDL 34 01/04/2021 0236   LDLCALC 46 12/14/2021 0919    Additional studies/ records that were reviewed today include:   Pulmonary tests PFT 10/03/2017>> FEV1 2.57 (65%) TLC, 6.93 (91%), DLCO 60% PFT 08/31/15 >> FEV1 3.04 (75%), FEV1% 67, TLC 7.98 (104%), DLCO 61% CT chest 08/31/17 >> severe CAD, TAA 4.1 cm, mod/severe centrilobular emphysema, multiple nodules 2-3 mm  Cath 08/30/2017 FINDINGS: Stable native coronary artery disease with 50% distal LMCA and chronic total occlusion of ostial RCA. Distal RCA fills via left-to-right collaterals. Widely patent LIMA to LAD and SVG to ramus. Multiple abnormal vessels arising from the LIMA graft. Mediastinal mass is a consideration. Normal left ventricular filling pressure. Moderate to severely reduced left ventricular contraction.   RECOMMENDATIONS: Recommend CT chest to evaluate for intrathoracic mass. Continue medical therapy and secondary prevention of CAD.  Cath: 01/04/21   LM lesion is 50% stenosed. Patent LIMA to LAD. Patent SVG to ramus. 2nd Diag lesion is 70% stenosed. Mid RCA to Dist RCA lesion is 100% stenosed. Prox RCA to Mid RCA lesion is 100% stenosed. Ost RCA to Prox RCA lesion is 95% stenosed. Ost Cx lesion is 80% stenosed. This territory is not bypassed, but it is a relatively small vessel. Unable to advance balloon after successfully guiding the wire though the ramus graft and retrograde to the ostial circumflex and across the lesion. The left ventricular ejection fraction is 25-35% by visual estimate. There is moderate to severe left ventricular systolic dysfunction. LV end diastolic pressure is normal. There is no aortic valve stenosis.   Medical  therapy for CAD.  Unsuccessful attempt at PCI of ostial circumflex.  Restart IV heparin 8 hours post sheath pull.  OK to start Sarah Ann tomorrow for atrial fibrillation.  He was loaded with Plavix but will not continue if he is starting a DOAC.  If he is not starting a DOAC, DAPT would be reasonable for his CAD.     Diagnostic Dominance: Right     Echo: 01/04/21   IMPRESSIONS     1. There is no left ventricular thrombus (Definity contrast was used).  There is apical dyskinesis. There is severe inferior wall hypokinesis and  inferoseptal akinesis.. Left ventricular ejection fraction, by estimation,  is 25 to 30%. The left ventricle  has severely decreased function. The left ventricle demonstrates regional  wall motion abnormalities (see scoring diagram/findings for description).  The left ventricular internal cavity size was moderately to severely  dilated. Left ventricular diastolic  parameters are consistent with Grade I diastolic dysfunction (impaired  relaxation).   2. Right ventricular systolic function is normal. The right ventricular  size is normal. There is normal pulmonary artery systolic pressure. The  estimated right ventricular systolic pressure is  23.2 mmHg.   3. Left atrial size was severely dilated.   4. The mitral valve is normal in structure. Trivial mitral valve  regurgitation.   5. The aortic valve is tricuspid. There is mild calcification of the  aortic valve. There is mild thickening of the aortic valve. Aortic valve  regurgitation is not visualized. Mild aortic valve sclerosis is present,  with no evidence of aortic valve  stenosis.   6. Aortic dilatation noted. There is mild dilatation of the aortic root,  measuring 39 mm.   7. The inferior vena cava is normal in size with greater than 50%  respiratory variability, suggesting right atrial pressure of 3 mmHg.    ASSESSMENT:    No diagnosis found.    PLAN:   1. CAD s/p CABG x 2, multiple PCI. Patent LIMA to  the LAD, SVG to ramus CTO of RCA complicated by perforation, tamponade, and pericarditis Unsuccessful PCI of Cx - 01/04/21- I personally reviewed films. The angulation into the LCx is not favorable for PCI and it looks like the LCx is adequately supplied by the SVG to ramus - he has no further chest pain - continue ASA - continue imdur     2. Paroxysmal atrial fibrillation - started on eliquis 5 mg BID- he is having difficulty affording Eliquis. Will fill out patient assistance forms. If still unable to afford may have to switch to Coumadin. This patients CHA2DS2-VASc Score and unadjusted Ischemic Stroke Rate (% per year) is equal to 3.2 % stroke rate/year from a score of 3 (CHF, CAD, HTN)     3.  Chronic systolic and diastolic heart failure - echo with EF 30-35% - recommendations to transition to entresto - PA was submitted prior to discharge but this was denied- unclear why. - will continue lisinopril and lasix, on Coreg - Denied Entresto by insurance.      5. Hypertension - continue coreg, imdur, lisinopril, lasix, and spiro     6. Hyperlipidemia with LDL goal < 70 01/04/2021: Cholesterol 181; HDL 42; LDL Cholesterol 105; Triglycerides 169; VLDL 34 - continue statin - zetia was added at discharge - repeat lipids in 2 weeks. If still not at goal will refer to lipid clinic     7. Smoker - he is interested in cessation - failed Chantix. - will try Wellbutrin 150 mg daily.   8. AAA. 5.1 cm. Followed by [facility]. Also with claudication. FU dopplers.     Medication Adjustments/Labs and Tests Ordered: Current medicines are reviewed at length with the patient today.  Concerns regarding medicines are outlined above.  Medication changes, Labs and Tests ordered today are listed in the Patient Instructions below. There are no Patient Instructions on file for this visit.     Signed, Deisha Stull Martinique, MD  05/28/2022 1:36 PM    [facility] [address],  [place], [place]  [address] Phone: [phone]; Fax: [phone]

## 2022-05-29 ENCOUNTER — Telehealth: Payer: Self-pay | Admitting: Nurse Practitioner

## 2022-05-29 ENCOUNTER — Other Ambulatory Visit: Payer: Self-pay | Admitting: Nurse Practitioner

## 2022-05-29 DIAGNOSIS — M109 Gout, unspecified: Secondary | ICD-10-CM

## 2022-05-29 MED ORDER — COLCHICINE 0.6 MG PO TABS: ORAL_TABLET | ORAL | 2 refills | Status: DC

## 2022-05-29 MED ORDER — PREDNISONE 10 MG (21) PO TBPK: ORAL_TABLET | ORAL | 0 refills | Status: DC

## 2022-05-29 NOTE — Telephone Encounter (Signed)
Patient called back requesting prednisone for gout. Please advise.

## 2022-05-31 ENCOUNTER — Ambulatory Visit: Payer: Medicare Other | Admitting: Cardiology

## 2022-06-14 ENCOUNTER — Ambulatory Visit (HOSPITAL_COMMUNITY)
Admission: RE | Admit: 2022-06-14 | Discharge: 2022-06-14 | Disposition: A | Discharge status: Home / Self Care | Payer: Medicare Other | Source: Ambulatory Visit | Attending: Vascular Surgery | Admitting: Vascular Surgery

## 2022-06-14 ENCOUNTER — Ambulatory Visit (INDEPENDENT_AMBULATORY_CARE_PROVIDER_SITE_OTHER)
Admission: RE | Admit: 2022-06-14 | Discharge: 2022-06-14 | Disposition: A | Discharge status: Home / Self Care | Payer: Medicare Other | Source: Ambulatory Visit | Attending: Vascular Surgery | Admitting: Vascular Surgery

## 2022-06-14 ENCOUNTER — Ambulatory Visit (INDEPENDENT_AMBULATORY_CARE_PROVIDER_SITE_OTHER): Payer: Medicare Other | Admitting: Physician Assistant

## 2022-06-14 VITALS — BP 96/63 | HR 61 | Temp 98.0°F | Resp 20 | Ht 72.0 in | Wt 230.4 lb

## 2022-06-14 DIAGNOSIS — I7143 Infrarenal abdominal aortic aneurysm, without rupture: Secondary | ICD-10-CM

## 2022-06-14 DIAGNOSIS — I7121 Aneurysm of the ascending aorta, without rupture: Secondary | ICD-10-CM | POA: Diagnosis present

## 2022-06-14 DIAGNOSIS — I70212 Atherosclerosis of native arteries of extremities with intermittent claudication, left leg: Secondary | ICD-10-CM | POA: Diagnosis present

## 2022-06-14 DIAGNOSIS — I714 Abdominal aortic aneurysm, without rupture, unspecified: Secondary | ICD-10-CM | POA: Insufficient documentation

## 2022-06-14 NOTE — Progress Notes (Signed)
Office Note     CC:  follow up Requesting Provider:  [name], NP  HPI: Kerry Mason is a 66 y.o. (Dec 09, 1955) male who presents for surveillance of abdominal aortic aneurysm.  6 months ago AAA measured 5.1 cm.  Patient denies any new or changing abdominal or back pain.  He also denies any tissue changes or claudication of bilateral lower extremities.  Past medical history significant for CHF with recent EF of 30%, CAD with history of CABG and subsequent coronary stenting, and atrial fibrillation on Eliquis.  He denies tobacco use.   Past Medical History:  Diagnosis Date   Acute systolic CHF (congestive heart failure) (HCC)    AICD (automatic cardioverter/defibrillator) present    Anxiety    Ascending aortic aneurysm ([place]) 08/31/2017   Back pain 08/31/2017   Basal cell carcinoma    left arm   CAD (coronary artery disease)    a. 2000: s/p stent of RCA 2000 with BMS  b. 2015: STEMI s/p LHC with old occlusion of RCA and DESx2 to LAD  c. 0/9/38: complicated PCI on 3/2 for CTO of mid RCA with coronary perforation and cardiac tamponade requiring emergent pericardiocentesis      Cardiac tamponade    a. 02/03/15 2/2 coronary perforation during CTO procedure. Sealed with graftmaster coated stent.    COPD (chronic obstructive pulmonary disease) ([place]) 08/31/2017   Gout    History of pneumonia    HTN (hypertension)    Hyperlipidemia    Ischemic cardiomyopathy    a. 2D ECHO: EF 25-30%. Akinesis of the anteroseptal and   Left main coronary artery disease    Myocardial infarction ([place])    Obesity    S/P CABG x 2 09/02/2015   LIMA to LAD, SVG to ramus intermediate branch, EVH via right thigh    Shortness of breath dyspnea    Tobacco abuse    Urinary frequency     Past Surgical History:  Procedure Laterality Date   CARDIAC CATHETERIZATION     CARDIAC CATHETERIZATION N/A 06/14/2015   Procedure: Left Heart Cath and Coronary Angiography;  Surgeon: Lorretta Harp, MD;  Location: [facility];  Service: Cardiovascular;  Laterality: N/A;   CARDIAC CATHETERIZATION N/A 08/18/2015   Procedure: Intravascular Pressure Wire/FFR Study;  Surgeon: Peter M Martinique, MD;  Location: [facility];  Service: Cardiovascular;  Laterality: N/A;   COLONOSCOPY     COLONOSCOPY WITH PROPOFOL N/A 03/09/2022   Procedure: COLONOSCOPY WITH PROPOFOL;  Surgeon: Yetta Flock, MD;  Location: [facility];  Service: Gastroenterology;  Laterality: N/A;   CORONARY ANGIOPLASTY  2000   CORONARY ANGIOPLASTY WITH STENT PLACEMENT  2015   CORONARY ARTERY BYPASS GRAFT N/A 09/02/2015   Procedure: CORONARY ARTERY BYPASS GRAFTING (CABG) x 2 (LIMA-LAD, SVG-Intermediate) ENDOSCOPIC GREATER SAPHENOUS VEIN HARVEST RIGHT THIGH;  Surgeon: Rexene Alberts, MD;  Location: [facility];  Service: Open Heart Surgery;  Laterality: N/A;   CORONARY BALLOON ANGIOPLASTY N/A 01/04/2021   Procedure: CORONARY BALLOON ANGIOPLASTY;  Surgeon: Jettie Booze, MD;  Location: [facility];  Service: Cardiovascular;  Laterality: N/A;   FOOT SURGERY     IMPLANTABLE CARDIOVERTER DEFIBRILLATOR IMPLANT N/A 04/17/2014   Procedure: IMPLANTABLE CARDIOVERTER DEFIBRILLATOR IMPLANT;  Surgeon: Evans Lance, MD;  Location: [facility] C[facility] LAB;  Service: Cardiovascular;  Laterality: N/A;   LEFT HEART CATH AND CORONARY ANGIOGRAPHY N/A 01/04/2021   Procedure: LEFT HEART CATH AND CORONARY ANGIOGRAPHY;  Surgeon: Jettie Booze, MD;  Location:  [facility];  Service: Cardiovascular;  Laterality: N/A;   LEFT HEART CATH AND CORS/GRAFTS ANGIOGRAPHY N/A 08/30/2017   Procedure: LEFT HEART CATH AND CORS/GRAFTS ANGIOGRAPHY;  Surgeon: Nelva Bush, MD;  Location: [facility];  Service: Cardiovascular;  Laterality: N/A;   LEFT HEART CATHETERIZATION WITH CORONARY ANGIOGRAM N/A 12/11/2013   Procedure: LEFT HEART CATHETERIZATION WITH CORONARY ANGIOGRAM;  Surgeon: Peter M Martinique, MD;  Location: [facility] C[facility] LAB;  Service: Cardiovascular;   Laterality: N/A;   LEFT HEART CATHETERIZATION WITH CORONARY ANGIOGRAM N/A 01/05/2015   Procedure: LEFT HEART CATHETERIZATION WITH CORONARY ANGIOGRAM;  Surgeon: Peter M Martinique, MD;  Location: [facility] CATH LAB;  Service: Cardiovascular;  Laterality: N/A;   PERCUTANEOUS CORONARY STENT INTERVENTION (PCI-S)  12/11/2013   Procedure: PERCUTANEOUS CORONARY STENT INTERVENTION (PCI-S);  Surgeon: Peter M Martinique, MD;  Location: [facility] C[facility] LAB;  Service: Cardiovascular;;  prov LAD and mid LAD   PERCUTANEOUS CORONARY STENT INTERVENTION (PCI-S) N/A 02/03/2015   Procedure: PERCUTANEOUS CORONARY STENT INTERVENTION (PCI-S);  Surgeon: Jettie Booze, MD;  Location: [facility] CATH LAB;  Service: Cardiovascular;  Laterality: N/A;   POLYPECTOMY  03/09/2022   Procedure: POLYPECTOMY;  Surgeon: Yetta Flock, MD;  Location: [facility];  Service: Gastroenterology;;   SPERMATOCELECTOMY Right 02/21/2022   Procedure: RIGHT SPERMATOCELECTOMY;  Surgeon: Irine Seal, MD;  Location: [facility];  Service: Urology;  Laterality: Right;   TEE WITHOUT CARDIOVERSION N/A 09/02/2015   Procedure: TRANSESOPHAGEAL ECHOCARDIOGRAM (TEE);  Surgeon: Rexene Alberts, MD;  Location: [facility];  Service: Open Heart Surgery;  Laterality: N/A;    Social History   Socioeconomic History   Marital status: Divorced    Spouse name: Not on file   Number of children: 2   Years of education: Not on file   Highest education level: Not on file  Occupational History   Occupation: [employer]  Tobacco Use   Smoking status: Every Day    Packs/day: 0.50    Years: 35.00    Total pack years: 17.50    Types: Cigarettes    Passive exposure: Never   Smokeless tobacco: Never  Vaping Use   Vaping Use: Never used  Substance and Sexual Activity   Alcohol use: No    Alcohol/week: 0.0 standard drinks of alcohol   Drug use: No   Sexual activity: Not Currently  Other Topics Concern   Not on file  Social History Narrative   Not on file   Social Determinants  of Health   Financial Resource Strain: Not on file  Food Insecurity: Not on file  Transportation Needs: Not on file  Physical Activity: Not on file  Stress: Not on file  Social Connections: Not on file  Intimate Partner Violence: Not on file    Family History  Problem Relation Age of Onset   CAD Father        PTCA   Cancer Mother        LYMPHOMA    Current Outpatient Medications  Medication Sig Dispense Refill   acetaminophen (TYLENOL) 325 MG tablet Take 2 tablets (650 mg total) by mouth every 4 (four) hours as needed for headache or mild pain.     allopurinol (ZYLOPRIM) 300 MG tablet Take 1 tablet (300 mg total) by mouth 2 (two) times daily. 180 tablet 3   apixaban (ELIQUIS) 5 MG TABS tablet Take 1 tablet (5 mg total) by mouth 2 (two) times daily. 60 tablet 11   APPLE CIDER VINEGAR PO Take 1 capsule by  mouth daily.     Ascorbic Acid (VITAMIN C) 1000 MG tablet Take 1,000 mg by mouth daily.     aspirin EC 81 MG tablet Take 81 mg by mouth daily.     budesonide-formoterol (SYMBICORT) 160-4.5 MCG/ACT inhaler Inhale 2 puffs into the lungs 2 (two) times daily. 1 each 6   buPROPion (WELLBUTRIN XL) 150 MG 24 hr tablet TAKE 1 TABLET BY MOUTH EVERY DAY 90 tablet 0   Calcium Carbonate-Vit D-Min (CALCIUM 1200 PO) Take 1 capsule by mouth daily.     carvedilol (COREG) 6.25 MG tablet Take 1 tablet (6.25 mg total) by mouth 2 (two) times daily. 180 tablet 3   cholecalciferol (VITAMIN D3) 25 MCG (1000 UNIT) tablet Take 1,000 Units by mouth daily.     colchicine 0.6 MG tablet Take 2 tablet po one time.  Then take 1 tablet after one hour. Then take 1 tablet daily for 5 days. 40 tablet 2   empagliflozin (JARDIANCE) 10 MG TABS tablet Take 1 tablet (10 mg total) by mouth daily. 90 tablet 1   ezetimibe (ZETIA) 10 MG tablet Take 1 tablet (10 mg total) by mouth daily. 90 tablet 3   furosemide (LASIX) 40 MG tablet TAKE 1 TABLET BY MOUTH EVERY DAY 90 tablet 1   isosorbide mononitrate (IMDUR) 30 MG 24 hr  tablet Take 1 tablet (30 mg total) by mouth daily. 90 tablet 3   Magnesium Oxide 200 MG TABS Take 1 tablet (200 mg total) by mouth 2 (two) times daily. (Patient taking differently: Take 200 mg by mouth daily.) 60 tablet 0   Multiple Vitamin (MULTIVITAMIN) tablet Take 1 tablet by mouth daily.     nicotine (NICODERM CQ - DOSED IN MG/24 HOURS) 21 mg/24hr patch Place 1 patch (21 mg total) onto the skin daily. 28 patch 2   nicotine polacrilex (COMMIT) 4 MG lozenge Take 1 lozenge (4 mg total) by mouth as needed for smoking cessation. 100 tablet 2   nitroGLYCERIN (NITROSTAT) 0.4 MG SL tablet DISSOLVE 1 TABLET UNDER KTHE TONGUE EVERY 5 MINUTES AS NEEDED FOR CHEST PAIN. MAX 3 DOSES/15 MIN 25 tablet 2   OMEGA-3 FATTY ACIDS PO Take 1 tablet by mouth daily.     predniSONE (STERAPRED UNI-PAK 21 TAB) 10 MG (21) TBPK tablet 6 day taper - take by mouth as directed for 6 days 21 tablet 0   sacubitril-valsartan (ENTRESTO) 24-26 MG Take 1 tablet by mouth 2 (two) times daily. 60 tablet 6   sildenafil (VIAGRA) 50 MG tablet TAKE 1 TABLET BY MOUTH DAILY AS NEEDED FOR ERECTILE DYSFUNCTION. 10 tablet 3   spironolactone (ALDACTONE) 25 MG tablet Take 25 mg by mouth daily. Twice weekly only     tamsulosin (FLOMAX) 0.4 MG CAPS capsule Take 0.4 mg by mouth daily.     tiotropium (SPIRIVA) 18 MCG inhalation capsule Place 1 capsule (18 mcg total) into inhaler and inhale daily. 30 capsule 12   traZODone (DESYREL) 50 MG tablet TAKE 0.5-1 TABLETS BY MOUTH AT BEDTIME AS NEEDED FOR SLEEP. 90 tablet 2   TURMERIC PO Take 1 capsule by mouth daily.     atorvastatin (LIPITOR) 80 MG tablet Take 1 tablet (80 mg total) by mouth daily. 90 tablet 3   No current facility-administered medications for this visit.    No Known Allergies   REVIEW OF SYSTEMS:   '[X]'$  denotes positive finding, '[ ]'$  denotes negative finding Cardiac  Comments:  Chest pain or chest pressure:    Shortness of breath  upon exertion:    Short of breath when lying flat:     Irregular heart rhythm:        Vascular    Pain in calf, thigh, or hip brought on by ambulation:    Pain in feet at night that wakes you up from your sleep:     Blood clot in your veins:    Leg swelling:         Pulmonary    Oxygen at home:    Productive cough:     Wheezing:         Neurologic    Sudden weakness in arms or legs:     Sudden numbness in arms or legs:     Sudden onset of difficulty speaking or slurred speech:    Temporary loss of vision in one eye:     Problems with dizziness:         Gastrointestinal    Blood in stool:     Vomited blood:         Genitourinary    Burning when urinating:     Blood in urine:        Psychiatric    Major depression:         Hematologic    Bleeding problems:    Problems with blood clotting too easily:        Skin    Rashes or ulcers:        Constitutional    Fever or chills:      PHYSICAL EXAMINATION:  Vitals:   06/14/22 0914  BP: 96/63  Pulse: 61  Resp: 20  Temp: 98 F (36.7 C)  TempSrc: Temporal  SpO2: 95%  Weight: 230 lb 6.4 oz (104.5 kg)  Height: 6' (1.829 m)    General:  WDWN in NAD; vital signs documented above Gait: Not observed HENT: WNL, normocephalic Pulmonary: normal non-labored breathing , without Rales, rhonchi,  wheezing Cardiac: regular HR Abdomen: soft, NT, no masses Skin: without rashes Vascular Exam/Pulses:  Right Left  PT 2+ (normal) 2+ (normal)   Extremities: without ischemic changes, without Gangrene , without cellulitis; without open wounds;  Musculoskeletal: no muscle wasting or atrophy  Neurologic: A&O X 3;  No focal weakness or paresthesias are detected Psychiatric:  The pt has Normal affect.   Non-Invasive Vascular Imaging:   5.36 cm AAA at the largest diameter  ABI/TBIToday's ABIToday's TBIPrevious ABIPrevious TBI  +-------+-----------+-----------+------------+------------+  Right  1.17       1.05       1.09                       +-------+-----------+-----------+------------+------------+  Left   1.10       0.88       1.21                      +-------+-----------+-----------+------------+------------+    ASSESSMENT/PLAN:: 66 y.o. male returns to clinic for surveillance of AAA and to recheck ABIs  -The patient is not experiencing any ischemic symptoms of bilateral lower extremities and is without claudication; ABIs are within normal limits.  These will not be repeated in the future -AAA has slightly increased in size from 5.1 to 5.4 cm by duplex -Plan will be to check CTA abdomen and pelvis to further evaluate AAA and for surgical planning.  Patient will follow-up with Dr. Donzetta Matters with results in the next few weeks   Dagoberto Ligas, PA-C [facility] [phone]  Clinic MD:   Donzetta Matters

## 2022-06-28 ENCOUNTER — Other Ambulatory Visit: Payer: Self-pay

## 2022-06-28 DIAGNOSIS — I7143 Infrarenal abdominal aortic aneurysm, without rupture: Secondary | ICD-10-CM

## 2022-07-05 ENCOUNTER — Ambulatory Visit (HOSPITAL_COMMUNITY)
Admission: RE | Admit: 2022-07-05 | Discharge: 2022-07-05 | Disposition: A | Discharge status: Home / Self Care | Payer: Medicare Other | Source: Ambulatory Visit | Attending: Vascular Surgery | Admitting: Vascular Surgery

## 2022-07-05 DIAGNOSIS — I7143 Infrarenal abdominal aortic aneurysm, without rupture: Secondary | ICD-10-CM | POA: Insufficient documentation

## 2022-07-05 MED ORDER — IOHEXOL 350 MG/ML SOLN
100.0000 mL | Freq: Once | INTRAVENOUS | Status: AC | PRN
  Administered 2022-07-05: 100 mL via INTRAVENOUS

## 2022-07-12 ENCOUNTER — Ambulatory Visit (INDEPENDENT_AMBULATORY_CARE_PROVIDER_SITE_OTHER): Payer: Medicare Other | Admitting: Vascular Surgery

## 2022-07-12 ENCOUNTER — Encounter: Payer: Self-pay | Admitting: Vascular Surgery

## 2022-07-12 ENCOUNTER — Other Ambulatory Visit: Payer: Self-pay

## 2022-07-12 VITALS — BP 108/70 | HR 68 | Temp 98.3°F | Resp 20 | Ht 72.0 in | Wt 230.0 lb

## 2022-07-12 DIAGNOSIS — I7143 Infrarenal abdominal aortic aneurysm, without rupture: Secondary | ICD-10-CM

## 2022-07-12 DIAGNOSIS — I714 Abdominal aortic aneurysm, without rupture, unspecified: Secondary | ICD-10-CM

## 2022-07-12 DIAGNOSIS — Z419 Encounter for procedure for purposes other than remedying health state, unspecified: Secondary | ICD-10-CM

## 2022-07-12 NOTE — Progress Notes (Signed)
Patient ID: Kerry Mason, male   DOB: July 20, 1956, 66 y.o.   MRN: [id number]  Reason for Consult: Follow-up   Referred by [name], NP  Subjective:     HPI:  Kerry Mason is a 66 y.o. male history of abdominal aortic aneurysm recently underwent ultrasound which demonstrated increased growth.  He now follows up with CT scan.  No new back or abdominal pain.  Does have extensive history of coronary artery disease and a previous cardiac tamponade following attempted intervention several years ago.  He walks with out limitations does have some shortness of breath but is not followed by pulmonology.  Patient lives alone, he does take Eliquis as part of his regimen for heart failure.  Past Medical History:  Diagnosis Date   Acute systolic CHF (congestive heart failure) (HCC)    AICD (automatic cardioverter/defibrillator) present    Anxiety    Ascending aortic aneurysm ([place]) 08/31/2017   Back pain 08/31/2017   Basal cell carcinoma    left arm   CAD (coronary artery disease)    a. 2000: s/p stent of RCA 2000 with BMS  b. 2015: STEMI s/p LHC with old occlusion of RCA and DESx2 to LAD  c. 07/11/90: complicated PCI on 3/2 for CTO of mid RCA with coronary perforation and cardiac tamponade requiring emergent pericardiocentesis      Cardiac tamponade    a. 02/03/15 2/2 coronary perforation during CTO procedure. Sealed with graftmaster coated stent.    COPD (chronic obstructive pulmonary disease) ([place]) 08/31/2017   Gout    History of pneumonia    HTN (hypertension)    Hyperlipidemia    Ischemic cardiomyopathy    a. 2D ECHO: EF 25-30%. Akinesis of the anteroseptal and   Left main coronary artery disease    Myocardial infarction ([place])    Obesity    S/P CABG x 2 09/02/2015   LIMA to LAD, SVG to ramus intermediate branch, EVH via right thigh    Shortness of breath dyspnea    Tobacco abuse    Urinary frequency    Family History  Problem Relation Age of Onset   CAD Father        PTCA    Cancer Mother        LYMPHOMA   Past Surgical History:  Procedure Laterality Date   CARDIAC CATHETERIZATION     CARDIAC CATHETERIZATION N/A 06/14/2015   Procedure: Left Heart Cath and Coronary Angiography;  Surgeon: Lorretta Harp, MD;  Location: [place] [facility];  Service: Cardiovascular;  Laterality: N/A;   CARDIAC CATHETERIZATION N/A 08/18/2015   Procedure: Intravascular Pressure Wire/FFR Study;  Surgeon: Peter M Martinique, MD;  Location: [place] [facility];  Service: Cardiovascular;  Laterality: N/A;   COLONOSCOPY     COLONOSCOPY WITH PROPOFOL N/A 03/09/2022   Procedure: COLONOSCOPY WITH PROPOFOL;  Surgeon: Yetta Flock, MD;  Location: [facility];  Service: Gastroenterology;  Laterality: N/A;   CORONARY ANGIOPLASTY  2000   CORONARY ANGIOPLASTY WITH STENT PLACEMENT  2015   CORONARY ARTERY BYPASS GRAFT N/A 09/02/2015   Procedure: CORONARY ARTERY BYPASS GRAFTING (CABG) x 2 (LIMA-LAD, SVG-Intermediate) ENDOSCOPIC GREATER SAPHENOUS VEIN HARVEST RIGHT THIGH;  Surgeon: Rexene Alberts, MD;  Location: [place];  Service: Open Heart Surgery;  Laterality: N/A;   CORONARY BALLOON ANGIOPLASTY N/A 01/04/2021   Procedure: CORONARY BALLOON ANGIOPLASTY;  Surgeon: Jettie Booze, MD;  Location: [place] [facility];  Service: Cardiovascular;  Laterality: N/A;   FOOT SURGERY  IMPLANTABLE CARDIOVERTER DEFIBRILLATOR IMPLANT N/A 04/17/2014   Procedure: IMPLANTABLE CARDIOVERTER DEFIBRILLATOR IMPLANT;  Surgeon: Evans Lance, MD;  Location: [facility] LAB;  Service: Cardiovascular;  Laterality: N/A;   LEFT HEART CATH AND CORONARY ANGIOGRAPHY N/A 01/04/2021   Procedure: LEFT HEART CATH AND CORONARY ANGIOGRAPHY;  Surgeon: Jettie Booze, MD;  Location: [place] [facility];  Service: Cardiovascular;  Laterality: N/A;   LEFT HEART CATH AND CORS/GRAFTS ANGIOGRAPHY N/A 08/30/2017   Procedure: LEFT HEART CATH AND CORS/GRAFTS ANGIOGRAPHY;  Surgeon: Nelva Bush, MD;  Location: [place] [facility];   Service: Cardiovascular;  Laterality: N/A;   LEFT HEART CATHETERIZATION WITH CORONARY ANGIOGRAM N/A 12/11/2013   Procedure: LEFT HEART CATHETERIZATION WITH CORONARY ANGIOGRAM;  Surgeon: Peter M Martinique, MD;  Location: [facility] CATH LAB;  Service: Cardiovascular;  Laterality: N/A;   LEFT HEART CATHETERIZATION WITH CORONARY ANGIOGRAM N/A 01/05/2015   Procedure: LEFT HEART CATHETERIZATION WITH CORONARY ANGIOGRAM;  Surgeon: Peter M Martinique, MD;  Location: [facility] CATH LAB;  Service: Cardiovascular;  Laterality: N/A;   PERCUTANEOUS CORONARY STENT INTERVENTION (PCI-S)  12/11/2013   Procedure: PERCUTANEOUS CORONARY STENT INTERVENTION (PCI-S);  Surgeon: Peter M Martinique, MD;  Location: [facility] CATH LAB;  Service: Cardiovascular;;  prov LAD and mid LAD   PERCUTANEOUS CORONARY STENT INTERVENTION (PCI-S) N/A 02/03/2015   Procedure: PERCUTANEOUS CORONARY STENT INTERVENTION (PCI-S);  Surgeon: Jettie Booze, MD;  Location: [facility] CATH LAB;  Service: Cardiovascular;  Laterality: N/A;   POLYPECTOMY  03/09/2022   Procedure: POLYPECTOMY;  Surgeon: Yetta Flock, MD;  Location: [facility];  Service: Gastroenterology;;   SPERMATOCELECTOMY Right 02/21/2022   Procedure: RIGHT SPERMATOCELECTOMY;  Surgeon: Irine Seal, MD;  Location: [facility];  Service: Urology;  Laterality: Right;   TEE WITHOUT CARDIOVERSION N/A 09/02/2015   Procedure: TRANSESOPHAGEAL ECHOCARDIOGRAM (TEE);  Surgeon: Rexene Alberts, MD;  Location: [place];  Service: Open Heart Surgery;  Laterality: N/A;    Short Social History:  Social History   Tobacco Use   Smoking status: Every Day    Packs/day: 0.50    Years: 35.00    Total pack years: 17.50    Types: Cigarettes    Passive exposure: Never   Smokeless tobacco: Never  Substance Use Topics   Alcohol use: No    Alcohol/week: 0.0 standard drinks of alcohol    No Known Allergies  Current Outpatient Medications  Medication Sig Dispense Refill   acetaminophen (TYLENOL) 325 MG tablet Take 2 tablets (650 mg total)  by mouth every 4 (four) hours as needed for headache or mild pain.     allopurinol (ZYLOPRIM) 300 MG tablet Take 1 tablet (300 mg total) by mouth 2 (two) times daily. 180 tablet 3   apixaban (ELIQUIS) 5 MG TABS tablet Take 1 tablet (5 mg total) by mouth 2 (two) times daily. 60 tablet 11   APPLE CIDER VINEGAR PO Take 1 capsule by mouth daily.     Ascorbic Acid (VITAMIN C) 1000 MG tablet Take 1,000 mg by mouth daily.     aspirin EC 81 MG tablet Take 81 mg by mouth daily.     budesonide-formoterol (SYMBICORT) 160-4.5 MCG/ACT inhaler Inhale 2 puffs into the lungs 2 (two) times daily. 1 each 6   buPROPion (WELLBUTRIN XL) 150 MG 24 hr tablet TAKE 1 TABLET BY MOUTH EVERY DAY 90 tablet 0   Calcium Carbonate-Vit D-Min (CALCIUM 1200 PO) Take 1 capsule by mouth daily.     carvedilol (COREG) 6.25 MG tablet Take 1 tablet (6.25 mg total) by  mouth 2 (two) times daily. 180 tablet 3   cholecalciferol (VITAMIN D3) 25 MCG (1000 UNIT) tablet Take 1,000 Units by mouth daily.     colchicine 0.6 MG tablet Take 2 tablet po one time.  Then take 1 tablet after one hour. Then take 1 tablet daily for 5 days. 40 tablet 2   empagliflozin (JARDIANCE) 10 MG TABS tablet Take 1 tablet (10 mg total) by mouth daily. 90 tablet 1   ezetimibe (ZETIA) 10 MG tablet Take 1 tablet (10 mg total) by mouth daily. 90 tablet 3   furosemide (LASIX) 40 MG tablet TAKE 1 TABLET BY MOUTH EVERY DAY 90 tablet 1   isosorbide mononitrate (IMDUR) 30 MG 24 hr tablet Take 1 tablet (30 mg total) by mouth daily. 90 tablet 3   Magnesium Oxide 200 MG TABS Take 1 tablet (200 mg total) by mouth 2 (two) times daily. (Patient taking differently: Take 200 mg by mouth daily.) 60 tablet 0   Multiple Vitamin (MULTIVITAMIN) tablet Take 1 tablet by mouth daily.     nicotine (NICODERM CQ - DOSED IN MG/24 HOURS) 21 mg/24hr patch Place 1 patch (21 mg total) onto the skin daily. 28 patch 2   nicotine polacrilex (COMMIT) 4 MG lozenge Take 1 lozenge (4 mg total) by mouth as  needed for smoking cessation. 100 tablet 2   nitroGLYCERIN (NITROSTAT) 0.4 MG SL tablet DISSOLVE 1 TABLET UNDER KTHE TONGUE EVERY 5 MINUTES AS NEEDED FOR CHEST PAIN. MAX 3 DOSES/15 MIN 25 tablet 2   OMEGA-3 FATTY ACIDS PO Take 1 tablet by mouth daily.     sacubitril-valsartan (ENTRESTO) 24-26 MG Take 1 tablet by mouth 2 (two) times daily. 60 tablet 6   sildenafil (VIAGRA) 50 MG tablet TAKE 1 TABLET BY MOUTH DAILY AS NEEDED FOR ERECTILE DYSFUNCTION. 10 tablet 3   spironolactone (ALDACTONE) 25 MG tablet Take 25 mg by mouth daily. Twice weekly only     tamsulosin (FLOMAX) 0.4 MG CAPS capsule Take 0.4 mg by mouth daily.     tiotropium (SPIRIVA) 18 MCG inhalation capsule Place 1 capsule (18 mcg total) into inhaler and inhale daily. 30 capsule 12   traZODone (DESYREL) 50 MG tablet TAKE 0.5-1 TABLETS BY MOUTH AT BEDTIME AS NEEDED FOR SLEEP. 90 tablet 2   TURMERIC PO Take 1 capsule by mouth daily.     atorvastatin (LIPITOR) 80 MG tablet Take 1 tablet (80 mg total) by mouth daily. 90 tablet 3   predniSONE (STERAPRED UNI-PAK 21 TAB) 10 MG (21) TBPK tablet 6 day taper - take by mouth as directed for 6 days (Patient not taking: Reported on 07/12/2022) 21 tablet 0   No current facility-administered medications for this visit.    Review of Systems  Constitutional:  Constitutional negative. HENT: HENT negative.  Eyes: Eyes negative.  Respiratory: Respiratory negative.  Cardiovascular: Cardiovascular negative.  GI: Gastrointestinal negative.  Musculoskeletal: Musculoskeletal negative.  Skin: Skin negative.  Neurological: Neurological negative. Hematologic: Hematologic/lymphatic negative.  Psychiatric: Psychiatric negative.        Objective:  Objective   Vitals:   07/12/22 1523  BP: 108/70  Pulse: 68  Resp: 20  Temp: 98.3 F (36.8 C)  SpO2: 93%  Weight: 230 lb (104.3 kg)  Height: 6' (1.829 m)   Body mass index is 31.19 kg/m.  Physical Exam HENT:     Head: Normocephalic.     Nose: Nose  normal.  Eyes:     Pupils: Pupils are equal, round, and reactive to light.  Cardiovascular:     Rate and Rhythm: Normal rate.  Pulmonary:     Effort: Pulmonary effort is normal.  Abdominal:     General: Abdomen is flat.     Palpations: Abdomen is soft.  Musculoskeletal:     Cervical back: Normal range of motion and neck supple.     Right lower leg: No edema.     Left lower leg: No edema.  Skin:    General: Skin is warm and dry.     Capillary Refill: Capillary refill takes less than 2 seconds.  Neurological:     General: No focal deficit present.     Mental Status: He is alert.  Psychiatric:        Mood and Affect: Mood normal.        Behavior: Behavior normal.        Thought Content: Thought content normal.     Data: CT IMPRESSION: VASCULAR   1. Enlargement of the previously visualized fusiform infrarenal abdominal aortic aneurysm from 4.9 cm (06/21/2020) to 5.4 cm. Recommend referral to a vascular specialist if not already obtained. This recommendation follows ACR consensus guidelines: White Paper of the ACR Incidental Findings Committee II on Vascular Findings. J Am Coll Radiol 2013; 10:789-794. 2.  Aortic Atherosclerosis (ICD10-I70.0).     Assessment/Plan:    66 year old male now with greater than 5.5 cm aneurysm by our review in the office today in the presence of his daughter.  We discussed his options being continued watchful waiting with the risk of over 5 %/year of rupture versus endovascular versus open aneurysm repair.  We discussed his best option being endovascular repair.  He will need cardiac clearance with Dr. Araceli Bouche and we will proceed pending those results.  We discussed the risk and benefits of the procedure including need for common femoral endarterectomy given his extensive disease which would likely extend his hospital stay in a couple of days more than likely would stay 1 or 2 nights in the hospital following surgery.  All of his questions were answered  in the presence of his daughter.     Waynetta Sandy MD [facility]

## 2022-07-13 ENCOUNTER — Ambulatory Visit (INDEPENDENT_AMBULATORY_CARE_PROVIDER_SITE_OTHER): Payer: Medicare Other

## 2022-07-13 DIAGNOSIS — I255 Ischemic cardiomyopathy: Secondary | ICD-10-CM

## 2022-07-13 LAB — CUP PACEART REMOTE DEVICE CHECK
Battery Remaining Percentage: 29 %
Battery Voltage: 3.02 V
Brady Statistic RV Percent Paced: 0 %
Date Time Interrogation Session: 20230810055950
HighPow Impedance: 88 Ohm
Implantable Lead Implant Date: 20150515
Implantable Lead Location: 753860
Implantable Lead Model: 365
Implantable Lead Serial Number: 10579264
Implantable Pulse Generator Implant Date: 20150515
Lead Channel Impedance Value: 608 Ohm
Lead Channel Sensing Intrinsic Amplitude: 20 mV
Lead Channel Sensing Intrinsic Amplitude: 5.3 mV
Lead Channel Setting Pacing Amplitude: 2.5 V
Lead Channel Setting Pacing Pulse Width: 0.4 ms
Lead Channel Setting Sensing Sensitivity: 0.8 mV
Pulse Gen Model: 383594
Pulse Gen Serial Number: 60800912

## 2022-07-18 ENCOUNTER — Other Ambulatory Visit: Payer: Self-pay | Admitting: Nurse Practitioner

## 2022-07-18 DIAGNOSIS — J449 Chronic obstructive pulmonary disease, unspecified: Secondary | ICD-10-CM

## 2022-07-23 ENCOUNTER — Other Ambulatory Visit: Payer: Self-pay | Admitting: Nurse Practitioner

## 2022-07-23 DIAGNOSIS — M109 Gout, unspecified: Secondary | ICD-10-CM

## 2022-07-25 ENCOUNTER — Other Ambulatory Visit: Payer: Self-pay | Admitting: Cardiology

## 2022-08-09 NOTE — Progress Notes (Signed)
Remote ICD transmission.   

## 2022-08-09 NOTE — Pre-Procedure Instructions (Signed)
Surgical Instructions    Your procedure is scheduled on Friday September 15.  Report to [facility] Main Entrance "A" at 5:30 A.M., then check in with the Admitting office.  Call this number if you have problems the morning of surgery:  [phone]   If you have any questions prior to your surgery date call [phone]: Open Monday-Friday 8am-4pm    Remember:  Do not eat or drink after midnight the night before your surgery    Take these medicines the morning of surgery with A SIP OF WATER:  allopurinol (ZYLOPRIM) atorvastatin (LIPITOR) buPROPion (WELLBUTRIN XL) carvedilol (COREG) colchicine ezetimibe (ZETIA) isosorbide mononitrate (IMDUR) SYMBICORT 160-4.5 MCG/ACT inhaler tamsulosin (FLOMAX) tiotropium (SPIRIVA)  Please bring all inhalers with you the day of surgery.   IF NEEDED: acetaminophen (TYLENOL)  nitroGLYCERIN (NITROSTAT)- call if you take this  WHAT DO I DO ABOUT MY DIABETES MEDICATION?  Do not take empagliflozin (JARDIANCE)  the morning of surgery.  HOW TO MANAGE YOUR DIABETES BEFORE AND AFTER SURGERY  Why is it important to control my blood sugar before and after surgery? Improving blood sugar levels before and after surgery helps healing and can limit problems. A way of improving blood sugar control is eating a healthy diet by:  Eating less sugar and carbohydrates  Increasing activity/exercise  Talking with your doctor about reaching your blood sugar goals High blood sugars (greater than 180 mg/dL) can raise your risk of infections and slow your recovery, so you will need to focus on controlling your diabetes during the weeks before surgery. Make sure that the doctor who takes care of your diabetes knows about your planned surgery including the date and location.  How do I manage my blood sugar before surgery? Check your blood sugar at least 4 times a day, starting 2 days before surgery, to make sure that the level is not too high or low.  Check your  blood sugar the morning of your surgery when you wake up and every 2 hours until you get to the Short Stay unit.  If your blood sugar is less than 70 mg/dL, you will need to treat for low blood sugar: Do not take insulin. Treat a low blood sugar (less than 70 mg/dL) with  cup of clear juice (cranberry or apple), 4 glucose tablets, OR glucose gel. Recheck blood sugar in 15 minutes after treatment (to make sure it is greater than 70 mg/dL). If your blood sugar is not greater than 70 mg/dL on recheck, call [phone] for further instructions. Report your blood sugar to the short stay nurse when you get to Short Stay.  If you are admitted to the hospital after surgery: Your blood sugar will be checked by the staff and you will probably be given insulin after surgery (instead of oral diabetes medicines) to make sure you have good blood sugar levels. The goal for blood sugar control after surgery is 80-180 mg/dL.   Follow your surgeon's instructions on when to stop apixaban (ELIQUIS).  If no instructions were given by your surgeon then you will need to call the office to get those instructions.     As of today, STOP taking any Aspirin (unless otherwise instructed by your surgeon) Aleve, Naproxen, Ibuprofen, Motrin, Advil, Goody's, BC's, all herbal medications, fish oil, and all vitamins.           Do not wear jewelry or makeup. Do not wear lotions, powders, cologne or deodorant. Men may shave face and neck. Do not bring valuables to  the hospital. [facility] is not responsible for any belongings or valuables.    Do NOT Smoke (Tobacco/Vaping)  24 hours prior to your procedure  If you use a CPAP at night, you may bring your mask for your overnight stay.   Contacts, glasses, hearing aids, dentures or partials may not be worn into surgery, please bring cases for these belongings   For patients admitted to the hospital, discharge time will be determined by your treatment team.   Patients  discharged the day of surgery will not be allowed to drive home, and someone needs to stay with them for 24 hours.  SURGICAL WAITING ROOM VISITATION Patients having surgery or a procedure may have no more than 2 support people in the waiting area - these visitors may rotate.   Children under the age of 4 must have an adult with them who is not the patient. If the patient needs to stay at the hospital during part of their recovery, the visitor guidelines for inpatient rooms apply. Pre-op nurse will coordinate an appropriate time for 1 support person to accompany patient in pre-op.  This support person may not rotate.   Please refer to the [facility] website for the visitor guidelines for Inpatients (after your surgery is over and you are in a regular room).    Special instructions:    Oral Hygiene is also important to reduce your risk of infection.  Remember - BRUSH YOUR TEETH THE MORNING OF SURGERY WITH YOUR REGULAR TOOTHPASTE   - Preparing For Surgery  Before surgery, you can play an important role. Because skin is not sterile, your skin needs to be as free of germs as possible. You can reduce the number of germs on your skin by washing with CHG (chlorahexidine gluconate) Soap before surgery.  CHG is an antiseptic cleaner which kills germs and bonds with the skin to continue killing germs even after washing.     Please do not use if you have an allergy to CHG or antibacterial soaps. If your skin becomes reddened/irritated stop using the CHG.  Do not shave (including legs and underarms) for at least 48 hours prior to first CHG shower. It is OK to shave your face.  Please follow these instructions carefully.     Shower the NIGHT BEFORE SURGERY and the MORNING OF SURGERY with CHG Soap.   If you chose to wash your hair, wash your hair first as usual with your normal shampoo. After you shampoo, rinse your hair and body thoroughly to remove the shampoo.  Then [employer] and genitals  (private parts) with your normal soap and rinse thoroughly to remove soap.  After that Use CHG Soap as you would any other liquid soap. You can apply CHG directly to the skin and wash gently with a scrungie or a clean washcloth.   Apply the CHG Soap to your body ONLY FROM THE NECK DOWN.  Do not use on open wounds or open sores. Avoid contact with your eyes, ears, mouth and genitals (private parts). Wash Face and genitals (private parts)  with your normal soap.   Wash thoroughly, paying special attention to the area where your surgery will be performed.  Thoroughly rinse your body with warm water from the neck down.  DO NOT shower/wash with your normal soap after using and rinsing off the CHG Soap.  Pat yourself dry with a CLEAN TOWEL.  Wear CLEAN PAJAMAS to bed the night before surgery  Place CLEAN SHEETS on your  bed the night before your surgery  DO NOT SLEEP WITH PETS.   Day of Surgery:  Take a shower with CHG soap. Wear Clean/Comfortable clothing the morning of surgery Do not apply any deodorants/lotions.   Remember to brush your teeth WITH YOUR REGULAR TOOTHPASTE.    If you received a COVID test during your pre-op visit, it is requested that you wear a mask when out in public, stay away from anyone that may not be feeling well, and notify your surgeon if you develop symptoms. If you have been in contact with anyone that has tested positive in the last 10 days, please notify your surgeon.    Please read over the following fact sheets that you were given.

## 2022-08-10 ENCOUNTER — Encounter (HOSPITAL_COMMUNITY)
Admission: RE | Admit: 2022-08-10 | Discharge: 2022-08-10 | Disposition: A | Discharge status: Home / Self Care | Payer: Medicare Other | Source: Ambulatory Visit | Attending: Vascular Surgery | Admitting: Vascular Surgery

## 2022-08-10 ENCOUNTER — Encounter: Payer: Self-pay | Admitting: Internal Medicine

## 2022-08-10 ENCOUNTER — Other Ambulatory Visit: Payer: Self-pay

## 2022-08-10 ENCOUNTER — Encounter (HOSPITAL_COMMUNITY): Payer: Self-pay

## 2022-08-10 VITALS — BP 99/69 | HR 73 | Temp 97.9°F | Resp 17 | Ht 73.0 in | Wt 222.9 lb

## 2022-08-10 DIAGNOSIS — J432 Centrilobular emphysema: Secondary | ICD-10-CM | POA: Insufficient documentation

## 2022-08-10 DIAGNOSIS — I2510 Atherosclerotic heart disease of native coronary artery without angina pectoris: Secondary | ICD-10-CM | POA: Diagnosis not present

## 2022-08-10 DIAGNOSIS — Z01812 Encounter for preprocedural laboratory examination: Secondary | ICD-10-CM | POA: Diagnosis present

## 2022-08-10 DIAGNOSIS — I110 Hypertensive heart disease with heart failure: Secondary | ICD-10-CM | POA: Diagnosis not present

## 2022-08-10 DIAGNOSIS — Z419 Encounter for procedure for purposes other than remedying health state, unspecified: Secondary | ICD-10-CM

## 2022-08-10 DIAGNOSIS — I5022 Chronic systolic (congestive) heart failure: Secondary | ICD-10-CM | POA: Insufficient documentation

## 2022-08-10 DIAGNOSIS — Z87891 Personal history of nicotine dependence: Secondary | ICD-10-CM | POA: Diagnosis not present

## 2022-08-10 DIAGNOSIS — I714 Abdominal aortic aneurysm, without rupture, unspecified: Secondary | ICD-10-CM

## 2022-08-10 DIAGNOSIS — Z951 Presence of aortocoronary bypass graft: Secondary | ICD-10-CM | POA: Diagnosis not present

## 2022-08-10 DIAGNOSIS — I6523 Occlusion and stenosis of bilateral carotid arteries: Secondary | ICD-10-CM | POA: Insufficient documentation

## 2022-08-10 DIAGNOSIS — I700 Atherosclerosis of aorta: Secondary | ICD-10-CM | POA: Insufficient documentation

## 2022-08-10 DIAGNOSIS — Z01818 Encounter for other preprocedural examination: Secondary | ICD-10-CM

## 2022-08-10 HISTORY — DX: Abdominal aortic aneurysm, without rupture, unspecified: I71.40

## 2022-08-10 LAB — COMPREHENSIVE METABOLIC PANEL
ALT: 14 U/L (ref 0–44)
AST: 21 U/L (ref 15–41)
Albumin: 3.9 g/dL (ref 3.5–5.0)
Alkaline Phosphatase: 75 U/L (ref 38–126)
Anion gap: 8 (ref 5–15)
BUN: 14 mg/dL (ref 8–23)
CO2: 26 mmol/L (ref 22–32)
Calcium: 9.8 mg/dL (ref 8.9–10.3)
Chloride: 105 mmol/L (ref 98–111)
Creatinine, Ser: 1.08 mg/dL (ref 0.61–1.24)
GFR, Estimated: >60 mL/min (ref >60)
Glucose, Bld: 97 mg/dL (ref 70–99)
Potassium: 4.4 mmol/L (ref 3.5–5.1)
Sodium: 139 mmol/L (ref 135–145)
Total Bilirubin: <0.1 mg/dL — ABNORMAL LOW (ref 0.3–1.2)
Total Protein: 8.3 g/dL — ABNORMAL HIGH (ref 6.5–8.1)

## 2022-08-10 LAB — CBC
HCT: 46.9 % (ref 39.0–52.0)
Hemoglobin: 15.6 g/dL (ref 13.0–17.0)
MCH: 30.6 pg (ref 26.0–34.0)
MCHC: 33.3 g/dL (ref 30.0–36.0)
MCV: 92 fL (ref 80.0–100.0)
Platelets: 242 10*3/uL (ref 150–400)
RBC: 5.1 MIL/uL (ref 4.22–5.81)
RDW: 15.2 % (ref 11.5–15.5)
WBC: 10 10*3/uL (ref 4.0–10.5)
nRBC: 0 % (ref 0.0–0.2)

## 2022-08-10 LAB — URINALYSIS, ROUTINE W REFLEX MICROSCOPIC
Bacteria, UA: NONE SEEN
Bilirubin Urine: NEGATIVE
Glucose, UA: >=500 mg/dL — AB
Hgb urine dipstick: NEGATIVE
Ketones, ur: NEGATIVE mg/dL
Leukocytes,Ua: NEGATIVE
Nitrite: NEGATIVE
Protein, ur: NEGATIVE mg/dL
Specific Gravity, Urine: 1.013 (ref 1.005–1.030)
pH: 5 (ref 5.0–8.0)

## 2022-08-10 LAB — TYPE AND SCREEN
ABO/RH(D): O POS
Antibody Screen: NEGATIVE

## 2022-08-10 LAB — APTT: aPTT: 38 seconds — ABNORMAL HIGH (ref 24–36)

## 2022-08-10 LAB — SURGICAL PCR SCREEN
MRSA, PCR: NEGATIVE
Staphylococcus aureus: NEGATIVE

## 2022-08-10 LAB — PROTIME-INR
INR: 1.3 — ABNORMAL HIGH (ref 0.8–1.2)
Prothrombin Time: 16 seconds — ABNORMAL HIGH (ref 11.4–15.2)

## 2022-08-10 NOTE — Progress Notes (Signed)
PCP - Leretha Pol NP Cardiologist - Dr. Peter Martinique  PPM/ICD - ICD Device Orders - Requested Rep Notified - Yes  Chest x-ray - N/A EKG - 03/10/22 Stress Test - Denies ECHO - 01/04/21 Cardiac Cath - 01/04/21   Sleep Study - Denies  Diabetes: Denies  Blood Thinner Instructions: Follow surgeon's instructions on when to stop Eliquis. Aspirin Instructions: Continue  ERAS Protcol - No  COVID TEST- N/A   Anesthesia review: Yes, cardiac hx  Patient denies shortness of breath, fever, cough and chest pain at PAT appointment   All instructions explained to the patient, with a verbal understanding of the material. Patient agrees to go over the instructions while at home for a better understanding. Patient also instructed to self quarantine after being tested for COVID-19. The opportunity to ask questions was provided.

## 2022-08-10 NOTE — Pre-Procedure Instructions (Addendum)
Surgical Instructions    Your procedure is scheduled on Friday, August 18, 2022 at 7:30 AM.  Report to [facility] Main Entrance "A" at 5:30 A.M., then check in with the Admitting office.  Call this number if you have problems the morning of surgery:  [phone]   If you have any questions prior to your surgery date call [phone]: Open Monday-Friday 8am-4pm    Remember:  Do not eat or drink after midnight the night before your surgery    Take these medicines the morning of surgery with A SIP OF WATER:  Aspirin allopurinol (ZYLOPRIM) atorvastatin (LIPITOR) carvedilol (COREG) colchicine ezetimibe (ZETIA) isosorbide mononitrate (IMDUR) SYMBICORT 160-4.5 MCG/ACT inhaler tamsulosin (FLOMAX) tiotropium (SPIRIVA)  Please bring all inhalers with you the day of surgery.   IF NEEDED: acetaminophen (TYLENOL)  nitroGLYCERIN (NITROSTAT)- call if you take this  YOU WILL NEED TO HOLD YOUR  ELIQUIS FOR 2 DAYS BEFORE YOUR PROCEDURE. TAKE YOUR LAST DOSE ON 08/15/22.   As of today, STOP taking any Aleve, Naproxen, Ibuprofen, Motrin, Advil, Goody's, BC's, all herbal medications, fish oil, and all vitamins.  Do NOT Smoke (Tobacco/Vaping)  24 hours prior to your procedure  If you use a CPAP at night, you may bring your mask for your overnight stay.   Contacts, glasses, hearing aids, dentures or partials may not be worn into surgery, please bring cases for these belongings   For patients admitted to the hospital, discharge time will be determined by your treatment team.   Patients discharged the day of surgery will not be allowed to drive home, and someone needs to stay with them for 24 hours.  SURGICAL WAITING ROOM VISITATION Patients having surgery or a procedure may have no more than 2 support people in the waiting area - these visitors may rotate.   Children under the age of 65 must have an adult with them who is not the patient. If the patient needs to stay at the hospital  during part of their recovery, the visitor guidelines for inpatient rooms apply. Pre-op nurse will coordinate an appropriate time for 1 support person to accompany patient in pre-op.  This support person may not rotate.   Please refer to the [facility] website for the visitor guidelines for Inpatients (after your surgery is over and you are in a regular room).    Special instructions:    Oral Hygiene is also important to reduce your risk of infection.  Remember - BRUSH YOUR TEETH THE MORNING OF SURGERY WITH YOUR REGULAR TOOTHPASTE   [place]- Preparing For Surgery  Before surgery, you can play an important role. Because skin is not sterile, your skin needs to be as free of germs as possible. You can reduce the number of germs on your skin by washing with CHG (chlorahexidine gluconate) Soap before surgery.  CHG is an antiseptic cleaner which kills germs and bonds with the skin to continue killing germs even after washing.     Please do not use if you have an allergy to CHG or antibacterial soaps. If your skin becomes reddened/irritated stop using the CHG.  Do not shave (including legs and underarms) for at least 48 hours prior to first CHG shower. It is OK to shave your face.  Please follow these instructions carefully.     Shower the NIGHT BEFORE SURGERY and the MORNING OF SURGERY with CHG Soap.   If you chose to wash your hair, wash your hair first as usual with your normal shampoo. After you  shampoo, rinse your hair and body thoroughly to remove the shampoo.  Then [employer] and genitals (private parts) with your normal soap and rinse thoroughly to remove soap.  After that Use CHG Soap as you would any other liquid soap. You can apply CHG directly to the skin and wash gently with a scrungie or a clean washcloth.   Apply the CHG Soap to your body ONLY FROM THE NECK DOWN.  Do not use on open wounds or open sores. Avoid contact with your eyes, ears, mouth and genitals (private parts).  Wash Face and genitals (private parts)  with your normal soap.   Wash thoroughly, paying special attention to the area where your surgery will be performed.  Thoroughly rinse your body with warm water from the neck down.  DO NOT shower/wash with your normal soap after using and rinsing off the CHG Soap.  Pat yourself dry with a CLEAN TOWEL.  Wear CLEAN PAJAMAS to bed the night before surgery  Place CLEAN SHEETS on your bed the night before your surgery  DO NOT SLEEP WITH PETS.   Day of Surgery:  Take a shower with CHG soap. Wear Clean/Comfortable clothing the morning of surgery Do not apply any deodorants/lotions.   Do not wear jewelry. Do not wear lotions, powders, cologne or deodorant. Men may shave face and neck. Do not bring valuables to the hospital. [facility] is not responsible for any belongings or valuables.   Remember to brush your teeth WITH YOUR REGULAR TOOTHPASTE.    If you received a COVID test during your pre-op visit, it is requested that you wear a mask when out in public, stay away from anyone that may not be feeling well, and notify your surgeon if you develop symptoms. If you have been in contact with anyone that has tested positive in the last 10 days, please notify your surgeon.    Please read over the following fact sheets that you were given.

## 2022-08-10 NOTE — Progress Notes (Signed)
[facility] DEVICE PROGRAMMING  Patient Information: Name:  Deunte Bledsoe  DOB:  August 11, 1956  MRN:  [id number]     Device Information:  Clinic EP Physician:  Cristopher Peru, MD  Planned Procedure:  Abdominal Aortic Endovascular Stent Graft  Surgeon:  Dr. Servando Snare  Date of Procedure:  08/18/22 @ 0730  Position during surgery:  Supine   Please send documentation back to:  Zac[facility] Pontes (Fax # [phone])   Device Type:  Defibrillator Manufacturer and Phone #:  [employer]: [phone] Pacemaker Dependent?:  No. Date of Last Device Check:  07/13/22 Normal Device Function?:  Yes.    Electrophysiologist's Recommendations:  Have magnet available. Provide continuous ECG monitoring when magnet is used or reprogramming is to be performed.  Procedure will likely interfere with device function.  Device should be programmed:  Tachy therapies disabled  Per Device Clinic Standing Orders, Simone Curia, RN  3:52 PM 08/10/2022

## 2022-08-11 ENCOUNTER — Encounter (HOSPITAL_COMMUNITY): Payer: Self-pay

## 2022-08-11 NOTE — Progress Notes (Signed)
Anesthesia Chart Review:  Case: [id number] Date/Time: 08/18/22 0715   Procedure: ABDOMINAL AORTIC ENDOVASCULAR STENT GRAFT   Anesthesia type: General   Pre-op diagnosis: Abdominal aortic aneurysm   Location: [facility] OR ROOM 16 / [place] OR   Surgeons: Waynetta Sandy, MD       DISCUSSION: Patient is a 66 year old male scheduled for the above procedure.  History includes former smoker, CAD (RCA stent 2000; STEMI s/p DES LAD and CTO pRCA 12/21/13; s/p DES RCA complicated by coronary perforation w/ tamponade s/p pericardiocentesis & sealed perforation sealed with a Graftmaster coated stent 02/03/15; NSTEMI 06/13/15 with occluded RCA stent; CABG 09/02/15: LIMA-dLAD, SVG-Ramus INT; unsuccessful attempt at CX PCI, medical therapy 01/04/21), ischemic cardiomyopathy, chronic systolic CHF (EF 01-02% 06/2535), ICD (Biotronik ICD 04/17/14), PAF (02/2015 in setting of pericarditis post coronary perforation/pericardiocentesis), ascending TAA (4.1 cm 12/2020 CT), AAA (5.4 cm 07/2022 CT), HLD, HTN, gout, skin cancer (BCC), anxiety, right spermatocelectomy (02/21/22).  Last EP visit with Dr. [name] on 03/10/22. Biotronik VDD ICD was working normally. No afib or VT. Denied anginal symptoms, continue current medication. On maximal medical therapy for chronic systolic HF, class 2 symptoms. Smoking cessation advised. One year follow-up planned. He previously had a telephonic preoperative evaluation on 03/06/22 with Almyra Deforest, [place] prior to him undergoing a colonoscopy (done 03/09/22).   EP Perioperative ICD recommendations: Device Information: Clinic EP Physician:  Cristopher Peru, MD  Planned Procedure:  Abdominal Aortic Endovascular Stent Graft  Surgeon:  Dr. Servando Snare  Date of Procedure:  08/18/22 @ 0730  Position during surgery:  Supine   Please send documentation back to:  Zacarias Pontes (Fax # [phone])   Device Type:  Defibrillator Manufacturer and Phone #:  [employer]: [phone] Pacemaker Dependent?:  No. Date of Last  Device Check:  07/13/22           Normal Device Function?:  Yes.     Electrophysiologist's Recommendations: Have magnet available. Provide continuous ECG monitoring when magnet is used or reprogramming is to be performed.  Procedure will likely interfere with device function.  Device should be programmed:  Tachy therapies disabled  Anesthesia team to evaluate on the day of surgery. Per [facility], he is to hold Eliquis 2 days prior to surgery, last dose scheduled for 08/15/22 (Letters tab).    VS: BP 99/69   Pulse 73   Temp 36.6 C (Oral)   Resp 17   Ht '6\' 1"'$  (1.854 m)   Wt 101.1 kg   SpO2 98%   BMI 29.41 kg/m    PROVIDERS: [name], NP is PCP  Martinique, Peter, MD is cardiologist Cristopher Peru, MD is EP cardiologist Irine Seal, MD is urologist  LABS: Labs reviewed: Acceptable for surgery. (all labs ordered are listed, but only abnormal results are displayed)  Labs Reviewed  COMPREHENSIVE METABOLIC PANEL - Abnormal; Notable for the following components:      Result Value   Total Protein 8.3 (*)    Total Bilirubin <0.1 (*)    All other components within normal limits  PROTIME-INR - Abnormal; Notable for the following components:   Prothrombin Time 16.0 (*)    INR 1.3 (*)    All other components within normal limits  APTT - Abnormal; Notable for the following components:   aPTT 38 (*)    All other components within normal limits  URINALYSIS, ROUTINE W REFLEX MICROSCOPIC - Abnormal; Notable for the following components:   Glucose, UA >=500 (*)    All other components within  normal limits  SURGICAL PCR SCREEN  CBC  TYPE AND SCREEN    PFTs 10/03/17: FVC 4.25 (81%), post 4.18 (79%). FEV1 2.57 (65%), post 2.55 (64%). DLCO unc 22.15 (60%), cor 21.62 (59%).   IMAGES: CTA Abd/pelvis 07/05/22: IMPRESSION: VASCULAR 1. Enlargement of the previously visualized fusiform infrarenal abdominal aortic aneurysm from 4.9 cm (06/21/2020) to 5.4 cm. Recommend referral to a vascular  specialist if not already obtained. This recommendation follows ACR consensus guidelines: White Paper of the ACR Incidental Findings Committee II on Vascular Findings. J Am Coll Radiol 2013; 10:789-794. 2.  Aortic Atherosclerosis (ICD10-I70.0). NON-VASCULAR 1. No acute abdominopelvic abnormality. 2. Similar appearing moderate centrilobular emphysema (ICD10-J43.9). 3. Sigmoid diverticulosis. 4. Prostatomegaly.    CTA Chest 01/03/21: IMPRESSION: - Mild dilatation of the ascending aorta to 4.1 cm is noted. Sinus of Valsalva measures approximately 4.3 cm. Sino-tubular junction measures approximately 3.1 cm. No dissection is noted. Recommend annual imaging followup by CTA or MRA. This recommendation follows 2010 ACCF/AHA/AATS/ACR/ASA/SCA/SCAI/SIR/STS/SVM Guidelines for the Diagnosis and Management of Patients with Thoracic Aortic Disease. Circulation. 2010; 121: R678-L381. Aortic aneurysm NOS (ICD10-I71.9) - Changes of prior coronary bypass grafting. - No acute abnormality in the lungs. - Aortic Atherosclerosis (ICD10-I70.0) and Emphysema (ICD10-J43.9).    EKG: 03/10/22: NSR. Anterior infarct (age undetermined). Inferolateral ST/T wave abnormality, consider ischemia.   CV:  US Carotid 12/21/21: IMPRESSION: - Moderate bilateral carotid atherosclerosis. Negative for stenosis. Degree of narrowing less than 50% bilaterally by ultrasound criteria. - Patent antegrade vertebral flow bilaterally    Cardiac cath 01/04/21: LM lesion is 50% stenosed. Patent LIMA to LAD. Patent SVG to ramus. 2nd Diag lesion is 70% stenosed. Mid RCA to Dist RCA lesion is 100% stenosed. Prox RCA to Mid RCA lesion is 100% stenosed. Ost RCA to Prox RCA lesion is 95% stenosed. Ost Cx lesion is 80% stenosed. This territory is not bypassed, but it is a relatively small vessel. Unable to advance balloon after successfully guiding the wire though the ramus graft and retrograde to the ostial circumflex and across the  lesion. The left ventricular ejection fraction is 25-35% by visual estimate. There is moderate to severe left ventricular systolic dysfunction. LV end diastolic pressure is normal. There is no aortic valve stenosis.   Medical therapy for CAD.  Unsuccessful attempt at PCI of ostial circumflex.  Restart IV heparin 8 hours post sheath pull.  OK to start [place] tomorrow for atrial fibrillation.  He was loaded with Plavix but will not continue if he is starting a DOAC.  If he is not starting a DOAC, DAPT would be reasonable for his CAD.     Echo 01/04/21: IMPRESSIONS   1. There is no left ventricular thrombus (Definity contrast was used).  There is apical dyskinesis. There is severe inferior wall hypokinesis and  inferoseptal akinesis.. Left ventricular ejection fraction, by estimation,  is 25 to 30%. The left ventricle  has severely decreased function. The left ventricle demonstrates regional  wall motion abnormalities (see scoring diagram/findings for description).  The left ventricular internal cavity size was moderately to severely  dilated. Left ventricular diastolic  parameters are consistent with Grade I diastolic dysfunction (impaired  relaxation).   2. Right ventricular systolic function is normal. The right ventricular  size is normal. There is normal pulmonary artery systolic pressure. The  estimated right ventricular systolic pressure is 01.7 mmHg.   3. Left atrial size was severely dilated.   4. The mitral valve is normal in structure. Trivial mitral valve  regurgitation.  5. The aortic valve is tricuspid. There is mild calcification of the  aortic valve. There is mild thickening of the aortic valve. Aortic valve  regurgitation is not visualized. Mild aortic valve sclerosis is present,  with no evidence of aortic valve  stenosis.   6. Aortic dilatation noted. There is mild dilatation of the aortic root,  measuring 39 mm.   7. The inferior vena cava is normal in size with  greater than 50%  respiratory variability, suggesting right atrial pressure of 3 mmHg.  - Comparison(s): Prior images unable to be directly viewed, comparison made  by report only. The left ventricular function is worsened. Multiple  previous reports from 2016 describe wall motion abnormalities in either  the right coronary artery territory or  the LAD artery territory (although not at the same time). The apex was  previously described as akinetic. On the current study there is evidence  of prior infarction in both RCA and LAD territory with a frankly  dyskinetic apex.    Past Medical History:  Diagnosis Date   Acute systolic CHF (congestive heart failure) (HCC)    AICD (automatic cardioverter/defibrillator) present    Anxiety    Ascending aortic aneurysm ([place]) 08/31/2017   Back pain 08/31/2017   Basal cell carcinoma    left arm   CAD (coronary artery disease)    a. 2000: s/p stent of RCA 2000 with BMS  b. 2015: STEMI s/p LHC with old occlusion of RCA and DESx2 to LAD  c. 03/07/55: complicated PCI on 3/2 for CTO of mid RCA with coronary perforation and cardiac tamponade requiring emergent pericardiocentesis      Cardiac tamponade    a. 02/03/15 2/2 coronary perforation during CTO procedure. Sealed with graftmaster coated stent.    COPD (chronic obstructive pulmonary disease) ([place]) 08/31/2017   Gout    History of pneumonia    HTN (hypertension)    Hyperlipidemia    Ischemic cardiomyopathy    a. 2D ECHO: EF 25-30%. Akinesis of the anteroseptal and   Left main coronary artery disease    Myocardial infarction ([place])    Obesity    S/P CABG x 2 09/02/2015   LIMA to LAD, SVG to ramus intermediate branch, EVH via right thigh    Shortness of breath dyspnea    Tobacco abuse    Urinary frequency     Past Surgical History:  Procedure Laterality Date   CARDIAC CATHETERIZATION     CARDIAC CATHETERIZATION N/A 06/14/2015   Procedure: Left Heart Cath and Coronary Angiography;  Surgeon:  Lorretta Harp, MD;  Location: [place] [facility];  Service: Cardiovascular;  Laterality: N/A;   CARDIAC CATHETERIZATION N/A 08/18/2015   Procedure: Intravascular Pressure Wire/FFR Study;  Surgeon: Peter M Martinique, MD;  Location: [place] [facility];  Service: Cardiovascular;  Laterality: N/A;   COLONOSCOPY     COLONOSCOPY WITH PROPOFOL N/A 03/09/2022   Procedure: COLONOSCOPY WITH PROPOFOL;  Surgeon: Yetta Flock, MD;  Location: [facility] ENDOS[facility];  Service: Gastroenterology;  Laterality: N/A;   CORONARY ANGIOPLASTY  2000   CORONARY ANGIOPLASTY WITH STENT PLACEMENT  2015   CORONARY ARTERY BYPASS GRAFT N/A 09/02/2015   Procedure: CORONARY ARTERY BYPASS GRAFTING (CABG) x 2 (LIMA-LAD, SVG-Intermediate) ENDOSCOPIC GREATER SAPHENOUS VEIN HARVEST RIGHT THIGH;  Surgeon: Rexene Alberts, MD;  Location: [facility];  Service: Open Heart Surgery;  Laterality: N/A;   CORONARY BALLOON ANGIOPLASTY N/A 01/04/2021   Procedure: CORONARY BALLOON ANGIOPLASTY;  Surgeon: Jettie Booze, MD;  Location:  [place] IN[facility]ASIVE [facility] LAB;  Service: Cardiovascular;  Laterality: N/A;   FOOT SURGERY     IMPLANTABLE CARDIOVERTER DEFIBRILLATOR IMPLANT N/A 04/17/2014   Procedure: IMPLANTABLE CARDIOVERTER DEFIBRILLATOR IMPLANT;  Surgeon: Evans Lance, MD;  Location: [facility] C[facility] LAB;  Service: Cardiovascular;  Laterality: N/A;   LEFT HEART CATH AND CORONARY ANGIOGRAPHY N/A 01/04/2021   Procedure: LEFT HEART CATH AND CORONARY ANGIOGRAPHY;  Surgeon: Jettie Booze, MD;  Location: [place] CV LAB;  Service: Cardiovascular;  Laterality: N/A;   LEFT HEART CATH AND CORS/GRAFTS ANGIOGRAPHY N/A 08/30/2017   Procedure: LEFT HEART CATH AND CORS/GRAFTS ANGIOGRAPHY;  Surgeon: Nelva Bush, MD;  Location: [place] [facility] LAB;  Service: Cardiovascular;  Laterality: N/A;   LEFT HEART CATHETERIZATION WITH CORONARY ANGIOGRAM N/A 12/11/2013   Procedure: LEFT HEART CATHETERIZATION WITH CORONARY ANGIOGRAM;  Surgeon: Peter M Martinique, MD;  Location: [facility] CATH LAB;   Service: Cardiovascular;  Laterality: N/A;   LEFT HEART CATHETERIZATION WITH CORONARY ANGIOGRAM N/A 01/05/2015   Procedure: LEFT HEART CATHETERIZATION WITH CORONARY ANGIOGRAM;  Surgeon: Peter M Martinique, MD;  Location: [facility] LAB;  Service: Cardiovascular;  Laterality: N/A;   PERCUTANEOUS CORONARY STENT INTERVENTION (PCI-S)  12/11/2013   Procedure: PERCUTANEOUS CORONARY STENT INTERVENTION (PCI-S);  Surgeon: Peter M Martinique, MD;  Location: [facility] CATH LAB;  Service: Cardiovascular;;  prov LAD and mid LAD   PERCUTANEOUS CORONARY STENT INTERVENTION (PCI-S) N/A 02/03/2015   Procedure: PERCUTANEOUS CORONARY STENT INTERVENTION (PCI-S);  Surgeon: Jettie Booze, MD;  Location: [facility] CATH LAB;  Service: Cardiovascular;  Laterality: N/A;   POLYPECTOMY  03/09/2022   Procedure: POLYPECTOMY;  Surgeon: Yetta Flock, MD;  Location: [facility];  Service: Gastroenterology;;   SPERMATOCELECTOMY Right 02/21/2022   Procedure: RIGHT SPERMATOCELECTOMY;  Surgeon: Irine Seal, MD;  Location: [facility];  Service: Urology;  Laterality: Right;   TEE WITHOUT CARDIOVERSION N/A 09/02/2015   Procedure: TRANSESOPHAGEAL ECHOCARDIOGRAM (TEE);  Surgeon: Rexene Alberts, MD;  Location: [place];  Service: Open Heart Surgery;  Laterality: N/A;    MEDICATIONS:  acetaminophen (TYLENOL) 325 MG tablet   allopurinol (ZYLOPRIM) 300 MG tablet   apixaban (ELIQUIS) 5 MG TABS tablet   Ascorbic Acid (VITAMIN C) 1000 MG tablet   aspirin EC 81 MG tablet   atorvastatin (LIPITOR) 80 MG tablet   Calcium Carbonate-Vit D-Min (CALCIUM 1200 PO)   carvedilol (COREG) 6.25 MG tablet   cholecalciferol (VITAMIN D3) 25 MCG (1000 UNIT) tablet   colchicine 0.6 MG tablet   empagliflozin (JARDIANCE) 10 MG TABS tablet   ezetimibe (ZETIA) 10 MG tablet   furosemide (LASIX) 40 MG tablet   isosorbide mononitrate (IMDUR) 30 MG 24 hr tablet   Magnesium Oxide 200 MG TABS   Multiple Vitamin (MULTIVITAMIN) tablet   nicotine (NICODERM CQ - DOSED IN MG/24 HOURS) 21  mg/24hr patch   nicotine polacrilex (COMMIT) 4 MG lozenge   nitroGLYCERIN (NITROSTAT) 0.4 MG SL tablet   OMEGA-3 FATTY ACIDS PO   sacubitril-valsartan (ENTRESTO) 24-26 MG   sildenafil (VIAGRA) 50 MG tablet   spironolactone (ALDACTONE) 25 MG tablet   SYMBICORT 160-4.5 MCG/ACT inhaler   tamsulosin (FLOMAX) 0.4 MG CAPS capsule   tiotropium (SPIRIVA) 18 MCG inhalation capsule   traZODone (DESYREL) 50 MG tablet   No current facility-administered medications for this encounter.    Myra Gianotti, PA-C Surgical Short Stay/Anesthesiology [facility] Phone [phone] [facility] Phone [phone] 08/11/2022 7:46 PM

## 2022-08-14 ENCOUNTER — Telehealth: Payer: Self-pay | Admitting: *Deleted

## 2022-08-14 ENCOUNTER — Ambulatory Visit: Payer: Medicare Other | Attending: Physician Assistant | Admitting: Physician Assistant

## 2022-08-14 DIAGNOSIS — Z01810 Encounter for preprocedural cardiovascular examination: Secondary | ICD-10-CM

## 2022-08-14 NOTE — Progress Notes (Signed)
Pt called concerning his [name], he states he didn't see it listed in the medications that he could take day of surgery. I told him that is one of the medications the anesthesiologist does not want taken the day of surgery. He voiced understanding.

## 2022-08-14 NOTE — Telephone Encounter (Signed)
   Pre-operative Risk Assessment    Patient Name: Montrell Cessna  DOB: July 03, 1956 MRN: [id number]      Request for Surgical Clearance    Procedure:   ABDOMINAL AORTIC ENDOVASCULAR STENT  GRAFT  Date of Surgery:  Clearance 08/18/22                             .    Surgeon:  DR. Servando Snare Surgeon's Group or Practice Name:  [facility] Phone number:  [phone] Fax number:  [phone]   Type of Clearance Requested:   - Medical  - Pharmacy:  Hold Aspirin and Apixaban (Eliquis)     Type of Anesthesia:  General    Additional requests/questions:    Jiles Prows   08/14/2022, 11:57 AM

## 2022-08-14 NOTE — Telephone Encounter (Signed)
Pt agreeable to plan of care for URGENT ADD ON TODAY 08/14/22 @ 2 PM PER PRE OP PROVIDER. Med rec and consent are done.

## 2022-08-14 NOTE — Progress Notes (Signed)
Virtual Visit via Telephone Note   Because of [name]'s co-morbid illnesses, he is at least at moderate risk for complications without adequate follow up.  This format is felt to be most appropriate for this patient at this time.  The patient did not have access to video technology/had technical difficulties with video requiring transitioning to audio format only (telephone).  All issues noted in this document were discussed and addressed.  No physical exam could be performed with this format.  Please refer to the patient's chart for his consent to telehealth for [facility].  Evaluation Performed:  Preoperative cardiovascular risk assessment _____________   Date:  08/14/2022   Patient ID:  Kerry Mason, DOB [birth year], MRN [id number] Patient Location:  Home Provider location:   Office  Primary Care Provider:  [name], NP Primary Cardiologist:  Peter Martinique, MD  Chief Complaint / Patient Profile   66 y.o. y/o male with a h/o CAD status post CABG and remote PCI's, chronic systolic heart failure, status post [place] VDD ICD insertion, abdominal aortic aneurysm, hypertension, hyperlipidemia who is pending abdominal aortic endovascular stent graft and presents today for telephonic preoperative cardiovascular risk assessment.  Past Medical History    Past Medical History:  Diagnosis Date   AAA (abdominal aortic aneurysm) (HCC)    Acute systolic CHF (congestive heart failure) ([place])    AICD (automatic cardioverter/defibrillator) present    Anxiety    Ascending aortic aneurysm ([place]) 08/31/2017   Back pain 08/31/2017   Basal cell carcinoma    left arm   CAD (coronary artery disease)    a. 2000: s/p stent of RCA 2000 with BMS  b. 2015: STEMI s/p LHC with old occlusion of RCA and DESx2 to LAD  c. 07/12/37: complicated PCI on 3/2 for CTO of mid RCA with coronary perforation and cardiac tamponade requiring emergent pericardiocentesis      Cardiac tamponade    a.  02/03/15 2/2 coronary perforation during CTO procedure. Sealed with graftmaster coated stent.    COPD (chronic obstructive pulmonary disease) ([place]) 08/31/2017   Gout    History of pneumonia    HTN (hypertension)    Hyperlipidemia    Ischemic cardiomyopathy    a. 2D ECHO: EF 25-30%. Akinesis of the anteroseptal and   Left main coronary artery disease    Myocardial infarction ([place])    Obesity    S/P CABG x 2 09/02/2015   LIMA to LAD, SVG to ramus intermediate branch, EVH via right thigh    Shortness of breath dyspnea    Tobacco abuse    Urinary frequency    Past Surgical History:  Procedure Laterality Date   CARDIAC CATHETERIZATION     CARDIAC CATHETERIZATION N/A 06/14/2015   Procedure: Left Heart Cath and Coronary Angiography;  Surgeon: Lorretta Harp, MD;  Location: [place] [facility];  Service: Cardiovascular;  Laterality: N/A;   CARDIAC CATHETERIZATION N/A 08/18/2015   Procedure: Intravascular Pressure Wire/FFR Study;  Surgeon: Peter M Martinique, MD;  Location: [place] [facility];  Service: Cardiovascular;  Laterality: N/A;   COLONOSCOPY     COLONOSCOPY WITH PROPOFOL N/A 03/09/2022   Procedure: COLONOSCOPY WITH PROPOFOL;  Surgeon: Yetta Flock, MD;  Location: [facility];  Service: Gastroenterology;  Laterality: N/A;   CORONARY ANGIOPLASTY  2000   CORONARY ANGIOPLASTY WITH STENT PLACEMENT  2015   CORONARY ARTERY BYPASS GRAFT N/A 09/02/2015   Procedure: CORONARY ARTERY BYPASS GRAFTING (CABG) x 2 (LIMA-LAD, SVG-Intermediate) ENDOSCOPIC GREATER SAPHENOUS VEIN  HARVEST RIGHT THIGH;  Surgeon: Rexene Alberts, MD;  Location: [place];  Service: Open Heart Surgery;  Laterality: N/A;   CORONARY BALLOON ANGIOPLASTY N/A 01/04/2021   Procedure: CORONARY BALLOON ANGIOPLASTY;  Surgeon: Jettie Booze, MD;  Location: [place] [facility];  Service: Cardiovascular;  Laterality: N/A;   FOOT SURGERY     IMPLANTABLE CARDIOVERTER DEFIBRILLATOR IMPLANT N/A 04/17/2014   Procedure: IMPLANTABLE  CARDIOVERTER DEFIBRILLATOR IMPLANT;  Surgeon: Evans Lance, MD;  Location: [facility] CATH LAB;  Service: Cardiovascular;  Laterality: N/A;   LEFT HEART CATH AND CORONARY ANGIOGRAPHY N/A 01/04/2021   Procedure: LEFT HEART CATH AND CORONARY ANGIOGRAPHY;  Surgeon: Jettie Booze, MD;  Location: [place] [facility];  Service: Cardiovascular;  Laterality: N/A;   LEFT HEART CATH AND CORS/GRAFTS ANGIOGRAPHY N/A 08/30/2017   Procedure: LEFT HEART CATH AND CORS/GRAFTS ANGIOGRAPHY;  Surgeon: Nelva Bush, MD;  Location: [place] [facility];  Service: Cardiovascular;  Laterality: N/A;   LEFT HEART CATHETERIZATION WITH CORONARY ANGIOGRAM N/A 12/11/2013   Procedure: LEFT HEART CATHETERIZATION WITH CORONARY ANGIOGRAM;  Surgeon: Peter M Martinique, MD;  Location: [facility] CATH LAB;  Service: Cardiovascular;  Laterality: N/A;   LEFT HEART CATHETERIZATION WITH CORONARY ANGIOGRAM N/A 01/05/2015   Procedure: LEFT HEART CATHETERIZATION WITH CORONARY ANGIOGRAM;  Surgeon: Peter M Martinique, MD;  Location: [facility] CATH LAB;  Service: Cardiovascular;  Laterality: N/A;   PERCUTANEOUS CORONARY STENT INTERVENTION (PCI-S)  12/11/2013   Procedure: PERCUTANEOUS CORONARY STENT INTERVENTION (PCI-S);  Surgeon: Peter M Martinique, MD;  Location: [facility] CATH LAB;  Service: Cardiovascular;;  prov LAD and mid LAD   PERCUTANEOUS CORONARY STENT INTERVENTION (PCI-S) N/A 02/03/2015   Procedure: PERCUTANEOUS CORONARY STENT INTERVENTION (PCI-S);  Surgeon: Jettie Booze, MD;  Location: [facility] CATH LAB;  Service: Cardiovascular;  Laterality: N/A;   POLYPECTOMY  03/09/2022   Procedure: POLYPECTOMY;  Surgeon: Yetta Flock, MD;  Location: [facility];  Service: Gastroenterology;;   SPERMATOCELECTOMY Right 02/21/2022   Procedure: RIGHT SPERMATOCELECTOMY;  Surgeon: Irine Seal, MD;  Location: [facility];  Service: Urology;  Laterality: Right;   TEE WITHOUT CARDIOVERSION N/A 09/02/2015   Procedure: TRANSESOPHAGEAL ECHOCARDIOGRAM (TEE);  Surgeon: Rexene Alberts, MD;  Location:  [place];  Service: Open Heart Surgery;  Laterality: N/A;    Allergies  No Known Allergies  History of Present Illness    Kerry Mason is a 66 y.o. male who presents via audio/video conferencing for a telehealth visit today.  Pt was last seen in cardiology clinic on 03/10/22 by Dr. Martinique.  At that time Kerry Mason was doing well .  The patient is now pending procedure as outlined above. Since his last visit, he has had no issues with his defibrillator.  He denies any new chest pain or shortness of breath.  He does walk 10,000 steps a day and stairs do not bother him.  He does not like housework but he can do it.  [place]  Surgery is Friday, 08/18/2022 so he was added on urgently today for cardiac clearance.  He will need to hold his Eliquis 2 days prior to surgery per our office protocol.  Hopefully, he is already holding his aspirin.  He may hold his aspirin 7 days prior to the procedure and restart both of these medications as soon as deemed medically safe to do so.   Home Medications    Prior to Admission medications   Medication Sig Start Date End Date Taking? Authorizing Provider  acetaminophen (  TYLENOL) 325 MG tablet Take 2 tablets (650 mg total) by mouth every 4 (four) hours as needed for headache or mild pain. 06/16/15   Erlene Quan, PA-C  allopurinol (ZYLOPRIM) 300 MG tablet Take 1 tablet (300 mg total) by mouth 2 (two) times daily. 01/04/22   [name], NP  apixaban (ELIQUIS) 5 MG TABS tablet Take 1 tablet (5 mg total) by mouth 2 (two) times daily. 12/19/21   Martinique, Peter M, MD  Ascorbic Acid (VITAMIN C) 1000 MG tablet Take 1,000 mg by mouth daily.    [provider]  aspirin EC 81 MG tablet Take 81 mg by mouth daily.    [provider]  atorvastatin (LIPITOR) 80 MG tablet Take 1 tablet (80 mg total) by mouth daily. 11/03/21 08/10/22  Martinique, Peter M, MD  Calcium Carbonate-Vit D-Min (CALCIUM 1200 PO) Take 1 capsule  by mouth daily.    [provider]  carvedilol (COREG) 6.25 MG tablet Take 1 tablet (6.25 mg total) by mouth 2 (two) times daily. 11/03/21   Martinique, Peter M, MD  cholecalciferol (VITAMIN D3) 25 MCG (1000 UNIT) tablet Take 1,000 Units by mouth daily.    [facility], Tami Lin, PA  colchicine 0.6 MG tablet TAKE 2 TABLETS ONE TIME. THEN TAKE 1 TABLET AFTER ONE HOUR. THEN TAKE 1 TABLET DAILY FOR 5 DAYS. Patient taking differently: Take 0.6-1.2 mg by mouth See admin instructions. TAKE 2 TABLETS ONE TIME. THEN TAKE 1 TABLET AFTER ONE HOUR. THEN TAKE 1 TABLET DAILY FOR 5 DAYS. 07/24/22   [name], NP  empagliflozin (JARDIANCE) 10 MG TABS tablet Take 1 tablet (10 mg total) by mouth daily. 03/23/22   Martinique, Peter M, MD  ezetimibe (ZETIA) 10 MG tablet Take 1 tablet (10 mg total) by mouth daily. 11/03/21   Martinique, Peter M, MD  furosemide (LASIX) 40 MG tablet TAKE 1 TABLET BY MOUTH EVERY DAY Patient taking differently: Take 40 mg by mouth daily. 05/11/22   Martinique, Peter M, MD  isosorbide mononitrate (IMDUR) 30 MG 24 hr tablet Take 1 tablet (30 mg total) by mouth daily. 11/03/21   Martinique, Peter M, MD  Magnesium Oxide 200 MG TABS Take 1 tablet (200 mg total) by mouth 2 (two) times daily. Patient taking differently: Take 200 mg by mouth daily. 01/21/21   Ledora Bottcher, PA  Multiple Vitamin (MULTIVITAMIN) tablet Take 1 tablet by mouth daily.    [provider]  nicotine (NICODERM CQ - DOSED IN MG/24 HOURS) 21 mg/24hr patch Place 1 patch (21 mg total) onto the skin daily. 03/23/22   Martinique, Peter M, MD  nicotine polacrilex (COMMIT) 4 MG lozenge Take 1 lozenge (4 mg total) by mouth as needed for smoking cessation. 03/23/22   Martinique, Peter M, MD  nitroGLYCERIN (NITROSTAT) 0.4 MG SL tablet DISSOLVE 1 TABLET UNDER THE TONGUE EVERY 5 MINUTES AS NEEDED FOR CHEST PAIN. MAX 3 DOSES/15 MIN Patient taking differently: Place 0.4 mg under the tongue every 5 (five) minutes as needed for chest pain. 07/25/22    Martinique, Peter M, MD  OMEGA-3 FATTY ACIDS PO Take 1,200 mg by mouth daily.    [provider]  sacubitril-valsartan (ENTRESTO) 24-26 MG Take 1 tablet by mouth 2 (two) times daily. 12/22/21   Martinique, Peter M, MD  sildenafil (VIAGRA) 50 MG tablet TAKE 1 TABLET BY MOUTH DAILY AS NEEDED FOR ERECTILE DYSFUNCTION. Patient taking differently: Take 50 mg by mouth daily as needed for erectile dysfunction. 11/30/21  Martinique, Peter M, MD  spironolactone (ALDACTONE) 25 MG tablet Take 25 mg by mouth 2 (two) times a week. 01/06/22   Martinique, Peter M, MD  SYMBICORT 160-4.5 MCG/ACT inhaler INHALE 2 PUFFS INTO THE LUNGS TWICE A DAY Patient taking differently: Inhale 2 puffs into the lungs 2 (two) times daily. 07/18/22   [name], NP  tamsulosin (FLOMAX) 0.4 MG CAPS capsule Take 0.4 mg by mouth daily. 01/04/22   [provider]  tiotropium (SPIRIVA) 18 MCG inhalation capsule Place 1 capsule (18 mcg total) into inhaler and inhale daily. 01/04/22   [name], NP  traZODone (DESYREL) 50 MG tablet TAKE 0.5-1 TABLETS BY MOUTH AT BEDTIME AS NEEDED FOR SLEEP. Patient taking differently: Take 25-50 mg by mouth at bedtime as needed for sleep. 05/22/22   [name], NP    Physical Exam    Vital Signs:  Kerry Mason does not have vital signs available for review today.  Given telephonic nature of communication, physical exam is limited. AAOx3. NAD. Normal affect.  Speech and respirations are unlabored.  Accessory Clinical Findings    None  Assessment & Plan    1.  Preoperative Cardiovascular Risk Assessment:  Kerry Mason perioperative risk of a major cardiac event is 11% according to the Revised Cardiac Risk Index (RCRI).  Therefore, he is at high risk for perioperative complications.   His functional capacity is good at 5.62 METs according to the [facility] Activity Status Index (DASI). Recommendations: According to ACC/AHA guidelines, no further cardiovascular testing needed.  The  patient may proceed to surgery at acceptable risk.   Antiplatelet and/or Anticoagulation Recommendations: Aspirin can be held for 7 days prior to his surgery.  Please resume Aspirin post operatively when it is felt to be safe from a bleeding standpoint.  Eliquis (Apixaban) can be held for 2 days prior to surgery.  Please resume post op when felt to be safe.    A copy of this note will be routed to requesting surgeon.  Time:   Today, I have spent 12 minutes with the patient with telehealth technology discussing medical history, symptoms, and management plan.     Elgie Collard, PA-C  08/14/2022, 2:05 PM

## 2022-08-14 NOTE — Telephone Encounter (Signed)
Patient with diagnosis of afib on Eliquis for anticoagulation.    Procedure:  ABDOMINAL AORTIC ENDOVASCULAR STENT  GRAFT Date of procedure: 08/18/22   CHA2DS2-VASc Score = 4   This indicates a 4.8% annual risk of stroke. The patient's score is based upon: CHF History: 1 HTN History: 1 Diabetes History: 0 Stroke History: 0 Vascular Disease History: 1 Age Score: 1 Gender Score: 0      CrCl 76 ml/min  Per office protocol, patient can hold Eliquis for 2 days prior to procedure.    **This guidance is not considered finalized until pre-operative APP has relayed final recommendations.**

## 2022-08-14 NOTE — Telephone Encounter (Signed)
Pt agreeable to plan of care for URGENT ADD ON TODAY 08/14/22 @ 2 PM PER PRE OP PROVIDER. Med rec and consent are done.       Patient Consent for Virtual Visit        Kerry Mason has provided verbal consent on 08/14/2022 for a virtual visit (video or telephone).   CONSENT FOR VIRTUAL VISIT FOR:  Kerry Mason  By participating in this virtual visit I agree to the following:  I hereby voluntarily request, consent and authorize [facility] and its employed or contracted physicians, physician assistants, nurse practitioners or other licensed health care professionals (the Practitioner), to provide me with telemedicine health care services (the "Services") as deemed necessary by the treating Practitioner. I acknowledge and consent to receive the Services by the Practitioner via telemedicine. I understand that the telemedicine visit will involve communicating with the Practitioner through live audiovisual communication technology and the disclosure of certain medical information by electronic transmission. I acknowledge that I have been given the opportunity to request an in-person assessment or other available alternative prior to the telemedicine visit and am voluntarily participating in the telemedicine visit.  I understand that I have the right to withhold or withdraw my consent to the use of telemedicine in the course of my care at any time, without affecting my right to future care or treatment, and that the Practitioner or I may terminate the telemedicine visit at any time. I understand that I have the right to inspect all information obtained and/or recorded in the course of the telemedicine visit and may receive copies of available information for a reasonable fee.  I understand that some of the potential risks of receiving the Services via telemedicine include:  Delay or interruption in medical evaluation due to technological equipment failure or disruption; Information  transmitted may not be sufficient (e.g. poor resolution of images) to allow for appropriate medical decision making by the Practitioner; and/or  In rare instances, security protocols could fail, causing a breach of personal health information.  Furthermore, I acknowledge that it is my responsibility to provide information about my medical history, conditions and care that is complete and accurate to the best of my ability. I acknowledge that Practitioner's advice, recommendations, and/or decision may be based on factors not within their control, such as incomplete or inaccurate data provided by me or distortions of diagnostic images or specimens that may result from electronic transmissions. I understand that the practice of medicine is not an exact science and that Practitioner makes no warranties or guarantees regarding treatment outcomes. I acknowledge that a copy of this consent can be made available to me via my patient portal ([facility]), or I can request a printed copy by calling the office of [facility].    I understand that my insurance will be billed for this visit.   I have read or had this consent read to me. I understand the contents of this consent, which adequately explains the benefits and risks of the Services being provided via telemedicine.  I have been provided ample opportunity to ask questions regarding this consent and the Services and have had my questions answered to my satisfaction. I give my informed consent for the services to be provided through the use of telemedicine in my medical care

## 2022-08-15 NOTE — Anesthesia Preprocedure Evaluation (Addendum)
Anesthesia Evaluation  Patient identified by MRN, date of birth, ID band Patient awake    Reviewed: Allergy & Precautions, NPO status , Patient's Chart, lab work & pertinent test results, reviewed documented beta blocker date and time   Airway Mallampati: III  TM Distance: >3 FB Neck ROM: Full    Dental  (+) Dental Advisory Given, Caps, Loose, Missing,    Pulmonary COPD,  COPD inhaler, Current SmokerPatient did not abstain from smoking.,    Pulmonary exam normal breath sounds clear to auscultation       Cardiovascular hypertension, Pt. on home beta blockers and Pt. on medications (-) angina+ CAD, + Past MI, + Cardiac Stents, + CABG, + Peripheral Vascular Disease (AAA) and +CHF  Normal cardiovascular exam+ Cardiac Defibrillator  Rhythm:Regular Rate:Normal  Echo 01/04/21: 1. There is no left ventricular thrombus (Definity contrast was used).  There is apical dyskinesis. There is severe inferior wall hypokinesis and  inferoseptal akinesis.. Left ventricular ejection fraction, by estimation,  is 25 to 30%. The left ventricle  has severely decreased function. The left ventricle demonstrates regional  wall motion abnormalities (see scoring diagram/findings for description).  The left ventricular internal cavity size was moderately to severely  dilated. Left ventricular diastolic  parameters are consistent with Grade I diastolic dysfunction (impaired  relaxation).  2. Right ventricular systolic function is normal. The right ventricular  size is normal. There is normal pulmonary artery systolic pressure. The  estimated right ventricular systolic pressure is 16.1 mmHg.  3. Left atrial size was severely dilated.  4. The mitral valve is normal in structure. Trivial mitral valve  regurgitation.  5. The aortic valve is tricuspid. There is mild calcification of the  aortic valve. There is mild thickening of the aortic valve. Aortic valve   regurgitation is not visualized. Mild aortic valve sclerosis is present,  with no evidence of aortic valve  stenosis.  6. Aortic dilatation noted. There is mild dilatation of the aortic root,  measuring 39 mm.  7. The inferior vena cava is normal in size with greater than 50%  respiratory variability, suggesting right atrial pressure of 3 mmHg.   Neuro/Psych PSYCHIATRIC DISORDERS Anxiety negative neurological ROS     GI/Hepatic negative GI ROS, Neg liver ROS,   Endo/Other  negative endocrine ROS  Renal/GU negative Renal ROS     Musculoskeletal  (+) Arthritis ,   Abdominal   Peds  Hematology  (+) Blood dyscrasia (Eliquis), ,   Anesthesia Other Findings   Reproductive/Obstetrics                           Anesthesia Physical Anesthesia Plan  ASA: 4  Anesthesia Plan: General   Post-op Pain Management: Tylenol PO (pre-op)*   Induction: Intravenous  PONV Risk Score and Plan: 1 and Midazolam, Dexamethasone and Ondansetron  Airway Management Planned: Oral ETT  Additional Equipment: Arterial line  Intra-op Plan:   Post-operative Plan: Extubation in OR  Informed Consent: I have reviewed the patients History and Physical, chart, labs and discussed the procedure including the risks, benefits and alternatives for the proposed anesthesia with the patient or authorized representative who has indicated his/her understanding and acceptance.     Dental advisory given  Plan Discussed with: CRNA  Anesthesia Plan Comments: (2 large bore PIV vs CVL if inadequate peripheral access. AICD rep to disable tachyarrhythmia function. Etomidate for induction.  PAT note written by Myra Gianotti, PA-C.  EP Perioperative ICD recommendations: Device  Type:Defibrillator Manufacturer and Phone #:[employer]: [phone] Pacemaker Dependent?:No. Date of Last Device Check:8/10/23Normal Device Function?:Yes.  Electrophysiologist's  Recommendations: ? Have magnet available. ? Provide continuous ECG monitoring when magnet is used or reprogramming is to be performed. ? Procedure will likely interfere with device function. Device should be programmed: Tachy therapies disabled     Preoperative cardiology telephonic evaluation on 08/14/22 by Nicholes Rough, PA-C.  Preoperative Cardiovascular Risk Assessment: Mr.Kosier's perioperative risk of a major cardiac event is11% according to the Revised Cardiac Risk Index (RCRI). Therefore, heis at highrisk for perioperative complications. Hisfunctional capacity is goodat 5.62METs according to the Duke Activity Status Index (DASI). Recommendations: According to ACC/AHA guidelines, no further cardiovascular testing needed. The patient may proceed to surgery at acceptable risk.    Cardiac cath 01/04/21: . LM lesion is 50% stenosed. Patent LIMA to LAD. Patent SVG to ramus. . 2nd Diag lesion is 70% stenosed. . Mid RCA to Dist RCA lesion is 100% stenosed. . Prox RCA to Mid RCA lesion is 100% stenosed. Colon Flattery RCA to Prox RCA lesion is 95% stenosed. Colon Flattery Cx lesion is 80% stenosed. This territory is not bypassed, but it is a relatively small vessel. Unable to advance balloon after successfully guiding the wire though the ramus graft and retrograde to the ostial circumflex and across the lesion. . The left ventricular ejection fraction is 25-35% by visual estimate. . There is moderate to severe left ventricular systolic dysfunction. . LV end diastolic pressure is normal. . There is no aortic valve stenosis.  Medical therapy for CAD. Unsuccessful attempt at PCI of ostial circumflex. Restart IV heparin 8 hours post sheath pull. OK to start [place] tomorrow for atrial fibrillation. He was loaded with Plavix but will not continue if he is starting a DOAC. If he is not starting a DOAC, DAPT would be reasonable for his CAD.    Echo 01/04/21: IMPRESSIONS  1. There is no  left ventricular thrombus (Definity contrast was used).  There is apical dyskinesis. There is severe inferior wall hypokinesis and  inferoseptal akinesis.. Left ventricular ejection fraction, by estimation,  is 25 to 30%. The left ventricle  has severely decreased function. The left ventricle demonstrates regional  wall motion abnormalities (see scoring diagram/findings for description).  The left ventricular internal cavity size was moderately to severely  dilated. Left ventricular diastolic  parameters are consistent with Grade I diastolic dysfunction (impaired  relaxation).  2. Right ventricular systolic function is normal. The right ventricular  size is normal. There is normal pulmonary artery systolic pressure. The  estimated right ventricular systolic pressure is 40.1 mmHg.  3. Left atrial size was severely dilated.  4. The mitral valve is normal in structure. Trivial mitral valve  regurgitation.  5. The aortic valve is tricuspid. There is mild calcification of the  aortic valve. There is mild thickening of the aortic valve. Aortic valve  regurgitation is not visualized. Mild aortic valve sclerosis is present,  with no evidence of aortic valve  stenosis.  6. Aortic dilatation noted. There is mild dilatation of the aortic root,  measuring 39 mm.  7. The inferior vena cava is normal in size with greater than 50%  respiratory variability, suggesting right atrial pressure of 3 mmHg.  - Comparison(s): Prior images unable to be directly viewed, comparison made  by report only. The left ventricular function is worsened. Multiple  previous reports from 2016 describe wall motion abnormalities in either  the right coronary artery territory or  the LAD artery  territory (although not at the same time). The apex was  previously described as akinetic. On the current study there is evidence  of prior infarction in both RCA and LAD territory with a frankly  dyskinetic apex.   )      Anesthesia Quick Evaluation

## 2022-08-18 ENCOUNTER — Inpatient Hospital Stay (HOSPITAL_COMMUNITY): Payer: Medicare Other

## 2022-08-18 ENCOUNTER — Other Ambulatory Visit: Payer: Self-pay

## 2022-08-18 ENCOUNTER — Inpatient Hospital Stay (HOSPITAL_COMMUNITY): Payer: Medicare Other | Admitting: Anesthesiology

## 2022-08-18 ENCOUNTER — Inpatient Hospital Stay (HOSPITAL_COMMUNITY): Payer: Medicare Other | Admitting: Physician Assistant

## 2022-08-18 ENCOUNTER — Encounter (HOSPITAL_COMMUNITY): Payer: Self-pay | Admitting: Vascular Surgery

## 2022-08-18 ENCOUNTER — Inpatient Hospital Stay (HOSPITAL_COMMUNITY)
Admission: RE | Admit: 2022-08-18 | Discharge: 2022-08-19 | DRG: 269 | Disposition: A | Discharge status: Home / Self Care | Payer: Medicare Other | Attending: Vascular Surgery | Admitting: Vascular Surgery

## 2022-08-18 ENCOUNTER — Encounter (HOSPITAL_COMMUNITY)
Admission: RE | Disposition: A | Discharge status: Home / Self Care | Payer: Self-pay | Source: Home / Self Care | Attending: Vascular Surgery

## 2022-08-18 DIAGNOSIS — Z955 Presence of coronary angioplasty implant and graft: Secondary | ICD-10-CM | POA: Diagnosis not present

## 2022-08-18 DIAGNOSIS — Z951 Presence of aortocoronary bypass graft: Secondary | ICD-10-CM | POA: Diagnosis not present

## 2022-08-18 DIAGNOSIS — Z79899 Other long term (current) drug therapy: Secondary | ICD-10-CM | POA: Diagnosis not present

## 2022-08-18 DIAGNOSIS — M109 Gout, unspecified: Secondary | ICD-10-CM | POA: Diagnosis present

## 2022-08-18 DIAGNOSIS — I714 Abdominal aortic aneurysm, without rupture, unspecified: Secondary | ICD-10-CM

## 2022-08-18 DIAGNOSIS — I252 Old myocardial infarction: Secondary | ICD-10-CM

## 2022-08-18 DIAGNOSIS — Z7951 Long term (current) use of inhaled steroids: Secondary | ICD-10-CM | POA: Diagnosis not present

## 2022-08-18 DIAGNOSIS — I255 Ischemic cardiomyopathy: Secondary | ICD-10-CM | POA: Diagnosis present

## 2022-08-18 DIAGNOSIS — E785 Hyperlipidemia, unspecified: Secondary | ICD-10-CM | POA: Diagnosis present

## 2022-08-18 DIAGNOSIS — J449 Chronic obstructive pulmonary disease, unspecified: Secondary | ICD-10-CM | POA: Diagnosis present

## 2022-08-18 DIAGNOSIS — I509 Heart failure, unspecified: Secondary | ICD-10-CM | POA: Diagnosis not present

## 2022-08-18 DIAGNOSIS — Z95810 Presence of automatic (implantable) cardiac defibrillator: Secondary | ICD-10-CM | POA: Diagnosis not present

## 2022-08-18 DIAGNOSIS — I2510 Atherosclerotic heart disease of native coronary artery without angina pectoris: Secondary | ICD-10-CM

## 2022-08-18 DIAGNOSIS — Z85828 Personal history of other malignant neoplasm of skin: Secondary | ICD-10-CM | POA: Diagnosis not present

## 2022-08-18 DIAGNOSIS — I110 Hypertensive heart disease with heart failure: Secondary | ICD-10-CM

## 2022-08-18 DIAGNOSIS — I5022 Chronic systolic (congestive) heart failure: Secondary | ICD-10-CM | POA: Diagnosis present

## 2022-08-18 DIAGNOSIS — Z7982 Long term (current) use of aspirin: Secondary | ICD-10-CM | POA: Diagnosis not present

## 2022-08-18 DIAGNOSIS — I739 Peripheral vascular disease, unspecified: Secondary | ICD-10-CM | POA: Diagnosis present

## 2022-08-18 DIAGNOSIS — F17210 Nicotine dependence, cigarettes, uncomplicated: Secondary | ICD-10-CM

## 2022-08-18 DIAGNOSIS — Z8249 Family history of ischemic heart disease and other diseases of the circulatory system: Secondary | ICD-10-CM | POA: Diagnosis not present

## 2022-08-18 DIAGNOSIS — Z7901 Long term (current) use of anticoagulants: Secondary | ICD-10-CM

## 2022-08-18 DIAGNOSIS — Z7984 Long term (current) use of oral hypoglycemic drugs: Secondary | ICD-10-CM

## 2022-08-18 HISTORY — PX: ULTRASOUND GUIDANCE FOR VASCULAR ACCESS: SHX6516

## 2022-08-18 HISTORY — PX: ABDOMINAL AORTIC ENDOVASCULAR STENT GRAFT: SHX5707

## 2022-08-18 LAB — GLUCOSE, CAPILLARY: Glucose-Capillary: 121 mg/dL — ABNORMAL HIGH (ref 70–99)

## 2022-08-18 LAB — CBC
HCT: 42.2 % (ref 39.0–52.0)
Hemoglobin: 14.4 g/dL (ref 13.0–17.0)
MCH: 31.1 pg (ref 26.0–34.0)
MCHC: 34.1 g/dL (ref 30.0–36.0)
MCV: 91.1 fL (ref 80.0–100.0)
Platelets: 237 10*3/uL (ref 150–400)
RBC: 4.63 MIL/uL (ref 4.22–5.81)
RDW: 15.7 % — ABNORMAL HIGH (ref 11.5–15.5)
WBC: 10.4 10*3/uL (ref 4.0–10.5)
nRBC: 0 % (ref 0.0–0.2)

## 2022-08-18 LAB — MAGNESIUM: Magnesium: 2 mg/dL (ref 1.7–2.4)

## 2022-08-18 LAB — BASIC METABOLIC PANEL
Anion gap: 7 (ref 5–15)
BUN: 28 mg/dL — ABNORMAL HIGH (ref 8–23)
CO2: 22 mmol/L (ref 22–32)
Calcium: 8.6 mg/dL — ABNORMAL LOW (ref 8.9–10.3)
Chloride: 107 mmol/L (ref 98–111)
Creatinine, Ser: 1.03 mg/dL (ref 0.61–1.24)
GFR, Estimated: >60 mL/min (ref >60)
Glucose, Bld: 106 mg/dL — ABNORMAL HIGH (ref 70–99)
Potassium: 4.4 mmol/L (ref 3.5–5.1)
Sodium: 136 mmol/L (ref 135–145)

## 2022-08-18 LAB — APTT: aPTT: 33 seconds (ref 24–36)

## 2022-08-18 LAB — POCT ACTIVATED CLOTTING TIME: Activated Clotting Time: 221 seconds

## 2022-08-18 LAB — PROTIME-INR
INR: 1.1 (ref 0.8–1.2)
Prothrombin Time: 14.5 seconds (ref 11.4–15.2)

## 2022-08-18 SURGERY — INSERTION, ENDOVASCULAR STENT GRAFT, AORTA, ABDOMINAL
Anesthesia: General | Site: Groin

## 2022-08-18 MED ORDER — HYDRALAZINE HCL 20 MG/ML IJ SOLN: 5.0000 mg | INTRAMUSCULAR | Status: DC | PRN

## 2022-08-18 MED ORDER — CHLORHEXIDINE GLUCONATE CLOTH 2 % EX PADS: 6.0000 | MEDICATED_PAD | Freq: Once | CUTANEOUS | Status: DC

## 2022-08-18 MED ORDER — SILDENAFIL CITRATE 50 MG PO TABS: 50.0000 mg | ORAL_TABLET | Freq: Every day | ORAL | Status: DC | PRN

## 2022-08-18 MED ORDER — HEPARIN SODIUM (PORCINE) 1000 UNIT/ML IJ SOLN
INTRAMUSCULAR | Status: DC | PRN
  Administered 2022-08-18: 10000 [IU] via INTRAVENOUS

## 2022-08-18 MED ORDER — OXYCODONE-ACETAMINOPHEN 5-325 MG PO TABS: 1.0000 | ORAL_TABLET | ORAL | Status: DC | PRN

## 2022-08-18 MED ORDER — BISACODYL 10 MG RE SUPP
10.0000 mg | Freq: Every day | RECTAL | Status: DC | PRN
  Filled 2022-08-18: qty 1

## 2022-08-18 MED ORDER — GUAIFENESIN-DM 100-10 MG/5ML PO SYRP
15.0000 mL | ORAL_SOLUTION | ORAL | Status: DC | PRN
  Filled 2022-08-18: qty 15

## 2022-08-18 MED ORDER — ACETAMINOPHEN 325 MG PO TABS: 325.0000 mg | ORAL_TABLET | ORAL | Status: DC | PRN

## 2022-08-18 MED ORDER — COLCHICINE 0.6 MG PO TABS
0.6000 mg | ORAL_TABLET | Freq: Every day | ORAL | Status: DC | PRN
  Filled 2022-08-18: qty 2

## 2022-08-18 MED ORDER — MAGNESIUM OXIDE -MG SUPPLEMENT 400 (240 MG) MG PO TABS
200.0000 mg | ORAL_TABLET | Freq: Every day | ORAL | Status: DC
  Administered 2022-08-18 – 2022-08-19 (×2): 200 mg via ORAL
  Filled 2022-08-18: qty 0.5
  Filled 2022-08-18: qty 1

## 2022-08-18 MED ORDER — ALUM & MAG HYDROXIDE-SIMETH 200-200-20 MG/5ML PO SUSP
15.0000 mL | ORAL | Status: DC | PRN
  Administered 2022-08-19: 30 mL via ORAL
  Filled 2022-08-18 (×2): qty 30

## 2022-08-18 MED ORDER — HYDROMORPHONE HCL 1 MG/ML IJ SOLN: 0.5000 mg | INTRAMUSCULAR | Status: DC | PRN

## 2022-08-18 MED ORDER — ISOSORBIDE MONONITRATE ER 30 MG PO TB24
30.0000 mg | ORAL_TABLET | Freq: Every day | ORAL | Status: DC
  Administered 2022-08-18 – 2022-08-19 (×2): 30 mg via ORAL
  Filled 2022-08-18 (×3): qty 1

## 2022-08-18 MED ORDER — ACETAMINOPHEN 325 MG RE SUPP: 325.0000 mg | RECTAL | Status: DC | PRN

## 2022-08-18 MED ORDER — MOMETASONE FURO-FORMOTEROL FUM 200-5 MCG/ACT IN AERO
2.0000 | INHALATION_SPRAY | Freq: Two times a day (BID) | RESPIRATORY_TRACT | Status: DC
  Administered 2022-08-18: 2 via RESPIRATORY_TRACT
  Filled 2022-08-18: qty 8.8

## 2022-08-18 MED ORDER — INSULIN ASPART 100 UNIT/ML IJ SOLN: 0.0000 [IU] | Freq: Three times a day (TID) | INTRAMUSCULAR | Status: DC

## 2022-08-18 MED ORDER — NITROGLYCERIN 0.4 MG SL SUBL
0.4000 mg | SUBLINGUAL_TABLET | SUBLINGUAL | Status: DC | PRN
  Filled 2022-08-18: qty 25

## 2022-08-18 MED ORDER — IODIXANOL 320 MG/ML IV SOLN
INTRAVENOUS | Status: DC | PRN
  Administered 2022-08-18: 70 mL

## 2022-08-18 MED ORDER — PROPOFOL 10 MG/ML IV BOLUS
INTRAVENOUS | Status: AC
  Filled 2022-08-18: qty 20

## 2022-08-18 MED ORDER — ACETAMINOPHEN 500 MG PO TABS
1000.0000 mg | ORAL_TABLET | Freq: Once | ORAL | Status: AC
  Administered 2022-08-18: 1000 mg via ORAL
  Filled 2022-08-18: qty 2

## 2022-08-18 MED ORDER — ROCURONIUM BROMIDE 10 MG/ML (PF) SYRINGE
PREFILLED_SYRINGE | INTRAVENOUS | Status: AC
  Filled 2022-08-18: qty 10

## 2022-08-18 MED ORDER — CARVEDILOL 6.25 MG PO TABS
6.2500 mg | ORAL_TABLET | Freq: Two times a day (BID) | ORAL | Status: DC
  Administered 2022-08-18 – 2022-08-19 (×2): 6.25 mg via ORAL
  Filled 2022-08-18 (×3): qty 1

## 2022-08-18 MED ORDER — LIDOCAINE 2% (20 MG/ML) 5 ML SYRINGE
INTRAMUSCULAR | Status: DC | PRN
  Administered 2022-08-18: 100 mg via INTRAVENOUS

## 2022-08-18 MED ORDER — DOCUSATE SODIUM 100 MG PO CAPS
100.0000 mg | ORAL_CAPSULE | Freq: Every day | ORAL | Status: DC
  Administered 2022-08-19: 100 mg via ORAL
  Filled 2022-08-18 (×2): qty 1

## 2022-08-18 MED ORDER — SODIUM CHLORIDE 0.9 % IV SOLN: INTRAVENOUS | Status: DC

## 2022-08-18 MED ORDER — TIOTROPIUM BROMIDE MONOHYDRATE 18 MCG IN CAPS: 18.0000 ug | ORAL_CAPSULE | Freq: Every day | RESPIRATORY_TRACT | Status: DC

## 2022-08-18 MED ORDER — EPHEDRINE SULFATE-NACL 50-0.9 MG/10ML-% IV SOSY
PREFILLED_SYRINGE | INTRAVENOUS | Status: DC | PRN
  Administered 2022-08-18: 5 mg via INTRAVENOUS

## 2022-08-18 MED ORDER — PHENYLEPHRINE 80 MCG/ML (10ML) SYRINGE FOR IV PUSH (FOR BLOOD PRESSURE SUPPORT)
PREFILLED_SYRINGE | INTRAVENOUS | Status: DC | PRN
  Administered 2022-08-18 (×2): 80 ug via INTRAVENOUS
  Administered 2022-08-18: 120 ug via INTRAVENOUS

## 2022-08-18 MED ORDER — POLYETHYLENE GLYCOL 3350 17 G PO PACK
17.0000 g | PACK | Freq: Every day | ORAL | Status: DC | PRN
  Filled 2022-08-18: qty 1

## 2022-08-18 MED ORDER — PROTAMINE SULFATE 10 MG/ML IV SOLN
INTRAVENOUS | Status: AC
  Filled 2022-08-18: qty 5

## 2022-08-18 MED ORDER — ONDANSETRON HCL 4 MG/2ML IJ SOLN
INTRAMUSCULAR | Status: AC
  Filled 2022-08-18: qty 2

## 2022-08-18 MED ORDER — ONDANSETRON HCL 4 MG/2ML IJ SOLN: 4.0000 mg | Freq: Once | INTRAMUSCULAR | Status: DC | PRN

## 2022-08-18 MED ORDER — EMPAGLIFLOZIN 10 MG PO TABS
10.0000 mg | ORAL_TABLET | Freq: Every day | ORAL | Status: DC
  Administered 2022-08-18: 10 mg via ORAL
  Filled 2022-08-18 (×3): qty 1

## 2022-08-18 MED ORDER — PROPOFOL 10 MG/ML IV BOLUS
INTRAVENOUS | Status: DC | PRN
  Administered 2022-08-18 (×2): 50 mg via INTRAVENOUS
  Administered 2022-08-18: 20 mg via INTRAVENOUS

## 2022-08-18 MED ORDER — HEPARIN 6000 UNIT IRRIGATION SOLUTION
Status: AC
  Filled 2022-08-18: qty 500

## 2022-08-18 MED ORDER — DEXAMETHASONE SODIUM PHOSPHATE 10 MG/ML IJ SOLN
INTRAMUSCULAR | Status: AC
  Filled 2022-08-18: qty 1

## 2022-08-18 MED ORDER — CHLORHEXIDINE GLUCONATE 0.12 % MT SOLN
15.0000 mL | Freq: Once | OROMUCOSAL | Status: AC
  Administered 2022-08-18: 15 mL via OROMUCOSAL
  Filled 2022-08-18: qty 15

## 2022-08-18 MED ORDER — ONDANSETRON HCL 4 MG/2ML IJ SOLN: 4.0000 mg | Freq: Four times a day (QID) | INTRAMUSCULAR | Status: DC | PRN

## 2022-08-18 MED ORDER — PHENOL 1.4 % MT LIQD
1.0000 | OROMUCOSAL | Status: DC | PRN
  Filled 2022-08-18: qty 177

## 2022-08-18 MED ORDER — DEXAMETHASONE SODIUM PHOSPHATE 10 MG/ML IJ SOLN
INTRAMUSCULAR | Status: DC | PRN
  Administered 2022-08-18: 10 mg via INTRAVENOUS

## 2022-08-18 MED ORDER — TAMSULOSIN HCL 0.4 MG PO CAPS
0.4000 mg | ORAL_CAPSULE | Freq: Every day | ORAL | Status: DC
  Administered 2022-08-18 – 2022-08-19 (×2): 0.4 mg via ORAL
  Filled 2022-08-18 (×2): qty 1

## 2022-08-18 MED ORDER — PANTOPRAZOLE SODIUM 40 MG PO TBEC
40.0000 mg | DELAYED_RELEASE_TABLET | Freq: Every day | ORAL | Status: DC
  Administered 2022-08-18 – 2022-08-19 (×2): 40 mg via ORAL
  Filled 2022-08-18 (×2): qty 1

## 2022-08-18 MED ORDER — APIXABAN 5 MG PO TABS
5.0000 mg | ORAL_TABLET | Freq: Two times a day (BID) | ORAL | Status: DC
  Filled 2022-08-18: qty 1

## 2022-08-18 MED ORDER — FENTANYL CITRATE (PF) 250 MCG/5ML IJ SOLN
INTRAMUSCULAR | Status: DC | PRN
  Administered 2022-08-18: 100 ug via INTRAVENOUS
  Administered 2022-08-18: 50 ug via INTRAVENOUS

## 2022-08-18 MED ORDER — POTASSIUM CHLORIDE CRYS ER 20 MEQ PO TBCR: 20.0000 meq | EXTENDED_RELEASE_TABLET | Freq: Every day | ORAL | Status: DC | PRN

## 2022-08-18 MED ORDER — SPIRONOLACTONE 25 MG PO TABS
25.0000 mg | ORAL_TABLET | ORAL | Status: DC
  Filled 2022-08-18: qty 1

## 2022-08-18 MED ORDER — PROTAMINE SULFATE 10 MG/ML IV SOLN
INTRAVENOUS | Status: DC | PRN
  Administered 2022-08-18 (×5): 10 mg via INTRAVENOUS

## 2022-08-18 MED ORDER — PHENYLEPHRINE HCL-NACL 20-0.9 MG/250ML-% IV SOLN
INTRAVENOUS | Status: DC | PRN
  Administered 2022-08-18: 50 ug/min via INTRAVENOUS

## 2022-08-18 MED ORDER — OXYCODONE-ACETAMINOPHEN 5-325 MG PO TABS
1.0000 | ORAL_TABLET | Freq: Four times a day (QID) | ORAL | Status: DC | PRN
  Administered 2022-08-18 – 2022-08-19 (×2): 2 via ORAL
  Filled 2022-08-18 (×2): qty 2

## 2022-08-18 MED ORDER — CEFAZOLIN SODIUM-DEXTROSE 2-4 GM/100ML-% IV SOLN
2.0000 g | INTRAVENOUS | Status: AC
  Administered 2022-08-18: 2 g via INTRAVENOUS
  Filled 2022-08-18: qty 100

## 2022-08-18 MED ORDER — FUROSEMIDE 40 MG PO TABS
40.0000 mg | ORAL_TABLET | Freq: Every day | ORAL | Status: DC
  Administered 2022-08-18 – 2022-08-19 (×2): 40 mg via ORAL
  Filled 2022-08-18 (×3): qty 1

## 2022-08-18 MED ORDER — SODIUM CHLORIDE 0.9 % IV SOLN: 500.0000 mL | Freq: Once | INTRAVENOUS | Status: DC | PRN

## 2022-08-18 MED ORDER — HEPARIN 6000 UNIT IRRIGATION SOLUTION
Status: DC | PRN
  Administered 2022-08-18: 1

## 2022-08-18 MED ORDER — HEPARIN SODIUM (PORCINE) 1000 UNIT/ML IJ SOLN
INTRAMUSCULAR | Status: AC
  Filled 2022-08-18: qty 10

## 2022-08-18 MED ORDER — SACUBITRIL-VALSARTAN 24-26 MG PO TABS
1.0000 | ORAL_TABLET | Freq: Two times a day (BID) | ORAL | Status: DC
  Administered 2022-08-18 – 2022-08-19 (×3): 1 via ORAL
  Filled 2022-08-18 (×4): qty 1

## 2022-08-18 MED ORDER — TRAZODONE 25 MG HALF TABLET
25.0000 mg | ORAL_TABLET | Freq: Every evening | ORAL | Status: DC | PRN
  Filled 2022-08-18 (×2): qty 2

## 2022-08-18 MED ORDER — ATORVASTATIN CALCIUM 80 MG PO TABS
80.0000 mg | ORAL_TABLET | Freq: Every day | ORAL | Status: DC
  Administered 2022-08-18 – 2022-08-19 (×2): 80 mg via ORAL
  Filled 2022-08-18 (×3): qty 1

## 2022-08-18 MED ORDER — LACTATED RINGERS IV SOLN: INTRAVENOUS | Status: DC

## 2022-08-18 MED ORDER — FENTANYL CITRATE (PF) 100 MCG/2ML IJ SOLN: 25.0000 ug | INTRAMUSCULAR | Status: DC | PRN

## 2022-08-18 MED ORDER — SUGAMMADEX SODIUM 200 MG/2ML IV SOLN
INTRAVENOUS | Status: DC | PRN
  Administered 2022-08-18: 300 mg via INTRAVENOUS

## 2022-08-18 MED ORDER — CEFAZOLIN SODIUM-DEXTROSE 2-4 GM/100ML-% IV SOLN
2.0000 g | Freq: Three times a day (TID) | INTRAVENOUS | Status: AC
  Administered 2022-08-18 – 2022-08-19 (×2): 2 g via INTRAVENOUS
  Filled 2022-08-18 (×3): qty 100

## 2022-08-18 MED ORDER — UMECLIDINIUM BROMIDE 62.5 MCG/ACT IN AEPB
1.0000 | INHALATION_SPRAY | Freq: Every day | RESPIRATORY_TRACT | Status: DC
  Filled 2022-08-18: qty 7

## 2022-08-18 MED ORDER — LABETALOL HCL 5 MG/ML IV SOLN: 10.0000 mg | INTRAVENOUS | Status: DC | PRN

## 2022-08-18 MED ORDER — MAGNESIUM SULFATE 2 GM/50ML IV SOLN: 2.0000 g | Freq: Every day | INTRAVENOUS | Status: DC | PRN

## 2022-08-18 MED ORDER — ROCURONIUM BROMIDE 10 MG/ML (PF) SYRINGE
PREFILLED_SYRINGE | INTRAVENOUS | Status: DC | PRN
  Administered 2022-08-18: 100 mg via INTRAVENOUS

## 2022-08-18 MED ORDER — MIDAZOLAM HCL 2 MG/2ML IJ SOLN
INTRAMUSCULAR | Status: DC | PRN
  Administered 2022-08-18: 2 mg via INTRAVENOUS

## 2022-08-18 MED ORDER — MIDAZOLAM HCL 2 MG/2ML IJ SOLN
INTRAMUSCULAR | Status: AC
  Filled 2022-08-18: qty 2

## 2022-08-18 MED ORDER — FENTANYL CITRATE (PF) 250 MCG/5ML IJ SOLN
INTRAMUSCULAR | Status: AC
  Filled 2022-08-18: qty 5

## 2022-08-18 MED ORDER — EPHEDRINE 5 MG/ML INJ
INTRAVENOUS | Status: AC
  Filled 2022-08-18: qty 5

## 2022-08-18 MED ORDER — ORAL CARE MOUTH RINSE: 15.0000 mL | Freq: Once | OROMUCOSAL | Status: AC

## 2022-08-18 MED ORDER — PHENYLEPHRINE 80 MCG/ML (10ML) SYRINGE FOR IV PUSH (FOR BLOOD PRESSURE SUPPORT)
PREFILLED_SYRINGE | INTRAVENOUS | Status: AC
  Filled 2022-08-18: qty 10

## 2022-08-18 MED ORDER — ONDANSETRON HCL 4 MG/2ML IJ SOLN
INTRAMUSCULAR | Status: DC | PRN
  Administered 2022-08-18: 4 mg via INTRAVENOUS

## 2022-08-18 MED ORDER — METOPROLOL TARTRATE 5 MG/5ML IV SOLN: 2.0000 mg | INTRAVENOUS | Status: DC | PRN

## 2022-08-18 MED ORDER — EZETIMIBE 10 MG PO TABS
10.0000 mg | ORAL_TABLET | Freq: Every day | ORAL | Status: DC
  Administered 2022-08-18 – 2022-08-19 (×2): 10 mg via ORAL
  Filled 2022-08-18 (×3): qty 1

## 2022-08-18 MED ORDER — ASPIRIN 81 MG PO TBEC
81.0000 mg | DELAYED_RELEASE_TABLET | Freq: Every day | ORAL | Status: DC
  Administered 2022-08-19: 81 mg via ORAL
  Filled 2022-08-18 (×2): qty 1

## 2022-08-18 SURGICAL SUPPLY — 54 items
ADH SKN CLS APL DERMABOND .7 (GAUZE/BANDAGES/DRESSINGS) ×4
BAG COUNTER SPONGE SURGICOUNT (BAG) ×3 IMPLANT
BAG SPNG CNTER NS LX DISP (BAG) ×2
BLADE CLIPPER SURG (BLADE) ×3 IMPLANT
CANISTER SUCT 3000ML PPV (MISCELLANEOUS) ×3 IMPLANT
CATH BEACON 5.038 65CM KMP-01 (CATHETERS) ×3 IMPLANT
CATH OMNI FLUSH .035X70CM (CATHETERS) ×3 IMPLANT
DERMABOND ADVANCED .7 DNX12 (GAUZE/BANDAGES/DRESSINGS) ×3 IMPLANT
DEVICE CLOSURE PERCLS PRGLD 6F (VASCULAR PRODUCTS) ×12 IMPLANT
DRSG TEGADERM 2-3/8X2-3/4 SM (GAUZE/BANDAGES/DRESSINGS) ×6 IMPLANT
DRYSEAL FLEXSHEATH 12FR 33CM (SHEATH) ×2
DRYSEAL FLEXSHEATH 16FR 33CM (SHEATH) ×2
ELECT REM PT RETURN 9FT ADLT (ELECTROSURGICAL) ×4
ELECTRODE REM PT RTRN 9FT ADLT (ELECTROSURGICAL) ×6 IMPLANT
EXCLDR TRNK ENDO 26X14.5X12 16 (Endovascular Graft) ×2 IMPLANT
EXCLUDER TNK END 26X14.5X12 16 (Endovascular Graft) IMPLANT
GAUZE 4X4 16PLY ~~LOC~~+RFID DBL (SPONGE) ×3 IMPLANT
GAUZE SPONGE 2X2 8PLY STRL LF (GAUZE/BANDAGES/DRESSINGS) ×6 IMPLANT
GLIDEWIRE ADV .035X180CM (WIRE) ×3 IMPLANT
GLOVE BIO SURGEON STRL SZ7.5 (GLOVE) ×3 IMPLANT
GOWN STRL REUS W/ TWL LRG LVL3 (GOWN DISPOSABLE) ×6 IMPLANT
GOWN STRL REUS W/ TWL XL LVL3 (GOWN DISPOSABLE) ×6 IMPLANT
GOWN STRL REUS W/TWL LRG LVL3 (GOWN DISPOSABLE) ×4
GOWN STRL REUS W/TWL XL LVL3 (GOWN DISPOSABLE) ×4
GRAFT BALLN CATH 65CM (STENTS) ×3 IMPLANT
KIT BASIN OR (CUSTOM PROCEDURE TRAY) ×3 IMPLANT
KIT DRAIN CSF ACCUDRAIN (MISCELLANEOUS) IMPLANT
KIT TURNOVER KIT B (KITS) ×3 IMPLANT
LEG CONTRALATERAL 16X20X13.5 (Vascular Products) ×2 IMPLANT
LEG CONTRALETERAL16X16X11.5 (Endovascular Graft) ×2 IMPLANT
NS IRRIG 1000ML POUR BTL (IV SOLUTION) ×3 IMPLANT
PACK ENDOVASCULAR (PACKS) ×3 IMPLANT
PAD ARMBOARD 7.5X6 YLW CONV (MISCELLANEOUS) ×6 IMPLANT
PERCLOSE PROGLIDE 6F (VASCULAR PRODUCTS) ×8
SET MICROPUNCTURE 5F STIFF (MISCELLANEOUS) ×3 IMPLANT
SHEATH BRITE TIP 8FR 23CM (SHEATH) ×3 IMPLANT
SHEATH DRYSEAL FLEX 12FR 33CM (SHEATH) IMPLANT
SHEATH DRYSEAL FLEX 16FR 33CM (SHEATH) IMPLANT
SHEATH PINNACLE 8F 10CM (SHEATH) ×3 IMPLANT
SPONGE T-LAP 18X18 ~~LOC~~+RFID (SPONGE) ×6 IMPLANT
STENT GRAFT BALLN CATH 65CM (STENTS) ×2
STENT GRAFT CONTRALAT 16X11.5 (Endovascular Graft) IMPLANT
STENT GRAFT CONTRALAT 20X13.5 (Vascular Products) IMPLANT
STOPCOCK MORSE 400PSI 3WAY (MISCELLANEOUS) ×3 IMPLANT
SUT MNCRL AB 4-0 PS2 18 (SUTURE) ×6 IMPLANT
SUT VIC AB 3-0 SH 27 (SUTURE)
SUT VIC AB 3-0 SH 27X BRD (SUTURE) IMPLANT
SYR 20ML LL LF (SYRINGE) ×3 IMPLANT
SYR MEDRAD MARK V 150ML (SYRINGE) IMPLANT
TOWEL GREEN STERILE (TOWEL DISPOSABLE) ×3 IMPLANT
TRAY FOLEY MTR SLVR 16FR STAT (SET/KITS/TRAYS/PACK) ×3 IMPLANT
TUBING INJECTOR 48 (MISCELLANEOUS) ×3 IMPLANT
WIRE AMPLATZ SS-J .035X180CM (WIRE) ×3 IMPLANT
WIRE BENTSON .035X145CM (WIRE) ×6 IMPLANT

## 2022-08-18 NOTE — Progress Notes (Signed)
[facility]  Subjective  - No issues, comfortable    Objective 110/74 77 97.8 F (36.6 C) (!) 24 90%  Intake/Output Summary (Last 24 hours) at 08/18/2022 1532 Last data filed at 08/18/2022 1200 Gross per 24 hour  Intake 800 ml  Output 405 ml  Net 395 ml    Palpable DP pulse B LE Groins soft without hematoma Lungs non labored breathing  Assessment/Planning: POD # 0 EVAR by Dr. Donzetta Matters for large and abdominal aortic aneurysm now greater than 5.5 cm indicated for endovascular repair.  Stable post op disposition anticipate discharge tomorrow.    Roxy Horseman 08/18/2022 3:32 PM --  Laboratory Lab Results: Recent Labs    08/18/22 0945  WBC 10.4  HGB 14.4  HCT 42.2  PLT 237   BMET Recent Labs    08/18/22 0945  NA 136  K 4.4  CL 107  CO2 22  GLUCOSE 106*  BUN 28*  CREATININE 1.03  CALCIUM 8.6*    COAG Lab Results  Component Value Date   INR 1.1 08/18/2022   INR 1.3 (H) 08/10/2022   INR 1.1 01/04/2021   No results found for: "PTT"

## 2022-08-18 NOTE — Op Note (Signed)
Patient name: Kerry Mason MRN: [id number] DOB: [birth year] Sex: male  08/18/2022 Pre-operative Diagnosis: AAA Post-operative diagnosis:  Same Surgeon:  Erlene Quan C. Donzetta Matters, MD Assistant: Luisa Dago, [place] Procedure Performed:  Endovascular aortic aneurysm repair with main body right 26 x 14 x 12 extended with 16 x 12 via 16 French sheath and contralateral limb left 20 x 14 via 12 [place] sheath ironed out with M OB balloon.  Indications: 66 year old male with large and abdominal aortic aneurysm now greater than 5.5 cm indicated for endovascular repair.  Findings: There was a very long neck distal to the renal arteries.  The graft was landed just at the level of the renal arteries at the level of the hypogastric arteries distally.  At completion there were no endoleak's identified and there were palpable dorsalis pedis pulses.   Procedure:  The patient was identified in the holding area and taken to the operating room where is placed upon operative when general anesthesia was induced.  He was sterilely prepped and draped in the bilateral groins and abdomen the usual fashion, antibiotics were ministered a timeout was called.  We began with ultrasound-guided cannulation of first the left common femoral artery using a puncture needle followed by wire and the sheath  my.  We then placed a Bentson wire dilated with an 8 French sheath deployed to percutaneous closure devices and then placed a short 8 [place] sheath.  On the right side we used ultrasound guidance to cannulate the right common femoral artery in a similar fashion with a micropuncture needle followed by wire and sheath placed a Bentson wire dilated this with 8 [place] dilator placed 2 percutaneous closure devices and a longer 8 French sheath was placed.  We then exchanged on both sides for stiff wires on the left side we placed a 12 French sheath patient was heparinized on the right side we placed a 16 French sheath.  ACT returned to 220.  The main  body was then brought to the mid L2 and aortogram was performed from the left side via pigtail.  The main body was then deployed.  We cannulated the gate from the left side and we are able to twist a pigtail catheter and then confirmed we are below the renal arteries with angiogram.  We also identified the left hypogastric artery and then extended on the left with 20 x 14 via the 12 French sheath.  On the right side we finished two-point the device performed retrograde angiography identified the hypogastric artery with an LAO projection and then deployed a 16 x 12 extension.  The entire graft was ironed out with MLB balloon.  Completion demonstrated no endoleak's and we were just below the renal arteries and just above the bilateral hypogastric arteries.  There was some concern of the distal right limb perform retrograde angiography distal left appropriate.  We then exchanged for Bentson wires bilaterally deployed our percutaneous closure devices.  There was strong signals bilaterally dorsalis pedis arteries.  We administered 50 mg of protamine.  Patient was then awakened from anesthesia having tolerated procedure without immediate complication.  All counts were correct at completion.  Given the complexity of the case,  the assistant was necessary in order to expedient the procedure and safely perform the technical aspects of the operation.  Specifically the assistant facilitated wire and catheter and sheath exchange and this could not have been performed by a scrub tech.   EBL: 100cc  Contrast: 70cc  Agness Sibrian C. Donzetta Matters, MD [facility] Office: [phone] Pager: [phone]

## 2022-08-18 NOTE — Anesthesia Postprocedure Evaluation (Signed)
Anesthesia Post Note  Patient: Kerry Mason  Procedure(s) Performed: ABDOMINAL AORTIC ENDOVASCULAR STENT GRAFT (Abdomen) ULTRASOUND GUIDANCE FOR VASCULAR ACCESS, BILATERAL FEMORAL ARTERIES (Bilateral: Groin)     Patient location during evaluation: PACU Anesthesia Type: General Level of consciousness: awake and alert Pain management: pain level controlled Vital Signs Assessment: post-procedure vital signs reviewed and stable Respiratory status: spontaneous breathing, nonlabored ventilation, respiratory function stable and patient connected to nasal cannula oxygen Cardiovascular status: blood pressure returned to baseline and stable Postop Assessment: no apparent nausea or vomiting Anesthetic complications: no   No notable events documented.  Last Vitals:  Vitals:   08/18/22 1600 08/18/22 1630  BP: 104/69 108/79  Pulse: 71 73  Resp: (!) 21 20  Temp:    SpO2: 92% 92%    Last Pain:  Vitals:   08/18/22 1600  TempSrc:   PainSc: 0-No pain                 Santa Lighter

## 2022-08-18 NOTE — H&P (Signed)
HPI:   Kerry Mason is a 66 y.o. male history of abdominal aortic aneurysm recently underwent ultrasound which demonstrated increased growth.  He now follows up with CT scan.  No new back or abdominal pain.  Does have extensive history of coronary artery disease and a previous cardiac tamponade following attempted intervention several years ago.  He walks with out limitations does have some shortness of breath but is not followed by pulmonology.  Patient lives alone, he does take Eliquis as part of his regimen for heart failure.       Past Medical History:  Diagnosis Date   Acute systolic CHF (congestive heart failure) (HCC)     AICD (automatic cardioverter/defibrillator) present     Anxiety     Ascending aortic aneurysm ([place]) 08/31/2017   Back pain 08/31/2017   Basal cell carcinoma      left arm   CAD (coronary artery disease)      a. 2000: s/p stent of RCA 2000 with BMS  b. 2015: STEMI s/p LHC with old occlusion of RCA and DESx2 to LAD  c. 02/04/18: complicated PCI on 3/2 for CTO of mid RCA with coronary perforation and cardiac tamponade requiring emergent pericardiocentesis      Cardiac tamponade      a. 02/03/15 2/2 coronary perforation during CTO procedure. Sealed with graftmaster coated stent.    COPD (chronic obstructive pulmonary disease) ([place]) 08/31/2017   Gout     History of pneumonia     HTN (hypertension)     Hyperlipidemia     Ischemic cardiomyopathy      a. 2D ECHO: EF 25-30%. 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    LIMA to LAD, SVG to ramus intermediate branch, EVH via right thigh    Shortness of breath dyspnea     Tobacco abuse     Urinary frequency           Family History  Problem Relation Age of Onset   CAD Father          PTCA   Cancer Mother          LYMPHOMA         Past Surgical History:  Procedure Laterality Date   CARDIAC CATHETERIZATION        CARDIAC CATHETERIZATION N/A 06/14/2015    Procedure: Left Heart Cath and Coronary Angiography;  Surgeon: Lorretta Harp, MD;  Location: [facility];  Service: Cardiovascular;  Laterality: N/A;   CARDIAC CATHETERIZATION N/A 08/18/2015    Procedure: Intravascular Pressure Wire/FFR Study;  Surgeon: Peter M Martinique, MD;  Location: [facility] [facility];  Service: Cardiovascular;  Laterality: N/A;   COLONOSCOPY       COLONOSCOPY WITH PROPOFOL N/A 03/09/2022    Procedure: COLONOSCOPY WITH PROPOFOL;  Surgeon: Yetta Flock, MD;  Location: [facility];  Service: Gastroenterology;  Laterality: N/A;   CORONARY ANGIOPLASTY   2000   CORONARY ANGIOPLASTY WITH STENT PLACEMENT   2015   CORONARY ARTERY BYPASS GRAFT N/A 09/02/2015    Procedure: CORONARY ARTERY BYPASS GRAFTING (CABG) x 2 (LIMA-LAD, SVG-Intermediate) ENDOSCOPIC GREATER SAPHENOUS VEIN HARVEST RIGHT THIGH;  Surgeon: Rexene Alberts, MD;  Location: [facility];  Service: Open Heart Surgery;  Laterality: N/A;   CORONARY BALLOON ANGIOPLASTY N/A 01/04/2021    Procedure: CORONARY BALLOON ANGIOPLASTY;  Surgeon:  Jettie Booze, MD;  Location: [facility];  Service: Cardiovascular;  Laterality: N/A;   FOOT SURGERY       IMPLANTABLE CARDIOVERTER DEFIBRILLATOR IMPLANT N/A 04/17/2014    Procedure: IMPLANTABLE CARDIOVERTER DEFIBRILLATOR IMPLANT;  Surgeon: Evans Lance, MD;  Location: [facility] C[facility] LAB;  Service: Cardiovascular;  Laterality: N/A;   LEFT HEART CATH AND CORONARY ANGIOGRAPHY N/A 01/04/2021    Procedure: LEFT HEART CATH AND CORONARY ANGIOGRAPHY;  Surgeon: Jettie Booze, MD;  Location: [place] [facility];  Service: Cardiovascular;  Laterality: N/A;   LEFT HEART CATH AND CORS/GRAFTS ANGIOGRAPHY N/A 08/30/2017    Procedure: LEFT HEART CATH AND CORS/GRAFTS ANGIOGRAPHY;  Surgeon: Nelva Bush, MD;  Location: [place] [facility];  Service: Cardiovascular;  Laterality: N/A;   LEFT HEART CATHETERIZATION WITH CORONARY ANGIOGRAM N/A 12/11/2013     Procedure: LEFT HEART CATHETERIZATION WITH CORONARY ANGIOGRAM;  Surgeon: Peter M Martinique, MD;  Location: [facility] CATH LAB;  Service: Cardiovascular;  Laterality: N/A;   LEFT HEART CATHETERIZATION WITH CORONARY ANGIOGRAM N/A 01/05/2015    Procedure: LEFT HEART CATHETERIZATION WITH CORONARY ANGIOGRAM;  Surgeon: Peter M Martinique, MD;  Location: [facility] CATH LAB;  Service: Cardiovascular;  Laterality: N/A;   PERCUTANEOUS CORONARY STENT INTERVENTION (PCI-S)   12/11/2013    Procedure: PERCUTANEOUS CORONARY STENT INTERVENTION (PCI-S);  Surgeon: Peter M Martinique, MD;  Location: [facility] CATH LAB;  Service: Cardiovascular;;  prov LAD and mid LAD   PERCUTANEOUS CORONARY STENT INTERVENTION (PCI-S) N/A 02/03/2015    Procedure: PERCUTANEOUS CORONARY STENT INTERVENTION (PCI-S);  Surgeon: Jettie Booze, MD;  Location: [facility] CATH LAB;  Service: Cardiovascular;  Laterality: N/A;   POLYPECTOMY   03/09/2022    Procedure: POLYPECTOMY;  Surgeon: Yetta Flock, MD;  Location: [facility];  Service: Gastroenterology;;   SPERMATOCELECTOMY Right 02/21/2022    Procedure: RIGHT SPERMATOCELECTOMY;  Surgeon: Irine Seal, MD;  Location: [facility];  Service: Urology;  Laterality: Right;   TEE WITHOUT CARDIOVERSION N/A 09/02/2015    Procedure: TRANSESOPHAGEAL ECHOCARDIOGRAM (TEE);  Surgeon: Rexene Alberts, MD;  Location: [place];  Service: Open Heart Surgery;  Laterality: N/A;      Short Social History:  Social History         Tobacco Use   Smoking status: Every Day      Packs/day: 0.50      Years: 35.00      Total pack years: 17.50      Types: Cigarettes      Passive exposure: Never   Smokeless tobacco: Never  Substance Use Topics   Alcohol use: No      Alcohol/week: 0.0 standard drinks of alcohol      No Known Allergies         Current Outpatient Medications  Medication Sig Dispense Refill   acetaminophen (TYLENOL) 325 MG tablet Take 2 tablets (650 mg total) by mouth every 4 (four) hours as needed for headache or mild pain.        allopurinol (ZYLOPRIM) 300 MG tablet Take 1 tablet (300 mg total) by mouth 2 (two) times daily. 180 tablet 3   apixaban (ELIQUIS) 5 MG TABS tablet Take 1 tablet (5 mg total) by mouth 2 (two) times daily. 60 tablet 11   APPLE CIDER VINEGAR PO Take 1 capsule by mouth daily.       Ascorbic Acid (VITAMIN C) 1000 MG tablet Take 1,000 mg by mouth daily.       aspirin EC 81 MG tablet Take 81 mg by mouth  daily.       budesonide-formoterol (SYMBICORT) 160-4.5 MCG/ACT inhaler Inhale 2 puffs into the lungs 2 (two) times daily. 1 each 6   buPROPion (WELLBUTRIN XL) 150 MG 24 hr tablet TAKE 1 TABLET BY MOUTH EVERY DAY 90 tablet 0   Calcium Carbonate-Vit D-Min (CALCIUM 1200 PO) Take 1 capsule by mouth daily.       carvedilol (COREG) 6.25 MG tablet Take 1 tablet (6.25 mg total) by mouth 2 (two) times daily. 180 tablet 3   cholecalciferol (VITAMIN D3) 25 MCG (1000 UNIT) tablet Take 1,000 Units by mouth daily.       colchicine 0.6 MG tablet Take 2 tablet po one time.  Then take 1 tablet after one hour. Then take 1 tablet daily for 5 days. 40 tablet 2   empagliflozin (JARDIANCE) 10 MG TABS tablet Take 1 tablet (10 mg total) by mouth daily. 90 tablet 1   ezetimibe (ZETIA) 10 MG tablet Take 1 tablet (10 mg total) by mouth daily. 90 tablet 3   furosemide (LASIX) 40 MG tablet TAKE 1 TABLET BY MOUTH EVERY DAY 90 tablet 1   isosorbide mononitrate (IMDUR) 30 MG 24 hr tablet Take 1 tablet (30 mg total) by mouth daily. 90 tablet 3   Magnesium Oxide 200 MG TABS Take 1 tablet (200 mg total) by mouth 2 (two) times daily. (Patient taking differently: Take 200 mg by mouth daily.) 60 tablet 0   Multiple Vitamin (MULTIVITAMIN) tablet Take 1 tablet by mouth daily.       nicotine (NICODERM CQ - DOSED IN MG/24 HOURS) 21 mg/24hr patch Place 1 patch (21 mg total) onto the skin daily. 28 patch 2   nicotine polacrilex (COMMIT) 4 MG lozenge Take 1 lozenge (4 mg total) by mouth as needed for smoking cessation. 100 tablet 2    nitroGLYCERIN (NITROSTAT) 0.4 MG SL tablet DISSOLVE 1 TABLET UNDER KTHE TONGUE EVERY 5 MINUTES AS NEEDED FOR CHEST PAIN. MAX 3 DOSES/15 MIN 25 tablet 2   OMEGA-3 FATTY ACIDS PO Take 1 tablet by mouth daily.       sacubitril-valsartan (ENTRESTO) 24-26 MG Take 1 tablet by mouth 2 (two) times daily. 60 tablet 6   sildenafil (VIAGRA) 50 MG tablet TAKE 1 TABLET BY MOUTH DAILY AS NEEDED FOR ERECTILE DYSFUNCTION. 10 tablet 3   spironolactone (ALDACTONE) 25 MG tablet Take 25 mg by mouth daily. Twice weekly only       tamsulosin (FLOMAX) 0.4 MG CAPS capsule Take 0.4 mg by mouth daily.       tiotropium (SPIRIVA) 18 MCG inhalation capsule Place 1 capsule (18 mcg total) into inhaler and inhale daily. 30 capsule 12   traZODone (DESYREL) 50 MG tablet TAKE 0.5-1 TABLETS BY MOUTH AT BEDTIME AS NEEDED FOR SLEEP. 90 tablet 2   TURMERIC PO Take 1 capsule by mouth daily.       atorvastatin (LIPITOR) 80 MG tablet Take 1 tablet (80 mg total) by mouth daily. 90 tablet 3   predniSONE (STERAPRED UNI-PAK 21 TAB) 10 MG (21) TBPK tablet 6 day taper - take by mouth as directed for 6 days (Patient not taking: Reported on 07/12/2022) 21 tablet 0    No current facility-administered medications for this visit.      Review of Systems  Constitutional:  Constitutional negative. HENT: HENT negative.  Eyes: Eyes negative.  Respiratory: Respiratory negative.  Cardiovascular: Cardiovascular negative.  GI: Gastrointestinal negative.  Musculoskeletal: Musculoskeletal negative.  Skin: Skin negative.  Neurological: Neurological negative. Hematologic: Hematologic/lymphatic negative.  Psychiatric: Psychiatric negative.          Objective:    Vitals:   08/18/22 0550  BP: 117/60  Pulse: 73  Resp: 17  Temp: 97.9 F (36.6 C)  SpO2: 95%    Physical Exam HENT:     Head: Normocephalic.     Nose: Nose normal.  Eyes:     Pupils: Pupils are equal, round, and reactive to light.  Cardiovascular:     Rate and Rhythm: Normal  rate.  Pulmonary:     Effort: Pulmonary effort is normal.  Abdominal:     General: Abdomen is flat.     Palpations: Abdomen is soft.  Musculoskeletal:     Cervical back: Normal range of motion and neck supple.     Right lower leg: No edema.     Left lower leg: No edema.  Skin:    General: Skin is warm and dry.     Capillary Refill: Capillary refill takes less than 2 seconds.  Neurological:     General: No focal deficit present.     Mental Status: He is alert.  Psychiatric:        Mood and Affect: Mood normal.        Behavior: Behavior normal.        Thought Content: Thought content normal.        Data: CT IMPRESSION: VASCULAR   1. Enlargement of the previously visualized fusiform infrarenal abdominal aortic aneurysm from 4.9 cm (06/21/2020) to 5.4 cm. Recommend referral to a vascular specialist if not already obtained. This recommendation follows ACR consensus guidelines: White Paper of the ACR Incidental Findings Committee II on Vascular Findings. J Am Coll Radiol 2013; 10:789-794. 2.  Aortic Atherosclerosis (ICD10-I70.0).      Assessment/Plan:    66 year old male now with greater than 5.5 cm aneurysm by our review in the office today in the presence of his daughter.  We discussed his options being continued watchful waiting with the risk of over 5 %/year of rupture versus endovascular versus open aneurysm repair.  We discussed his best option being endovascular repair.  Plan is for EVAR today in the OR. His daughter is present at bedside.     Laquonda Welby C. Donzetta Matters, MD [facility] [place] Office: [phone] Pager: [phone]

## 2022-08-18 NOTE — Anesthesia Procedure Notes (Signed)
Procedure Name: Intubation Date/Time: 08/18/2022 7:47 AM  Performed by: Eligha Bridegroom, CRNAPre-anesthesia Checklist: Patient identified, Emergency Drugs available, Suction available, Patient being monitored and Timeout performed Patient Re-evaluated:Patient Re-evaluated prior to induction Oxygen Delivery Method: Circle system utilized Preoxygenation: Pre-oxygenation with 100% oxygen Induction Type: IV induction Ventilation: Mask ventilation without difficulty and Oral airway inserted - appropriate to patient size Laryngoscope Size: Mac and 4 Grade View: Grade II Tube type: Oral Tube size: 8.0 mm Number of attempts: 1 Airway Equipment and Method: Stylet Placement Confirmation: ETT inserted through vocal cords under direct vision, positive ETCO2 and breath sounds checked- equal and bilateral Secured at: 22 cm Tube secured with: Tape Dental Injury: Teeth and Oropharynx as per pre-operative assessment

## 2022-08-18 NOTE — Anesthesia Procedure Notes (Signed)
Arterial Line Insertion Start/End9/15/2023 7:15 AM Performed by: Eligha Bridegroom, CRNA  Patient location: Pre-op. Preanesthetic checklist: patient identified, IV checked, site marked, risks and benefits discussed, surgical consent, monitors and equipment checked, pre-op evaluation and timeout performed Lidocaine 1% used for infiltration Left, radial was placed Catheter size: 20 G Hand hygiene performed  and maximum sterile barriers used   Attempts: 1 Procedure performed without using ultrasound guided technique. Following insertion, dressing applied and Biopatch. Patient tolerated the procedure well with no immediate complications.

## 2022-08-18 NOTE — Transfer of Care (Signed)
Immediate Anesthesia Transfer of Care Note  Patient: Kerry Mason  Procedure(s) Performed: ABDOMINAL AORTIC ENDOVASCULAR STENT GRAFT (Abdomen) ULTRASOUND GUIDANCE FOR VASCULAR ACCESS, BILATERAL FEMORAL ARTERIES (Bilateral: Groin)  Patient Location: PACU  Anesthesia Type:General  Level of Consciousness: awake and alert   Airway & Oxygen Therapy: Patient Spontanous Breathing and Patient connected to nasal cannula oxygen  Post-op Assessment: Report given to RN and Post -op Vital signs reviewed and stable  Post vital signs: Reviewed and stable  Last Vitals:  Vitals Value Taken Time  BP 136/94 08/18/22 0930  Temp    Pulse 145 08/18/22 0931  Resp 21 08/18/22 0931  SpO2 91 % 08/18/22 0931  Vitals shown include unvalidated device data.  Last Pain:  Vitals:   08/18/22 0613  TempSrc:   PainSc: 0-No pain         Complications: No notable events documented.

## 2022-08-18 NOTE — Progress Notes (Signed)
Patient received from [facility] bath given,CCMD notified,Vitals checked.Bilateral groin site level 0.Patient oriented to the unit

## 2022-08-19 LAB — BASIC METABOLIC PANEL
Anion gap: 9 (ref 5–15)
BUN: 28 mg/dL — ABNORMAL HIGH (ref 8–23)
CO2: 22 mmol/L (ref 22–32)
Calcium: 8.5 mg/dL — ABNORMAL LOW (ref 8.9–10.3)
Chloride: 103 mmol/L (ref 98–111)
Creatinine, Ser: 1.1 mg/dL (ref 0.61–1.24)
GFR, Estimated: >60 mL/min (ref >60)
Glucose, Bld: 164 mg/dL — ABNORMAL HIGH (ref 70–99)
Potassium: 3.7 mmol/L (ref 3.5–5.1)
Sodium: 134 mmol/L — ABNORMAL LOW (ref 135–145)

## 2022-08-19 LAB — CBC
HCT: 38.5 % — ABNORMAL LOW (ref 39.0–52.0)
Hemoglobin: 13.2 g/dL (ref 13.0–17.0)
MCH: 30.7 pg (ref 26.0–34.0)
MCHC: 34.3 g/dL (ref 30.0–36.0)
MCV: 89.5 fL (ref 80.0–100.0)
Platelets: 230 10*3/uL (ref 150–400)
RBC: 4.3 MIL/uL (ref 4.22–5.81)
RDW: 15.7 % — ABNORMAL HIGH (ref 11.5–15.5)
WBC: 16.6 10*3/uL — ABNORMAL HIGH (ref 4.0–10.5)
nRBC: 0 % (ref 0.0–0.2)

## 2022-08-19 MED ORDER — OXYCODONE-ACETAMINOPHEN 5-325 MG PO TABS: 1.0000 | ORAL_TABLET | Freq: Four times a day (QID) | ORAL | 0 refills | Status: DC | PRN

## 2022-08-19 NOTE — Discharge Instructions (Signed)
   [facility] [place]   Discharge Instructions  Endovascular Aortic Aneurysm Repair  Please refer to the following instructions for your post-procedure care. Your surgeon or Physician Assistant will discuss any changes with you.  Activity  You are encouraged to walk as much as you can. You can slowly return to normal activities but must avoid strenuous activity and heavy lifting until your doctor tells you it's OK. Avoid activities such as vacuuming or swinging a gold club. It is normal to feel tired for several weeks after your surgery. Do not drive until your doctor gives the OK and you are no longer taking prescription pain medications. It is also normal to have difficulty with sleep habits, eating, and bowel movements after surgery. These will go away with time.  Bathing/Showering  You may shower after you go home. If you have an incision, do not soak in a bathtub, hot tub, or swim until the incision heals completely.  Incision Care  Shower every day. Clean your incision with mild soap and water. Pat the area dry with a clean towel. You do not need a bandage unless otherwise instructed. Do not apply any ointments or creams to your incision. If you clothing is irritating, you may cover your incision with a dry gauze pad.  Diet  Resume your normal diet. There are no special food restrictions following this procedure. A low fat/low cholesterol diet is recommended for all patients with vascular disease. In order to heal from your surgery, it is CRITICAL to get adequate nutrition. Your body requires vitamins, minerals, and protein. Vegetables are the best source of vitamins and minerals. Vegetables also provide the perfect balance of protein. Processed food has little nutritional value, so try to avoid this.  Medications  Resume taking all of your medications unless your doctor or nurse practitioner tells you not to. If your incision is causing pain, you may take  over-the-counter pain relievers such as acetaminophen (Tylenol). If you were prescribed a stronger pain medication, please be aware these medications can cause nausea and constipation. Prevent nausea by taking the medication with a snack or meal. Avoid constipation by drinking plenty of fluids and eating foods with a high amount of fiber, such as fruits, vegetables, and grains. Do not take Tylenol if you are taking prescription pain medications.   Follow up  Our office will schedule a follow-up appointment with a C.T. scan 3-4 weeks after your surgery.  Please call us immediately for any of the following conditions  Severe or worsening pain in your legs or feet or in your abdomen back or chest. Increased pain, redness, drainage (pus) from your incision sit. Increased abdominal pain, bloating, nausea, vomiting or persistent diarrhea. Fever of 101 degrees or higher. Swelling in your leg (s),  Reduce your risk of vascular disease  Stop smoking. If you would like help call [facility] at 1-800-QUIT-NOW (1-800-784-8669) or [place] at [phone]. Manage your cholesterol Maintain a desired weight Control your diabetes Keep your blood pressure down  If you have questions, please call the office at [phone].   

## 2022-08-19 NOTE — Progress Notes (Signed)
Patient was in a great hurry, denies to hear the discharge instruction

## 2022-08-19 NOTE — Progress Notes (Signed)
Patient leaves without taking eliquis , call patient inform the importance of taking medicine today and patient verbally agreed to take the medicine at home today

## 2022-08-19 NOTE — Progress Notes (Addendum)
[facility] [place]  Subjective  - Doing well no new complaints   Objective 97/64 72 98.2 F (36.8 C) (Oral) 17 91%  Intake/Output Summary (Last 24 hours) at 08/19/2022 0814 Last data filed at 08/19/2022 0300 Gross per 24 hour  Intake 1480 ml  Output 1980 ml  Net -500 ml   Palpable DP pulse B LE Groins soft without hematoma Lungs non labored breathing No acute distress   Assessment/Planning: POD #1 TVAR  Abdomin soft Restart Eliquis today  Plan to discharge today in stable condition with palpable DP pulses B F/U in 4 weeks with CTA abd/pelvis with Dr. Fuller Plan 08/19/2022 8:14 AM --  Laboratory Lab Results: Recent Labs    08/18/22 0945 08/19/22 0240  WBC 10.4 16.6*  HGB 14.4 13.2  HCT 42.2 38.5*  PLT 237 230   BMET Recent Labs    08/18/22 0945 08/19/22 0240  NA 136 134*  K 4.4 3.7  CL 107 103  CO2 22 22  GLUCOSE 106* 164*  BUN 28* 28*  CREATININE 1.03 1.10  CALCIUM 8.6* 8.5*    COAG Lab Results  Component Value Date   INR 1.1 08/18/2022   INR 1.3 (H) 08/10/2022   INR 1.1 01/04/2021   No results found for: "PTT"  Addendum: Patient was discharged prior to my evaluation but I have reviewed his chart from uncomplicated EVAR and follow-up has been scheduled.   Brandon C. Donzetta Matters, MD [facility] [place] Office: [phone] Pager: [phone]

## 2022-08-22 ENCOUNTER — Encounter (HOSPITAL_COMMUNITY): Payer: Self-pay | Admitting: Vascular Surgery

## 2022-08-24 NOTE — Discharge Summary (Signed)
[facility] Discharge Summary   Patient ID:  Kerry Mason MRN: [id number] DOB/AGE: Oct 27, 1956 66 y.o.  Admit date: 08/18/2022 Discharge date: 08/24/2022 Date of Surgery: 08/18/2022 Surgeon: Surgeon(s): Waynetta Sandy, MD  Admission Diagnosis: AAA (abdominal aortic aneurysm) ([place]) [I71.40] Aneurysm of abdominal aorta ([place]) [I71.40]  Discharge Diagnoses:  AAA (abdominal aortic aneurysm) ([place]) [I71.40] Aneurysm of abdominal aorta ([place]) [I71.40]  Secondary Diagnoses: Past Medical History:  Diagnosis Date   AAA (abdominal aortic aneurysm) ([place])    Acute systolic CHF (congestive heart failure) ([place])    AICD (automatic cardioverter/defibrillator) present    Anxiety    Ascending aortic aneurysm ([place]) 08/31/2017   Back pain 08/31/2017   Basal cell carcinoma    left arm   CAD (coronary artery disease)    a. 2000: s/p stent of RCA 2000 with BMS  b. 2015: STEMI s/p LHC with old occlusion of RCA and DESx2 to LAD  c. 01/12/40: complicated PCI on 3/2 for CTO of mid RCA with coronary perforation and cardiac tamponade requiring emergent pericardiocentesis      Cardiac tamponade    a. 02/03/15 2/2 coronary perforation during CTO procedure. Sealed with graftmaster coated stent.    COPD (chronic obstructive pulmonary disease) ([place]) 08/31/2017   Gout    History of pneumonia    HTN (hypertension)    Hyperlipidemia    Ischemic cardiomyopathy    a. 2D ECHO: EF 25-30%. 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 male history of abdominal aortic  aneurysm recently underwent ultrasound which demonstrated increased growth   Hospital Course:  Kerry Mason is a 66 y.o. male is S/P  Procedure(s): ABDOMINAL AORTIC ENDOVASCULAR STENT GRAFT ULTRASOUND GUIDANCE FOR VASCULAR ACCESS, BILATERAL FEMORAL ARTERIES  Abdomin soft Restart Eliquis today  Plan to discharge today in stable condition with palpable DP pulses B F/U in 4 weeks with CTA abd/pelvis with Dr. Donzetta Matters   Significant Diagnostic Studies: CBC Lab Results  Component Value Date   WBC 16.6 (H) 08/19/2022   HGB 13.2 08/19/2022   HCT 38.5 (L) 08/19/2022   MCV 89.5 08/19/2022   PLT 230 08/19/2022    BMET    Component Value Date/Time   NA 134 (L) 08/19/2022 0240   NA 140 03/29/2022 1056   K 3.7 08/19/2022 0240   CL 103 08/19/2022 0240   CO2 22 08/19/2022 0240   GLUCOSE 164 (H) 08/19/2022 0240   BUN 28 (H) 08/19/2022 0240   BUN 16 03/29/2022 1056   CREATININE 1.10 08/19/2022 0240   CREATININE 0.93 12/24/2015 0909   CALCIUM 8.5 (L) 08/19/2022 0240   GFRNONAA >60 08/19/2022 0240   GFRAA 99 01/18/2021 1519   COAG Lab Results  Component Value Date   INR 1.1 08/18/2022   INR 1.3 (H) 08/10/2022   INR 1.1 01/04/2021     Disposition:  Discharge to :Home Discharge Instructions     Call MD for:  redness, tenderness, or signs of infection (pain, swelling, bleeding, redness, odor or green/yellow discharge around incision site)  Complete by: As directed    Call MD for:  severe or increased pain, loss or decreased feeling  in affected limb(s)   Complete by: As directed    Call MD for:  temperature >100.5   Complete by: As directed    Discharge instructions   Complete by: As directed    Gradually increased daily activies.  Shower as needed.  No heavy lifting for 1-2 weeks   Resume previous diet   Complete by: As directed       Allergies as of 08/19/2022   No Known Allergies      Medication List     TAKE these medications    acetaminophen 325 MG  tablet Commonly known as: TYLENOL Take 2 tablets (650 mg total) by mouth every 4 (four) hours as needed for headache or mild pain.   allopurinol 300 MG tablet Commonly known as: ZYLOPRIM Take 1 tablet (300 mg total) by mouth 2 (two) times daily.   apixaban 5 MG Tabs tablet Commonly known as: ELIQUIS Take 1 tablet (5 mg total) by mouth 2 (two) times daily.   aspirin EC 81 MG tablet Take 81 mg by mouth daily.   atorvastatin 80 MG tablet Commonly known as: LIPITOR Take 1 tablet (80 mg total) by mouth daily.   CALCIUM 1200 PO Take 1 capsule by mouth daily.   carvedilol 6.25 MG tablet Commonly known as: COREG Take 1 tablet (6.25 mg total) by mouth 2 (two) times daily.   cholecalciferol 25 MCG (1000 UNIT) tablet Commonly known as: VITAMIN D3 Take 1,000 Units by mouth daily.   colchicine 0.6 MG tablet TAKE 2 TABLETS ONE TIME. THEN TAKE 1 TABLET AFTER ONE HOUR. THEN TAKE 1 TABLET DAILY FOR 5 DAYS. What changed:  how much to take how to take this when to take this   empagliflozin 10 MG Tabs tablet Commonly known as: Jardiance Take 1 tablet (10 mg total) by mouth daily.   ezetimibe 10 MG tablet Commonly known as: ZETIA Take 1 tablet (10 mg total) by mouth daily.   furosemide 40 MG tablet Commonly known as: LASIX TAKE 1 TABLET BY MOUTH EVERY DAY   isosorbide mononitrate 30 MG 24 hr tablet Commonly known as: IMDUR Take 1 tablet (30 mg total) by mouth daily.   Magnesium Oxide -Mg Supplement 200 MG Tabs Take 1 tablet (200 mg total) by mouth 2 (two) times daily. What changed: when to take this   multivitamin tablet Take 1 tablet by mouth daily.   nicotine 21 mg/24hr patch Commonly known as: NICODERM CQ - dosed in mg/24 hours Place 1 patch (21 mg total) onto the skin daily.   nicotine polacrilex 4 MG lozenge Commonly known as: COMMIT Take 1 lozenge (4 mg total) by mouth as needed for smoking cessation.   nitroGLYCERIN 0.4 MG SL tablet Commonly known as:  NITROSTAT DISSOLVE 1 TABLET UNDER THE TONGUE EVERY 5 MINUTES AS NEEDED FOR CHEST PAIN. MAX 3 DOSES/15 MIN What changed:  how much to take how to take this when to take this reasons to take this additional instructions   OMEGA-3 FATTY ACIDS PO Take 1,200 mg by mouth daily.   oxyCODONE-acetaminophen 5-325 MG tablet Commonly known as: PERCOCET/ROXICET Take 1 tablet by mouth every 6 (six) hours as needed for severe pain or moderate pain.   sacubitril-valsartan 24-26 MG Commonly known as: ENTRESTO Take 1 tablet by mouth 2 (two) times daily.   sildenafil 50 MG tablet Commonly known as: VIAGRA TAKE 1 TABLET  BY MOUTH DAILY AS NEEDED FOR ERECTILE DYSFUNCTION. What changed: See the new instructions.   spironolactone 25 MG tablet Commonly known as: ALDACTONE Take 25 mg by mouth 2 (two) times a week.   Symbicort 160-4.5 MCG/ACT inhaler Generic drug: budesonide-formoterol INHALE 2 PUFFS INTO THE LUNGS TWICE A DAY What changed: See the new instructions.   tamsulosin 0.4 MG Caps capsule Commonly known as: FLOMAX Take 0.4 mg by mouth daily.   tiotropium 18 MCG inhalation capsule Commonly known as: SPIRIVA Place 1 capsule (18 mcg total) into inhaler and inhale daily.   traZODone 50 MG tablet Commonly known as: DESYREL TAKE 0.5-1 TABLETS BY MOUTH AT BEDTIME AS NEEDED FOR SLEEP. What changed: See the new instructions.   vitamin C 1000 MG tablet Take 1,000 mg by mouth daily.       Verbal and written Discharge instructions given to the patient. Wound care per Discharge AVS  Follow-up Information     [facility] Follow up in 4 week(s).   Specialty: Vascular Surgery Why: Office will call to arrange your appt(s) (sent) Contact information: [place] [place] [phone]                Signed: Roxy Horseman 08/24/2022, 9:24 AM

## 2022-08-31 ENCOUNTER — Other Ambulatory Visit: Payer: Self-pay | Admitting: Cardiology

## 2022-08-31 ENCOUNTER — Telehealth: Payer: Self-pay

## 2022-08-31 DIAGNOSIS — I5022 Chronic systolic (congestive) heart failure: Secondary | ICD-10-CM

## 2022-08-31 NOTE — Telephone Encounter (Signed)
Pt called with c/o L sided "upper back/kidney pain" with nausea and minimal appetite. Pt states he has lost "significant weight" and this all began a little over a week ago. Pt denies any UTI symptoms. MD has been made aware. He is getting scheduled for a CT scan and has been made a f/u appt next week. Pt has been advised to call us or report to the ED if he experiences any sob, worsening pain, vomiting. Pt is aware of the above.

## 2022-09-01 ENCOUNTER — Other Ambulatory Visit: Payer: Self-pay

## 2022-09-01 DIAGNOSIS — I714 Abdominal aortic aneurysm, without rupture, unspecified: Secondary | ICD-10-CM

## 2022-09-05 ENCOUNTER — Ambulatory Visit (HOSPITAL_COMMUNITY)
Admission: RE | Admit: 2022-09-05 | Discharge: 2022-09-05 | Disposition: A | Discharge status: Home / Self Care | Payer: Medicare Other | Source: Ambulatory Visit | Attending: Vascular Surgery | Admitting: Vascular Surgery

## 2022-09-05 DIAGNOSIS — I714 Abdominal aortic aneurysm, without rupture, unspecified: Secondary | ICD-10-CM | POA: Insufficient documentation

## 2022-09-05 MED ORDER — IOHEXOL 350 MG/ML SOLN
80.0000 mL | Freq: Once | INTRAVENOUS | Status: AC | PRN
  Administered 2022-09-05: 80 mL via INTRAVENOUS

## 2022-09-06 ENCOUNTER — Encounter: Payer: Self-pay | Admitting: Vascular Surgery

## 2022-09-06 ENCOUNTER — Ambulatory Visit (INDEPENDENT_AMBULATORY_CARE_PROVIDER_SITE_OTHER): Payer: Medicare Other | Admitting: Vascular Surgery

## 2022-09-06 VITALS — BP 106/70 | HR 68 | Temp 98.0°F | Resp 20 | Ht 73.0 in | Wt 217.0 lb

## 2022-09-06 DIAGNOSIS — I7141 Pararenal abdominal aortic aneurysm, without rupture: Secondary | ICD-10-CM

## 2022-09-06 NOTE — Progress Notes (Signed)
Patient ID: Kerry Mason, male   DOB: 1956/01/19, 66 y.o.   MRN: [id number]  Reason for Consult: Routine Post Op   Referred by [name], NP  Subjective:     HPI:  Kerry Mason is a 66 y.o. male  status post uncomplicated endovascular aneurysm repair.  Since surgery he has had decreased appetite and has lost about 15 pounds and also his left flank pain.  He has had associated fatigue but does not have any fevers chills or rashes.  Past Medical History:  Diagnosis Date   AAA (abdominal aortic aneurysm) (HCC)    Acute systolic CHF (congestive heart failure) ([place])    AICD (automatic cardioverter/defibrillator) present    Anxiety    Ascending aortic aneurysm ([place]) 08/31/2017   Back pain 08/31/2017   Basal cell carcinoma    left arm   CAD (coronary artery disease)    a. 2000: s/p stent of RCA 2000 with BMS  b. 2015: STEMI s/p LHC with old occlusion of RCA and DESx2 to LAD  c. 5/0/93: complicated PCI on 3/2 for CTO of mid RCA with coronary perforation and cardiac tamponade requiring emergent pericardiocentesis      Cardiac tamponade    a. 02/03/15 2/2 coronary perforation during CTO procedure. Sealed with graftmaster coated stent.    COPD (chronic obstructive pulmonary disease) ([place]) 08/31/2017   Gout    History of pneumonia    HTN (hypertension)    Hyperlipidemia    Ischemic cardiomyopathy    a. 2D ECHO: EF 25-30%. 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   LIMA to LAD, SVG to ramus intermediate branch, EVH via right thigh    Shortness of breath dyspnea    Tobacco abuse    Urinary frequency    Family History  Problem Relation Age of Onset   CAD Father        PTCA   Cancer Mother        LYMPHOMA   Past Surgical History:  Procedure Laterality Date   ABDOMINAL AORTIC ENDOVASCULAR STENT GRAFT N/A 08/18/2022   Procedure: ABDOMINAL AORTIC ENDOVASCULAR STENT GRAFT;   Surgeon: Waynetta Sandy, MD;  Location: [facility];  Service: Vascular;  Laterality: N/A;   CARDIAC CATHETERIZATION     CARDIAC CATHETERIZATION N/A 06/14/2015   Procedure: Left Heart Cath and Coronary Angiography;  Surgeon: Lorretta Harp, MD;  Location: [facility];  Service: Cardiovascular;  Laterality: N/A;   CARDIAC CATHETERIZATION N/A 08/18/2015   Procedure: Intravascular Pressure Wire/FFR Study;  Surgeon: Peter M Martinique, MD;  Location: [facility];  Service: Cardiovascular;  Laterality: N/A;   COLONOSCOPY     COLONOSCOPY WITH PROPOFOL N/A 03/09/2022   Procedure: COLONOSCOPY WITH PROPOFOL;  Surgeon: Yetta Flock, MD;  Location: [facility];  Service: Gastroenterology;  Laterality: N/A;   CORONARY ANGIOPLASTY  2000   CORONARY ANGIOPLASTY WITH STENT PLACEMENT  2015   CORONARY ARTERY BYPASS GRAFT N/A 09/02/2015   Procedure: CORONARY ARTERY BYPASS GRAFTING (CABG) x 2 (LIMA-LAD, SVG-Intermediate) ENDOSCOPIC GREATER SAPHENOUS VEIN HARVEST RIGHT THIGH;  Surgeon: Rexene Alberts, MD;  Location: [facility];  Service: Open Heart Surgery;  Laterality: N/A;   CORONARY BALLOON ANGIOPLASTY N/A 01/04/2021   Procedure: CORONARY BALLOON ANGIOPLASTY;  Surgeon: Jettie Booze, MD;  Location: [facility];  Service: Cardiovascular;  Laterality: N/A;  FOOT SURGERY     IMPLANTABLE CARDIOVERTER DEFIBRILLATOR IMPLANT N/A 04/17/2014   Procedure: IMPLANTABLE CARDIOVERTER DEFIBRILLATOR IMPLANT;  Surgeon: Evans Lance, MD;  Location: [facility] CATH LAB;  Service: Cardiovascular;  Laterality: N/A;   LEFT HEART CATH AND CORONARY ANGIOGRAPHY N/A 01/04/2021   Procedure: LEFT HEART CATH AND CORONARY ANGIOGRAPHY;  Surgeon: Jettie Booze, MD;  Location: [facility];  Service: Cardiovascular;  Laterality: N/A;   LEFT HEART CATH AND CORS/GRAFTS ANGIOGRAPHY N/A 08/30/2017   Procedure: LEFT HEART CATH AND CORS/GRAFTS ANGIOGRAPHY;  Surgeon: Nelva Bush, MD;  Location: [facility];   Service: Cardiovascular;  Laterality: N/A;   LEFT HEART CATHETERIZATION WITH CORONARY ANGIOGRAM N/A 12/11/2013   Procedure: LEFT HEART CATHETERIZATION WITH CORONARY ANGIOGRAM;  Surgeon: Peter M Martinique, MD;  Location: [facility] CATH LAB;  Service: Cardiovascular;  Laterality: N/A;   LEFT HEART CATHETERIZATION WITH CORONARY ANGIOGRAM N/A 01/05/2015   Procedure: LEFT HEART CATHETERIZATION WITH CORONARY ANGIOGRAM;  Surgeon: Peter M Martinique, MD;  Location: [facility] CATH LAB;  Service: Cardiovascular;  Laterality: N/A;   PERCUTANEOUS CORONARY STENT INTERVENTION (PCI-S)  12/11/2013   Procedure: PERCUTANEOUS CORONARY STENT INTERVENTION (PCI-S);  Surgeon: Peter M Martinique, MD;  Location: [facility] CATH LAB;  Service: Cardiovascular;;  prov LAD and mid LAD   PERCUTANEOUS CORONARY STENT INTERVENTION (PCI-S) N/A 02/03/2015   Procedure: PERCUTANEOUS CORONARY STENT INTERVENTION (PCI-S);  Surgeon: Jettie Booze, MD;  Location: [facility] CATH LAB;  Service: Cardiovascular;  Laterality: N/A;   POLYPECTOMY  03/09/2022   Procedure: POLYPECTOMY;  Surgeon: Yetta Flock, MD;  Location: [facility];  Service: Gastroenterology;;   SPERMATOCELECTOMY Right 02/21/2022   Procedure: RIGHT SPERMATOCELECTOMY;  Surgeon: Irine Seal, MD;  Location: [facility];  Service: Urology;  Laterality: Right;   TEE WITHOUT CARDIOVERSION N/A 09/02/2015   Procedure: TRANSESOPHAGEAL ECHOCARDIOGRAM (TEE);  Surgeon: Rexene Alberts, MD;  Location: [facility];  Service: Open Heart Surgery;  Laterality: N/A;   ULTRASOUND GUIDANCE FOR VASCULAR ACCESS Bilateral 08/18/2022   Procedure: ULTRASOUND GUIDANCE FOR VASCULAR ACCESS, BILATERAL FEMORAL ARTERIES;  Surgeon: Waynetta Sandy, MD;  Location: [facility];  Service: Vascular;  Laterality: Bilateral;    Short Social History:  Social History   Tobacco Use   Smoking status: Every Day    Packs/day: 0.50    Years: 35.00    Total pack years: 17.50    Types: Cigarettes    Passive exposure: Never   Smokeless tobacco: Never   Substance Use Topics   Alcohol use: No    Alcohol/week: 0.0 standard drinks of alcohol    No Known Allergies  Current Outpatient Medications  Medication Sig Dispense Refill   acetaminophen (TYLENOL) 325 MG tablet Take 2 tablets (650 mg total) by mouth every 4 (four) hours as needed for headache or mild pain.     allopurinol (ZYLOPRIM) 300 MG tablet Take 1 tablet (300 mg total) by mouth 2 (two) times daily. 180 tablet 3   apixaban (ELIQUIS) 5 MG TABS tablet Take 1 tablet (5 mg total) by mouth 2 (two) times daily. 60 tablet 11   Ascorbic Acid (VITAMIN C) 1000 MG tablet Take 1,000 mg by mouth daily.     aspirin EC 81 MG tablet Take 81 mg by mouth daily.     Calcium Carbonate-Vit D-Min (CALCIUM 1200 PO) Take 1 capsule by mouth daily.     carvedilol (COREG) 6.25 MG tablet Take 1 tablet (6.25 mg total) by mouth 2 (two) times daily. 180 tablet 3   cholecalciferol (VITAMIN  D3) 25 MCG (1000 UNIT) tablet Take 1,000 Units by mouth daily.     colchicine 0.6 MG tablet TAKE 2 TABLETS ONE TIME. THEN TAKE 1 TABLET AFTER ONE HOUR. THEN TAKE 1 TABLET DAILY FOR 5 DAYS. (Patient taking differently: Take 0.6-1.2 mg by mouth See admin instructions. TAKE 2 TABLETS ONE TIME. THEN TAKE 1 TABLET AFTER ONE HOUR. THEN TAKE 1 TABLET DAILY FOR 5 DAYS.) 120 tablet 1   ezetimibe (ZETIA) 10 MG tablet Take 1 tablet (10 mg total) by mouth daily. 90 tablet 3   furosemide (LASIX) 40 MG tablet TAKE 1 TABLET BY MOUTH EVERY DAY (Patient taking differently: Take 40 mg by mouth daily.) 90 tablet 1   isosorbide mononitrate (IMDUR) 30 MG 24 hr tablet Take 1 tablet (30 mg total) by mouth daily. 90 tablet 3   JARDIANCE 10 MG TABS tablet TAKE 1 TABLET BY MOUTH EVERY DAY 90 tablet 1   Magnesium Oxide 200 MG TABS Take 1 tablet (200 mg total) by mouth 2 (two) times daily. (Patient taking differently: Take 200 mg by mouth daily.) 60 tablet 0   Multiple Vitamin (MULTIVITAMIN) tablet Take 1 tablet by mouth daily.     nicotine (NICODERM CQ -  DOSED IN MG/24 HOURS) 21 mg/24hr patch Place 1 patch (21 mg total) onto the skin daily. 28 patch 2   nicotine polacrilex (COMMIT) 4 MG lozenge Take 1 lozenge (4 mg total) by mouth as needed for smoking cessation. 100 tablet 2   nitroGLYCERIN (NITROSTAT) 0.4 MG SL tablet DISSOLVE 1 TABLET UNDER THE TONGUE EVERY 5 MINUTES AS NEEDED FOR CHEST PAIN. MAX 3 DOSES/15 MIN (Patient taking differently: Place 0.4 mg under the tongue every 5 (five) minutes as needed for chest pain.) 25 tablet 5   OMEGA-3 FATTY ACIDS PO Take 1,200 mg by mouth daily.     oxyCODONE-acetaminophen (PERCOCET/ROXICET) 5-325 MG tablet Take 1 tablet by mouth every 6 (six) hours as needed for severe pain or moderate pain. 20 tablet 0   sacubitril-valsartan (ENTRESTO) 24-26 MG Take 1 tablet by mouth 2 (two) times daily. 60 tablet 6   sildenafil (VIAGRA) 50 MG tablet TAKE 1 TABLET BY MOUTH DAILY AS NEEDED FOR ERECTILE DYSFUNCTION. (Patient taking differently: Take 50 mg by mouth daily as needed for erectile dysfunction.) 10 tablet 3   spironolactone (ALDACTONE) 25 MG tablet Take 25 mg by mouth 2 (two) times a week.     SYMBICORT 160-4.5 MCG/ACT inhaler INHALE 2 PUFFS INTO THE LUNGS TWICE A DAY (Patient taking differently: Inhale 2 puffs into the lungs 2 (two) times daily.) 10.2 each 6   tamsulosin (FLOMAX) 0.4 MG CAPS capsule Take 0.4 mg by mouth daily.     tiotropium (SPIRIVA) 18 MCG inhalation capsule Place 1 capsule (18 mcg total) into inhaler and inhale daily. 30 capsule 12   traZODone (DESYREL) 50 MG tablet TAKE 0.5-1 TABLETS BY MOUTH AT BEDTIME AS NEEDED FOR SLEEP. (Patient taking differently: Take 25-50 mg by mouth at bedtime as needed for sleep.) 90 tablet 2   atorvastatin (LIPITOR) 80 MG tablet Take 1 tablet (80 mg total) by mouth daily. 90 tablet 3   No current facility-administered medications for this visit.    Review of Systems  Constitutional: Positive for fatigue and unexpected weight change.  GI: Positive for nausea.   Musculoskeletal: Positive for back pain.        Objective:  Objective   Vitals:   09/06/22 1206  BP: 106/70  Pulse: 68  Resp: 20  Temp: 98 F (36.7 C)  SpO2: 94%  Weight: 217 lb (98.4 kg)  Height: '6\' 1"'$  (1.854 m)   Body mass index is 28.63 kg/m.  Physical Exam HENT:     Head: Normocephalic.  Cardiovascular:     Rate and Rhythm: Normal rate.  Pulmonary:     Effort: Pulmonary effort is normal.  Abdominal:     General: Abdomen is flat.     Palpations: Abdomen is soft.  Musculoskeletal:     Comments: Groins soft without hematoma  Skin:    General: Skin is warm.  Neurological:     Mental Status: He is alert.     Data: Study Result  Narrative & Impression  CLINICAL DATA:  Abdominal aortic aneurysm without rupture, unspecified part. Aneurysm surveillance.   EXAM: CTA ABDOMEN AND PELVIS WITHOUT AND WITH CONTRAST   TECHNIQUE: Multidetector CT imaging of the abdomen and pelvis was performed using the standard protocol during bolus administration of intravenous contrast. Multiplanar reconstructed images and MIPs were obtained and reviewed to evaluate the vascular anatomy.   RADIATION DOSE REDUCTION: This exam was performed according to the departmental dose-optimization program which includes automated exposure control, adjustment of the mA and/or kV according to patient size and/or use of iterative reconstruction technique.   CONTRAST:  89m OMNIPAQUE IOHEXOL 350 MG/ML SOLN   COMPARISON:  07/05/2022   FINDINGS: VASCULAR   Aorta: Endovascular repair of the abdominal aortic aneurysm with a bifurcated aortic stent graft. The aortic stent graft and bilateral limbs are patent. The main body of the graft is positioned below the renal arteries. Maximum dimension of the aortic aneurysm sac is 5.1 cm in the AP dimension on sequence 5 image 147 and this is unchanged from the preoperative imaging. Transverse dimension of the aneurysm sac on sequence 8, image 88  measures 5.3 cm and this previously measured 5.4 cm. Aneurysm sac has not significantly changed in size. High-density material along the proximal and left side of the aneurysm sac on sequence 5 image 126 likely represents an endoleak but there are not pre contrast images to definitively call this an endoleak.   Celiac: Approximately 40% near the origin of the celiac trunk.   SMA: Patent without evidence of aneurysm, dissection, vasculitis or significant stenosis.   Renals: Both renal arteries are patent without evidence of aneurysm, dissection, vasculitis, fibromuscular dysplasia or significant stenosis.   IMA: There appears to be a small amount of flow in the IMA and suspect the IMA is associated with a type 2 endoleak.   Inflow: Stent grafts terminate in the common iliac arteries bilaterally. Internal and external iliac arteries are patent bilaterally with diffuse atherosclerotic calcifications.   Proximal Outflow: Proximal femoral arteries are patent bilaterally.   Veins: No obvious venous abnormality within the limitations of this arterial phase study.   Review of the MIP images confirms the above findings.   NON-VASCULAR   Lower chest: Patchy densities in the visualized lung bases could be related to atelectasis but poorly characterized. No large pleural effusions. Prior median sternotomy. ICD lead in the right ventricle. Evidence for coronary artery calcifications   Hepatobiliary: Normal appearance of the liver and gallbladder.   Pancreas: Unremarkable. No pancreatic ductal dilatation or surrounding inflammatory changes.   Spleen: Normal in size without focal abnormality.   Adrenals/Urinary Tract: Normal adrenal glands. Low-density cysts in both kidneys. The cysts do not require dedicated follow-up. No suspicious renal lesions. No hydronephrosis. Normal urinary bladder.   Stomach/Bowel: Normal appearance of the stomach.  Extensive diverticulosis involving the  sigmoid colon without acute bowel inflammation. No evidence for bowel dilatation or obstruction.   Lymphatic: Mild stranding in the periaortic region in the juxtarenal area. This finding is poorly characterized because there is motion artifact in this area. Overall, there is no significant lymph node enlargement in the abdomen and pelvis. Mildly prominent lymph nodes in left upper thigh are nonspecific and probably similar to the prior examination.   Reproductive: Prostate is slightly prominent measuring 4.9 cm with calcifications.   Other: No free fluid. Small umbilical hernia containing fat. Negative for free air.   Musculoskeletal: Bilateral pars defects at L5 with 9 mm anterolisthesis of L5 on S1. Disc space loss at L5-S1. Findings at the lumbosacral junction are unchanged. No acute bone abnormality.   IMPRESSION: VASCULAR   1. Endovascular repair of the abdominal aortic aneurysm. Bifurcated aortic stent graft is patent. 2. Aortic aneurysm sac is stable in size measuring up to 5.3 cm. There is probably an endoleak but this is poorly characterized due to the technique on this examination. 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 3. 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   NON-VASCULAR   1. No acute abnormality in the abdomen or pelvis. 2. Limited evaluation of the periaortic retroperitoneum due to motion artifact. Difficult to exclude mild stranding in this area. Recommend attention to this area on follow up imaging. 3. Prostate enlargement. 4. Sigmoid diverticulosis without acute inflammation. 5. Bilateral pars defects at L5 with stable anterolisthesis of L5 on S1.         Assessment/Plan:    66 year old male status post uncomplicated endovascular aneurysm repair now with nausea and weight loss after surgery.  We reviewed his CT scan together today and though I was suspicious initially that he may have had a renal  infarct or even worse could have an infection I do not see any evidence of inflammation in his kidneys both fill briskly without any impingement on the renal arteries.  We discussed possibility of allergic reaction although without fevers or rashes this is certainly less likely.  He does have back issues but this does not necessarily explain the nausea and weight loss and fatigue.  I do think we should repeat his CT scan in case there is something I was not entirely seen today on imaging.  I will follow him up in 4 weeks with a new CT scan unless all of his issues have resolved at which time we can see him back at the regular scheduled interval in 10 months.  All the questions were answered with the patient and his daughter.     Waynetta Sandy MD [facility]

## 2022-09-15 ENCOUNTER — Other Ambulatory Visit: Payer: Self-pay | Admitting: Cardiology

## 2022-09-15 DIAGNOSIS — Z95810 Presence of automatic (implantable) cardiac defibrillator: Secondary | ICD-10-CM

## 2022-09-15 DIAGNOSIS — I255 Ischemic cardiomyopathy: Secondary | ICD-10-CM

## 2022-09-17 ENCOUNTER — Other Ambulatory Visit: Payer: Self-pay | Admitting: Cardiology

## 2022-09-22 ENCOUNTER — Other Ambulatory Visit: Payer: Self-pay

## 2022-09-22 DIAGNOSIS — I714 Abdominal aortic aneurysm, without rupture, unspecified: Secondary | ICD-10-CM

## 2022-09-27 ENCOUNTER — Other Ambulatory Visit: Payer: Self-pay | Admitting: Cardiology

## 2022-09-27 ENCOUNTER — Encounter: Payer: Medicare Other | Admitting: Vascular Surgery

## 2022-10-02 ENCOUNTER — Ambulatory Visit
Admission: RE | Admit: 2022-10-02 | Discharge: 2022-10-02 | Disposition: A | Discharge status: Home / Self Care | Payer: Medicare Other | Source: Ambulatory Visit | Attending: Vascular Surgery | Admitting: Vascular Surgery

## 2022-10-02 DIAGNOSIS — I714 Abdominal aortic aneurysm, without rupture, unspecified: Secondary | ICD-10-CM

## 2022-10-02 MED ORDER — IOPAMIDOL (ISOVUE-370) INJECTION 76%
80.0000 mL | Freq: Once | INTRAVENOUS | Status: AC | PRN
  Administered 2022-10-02: 80 mL via INTRAVENOUS

## 2022-10-04 ENCOUNTER — Encounter: Payer: Self-pay | Admitting: Vascular Surgery

## 2022-10-04 ENCOUNTER — Ambulatory Visit (INDEPENDENT_AMBULATORY_CARE_PROVIDER_SITE_OTHER): Payer: Medicare Other | Admitting: Vascular Surgery

## 2022-10-04 VITALS — BP 96/65 | HR 68 | Temp 98.0°F | Resp 20 | Ht 73.0 in | Wt 217.0 lb

## 2022-10-04 DIAGNOSIS — I7143 Infrarenal abdominal aortic aneurysm, without rupture: Secondary | ICD-10-CM

## 2022-10-04 NOTE — Progress Notes (Signed)
Patient ID: Kerry Mason, male   DOB: [birth year], 66 y.o.   MRN: [id number]  Reason for Consult: Routine Post Op   Referred by [name], NP  Subjective:     HPI:  Kerry Mason is a 65 y.o. male status post endovascular aneurysm repair.  At last visit patient was having significant nausea and weight loss CT scan was reassuring.  Now he follows up with repeat CT scan.  All symptoms have resolved except lower back pain.  Past Medical History:  Diagnosis Date   AAA (abdominal aortic aneurysm) (HCC)    Acute systolic CHF (congestive heart failure) ([place])    AICD (automatic cardioverter/defibrillator) present    Anxiety    Ascending aortic aneurysm ([place]) 08/31/2017   Back pain 08/31/2017   Basal cell carcinoma    left arm   CAD (coronary artery disease)    a. 2000: s/p stent of RCA 2000 with BMS  b. 2015: STEMI s/p LHC with old occlusion of RCA and DESx2 to LAD  c. 08/09/74: complicated PCI on 3/2 for CTO of mid RCA with coronary perforation and cardiac tamponade requiring emergent pericardiocentesis      Cardiac tamponade    a. 02/03/15 2/2 coronary perforation during CTO procedure. Sealed with graftmaster coated stent.    COPD (chronic obstructive pulmonary disease) ([place]) 08/31/2017   Gout    History of pneumonia    HTN (hypertension)    Hyperlipidemia    Ischemic cardiomyopathy    a. 2D ECHO: EF 25-30%. 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/2015   LIMA to LAD, SVG to ramus intermediate branch, EVH via right thigh    Shortness of breath dyspnea    Tobacco abuse    Urinary frequency    Family History  Problem Relation Age of Onset   CAD Father        PTCA   Cancer Mother        LYMPHOMA   Past Surgical History:  Procedure Laterality Date   ABDOMINAL AORTIC ENDOVASCULAR STENT GRAFT N/A 08/18/2022   Procedure: ABDOMINAL AORTIC ENDOVASCULAR STENT GRAFT;  Surgeon: Waynetta Sandy, MD;  Location: [facility];  Service: Vascular;  Laterality: N/A;   CARDIAC CATHETERIZATION     CARDIAC CATHETERIZATION N/A 06/14/2015   Procedure: Left Heart Cath and Coronary Angiography;  Surgeon: Lorretta Harp, MD;  Location: [facility];  Service: Cardiovascular;  Laterality: N/A;   CARDIAC CATHETERIZATION N/A 08/18/2015   Procedure: Intravascular Pressure Wire/FFR Study;  Surgeon: Peter M Martinique, MD;  Location: [facility];  Service: Cardiovascular;  Laterality: N/A;   COLONOSCOPY     COLONOSCOPY WITH PROPOFOL N/A 03/09/2022   Procedure: COLONOSCOPY WITH PROPOFOL;  Surgeon: Yetta Flock, MD;  Location: [facility];  Service: Gastroenterology;  Laterality: N/A;   CORONARY ANGIOPLASTY  2000   CORONARY ANGIOPLASTY WITH STENT PLACEMENT  2015   CORONARY ARTERY BYPASS GRAFT N/A 09/02/2015   Procedure: CORONARY ARTERY BYPASS GRAFTING (CABG) x 2 (LIMA-LAD, SVG-Intermediate) ENDOSCOPIC GREATER SAPHENOUS VEIN HARVEST RIGHT THIGH;  Surgeon: Rexene Alberts, MD;  Location: [facility];  Service: Open Heart Surgery;  Laterality: N/A;   CORONARY BALLOON ANGIOPLASTY N/A 01/04/2021   Procedure: CORONARY BALLOON ANGIOPLASTY;  Surgeon: Jettie Booze, MD;  Location: [facility];  Service: Cardiovascular;  Laterality: N/A;   FOOT SURGERY  IMPLANTABLE CARDIOVERTER DEFIBRILLATOR IMPLANT N/A 04/17/2014   Procedure: IMPLANTABLE CARDIOVERTER DEFIBRILLATOR IMPLANT;  Surgeon: Evans Lance, MD;  Location: [facility];  Service: Cardiovascular;  Laterality: N/A;   LEFT HEART CATH AND CORONARY ANGIOGRAPHY N/A 01/04/2021   Procedure: LEFT HEART CATH AND CORONARY ANGIOGRAPHY;  Surgeon: Jettie Booze, MD;  Location: [facility];  Service: Cardiovascular;  Laterality: N/A;   LEFT HEART CATH AND CORS/GRAFTS ANGIOGRAPHY N/A 08/30/2017   Procedure: LEFT HEART CATH AND CORS/GRAFTS ANGIOGRAPHY;  Surgeon: Nelva Bush, MD;  Location: [facility];  Service:  Cardiovascular;  Laterality: N/A;   LEFT HEART CATHETERIZATION WITH CORONARY ANGIOGRAM N/A 12/11/2013   Procedure: LEFT HEART CATHETERIZATION WITH CORONARY ANGIOGRAM;  Surgeon: Peter M Martinique, MD;  Location: [facility] LAB;  Service: Cardiovascular;  Laterality: N/A;   LEFT HEART CATHETERIZATION WITH CORONARY ANGIOGRAM N/A 01/05/2015   Procedure: LEFT HEART CATHETERIZATION WITH CORONARY ANGIOGRAM;  Surgeon: Peter M Martinique, MD;  Location: [facility] LAB;  Service: Cardiovascular;  Laterality: N/A;   PERCUTANEOUS CORONARY STENT INTERVENTION (PCI-S)  12/11/2013   Procedure: PERCUTANEOUS CORONARY STENT INTERVENTION (PCI-S);  Surgeon: Peter M Martinique, MD;  Location: [facility] CATH LAB;  Service: Cardiovascular;;  prov LAD and mid LAD   PERCUTANEOUS CORONARY STENT INTERVENTION (PCI-S) N/A 02/03/2015   Procedure: PERCUTANEOUS CORONARY STENT INTERVENTION (PCI-S);  Surgeon: Jettie Booze, MD;  Location: [facility] CATH LAB;  Service: Cardiovascular;  Laterality: N/A;   POLYPECTOMY  03/09/2022   Procedure: POLYPECTOMY;  Surgeon: Yetta Flock, MD;  Location: [facility];  Service: Gastroenterology;;   SPERMATOCELECTOMY Right 02/21/2022   Procedure: RIGHT SPERMATOCELECTOMY;  Surgeon: Irine Seal, MD;  Location: [facility];  Service: Urology;  Laterality: Right;   TEE WITHOUT CARDIOVERSION N/A 09/02/2015   Procedure: TRANSESOPHAGEAL ECHOCARDIOGRAM (TEE);  Surgeon: Rexene Alberts, MD;  Location: [facility];  Service: Open Heart Surgery;  Laterality: N/A;   ULTRASOUND GUIDANCE FOR VASCULAR ACCESS Bilateral 08/18/2022   Procedure: ULTRASOUND GUIDANCE FOR VASCULAR ACCESS, BILATERAL FEMORAL ARTERIES;  Surgeon: Waynetta Sandy, MD;  Location: [facility];  Service: Vascular;  Laterality: Bilateral;    Short Social History:  Social History   Tobacco Use   Smoking status: Every Day    Packs/day: 0.50    Years: 35.00    Total pack years: 17.50    Types: Cigarettes    Passive exposure: Never   Smokeless tobacco: Never  Substance Use  Topics   Alcohol use: No    Alcohol/week: 0.0 standard drinks of alcohol    No Known Allergies  Current Outpatient Medications  Medication Sig Dispense Refill   acetaminophen (TYLENOL) 325 MG tablet Take 2 tablets (650 mg total) by mouth every 4 (four) hours as needed for headache or mild pain.     allopurinol (ZYLOPRIM) 300 MG tablet Take 1 tablet (300 mg total) by mouth 2 (two) times daily. 180 tablet 3   apixaban (ELIQUIS) 5 MG TABS tablet Take 1 tablet (5 mg total) by mouth 2 (two) times daily. 60 tablet 11   Ascorbic Acid (VITAMIN C) 1000 MG tablet Take 1,000 mg by mouth daily.     aspirin EC 81 MG tablet Take 81 mg by mouth daily.     atorvastatin (LIPITOR) 80 MG tablet Take 1 tablet (80 mg total) by mouth daily. 90 tablet 3   Calcium Carbonate-Vit D-Min (CALCIUM 1200 PO) Take 1 capsule by mouth daily.     carvedilol (COREG) 6.25 MG tablet Take 1 tablet (6.25 mg total)  by mouth 2 (two) times daily. 180 tablet 3   cholecalciferol (VITAMIN D3) 25 MCG (1000 UNIT) tablet Take 1,000 Units by mouth daily.     colchicine 0.6 MG tablet TAKE 2 TABLETS ONE TIME. THEN TAKE 1 TABLET AFTER ONE HOUR. THEN TAKE 1 TABLET DAILY FOR 5 DAYS. (Patient taking differently: Take 0.6-1.2 mg by mouth See admin instructions. TAKE 2 TABLETS ONE TIME. THEN TAKE 1 TABLET AFTER ONE HOUR. THEN TAKE 1 TABLET DAILY FOR 5 DAYS.) 120 tablet 1   ezetimibe (ZETIA) 10 MG tablet TAKE 1 TABLET BY MOUTH EVERY DAY 90 tablet 3   furosemide (LASIX) 40 MG tablet TAKE 1 TABLET BY MOUTH EVERY DAY (Patient taking differently: Take 40 mg by mouth daily.) 90 tablet 1   isosorbide mononitrate (IMDUR) 30 MG 24 hr tablet Take 1 tablet (30 mg total) by mouth daily. 90 tablet 3   JARDIANCE 10 MG TABS tablet TAKE 1 TABLET BY MOUTH EVERY DAY 90 tablet 1   Magnesium Oxide 200 MG TABS Take 1 tablet (200 mg total) by mouth 2 (two) times daily. (Patient taking differently: Take 200 mg by mouth daily.) 60 tablet 0   Multiple Vitamin (MULTIVITAMIN)  tablet Take 1 tablet by mouth daily.     nicotine (NICODERM CQ - DOSED IN MG/24 HOURS) 21 mg/24hr patch Place 1 patch (21 mg total) onto the skin daily. 28 patch 2   nicotine polacrilex (COMMIT) 4 MG lozenge Take 1 lozenge (4 mg total) by mouth as needed for smoking cessation. 100 tablet 2   nitroGLYCERIN (NITROSTAT) 0.4 MG SL tablet DISSOLVE 1 TABLET UNDER THE TONGUE EVERY 5 MINUTES AS NEEDED FOR CHEST PAIN. MAX 3 DOSES/15 MIN 75 tablet 3   OMEGA-3 FATTY ACIDS PO Take 1,200 mg by mouth daily.     sacubitril-valsartan (ENTRESTO) 24-26 MG Take 1 tablet by mouth 2 (two) times daily. 60 tablet 6   sildenafil (VIAGRA) 50 MG tablet TAKE 1 TABLET BY MOUTH DAILY AS NEEDED FOR ERECTILE DYSFUNCTION. (Patient taking differently: Take 50 mg by mouth daily as needed for erectile dysfunction.) 10 tablet 3   spironolactone (ALDACTONE) 25 MG tablet TAKE 1 TABLET BY MOUTH EVERYDAY AT BEDTIME 90 tablet 3   SYMBICORT 160-4.5 MCG/ACT inhaler INHALE 2 PUFFS INTO THE LUNGS TWICE A DAY (Patient taking differently: Inhale 2 puffs into the lungs 2 (two) times daily.) 10.2 each 6   tamsulosin (FLOMAX) 0.4 MG CAPS capsule Take 0.4 mg by mouth daily.     tiotropium (SPIRIVA) 18 MCG inhalation capsule Place 1 capsule (18 mcg total) into inhaler and inhale daily. 30 capsule 12   traZODone (DESYREL) 50 MG tablet TAKE 0.5-1 TABLETS BY MOUTH AT BEDTIME AS NEEDED FOR SLEEP. (Patient taking differently: Take 25-50 mg by mouth at bedtime as needed for sleep.) 90 tablet 2   No current facility-administered medications for this visit.    Review of Systems  Constitutional:  Constitutional negative. HENT: HENT negative.  Eyes: Eyes negative.  Respiratory: Respiratory negative.  Cardiovascular: Cardiovascular negative.  GI: Gastrointestinal negative.  Musculoskeletal: Positive for back pain.  Skin: Skin negative.  Neurological: Neurological negative. Hematologic: Hematologic/lymphatic negative.  Psychiatric: Psychiatric  negative.        Objective:  Objective  Vitals:   10/04/22 1138  BP: 96/65  Pulse: 68  Resp: 20  Temp: 98 F (36.7 C)  SpO2: 93%     Physical Exam HENT:     Head: Normocephalic.     Nose: Nose normal.  Eyes:  Pupils: Pupils are equal, round, and reactive to light.  Cardiovascular:     Rate and Rhythm: Normal rate.  Pulmonary:     Effort: Pulmonary effort is normal.  Abdominal:     General: Abdomen is flat.     Palpations: Abdomen is soft.  Musculoskeletal:        General: Normal range of motion.     Cervical back: Normal range of motion and neck supple.     Right lower leg: No edema.     Left lower leg: No edema.  Skin:    General: Skin is warm and dry.     Capillary Refill: Capillary refill takes less than 2 seconds.  Neurological:     General: No focal deficit present.     Mental Status: He is alert.  Psychiatric:        Mood and Affect: Mood normal.    Data: CT IMPRESSION: VASCULAR   1. 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 2.  Aortic Atherosclerosis (ICD10-I70.0).   NON-VASCULAR   1. No acute abdominopelvic abnormality. 2. Similar appearing diverticulosis. 3. Similar appearing prostatomegaly. 4. Similar appearing emphysema (ICD10-J43.9).       Assessment/Plan:     66 year old male status post endovascular aneurysm repair with stable type II endoleak.  We again reviewed his CT scan together today.  Plan will be for follow-up in 8 to 9 months with endovascular aneurysm duplex.  We reviewed his lumbar spine on CT today and he would probably benefit from spine surgery evaluation.    Waynetta Sandy MD [facility]

## 2022-10-10 ENCOUNTER — Other Ambulatory Visit: Payer: Self-pay

## 2022-10-10 DIAGNOSIS — I7143 Infrarenal abdominal aortic aneurysm, without rupture: Secondary | ICD-10-CM

## 2022-10-12 ENCOUNTER — Ambulatory Visit (INDEPENDENT_AMBULATORY_CARE_PROVIDER_SITE_OTHER): Payer: Medicare Other

## 2022-10-12 DIAGNOSIS — I255 Ischemic cardiomyopathy: Secondary | ICD-10-CM

## 2022-10-12 LAB — CUP PACEART REMOTE DEVICE CHECK
Date Time Interrogation Session: 20231109080122
Implantable Lead Connection Status: 753985
Implantable Lead Implant Date: 20150515
Implantable Lead Location: 753860
Implantable Lead Model: 365
Implantable Lead Serial Number: 10579264
Implantable Pulse Generator Implant Date: 20150515
Pulse Gen Model: 383594
Pulse Gen Serial Number: 60800912

## 2022-10-20 NOTE — Progress Notes (Signed)
Remote ICD transmission.   

## 2022-11-06 ENCOUNTER — Other Ambulatory Visit: Payer: Self-pay | Admitting: Cardiology

## 2022-11-06 DIAGNOSIS — I255 Ischemic cardiomyopathy: Secondary | ICD-10-CM

## 2022-11-06 DIAGNOSIS — Z95810 Presence of automatic (implantable) cardiac defibrillator: Secondary | ICD-10-CM

## 2022-11-15 ENCOUNTER — Other Ambulatory Visit: Payer: Self-pay | Admitting: Cardiology

## 2022-11-24 ENCOUNTER — Other Ambulatory Visit: Payer: Self-pay | Admitting: Cardiology

## 2022-11-24 DIAGNOSIS — M109 Gout, unspecified: Secondary | ICD-10-CM

## 2022-12-26 ENCOUNTER — Other Ambulatory Visit: Payer: Self-pay | Admitting: Cardiology

## 2022-12-26 NOTE — Telephone Encounter (Signed)
Prescription refill request for Eliquis received. Indication: PAF Last office visit: 08/14/22  T Conte PA-C Scr: 1.10 on 08/19/22 Age: 67 Weight: 98.4kg  Based on above findings Eliquis '5mg'$  twice daily is the appropriate dose.  Refill approved.

## 2023-01-04 ENCOUNTER — Other Ambulatory Visit: Payer: Self-pay | Admitting: Cardiology

## 2023-01-08 ENCOUNTER — Other Ambulatory Visit: Payer: Self-pay | Admitting: Cardiology

## 2023-01-10 LAB — CUP PACEART REMOTE DEVICE CHECK
Battery Voltage: 23
Date Time Interrogation Session: 20240207081316
Implantable Lead Connection Status: 753985
Implantable Lead Implant Date: 20150515
Implantable Lead Location: 753860
Implantable Lead Model: 365
Implantable Lead Serial Number: 10579264
Implantable Pulse Generator Implant Date: 20150515
Pulse Gen Model: 383594
Pulse Gen Serial Number: 60800912

## 2023-01-11 ENCOUNTER — Ambulatory Visit (INDEPENDENT_AMBULATORY_CARE_PROVIDER_SITE_OTHER): Payer: 59

## 2023-01-11 DIAGNOSIS — I255 Ischemic cardiomyopathy: Secondary | ICD-10-CM | POA: Diagnosis not present

## 2023-01-20 ENCOUNTER — Other Ambulatory Visit: Payer: Self-pay | Admitting: Nurse Practitioner

## 2023-01-20 DIAGNOSIS — F5101 Primary insomnia: Secondary | ICD-10-CM

## 2023-02-05 NOTE — Progress Notes (Signed)
Remote ICD transmission.   

## 2023-02-07 ENCOUNTER — Other Ambulatory Visit: Payer: Self-pay | Admitting: Cardiology

## 2023-02-07 ENCOUNTER — Other Ambulatory Visit: Payer: Self-pay | Admitting: Nurse Practitioner

## 2023-02-07 DIAGNOSIS — J449 Chronic obstructive pulmonary disease, unspecified: Secondary | ICD-10-CM

## 2023-02-07 NOTE — Telephone Encounter (Signed)
Can you find out what covered alternatives are? I can change that then.  Thanks  -HB

## 2023-02-08 NOTE — Telephone Encounter (Signed)
Here are other alternatives   Breztri Aerosphere Albuterol Symbicort Combivent

## 2023-02-13 ENCOUNTER — Other Ambulatory Visit: Payer: Self-pay | Admitting: Nurse Practitioner

## 2023-02-13 DIAGNOSIS — J449 Chronic obstructive pulmonary disease, unspecified: Secondary | ICD-10-CM

## 2023-02-13 MED ORDER — BUDESONIDE-FORMOTEROL FUMARATE 160-4.5 MCG/ACT IN AERO: 2.0000 | INHALATION_SPRAY | Freq: Two times a day (BID) | RESPIRATORY_TRACT | 3 refills | Status: DC

## 2023-02-13 NOTE — Telephone Encounter (Signed)
I sent in prescription for symbicort on 02/09/2023. Can you make sure the pharmacy received it? Thank you

## 2023-02-13 NOTE — Telephone Encounter (Signed)
I sent new prescription for symbicort to [facility].

## 2023-02-13 NOTE — Telephone Encounter (Signed)
Called pharmacy the tech stated that nothing was sent over from our office the only thing they had was some other med from another provider

## 2023-03-08 ENCOUNTER — Other Ambulatory Visit: Payer: Self-pay | Admitting: Nurse Practitioner

## 2023-03-08 DIAGNOSIS — M109 Gout, unspecified: Secondary | ICD-10-CM

## 2023-03-23 ENCOUNTER — Other Ambulatory Visit: Payer: Self-pay

## 2023-03-23 ENCOUNTER — Telehealth: Payer: Self-pay | Admitting: Cardiology

## 2023-03-23 DIAGNOSIS — I5022 Chronic systolic (congestive) heart failure: Secondary | ICD-10-CM

## 2023-03-23 MED ORDER — EMPAGLIFLOZIN 10 MG PO TABS: 10.0000 mg | ORAL_TABLET | Freq: Every day | ORAL | 1 refills | Status: DC

## 2023-03-23 NOTE — Telephone Encounter (Signed)
*  STAT* If patient is at the pharmacy, call can be transferred to refill team.   1. Which medications need to be refilled? (please list name of each medication and dose if known)   JARDIANCE 10 MG TABS tablet   2. Which pharmacy/location (including street and city if local pharmacy) is medication to be sent to? [id number] [place], [place] - [address]   3. Do they need a 30 day or 90 day supply? 90 day   Pt out of medication

## 2023-03-23 NOTE — Telephone Encounter (Signed)
Medication has been refilled and sent to his local pharmacy.  Patient has also been schedule to see Dr [place] for an overdue appointment.

## 2023-03-27 ENCOUNTER — Telehealth: Payer: Self-pay

## 2023-03-27 NOTE — Telephone Encounter (Signed)
Called patient to schedule Medicare Annual Wellness Visit (AWV). Unable to reach patient.  Last date of AWV: 01/04/22  Please schedule an appointment at any time on Annual Wellness Visit Schedule.

## 2023-03-30 ENCOUNTER — Other Ambulatory Visit: Payer: Self-pay | Admitting: Nurse Practitioner

## 2023-03-30 DIAGNOSIS — M109 Gout, unspecified: Secondary | ICD-10-CM

## 2023-04-02 ENCOUNTER — Telehealth: Payer: Self-pay

## 2023-04-02 NOTE — Telephone Encounter (Signed)
Called patient to advise expect call from scheduler for sooner apt with PA. Also stressed to pt importance of medication compliance. Pt voiced understanding.

## 2023-04-02 NOTE — Telephone Encounter (Addendum)
Mean PVC/h above limit (> 100 PVC/h) Last value 192 PVC/h measured on Apr 02, 2023, 1:32:30 AM  [place].  Admits to missing his medications (including his beta blocker) for several days, is back on them now.  Says he also fell over the weekend (lost balance) denies any syncope. Otherwise, reports that he is asymptomatic.  His next appointment with Dr. Ladona Ridgel is in August.   BP today:  118/68 I have set re-send transmission for the end of the week to continue to monitor PVC burden now that he is back on his medication.  May need dose change and/or follow up sooner with Dr. Ladona Ridgel in office if not improving.

## 2023-04-02 NOTE — Telephone Encounter (Deleted)
Details for arrhythmia episode received (all types) Details were received for a AT episode, which was detected on Mar 30, 2023, 12:22:00 PM  Appears 15 minutes of Aflutter, a rates 300bpm, Ventricular rates 140's.  Patient states he was at [employer], "reeling in a big fish" about that time.  No symptoms, feels great. He also missed a few days of his medication but is back on meds now.  Will continue to monitor.  He is on Carvedilol and Eliquis.

## 2023-04-08 NOTE — Telephone Encounter (Signed)
Agree. GT 

## 2023-04-12 ENCOUNTER — Ambulatory Visit
Admission: RE | Admit: 2023-04-12 | Discharge: 2023-04-12 | Disposition: A | Discharge status: Home / Self Care | Payer: 59 | Source: Ambulatory Visit | Attending: Family Medicine | Admitting: Family Medicine

## 2023-04-12 ENCOUNTER — Other Ambulatory Visit: Payer: Self-pay | Admitting: Family Medicine

## 2023-04-12 ENCOUNTER — Ambulatory Visit (INDEPENDENT_AMBULATORY_CARE_PROVIDER_SITE_OTHER): Payer: 59

## 2023-04-12 DIAGNOSIS — R0781 Pleurodynia: Secondary | ICD-10-CM

## 2023-04-12 DIAGNOSIS — I255 Ischemic cardiomyopathy: Secondary | ICD-10-CM | POA: Diagnosis not present

## 2023-04-12 DIAGNOSIS — R059 Cough, unspecified: Secondary | ICD-10-CM | POA: Diagnosis not present

## 2023-04-12 DIAGNOSIS — R0602 Shortness of breath: Secondary | ICD-10-CM | POA: Diagnosis not present

## 2023-04-12 LAB — CUP PACEART REMOTE DEVICE CHECK
Battery Voltage: 21
Date Time Interrogation Session: 20240509072700
Implantable Lead Connection Status: 753985
Implantable Lead Implant Date: 20150515
Implantable Lead Location: 753860
Implantable Lead Model: 365
Implantable Lead Serial Number: 10579264
Implantable Pulse Generator Implant Date: 20150515
Pulse Gen Model: 383594
Pulse Gen Serial Number: 60800912

## 2023-04-13 ENCOUNTER — Encounter: Payer: Self-pay | Admitting: Family Medicine

## 2023-04-13 ENCOUNTER — Ambulatory Visit (INDEPENDENT_AMBULATORY_CARE_PROVIDER_SITE_OTHER): Payer: 59 | Admitting: Family Medicine

## 2023-04-13 VITALS — BP 96/60 | HR 63 | Ht 73.0 in | Wt 212.4 lb

## 2023-04-13 DIAGNOSIS — J189 Pneumonia, unspecified organism: Secondary | ICD-10-CM | POA: Diagnosis not present

## 2023-04-13 DIAGNOSIS — R0781 Pleurodynia: Secondary | ICD-10-CM | POA: Diagnosis not present

## 2023-04-13 MED ORDER — ACETAMINOPHEN ER 650 MG PO TBCR: 650.0000 mg | EXTENDED_RELEASE_TABLET | ORAL | 0 refills | Status: AC | PRN

## 2023-04-13 MED ORDER — AMOXICILLIN-POT CLAVULANATE 875-125 MG PO TABS: 1.0000 | ORAL_TABLET | Freq: Two times a day (BID) | ORAL | 0 refills | Status: AC

## 2023-04-13 MED ORDER — OXYCODONE HCL 5 MG PO TABS: 5.0000 mg | ORAL_TABLET | ORAL | 0 refills | Status: DC | PRN

## 2023-04-13 MED ORDER — AZITHROMYCIN 250 MG PO TABS: ORAL_TABLET | ORAL | 0 refills | Status: AC

## 2023-04-13 NOTE — Patient Instructions (Addendum)
DO NOT TAKE MORE THAN 6 TABLETS OF TYLENOL 650MG  IN ONE DAY.  Deep Breathing Step 1: Sit upright in a chair and place your hands over your fractured rib area. You can also hold a pillow to your chest for support. Step 2: Take a deep breath, and slowly and gently fill your lungs.  Step 3: Hold your breath for about 10 seconds. Step 4: Exhale slowly. Step 5: Cough gently to help loosen mucus. You can repeat this process five times, every two hours as comfort allows.  Diaphragmatic Breathing Also known as belly breathing, diaphragmatic breathing helps to pass air to the furthest extent of your lungs and prevent trapped air from causing issues. Step 1: Sit upright in a chair and place one hand on your upper chest and the other on your abdomen. Step 2: Inhale slowly and focus on pushing your stomach into your hand. Try to make sure your upper hand remains motionless.  Step 3: Tighten your stomach muscles as you exhale slowly. You can repeat this exercise five to 10 times, for three to four sequences a day.   Bucket Handle Breathing Another breathing exercise for broken ribs, bucket handle breathing gently works the sides of your ribs to encourage motion for all angles. Step 1: Sitting upright in a chair, place your hands on your sides. This is where your lower rib cage is located. Step 2: Inhale slowly, breathing so your sides push into your hands. Step 3: Hold for 10 seconds and exhale slowly. Repeat this breathing exercise five to 10 times, for roughly three to four sessions each day.

## 2023-04-13 NOTE — Progress Notes (Signed)
Acute Office Visit  Subjective:     Patient ID: Kerry Mason, male    DOB: 12/13/1955, 67 y.o.   MRN: [id number]  Chief Complaint  Patient presents with   Follow-up    HPI Patient is in today for rib pain.  He fell 1 week ago and landed on his left side.  Since that time, he has had pain in his left ribs.  It has been worsening over the course of the week and has been keeping him from sleeping.  He does state that the pain used to cover a larger area and has become more localized, though it still has a high intensity.  He has been taking Tylenol extra strength and icing it with moderate improvement.  Over the past 2 days, he developed a productive cough, initially with clear mucus that has since become a yellow-green color.  Review of Systems  Constitutional:  Negative for chills, fever and malaise/fatigue.  Respiratory:  Positive for cough and sputum production. Negative for wheezing.   Cardiovascular:  Positive for chest pain (Left rib). Negative for palpitations.  Gastrointestinal:  Negative for nausea and vomiting.  Musculoskeletal:  Negative for back pain and neck pain.  Psychiatric/Behavioral:  The patient has insomnia (Due to pain).      Objective:    BP 96/60   Pulse 63   Ht 6\' 1"  (1.854 m)   Wt 212 lb 6.4 oz (96.3 kg)   SpO2 95%   BMI 28.02 kg/m   Physical Exam Constitutional:      General: He is not in acute distress.    Appearance: Normal appearance.  HENT:     Head: Normocephalic and atraumatic.  Cardiovascular:     Rate and Rhythm: Normal rate and regular rhythm.     Pulses: Normal pulses.     Heart sounds: Normal heart sounds. No murmur heard.    No friction rub. No gallop.  Pulmonary:     Effort: Pulmonary effort is normal. No respiratory distress.     Breath sounds: Rhonchi (ULL, entire right lung) present. No wheezing or rales.  Musculoskeletal:        General: Tenderness (Left ribs) present. No swelling. Normal range of motion.     Cervical back:  Normal range of motion.  Skin:    General: Skin is warm and dry.  Neurological:     Mental Status: He is alert and oriented to person, place, and time.  Psychiatric:        Mood and Affect: Mood normal.      Assessment & Plan:  Rib pain on left side -     Acetaminophen ER; Take 1 tablet (650 mg total) by mouth every 4 (four) hours as needed for up to 10 days for pain. Maximum 6 tablets total daily.  Dispense: 60 tablet; Refill: 0 -     oxyCODONE HCl; Take 1 tablet (5 mg total) by mouth every 4 (four) hours as needed for severe pain.  Dispense: 30 tablet; Refill: 0  Pneumonia due to infectious organism, unspecified laterality, unspecified part of lung -     Amoxicillin-Pot Clavulanate; Take 1 tablet by mouth 2 (two) times daily for 5 days.  Dispense: 10 tablet; Refill: 0 -     Azithromycin; Take 2 tablets on day 1, then 1 tablet daily on days 2 through 5  Dispense: 6 tablet; Refill: 0  Patient still has significant rib pain that is preventing him from sleeping.  Recommended continuing to primarily use  Tylenol, provided prescription along with instructions to take a maximum of 6 tablets total each day so as not to exceed the recommended maximum daily dose of 4000 mg of acetaminophen.  Provided oxycodone for breakthrough pain, particularly at night to help him sleep.  Patient has a history of COPD, I have not seen him before and do not know what his baseline physical exam is like.  Still awaiting on radiologist's report on chest x-ray.  I reviewed the images available and compared to prior imaging done 08/18/2022 and 01/03/2021.  Given presentation of new productive cough with history of COPD and recent rib injury, I am concerned for pneumonia.  He endorses that he has not been able to breathe deeply since the injury.  I provided instructions for some deep breathing exercises and encouraged him to complete these while taking pain medication.  Patient with NKDA, sent in 5-day course of Augmentin and  azithromycin to cover for pneumonia to prevent this complication.  Patient verbalized understanding and is agreeable with this plan.  We also discussed that he can discontinue the antibiotics if radiology report returns with no concern for pneumonia.  Return if symptoms worsen or fail to improve.   Melida Quitter, PA

## 2023-05-02 NOTE — Progress Notes (Signed)
Remote ICD transmission.   

## 2023-05-11 ENCOUNTER — Encounter: Payer: Self-pay | Admitting: Physician Assistant

## 2023-05-11 ENCOUNTER — Ambulatory Visit: Payer: 59 | Attending: Internal Medicine | Admitting: Physician Assistant

## 2023-05-11 VITALS — BP 96/62 | HR 3 | Ht 73.0 in | Wt 216.8 lb

## 2023-05-11 DIAGNOSIS — I480 Paroxysmal atrial fibrillation: Secondary | ICD-10-CM

## 2023-05-11 DIAGNOSIS — Z79899 Other long term (current) drug therapy: Secondary | ICD-10-CM | POA: Diagnosis not present

## 2023-05-11 DIAGNOSIS — I2510 Atherosclerotic heart disease of native coronary artery without angina pectoris: Secondary | ICD-10-CM | POA: Diagnosis not present

## 2023-05-11 DIAGNOSIS — D6869 Other thrombophilia: Secondary | ICD-10-CM

## 2023-05-11 DIAGNOSIS — I255 Ischemic cardiomyopathy: Secondary | ICD-10-CM | POA: Diagnosis not present

## 2023-05-11 DIAGNOSIS — I5022 Chronic systolic (congestive) heart failure: Secondary | ICD-10-CM

## 2023-05-11 DIAGNOSIS — Z95810 Presence of automatic (implantable) cardiac defibrillator: Secondary | ICD-10-CM

## 2023-05-11 DIAGNOSIS — I493 Ventricular premature depolarization: Secondary | ICD-10-CM | POA: Diagnosis not present

## 2023-05-11 LAB — CUP PACEART INCLINIC DEVICE CHECK
Date Time Interrogation Session: 20240607201954
Implantable Lead Connection Status: 753985
Implantable Lead Implant Date: 20150515
Implantable Lead Location: 753860
Implantable Lead Model: 365
Implantable Lead Serial Number: 10579264
Implantable Pulse Generator Implant Date: 20150515
Lead Channel Pacing Threshold Amplitude: 0.3 V
Lead Channel Pacing Threshold Pulse Width: 0.4 ms
Lead Channel Sensing Intrinsic Amplitude: 10.2 mV
Lead Channel Sensing Intrinsic Amplitude: 24.1 mV
Pulse Gen Model: 383594
Pulse Gen Serial Number: 60800912

## 2023-05-11 NOTE — Progress Notes (Signed)
Cardiology Office Note Date:  05/11/2023  Patient ID:  Kerry Mason, Kerry Mason March 04, 1956, MRN [id number] PCP:  Carlean Jews, NP  Cardiologist:  Dr. Swaziland Electrophysiologist: Dr. Ladona Ridgel     Chief Complaint: annual visit  History of Present Illness: Kerry Mason is a 67 y.o. male with history of CAD (remote CABG/PCIs, 2016 was a CTO procedure RCA complicated by tamponade, on 01/04/21, unsuccessful PCI of ostial Cx  ), ICM,. Chronic CHF (systolic), ICD, COPD/emphysema, HTN, HLD, AAA (s/p endograft repair 08/18/22), AFib  He saw Dr. Ladona Ridgel 4/7/243, no device, arrhythmia issues, doing OK< class II symptoms, encouraged to stop smoking, no changes were made.  Most recently saw T. Asa Lente, [facility] 08/14/22, via telephone visit for pre-op, pending abdominal aortic endovascular stent graft, felt a reasonable candidate, doing well, averaging about 1000 steps a day including steps, housework, no exertional symptoms/incapacities reported   08/18/22 S/p endovascular aortic aneurysm repair with main body right 26 x 14 x 12 extended with 16 x 12 via 16 French sheath and contralateral limb left 20 x 14 via 12 [place] sheath ironed out with M OB balloon.   04/02/23, device clinic alert for increased PVC burden, given an appt to come in. Pt reported no symptoms, had been on vacation at the beach, had a trip/fall  04/13/23, saw his PMD service for L sided rib pain post fall as well as a cough, given pain management, started on amoxicillin and zpack given his COPD/emphysema hx  TODAY He feels well. When he was called about his remote, he was on vacation at the beach, had fallen and was pretty sore, was fishing and pretty physically active. Once back saw his PMD, also treated for PNA, and given pain management He is still sore but better  He reports that he was standing up in the ledge of the open car/truck door adjusting his fishing poles on the roof and lost his footing and fell, was not dizzy, no  syncope.  He denies CP, palpitations or any kind of cardiac awareness No unusual SOB< he has had for quite some time some degree or DOE with heavier activities No near syncope or syncope. No bleeding or signs of bleeding   Device information Biotronik single chamber ICD implanted 04/17/14   Past Medical History:  Diagnosis Date   AAA (abdominal aortic aneurysm) (HCC)    Acute systolic CHF (congestive heart failure) (HCC)    AICD (automatic cardioverter/defibrillator) present    Anxiety    Ascending aortic aneurysm (HCC) 08/31/2017   Back pain 08/31/2017   Basal cell carcinoma    left arm   CAD (coronary artery disease)    a. 2000: s/p stent of RCA 2000 with BMS  b. 2015: STEMI s/p LHC with old occlusion of RCA and DESx2 to LAD  c. 02/03/15: complicated PCI on 3/2 for CTO of mid RCA with coronary perforation and cardiac tamponade requiring emergent pericardiocentesis      Cardiac tamponade    a. 02/03/15 2/2 coronary perforation during CTO procedure. Sealed with graftmaster coated stent.    COPD (chronic obstructive pulmonary disease) (HCC) 08/31/2017   Gout    History of pneumonia    HTN (hypertension)    Hyperlipidemia    Ischemic cardiomyopathy    a. 2D ECHO: EF 25-30%. 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   LIMA to  LAD, SVG to ramus intermediate branch, EVH via right thigh    Shortness of breath dyspnea    Tobacco abuse    Urinary frequency     Past Surgical History:  Procedure Laterality Date   ABDOMINAL AORTIC ENDOVASCULAR STENT GRAFT N/A 08/18/2022   Procedure: ABDOMINAL AORTIC ENDOVASCULAR STENT GRAFT;  Surgeon: Maeola Harman, MD;  Location: [facility] OR;  Service: Vascular;  Laterality: N/A;   CARDIAC CATHETERIZATION     CARDIAC CATHETERIZATION N/A 06/14/2015   Procedure: Left Heart Cath and Coronary Angiography;  Surgeon: Runell Gess, MD;  Location: [facility]  INVASIVE CV LAB;  Service: Cardiovascular;  Laterality: N/A;   CARDIAC CATHETERIZATION N/A 08/18/2015   Procedure: Intravascular Pressure Wire/FFR Study;  Surgeon: Peter M Swaziland, MD;  Location: [facility] INVASIVE CV LAB;  Service: Cardiovascular;  Laterality: N/A;   COLONOSCOPY     COLONOSCOPY WITH PROPOFOL N/A 03/09/2022   Procedure: COLONOSCOPY WITH PROPOFOL;  Surgeon: Benancio Deeds, MD;  Location: [facility] ENDOSCOPY;  Service: Gastroenterology;  Laterality: N/A;   CORONARY ANGIOPLASTY  2000   CORONARY ANGIOPLASTY WITH STENT PLACEMENT  2015   CORONARY ARTERY BYPASS GRAFT N/A 09/02/2015   Procedure: CORONARY ARTERY BYPASS GRAFTING (CABG) x 2 (LIMA-LAD, SVG-Intermediate) ENDOSCOPIC GREATER SAPHENOUS VEIN HARVEST RIGHT THIGH;  Surgeon: Purcell Nails, MD;  Location: [facility] OR;  Service: Open Heart Surgery;  Laterality: N/A;   CORONARY BALLOON ANGIOPLASTY N/A 01/04/2021   Procedure: CORONARY BALLOON ANGIOPLASTY;  Surgeon: Corky Crafts, MD;  Location: [facility] INVASIVE CV LAB;  Service: Cardiovascular;  Laterality: N/A;   FOOT SURGERY     IMPLANTABLE CARDIOVERTER DEFIBRILLATOR IMPLANT N/A 04/17/2014   Procedure: IMPLANTABLE CARDIOVERTER DEFIBRILLATOR IMPLANT;  Surgeon: Marinus Maw, MD;  Location: [facility] CATH LAB;  Service: Cardiovascular;  Laterality: N/A;   LEFT HEART CATH AND CORONARY ANGIOGRAPHY N/A 01/04/2021   Procedure: LEFT HEART CATH AND CORONARY ANGIOGRAPHY;  Surgeon: Corky Crafts, MD;  Location: [facility] INVASIVE CV LAB;  Service: Cardiovascular;  Laterality: N/A;   LEFT HEART CATH AND CORS/GRAFTS ANGIOGRAPHY N/A 08/30/2017   Procedure: LEFT HEART CATH AND CORS/GRAFTS ANGIOGRAPHY;  Surgeon: Yvonne Kendall, MD;  Location: [facility] INVASIVE CV LAB;  Service: Cardiovascular;  Laterality: N/A;   LEFT HEART CATHETERIZATION WITH CORONARY ANGIOGRAM N/A 12/11/2013   Procedure: LEFT HEART CATHETERIZATION WITH CORONARY ANGIOGRAM;  Surgeon: Peter M Swaziland, MD;  Location: [facility] CATH LAB;  Service: Cardiovascular;   Laterality: N/A;   LEFT HEART CATHETERIZATION WITH CORONARY ANGIOGRAM N/A 01/05/2015   Procedure: LEFT HEART CATHETERIZATION WITH CORONARY ANGIOGRAM;  Surgeon: Peter M Swaziland, MD;  Location: [facility] CATH LAB;  Service: Cardiovascular;  Laterality: N/A;   PERCUTANEOUS CORONARY STENT INTERVENTION (PCI-S)  12/11/2013   Procedure: PERCUTANEOUS CORONARY STENT INTERVENTION (PCI-S);  Surgeon: Peter M Swaziland, MD;  Location: [facility] CATH LAB;  Service: Cardiovascular;;  prov LAD and mid LAD   PERCUTANEOUS CORONARY STENT INTERVENTION (PCI-S) N/A 02/03/2015   Procedure: PERCUTANEOUS CORONARY STENT INTERVENTION (PCI-S);  Surgeon: Corky Crafts, MD;  Location: [facility] CATH LAB;  Service: Cardiovascular;  Laterality: N/A;   POLYPECTOMY  03/09/2022   Procedure: POLYPECTOMY;  Surgeon: Benancio Deeds, MD;  Location: [facility] ENDOSCOPY;  Service: Gastroenterology;;   SPERMATOCELECTOMY Right 02/21/2022   Procedure: RIGHT SPERMATOCELECTOMY;  Surgeon: Bjorn Pippin, MD;  Location: [facility] ORS;  Service: Urology;  Laterality: Right;   TEE WITHOUT CARDIOVERSION N/A 09/02/2015   Procedure: TRANSESOPHAGEAL ECHOCARDIOGRAM (TEE);  Surgeon: Purcell Nails, MD;  Location: [facility] OR;  Service: Open Heart Surgery;  Laterality: N/A;  ULTRASOUND GUIDANCE FOR VASCULAR ACCESS Bilateral 08/18/2022   Procedure: ULTRASOUND GUIDANCE FOR VASCULAR ACCESS, BILATERAL FEMORAL ARTERIES;  Surgeon: Maeola Harman, MD;  Location: [facility] OR;  Service: Vascular;  Laterality: Bilateral;    Current Outpatient Medications  Medication Sig Dispense Refill   allopurinol (ZYLOPRIM) 300 MG tablet TAKE 1 TABLET BY MOUTH TWICE A DAY 180 tablet 1   apixaban (ELIQUIS) 5 MG TABS tablet TAKE 1 TABLET BY MOUTH TWICE A DAY 60 tablet 5   Ascorbic Acid (VITAMIN C) 1000 MG tablet Take 1,000 mg by mouth daily.     aspirin EC 81 MG tablet Take 81 mg by mouth daily.     atorvastatin (LIPITOR) 80 MG tablet TAKE 1 TABLET BY MOUTH EVERY DAY 90 tablet 3   budesonide-formoterol (SYMBICORT)  160-4.5 MCG/ACT inhaler Inhale 2 puffs into the lungs in the morning and at bedtime. 10.2 each 3   Calcium Carbonate-Vit D-Min (CALCIUM 1200 PO) Take 1 capsule by mouth daily.     carvedilol (COREG) 6.25 MG tablet TAKE 1 TABLET BY MOUTH TWICE A DAY 180 tablet 3   cholecalciferol (VITAMIN D3) 25 MCG (1000 UNIT) tablet Take 1,000 Units by mouth daily.     colchicine 0.6 MG tablet TAKE 2 TABLETS ONE TIME. THEN TAKE 1 TABLET AFTER ONE HOUR. THEN TAKE 1 TABLET DAILY FOR 5 DAYS. (Patient taking differently: Take 0.6-1.2 mg by mouth See admin instructions. TAKE 2 TABLETS ONE TIME. THEN TAKE 1 TABLET AFTER ONE HOUR. THEN TAKE 1 TABLET DAILY FOR 5 DAYS.) 120 tablet 1   empagliflozin (JARDIANCE) 10 MG TABS tablet Take 1 tablet (10 mg total) by mouth daily. 90 tablet 1   ezetimibe (ZETIA) 10 MG tablet TAKE 1 TABLET BY MOUTH EVERY DAY 90 tablet 3   furosemide (LASIX) 40 MG tablet Take 1 tablet (40 mg total) by mouth daily. PATIENT MUST KEEP SCHEDULED ANNUAL OFFICE VISIT FOR FUTURE REFILLS 90 tablet 0   isosorbide mononitrate (IMDUR) 30 MG 24 hr tablet TAKE 1 TABLET BY MOUTH EVERY DAY 90 tablet 3   Magnesium Oxide 200 MG TABS Take 1 tablet (200 mg total) by mouth 2 (two) times daily. (Patient taking differently: Take 200 mg by mouth daily.) 60 tablet 0   Multiple Vitamin (MULTIVITAMIN) tablet Take 1 tablet by mouth daily.     nicotine (NICODERM CQ - DOSED IN MG/24 HOURS) 21 mg/24hr patch Place 1 patch (21 mg total) onto the skin daily. 28 patch 2   nicotine polacrilex (COMMIT) 4 MG lozenge Take 1 lozenge (4 mg total) by mouth as needed for smoking cessation. 100 tablet 2   nitroGLYCERIN (NITROSTAT) 0.4 MG SL tablet DISSOLVE 1 TABLET UNDER THE TONGUE EVERY 5 MINUTES AS NEEDED FOR CHEST PAIN. MAX 3 DOSES/15 MIN 75 tablet 3   OMEGA-3 FATTY ACIDS PO Take 1,200 mg by mouth daily.     oxyCODONE (OXY IR/ROXICODONE) 5 MG immediate release tablet Take 1 tablet (5 mg total) by mouth every 4 (four) hours as needed for severe  pain. 30 tablet 0   sacubitril-valsartan (ENTRESTO) 24-26 MG TAKE 1 TABLET BY MOUTH TWICE A DAY 180 tablet 3   sildenafil (VIAGRA) 50 MG tablet TAKE 1 TABLET BY MOUTH DAILY AS NEEDED FOR ERECTILE DYSFUNCTION. (Patient taking differently: Take 50 mg by mouth daily as needed for erectile dysfunction.) 10 tablet 3   spironolactone (ALDACTONE) 25 MG tablet TAKE 1 TABLET BY MOUTH EVERYDAY AT BEDTIME 90 tablet 3   tamsulosin (FLOMAX) 0.4 MG CAPS capsule Take  0.4 mg by mouth daily.     tiotropium (SPIRIVA) 18 MCG inhalation capsule INHALE 1 CAPSULE VIA HANDIHALER ONCE DAILY AT THE SAME TIME EVERY DAY 30 capsule 2   traZODone (DESYREL) 50 MG tablet TAKE 0.5-1 TABLETS BY MOUTH AT BEDTIME AS NEEDED FOR SLEEP. (Patient taking differently: Take 25-50 mg by mouth at bedtime as needed for sleep.) 90 tablet 2   No current facility-administered medications for this visit.    Allergies:   Patient has no known allergies.   Social History:  The patient  reports that he has been smoking cigarettes. He has a 17.50 pack-year smoking history. He has never been exposed to tobacco smoke. He has never used smokeless tobacco. He reports that he does not drink alcohol and does not use drugs.   Family History:  The patient's family history includes CAD in his father; Cancer in his mother.  ROS:  Please see the history of present illness.    All other systems are reviewed and otherwise negative.   PHYSICAL EXAM:  VS:  BP 96/62   Pulse (!) 3   Ht 6\' 1"  (1.854 m)   Wt 216 lb 12.8 oz (98.3 kg)   SpO2 96%   BMI 28.60 kg/m  BMI: Body mass index is 28.6 kg/m. Well nourished, well developed, in no acute distress HEENT: normocephalic, atraumatic Neck: no JVD, carotid bruits or masses Cardiac:   RRR; no extrasystoles, no significant murmurs, no rubs, or gallops Lungs:   CTA b/l, no wheezing, rhonchi or rales Abd: soft, nontender MS: no deformity or atrophy Ext:  no edema Skin: warm and dry, no rash Neuro:  No gross  deficits appreciated Psych: euthymic mood, full affect  ICD site (R side) is stable, no tethering or discomfort   EKG:  Done today and reviewed by myself shows  SR 73bpm, poor ant R progression, lat T changes unchanged from prior  Device interrogation done today and reviewed by myself:  Battery is as 21%/MOS2 Lead measurements stable No VT PVC burden is back down to 1% No VP No AF No VT   01/04/21: LHC/failed attempt to PCI LM lesion is 50% stenosed. Patent LIMA to LAD. Patent SVG to ramus. 2nd Diag lesion is 70% stenosed. Mid RCA to Dist RCA lesion is 100% stenosed. Prox RCA to Mid RCA lesion is 100% stenosed. Ost RCA to Prox RCA lesion is 95% stenosed. Ost Cx lesion is 80% stenosed. This territory is not bypassed, but it is a relatively small vessel. Unable to advance balloon after successfully guiding the wire though the ramus graft and retrograde to the ostial circumflex and across the lesion. The left ventricular ejection fraction is 25-35% by visual estimate. There is moderate to severe left ventricular systolic dysfunction. LV end diastolic pressure is normal. There is no aortic valve stenosis.   Medical therapy for CAD.  Unsuccessful attempt at PCI of ostial circumflex.  Restart IV heparin 8 hours post sheath pull.  OK to start DOAC tomorrow for atrial fibrillation.  He was loaded with Plavix but will not continue if he is starting a DOAC.  If he is not starting a DOAC, DAPT would be reasonable for his CAD.    01/04/21: TTE 1. There is no left ventricular thrombus (Definity contrast was used).  There is apical dyskinesis. There is severe inferior wall hypokinesis and  inferoseptal akinesis.. Left ventricular ejection fraction, by estimation,  is 25 to 30%. The left ventricle  has severely decreased function. The left ventricle  demonstrates regional  wall motion abnormalities (see scoring diagram/findings for description).  The left ventricular internal cavity size was  moderately to severely  dilated. Left ventricular diastolic  parameters are consistent with Grade I diastolic dysfunction (impaired  relaxation).   2. Right ventricular systolic function is normal. The right ventricular  size is normal. There is normal pulmonary artery systolic pressure. The  estimated right ventricular systolic pressure is 23.2 mmHg.   3. Left atrial size was severely dilated.   4. The mitral valve is normal in structure. Trivial mitral valve  regurgitation.   5. The aortic valve is tricuspid. There is mild calcification of the  aortic valve. There is mild thickening of the aortic valve. Aortic valve  regurgitation is not visualized. Mild aortic valve sclerosis is present,  with no evidence of aortic valve  stenosis.   6. Aortic dilatation noted. There is mild dilatation of the aortic root,  measuring 39 mm.   7. The inferior vena cava is normal in size with greater than 50%  respiratory variability, suggesting right atrial pressure of 3 mmHg.    Recent Labs: 08/10/2022: ALT 14 08/18/2022: Magnesium 2.0 08/19/2022: BUN 28; Creatinine, Ser 1.10; Hemoglobin 13.2; Platelets 230; Potassium 3.7; Sodium 134  No results found for requested labs within last 365 days.   CrCl cannot be calculated (Patient's most recent lab result is older than the maximum 21 days allowed.).   Wt Readings from Last 3 Encounters:  05/11/23 216 lb 12.8 oz (98.3 kg)  04/13/23 212 lb 6.4 oz (96.3 kg)  10/04/22 217 lb (98.4 kg)     Other studies reviewed: Additional studies/records reviewed today include: summarized above  ASSESSMENT AND PLAN:  ICD Intact function No programming changes made  ICM Chronic CHF (systolic) LVEF 20-25% No symptoms or exam findings of volume OL On BB, Entresto, spiro, furosemide, Jardiance C/w Dr. Anola Gurney Labs today  CAD No symptoms On ASA, BB statin, nitrate  5. AAA repaired Following with vasc  6. PVCs Perhaps provoked by pain,  PNA Improved Labs/lytes today Continue coreg  7. Paroxysmal Afib CHA2DS2Vasc is 4, on Eliquis, appropriately dosed Zero % burden  8. Secondary hypercoagulable state    Disposition: F/u with remotes as usual, in clinic in a year again with EP, sooner if needed  Current medicines are reviewed at length with the patient today.  The patient did not have any concerns regarding medicines.  Norma Fredrickson, PA-C 05/11/2023 5:22 PM     [facility] [address] [place] [address] [phone] (office)  [phone] (fax)

## 2023-05-11 NOTE — Patient Instructions (Signed)
Medication Instructions:   Your physician recommends that you continue on your current medications as directed. Please refer to the Current Medication list given to you today.   *If you need a refill on your cardiac medications before your next appointment, please call your pharmacy*   Lab Work:  BMET  MAG AND CBC TODAY   If you have labs (blood work) drawn today and your tests are completely normal, you will receive your results only by: MyChart Message (if you have MyChart) OR A paper copy in the mail If you have any lab test that is abnormal or we need to change your treatment, we will call you to review the results.   Testing/Procedures:  NONE ORDERED  TODAY     Follow-Up: At [facility], you and your health needs are our priority.  As part of our continuing mission to provide you with exceptional heart care, we have created designated Provider Care Teams.  These Care Teams include your primary Cardiologist (physician) and Advanced Practice Providers (APPs -  Physician Assistants and Nurse Practitioners) who all work together to provide you with the care you need, when you need it.  We recommend signing up for the patient portal called "MyChart".  Sign up information is provided on this After Visit Summary.  MyChart is used to connect with patients for Virtual Visits (Telemedicine).  Patients are able to view lab/test results, encounter notes, upcoming appointments, etc.  Non-urgent messages can be sent to your provider as well.   To learn more about what you can do with MyChart, go to [email or web address].    Your next appointment:   1 year(s)  Provider:   Lewayne Bunting, MD    Other Instructions

## 2023-05-12 LAB — BASIC METABOLIC PANEL
BUN/Creatinine Ratio: 18 (ref 10–24)
BUN: 18 mg/dL (ref 8–27)
CO2: 22 mmol/L (ref 20–29)
Calcium: 9.4 mg/dL (ref 8.6–10.2)
Chloride: 102 mmol/L (ref 96–106)
Creatinine, Ser: 0.98 mg/dL (ref 0.76–1.27)
Glucose: 76 mg/dL (ref 70–99)
Potassium: 4.5 mmol/L (ref 3.5–5.2)
Sodium: 139 mmol/L (ref 134–144)
eGFR: 85 mL/min/{1.73_m2} (ref >59)

## 2023-05-12 LAB — CBC
Hematocrit: 44.6 % (ref 37.5–51.0)
Hemoglobin: 14.9 g/dL (ref 13.0–17.7)
MCH: 30.8 pg (ref 26.6–33.0)
MCHC: 33.4 g/dL (ref 31.5–35.7)
MCV: 92 fL (ref 79–97)
Platelets: 316 10*3/uL (ref 150–450)
RBC: 4.84 x10E6/uL (ref 4.14–5.80)
RDW: 15.2 % (ref 11.6–15.4)
WBC: 13.2 10*3/uL — ABNORMAL HIGH (ref 3.4–10.8)

## 2023-05-12 LAB — MAGNESIUM: Magnesium: 2.2 mg/dL (ref 1.6–2.3)

## 2023-05-23 ENCOUNTER — Other Ambulatory Visit: Payer: Self-pay | Admitting: Nurse Practitioner

## 2023-05-23 DIAGNOSIS — E875 Hyperkalemia: Secondary | ICD-10-CM

## 2023-05-23 DIAGNOSIS — D72829 Elevated white blood cell count, unspecified: Secondary | ICD-10-CM

## 2023-05-23 DIAGNOSIS — E785 Hyperlipidemia, unspecified: Secondary | ICD-10-CM

## 2023-05-23 DIAGNOSIS — Z0000 Encounter for general adult medical examination without abnormal findings: Secondary | ICD-10-CM

## 2023-05-28 ENCOUNTER — Other Ambulatory Visit: Payer: 59

## 2023-05-29 ENCOUNTER — Other Ambulatory Visit: Payer: 59

## 2023-05-29 DIAGNOSIS — D72829 Elevated white blood cell count, unspecified: Secondary | ICD-10-CM | POA: Diagnosis not present

## 2023-05-29 DIAGNOSIS — Z0000 Encounter for general adult medical examination without abnormal findings: Secondary | ICD-10-CM

## 2023-05-29 DIAGNOSIS — R7301 Impaired fasting glucose: Secondary | ICD-10-CM | POA: Diagnosis not present

## 2023-05-29 DIAGNOSIS — E785 Hyperlipidemia, unspecified: Secondary | ICD-10-CM

## 2023-05-29 DIAGNOSIS — E875 Hyperkalemia: Secondary | ICD-10-CM | POA: Diagnosis not present

## 2023-05-30 LAB — HEPATIC FUNCTION PANEL
Bilirubin, Direct: 0.13 mg/dL (ref 0.00–0.40)
Total Protein: 7.5 g/dL (ref 6.0–8.5)

## 2023-05-30 LAB — LIPID PANEL
HDL: 34 mg/dL — ABNORMAL LOW (ref >39)
LDL Chol Calc (NIH): 46 mg/dL (ref 0–99)

## 2023-05-30 LAB — CBC WITH DIFFERENTIAL/PLATELET: MCH: 30.9 pg (ref 26.6–33.0)

## 2023-05-31 LAB — HEPATIC FUNCTION PANEL
ALT: 15 IU/L (ref 0–44)
AST: 21 IU/L (ref 0–40)
Albumin: 4.2 g/dL (ref 3.9–4.9)
Alkaline Phosphatase: 100 IU/L (ref 44–121)
Bilirubin Total: 0.4 mg/dL (ref 0.0–1.2)

## 2023-05-31 LAB — CBC WITH DIFFERENTIAL/PLATELET
Basophils Absolute: 0.1 10*3/uL (ref 0.0–0.2)
Basos: 1 %
EOS (ABSOLUTE): 0.5 10*3/uL — ABNORMAL HIGH (ref 0.0–0.4)
Eos: 5 %
Hematocrit: 45.1 % (ref 37.5–51.0)
Hemoglobin: 15.4 g/dL (ref 13.0–17.7)
Immature Grans (Abs): 0 10*3/uL (ref 0.0–0.1)
Immature Granulocytes: 0 %
Lymphocytes Absolute: 4.2 10*3/uL — ABNORMAL HIGH (ref 0.7–3.1)
Lymphs: 42 %
MCHC: 34.1 g/dL (ref 31.5–35.7)
MCV: 91 fL (ref 79–97)
Monocytes Absolute: 0.5 10*3/uL (ref 0.1–0.9)
Monocytes: 5 %
Neutrophils Absolute: 4.7 10*3/uL (ref 1.4–7.0)
Neutrophils: 47 %
Platelets: 295 10*3/uL (ref 150–450)
RBC: 4.98 x10E6/uL (ref 4.14–5.80)
RDW: 15 % (ref 11.6–15.4)
WBC: 10.1 10*3/uL (ref 3.4–10.8)

## 2023-05-31 LAB — HEMOGLOBIN A1C
Est. average glucose Bld gHb Est-mCnc: 120 mg/dL
Hgb A1c MFr Bld: 5.8 % — ABNORMAL HIGH (ref 4.8–5.6)

## 2023-05-31 LAB — LIPID PANEL
Chol/HDL Ratio: 2.9 ratio (ref 0.0–5.0)
Cholesterol, Total: 99 mg/dL — ABNORMAL LOW (ref 100–199)
Triglycerides: 100 mg/dL (ref 0–149)
VLDL Cholesterol Cal: 19 mg/dL (ref 5–40)

## 2023-06-01 ENCOUNTER — Telehealth: Payer: Self-pay

## 2023-06-01 ENCOUNTER — Ambulatory Visit (INDEPENDENT_AMBULATORY_CARE_PROVIDER_SITE_OTHER): Payer: 59 | Admitting: Family Medicine

## 2023-06-01 ENCOUNTER — Encounter: Payer: Self-pay | Admitting: Family Medicine

## 2023-06-01 VITALS — BP 98/70 | HR 73 | Temp 97.6°F | Ht 73.0 in | Wt 222.0 lb

## 2023-06-01 DIAGNOSIS — Z720 Tobacco use: Secondary | ICD-10-CM

## 2023-06-01 DIAGNOSIS — F5101 Primary insomnia: Secondary | ICD-10-CM | POA: Diagnosis not present

## 2023-06-01 DIAGNOSIS — N529 Male erectile dysfunction, unspecified: Secondary | ICD-10-CM | POA: Diagnosis not present

## 2023-06-01 DIAGNOSIS — M109 Gout, unspecified: Secondary | ICD-10-CM | POA: Diagnosis not present

## 2023-06-01 MED ORDER — SILDENAFIL CITRATE 50 MG PO TABS: 50.0000 mg | ORAL_TABLET | Freq: Every day | ORAL | 3 refills | Status: DC | PRN

## 2023-06-01 MED ORDER — VARENICLINE TARTRATE (STARTER) 0.5 MG X 11 & 1 MG X 42 PO TBPK: ORAL_TABLET | ORAL | 0 refills | Status: DC

## 2023-06-01 MED ORDER — COLCHICINE 0.6 MG PO TABS
0.6000 mg | ORAL_TABLET | ORAL | Status: AC
Start: 2023-06-01 — End: ?

## 2023-06-01 MED ORDER — TRAZODONE HCL 100 MG PO TABS
100.0000 mg | ORAL_TABLET | Freq: Every evening | ORAL | 0 refills | Status: DC | PRN
Start: 2023-06-01 — End: 2024-07-17

## 2023-06-01 NOTE — Patient Instructions (Addendum)
It was nice to see you today,  We addressed the following topics today: - I have prescribed chantix for you to help you quit smoking.   - I have refilled your viagra. Be careful when taking this with your imdur as you can develop hypotension.  You have tolerated these medications together in the past but If you develop dizziness when taking this medication check your blood pressure.  If it is low please let a medical provider know.   - I have sent in a new prescription for 100mg  of trazadone to use as needed for sleep.   - follow up in one month for smoking cessation and insomnia issues.   Have a great day,  Frederic Jericho, MD

## 2023-06-01 NOTE — Assessment & Plan Note (Signed)
Sent in refill for viagra.  Pt also taking imdur.  Advised pt on risks of taking these two medications concurrently including hypotension.  Advised pt to take bp at home if he feels dizzy/lightheaded and alert medical provider if hypotensive.  Pt has been on both of these medications in the past going back several years without issue.

## 2023-06-01 NOTE — Telephone Encounter (Signed)
Pt received an alert that sildenafil (VIAGRA) 50 MG tablet  is not covered by insurance. Pt is wanting another Rx sent in that is covered by insurance

## 2023-06-01 NOTE — Assessment & Plan Note (Signed)
Pt expressed desire to quit smoking.  Discussed trying chantix again.  Pt would prefer this over welbutrin or other medication.  Sent in chantix rx.  Advised pt to use nicotine lozenges prn for cravings. Counseled on smoking cessation.

## 2023-06-01 NOTE — Assessment & Plan Note (Signed)
Resend as 100mg  at bedtime prn.

## 2023-06-01 NOTE — Progress Notes (Signed)
Complete physical exam  Patient: Kerry Mason   DOB: 03/28/1956   67 y.o. Male  MRN: [id number]  Subjective:    Chief Complaint  Patient presents with   Annual Exam    Kerry Mason is a 67 y.o. male who presents today for a complete physical exam. He reports consuming a low sodium diet.  Walking 40981 steps.   He generally feels well. He reports sleeping well. He does have additional problems to discuss today.   Tobacco use-patient smokes a half a pack per day for many years.  Has quit in the past, most longest for 5 years.  Has tried Chantix and patches before.  Wishes to quit smoking.  Would like to try Chantix again.  No alcohol use.  Patient wishes to have refill on his Viagra.  Patient states it was originally prescribed by his cardiologist.  Has not used it since January.  Discussed with the patient side effects including low blood pressure.  Cautioned about use with his Imdur  Patient states that his previous medication for insomnia, the trazodone 50 mg, was not effective.  He did not try the higher dose.  Has not tried the medication in some time.  Thinks he threw the bottle out at some point.  Discussed dosages and the possibility of increasing the dose.  Patient open to increasing the dose.    Most recent fall risk assessment:    06/01/2023    9:36 AM  Fall Risk   Falls in the past year? 1  Number falls in past yr: 0  Injury with Fall? 1  Risk for fall due to : History of fall(s)  Follow up Falls evaluation completed     Most recent depression screenings:    06/01/2023    9:36 AM 03/29/2022   10:20 AM  PHQ 2/9 Scores  PHQ - 2 Score 0 0  PHQ- 9 Score 5 2      Patient Care Team: Sandre Kitty, MD as PCP - General (Family Medicine) [place], Peter M, MD as PCP - Cardiology (Cardiology)   Outpatient Medications Prior to Visit  Medication Sig   allopurinol (ZYLOPRIM) 300 MG tablet TAKE 1 TABLET BY MOUTH TWICE A DAY   apixaban (ELIQUIS) 5 MG TABS tablet TAKE  1 TABLET BY MOUTH TWICE A DAY   Ascorbic Acid (VITAMIN C) 1000 MG tablet Take 1,000 mg by mouth daily.   aspirin EC 81 MG tablet Take 81 mg by mouth daily.   atorvastatin (LIPITOR) 80 MG tablet TAKE 1 TABLET BY MOUTH EVERY DAY   budesonide-formoterol (SYMBICORT) 160-4.5 MCG/ACT inhaler Inhale 2 puffs into the lungs in the morning and at bedtime.   Calcium Carbonate-Vit D-Min (CALCIUM 1200 PO) Take 1 capsule by mouth daily.   carvedilol (COREG) 6.25 MG tablet TAKE 1 TABLET BY MOUTH TWICE A DAY   cholecalciferol (VITAMIN D3) 25 MCG (1000 UNIT) tablet Take 1,000 Units by mouth daily.   colchicine 0.6 MG tablet TAKE 2 TABLETS ONE TIME. THEN TAKE 1 TABLET AFTER ONE HOUR. THEN TAKE 1 TABLET DAILY FOR 5 DAYS. (Patient taking differently: Take 0.6-1.2 mg by mouth See admin instructions. TAKE 2 TABLETS ONE TIME. THEN TAKE 1 TABLET AFTER ONE HOUR. THEN TAKE 1 TABLET DAILY FOR 5 DAYS.)   empagliflozin (JARDIANCE) 10 MG TABS tablet Take 1 tablet (10 mg total) by mouth daily.   ezetimibe (ZETIA) 10 MG tablet TAKE 1 TABLET BY MOUTH EVERY DAY   furosemide (LASIX) 40 MG tablet Take  1 tablet (40 mg total) by mouth daily. PATIENT MUST KEEP SCHEDULED ANNUAL OFFICE VISIT FOR FUTURE REFILLS   isosorbide mononitrate (IMDUR) 30 MG 24 hr tablet TAKE 1 TABLET BY MOUTH EVERY DAY   Magnesium Oxide 200 MG TABS Take 1 tablet (200 mg total) by mouth 2 (two) times daily. (Patient taking differently: Take 200 mg by mouth daily.)   Multiple Vitamin (MULTIVITAMIN) tablet Take 1 tablet by mouth daily.   nicotine (NICODERM CQ - DOSED IN MG/24 HOURS) 21 mg/24hr patch Place 1 patch (21 mg total) onto the skin daily.   nicotine polacrilex (COMMIT) 4 MG lozenge Take 1 lozenge (4 mg total) by mouth as needed for smoking cessation. (Patient not taking: Reported on 06/01/2023)   nitroGLYCERIN (NITROSTAT) 0.4 MG SL tablet DISSOLVE 1 TABLET UNDER THE TONGUE EVERY 5 MINUTES AS NEEDED FOR CHEST PAIN. MAX 3 DOSES/15 MIN   OMEGA-3 FATTY ACIDS PO  Take 1,200 mg by mouth daily.   oxyCODONE (OXY IR/ROXICODONE) 5 MG immediate release tablet Take 1 tablet (5 mg total) by mouth every 4 (four) hours as needed for severe pain.   sacubitril-valsartan (ENTRESTO) 24-26 MG TAKE 1 TABLET BY MOUTH TWICE A DAY   spironolactone (ALDACTONE) 25 MG tablet TAKE 1 TABLET BY MOUTH EVERYDAY AT BEDTIME   tamsulosin (FLOMAX) 0.4 MG CAPS capsule Take 0.4 mg by mouth daily.   tiotropium (SPIRIVA) 18 MCG inhalation capsule INHALE 1 CAPSULE VIA HANDIHALER ONCE DAILY AT THE SAME TIME EVERY DAY   [DISCONTINUED] sildenafil (VIAGRA) 50 MG tablet TAKE 1 TABLET BY MOUTH DAILY AS NEEDED FOR ERECTILE DYSFUNCTION. (Patient taking differently: Take 50 mg by mouth daily as needed for erectile dysfunction.)   [DISCONTINUED] traZODone (DESYREL) 50 MG tablet TAKE 0.5-1 TABLETS BY MOUTH AT BEDTIME AS NEEDED FOR SLEEP. (Patient not taking: Reported on 06/01/2023)   No facility-administered medications prior to visit.    ROS        Objective:     BP 98/70   Pulse 73   Temp 97.6 F (36.4 C) (Oral)   Ht 6\' 1"  (1.854 m)   Wt 222 lb (100.7 kg)   SpO2 97%   BMI 29.29 kg/m    Physical Exam General: Alert and oriented no acute distress HEENT: PERRLA, EOMI CV: Regular rhythm no murmurs Pulmonary: Clear bilaterally GI: Soft, nontender Extremities: No pedal edema.  No results found for any visits on 06/01/23.     Assessment & Plan:    Routine Health Maintenance and Physical Exam  Immunization History  Administered Date(s) Administered   Influenza,inj,Quad PF,6+ Mos 09/14/2015, 08/30/2017   Moderna Sars-Covid-2 Vaccination 02/03/2020, 03/02/2020, 11/25/2020   PNEUMOCOCCAL CONJUGATE-20 10/18/2021   Pneumococcal Polysaccharide-23 12/12/2013, 08/30/2017   Zoster Recombinat (Shingrix) 01/24/2022    Health Maintenance  Topic Date Due   Hepatitis C Screening  Never done   DTaP/Tdap/Td (1 - Tdap) Never done   Zoster Vaccines- Shingrix (2 of 2) 03/21/2022    COVID-19 Vaccine (4 - 2023-24 season) 08/04/2022   Medicare Annual Wellness (AWV)  01/04/2023   INFLUENZA VACCINE  07/05/2023   Colonoscopy  03/09/2032   Pneumonia Vaccine 46+ Years old  Completed   HPV VACCINES  Aged Out    Discussed health benefits of physical activity, and encouraged him to engage in regular exercise appropriate for his age and condition.  Problem List Items Addressed This Visit       Other   Erectile dysfunction - Primary    Sent in refill for viagra.  Pt  also taking imdur.  Advised pt on risks of taking these two medications concurrently including hypotension.  Advised pt to take bp at home if he feels dizzy/lightheaded and alert medical provider if hypotensive.  Pt has been on both of these medications in the past going back several years without issue.        Primary insomnia    Resend as 100mg  at bedtime prn.       Relevant Medications   traZODone (DESYREL) 100 MG tablet   Tobacco abuse    Pt expressed desire to quit smoking.  Discussed trying chantix again.  Pt would prefer this over welbutrin or other medication.  Sent in chantix rx.  Advised pt to use nicotine lozenges prn for cravings. Counseled on smoking cessation.       Return in about 4 weeks (around 06/29/2023) for insomnia, smoking.     Sandre Kitty, MD

## 2023-06-03 NOTE — Progress Notes (Unsigned)
Cardiology Office Note    Date:  06/05/2023   ID:  Kerry Mason, DOB 1956/03/12, MRN [id number]  PCP:  Sandre Kitty, MD  Cardiologist:  Dr. [place]   Chief Complaint  Patient presents with   Coronary Artery Disease    History of Present Illness:  Kerry Mason is a 67 y.o. male with CAD s/p CABG and multiple PCI, chronic systolic HF s/p ICD, HLD and tobacco abuse. He had a remote stenting of RCA in 2000 and was lost to follow-up. He ended up having anterior STEMI in January 2015, cardiac catheterization showed occluded proximal RCA with collaterals and the occlusion of the proximal LAD RCA occlusion was felt to be chronic, he was treated with drug-eluting stent to the occluded LAD. EF by cath was 35%. He subsequently required ICD placement. He had Myoview in December 2015 that was high risk with large area of anterior, septal, apical scar and inferior apical ischemia, EF was noted to be 25%. He underwent repeat cardiac cath that showed patent stents in the LAD, but first diagonal had 80% stenosis, chronically occluded RCA with collaterals. Due to his anginal symptom, he underwent a CTO of RCA, procedure was complicated by perforation of mid RCA which resulted in cardiac tamponade and required emergent pericardiocentesis and removal of 1L of blood. Perforation was sealed with Graftmaster and covered stent to the mid RCA. Remainder of his RCA was stented with 4 additional drug-eluting stents. He ended up having pericarditis afterward. He did develop brief PAF during the period of pericarditis.   He was admitted in July 2016 with NSTEMI. Cardiac catheterization showed reocclusion of mid RCA with left-to-right collaterals, he was treated medically with addition of Imdur. He also had a 50% stenosis in the left main, FFR was abnormal, he was referred for CT surgery. He underwent CABG by Dr. Cornelius Moras in 9/16 with LIMA to LAD, SVG to ramus, RCA was not bypassed due to small target and significant scarring  from the prior pericarditis.   He was admitted to the hospital on 08/29/2017 with chest pain. He eventually underwent cardiac catheterization on 08/30/2017 with Dr. Okey Dupre which showed widely patent LIMA to LAD and SVG to ramus, moderate to severely reduced left ventricular contraction, multiple abnormal vessels arising from the LIMA graft, mediastinal mass is a possibility. Otherwise continue medical therapy and a secondary prevention of CAD. CT of chest showed moderate to severe upper lobe central lobar emphysema, 4.1 cm ascending aortic aneurysm, left lower paratracheal lymphadenopathy likely reactive, several scattered 2-3 mm pulmonary nodule.  He presented to [facility] in February 2022 with chest pain. Symptoms somewhat atypical with flat HST. Given his known CAD, heart cath was repeated. PCI attempted of ostial LCx, but was unsuccessful.  He was loaded with plavix in the cath lab, but this was not continued given his need for DOAC.   Was recently seen by [facility] for follow up of AAA. Measured 5.1 cm. Noted left calf claudication and they are planneding to check ABIs on follow up visit.  On 08/18/22 S/p endovascular aortic aneurysm repair with main body right 26 x 14 x 12 extended with 16 x 12 via 16 French sheath and contralateral limb left 20 x 14 via 12 [place] sheath ironed out with M OB balloon. On 04/02/23 was seen device clinic alert for increased PVC burden 21%. Pt reported no symptoms, had been on vacation at the beach, had a trip/fall. When seen in device clinic PVC burden back down to 1%.  He now is farming raising rabbits and quail. Has rare palpitations. He denies any chest pain. Does get SOB occasionally but only lasts a few minutes. He is still smoking 1/2 pk/day. He is getting 7-8K steps a day in. Does note issues with ED.     Past Medical History:  Diagnosis Date   AAA (abdominal aortic aneurysm) (HCC)    Acute systolic CHF (congestive heart failure) (HCC)    AICD (automatic  cardioverter/defibrillator) present    Anxiety    Ascending aortic aneurysm (HCC) 08/31/2017   Back pain 08/31/2017   Basal cell carcinoma    left arm   CAD (coronary artery disease)    a. 2000: s/p stent of RCA 2000 with BMS  b. 2015: STEMI s/p LHC with old occlusion of RCA and DESx2 to LAD  c. 02/03/15: complicated PCI on 3/2 for CTO of mid RCA with coronary perforation and cardiac tamponade requiring emergent pericardiocentesis      Cardiac tamponade    a. 02/03/15 2/2 coronary perforation during CTO procedure. Sealed with graftmaster coated stent.    COPD (chronic obstructive pulmonary disease) (HCC) 08/31/2017   Gout    History of pneumonia    HTN (hypertension)    Hyperlipidemia    Ischemic cardiomyopathy    a. 2D ECHO: EF 25-30%. 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   LIMA to LAD, SVG to ramus intermediate branch, EVH via right thigh    Shortness of breath dyspnea    Tobacco abuse    Urinary frequency     Past Surgical History:  Procedure Laterality Date   ABDOMINAL AORTIC ENDOVASCULAR STENT GRAFT N/A 08/18/2022   Procedure: ABDOMINAL AORTIC ENDOVASCULAR STENT GRAFT;  Surgeon: Maeola Harman, MD;  Location: [facility] OR;  Service: Vascular;  Laterality: N/A;   CARDIAC CATHETERIZATION     CARDIAC CATHETERIZATION N/A 06/14/2015   Procedure: Left Heart Cath and Coronary Angiography;  Surgeon: Runell Gess, MD;  Location: [facility] INVASIVE CV LAB;  Service: Cardiovascular;  Laterality: N/A;   CARDIAC CATHETERIZATION N/A 08/18/2015   Procedure: Intravascular Pressure Wire/FFR Study;  Surgeon: Mickle Campton M Swazil[place], MD;  Location: [facility] INVASIVE CV LAB;  Service: Cardiovascular;  Laterality: N/A;   COLONOSCOPY     COLONOSCOPY WITH PROPOFOL N/A 03/09/2022   Procedure: COLONOSCOPY WITH PROPOFOL;  Surgeon: Benancio Deeds, MD;  Location: [facility] ENDOSCOPY;  Service: Gastroenterology;  Laterality:  N/A;   CORONARY ANGIOPLASTY  2000   CORONARY ANGIOPLASTY WITH STENT PLACEMENT  2015   CORONARY ARTERY BYPASS GRAFT N/A 09/02/2015   Procedure: CORONARY ARTERY BYPASS GRAFTING (CABG) x 2 (LIMA-LAD, SVG-Intermediate) ENDOSCOPIC GREATER SAPHENOUS VEIN HARVEST RIGHT THIGH;  Surgeon: Purcell Nails, MD;  Location: [facility] OR;  Service: Open Heart Surgery;  Laterality: N/A;   CORONARY BALLOON ANGIOPLASTY N/A 01/04/2021   Procedure: CORONARY BALLOON ANGIOPLASTY;  Surgeon: Corky Crafts, MD;  Location: [facility] INVASIVE CV LAB;  Service: Cardiovascular;  Laterality: N/A;   FOOT SURGERY     IMPLANTABLE CARDIOVERTER DEFIBRILLATOR IMPLANT N/A 04/17/2014   Procedure: IMPLANTABLE CARDIOVERTER DEFIBRILLATOR IMPLANT;  Surgeon: Marinus Maw, MD;  Location: [facility] CATH LAB;  Service: Cardiovascular;  Laterality: N/A;   LEFT HEART CATH AND CORONARY ANGIOGRAPHY N/A 01/04/2021   Procedure: LEFT HEART CATH AND CORONARY ANGIOGRAPHY;  Surgeon: Corky Crafts, MD;  Location: [facility] INVASIVE CV LAB;  Service: Cardiovascular;  Laterality:  N/A;   LEFT HEART CATH AND CORS/GRAFTS ANGIOGRAPHY N/A 08/30/2017   Procedure: LEFT HEART CATH AND CORS/GRAFTS ANGIOGRAPHY;  Surgeon: Yvonne Kendall, MD;  Location: [facility] INVASIVE CV LAB;  Service: Cardiovascular;  Laterality: N/A;   LEFT HEART CATHETERIZATION WITH CORONARY ANGIOGRAM N/A 12/11/2013   Procedure: LEFT HEART CATHETERIZATION WITH CORONARY ANGIOGRAM;  Surgeon: Yavonne Kiss M [place], MD;  Location: [facility] CATH LAB;  Service: Cardiovascular;  Laterality: N/A;   LEFT HEART CATHETERIZATION WITH CORONARY ANGIOGRAM N/A 01/05/2015   Procedure: LEFT HEART CATHETERIZATION WITH CORONARY ANGIOGRAM;  Surgeon: Osten Janek M [place], MD;  Location: [facility] CATH LAB;  Service: Cardiovascular;  Laterality: N/A;   PERCUTANEOUS CORONARY STENT INTERVENTION (PCI-S)  12/11/2013   Procedure: PERCUTANEOUS CORONARY STENT INTERVENTION (PCI-S);  Surgeon: Neven Fina M [place], MD;  Location: [facility] CATH LAB;  Service: Cardiovascular;;  prov LAD and mid  LAD   PERCUTANEOUS CORONARY STENT INTERVENTION (PCI-S) N/A 02/03/2015   Procedure: PERCUTANEOUS CORONARY STENT INTERVENTION (PCI-S);  Surgeon: Corky Crafts, MD;  Location: [facility] CATH LAB;  Service: Cardiovascular;  Laterality: N/A;   POLYPECTOMY  03/09/2022   Procedure: POLYPECTOMY;  Surgeon: Benancio Deeds, MD;  Location: [facility] ENDOSCOPY;  Service: Gastroenterology;;   SPERMATOCELECTOMY Right 02/21/2022   Procedure: RIGHT SPERMATOCELECTOMY;  Surgeon: Bjorn Pippin, MD;  Location: [facility] ORS;  Service: Urology;  Laterality: Right;   TEE WITHOUT CARDIOVERSION N/A 09/02/2015   Procedure: TRANSESOPHAGEAL ECHOCARDIOGRAM (TEE);  Surgeon: Purcell Nails, MD;  Location: [facility] OR;  Service: Open Heart Surgery;  Laterality: N/A;   ULTRASOUND GUIDANCE FOR VASCULAR ACCESS Bilateral 08/18/2022   Procedure: ULTRASOUND GUIDANCE FOR VASCULAR ACCESS, BILATERAL FEMORAL ARTERIES;  Surgeon: Maeola Harman, MD;  Location: [facility] OR;  Service: Vascular;  Laterality: Bilateral;    Current Medications: Outpatient Medications Prior to Visit  Medication Sig Dispense Refill   allopurinol (ZYLOPRIM) 300 MG tablet TAKE 1 TABLET BY MOUTH TWICE A DAY 180 tablet 1   apixaban (ELIQUIS) 5 MG TABS tablet TAKE 1 TABLET BY MOUTH TWICE A DAY 60 tablet 5   Ascorbic Acid (VITAMIN C) 1000 MG tablet Take 1,000 mg by mouth daily.     aspirin EC 81 MG tablet Take 81 mg by mouth daily.     atorvastatin (LIPITOR) 80 MG tablet TAKE 1 TABLET BY MOUTH EVERY DAY 90 tablet 3   budesonide-formoterol (SYMBICORT) 160-4.5 MCG/ACT inhaler Inhale 2 puffs into the lungs in the morning and at bedtime. 10.2 each 3   Calcium Carbonate-Vit D-Min (CALCIUM 1200 PO) Take 1 capsule by mouth daily.     carvedilol (COREG) 6.25 MG tablet TAKE 1 TABLET BY MOUTH TWICE A DAY 180 tablet 3   cholecalciferol (VITAMIN D3) 25 MCG (1000 UNIT) tablet Take 1,000 Units by mouth daily.     colchicine 0.6 MG tablet Take 1-2 tablets (0.6-1.2 mg total) by mouth See admin  instructions. TAKE 2 TABLETS ONE TIME. THEN TAKE 1 TABLET AFTER ONE HOUR. THEN TAKE 1 TABLET DAILY FOR 5 DAYS.     empagliflozin (JARDIANCE) 10 MG TABS tablet Take 1 tablet (10 mg total) by mouth daily. 90 tablet 1   ezetimibe (ZETIA) 10 MG tablet TAKE 1 TABLET BY MOUTH EVERY DAY 90 tablet 3   Multiple Vitamin (MULTIVITAMIN) tablet Take 1 tablet by mouth daily.     nitroGLYCERIN (NITROSTAT) 0.4 MG SL tablet DISSOLVE 1 TABLET UNDER THE TONGUE EVERY 5 MINUTES AS NEEDED FOR CHEST PAIN. MAX 3 DOSES/15 MIN 75 tablet 3   OMEGA-3 FATTY ACIDS PO Take 1,200 mg by  mouth daily.     sacubitril-valsartan (ENTRESTO) 24-26 MG TAKE 1 TABLET BY MOUTH TWICE A DAY 180 tablet 3   spironolactone (ALDACTONE) 25 MG tablet TAKE 1 TABLET BY MOUTH EVERYDAY AT BEDTIME 90 tablet 3   tamsulosin (FLOMAX) 0.4 MG CAPS capsule Take 0.4 mg by mouth daily.     tiotropium (SPIRIVA) 18 MCG inhalation capsule INHALE 1 CAPSULE VIA HANDIHALER ONCE DAILY AT THE SAME TIME EVERY DAY 30 capsule 2   traZODone (DESYREL) 100 MG tablet Take 1 tablet (100 mg total) by mouth at bedtime as needed for sleep. 30 tablet 0   Varenicline Tartrate, Starter, (CHANTIX STARTING MONTH PAK) 0.5 MG X 11 & 1 MG X 42 TBPK Take one 0.5 mg tablet by mouth once daily for 3 days, then increase to one 0.5 mg tablet twice daily for 4 days, then increase to one 1 mg tablet twice daily. 53 each 0   furosemide (LASIX) 40 MG tablet Take 1 tablet (40 mg total) by mouth daily. PATIENT MUST KEEP SCHEDULED ANNUAL OFFICE VISIT FOR FUTURE REFILLS 90 tablet 0   isosorbide mononitrate (IMDUR) 30 MG 24 hr tablet TAKE 1 TABLET BY MOUTH EVERY DAY 90 tablet 3   sildenafil (VIAGRA) 50 MG tablet Take 1 tablet (50 mg total) by mouth daily as needed for erectile dysfunction. 10 tablet 3   Magnesium Oxide 200 MG TABS Take 1 tablet (200 mg total) by mouth 2 (two) times daily. (Patient not taking: Reported on 06/05/2023) 60 tablet 0   nicotine (NICODERM CQ - DOSED IN MG/24 HOURS) 21 mg/24hr  patch Place 1 patch (21 mg total) onto the skin daily. (Patient not taking: Reported on 06/05/2023) 28 patch 2   oxyCODONE (OXY IR/ROXICODONE) 5 MG immediate release tablet Take 1 tablet (5 mg total) by mouth every 4 (four) hours as needed for severe pain. (Patient not taking: Reported on 06/05/2023) 30 tablet 0   No facility-administered medications prior to visit.     Allergies:   Patient has no known allergies.   Social History   Socioeconomic History   Marital status: Divorced    Spouse name: Not on file   Number of children: 2   Years of education: Not on file   Highest education level: Not on file  Occupational History   Occupation: [employer]  Tobacco Use   Smoking status: Every Day    Packs/day: 0.50    Years: 35.00    Additional pack years: 0.00    Total pack years: 17.50    Types: Cigarettes    Passive exposure: Never   Smokeless tobacco: Never  Vaping Use   Vaping Use: Never used  Substance and Sexual Activity   Alcohol use: No    Alcohol/week: 0.0 standard drinks of alcohol   Drug use: No   Sexual activity: Not Currently  Other Topics Concern   Not on file  Social History Narrative   Not on file   Social Determinants of Health   Financial Resource Strain: Not on file  Food Insecurity: Not on file  Transportation Needs: Not on file  Physical Activity: Not on file  Stress: Not on file  Social Connections: Not on file     Family History:  The patient's family history includes CAD in his father; Cancer in his mother.   ROS:   Please see the history of present illness.    ROS All other systems reviewed and are negative.   PHYSICAL EXAM:   VS:  BP 110/72 (BP Location: Left Arm, Patient Position: Sitting, Cuff Size: Normal)   Pulse 60   Ht 6\' 1"  (1.854 m)   Wt 223 lb (101.2 kg)   SpO2 95%   BMI 29.42 kg/m    GENERAL:  Well appearing WM in NAD HEENT:  PERRL, EOMI, sclera are clear. Oropharynx is clear. NECK:  No jugular venous distention,  carotid upstroke brisk and symmetric, no bruits, no thyromegaly or adenopathy LUNGS:  Clear to auscultation bilaterally CHEST:  Unremarkable HEART:  RRR,  PMI not displaced or sustained,S1 and S2 within normal limits, no S3, no S4: no clicks, no rubs, no murmurs ABD:  Soft, nontender. BS +, no masses or bruits. No hepatomegaly, no splenomegaly EXT:  2 + pulses throughout, no edema, no cyanosis no clubbing SKIN:  Warm and dry.  No rashes NEURO:  Alert and oriented x 3. Cranial nerves II through XII intact. PSYCH:  Cognitively intact    Wt Readings from Last 3 Encounters:  06/05/23 223 lb (101.2 kg)  06/01/23 222 lb (100.7 kg)  05/11/23 216 lb 12.8 oz (98.3 kg)      Studies/Labs Reviewed:   EKG:  EKG is not ordered today.     Recent Labs: 05/11/2023: BUN 18; Creatinine, Ser 0.98; Magnesium 2.2; Potassium 4.5; Sodium 139 05/29/2023: ALT 15; Hemoglobin 15.4; Platelets 295   Lipid Panel    Component Value Date/Time   CHOL 99 (L) 05/29/2023 0927   TRIG 100 05/29/2023 0927   HDL 34 (L) 05/29/2023 0927   CHOLHDL 2.9 05/29/2023 0927   CHOLHDL 4.3 01/04/2021 0236   VLDL 34 01/04/2021 0236   LDLCALC 46 05/29/2023 0927    Additional studies/ records that were reviewed today include:   Pulmonary tests PFT 10/03/2017>> FEV1 2.57 (65%) TLC, 6.93 (91%), DLCO 60% PFT 08/31/15 >> FEV1 3.04 (75%), FEV1% 67, TLC 7.98 (104%), DLCO 61% CT chest 08/31/17 >> severe CAD, TAA 4.1 cm, mod/severe centrilobular emphysema, multiple nodules 2-3 mm  Cath 08/30/2017 FINDINGS: Stable native coronary artery disease with 50% distal LMCA and chronic total occlusion of ostial RCA. Distal RCA fills via left-to-right collaterals. Widely patent LIMA to LAD and SVG to ramus. Multiple abnormal vessels arising from the LIMA graft. Mediastinal mass is a consideration. Normal left ventricular filling pressure. Moderate to severely reduced left ventricular contraction.   RECOMMENDATIONS: Recommend CT chest to  evaluate for intrathoracic mass. Continue medical therapy and secondary prevention of CAD.  Cath: 01/04/21   LM lesion is 50% stenosed. Patent LIMA to LAD. Patent SVG to ramus. 2nd Diag lesion is 70% stenosed. Mid RCA to Dist RCA lesion is 100% stenosed. Prox RCA to Mid RCA lesion is 100% stenosed. Ost RCA to Prox RCA lesion is 95% stenosed. Ost Cx lesion is 80% stenosed. This territory is not bypassed, but it is a relatively small vessel. Unable to advance balloon after successfully guiding the wire though the ramus graft and retrograde to the ostial circumflex and across the lesion. The left ventricular ejection fraction is 25-35% by visual estimate. There is moderate to severe left ventricular systolic dysfunction. LV end diastolic pressure is normal. There is no aortic valve stenosis.   Medical therapy for CAD.  Unsuccessful attempt at PCI of ostial circumflex.  Restart IV heparin 8 hours post sheath pull.  OK to start DOAC tomorrow for atrial fibrillation.  He was loaded with Plavix but will not continue if he is starting a DOAC.  If he is not starting a DOAC, DAPT would  be reasonable for his CAD.     Diagnostic Dominance: Right     Echo: 01/04/21   IMPRESSIONS     1. There is no left ventricular thrombus (Definity contrast was used).  There is apical dyskinesis. There is severe inferior wall hypokinesis and  inferoseptal akinesis.. Left ventricular ejection fraction, by estimation,  is 25 to 30%. The left ventricle  has severely decreased function. The left ventricle demonstrates regional  wall motion abnormalities (see scoring diagram/findings for description).  The left ventricular internal cavity size was moderately to severely  dilated. Left ventricular diastolic  parameters are consistent with Grade I diastolic dysfunction (impaired  relaxation).   2. Right ventricular systolic function is normal. The right ventricular  size is normal. There is normal pulmonary artery  systolic pressure. The  estimated right ventricular systolic pressure is 23.2 mmHg.   3. Left atrial size was severely dilated.   4. The mitral valve is normal in structure. Trivial mitral valve  regurgitation.   5. The aortic valve is tricuspid. There is mild calcification of the  aortic valve. There is mild thickening of the aortic valve. Aortic valve  regurgitation is not visualized. Mild aortic valve sclerosis is present,  with no evidence of aortic valve  stenosis.   6. Aortic dilatation noted. There is mild dilatation of the aortic root,  measuring 39 mm.   7. The inferior vena cava is normal in size with greater than 50%  respiratory variability, suggesting right atrial pressure of 3 mmHg.    ASSESSMENT:    No diagnosis found.    PLAN:   1. CAD s/p CABG x 2, multiple PCI. Patent LIMA to the LAD, SVG to ramus CTO of RCA complicated by perforation, tamponade, and pericarditis Unsuccessful PCI of Cx - 01/04/21- I personally reviewed films. The angulation into the LCx is not favorable for PCI and it looks like the LCx is adequately supplied by the SVG to ramus - he has no further chest pain - continue ASA - will DC imdur since no angina and desire to take ED medication.      2. Paroxysmal atrial fibrillation - started on eliquis 5 mg BID-  This patients CHA2DS2-VASc Score and unadjusted Ischemic Stroke Rate (% per year) is equal to 3.2 % stroke rate/year from a score of 3 (CHF, CAD, HTN)     3.  Chronic systolic and diastolic heart failure - echo with EF 30-35% - on Entresto at maximally tolerated dose due to BP - will continue  lasix, on Coreg - continue Jardiance     4. Hypertension - continue meds     6. Hyperlipidemia with LDL goal < 70 - last LDL at goal - continue statin - zetia was added at discharge    7. Smoker - he is interested in cessation - wants to try Chantix again  8. AAA. S/p endovascular stent graft.   9. ED. Ok to use Viagra. Will  discontinue Imdur    Medication Adjustments/Labs and Tests Ordered: Current medicines are reviewed at length with the patient today.  Concerns regarding medicines are outlined above.  Medication changes, Labs and Tests ordered today are listed in the Patient Instructions below. There are no Patient Instructions on file for this visit.     Signed, Kerry Yin [place], MD  06/05/2023 5:35 PM    [facility] [address] [place], [place], [place]  [address] Phone: [phone]; Fax: [phone]

## 2023-06-04 NOTE — Progress Notes (Signed)
Overall labs good. Review at visit 06/29/2023

## 2023-06-05 ENCOUNTER — Encounter: Payer: Self-pay | Admitting: Cardiology

## 2023-06-05 ENCOUNTER — Ambulatory Visit: Payer: 59 | Attending: Cardiology | Admitting: Cardiology

## 2023-06-05 VITALS — BP 110/72 | HR 60 | Ht 73.0 in | Wt 223.0 lb

## 2023-06-05 DIAGNOSIS — I480 Paroxysmal atrial fibrillation: Secondary | ICD-10-CM | POA: Diagnosis not present

## 2023-06-05 DIAGNOSIS — I5022 Chronic systolic (congestive) heart failure: Secondary | ICD-10-CM

## 2023-06-05 DIAGNOSIS — Z720 Tobacco use: Secondary | ICD-10-CM | POA: Diagnosis not present

## 2023-06-05 DIAGNOSIS — I2510 Atherosclerotic heart disease of native coronary artery without angina pectoris: Secondary | ICD-10-CM

## 2023-06-05 DIAGNOSIS — Z951 Presence of aortocoronary bypass graft: Secondary | ICD-10-CM

## 2023-06-05 MED ORDER — SILDENAFIL CITRATE 20 MG PO TABS: ORAL_TABLET | ORAL | 1 refills | Status: AC

## 2023-06-05 MED ORDER — FUROSEMIDE 40 MG PO TABS: 40.0000 mg | ORAL_TABLET | Freq: Every day | ORAL | 3 refills | Status: DC

## 2023-06-05 NOTE — Patient Instructions (Signed)
Medication Instructions:  Stop Isosorbide Ok to take Sildenafil 20 mg 3 to 5 tablets daily as needed Continue all other medications *If you need a refill on your cardiac medications before your next appointment, please call your pharmacy*   Lab Work: None ordered   Testing/Procedures: None ordered   Follow-Up: At [facility], you and your health needs are our priority.  As part of our continuing mission to provide you with exceptional heart care, we have created designated Provider Care Teams.  These Care Teams include your primary Cardiologist (physician) and Advanced Practice Providers (APPs -  Physician Assistants and Nurse Practitioners) who all work together to provide you with the care you need, when you need it.  We recommend signing up for the patient portal called "MyChart".  Sign up information is provided on this After Visit Summary.  MyChart is used to connect with patients for Virtual Visits (Telemedicine).  Patients are able to view lab/test results, encounter notes, upcoming appointments, etc.  Non-urgent messages can be sent to your provider as well.   To learn more about what you can do with MyChart, go to [email or web address].    Your next appointment:  1 year    Call in March to schedule July appointment     Provider:  Dr.Jordan

## 2023-06-11 NOTE — Telephone Encounter (Signed)
Pt returned call and I gave him the information below and he will contact insurance to see what is covered and will call back.

## 2023-06-24 ENCOUNTER — Other Ambulatory Visit: Payer: Self-pay | Admitting: Family Medicine

## 2023-06-24 DIAGNOSIS — F5101 Primary insomnia: Secondary | ICD-10-CM

## 2023-06-29 ENCOUNTER — Ambulatory Visit: Payer: 59 | Admitting: Family Medicine

## 2023-07-03 DIAGNOSIS — D2239 Melanocytic nevi of other parts of face: Secondary | ICD-10-CM | POA: Diagnosis not present

## 2023-07-03 DIAGNOSIS — L814 Other melanin hyperpigmentation: Secondary | ICD-10-CM | POA: Diagnosis not present

## 2023-07-03 DIAGNOSIS — Z08 Encounter for follow-up examination after completed treatment for malignant neoplasm: Secondary | ICD-10-CM | POA: Diagnosis not present

## 2023-07-03 DIAGNOSIS — L820 Inflamed seborrheic keratosis: Secondary | ICD-10-CM | POA: Diagnosis not present

## 2023-07-03 DIAGNOSIS — Z85820 Personal history of malignant melanoma of skin: Secondary | ICD-10-CM | POA: Diagnosis not present

## 2023-07-03 DIAGNOSIS — D225 Melanocytic nevi of trunk: Secondary | ICD-10-CM | POA: Diagnosis not present

## 2023-07-03 DIAGNOSIS — L738 Other specified follicular disorders: Secondary | ICD-10-CM | POA: Diagnosis not present

## 2023-07-03 DIAGNOSIS — L821 Other seborrheic keratosis: Secondary | ICD-10-CM | POA: Diagnosis not present

## 2023-07-03 DIAGNOSIS — L538 Other specified erythematous conditions: Secondary | ICD-10-CM | POA: Diagnosis not present

## 2023-07-12 ENCOUNTER — Encounter: Payer: No Typology Code available for payment source | Admitting: Internal Medicine

## 2023-07-12 ENCOUNTER — Ambulatory Visit (INDEPENDENT_AMBULATORY_CARE_PROVIDER_SITE_OTHER): Payer: 59

## 2023-07-12 ENCOUNTER — Other Ambulatory Visit: Payer: Self-pay | Admitting: Cardiology

## 2023-07-12 DIAGNOSIS — I255 Ischemic cardiomyopathy: Secondary | ICD-10-CM | POA: Diagnosis not present

## 2023-07-12 DIAGNOSIS — I5022 Chronic systolic (congestive) heart failure: Secondary | ICD-10-CM

## 2023-07-13 NOTE — Telephone Encounter (Signed)
Prescription refill request for Eliquis received. Indication:afib Last office visit:7/24 Scr:0.98  6/24 Age: 67 Weight:101.2  kg  Prescription refilled

## 2023-07-18 ENCOUNTER — Ambulatory Visit (HOSPITAL_COMMUNITY)
Admission: RE | Admit: 2023-07-18 | Discharge: 2023-07-18 | Disposition: A | Discharge status: Home / Self Care | Payer: 59 | Source: Ambulatory Visit | Attending: Vascular Surgery | Admitting: Vascular Surgery

## 2023-07-18 ENCOUNTER — Ambulatory Visit (INDEPENDENT_AMBULATORY_CARE_PROVIDER_SITE_OTHER): Payer: 59 | Admitting: Vascular Surgery

## 2023-07-18 ENCOUNTER — Encounter: Payer: Self-pay | Admitting: Vascular Surgery

## 2023-07-18 VITALS — BP 114/71 | HR 64 | Temp 97.7°F | Resp 18 | Ht 73.0 in | Wt 223.0 lb

## 2023-07-18 DIAGNOSIS — I7143 Infrarenal abdominal aortic aneurysm, without rupture: Secondary | ICD-10-CM | POA: Insufficient documentation

## 2023-07-18 DIAGNOSIS — I714 Abdominal aortic aneurysm, without rupture, unspecified: Secondary | ICD-10-CM | POA: Diagnosis not present

## 2023-07-18 NOTE — Progress Notes (Signed)
Patient ID: Kerry Mason, male   DOB: [birth year], 67 y.o.   MRN: [id number]  Reason for Consult: No chief complaint on file.   Referred by Carlean Jews, NP  Subjective:     HPI:  Kerry Mason is a 67 y.o. male underwent endovascular aneurysm repair for 5.5 cm abdominal aortic aneurysm.  This was approximately 1 year ago.  It was complicated by persistent nausea and weight loss but this subsequently resolved.  He was also having initial back pain but with continued weight loss he is no longer having the back pain.  He presents today without any new complaints.  Past Medical History:  Diagnosis Date   AAA (abdominal aortic aneurysm) (HCC)    Acute systolic CHF (congestive heart failure) (HCC)    AICD (automatic cardioverter/defibrillator) present    Anxiety    Ascending aortic aneurysm (HCC) 08/31/2017   Back pain 08/31/2017   Basal cell carcinoma    left arm   CAD (coronary artery disease)    a. 2000: s/p stent of RCA 2000 with BMS  b. 2015: STEMI s/p LHC with old occlusion of RCA and DESx2 to LAD  c. 02/03/15: complicated PCI on 3/2 for CTO of mid RCA with coronary perforation and cardiac tamponade requiring emergent pericardiocentesis      Cardiac tamponade    a. 02/03/15 2/2 coronary perforation during CTO procedure. Sealed with graftmaster coated stent.    COPD (chronic obstructive pulmonary disease) (HCC) 08/31/2017   Gout    History of pneumonia    HTN (hypertension)    Hyperlipidemia    Ischemic cardiomyopathy    a. 2D ECHO: EF 25-30%. 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   LIMA to LAD, SVG to ramus intermediate branch, EVH via right thigh    Shortness of breath dyspnea    Tobacco abuse    Urinary frequency    Family History  Problem Relation Age of Onset   CAD Father        PTCA   Cancer Mother        LYMPHOMA   Past Surgical History:  Procedure  Laterality Date   ABDOMINAL AORTIC ENDOVASCULAR STENT GRAFT N/A 08/18/2022   Procedure: ABDOMINAL AORTIC ENDOVASCULAR STENT GRAFT;  Surgeon: Maeola Harman, MD;  Location: [facility] OR;  Service: Vascular;  Laterality: N/A;   CARDIAC CATHETERIZATION     CARDIAC CATHETERIZATION N/A 06/14/2015   Procedure: Left Heart Cath and Coronary Angiography;  Surgeon: Runell Gess, MD;  Location: [facility] INVASIVE CV LAB;  Service: Cardiovascular;  Laterality: N/A;   CARDIAC CATHETERIZATION N/A 08/18/2015   Procedure: Intravascular Pressure Wire/FFR Study;  Surgeon: Peter M Swaziland, MD;  Location: [facility] INVASIVE CV LAB;  Service: Cardiovascular;  Laterality: N/A;   COLONOSCOPY     COLONOSCOPY WITH PROPOFOL N/A 03/09/2022   Procedure: COLONOSCOPY WITH PROPOFOL;  Surgeon: Benancio Deeds, MD;  Location: [facility] ENDOSCOPY;  Service: Gastroenterology;  Laterality: N/A;   CORONARY ANGIOPLASTY  2000   CORONARY ANGIOPLASTY WITH STENT PLACEMENT  2015   CORONARY ARTERY BYPASS GRAFT N/A 09/02/2015   Procedure: CORONARY ARTERY BYPASS GRAFTING (CABG) x 2 (LIMA-LAD, SVG-Intermediate) ENDOSCOPIC GREATER SAPHENOUS VEIN HARVEST RIGHT THIGH;  Surgeon: Purcell Nails, MD;  Location: [facility] OR;  Service: Open Heart Surgery;  Laterality: N/A;   CORONARY BALLOON ANGIOPLASTY N/A 01/04/2021   Procedure: CORONARY BALLOON ANGIOPLASTY;  Surgeon: Corky Crafts, MD;  Location: [facility] INVASIVE CV LAB;  Service: Cardiovascular;  Laterality: N/A;   FOOT SURGERY     IMPLANTABLE CARDIOVERTER DEFIBRILLATOR IMPLANT N/A 04/17/2014   Procedure: IMPLANTABLE CARDIOVERTER DEFIBRILLATOR IMPLANT;  Surgeon: Marinus Maw, MD;  Location: [facility] CATH LAB;  Service: Cardiovascular;  Laterality: N/A;   LEFT HEART CATH AND CORONARY ANGIOGRAPHY N/A 01/04/2021   Procedure: LEFT HEART CATH AND CORONARY ANGIOGRAPHY;  Surgeon: Corky Crafts, MD;  Location: [facility] INVASIVE CV LAB;  Service: Cardiovascular;  Laterality: N/A;   LEFT HEART CATH AND CORS/GRAFTS ANGIOGRAPHY N/A  08/30/2017   Procedure: LEFT HEART CATH AND CORS/GRAFTS ANGIOGRAPHY;  Surgeon: Yvonne Kendall, MD;  Location: [facility] INVASIVE CV LAB;  Service: Cardiovascular;  Laterality: N/A;   LEFT HEART CATHETERIZATION WITH CORONARY ANGIOGRAM N/A 12/11/2013   Procedure: LEFT HEART CATHETERIZATION WITH CORONARY ANGIOGRAM;  Surgeon: Peter M Swaziland, MD;  Location: [facility] CATH LAB;  Service: Cardiovascular;  Laterality: N/A;   LEFT HEART CATHETERIZATION WITH CORONARY ANGIOGRAM N/A 01/05/2015   Procedure: LEFT HEART CATHETERIZATION WITH CORONARY ANGIOGRAM;  Surgeon: Peter M Swaziland, MD;  Location: [facility] CATH LAB;  Service: Cardiovascular;  Laterality: N/A;   PERCUTANEOUS CORONARY STENT INTERVENTION (PCI-S)  12/11/2013   Procedure: PERCUTANEOUS CORONARY STENT INTERVENTION (PCI-S);  Surgeon: Peter M Swaziland, MD;  Location: [facility] CATH LAB;  Service: Cardiovascular;;  prov LAD and mid LAD   PERCUTANEOUS CORONARY STENT INTERVENTION (PCI-S) N/A 02/03/2015   Procedure: PERCUTANEOUS CORONARY STENT INTERVENTION (PCI-S);  Surgeon: Corky Crafts, MD;  Location: [facility] CATH LAB;  Service: Cardiovascular;  Laterality: N/A;   POLYPECTOMY  03/09/2022   Procedure: POLYPECTOMY;  Surgeon: Benancio Deeds, MD;  Location: [facility] ENDOSCOPY;  Service: Gastroenterology;;   SPERMATOCELECTOMY Right 02/21/2022   Procedure: RIGHT SPERMATOCELECTOMY;  Surgeon: Bjorn Pippin, MD;  Location: [facility] ORS;  Service: Urology;  Laterality: Right;   TEE WITHOUT CARDIOVERSION N/A 09/02/2015   Procedure: TRANSESOPHAGEAL ECHOCARDIOGRAM (TEE);  Surgeon: Purcell Nails, MD;  Location: [facility] OR;  Service: Open Heart Surgery;  Laterality: N/A;   ULTRASOUND GUIDANCE FOR VASCULAR ACCESS Bilateral 08/18/2022   Procedure: ULTRASOUND GUIDANCE FOR VASCULAR ACCESS, BILATERAL FEMORAL ARTERIES;  Surgeon: Maeola Harman, MD;  Location: [facility] OR;  Service: Vascular;  Laterality: Bilateral;    Short Social History:  Social History   Tobacco Use   Smoking status: Every Day    Current  packs/day: 0.50    Average packs/day: 0.5 packs/day for 35.0 years (17.5 ttl pk-yrs)    Types: Cigarettes    Passive exposure: Never   Smokeless tobacco: Never  Substance Use Topics   Alcohol use: No    Alcohol/week: 0.0 standard drinks of alcohol    No Known Allergies  Current Outpatient Medications  Medication Sig Dispense Refill   allopurinol (ZYLOPRIM) 300 MG tablet TAKE 1 TABLET BY MOUTH TWICE A DAY 180 tablet 1   Ascorbic Acid (VITAMIN C) 1000 MG tablet Take 1,000 mg by mouth daily.     aspirin EC 81 MG tablet Take 81 mg by mouth daily.     atorvastatin (LIPITOR) 80 MG tablet TAKE 1 TABLET BY MOUTH EVERY DAY 90 tablet 3   budesonide-formoterol (SYMBICORT) 160-4.5 MCG/ACT inhaler Inhale 2 puffs into the lungs in the morning and at bedtime. 10.2 each 3   Calcium Carbonate-Vit D-Min (CALCIUM 1200 PO) Take 1 capsule by mouth daily.     carvedilol (COREG) 6.25 MG tablet TAKE 1 TABLET BY MOUTH TWICE A DAY 180 tablet 3   cholecalciferol (  VITAMIN D3) 25 MCG (1000 UNIT) tablet Take 1,000 Units by mouth daily.     colchicine 0.6 MG tablet Take 1-2 tablets (0.6-1.2 mg total) by mouth See admin instructions. TAKE 2 TABLETS ONE TIME. THEN TAKE 1 TABLET AFTER ONE HOUR. THEN TAKE 1 TABLET DAILY FOR 5 DAYS.     ELIQUIS 5 MG TABS tablet TAKE 1 TABLET BY MOUTH TWICE A DAY 60 tablet 5   empagliflozin (JARDIANCE) 10 MG TABS tablet Take 1 tablet (10 mg total) by mouth daily. 90 tablet 1   ezetimibe (ZETIA) 10 MG tablet TAKE 1 TABLET BY MOUTH EVERY DAY 90 tablet 3   furosemide (LASIX) 40 MG tablet Take 1 tablet (40 mg total) by mouth daily. 90 tablet 3   Multiple Vitamin (MULTIVITAMIN) tablet Take 1 tablet by mouth daily.     nitroGLYCERIN (NITROSTAT) 0.4 MG SL tablet DISSOLVE 1 TABLET UNDER THE TONGUE EVERY 5 MINUTES AS NEEDED FOR CHEST PAIN. MAX 3 DOSES/15 MIN 75 tablet 3   OMEGA-3 FATTY ACIDS PO Take 1,200 mg by mouth daily.     sacubitril-valsartan (ENTRESTO) 24-26 MG TAKE 1 TABLET BY MOUTH TWICE A  DAY 180 tablet 3   sildenafil (REVATIO) 20 MG tablet Take 3 to 5 tablets daily as needed 100 tablet 1   spironolactone (ALDACTONE) 25 MG tablet TAKE 1 TABLET BY MOUTH EVERYDAY AT BEDTIME 90 tablet 3   tamsulosin (FLOMAX) 0.4 MG CAPS capsule Take 0.4 mg by mouth daily.     tiotropium (SPIRIVA) 18 MCG inhalation capsule INHALE 1 CAPSULE VIA HANDIHALER ONCE DAILY AT THE SAME TIME EVERY DAY 30 capsule 2   traZODone (DESYREL) 100 MG tablet Take 1 tablet (100 mg total) by mouth at bedtime as needed for sleep. 30 tablet 0   Varenicline Tartrate, Starter, (CHANTIX STARTING MONTH PAK) 0.5 MG X 11 & 1 MG X 42 TBPK Take one 0.5 mg tablet by mouth once daily for 3 days, then increase to one 0.5 mg tablet twice daily for 4 days, then increase to one 1 mg tablet twice daily. 53 each 0   No current facility-administered medications for this visit.    Review of Systems  Constitutional:  Constitutional negative. HENT: HENT negative.  Eyes: Eyes negative.  Respiratory: Respiratory negative.  Cardiovascular: Cardiovascular negative.  GI: Gastrointestinal negative.  Musculoskeletal: Positive for back pain.  Neurological: Neurological negative. Hematologic: Hematologic/lymphatic negative.  Psychiatric: Psychiatric negative.        Objective:  Objective  Vitals:   07/18/23 1019  BP: 114/71  Pulse: 64  Resp: 18  Temp: 97.7 F (36.5 C)  SpO2: 94%     Physical Exam HENT:     Head: Normocephalic.     Nose: Nose normal.  Eyes:     Pupils: Pupils are equal, round, and reactive to light.  Cardiovascular:     Rate and Rhythm: Normal rate.     Pulses:          Popliteal pulses are 2+ on the right side and 2+ on the left side.  Pulmonary:     Effort: Pulmonary effort is normal.  Abdominal:     General: Abdomen is flat.  Musculoskeletal:        General: Normal range of motion.     Right lower leg: No edema.     Left lower leg: No edema.  Skin:    General: Skin is warm.     Capillary Refill:  Capillary refill takes less than 2 seconds.  Neurological:  General: No focal deficit present.     Mental Status: He is alert.  Psychiatric:        Mood and Affect: Mood normal.        Thought Content: Thought content normal.        Judgment: Judgment normal.     Data: Endovascular Aortic Repair (EVAR):  +----------+----------------+-------------------+-------------------+           Diameter AP (cm)Diameter Trans (cm)Velocities (cm/sec)  +----------+----------------+-------------------+-------------------+  Aorta    4.98            5.34               59                   +----------+----------------+-------------------+-------------------+  Right Limb1.52            1.60               77                   +----------+----------------+-------------------+-------------------+  Left Limb 1.57            1.65               41                   +----------+----------------+-------------------+-------------------+        Summary:  Abdominal Aorta: Patent endovascular aneurysm repair with no evidence of  endoleak on todays exam, The largest aortic diameter has decreased  compared to prior exam. Previous diameter measurement was 5.5 cm obtained  on 08/18/2022.  this may be due o overlying bowel gas.      Assessment/Plan:    67 year old male with decreased aneurysm size status post EVAR 1 year ago without evidence of endoleak today greatest diameter 5.3 cm.  He does have somewhat enlarged popliteal arteries without previous duplex we will have him follow-up in 1 year with EVAR duplex and bilateral lower extremity popliteal artery evaluation as well.  If he has issues in the interim we will certainly see him sooner.     Maeola Harman MD [facility]

## 2023-07-20 ENCOUNTER — Other Ambulatory Visit: Payer: Self-pay

## 2023-07-20 DIAGNOSIS — I70212 Atherosclerosis of native arteries of extremities with intermittent claudication, left leg: Secondary | ICD-10-CM

## 2023-07-20 DIAGNOSIS — I7143 Infrarenal abdominal aortic aneurysm, without rupture: Secondary | ICD-10-CM

## 2023-08-28 NOTE — Progress Notes (Signed)
Remote ICD transmission.   

## 2023-09-09 ENCOUNTER — Other Ambulatory Visit: Payer: Self-pay | Admitting: Cardiology

## 2023-09-09 DIAGNOSIS — Z95810 Presence of automatic (implantable) cardiac defibrillator: Secondary | ICD-10-CM

## 2023-09-09 DIAGNOSIS — I255 Ischemic cardiomyopathy: Secondary | ICD-10-CM

## 2023-09-13 ENCOUNTER — Telehealth: Payer: Self-pay

## 2023-09-13 NOTE — Telephone Encounter (Signed)
No messages received for at least 21 days Last message received 22 days ago. The patient will be deactivated in 68 days. 

## 2023-09-14 NOTE — Telephone Encounter (Signed)
[employer] is ordering the patient a new power cord. He should receive it in 7-10 business days.

## 2023-09-17 ENCOUNTER — Other Ambulatory Visit: Payer: Self-pay | Admitting: Nurse Practitioner

## 2023-09-17 DIAGNOSIS — M109 Gout, unspecified: Secondary | ICD-10-CM

## 2023-09-18 MED ORDER — ALLOPURINOL 300 MG PO TABS
300.0000 mg | ORAL_TABLET | Freq: Two times a day (BID) | ORAL | 1 refills | Status: DC
Start: 2023-09-18 — End: 2024-03-25

## 2023-09-21 NOTE — Telephone Encounter (Signed)
Pt monitor is now up to date.

## 2023-09-27 ENCOUNTER — Other Ambulatory Visit: Payer: Self-pay | Admitting: Cardiology

## 2023-09-27 DIAGNOSIS — I5022 Chronic systolic (congestive) heart failure: Secondary | ICD-10-CM

## 2023-10-11 ENCOUNTER — Ambulatory Visit (INDEPENDENT_AMBULATORY_CARE_PROVIDER_SITE_OTHER): Payer: 59

## 2023-10-11 DIAGNOSIS — I255 Ischemic cardiomyopathy: Secondary | ICD-10-CM

## 2023-10-12 LAB — CUP PACEART REMOTE DEVICE CHECK
Battery Voltage: 15
Date Time Interrogation Session: 20241107094930
Implantable Lead Connection Status: 753985
Implantable Lead Implant Date: 20150515
Implantable Lead Location: 753860
Implantable Lead Model: 365
Implantable Lead Serial Number: 10579264
Implantable Pulse Generator Implant Date: 20150515
Pulse Gen Model: 383594
Pulse Gen Serial Number: 60800912

## 2023-10-16 ENCOUNTER — Other Ambulatory Visit: Payer: Self-pay | Admitting: Nurse Practitioner

## 2023-10-16 DIAGNOSIS — J449 Chronic obstructive pulmonary disease, unspecified: Secondary | ICD-10-CM

## 2023-10-20 ENCOUNTER — Other Ambulatory Visit: Payer: Self-pay | Admitting: Family Medicine

## 2023-10-29 NOTE — Progress Notes (Signed)
Remote ICD transmission.   

## 2023-11-15 ENCOUNTER — Other Ambulatory Visit: Payer: Self-pay | Admitting: Cardiology

## 2023-11-18 ENCOUNTER — Other Ambulatory Visit: Payer: Self-pay | Admitting: Cardiology

## 2023-12-02 ENCOUNTER — Other Ambulatory Visit: Payer: Self-pay | Admitting: Family Medicine

## 2023-12-02 DIAGNOSIS — J449 Chronic obstructive pulmonary disease, unspecified: Secondary | ICD-10-CM

## 2023-12-03 NOTE — Telephone Encounter (Signed)
Please call the patient to see if he is okay with switching to [facility] or Advair.  His insurance company is not covering Symbicort and it appears he has been on Symbicort for several years.  If he wants to stick with the Symbicort we would probably have to file a prior authorization request.

## 2023-12-03 NOTE — Telephone Encounter (Signed)
Called pt LVM to contact the office °

## 2023-12-04 ENCOUNTER — Telehealth: Payer: Self-pay

## 2023-12-04 ENCOUNTER — Other Ambulatory Visit: Payer: Self-pay | Admitting: Family Medicine

## 2023-12-04 MED ORDER — BUDESONIDE-FORMOTEROL FUMARATE 160-4.5 MCG/ACT IN AERO: 2.0000 | INHALATION_SPRAY | Freq: Two times a day (BID) | RESPIRATORY_TRACT | 11 refills | Status: DC

## 2023-12-04 MED ORDER — FLUTICASONE-SALMETEROL 115-21 MCG/ACT IN AERO: 2.0000 | INHALATION_SPRAY | Freq: Two times a day (BID) | RESPIRATORY_TRACT | 12 refills | Status: DC

## 2023-12-04 NOTE — Telephone Encounter (Signed)
Called pt LVM to contact the office °

## 2024-01-03 ENCOUNTER — Ambulatory Visit: Payer: 59

## 2024-01-03 DIAGNOSIS — Z0000 Encounter for general adult medical examination without abnormal findings: Secondary | ICD-10-CM | POA: Diagnosis not present

## 2024-01-03 NOTE — Progress Notes (Signed)
Subjective:   Kerry Mason is a 68 y.o. male who presents for Medicare Annual/Subsequent preventive examination.  Visit Complete: Virtual I connected with  Kerry Mason on 01/03/24 by a video and audio enabled telemedicine application and verified that I am speaking with the correct person using two identifiers.  Patient Location: Home  Provider Location: Home Office  I discussed the limitations of evaluation and management by telemedicine. The patient expressed understanding and agreed to proceed.  Vital Signs: Because this visit was a virtual/telehealth visit, some criteria may be missing or patient reported. Any vitals not documented were not able to be obtained and vitals that have been documented are patient reported.    Cardiac Risk Factors include: advanced age (>2men, >71 women);dyslipidemia;hypertension;male gender     Objective:    Today's Vitals   01/03/24 1008  PainSc: 7    There is no height or weight on file to calculate BMI.     01/03/2024   10:14 AM 08/10/2022    2:05 PM 03/09/2022    7:41 AM 02/21/2022    8:33 AM 02/13/2022    2:20 PM 11/24/2021    9:46 AM 01/04/2021    4:00 PM  Advanced Directives  Does Patient Have a Medical Advance Directive? No No No No No No No  Would patient like information on creating a medical advance directive?  No - Patient declined Yes (MAU/Ambulatory/Procedural Areas - Information given) No - Patient declined  No - Patient declined No - Patient declined    Current Medications (verified) Outpatient Encounter Medications as of 01/03/2024  Medication Sig   allopurinol (ZYLOPRIM) 300 MG tablet Take 1 tablet (300 mg total) by mouth 2 (two) times daily.   Ascorbic Acid (VITAMIN C) 1000 MG tablet Take 1,000 mg by mouth daily.   aspirin EC 81 MG tablet Take 81 mg by mouth daily.   atorvastatin (LIPITOR) 80 MG tablet TAKE 1 TABLET BY MOUTH EVERY DAY   budesonide-formoterol (SYMBICORT) 160-4.5 MCG/ACT inhaler Inhale 2 puffs into the  lungs 2 (two) times daily.   carvedilol (COREG) 6.25 MG tablet TAKE 1 TABLET BY MOUTH TWICE A DAY   cholecalciferol (VITAMIN D3) 25 MCG (1000 UNIT) tablet Take 1,000 Units by mouth daily.   colchicine 0.6 MG tablet Take 1-2 tablets (0.6-1.2 mg total) by mouth See admin instructions. TAKE 2 TABLETS ONE TIME. THEN TAKE 1 TABLET AFTER ONE HOUR. THEN TAKE 1 TABLET DAILY FOR 5 DAYS.   ELIQUIS 5 MG TABS tablet TAKE 1 TABLET BY MOUTH TWICE A DAY   empagliflozin (JARDIANCE) 10 MG TABS tablet TAKE 1 TABLET BY MOUTH EVERY DAY   ezetimibe (ZETIA) 10 MG tablet TAKE 1 TABLET BY MOUTH EVERY DAY   furosemide (LASIX) 40 MG tablet Take 1 tablet (40 mg total) by mouth daily.   Multiple Vitamin (MULTIVITAMIN) tablet Take 1 tablet by mouth daily.   nitroGLYCERIN (NITROSTAT) 0.4 MG SL tablet DISSOLVE 1 TABLET UNDER THE TONGUE EVERY 5 MINUTES AS NEEDED FOR CHEST PAIN. MAX 3 DOSES/15 MIN   sacubitril-valsartan (ENTRESTO) 24-26 MG TAKE 1 TABLET BY MOUTH TWICE A DAY   spironolactone (ALDACTONE) 25 MG tablet TAKE 1 TABLET BY MOUTH EVERYDAY AT BEDTIME   Calcium Carbonate-Vit D-Min (CALCIUM 1200 PO) Take 1 capsule by mouth daily. (Patient not taking: Reported on 01/03/2024)   OMEGA-3 FATTY ACIDS PO Take 1,200 mg by mouth daily. (Patient not taking: Reported on 01/03/2024)   sildenafil (REVATIO) 20 MG tablet Take 3 to 5 tablets daily as needed (  Patient not taking: Reported on 01/03/2024)   tamsulosin (FLOMAX) 0.4 MG CAPS capsule Take 0.4 mg by mouth daily. (Patient not taking: Reported on 07/18/2023)   tiotropium (SPIRIVA) 18 MCG inhalation capsule INHALE 1 CAPSULE VIA HANDIHALER ONCE DAILY AT THE SAME TIME EVERY DAY (Patient not taking: Reported on 01/03/2024)   traZODone (DESYREL) 100 MG tablet Take 1 tablet (100 mg total) by mouth at bedtime as needed for sleep. (Patient not taking: Reported on 01/03/2024)   Varenicline Tartrate, Starter, (CHANTIX STARTING MONTH PAK) 0.5 MG X 11 & 1 MG X 42 TBPK Take one 0.5 mg tablet by mouth  once daily for 3 days, then increase to one 0.5 mg tablet twice daily for 4 days, then increase to one 1 mg tablet twice daily. (Patient not taking: Reported on 01/03/2024)   No facility-administered encounter medications on file as of 01/03/2024.    Allergies (verified) Patient has no known allergies.   History: Past Medical History:  Diagnosis Date   AAA (abdominal aortic aneurysm) (HCC)    Acute systolic CHF (congestive heart failure) (HCC)    AICD (automatic cardioverter/defibrillator) present    Anxiety    Ascending aortic aneurysm (HCC) 08/31/2017   Back pain 08/31/2017   Basal cell carcinoma    left arm   CAD (coronary artery disease)    a. 2000: s/p stent of RCA 2000 with BMS  b. 2015: STEMI s/p LHC with old occlusion of RCA and DESx2 to LAD  c. 02/03/15: complicated PCI on 3/2 for CTO of mid RCA with coronary perforation and cardiac tamponade requiring emergent pericardiocentesis      Cardiac tamponade    a. 02/03/15 2/2 coronary perforation during CTO procedure. Sealed with graftmaster coated stent.    COPD (chronic obstructive pulmonary disease) (HCC) 08/31/2017   Gout    History of pneumonia    HTN (hypertension)    Hyperlipidemia    Ischemic cardiomyopathy    a. 2D ECHO: EF 25-30%. 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   LIMA to LAD, SVG to ramus intermediate branch, EVH via right thigh    Shortness of breath dyspnea    Tobacco abuse    Urinary frequency    Past Surgical History:  Procedure Laterality Date   ABDOMINAL AORTIC ENDOVASCULAR STENT GRAFT N/A 08/18/2022   Procedure: ABDOMINAL AORTIC ENDOVASCULAR STENT GRAFT;  Surgeon: Maeola Harman, MD;  Location: [facility] OR;  Service: Vascular;  Laterality: N/A;   CARDIAC CATHETERIZATION     CARDIAC CATHETERIZATION N/A 06/14/2015   Procedure: Left Heart Cath and Coronary Angiography;  Surgeon: Runell Gess, MD;   Location[facility] INVASIVE CV LAB;  Service: Cardiovascular;  Laterality: N/A;   CARDIAC CATHETERIZATION N/A 08/18/2015   Procedure: Intravascular Pressure Wire/FFR Study;  Surgeon: Peter M Swaziland, MD;  Location: [facility] INVASIVE CV LAB;  Service: Cardiovascular;  Laterality: N/A;   COLONOSCOPY     COLONOSCOPY WITH PROPOFOL N/A 03/09/2022   Procedure: COLONOSCOPY WITH PROPOFOL;  Surgeon: Benancio Deeds, MD;  Location: [facility] ENDOSCOPY;  Service: Gastroenterology;  Laterality: N/A;   CORONARY ANGIOPLASTY  2000   CORONARY ANGIOPLASTY WITH STENT PLACEMENT  2015   CORONARY ARTERY BYPASS GRAFT N/A 09/02/2015   Procedure: CORONARY ARTERY BYPASS GRAFTING (CABG) x 2 (LIMA-LAD, SVG-Intermediate) ENDOSCOPIC GREATER SAPHENOUS VEIN HARVEST RIGHT THIGH;  Surgeon: Purcell Nails, MD;  Location: [facility] OR;  Service: Open Heart Surgery;  Laterality: N/A;   CORONARY BALLOON ANGIOPLASTY N/A 01/04/2021   Procedure: CORONARY BALLOON ANGIOPLASTY;  Surgeon: Corky Crafts, MD;  Location: [facility] INVASIVE CV LAB;  Service: Cardiovascular;  Laterality: N/A;   FOOT SURGERY     IMPLANTABLE CARDIOVERTER DEFIBRILLATOR IMPLANT N/A 04/17/2014   Procedure: IMPLANTABLE CARDIOVERTER DEFIBRILLATOR IMPLANT;  Surgeon: Marinus Maw, MD;  Location: [facility] CATH LAB;  Service: Cardiovascular;  Laterality: N/A;   LEFT HEART CATH AND CORONARY ANGIOGRAPHY N/A 01/04/2021   Procedure: LEFT HEART CATH AND CORONARY ANGIOGRAPHY;  Surgeon: Corky Crafts, MD;  Location: [facility] INVASIVE CV LAB;  Service: Cardiovascular;  Laterality: N/A;   LEFT HEART CATH AND CORS/GRAFTS ANGIOGRAPHY N/A 08/30/2017   Procedure: LEFT HEART CATH AND CORS/GRAFTS ANGIOGRAPHY;  Surgeon: Yvonne Kendall, MD;  Location: [facility] INVASIVE CV LAB;  Service: Cardiovascular;  Laterality: N/A;   LEFT HEART CATHETERIZATION WITH CORONARY ANGIOGRAM N/A 12/11/2013   Procedure: LEFT HEART CATHETERIZATION WITH CORONARY ANGIOGRAM;  Surgeon: Peter M Swaziland, MD;  Location: [facility] CATH LAB;  Service:  Cardiovascular;  Laterality: N/A;   LEFT HEART CATHETERIZATION WITH CORONARY ANGIOGRAM N/A 01/05/2015   Procedure: LEFT HEART CATHETERIZATION WITH CORONARY ANGIOGRAM;  Surgeon: Peter M Swaziland, MD;  Location: [facility] CATH LAB;  Service: Cardiovascular;  Laterality: N/A;   PERCUTANEOUS CORONARY STENT INTERVENTION (PCI-S)  12/11/2013   Procedure: PERCUTANEOUS CORONARY STENT INTERVENTION (PCI-S);  Surgeon: Peter M Swaziland, MD;  Location: [facility] CATH LAB;  Service: Cardiovascular;;  prov LAD and mid LAD   PERCUTANEOUS CORONARY STENT INTERVENTION (PCI-S) N/A 02/03/2015   Procedure: PERCUTANEOUS CORONARY STENT INTERVENTION (PCI-S);  Surgeon: Corky Crafts, MD;  Location: [facility] CATH LAB;  Service: Cardiovascular;  Laterality: N/A;   POLYPECTOMY  03/09/2022   Procedure: POLYPECTOMY;  Surgeon: Benancio Deeds, MD;  Location: [facility] ENDOSCOPY;  Service: Gastroenterology;;   SPERMATOCELECTOMY Right 02/21/2022   Procedure: RIGHT SPERMATOCELECTOMY;  Surgeon: Bjorn Pippin, MD;  Location: [facility] ORS;  Service: Urology;  Laterality: Right;   TEE WITHOUT CARDIOVERSION N/A 09/02/2015   Procedure: TRANSESOPHAGEAL ECHOCARDIOGRAM (TEE);  Surgeon: Purcell Nails, MD;  Location: [facility] OR;  Service: Open Heart Surgery;  Laterality: N/A;   ULTRASOUND GUIDANCE FOR VASCULAR ACCESS Bilateral 08/18/2022   Procedure: ULTRASOUND GUIDANCE FOR VASCULAR ACCESS, BILATERAL FEMORAL ARTERIES;  Surgeon: Maeola Harman, MD;  Location: [facility] OR;  Service: Vascular;  Laterality: Bilateral;   Family History  Problem Relation Age of Onset   CAD Father        PTCA   Cancer Mother        LYMPHOMA   Social History   Socioeconomic History   Marital status: Divorced    Spouse name: Not on file   Number of children: 2   Years of education: Not on file   Highest education level: Not on file  Occupational History   Occupation: [employer]  Tobacco Use   Smoking status: Every Day    Current packs/day: 0.50    Average packs/day: 0.5  packs/day for 35.0 years (17.5 ttl pk-yrs)    Types: Cigarettes    Passive exposure: Never   Smokeless tobacco: Never  Vaping Use   Vaping status: Never Used  Substance and Sexual Activity   Alcohol use: No    Alcohol/week: 0.0 standard drinks of alcohol   Drug use: No   Sexual activity: Not Currently  Other Topics Concern   Not on file  Social History Narrative   Not on file   Social Drivers of  Health   Financial Resource Strain: Low Risk  (01/03/2024)   Overall Financial Resource Strain (CARDIA)    Difficulty of Paying Living Expenses: Not hard at all  Food Insecurity: No Food Insecurity (01/03/2024)   Hunger Vital Sign    Worried About Running Out of Food in the Last Year: Never true    Ran Out of Food in the Last Year: Never true  Transportation Needs: No Transportation Needs (01/03/2024)   PRAPARE - Administrator, Civil Service (Medical): No    Lack of Transportation (Non-Medical): No  Physical Activity: Inactive (01/03/2024)   Exercise Vital Sign    Days of Exercise per Week: 0 days    Minutes of Exercise per Session: 0 min  Stress: No Stress Concern Present (01/03/2024)   [employer] of Occupational Health - Occupational Stress Questionnaire    Feeling of Stress : Not at all  Social Connections: Moderately Isolated (01/03/2024)   Social Connection and Isolation Panel [NHANES]    Frequency of Communication with Friends and Family: More than three times a week    Frequency of Social Gatherings with Friends and Family: Once a week    Attends Religious Services: More than 4 times per year    Active Member of [employer] or Organizations: No    Attends Engineer, structural: Never    Marital Status: Divorced    Tobacco Counseling Ready to quit: No Counseling given: Not Answered   Clinical Intake:  Pre-visit preparation completed: Yes  Pain : 0-10 Pain Score: 7  Pain Type: Acute pain Pain Location: Teeth Pain Descriptors / Indicators:  Aching Pain Onset: Today Pain Frequency: Intermittent     Nutritional Risks: None Diabetes: No  How often do you need to have someone help you when you read instructions, pamphlets, or other written materials from your doctor or pharmacy?: 1 - Never  Interpreter Needed?: No  Information entered by :: NAllen LPN   Activities of Daily Living    01/03/2024   10:09 AM  In your present state of health, do you have any difficulty performing the following activities:  Hearing? 0  Vision? 0  Difficulty concentrating or making decisions? 0  Walking or climbing stairs? 0  Dressing or bathing? 0  Doing errands, shopping? 0  Preparing Food and eating ? N  Using the Toilet? N  In the past six months, have you accidently leaked urine? N  Do you have problems with loss of bowel control? N  Managing your Medications? N  Managing your Finances? N  Housekeeping or managing your Housekeeping? N    Patient Care Team: Sandre Kitty, MD as PCP - General (Family Medicine) Swaziland, Peter M, MD as PCP - Cardiology (Cardiology)  Indicate any recent Medical Services you may have received from other than [facility] providers in the past year (date may be approximate).     Assessment:   This is a routine wellness examination for Kerry Mason.  Hearing/Vision screen Hearing Screening - Comments:: Denies hearing issues Vision Screening - Comments:: Regular eye exams, [facility]   Goals Addressed             This Visit's Progress    Patient Stated       01/03/2024, denies goals       Depression Screen    01/03/2024   10:16 AM 06/01/2023    9:36 AM 03/29/2022   10:20 AM 01/04/2022    8:56 AM 12/14/2021    8:48 AM 11/01/2015  12:14 PM 03/17/2015    2:50 PM  PHQ 2/9 Scores  PHQ - 2 Score 0 0 0 0 0 0 0  PHQ- 9 Score 1 5 2  0 1      Fall Risk    01/03/2024   10:15 AM 06/01/2023    9:36 AM 04/13/2023   10:51 AM 01/04/2022    8:56 AM 12/14/2021    8:48 AM  Fall Risk   Falls in the past year? 0 1 1 0  0  Number falls in past yr: 0 0 0 0 0  Injury with Fall? 0 1 1 0 0  Risk for fall due to : Medication side effect History of fall(s)     Follow up Falls prevention discussed;Falls evaluation completed Falls evaluation completed Falls evaluation completed Falls evaluation completed Falls evaluation completed    MEDICARE RISK AT HOME: Medicare Risk at Home Any stairs in or around the home?: Yes If so, are there any without handrails?: No Home free of loose throw rugs in walkways, pet beds, electrical cords, etc?: Yes Adequate lighting in your home to reduce risk of falls?: Yes Life alert?: No Use of a cane, walker or w/c?: No Grab bars in the bathroom?: Yes Shower chair or bench in shower?: No Elevated toilet seat or a handicapped toilet?: Yes  TIMED UP AND GO:  Was the test performed?  No    Cognitive Function:        01/03/2024   10:17 AM 01/04/2022    8:41 AM  6CIT Screen  What Year? 0 points 0 points  What month? 0 points 0 points  What time? 0 points 0 points  Count back from 20 0 points 0 points  Months in reverse 0 points 0 points  Repeat phrase 2 points 0 points  Total Score 2 points 0 points    Immunizations Immunization History  Administered Date(s) Administered   Influenza,inj,Quad PF,6+ Mos 09/14/2015, 08/30/2017   Moderna Sars-Covid-2 Vaccination 02/03/2020, 03/02/2020, 11/25/2020   PNEUMOCOCCAL CONJUGATE-20 10/18/2021   Pneumococcal Polysaccharide-23 12/12/2013, 08/30/2017   Zoster Recombinant(Shingrix) 01/24/2022    TDAP status: Due, Education has been provided regarding the importance of this vaccine. Advised may receive this vaccine at local pharmacy or Health Dept. Aware to provide a copy of the vaccination record if obtained from local pharmacy or Health Dept. Verbalized acceptance and understanding.  Flu Vaccine status: Due, Education has been provided regarding the importance of this vaccine. Advised may receive this vaccine at local pharmacy or  Health Dept. Aware to provide a copy of the vaccination record if obtained from local pharmacy or Health Dept. Verbalized acceptance and understanding.  Pneumococcal vaccine status: Up to date  Covid-19 vaccine status: Information provided on how to obtain vaccines.   Qualifies for Shingles Vaccine? Yes   Zostavax completed Yes   Shingrix Completed?: Yes  Screening Tests Health Maintenance  Topic Date Due   Hepatitis C Screening  Never done   DTaP/Tdap/Td (1 - Tdap) Never done   Zoster Vaccines- Shingrix (2 of 2) 03/21/2022   COVID-19 Vaccine (4 - 2024-25 season) 08/05/2023   INFLUENZA VACCINE  03/03/2024 (Originally 07/05/2023)   Medicare Annual Wellness (AWV)  01/02/2025   Colonoscopy  03/09/2032   Pneumonia Vaccine 76+ Years old  Completed   HPV VACCINES  Aged Out    Health Maintenance  Health Maintenance Due  Topic Date Due   Hepatitis C Screening  Never done   DTaP/Tdap/Td (1 - Tdap) Never done  Zoster Vaccines- Shingrix (2 of 2) 03/21/2022   COVID-19 Vaccine (4 - 2024-25 season) 08/05/2023    Colorectal cancer screening: Type of screening: Colonoscopy. Completed 03/09/2022. Repeat every 10 years  Lung Cancer Screening: (Low Dose CT Chest recommended if Age 36-80 years, 20 pack-year currently smoking OR have quit w/in 15years.) does not qualify.   Lung Cancer Screening Referral: no  Additional Screening:  Hepatitis C Screening: does qualify;  Vision Screening: Recommended annual ophthalmology exams for early detection of glaucoma and other disorders of the eye. Is the patient up to date with their annual eye exam?  Yes  Who is the provider or what is the name of the office in which the patient attends annual eye exams? [facility] If pt is not established with a provider, would they like to be referred to a provider to establish care? No .   Dental Screening: Recommended annual dental exams for proper oral hygiene  Diabetic Foot Exam: n/a  Community Resource  Referral / Chronic Care Management: CRR required this visit?  No   CCM required this visit?  No     Plan:     I have personally reviewed and noted the following in the patient's chart:   Medical and social history Use of alcohol, tobacco or illicit drugs  Current medications and supplements including opioid prescriptions. Patient is not currently taking opioid prescriptions. Functional ability and status Nutritional status Physical activity Advanced directives List of other physicians Hospitalizations, surgeries, and ER visits in previous 12 months Vitals Screenings to include cognitive, depression, and falls Referrals and appointments  In addition, I have reviewed and discussed with patient certain preventive protocols, quality metrics, and best practice recommendations. A written personalized care plan for preventive services as well as general preventive health recommendations were provided to patient.     Barb Merino, LPN   1/61/0960   After Visit Summary: (MyChart) Due to this being a telephonic visit, the after visit summary with patients personalized plan was offered to patient via MyChart   Nurse Notes: none

## 2024-01-03 NOTE — Patient Instructions (Signed)
Kerry Mason , Thank you for taking time to come for your Medicare Wellness Visit. I appreciate your ongoing commitment to your health goals. Please review the following plan we discussed and let me know if I can assist you in the future.   Referrals/Orders/Follow-Ups/Clinician Recommendations: none  This is a list of the screening recommended for you and due dates:  Health Maintenance  Topic Date Due   Hepatitis C Screening  Never done   DTaP/Tdap/Td vaccine (1 - Tdap) Never done   Zoster (Shingles) Vaccine (2 of 2) 03/21/2022   COVID-19 Vaccine (4 - 2024-25 season) 08/05/2023   Flu Shot  03/03/2024*   Medicare Annual Wellness Visit  01/02/2025   Colon Cancer Screening  03/09/2032   Pneumonia Vaccine  Completed   HPV Vaccine  Aged Out  *Topic was postponed. The date shown is not the original due date.    Advanced directives: (ACP Link)Information on Advanced Care Planning can be found at [facility] Advance Health Care Directives Advance Health Care Directives ([email or web address]   Next Medicare Annual Wellness Visit scheduled for next year: Yes  insert Preventive Care attachment Insert FALL PREVENTION attachment if needed

## 2024-01-19 ENCOUNTER — Other Ambulatory Visit: Payer: Self-pay | Admitting: Cardiology

## 2024-01-21 NOTE — Telephone Encounter (Signed)
Prescription refill request for Eliquis received. Indication:afib Last office visit:7/24 Scr:0.98  6/24 Age: 68 Weight:101.2  kg  Prescription refilled

## 2024-03-22 ENCOUNTER — Other Ambulatory Visit: Payer: Self-pay | Admitting: Family Medicine

## 2024-03-22 DIAGNOSIS — M109 Gout, unspecified: Secondary | ICD-10-CM

## 2024-04-10 ENCOUNTER — Ambulatory Visit (INDEPENDENT_AMBULATORY_CARE_PROVIDER_SITE_OTHER): Payer: Self-pay

## 2024-04-10 DIAGNOSIS — I255 Ischemic cardiomyopathy: Secondary | ICD-10-CM | POA: Diagnosis not present

## 2024-04-10 LAB — CUP PACEART REMOTE DEVICE CHECK
Battery Voltage: 10
Date Time Interrogation Session: 20250508090123
Implantable Lead Connection Status: 753985
Implantable Lead Implant Date: 20150515
Implantable Lead Location: 753860
Implantable Lead Model: 365
Implantable Lead Serial Number: 10579264
Implantable Pulse Generator Implant Date: 20150515
Pulse Gen Model: 383594
Pulse Gen Serial Number: 60800912

## 2024-04-14 ENCOUNTER — Encounter: Payer: Self-pay | Admitting: Internal Medicine

## 2024-04-29 ENCOUNTER — Ambulatory Visit: Payer: Self-pay

## 2024-04-29 NOTE — Telephone Encounter (Signed)
 I'm going to need to evaluate him before I treat a tooth abscess and DIY dentistry.  Is there something preventing him from coming to an appointment here other than not wanting to?

## 2024-04-29 NOTE — Telephone Encounter (Signed)
  Chief Complaint: facial swelling after pulling his own tooth Symptoms: patient noticed abscess on 5/23, pulled his tooth on 5/24 Frequency: three days Pertinent Negatives: Patient denies fever/pain Disposition: [] ED /[] Urgent Care (no appt availability in office) / [] Appointment(In office/virtual)/ []  [place] [facility]/ [] Home Care/ [x] Refused Recommended Disposition /[] [place] [facility]/ []  Follow-up with PCP Additional Notes: patient with residual facial swelling after pulling his own tooth on 5/24. Requesting antibiotic to be called in. Refused office visit or UC visit today.  Advised to call back or proceed to UC for increased pain, swelling, or fever while waiting response from office for antibiotics. Patient verbalizes understanding Summary: Swelling   Copied From CRM [id number]. Reason for Triage: Pt removed an abscessed tooth this weekend himself, he is requesting an antibiotic. He is experiencing swelling  Best contact: [phone]         Reason for Disposition  Face is very swollen  Answer Assessment - Initial Assessment Questions 1. SYMPTOM: "What's the main symptom you're concerned about?" (e.g., bleeding, pain, fever)     Upper left tooth abscess, patient pulled it 2. ONSET: "When did the pain  start?"     Friday, pain started 3. DATE of TOOTH EXTRACTION: "When was the surgery performed?"      Pulled himself on Sunday 4. BLEEDING: "Is there any bleeding?" If Yes, ask: "How much?"      no 5. PAIN: "Is there any pain?" If Yes, ask: "How bad is it?"  (Scale 1-10; or mild, moderate, severe)   - MILD (1-3): doesn't interfere with chewing    - MODERATE (4-7): interferes with chewing, interferes with normal activities, awakens from sleep     - SEVERE (8-10): unable to eat, unable to do any normal activities, excruciating pain        no 6. FEVER: "Do you have a fever?" If Yes, ask: "What is your temperature, how was it measured, and when did it start?"     no 7.  OTHER SYMPTOMS: "Do you have any other symptoms?" (e.g., face swelling)     Face swelling  Protocols used: Tooth Extraction-A-AH

## 2024-05-12 ENCOUNTER — Ambulatory Visit (INDEPENDENT_AMBULATORY_CARE_PROVIDER_SITE_OTHER)

## 2024-05-12 DIAGNOSIS — I5022 Chronic systolic (congestive) heart failure: Secondary | ICD-10-CM

## 2024-05-12 DIAGNOSIS — I255 Ischemic cardiomyopathy: Secondary | ICD-10-CM

## 2024-05-12 LAB — CUP PACEART REMOTE DEVICE CHECK
Date Time Interrogation Session: 20250609095826
Implantable Lead Connection Status: 753985
Implantable Lead Implant Date: 20150515
Implantable Lead Location: 753860
Implantable Lead Model: 365
Implantable Lead Serial Number: 10579264
Implantable Pulse Generator Implant Date: 20150515
Pulse Gen Model: 383594
Pulse Gen Serial Number: 60800912

## 2024-05-16 ENCOUNTER — Ambulatory Visit: Payer: Self-pay | Admitting: Internal Medicine

## 2024-05-19 NOTE — Progress Notes (Signed)
 Remote ICD transmission.

## 2024-06-03 ENCOUNTER — Other Ambulatory Visit: Payer: Self-pay | Admitting: Cardiology

## 2024-06-12 ENCOUNTER — Ambulatory Visit

## 2024-06-25 NOTE — Progress Notes (Signed)
 Remote ICD transmission.

## 2024-06-26 LAB — CUP PACEART REMOTE DEVICE CHECK
Battery Remaining Percentage: 7 %
Battery Voltage: 2.94 V
Brady Statistic AS VP Percent: 0 %
Brady Statistic AS VS Percent: 99 %
Brady Statistic RV Percent Paced: 0 %
Date Time Interrogation Session: 20250711005658
HighPow Impedance: 75 Ohm
HighPow Impedance: 93 Ohm
Implantable Lead Connection Status: 753985
Implantable Lead Implant Date: 20150515
Implantable Lead Location: 753860
Implantable Lead Model: 365
Implantable Lead Serial Number: 10579264
Implantable Pulse Generator Implant Date: 20150515
Lead Channel Impedance Value: 637 Ohm
Lead Channel Setting Pacing Amplitude: 2.5 V
Lead Channel Setting Pacing Pulse Width: 0.4 ms
Lead Channel Setting Sensing Sensitivity: 0.8 mV
Pulse Gen Model: 383594
Pulse Gen Serial Number: 60800912
Zone Setting Status: 755011

## 2024-06-27 ENCOUNTER — Other Ambulatory Visit: Payer: Self-pay | Admitting: Cardiology

## 2024-06-29 ENCOUNTER — Ambulatory Visit: Payer: Self-pay | Admitting: Internal Medicine

## 2024-07-07 ENCOUNTER — Encounter (HOSPITAL_BASED_OUTPATIENT_CLINIC_OR_DEPARTMENT_OTHER): Payer: Self-pay

## 2024-07-09 ENCOUNTER — Ambulatory Visit: Admitting: Internal Medicine

## 2024-07-09 ENCOUNTER — Telehealth: Payer: Self-pay

## 2024-07-09 NOTE — Telephone Encounter (Signed)
 Call placed to [facility].  Per representative, Pt has 6 months left to ERI based on current device parameters.

## 2024-07-10 ENCOUNTER — Ambulatory Visit: Admitting: Internal Medicine

## 2024-07-14 ENCOUNTER — Ambulatory Visit

## 2024-07-14 DIAGNOSIS — I255 Ischemic cardiomyopathy: Secondary | ICD-10-CM

## 2024-07-15 LAB — CUP PACEART REMOTE DEVICE CHECK
Battery Voltage: 6
Date Time Interrogation Session: 20250811114310
Implantable Lead Connection Status: 753985
Implantable Lead Implant Date: 20150515
Implantable Lead Location: 753860
Implantable Lead Model: 365
Implantable Lead Serial Number: 10579264
Implantable Pulse Generator Implant Date: 20150515
Pulse Gen Model: 383594
Pulse Gen Serial Number: 60800912

## 2024-07-16 ENCOUNTER — Ambulatory Visit: Payer: Self-pay | Admitting: Internal Medicine

## 2024-07-17 ENCOUNTER — Ambulatory Visit: Attending: Internal Medicine | Admitting: Internal Medicine

## 2024-07-17 ENCOUNTER — Other Ambulatory Visit: Payer: Self-pay

## 2024-07-17 ENCOUNTER — Encounter: Payer: Self-pay | Admitting: Internal Medicine

## 2024-07-17 VITALS — BP 102/66 | HR 74 | Ht 73.0 in | Wt 206.0 lb

## 2024-07-17 DIAGNOSIS — I493 Ventricular premature depolarization: Secondary | ICD-10-CM

## 2024-07-17 DIAGNOSIS — I5022 Chronic systolic (congestive) heart failure: Secondary | ICD-10-CM

## 2024-07-17 DIAGNOSIS — I714 Abdominal aortic aneurysm, without rupture, unspecified: Secondary | ICD-10-CM

## 2024-07-17 LAB — CUP PACEART INCLINIC DEVICE CHECK
Date Time Interrogation Session: 20250814145341
HighPow Impedance: 99 Ohm
Implantable Lead Connection Status: 753985
Implantable Lead Implant Date: 20150515
Implantable Lead Location: 753860
Implantable Lead Model: 365
Implantable Lead Serial Number: 10579264
Implantable Pulse Generator Implant Date: 20150515
Lead Channel Impedance Value: 723 Ohm
Lead Channel Pacing Threshold Amplitude: 0.3 V
Lead Channel Pacing Threshold Pulse Width: 0.4 ms
Lead Channel Sensing Intrinsic Amplitude: 24.2 mV
Lead Channel Sensing Intrinsic Amplitude: 5.8 mV
Pulse Gen Model: 383594
Pulse Gen Serial Number: 60800912

## 2024-07-17 NOTE — Patient Instructions (Signed)
 Medication Instructions:  Your physician recommends that you continue on your current medications as directed. Please refer to the Current Medication list given to you today.  *If you need a refill on your cardiac medications before your next appointment, please call your pharmacy*  Lab Work: None ordered.  You may go to any [facility] Location for your lab work:  [facility] - [address] ([facility]) - [address] [phone]  [place] - [address]   [place]  - [address]   [place] - [address] - [address] [employer]  - [address] [place] - [address] ([facility]   If you have labs (blood work) drawn today and your tests are completely normal, you will receive your results only by: [employer] (if you have MyChart)  If you have any lab test that is abnormal or we need to change your treatment, we will call you or send a MyChart message to review the results.  Testing/Procedures: None ordered.  Follow-Up: At [facility], you and your health needs are our priority.  As part of our continuing mission to provide you with exceptional heart care, we have created designated Provider Care Teams.  These Care Teams include your primary Cardiologist (physician) and Advanced Practice Providers (APPs -  Physician Assistants and Nurse Practitioners) who all work together to provide you with the care you need, when you need it.  Your next appointment:   9 months  The format for your next appointment:   In Person  Provider:   Donnice Primus, MD or one of the following Advanced Practice Providers on your designated Care Team:   Charlies Arthur, [place] Ozell Jodie Passey, [place] Leotis Barrack, NP  Note: Remote monitoring is used to monitor your Pacemaker/ ICD from home. This monitoring reduces the number of office visits required to check  your device to one time per year. It allows us  to keep an eye on the functioning of your device to ensure it is working properly.

## 2024-07-17 NOTE — Progress Notes (Signed)
 HPI Kerry Mason returns today for followup. He is a pleasant 68 yo man with CAD, s/p CABG, remote PCI's, and chronic systolic heart failure, s/p Biotornik VDD ICD Insertion. He has done well in the interim. He denies chest pain or sob. He is still smoking but trying to stop. He is not on oxygen but has significant COPD/emphysema.  No Known Allergies   Current Outpatient Medications  Medication Sig Dispense Refill   allopurinol  (ZYLOPRIM ) 300 MG tablet TAKE 1 TABLET BY MOUTH 2 TIMES DAILY. 180 tablet 1   Ascorbic Acid (VITAMIN C) 1000 MG tablet Take 1,000 mg by mouth daily.     aspirin  EC 81 MG tablet Take 81 mg by mouth daily.     atorvastatin  (LIPITOR ) 80 MG tablet TAKE 1 TABLET BY MOUTH EVERY DAY 90 tablet 3   budesonide -formoterol  (SYMBICORT ) 160-4.5 MCG/ACT inhaler Inhale 2 puffs into the lungs 2 (two) times daily. 1 each 11   Calcium  Carbonate-Vit D-Min (CALCIUM  1200 PO) Take 1 capsule by mouth daily.     carvedilol  (COREG ) 6.25 MG tablet TAKE 1 TABLET BY MOUTH TWICE A DAY 180 tablet 3   cholecalciferol (VITAMIN D3) 25 MCG (1000 UNIT) tablet Take 1,000 Units by mouth daily.     colchicine  0.6 MG tablet Take 1-2 tablets (0.6-1.2 mg total) by mouth See admin instructions. TAKE 2 TABLETS ONE TIME. THEN TAKE 1 TABLET AFTER ONE HOUR. THEN TAKE 1 TABLET DAILY FOR 5 DAYS.     ELIQUIS  5 MG TABS tablet TAKE 1 TABLET BY MOUTH TWICE A DAY 60 tablet 5   empagliflozin  (JARDIANCE ) 10 MG TABS tablet TAKE 1 TABLET BY MOUTH EVERY DAY 90 tablet 3   ezetimibe  (ZETIA ) 10 MG tablet TAKE 1 TABLET BY MOUTH EVERY DAY 90 tablet 1   furosemide  (LASIX ) 40 MG tablet Take 1 tablet (40 mg total) by mouth daily. 90 tablet 3   Multiple Vitamin (MULTIVITAMIN) tablet Take 1 tablet by mouth daily.     nitroGLYCERIN  (NITROSTAT ) 0.4 MG SL tablet DISSOLVE 1 TABLET UNDER THE TONGUE EVERY 5 MINUTES AS NEEDED FOR CHEST PAIN. MAX 3 DOSES/15 MIN 75 tablet 3   sacubitril -valsartan  (ENTRESTO ) 24-26 MG TAKE 1 TABLET BY  MOUTH TWICE A DAY 180 tablet 2   spironolactone  (ALDACTONE ) 25 MG tablet TAKE 1 TABLET BY MOUTH EVERYDAY AT BEDTIME 90 tablet 3   tamsulosin  (FLOMAX ) 0.4 MG CAPS capsule Take 0.4 mg by mouth daily.     OMEGA-3 FATTY ACIDS PO Take 1,200 mg by mouth daily. (Patient not taking: Reported on 07/17/2024)     sildenafil  (REVATIO ) 20 MG tablet Take 3 to 5 tablets daily as needed (Patient not taking: Reported on 07/17/2024) 100 tablet 1   tiotropium (SPIRIVA ) 18 MCG inhalation capsule INHALE 1 CAPSULE VIA HANDIHALER ONCE DAILY AT THE SAME TIME EVERY DAY (Patient not taking: Reported on 07/17/2024) 30 capsule 2   Varenicline  Tartrate, Starter, (CHANTIX  STARTING MONTH PAK) 0.5 MG X 11 & 1 MG X 42 TBPK Take one 0.5 mg tablet by mouth once daily for 3 days, then increase to one 0.5 mg tablet twice daily for 4 days, then increase to one 1 mg tablet twice daily. (Patient not taking: Reported on 07/17/2024) 53 each 0   No current facility-administered medications for this visit.     Past Medical History:  Diagnosis Date   AAA (abdominal aortic aneurysm) (HCC)    Acute systolic CHF (congestive heart failure) (HCC)    AICD (automatic cardioverter/defibrillator)  present    Anxiety    Ascending aortic aneurysm (HCC) 08/31/2017   Back pain 08/31/2017   Basal cell carcinoma    left arm   CAD (coronary artery disease)    a. 2000: s/p stent of RCA 2000 with BMS  b. 2015: STEMI s/p LHC with old occlusion of RCA and DESx2 to LAD  c. 02/03/15: complicated PCI on 3/2 for CTO of mid RCA with coronary perforation and cardiac tamponade requiring emergent pericardiocentesis      Cardiac tamponade    a. 02/03/15 2/2 coronary perforation during CTO procedure. Sealed with graftmaster coated stent.    COPD (chronic obstructive pulmonary disease) (HCC) 08/31/2017   Gout    History of pneumonia    HTN (hypertension)    Hyperlipidemia    Ischemic cardiomyopathy    a. 2D ECHO: EF 25-30%. Akinesis of the anteroseptal and   Left  main coronary artery disease    Myocardial infarction (HCC)    Obesity    S/P CABG x 2 09/02/2015   LIMA to LAD, SVG to ramus intermediate branch, EVH via right thigh    Shortness of breath dyspnea    Tobacco abuse    Urinary frequency     ROS:   All systems reviewed and negative except as noted in the HPI.   Past Surgical History:  Procedure Laterality Date   ABDOMINAL AORTIC ENDOVASCULAR STENT GRAFT N/A 08/18/2022   Procedure: ABDOMINAL AORTIC ENDOVASCULAR STENT GRAFT;  Surgeon: Sheree Penne Bruckner, MD;  Location: [facility] OR;  Service: Vascular;  Laterality: N/A;   CARDIAC CATHETERIZATION     CARDIAC CATHETERIZATION N/A 06/14/2015   Procedure: Left Heart Cath and Coronary Angiography;  Surgeon: Dorn JINNY Lesches, MD;  Location[facility] INVASIVE CV LAB;  Service: Cardiovascular;  Laterality: N/A;   CARDIAC CATHETERIZATION N/A 08/18/2015   Procedure: Intravascular Pressure Wire/FFR Study;  Surgeon: Peter M Swaziland, MD;  Location: [facility] INVASIVE CV LAB;  Service: Cardiovascular;  Laterality: N/A;   COLONOSCOPY     COLONOSCOPY WITH PROPOFOL  N/A 03/09/2022   Procedure: COLONOSCOPY WITH PROPOFOL ;  Surgeon: Leigh Elspeth SQUIBB, MD;  Location: [facility] ENDOSCOPY;  Service: Gastroenterology;  Laterality: N/A;   CORONARY ANGIOPLASTY  2000   CORONARY ANGIOPLASTY WITH STENT PLACEMENT  2015   CORONARY ARTERY BYPASS GRAFT N/A 09/02/2015   Procedure: CORONARY ARTERY BYPASS GRAFTING (CABG) x 2 (LIMA-LAD, SVG-Intermediate) ENDOSCOPIC GREATER SAPHENOUS VEIN HARVEST RIGHT THIGH;  Surgeon: Sudie VEAR Laine, MD;  Location: [facility] OR;  Service: Open Heart Surgery;  Laterality: N/A;   CORONARY BALLOON ANGIOPLASTY N/A 01/04/2021   Procedure: CORONARY BALLOON ANGIOPLASTY;  Surgeon: Dann Candyce RAMAN, MD;  Location: [facility] INVASIVE CV LAB;  Service: Cardiovascular;  Laterality: N/A;   FOOT SURGERY     IMPLANTABLE CARDIOVERTER DEFIBRILLATOR IMPLANT N/A 04/17/2014   Procedure: IMPLANTABLE CARDIOVERTER DEFIBRILLATOR IMPLANT;  Surgeon:  Danelle LELON Birmingham, MD;  Location: [facility] CATH LAB;  Service: Cardiovascular;  Laterality: N/A;   LEFT HEART CATH AND CORONARY ANGIOGRAPHY N/A 01/04/2021   Procedure: LEFT HEART CATH AND CORONARY ANGIOGRAPHY;  Surgeon: Dann Candyce RAMAN, MD;  Location: [facility] INVASIVE CV LAB;  Service: Cardiovascular;  Laterality: N/A;   LEFT HEART CATH AND CORS/GRAFTS ANGIOGRAPHY N/A 08/30/2017   Procedure: LEFT HEART CATH AND CORS/GRAFTS ANGIOGRAPHY;  Surgeon: Mady Bruckner, MD;  Location: [facility] INVASIVE CV LAB;  Service: Cardiovascular;  Laterality: N/A;   LEFT HEART CATHETERIZATION WITH CORONARY ANGIOGRAM N/A 12/11/2013   Procedure: LEFT HEART CATHETERIZATION WITH CORONARY ANGIOGRAM;  Surgeon: Peter M Swaziland, MD;  Location: [facility] CATH LAB;  Service: Cardiovascular;  Laterality: N/A;   LEFT HEART CATHETERIZATION WITH CORONARY ANGIOGRAM N/A 01/05/2015   Procedure: LEFT HEART CATHETERIZATION WITH CORONARY ANGIOGRAM;  Surgeon: Peter M Swaziland, MD;  Location: [facility] CATH LAB;  Service: Cardiovascular;  Laterality: N/A;   PERCUTANEOUS CORONARY STENT INTERVENTION (PCI-S)  12/11/2013   Procedure: PERCUTANEOUS CORONARY STENT INTERVENTION (PCI-S);  Surgeon: Peter M Swaziland, MD;  Location: [facility] CATH LAB;  Service: Cardiovascular;;  prov LAD and mid LAD   PERCUTANEOUS CORONARY STENT INTERVENTION (PCI-S) N/A 02/03/2015   Procedure: PERCUTANEOUS CORONARY STENT INTERVENTION (PCI-S);  Surgeon: Candyce GORMAN Reek, MD;  Location: [facility] CATH LAB;  Service: Cardiovascular;  Laterality: N/A;   POLYPECTOMY  03/09/2022   Procedure: POLYPECTOMY;  Surgeon: Leigh Elspeth SQUIBB, MD;  Location: [facility];  Service: Gastroenterology;;   SPERMATOCELECTOMY Right 02/21/2022   Procedure: RIGHT SPERMATOCELECTOMY;  Surgeon: Watt Rush, MD;  Location: [facility] ORS;  Service: Urology;  Laterality: Right;   TEE WITHOUT CARDIOVERSION N/A 09/02/2015   Procedure: TRANSESOPHAGEAL ECHOCARDIOGRAM (TEE);  Surgeon: Sudie VEAR Laine, MD;  Location: [facility] OR;  Service: Open Heart Surgery;   Laterality: N/A;   ULTRASOUND GUIDANCE FOR VASCULAR ACCESS Bilateral 08/18/2022   Procedure: ULTRASOUND GUIDANCE FOR VASCULAR ACCESS, BILATERAL FEMORAL ARTERIES;  Surgeon: Sheree Penne Bruckner, MD;  Location: [facility] OR;  Service: Vascular;  Laterality: Bilateral;     Family History  Problem Relation Age of Onset   CAD Father        PTCA   Cancer Mother        LYMPHOMA     Social History   Socioeconomic History   Marital status: Divorced    Spouse name: Not on file   Number of children: 2   Years of education: Not on file   Highest education level: Not on file  Occupational History   Occupation: [employer]  Tobacco Use   Smoking status: Every Day    Current packs/day: 0.50    Average packs/day: 0.5 packs/day for 35.0 years (17.5 ttl pk-yrs)    Types: Cigarettes    Passive exposure: Never   Smokeless tobacco: Never  Vaping Use   Vaping status: Never Used  Substance and Sexual Activity   Alcohol use: No    Alcohol/week: 0.0 standard drinks of alcohol   Drug use: No   Sexual activity: Not Currently  Other Topics Concern   Not on file  Social History Narrative   Not on file   Social Drivers of Health   Financial Resource Strain: Low Risk  (01/03/2024)   Overall Financial Resource Strain (CARDIA)    Difficulty of Paying Living Expenses: Not hard at all  Food Insecurity: No Food Insecurity (01/03/2024)   Hunger Vital Sign    Worried About Running Out of Food in the Last Year: Never true    Ran Out of Food in the Last Year: Never true  Transportation Needs: No Transportation Needs (01/03/2024)   PRAPARE - Administrator, Civil Service (Medical): No    Lack of Transportation (Non-Medical): No  Physical Activity: Inactive (01/03/2024)   Exercise Vital Sign    Days of Exercise per Week: 0 days    Minutes of Exercise per Session: 0 min  Stress: No Stress Concern Present (01/03/2024)   [employer] of Occupational Health - Occupational Stress  Questionnaire    Feeling of Stress : Not at all  Social Connections: Moderately Isolated (01/03/2024)   Social Connection and Isolation Panel  Frequency of Communication with Friends and Family: More than three times a week    Frequency of Social Gatherings with Friends and Family: Once a week    Attends Religious Services: More than 4 times per year    Active Member of [employer] or Organizations: No    Attends Banker Meetings: Never    Marital Status: Divorced  Catering manager Violence: Not At Risk (01/03/2024)   Humiliation, Afraid, Rape, and Kick questionnaire    Fear of Current or Ex-Partner: No    Emotionally Abused: No    Physically Abused: No    Sexually Abused: No     BP 102/66   Pulse 74   Ht 6' 1 (1.854 m)   Wt 206 lb (93.4 kg)   SpO2 93%   BMI 27.18 kg/m   Physical Exam:  Well appearing NAD HEENT: Unremarkable Neck:  No JVD, no thyromegally Lymphatics:  No adenopathy Back:  No CVA tenderness Lungs:  Clear HEART:  Regular rate rhythm, no murmurs, no rubs, no clicks Abd:  soft, positive bowel sounds, no organomegally, no rebound, no guarding Ext:  2 plus pulses, no edema, no cyanosis, no clubbing Skin:  No rashes no nodules Neuro:  CN II through XII intact, motor grossly intact  EKG - nsr  DEVICE  Normal device function.  See PaceArt for details. Approaching eri  Assess/Plan:  1. ICD - his biotronik VDD ICD is working normally. No atrial fib and no VT 2. CAD - he denies anginal symptoms. He will continue his current meds. 3. Chronic systolic heart failure - his symptoms are class 2 and he is on maximal medical therapy. 4. Tobacco abuse -he is encouraged to stop smoking.   Danelle Waddell HERO.D.

## 2024-07-22 ENCOUNTER — Other Ambulatory Visit: Payer: Self-pay | Admitting: Cardiology

## 2024-07-22 NOTE — Telephone Encounter (Signed)
 Prescription refill request for Eliquis  received. Indication:pvcs Last office visit:8/25 Drm:wzzid labs at visit Age: 68 Weight:93.4  kg  Prescription refilled

## 2024-07-29 NOTE — Progress Notes (Unsigned)
 Cardiology Office Note    Date:  08/01/2024   ID:  Kerry Mason, DOB 23-May-1956, MRN [id number]  PCP:  Chandra Toribio POUR, MD  Cardiologist:  Dr. [place]   Chief Complaint  Patient presents with   Coronary Artery Disease    History of Present Illness:  Kerry Mason is a 68 y.o. male with CAD s/p CABG and multiple PCI, chronic systolic HF s/p ICD, HLD and tobacco abuse. He had a remote stenting of RCA in 2000 and was lost to follow-up. He ended up having anterior STEMI in January 2015, cardiac catheterization showed occluded proximal RCA with collaterals and the occlusion of the proximal LAD RCA occlusion was felt to be chronic, he was treated with drug-eluting stent to the occluded LAD. EF by cath was 35%. He subsequently required ICD placement. He had Myoview in December 2015 that was high risk with large area of anterior, septal, apical scar and inferior apical ischemia, EF was noted to be 25%. He underwent repeat cardiac cath that showed patent stents in the LAD, but first diagonal had 80% stenosis, chronically occluded RCA with collaterals. Due to his anginal symptom, he underwent a CTO of RCA, procedure was complicated by perforation of mid RCA which resulted in cardiac tamponade and required emergent pericardiocentesis and removal of 1L of blood. Perforation was sealed with Graftmaster and covered stent to the mid RCA. Remainder of his RCA was stented with 4 additional drug-eluting stents. He ended up having pericarditis afterward. He did develop brief PAF during the period of pericarditis.   He was admitted in July 2016 with NSTEMI. Cardiac catheterization showed reocclusion of mid RCA with left-to-right collaterals, he was treated medically with addition of Imdur . He also had a 50% stenosis in the left main, FFR was abnormal, he was referred for CT surgery. He underwent CABG by Dr. Dusty in 9/16 with LIMA to LAD, SVG to ramus, RCA was not bypassed due to small target and significant  scarring from the prior pericarditis.   He was admitted to the hospital on 08/29/2017 with chest pain. He eventually underwent cardiac catheterization on 08/30/2017 with Dr. Mady which showed widely patent LIMA to LAD and SVG to ramus, moderate to severely reduced left ventricular contraction, multiple abnormal vessels arising from the LIMA graft, mediastinal mass is a possibility. Otherwise continue medical therapy and a secondary prevention of CAD. CT of chest showed moderate to severe upper lobe central lobar emphysema, 4.1 cm ascending aortic aneurysm, left lower paratracheal lymphadenopathy likely reactive, several scattered 2-3 mm pulmonary nodule.  He presented to [facility] in February 2022 with chest pain. Symptoms somewhat atypical with flat HST. Given his known CAD, heart cath was repeated. PCI attempted of ostial LCx, but was unsuccessful.  He was loaded with plavix  in the cath lab, but this was not continued given his need for DOAC.   Was recently seen by [facility] for follow up of AAA. Measured 5.1 cm. Noted left calf claudication and they are planneding to check ABIs on follow up visit.  On 08/18/22 S/p endovascular aortic aneurysm repair with main body right 26 x 14 x 12 extended with 16 x 12 via 16 French sheath and contralateral limb left 20 x 14 via 12 [place] sheath ironed out with M OB balloon. On 04/02/23 was seen device clinic alert for increased PVC burden 21%. Pt reported no symptoms, had been on vacation at the beach, had a trip/fall. When seen in device clinic PVC burden back down to 1%.  He now is farming raising rabbits and quail. Has rare palpitations. We tried stopping Imdur  before so he could take Viagra  but this didn't help. Notes infrequent chest pain lasting only a few seconds.     Past Medical History:  Diagnosis Date   AAA (abdominal aortic aneurysm) (HCC)    Acute systolic CHF (congestive heart failure) (HCC)    AICD (automatic cardioverter/defibrillator) present    Anxiety     Ascending aortic aneurysm (HCC) 08/31/2017   Back pain 08/31/2017   Basal cell carcinoma    left arm   CAD (coronary artery disease)    a. 2000: s/p stent of RCA 2000 with BMS  b. 2015: STEMI s/p LHC with old occlusion of RCA and DESx2 to LAD  c. 02/03/15: complicated PCI on 3/2 for CTO of mid RCA with coronary perforation and cardiac tamponade requiring emergent pericardiocentesis      Cardiac tamponade    a. 02/03/15 2/2 coronary perforation during CTO procedure. Sealed with graftmaster coated stent.    COPD (chronic obstructive pulmonary disease) (HCC) 08/31/2017   Gout    History of pneumonia    HTN (hypertension)    Hyperlipidemia    Ischemic cardiomyopathy    a. 2D ECHO: EF 25-30%. 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   LIMA to LAD, SVG to ramus intermediate branch, EVH via right thigh    Shortness of breath dyspnea    Tobacco abuse    Urinary frequency     Past Surgical History:  Procedure Laterality Date   ABDOMINAL AORTIC ENDOVASCULAR STENT GRAFT N/A 08/18/2022   Procedure: ABDOMINAL AORTIC ENDOVASCULAR STENT GRAFT;  Surgeon: Sheree Penne Bruckner, MD;  Location: [facility] OR;  Service: Vascular;  Laterality: N/A;   CARDIAC CATHETERIZATION     CARDIAC CATHETERIZATION N/A 06/14/2015   Procedure: Left Heart Cath and Coronary Angiography;  Surgeon: Dorn JINNY Lesches, MD;  Location: [facility] INVASIVE CV LAB;  Service: Cardiovascular;  Laterality: N/A;   CARDIAC CATHETERIZATION N/A 08/18/2015   Procedure: Intravascular Pressure Wire/FFR Study;  Surgeon: Bricia Taher M [place]azil[place], MD;  Location: [facility] INVASIVE CV LAB;  Service: Cardiovascular;  Laterality: N/A;   COLONOSCOPY     COLONOSCOPY WITH PROPOFOL  N/A 03/09/2022   Procedure: COLONOSCOPY WITH PROPOFOL ;  Surgeon: Leigh Elspeth SQUIBB, MD;  Location: [facility] ENDOSCOPY;  Service: Gastroenterology;  Laterality: N/A;   CORONARY ANGIOPLASTY  2000   CORONARY  ANGIOPLASTY WITH STENT PLACEMENT  2015   CORONARY ARTERY BYPASS GRAFT N/A 09/02/2015   Procedure: CORONARY ARTERY BYPASS GRAFTING (CABG) x 2 (LIMA-LAD, SVG-Intermediate) ENDOSCOPIC GREATER SAPHENOUS VEIN HARVEST RIGHT THIGH;  Surgeon: Sudie VEAR Laine, MD;  Location: [facility] OR;  Service: Open Heart Surgery;  Laterality: N/A;   CORONARY BALLOON ANGIOPLASTY N/A 01/04/2021   Procedure: CORONARY BALLOON ANGIOPLASTY;  Surgeon: Dann Candyce RAMAN, MD;  Location: [facility] INVASIVE CV LAB;  Service: Cardiovascular;  Laterality: N/A;   FOOT SURGERY     IMPLANTABLE CARDIOVERTER DEFIBRILLATOR IMPLANT N/A 04/17/2014   Procedure: IMPLANTABLE CARDIOVERTER DEFIBRILLATOR IMPLANT;  Surgeon: Danelle LELON Birmingham, MD;  Location: [facility] CATH LAB;  Service: Cardiovascular;  Laterality: N/A;   LEFT HEART CATH AND CORONARY ANGIOGRAPHY N/A 01/04/2021   Procedure: LEFT HEART CATH AND CORONARY ANGIOGRAPHY;  Surgeon: Dann Candyce RAMAN, MD;  Location: [facility] INVASIVE CV LAB;  Service: Cardiovascular;  Laterality: N/A;   LEFT HEART CATH AND CORS/GRAFTS ANGIOGRAPHY N/A 08/30/2017  Procedure: LEFT HEART CATH AND CORS/GRAFTS ANGIOGRAPHY;  Surgeon: Mady Bruckner, MD;  Location: [facility] INVASIVE CV LAB;  Service: Cardiovascular;  Laterality: N/A;   LEFT HEART CATHETERIZATION WITH CORONARY ANGIOGRAM N/A 12/11/2013   Procedure: LEFT HEART CATHETERIZATION WITH CORONARY ANGIOGRAM;  Surgeon: Bookert Guzzi M [place], MD;  Location: [facility] CATH LAB;  Service: Cardiovascular;  Laterality: N/A;   LEFT HEART CATHETERIZATION WITH CORONARY ANGIOGRAM N/A 01/05/2015   Procedure: LEFT HEART CATHETERIZATION WITH CORONARY ANGIOGRAM;  Surgeon: Rochel Privett M [place], MD;  Location: [facility] CATH LAB;  Service: Cardiovascular;  Laterality: N/A;   PERCUTANEOUS CORONARY STENT INTERVENTION (PCI-S)  12/11/2013   Procedure: PERCUTANEOUS CORONARY STENT INTERVENTION (PCI-S);  Surgeon: Kenzi Bardwell M [place], MD;  Location: [facility] CATH LAB;  Service: Cardiovascular;;  prov LAD and mid LAD   PERCUTANEOUS CORONARY STENT  INTERVENTION (PCI-S) N/A 02/03/2015   Procedure: PERCUTANEOUS CORONARY STENT INTERVENTION (PCI-S);  Surgeon: Candyce GORMAN Reek, MD;  Location: [facility] CATH LAB;  Service: Cardiovascular;  Laterality: N/A;   POLYPECTOMY  03/09/2022   Procedure: POLYPECTOMY;  Surgeon: Leigh Elspeth SQUIBB, MD;  Location: [facility];  Service: Gastroenterology;;   SPERMATOCELECTOMY Right 02/21/2022   Procedure: RIGHT SPERMATOCELECTOMY;  Surgeon: Watt Rush, MD;  Location: [facility] ORS;  Service: Urology;  Laterality: Right;   TEE WITHOUT CARDIOVERSION N/A 09/02/2015   Procedure: TRANSESOPHAGEAL ECHOCARDIOGRAM (TEE);  Surgeon: Sudie VEAR Laine, MD;  Location: [facility] OR;  Service: Open Heart Surgery;  Laterality: N/A;   ULTRASOUND GUIDANCE FOR VASCULAR ACCESS Bilateral 08/18/2022   Procedure: ULTRASOUND GUIDANCE FOR VASCULAR ACCESS, BILATERAL FEMORAL ARTERIES;  Surgeon: Sheree Penne Bruckner, MD;  Location: [facility] OR;  Service: Vascular;  Laterality: Bilateral;    Current Medications: Outpatient Medications Prior to Visit  Medication Sig Dispense Refill   allopurinol  (ZYLOPRIM ) 300 MG tablet TAKE 1 TABLET BY MOUTH 2 TIMES DAILY. 180 tablet 1   apixaban  (ELIQUIS ) 5 MG TABS tablet TAKE 1 TABLET BY MOUTH TWICE A DAY 60 tablet 0   Ascorbic Acid (VITAMIN C) 1000 MG tablet Take 1,000 mg by mouth daily.     aspirin  EC 81 MG tablet Take 81 mg by mouth daily.     atorvastatin  (LIPITOR ) 80 MG tablet TAKE 1 TABLET BY MOUTH EVERY DAY 90 tablet 3   budesonide -formoterol  (SYMBICORT ) 160-4.5 MCG/ACT inhaler Inhale 2 puffs into the lungs 2 (two) times daily. 1 each 11   Calcium  Carbonate-Vit D-Min (CALCIUM  1200 PO) Take 1 capsule by mouth daily.     carvedilol  (COREG ) 6.25 MG tablet TAKE 1 TABLET BY MOUTH TWICE A DAY 180 tablet 3   cholecalciferol (VITAMIN D3) 25 MCG (1000 UNIT) tablet Take 1,000 Units by mouth daily.     colchicine  0.6 MG tablet Take 1-2 tablets (0.6-1.2 mg total) by mouth See admin instructions. TAKE 2 TABLETS ONE TIME. THEN  TAKE 1 TABLET AFTER ONE HOUR. THEN TAKE 1 TABLET DAILY FOR 5 DAYS. (Patient taking differently: Take 0.6-1.2 mg by mouth as needed. TAKE 2 TABLETS ONE TIME. THEN TAKE 1 TABLET AFTER ONE HOUR. THEN TAKE 1 TABLET DAILY FOR 5 DAYS.)     empagliflozin  (JARDIANCE ) 10 MG TABS tablet TAKE 1 TABLET BY MOUTH EVERY DAY 90 tablet 3   ezetimibe  (ZETIA ) 10 MG tablet TAKE 1 TABLET BY MOUTH EVERY DAY 90 tablet 1   furosemide  (LASIX ) 40 MG tablet Take 1 tablet (40 mg total) by mouth daily. 90 tablet 3   Multiple Vitamin (MULTIVITAMIN) tablet Take 1 tablet by mouth daily.     OMEGA-3 FATTY ACIDS PO Take  1,200 mg by mouth daily.     sacubitril -valsartan  (ENTRESTO ) 24-26 MG TAKE 1 TABLET BY MOUTH TWICE A DAY 180 tablet 2   sildenafil  (REVATIO ) 20 MG tablet Take 3 to 5 tablets daily as needed 100 tablet 1   spironolactone  (ALDACTONE ) 25 MG tablet TAKE 1 TABLET BY MOUTH EVERYDAY AT BEDTIME 90 tablet 3   tamsulosin  (FLOMAX ) 0.4 MG CAPS capsule Take 0.4 mg by mouth daily.     tiotropium (SPIRIVA ) 18 MCG inhalation capsule INHALE 1 CAPSULE VIA HANDIHALER ONCE DAILY AT THE SAME TIME EVERY DAY 30 capsule 2   nitroGLYCERIN  (NITROSTAT ) 0.4 MG SL tablet DISSOLVE 1 TABLET UNDER THE TONGUE EVERY 5 MINUTES AS NEEDED FOR CHEST PAIN. MAX 3 DOSES/15 MIN (Patient not taking: Reported on 08/01/2024) 75 tablet 3   Varenicline  Tartrate, Starter, (CHANTIX  STARTING MONTH PAK) 0.5 MG X 11 & 1 MG X 42 TBPK Take one 0.5 mg tablet by mouth once daily for 3 days, then increase to one 0.5 mg tablet twice daily for 4 days, then increase to one 1 mg tablet twice daily. (Patient not taking: Reported on 07/17/2024) 53 each 0   No facility-administered medications prior to visit.     Allergies:   Patient has no known allergies.   Social History   Socioeconomic History   Marital status: Divorced    Spouse name: Not on file   Number of children: 2   Years of education: Not on file   Highest education level: Not on file  Occupational History    Occupation: [employer]  Tobacco Use   Smoking status: Every Day    Current packs/day: 0.50    Average packs/day: 0.5 packs/day for 35.0 years (17.5 ttl pk-yrs)    Types: Cigarettes    Passive exposure: Never   Smokeless tobacco: Never   Tobacco comments:    08/01/2024 Patient smokes 1/2 pack daily  Vaping Use   Vaping status: Never Used  Substance and Sexual Activity   Alcohol use: No    Alcohol/week: 0.0 standard drinks of alcohol   Drug use: No   Sexual activity: Not Currently  Other Topics Concern   Not on file  Social History Narrative   Not on file   Social Drivers of Health   Financial Resource Strain: Low Risk  (01/03/2024)   Overall Financial Resource Strain (CARDIA)    Difficulty of Paying Living Expenses: Not hard at all  Food Insecurity: No Food Insecurity (01/03/2024)   Hunger Vital Sign    Worried About Running Out of Food in the Last Year: Never true    Ran Out of Food in the Last Year: Never true  Transportation Needs: No Transportation Needs (01/03/2024)   PRAPARE - Administrator, Civil Service (Medical): No    Lack of Transportation (Non-Medical): No  Physical Activity: Inactive (01/03/2024)   Exercise Vital Sign    Days of Exercise per Week: 0 days    Minutes of Exercise per Session: 0 min  Stress: No Stress Concern Present (01/03/2024)   [employer] of Occupational Health - Occupational Stress Questionnaire    Feeling of Stress : Not at all  Social Connections: Moderately Isolated (01/03/2024)   Social Connection and Isolation Panel    Frequency of Communication with Friends and Family: More than three times a week    Frequency of Social Gatherings with Friends and Family: Once a week    Attends Religious Services: More than 4 times per year  Active Member of Clubs or Organizations: No    Attends Banker Meetings: Never    Marital Status: Divorced     Family History:  The patient's family history includes  CAD in his father; Cancer in his mother.   ROS:   Please see the history of present illness.    ROS All other systems reviewed and are negative.   PHYSICAL EXAM:   VS:  BP 104/62 (BP Location: Left Arm, Patient Position: Sitting, Cuff Size: Normal)   Pulse (!) 53   Ht 6' 1 (1.854 m)   Wt 205 lb 11.2 oz (93.3 kg)   SpO2 97%   BMI 27.14 kg/m    GENERAL:  Well appearing WM in NAD HEENT:  PERRL, EOMI, sclera are clear. Oropharynx is clear. NECK:  No jugular venous distention, carotid upstroke brisk and symmetric, no bruits, no thyromegaly or adenopathy LUNGS:  Clear to auscultation bilaterally CHEST:  Unremarkable HEART:  RRR,  PMI not displaced or sustained,S1 and S2 within normal limits, no S3, no S4: no clicks, no rubs, no murmurs ABD:  Soft, nontender. BS +, no masses or bruits. No hepatomegaly, no splenomegaly EXT:  2 + pulses throughout, no edema, no cyanosis no clubbing SKIN:  Warm and dry.  No rashes NEURO:  Alert and oriented x 3. Cranial nerves II through XII intact. PSYCH:  Cognitively intact    Wt Readings from Last 3 Encounters:  08/01/24 205 lb 11.2 oz (93.3 kg)  07/17/24 206 lb (93.4 kg)  07/18/23 223 lb (101.2 kg)      Studies/Labs Reviewed:   EKG:  EKG is not ordered today.     Recent Labs: No results found for requested labs within last 365 days.   Lipid Panel    Component Value Date/Time   CHOL 99 (L) 05/29/2023 0927   TRIG 100 05/29/2023 0927   HDL 34 (L) 05/29/2023 0927   CHOLHDL 2.9 05/29/2023 0927   CHOLHDL 4.3 01/04/2021 0236   VLDL 34 01/04/2021 0236   LDLCALC 46 05/29/2023 0927    Additional studies/ records that were reviewed today include:   Pulmonary tests PFT 10/03/2017>> FEV1 2.57 (65%) TLC, 6.93 (91%), DLCO 60% PFT 08/31/15 >> FEV1 3.04 (75%), FEV1% 67, TLC 7.98 (104%), DLCO 61% CT chest 08/31/17 >> severe CAD, TAA 4.1 cm, mod/severe centrilobular emphysema, multiple nodules 2-3 mm  Cath 08/30/2017 FINDINGS: Stable native  coronary artery disease with 50% distal LMCA and chronic total occlusion of ostial RCA. Distal RCA fills via left-to-right collaterals. Widely patent LIMA to LAD and SVG to ramus. Multiple abnormal vessels arising from the LIMA graft. Mediastinal mass is a consideration. Normal left ventricular filling pressure. Moderate to severely reduced left ventricular contraction.   RECOMMENDATIONS: Recommend CT chest to evaluate for intrathoracic mass. Continue medical therapy and secondary prevention of CAD.  Cath: 01/04/21   LM lesion is 50% stenosed. Patent LIMA to LAD. Patent SVG to ramus. 2nd Diag lesion is 70% stenosed. Mid RCA to Dist RCA lesion is 100% stenosed. Prox RCA to Mid RCA lesion is 100% stenosed. Ost RCA to Prox RCA lesion is 95% stenosed. Ost Cx lesion is 80% stenosed. This territory is not bypassed, but it is a relatively small vessel. Unable to advance balloon after successfully guiding the wire though the ramus graft and retrograde to the ostial circumflex and across the lesion. The left ventricular ejection fraction is 25-35% by visual estimate. There is moderate to severe left ventricular systolic dysfunction. LV end diastolic  pressure is normal. There is no aortic valve stenosis.   Medical therapy for CAD.  Unsuccessful attempt at PCI of ostial circumflex.  Restart IV heparin  8 hours post sheath pull.  OK to start DOAC tomorrow for atrial fibrillation.  He was loaded with Plavix  but will not continue if he is starting a DOAC.  If he is not starting a DOAC, DAPT would be reasonable for his CAD.     Diagnostic Dominance: Right     Echo: 01/04/21   IMPRESSIONS     1. There is no left ventricular thrombus (Definity  contrast was used).  There is apical dyskinesis. There is severe inferior wall hypokinesis and  inferoseptal akinesis.. Left ventricular ejection fraction, by estimation,  is 25 to 30%. The left ventricle  has severely decreased function. The left ventricle  demonstrates regional  wall motion abnormalities (see scoring diagram/findings for description).  The left ventricular internal cavity size was moderately to severely  dilated. Left ventricular diastolic  parameters are consistent with Grade I diastolic dysfunction (impaired  relaxation).   2. Right ventricular systolic function is normal. The right ventricular  size is normal. There is normal pulmonary artery systolic pressure. The  estimated right ventricular systolic pressure is 23.2 mmHg.   3. Left atrial size was severely dilated.   4. The mitral valve is normal in structure. Trivial mitral valve  regurgitation.   5. The aortic valve is tricuspid. There is mild calcification of the  aortic valve. There is mild thickening of the aortic valve. Aortic valve  regurgitation is not visualized. Mild aortic valve sclerosis is present,  with no evidence of aortic valve  stenosis.   6. Aortic dilatation noted. There is mild dilatation of the aortic root,  measuring 39 mm.   7. The inferior vena cava is normal in size with greater than 50%  respiratory variability, suggesting right atrial pressure of 3 mmHg.    ASSESSMENT:    No diagnosis found.    PLAN:   1. CAD s/p CABG x 2, multiple PCI. Patent LIMA to the LAD, SVG to ramus CTO of RCA complicated by perforation, tamponade, and pericarditis Unsuccessful PCI of Cx - 01/04/21- I personally reviewed films. The angulation into the LCx is not favorable for PCI and it looks like the LCx is adequately supplied by the SVG to ramus - he has minimal chest pain. Will resume Imdur . - continue ASA     2. Paroxysmal atrial fibrillation - started on eliquis  5 mg BID-  This patients CHA2DS2-VASc Score and unadjusted Ischemic Stroke Rate (% per year) is equal to 3.2 % stroke rate/year from a score of 3 (CHF, CAD, HTN)     3.  Chronic systolic and diastolic heart failure - echo with EF 30-35% - on Entresto  at maximally tolerated dose due to BP -  will continue  lasix , on Coreg  - continue Jardiance      4. Hypertension- well controlled.  - continue meds     6. Hyperlipidemia with LDL goal < 70 - last LDL at goal - continue statin - zetia  was added at discharge    7. Smoker - encourage cessation.   8. AAA. S/p endovascular stent graft.      Medication Adjustments/Labs and Tests Ordered: Current medicines are reviewed at length with the patient today.  Concerns regarding medicines are outlined above.  Medication changes, Labs and Tests ordered today are listed in the Patient Instructions below. There are no Patient Instructions on file for this visit.  Signed, Lisvet Rasheed [place], MD  08/01/2024 10:55 AM    [facility] [address] [place], [place], [place]  [address] Phone: [phone]; Fax: [phone]

## 2024-08-01 ENCOUNTER — Encounter: Payer: Self-pay | Admitting: Cardiology

## 2024-08-01 ENCOUNTER — Ambulatory Visit: Attending: Cardiology | Admitting: Cardiology

## 2024-08-01 VITALS — BP 104/62 | HR 53 | Ht 73.0 in | Wt 205.7 lb

## 2024-08-01 DIAGNOSIS — I480 Paroxysmal atrial fibrillation: Secondary | ICD-10-CM | POA: Diagnosis not present

## 2024-08-01 DIAGNOSIS — Z95810 Presence of automatic (implantable) cardiac defibrillator: Secondary | ICD-10-CM | POA: Diagnosis not present

## 2024-08-01 DIAGNOSIS — Z951 Presence of aortocoronary bypass graft: Secondary | ICD-10-CM

## 2024-08-01 DIAGNOSIS — I5022 Chronic systolic (congestive) heart failure: Secondary | ICD-10-CM

## 2024-08-01 DIAGNOSIS — I255 Ischemic cardiomyopathy: Secondary | ICD-10-CM | POA: Diagnosis not present

## 2024-08-01 DIAGNOSIS — I2510 Atherosclerotic heart disease of native coronary artery without angina pectoris: Secondary | ICD-10-CM

## 2024-08-01 DIAGNOSIS — Z720 Tobacco use: Secondary | ICD-10-CM

## 2024-08-01 MED ORDER — ISOSORBIDE MONONITRATE ER 30 MG PO TB24: 30.0000 mg | ORAL_TABLET | Freq: Every day | ORAL | 3 refills | Status: AC

## 2024-08-01 NOTE — Patient Instructions (Signed)
 Medication Instructions:  Continue same medications *If you need a refill on your cardiac medications before your next appointment, please call your pharmacy*  Lab Work: Bmet,cbc,lipid and hepatic panels today   Testing/Procedures: None ordered  Follow-Up: At [facility], you and your health needs are our priority.  As part of our continuing mission to provide you with exceptional heart care, our providers are all part of one team.  This team includes your primary Cardiologist (physician) and Advanced Practice Providers or APPs (Physician Assistants and Nurse Practitioners) who all work together to provide you with the care you need, when you need it.  Your next appointment:  6 months   Call in Oct to schedule Feb appointment     Provider:  Dr.Jordan    We recommend signing up for the patient portal called MyChart.  Sign up information is provided on this After Visit Summary.  MyChart is used to connect with patients for Virtual Visits (Telemedicine).  Patients are able to view lab/test results, encounter notes, upcoming appointments, etc.  Non-urgent messages can be sent to your provider as well.   To learn more about what you can do with MyChart, go to [email or web address].

## 2024-08-14 ENCOUNTER — Encounter

## 2024-08-14 LAB — CUP PACEART REMOTE DEVICE CHECK
Date Time Interrogation Session: 20250910144952
Implantable Lead Connection Status: 753985
Implantable Lead Implant Date: 20150515
Implantable Lead Location: 753860
Implantable Lead Model: 365
Implantable Lead Serial Number: 10579264
Implantable Pulse Generator Implant Date: 20150515
Pulse Gen Model: 383594
Pulse Gen Serial Number: 60800912

## 2024-08-22 ENCOUNTER — Ambulatory Visit: Payer: Self-pay | Admitting: Internal Medicine

## 2024-08-22 ENCOUNTER — Other Ambulatory Visit: Payer: Self-pay | Admitting: Cardiology

## 2024-08-22 NOTE — Telephone Encounter (Signed)
 Prescription refill request for Eliquis  received. Indication:afib Last office visit:8/25 Scr:1.2  8/25 Age: 68 Weight:93.3  kg  Prescription refilled

## 2024-08-27 ENCOUNTER — Ambulatory Visit (HOSPITAL_BASED_OUTPATIENT_CLINIC_OR_DEPARTMENT_OTHER)
Admission: RE | Admit: 2024-08-27 | Discharge: 2024-08-27 | Disposition: A | Discharge status: Home / Self Care | Source: Ambulatory Visit | Attending: Vascular Surgery | Admitting: Vascular Surgery

## 2024-08-27 ENCOUNTER — Ambulatory Visit (INDEPENDENT_AMBULATORY_CARE_PROVIDER_SITE_OTHER): Admitting: Physician Assistant

## 2024-08-27 ENCOUNTER — Ambulatory Visit (HOSPITAL_COMMUNITY)
Admission: RE | Admit: 2024-08-27 | Discharge: 2024-08-27 | Disposition: A | Discharge status: Home / Self Care | Source: Ambulatory Visit | Attending: Vascular Surgery | Admitting: Vascular Surgery

## 2024-08-27 VITALS — BP 106/69 | HR 64 | Temp 98.1°F | Ht 73.0 in | Wt 203.9 lb

## 2024-08-27 DIAGNOSIS — I714 Abdominal aortic aneurysm, without rupture, unspecified: Secondary | ICD-10-CM | POA: Diagnosis not present

## 2024-08-27 DIAGNOSIS — I7143 Infrarenal abdominal aortic aneurysm, without rupture: Secondary | ICD-10-CM | POA: Insufficient documentation

## 2024-08-27 NOTE — Progress Notes (Signed)
 Office Note   History of Present Illness   Kerry Mason is a 68 y.o. ([birth year]) male who presents for follow up.  He has a history of EVAR on 08/18/2022 by Dr. Sheree.  He returns today for follow-up.  He has no complaints at today's visit.  He denies any new or worsening abdominal or back pain.  He continues to tolerate a normal diet without any nausea.  He denies any lower extremity claudication, rest pain, or tissue loss.  Current Outpatient Medications  Medication Sig Dispense Refill   allopurinol  (ZYLOPRIM ) 300 MG tablet TAKE 1 TABLET BY MOUTH 2 TIMES DAILY. 180 tablet 1   apixaban  (ELIQUIS ) 5 MG TABS tablet TAKE 1 TABLET BY MOUTH TWICE A DAY 60 tablet 5   Ascorbic Acid (VITAMIN C) 1000 MG tablet Take 1,000 mg by mouth daily.     aspirin  EC 81 MG tablet Take 81 mg by mouth daily.     atorvastatin  (LIPITOR ) 80 MG tablet TAKE 1 TABLET BY MOUTH EVERY DAY 90 tablet 3   budesonide -formoterol  (SYMBICORT ) 160-4.5 MCG/ACT inhaler Inhale 2 puffs into the lungs 2 (two) times daily. 1 each 11   Calcium  Carbonate-Vit D-Min (CALCIUM  1200 PO) Take 1 capsule by mouth daily.     carvedilol  (COREG ) 6.25 MG tablet TAKE 1 TABLET BY MOUTH TWICE A DAY 180 tablet 3   cholecalciferol (VITAMIN D3) 25 MCG (1000 UNIT) tablet Take 1,000 Units by mouth daily.     colchicine  0.6 MG tablet Take 1-2 tablets (0.6-1.2 mg total) by mouth See admin instructions. TAKE 2 TABLETS ONE TIME. THEN TAKE 1 TABLET AFTER ONE HOUR. THEN TAKE 1 TABLET DAILY FOR 5 DAYS. (Patient taking differently: Take 0.6-1.2 mg by mouth as needed. TAKE 2 TABLETS ONE TIME. THEN TAKE 1 TABLET AFTER ONE HOUR. THEN TAKE 1 TABLET DAILY FOR 5 DAYS.)     empagliflozin  (JARDIANCE ) 10 MG TABS tablet TAKE 1 TABLET BY MOUTH EVERY DAY 90 tablet 3   ezetimibe  (ZETIA ) 10 MG tablet TAKE 1 TABLET BY MOUTH EVERY DAY 90 tablet 1   furosemide  (LASIX ) 40 MG tablet Take 1 tablet (40 mg total) by mouth daily. 90 tablet 3   isosorbide  mononitrate (IMDUR ) 30 MG 24  hr tablet Take 1 tablet (30 mg total) by mouth daily. 90 tablet 3   Multiple Vitamin (MULTIVITAMIN) tablet Take 1 tablet by mouth daily.     nitroGLYCERIN  (NITROSTAT ) 0.4 MG SL tablet DISSOLVE 1 TABLET UNDER THE TONGUE EVERY 5 MINUTES AS NEEDED FOR CHEST PAIN. MAX 3 DOSES/15 MIN 75 tablet 3   OMEGA-3 FATTY ACIDS PO Take 1,200 mg by mouth daily.     sacubitril -valsartan  (ENTRESTO ) 24-26 MG TAKE 1 TABLET BY MOUTH TWICE A DAY 180 tablet 2   sildenafil  (REVATIO ) 20 MG tablet Take 3 to 5 tablets daily as needed 100 tablet 1   spironolactone  (ALDACTONE ) 25 MG tablet TAKE 1 TABLET BY MOUTH EVERYDAY AT BEDTIME 90 tablet 3   tamsulosin  (FLOMAX ) 0.4 MG CAPS capsule Take 0.4 mg by mouth daily.     tiotropium (SPIRIVA ) 18 MCG inhalation capsule INHALE 1 CAPSULE VIA HANDIHALER ONCE DAILY AT THE SAME TIME EVERY DAY 30 capsule 2   No current facility-administered medications for this visit.    REVIEW OF SYSTEMS (negative unless checked):   Cardiac:  []  Chest pain or chest pressure? []  Shortness of breath upon activity? []  Shortness of breath when lying flat? []  Irregular heart rhythm?  Vascular:  []  Pain in calf,  thigh, or hip brought on by walking? []  Pain in feet at night that wakes you up from your sleep? []  Blood clot in your veins? []  Leg swelling?  Pulmonary:  []  Oxygen at home? []  Productive cough? []  Wheezing?  Neurologic:  []  Sudden weakness in arms or legs? []  Sudden numbness in arms or legs? []  Sudden onset of difficult speaking or slurred speech? []  Temporary loss of vision in one eye? []  Problems with dizziness?  Gastrointestinal:  []  Blood in stool? []  Vomited blood?  Genitourinary:  []  Burning when urinating? []  Blood in urine?  Psychiatric:  []  Major depression  Hematologic:  []  Bleeding problems? []  Problems with blood clotting?  Dermatologic:  []  Rashes or ulcers?  Constitutional:  []  Fever or chills?  Ear/Nose/Throat:  []  Change in hearing? []  Nose  bleeds? []  Sore throat?  Musculoskeletal:  []  Back pain? []  Joint pain? []  Muscle pain?   Physical Examination   Vitals:   08/27/24 0917  BP: 106/69  Pulse: 64  Temp: 98.1 F (36.7 C)  TempSrc: Temporal  Weight: 203 lb 14.4 oz (92.5 kg)  Height: 6' 1 (1.854 m)   Body mass index is 26.9 kg/m.  General:  WDWN in NAD; vital signs documented above Gait: Not observed HENT: WNL, normocephalic Pulmonary: normal non-labored breathing , without rales, rhonchi,  wheezing Cardiac: Regular Abdomen: soft, NT, no masses Skin: without rashes Vascular Exam/Pulses: Brisk DP/PT/peroneal Doppler signals bilaterally Extremities: without ischemic changes, without gangrene , without cellulitis; without open wounds;  Musculoskeletal: no muscle wasting or atrophy  Neurologic: A&O X 3;  No focal weakness or paresthesias are detected Psychiatric:  The pt has Normal affect.   Non-Invasive Vascular Imaging   EVAR Duplex (08/27/2024) Current size: 5.22 cm Previous size: 5.34 cm (07/2023) R CIA: 1.64 cm L CIA: 1.74 cm  BLE Arterial Duplex (08/27/2024) No evidence of popliteal artery aneurysms bilaterally   Medical Decision Making   Melquisedec Journey is a 68 y.o. (1956/10/11) male who presents for surveillance of AAA  Based on this patient's duplex, his stent graft repair is patent without endoleak.  His AAA appears slightly smaller in size at 5.22 cm Bilateral lower extremity arterial duplex demonstrates no evidence of popliteal artery aneurysm bilaterally He denies any new or worsening abdominal or back pain.  He also denies any claudication, rest pain, or tissue loss On exam he has no abdominal tenderness.  His feet are well-perfused with brisk DP/PT/peroneal Doppler signals He can follow-up with our office in 1 year with repeat EVAR duplex   Ahmed Holster PA-C [facility] Office: [phone]  Clinic MD: Sheree

## 2024-08-29 NOTE — Progress Notes (Signed)
Remote ICD Transmission.

## 2024-09-05 ENCOUNTER — Other Ambulatory Visit: Payer: Self-pay | Admitting: Cardiology

## 2024-09-05 DIAGNOSIS — I255 Ischemic cardiomyopathy: Secondary | ICD-10-CM

## 2024-09-05 DIAGNOSIS — Z95810 Presence of automatic (implantable) cardiac defibrillator: Secondary | ICD-10-CM

## 2024-09-12 ENCOUNTER — Other Ambulatory Visit: Payer: Self-pay | Admitting: Cardiology

## 2024-09-12 DIAGNOSIS — I5022 Chronic systolic (congestive) heart failure: Secondary | ICD-10-CM

## 2024-09-15 ENCOUNTER — Ambulatory Visit

## 2024-09-18 ENCOUNTER — Ambulatory Visit: Payer: Self-pay | Admitting: Internal Medicine

## 2024-09-18 LAB — CUP PACEART REMOTE DEVICE CHECK
Battery Voltage: 4
Date Time Interrogation Session: 20251015123320
Implantable Lead Connection Status: 753985
Implantable Lead Implant Date: 20150515
Implantable Lead Location: 753860
Implantable Lead Model: 365
Implantable Lead Serial Number: 10579264
Implantable Pulse Generator Implant Date: 20150515
Pulse Gen Model: 383594
Pulse Gen Serial Number: 60800912

## 2024-09-28 ENCOUNTER — Telehealth: Payer: Self-pay | Admitting: Family Medicine

## 2024-09-28 DIAGNOSIS — M109 Gout, unspecified: Secondary | ICD-10-CM

## 2024-09-29 ENCOUNTER — Telehealth: Payer: Self-pay

## 2024-09-29 NOTE — Telephone Encounter (Signed)
 [employer] ICD w/DX lead alert:   Mean PVC/h intermittently above limit (> 100 PVC/h) Intermittently above limit since Sep 20, 2024, 1:32:30 AM - Last value 127 PVC/h measured on Sep 29, 2024, 1:32:30

## 2024-09-30 NOTE — Telephone Encounter (Signed)
Pt called back returning call.

## 2024-09-30 NOTE — Telephone Encounter (Signed)
 LM to return call and assess patient symptoms with increasing PVC burden.

## 2024-10-01 NOTE — Telephone Encounter (Signed)
 Call back received from Pt.  Per Pt he has felt good.  He is aware of his PVC's, but they are not distressing to him.  He is on carvedilol  6.25 mg PO BID.  He asks if he increases this medication if it will decrease his PVC's.  Advised his BP was too low to go up on the carvedilol .  Advised if he feels good then would continue currrent medication regimen.  All questions answered.  Advised Pt to call if he has increased palpitations or fatigue.

## 2024-10-16 ENCOUNTER — Ambulatory Visit: Attending: Internal Medicine

## 2024-10-16 DIAGNOSIS — I5022 Chronic systolic (congestive) heart failure: Secondary | ICD-10-CM

## 2024-10-17 LAB — CUP PACEART REMOTE DEVICE CHECK
Date Time Interrogation Session: 20251113071633
Implantable Lead Connection Status: 753985
Implantable Lead Implant Date: 20150515
Implantable Lead Location: 753860
Implantable Lead Model: 365
Implantable Lead Serial Number: 10579264
Implantable Pulse Generator Implant Date: 20150515
Pulse Gen Model: 383594
Pulse Gen Serial Number: 60800912

## 2024-10-19 ENCOUNTER — Ambulatory Visit: Payer: Self-pay | Admitting: Internal Medicine

## 2024-10-21 ENCOUNTER — Other Ambulatory Visit: Payer: Self-pay | Admitting: Family Medicine

## 2024-10-21 ENCOUNTER — Other Ambulatory Visit: Payer: Self-pay

## 2024-10-21 DIAGNOSIS — M109 Gout, unspecified: Secondary | ICD-10-CM

## 2024-10-21 MED ORDER — ALLOPURINOL 300 MG PO TABS: 300.0000 mg | ORAL_TABLET | Freq: Two times a day (BID) | ORAL | 1 refills | Status: AC

## 2024-10-21 NOTE — Telephone Encounter (Signed)
 Pt is requesting a refill for this medication. Pt pharmacy is [facility] [address]. Pt is now scheduled for his Physical on 11/24/24.

## 2024-10-21 NOTE — Progress Notes (Signed)
 Remote ICD Transmission

## 2024-11-16 ENCOUNTER — Ambulatory Visit: Attending: Internal Medicine

## 2024-11-17 ENCOUNTER — Ambulatory Visit

## 2024-11-18 LAB — CUP PACEART REMOTE DEVICE CHECK
Date Time Interrogation Session: 20251215121312
Implantable Lead Connection Status: 753985
Implantable Lead Implant Date: 20150515
Implantable Lead Location: 753860
Implantable Lead Model: 365
Implantable Lead Serial Number: 10579264
Implantable Pulse Generator Implant Date: 20150515
Pulse Gen Model: 383594
Pulse Gen Serial Number: 60800912

## 2024-11-21 ENCOUNTER — Ambulatory Visit: Payer: Self-pay | Admitting: Internal Medicine

## 2024-11-24 ENCOUNTER — Encounter: Admitting: Family Medicine

## 2024-12-17 ENCOUNTER — Ambulatory Visit: Attending: Cardiology

## 2024-12-18 ENCOUNTER — Ambulatory Visit

## 2024-12-18 LAB — CUP PACEART REMOTE DEVICE CHECK
Date Time Interrogation Session: 20260114072635
Implantable Lead Connection Status: 753985
Implantable Lead Implant Date: 20150515
Implantable Lead Location: 753860
Implantable Lead Model: 365
Implantable Lead Serial Number: 10579264
Implantable Pulse Generator Implant Date: 20150515
Pulse Gen Model: 383594
Pulse Gen Serial Number: 60800912

## 2024-12-20 ENCOUNTER — Other Ambulatory Visit: Payer: Self-pay | Admitting: Family Medicine

## 2025-01-17 ENCOUNTER — Ambulatory Visit

## 2025-01-19 ENCOUNTER — Ambulatory Visit

## 2025-01-22 ENCOUNTER — Ambulatory Visit: Payer: 59

## 2025-02-17 ENCOUNTER — Ambulatory Visit

## 2025-02-19 ENCOUNTER — Ambulatory Visit

## 2025-03-20 ENCOUNTER — Ambulatory Visit

## 2025-03-23 ENCOUNTER — Ambulatory Visit

## 2025-04-20 ENCOUNTER — Ambulatory Visit

## 2025-04-23 ENCOUNTER — Ambulatory Visit

## 2025-05-21 ENCOUNTER — Ambulatory Visit

## 2025-05-25 ENCOUNTER — Ambulatory Visit
